# Patient Record
Sex: Male | Born: 1945
Health system: Southern US, Community
[De-identification: ages and names within clinical notes are randomized; demographics above are authoritative.]

## PROBLEM LIST (undated history)

## (undated) DIAGNOSIS — I639 Cerebral infarction, unspecified: Secondary | ICD-10-CM

## (undated) DIAGNOSIS — C61 Malignant neoplasm of prostate: Secondary | ICD-10-CM

## (undated) DIAGNOSIS — K5790 Diverticulosis of intestine, part unspecified, without perforation or abscess without bleeding: Secondary | ICD-10-CM

## (undated) DIAGNOSIS — I69351 Hemiplegia and hemiparesis following cerebral infarction affecting right dominant side: Secondary | ICD-10-CM

## (undated) DIAGNOSIS — I739 Peripheral vascular disease, unspecified: Secondary | ICD-10-CM

## (undated) DIAGNOSIS — Z720 Tobacco use: Secondary | ICD-10-CM

## (undated) DIAGNOSIS — J90 Pleural effusion, not elsewhere classified: Secondary | ICD-10-CM

## (undated) DIAGNOSIS — Z1612 Extended spectrum beta lactamase (ESBL) resistance: Secondary | ICD-10-CM

## (undated) DIAGNOSIS — I2510 Atherosclerotic heart disease of native coronary artery without angina pectoris: Secondary | ICD-10-CM

## (undated) DIAGNOSIS — K219 Gastro-esophageal reflux disease without esophagitis: Secondary | ICD-10-CM

## (undated) DIAGNOSIS — F411 Generalized anxiety disorder: Secondary | ICD-10-CM

## (undated) DIAGNOSIS — I610 Nontraumatic intracerebral hemorrhage in hemisphere, subcortical: Secondary | ICD-10-CM

## (undated) DIAGNOSIS — I4891 Unspecified atrial fibrillation: Secondary | ICD-10-CM

## (undated) DIAGNOSIS — I214 Non-ST elevation (NSTEMI) myocardial infarction: Secondary | ICD-10-CM

## (undated) DIAGNOSIS — K250 Acute gastric ulcer with hemorrhage: Secondary | ICD-10-CM

## (undated) DIAGNOSIS — E785 Hyperlipidemia, unspecified: Secondary | ICD-10-CM

## (undated) DIAGNOSIS — I38 Endocarditis, valve unspecified: Secondary | ICD-10-CM

## (undated) DIAGNOSIS — E43 Unspecified severe protein-calorie malnutrition: Secondary | ICD-10-CM

## (undated) DIAGNOSIS — K449 Diaphragmatic hernia without obstruction or gangrene: Secondary | ICD-10-CM

## (undated) DIAGNOSIS — F69 Unspecified disorder of adult personality and behavior: Secondary | ICD-10-CM

## (undated) DIAGNOSIS — I69391 Dysphagia following cerebral infarction: Secondary | ICD-10-CM

## (undated) DIAGNOSIS — J9601 Acute respiratory failure with hypoxia: Secondary | ICD-10-CM

## (undated) DIAGNOSIS — I10 Essential (primary) hypertension: Secondary | ICD-10-CM

## (undated) DIAGNOSIS — G9340 Encephalopathy, unspecified: Secondary | ICD-10-CM

## (undated) DIAGNOSIS — N400 Enlarged prostate without lower urinary tract symptoms: Secondary | ICD-10-CM

## (undated) DIAGNOSIS — I69320 Aphasia following cerebral infarction: Secondary | ICD-10-CM

## (undated) HISTORY — DX: Unspecified atrial fibrillation: I48.91

## (undated) HISTORY — PX: APPENDECTOMY: SHX54

## (undated) HISTORY — DX: Pleural effusion, not elsewhere classified: J90

## (undated) HISTORY — DX: Endocarditis, valve unspecified: I38

## (undated) HISTORY — DX: Benign prostatic hyperplasia without lower urinary tract symptoms: N40.0

## (undated) HISTORY — PX: BACK SURGERY: SHX140

---

## 2000-03-20 ENCOUNTER — Inpatient Hospital Stay (HOSPITAL_COMMUNITY): Admission: AD | Admit: 2000-03-20 | Discharge: 2000-03-23 | Payer: Self-pay | Admitting: Cardiology

## 2000-03-29 ENCOUNTER — Ambulatory Visit (HOSPITAL_COMMUNITY): Admission: RE | Admit: 2000-03-29 | Discharge: 2000-03-30 | Payer: Self-pay | Admitting: Cardiology

## 2000-11-11 ENCOUNTER — Inpatient Hospital Stay (HOSPITAL_COMMUNITY): Admission: RE | Admit: 2000-11-11 | Discharge: 2000-11-14 | Payer: Self-pay | Admitting: *Deleted

## 2007-02-01 ENCOUNTER — Ambulatory Visit: Admission: RE | Admit: 2007-02-01 | Discharge: 2007-05-02 | Payer: Self-pay | Admitting: Radiation Oncology

## 2012-08-13 ENCOUNTER — Inpatient Hospital Stay (HOSPITAL_COMMUNITY)
Admission: AD | Admit: 2012-08-13 | Discharge: 2012-08-26 | DRG: 234 | Disposition: A | Discharge status: Home Health | Payer: Medicare Other | Source: Other Acute Inpatient Hospital | Attending: Thoracic Surgery (Cardiothoracic Vascular Surgery) | Admitting: Thoracic Surgery (Cardiothoracic Vascular Surgery)

## 2012-08-13 ENCOUNTER — Encounter (HOSPITAL_COMMUNITY): Payer: Self-pay | Admitting: Physician Assistant

## 2012-08-13 DIAGNOSIS — D696 Thrombocytopenia, unspecified: Secondary | ICD-10-CM | POA: Diagnosis not present

## 2012-08-13 DIAGNOSIS — Z9861 Coronary angioplasty status: Secondary | ICD-10-CM

## 2012-08-13 DIAGNOSIS — I4891 Unspecified atrial fibrillation: Secondary | ICD-10-CM | POA: Diagnosis not present

## 2012-08-13 DIAGNOSIS — M810 Age-related osteoporosis without current pathological fracture: Secondary | ICD-10-CM | POA: Diagnosis present

## 2012-08-13 DIAGNOSIS — F17200 Nicotine dependence, unspecified, uncomplicated: Secondary | ICD-10-CM | POA: Diagnosis present

## 2012-08-13 DIAGNOSIS — K449 Diaphragmatic hernia without obstruction or gangrene: Secondary | ICD-10-CM | POA: Diagnosis present

## 2012-08-13 DIAGNOSIS — R404 Transient alteration of awareness: Secondary | ICD-10-CM | POA: Diagnosis not present

## 2012-08-13 DIAGNOSIS — E782 Mixed hyperlipidemia: Secondary | ICD-10-CM | POA: Diagnosis present

## 2012-08-13 DIAGNOSIS — I214 Non-ST elevation (NSTEMI) myocardial infarction: Principal | ICD-10-CM | POA: Diagnosis present

## 2012-08-13 DIAGNOSIS — I10 Essential (primary) hypertension: Secondary | ICD-10-CM | POA: Diagnosis present

## 2012-08-13 DIAGNOSIS — E785 Hyperlipidemia, unspecified: Secondary | ICD-10-CM | POA: Diagnosis present

## 2012-08-13 DIAGNOSIS — Z951 Presence of aortocoronary bypass graft: Secondary | ICD-10-CM

## 2012-08-13 DIAGNOSIS — R32 Unspecified urinary incontinence: Secondary | ICD-10-CM | POA: Diagnosis not present

## 2012-08-13 DIAGNOSIS — IMO0001 Reserved for inherently not codable concepts without codable children: Secondary | ICD-10-CM

## 2012-08-13 DIAGNOSIS — K219 Gastro-esophageal reflux disease without esophagitis: Secondary | ICD-10-CM | POA: Diagnosis present

## 2012-08-13 DIAGNOSIS — E119 Type 2 diabetes mellitus without complications: Secondary | ICD-10-CM | POA: Diagnosis present

## 2012-08-13 DIAGNOSIS — E876 Hypokalemia: Secondary | ICD-10-CM | POA: Diagnosis not present

## 2012-08-13 DIAGNOSIS — R079 Chest pain, unspecified: Secondary | ICD-10-CM

## 2012-08-13 DIAGNOSIS — K560 Paralytic ileus: Secondary | ICD-10-CM | POA: Diagnosis not present

## 2012-08-13 DIAGNOSIS — R5381 Other malaise: Secondary | ICD-10-CM | POA: Diagnosis not present

## 2012-08-13 DIAGNOSIS — I059 Rheumatic mitral valve disease, unspecified: Secondary | ICD-10-CM | POA: Diagnosis present

## 2012-08-13 DIAGNOSIS — J90 Pleural effusion, not elsewhere classified: Secondary | ICD-10-CM | POA: Diagnosis not present

## 2012-08-13 DIAGNOSIS — I2510 Atherosclerotic heart disease of native coronary artery without angina pectoris: Secondary | ICD-10-CM | POA: Diagnosis present

## 2012-08-13 HISTORY — DX: Gastro-esophageal reflux disease without esophagitis: K21.9

## 2012-08-13 HISTORY — DX: Essential (primary) hypertension: I10

## 2012-08-13 HISTORY — DX: Hyperlipidemia, unspecified: E78.5

## 2012-08-13 HISTORY — DX: Tobacco use: Z72.0

## 2012-08-13 HISTORY — DX: Peripheral vascular disease, unspecified: I73.9

## 2012-08-13 HISTORY — DX: Diaphragmatic hernia without obstruction or gangrene: K44.9

## 2012-08-13 HISTORY — DX: Malignant neoplasm of prostate: C61

## 2012-08-13 HISTORY — DX: Atherosclerotic heart disease of native coronary artery without angina pectoris: I25.10

## 2012-08-13 LAB — GLUCOSE, CAPILLARY
Glucose-Capillary: 99 mg/dL (ref 70–99)
Glucose-Capillary: 155 mg/dL — ABNORMAL HIGH (ref 70–99)

## 2012-08-13 LAB — MRSA PCR SCREENING: MRSA by PCR: NEGATIVE

## 2012-08-13 MED ORDER — ONDANSETRON HCL 4 MG/2ML IJ SOLN: 4.0000 mg | Freq: Four times a day (QID) | INTRAMUSCULAR | Status: DC | PRN

## 2012-08-13 MED ORDER — INSULIN ASPART 100 UNIT/ML ~~LOC~~ SOLN: 0.0000 [IU] | Freq: Every day | SUBCUTANEOUS | Status: DC

## 2012-08-13 MED ORDER — SODIUM CHLORIDE 0.9 % IV SOLN
INTRAVENOUS | Status: DC
  Administered 2012-08-14: 05:00:00 via INTRAVENOUS

## 2012-08-13 MED ORDER — SODIUM CHLORIDE 0.9 % IJ SOLN: 3.0000 mL | INTRAMUSCULAR | Status: DC | PRN

## 2012-08-13 MED ORDER — ASPIRIN 81 MG PO CHEW
324.0000 mg | CHEWABLE_TABLET | ORAL | Status: AC
  Administered 2012-08-14: 324 mg via ORAL
  Filled 2012-08-13: qty 4

## 2012-08-13 MED ORDER — ATORVASTATIN CALCIUM 80 MG PO TABS
80.0000 mg | ORAL_TABLET | Freq: Every day | ORAL | Status: DC
  Administered 2012-08-13 – 2012-08-25 (×9): 80 mg via ORAL
  Filled 2012-08-13 (×14): qty 1

## 2012-08-13 MED ORDER — METOPROLOL TARTRATE 25 MG PO TABS
25.0000 mg | ORAL_TABLET | Freq: Two times a day (BID) | ORAL | Status: DC
  Administered 2012-08-13: 25 mg via ORAL
  Filled 2012-08-13 (×3): qty 1

## 2012-08-13 MED ORDER — ACETAMINOPHEN 325 MG PO TABS
650.0000 mg | ORAL_TABLET | ORAL | Status: DC | PRN
  Administered 2012-08-14 – 2012-08-15 (×2): 650 mg via ORAL
  Filled 2012-08-13 (×2): qty 2

## 2012-08-13 MED ORDER — NITROGLYCERIN IN D5W 200-5 MCG/ML-% IV SOLN
3.0000 ug/min | INTRAVENOUS | Status: DC
  Administered 2012-08-13: 5 ug/min via INTRAVENOUS

## 2012-08-13 MED ORDER — INSULIN ASPART 100 UNIT/ML ~~LOC~~ SOLN
0.0000 [IU] | Freq: Three times a day (TID) | SUBCUTANEOUS | Status: DC
  Administered 2012-08-14 – 2012-08-15 (×2): 2 [IU] via SUBCUTANEOUS

## 2012-08-13 MED ORDER — NITROGLYCERIN 0.4 MG SL SUBL: 0.4000 mg | SUBLINGUAL_TABLET | SUBLINGUAL | Status: DC | PRN

## 2012-08-13 MED ORDER — LISINOPRIL 10 MG PO TABS
10.0000 mg | ORAL_TABLET | Freq: Every day | ORAL | Status: DC
  Filled 2012-08-13: qty 1

## 2012-08-13 MED ORDER — ASPIRIN EC 81 MG PO TBEC
81.0000 mg | DELAYED_RELEASE_TABLET | Freq: Every day | ORAL | Status: DC
  Administered 2012-08-15: 81 mg via ORAL
  Filled 2012-08-13 (×3): qty 1

## 2012-08-13 MED ORDER — INSULIN GLARGINE 100 UNIT/ML ~~LOC~~ SOLN
40.0000 [IU] | Freq: Every day | SUBCUTANEOUS | Status: DC
  Administered 2012-08-13: 40 [IU] via SUBCUTANEOUS
  Administered 2012-08-14: 21:00:00 via SUBCUTANEOUS
  Administered 2012-08-15: 40 [IU] via SUBCUTANEOUS

## 2012-08-13 MED ORDER — SODIUM CHLORIDE 0.9 % IJ SOLN
3.0000 mL | Freq: Two times a day (BID) | INTRAMUSCULAR | Status: DC
  Administered 2012-08-13: 3 mL via INTRAVENOUS

## 2012-08-13 MED ORDER — SODIUM CHLORIDE 0.9 % IV SOLN: 250.0000 mL | INTRAVENOUS | Status: DC | PRN

## 2012-08-13 MED ORDER — HEPARIN (PORCINE) IN NACL 100-0.45 UNIT/ML-% IJ SOLN
1200.0000 [IU]/h | INTRAMUSCULAR | Status: DC
  Administered 2012-08-14: 1200 [IU]/h via INTRAVENOUS
  Filled 2012-08-13: qty 250

## 2012-08-13 MED ORDER — HEPARIN BOLUS VIA INFUSION
1500.0000 [IU] | Freq: Once | INTRAVENOUS | Status: AC
  Administered 2012-08-13: 1500 [IU] via INTRAVENOUS
  Filled 2012-08-13: qty 1500

## 2012-08-13 MED ORDER — TAMSULOSIN HCL 0.4 MG PO CAPS
0.4000 mg | ORAL_CAPSULE | Freq: Every day | ORAL | Status: DC
  Administered 2012-08-14 – 2012-08-15 (×2): 0.4 mg via ORAL
  Filled 2012-08-13 (×3): qty 1

## 2012-08-13 MED ORDER — SODIUM CHLORIDE 0.9 % IV SOLN
INTRAVENOUS | Status: DC
  Administered 2012-08-13: 16:00:00 via INTRAVENOUS

## 2012-08-13 NOTE — Progress Notes (Addendum)
ANTICOAGULATION CONSULT NOTE - Initial Consult  Pharmacy Consult for heparin Indication: chest pain/ACS  No Known Allergies  Patient Measurements: Height: 5\' 9"  (175.3 cm) Weight: 204 lb 12.9 oz (92.9 kg) IBW/kg (Calculated) : 70.7  Heparin Dosing Weight: 90 kg  Vital Signs: Temp: 97.8 F (36.6 C) (09/02 1454) Temp src: Oral (09/02 1454) BP: 138/84 mmHg (09/02 1515) Pulse Rate: 85  (09/02 1515)  Labs: No results found for this basename: HGB:2,HCT:3,PLT:3,APTT:3,LABPROT:3,INR:3,HEPARINUNFRC:3,CREATININE:3,CKTOTAL:3,CKMB:3,TROPONINI:3 in the last 72 hours  CrCl is unknown because no creatinine reading has been taken.   Medical History: Past Medical History  Diagnosis Date  . CAD (coronary artery disease)   . HTN (hypertension)   . HLD (hyperlipidemia)   . GERD (gastroesophageal reflux disease)   . Hiatal hernia   . Prostate cancer     Status post radiation treatment for  . Tobacco abuse   . Myocardial infarction   . Diabetes mellitus   . Peripheral vascular disease   . Anginal pain     Medications:  Scheduled:    . aspirin EC  81 mg Oral Daily  . atorvastatin  80 mg Oral QHS  . insulin aspart  0-15 Units Subcutaneous TID WC  . insulin aspart  0-5 Units Subcutaneous QHS  . insulin glargine  40 Units Subcutaneous QHS  . lisinopril  10 mg Oral Daily  . metoprolol tartrate  25 mg Oral BID  . Tamsulosin HCl  0.4 mg Oral QPC supper    Assessment: 66 yo male transferred from [facility] with NSTEMI. Pharmacy consulted to manage heparin. Pt received heparin 5000 unit bolus at ~ 11:00 at [facility] and heparin has been infusing at 1000 units/hr. Baseline labs from [facility] include INR 1.1, Hgb 16.8, Plt 179.   Goal of Therapy:  Heparin level 0.3-0.7 units/ml Monitor platelets by anticoagulation protocol: Yes   Plan:  1. Continue IV heparin at 1000 units/hr.  2. Heparin level at 17:00.  3. Daily CBC, heparin level.  Emeline Gins 08/13/2012,4:21  PM   Addendum: Heparin level (0.23) is below-goal on 1000 units/hr. Heparin IV bolus of 1500 units x 1, then increase IV infusion to 1200 units/hr. Heparin level in 6 hours.   Lorre Munroe, PharmD 08/13/12, 05:45 PM.

## 2012-08-13 NOTE — H&P (Signed)
History and Physical  Patient ID: YAXIEL MINNIE MRN: [id number], SOB: 1946-08-20 66 y.o. Date of Encounter: 08/13/2012, 3:22 PM  Primary Physician: Selinda Flavin, MD   HPI: 66 year-old male, history of CAD, status post NSTEMI treated with PCI/stenting 100% distal RCA, April 2001, status post acute inferior MI/emergent thrombectomy/stenting 100% mid RCA, November 2001, with residual nonobstructive left sided disease, normal LVF, HTN, HLD, and IDDM. Patient has not had any regular cardiology followup, since being treated for his MI in 2001.  Patient presented to [facility] ED earlier this morning with complaint of recurrent angina pectoris over the past few days. His first episode occurred this past Wednesday, after he had changed a tire. The episode was associated with nausea and vomiting, and the anterior chest discomfort was reminiscent of his MI presentation, although not as severe and with no associated LUE radiation. This morning he had a similar episode of chest discomfort, although without the associated nausea/vomiting, but after having had a biscuit for breakfast earlier this morning. He did not have any NTG at home, but did take some TUMS, believing this might relieve his indigestion. His symptoms persisted, and he was taken to the ED by his wife. He was treated with 4 baby aspirin, 3 NTG tablets, nitro paste, and subsequently placed on both intravenous NTG and heparin. Initial cardiac markers were abnormal with a troponin I of 0.20. 12 lead EKG indicated NSR with evidence of old inferior MI, but no acute changes.  Patient is currently hemodynamically stable and pain-free.  Past Medical History  Diagnosis Date  . CAD (coronary artery disease)   . HTN (hypertension)   . HLD (hyperlipidemia)   . GERD (gastroesophageal reflux disease)   . Hiatal hernia   . Prostate cancer     Status post radiation treatment for  . Tobacco abuse      Past Surgical History  Procedure Date  .  Back surgery   . Appendectomy      Home Meds: Prior to Admission medications   Medication Sig Start Date End Date Taking? Authorizing Provider  atorvastatin (LIPITOR) 10 MG tablet Take 10 mg by mouth at bedtime.   Yes Historical Provider, MD  insulin glargine (LANTUS) 100 UNIT/ML injection Inject 40 Units into the skin at bedtime.   Yes Historical Provider, MD  lisinopril (PRINIVIL,ZESTRIL) 10 MG tablet Take 10 mg by mouth daily.   Yes Historical Provider, MD  metFORMIN (GLUCOPHAGE-XR) 500 MG 24 hr tablet Take 1,000 mg by mouth 2 (two) times daily.    Yes Historical Provider, MD  Tamsulosin HCl (FLOMAX) 0.4 MG CAPS Take 0.4 mg by mouth daily after supper.   Yes Historical Provider, MD    Allergies: No Known Allergies  History   Social History  . Marital Status: Married    Spouse Name: N/A    Number of Children: N/A  . Years of Education: N/A   Occupational History  . Not on file.   Social History Main Topics  . Smoking status: Not on file  . Smokeless tobacco: Not on file  . Alcohol Use: Not on file  . Drug Use: Not on file  . Sexually Active: Not on file   Other Topics Concern  . Not on file   Social History Narrative  . No narrative on file     FH: Noncontributory for premature CAD  Review of Systems: General: negative for chills, fever, night sweats or weight changes.  Cardiovascular: negative for chest pain, edema, orthopnea,  palpitations, paroxysmal nocturnal dyspnea, shortness of breath or dyspnea on exertion Dermatological: negative for rash Respiratory: negative for cough or wheezing Urologic: negative for hematuria Abdominal: negative for nausea, vomiting, diarrhea, bright red blood per rectum, melena, or hematemesis Neurologic: negative for visual changes, syncope, or dizziness All other systems reviewed and are otherwise negative except as noted above.  Labs:   No results found for this basename: WBC, HGB, HCT, MCV, PLT   No results found for this  basename: NA,K,CL,CO2,BUN,CREATININE,CALCIUM,LABALBU,PROT,BILITOT,ALKPHOS,ALT,AST,GLUCOSE in the last 168 hours No results found for this basename: CKTOTAL:4,CKMB:4,TROPONINI:4 in the last 72 hours No results found for this basename: CHOL, HDL, LDLCALC, TRIG   No results found for this basename: DDIMER    Radiology/Studies:  No results found.   EKG: NSR at 83 bpm; normal axis; old IMI; no acute changes  Physical Exam:  Blood pressure 138/84, pulse 85, temperature 97.8 F (36.6 C), temperature source Oral, resp. rate 22, height 5\' 9"  (1.753 m), weight 204 lb 12.9 oz (92.9 kg), SpO2 97.00%. General: Well developed, well nourished, in no acute distress. Head: Normocephalic, atraumatic, sclera non-icteric, no xanthomas, nares are without discharge.  Neck: Negative for carotid bruits. JVD not elevated. Lungs: Clear bilaterally to auscultation without wheezes, rales, or rhonchi. Breathing is unlabored. Heart: RRR with S1 S2. No murmurs, rubs, or gallops appreciated. Abdomen: Soft, non-tender, non-distended with normoactive bowel sounds. No hepatomegaly. No rebound/guarding. No obvious abdominal masses. Msk:  Strength and tone appear normal for age. Extremities: No clubbing or cyanosis. No edema.  Distal pedal pulses are 2+ and equal bilaterally. Neuro: Alert and oriented X 3. Moves all extremities spontaneously. Psych:  Responds to questions appropriately with a normal affect.     ASSESSMENT AND PLAN:  1 NSTEMI  2 CAD  - NSTEMI/stenting 100% distal RCA, 03/2000  - Acute inferior MI/emergent thrombectomy/stenting 100% mid RCA, 10/2000  - EF 51%  3 HTN  4 HLD  5 IDDM  PLAN: Recommendation is to proceed with diagnostic coronary angiography and probable PCI in the a.m. Patient presents with typical symptoms and abnormal cardiac markers, consistent with NSTEMI. He is currently hemodynamically stable and pain-free. Plan is to continue current medication regimen with IV NTG and heparin,  in addition to ASA, beta blocker, and statin. Patient will be covered with SSI, in addition to home dose Lantus. Metformin will be placed on hold. Risks/benefits of the procedure were reviewed, in conjunction with Dr. Valera Castle.   Signed, SERPE, EUGENE PA-C 08/13/2012, 3:22 PM   I have taken a history, reviewed medications, allergies, PMH, SH, FH, and reviewed ROS and examined the patient.  I agree with the assessment and plan. Discussed with patient and wife. Will not load with additional antiplatelet drug other than ASA.  Joliana Claflin C. Daleen Squibb, MD, [facility] Pager:  [phone]

## 2012-08-13 NOTE — Progress Notes (Signed)
CRITICAL VALUE ALERT  Critical value received:  Troponin 0.49 Date of notification:  [id number]  Time of notification:  1755  Critical value read back:yes  Nurse who received alert:  Deneise Lever, RN  MD notified (1st page):Dr. Valera Castle Time of first page:  1805  MD notified (2nd page):  Time of second page:  Responding MD: Dr. Valera Castle  Time MD responded:  [phone]

## 2012-08-14 ENCOUNTER — Encounter (HOSPITAL_COMMUNITY)
Admission: AD | Disposition: A | Discharge status: Home Health | Payer: Self-pay | Source: Other Acute Inpatient Hospital | Attending: Thoracic Surgery (Cardiothoracic Vascular Surgery)

## 2012-08-14 ENCOUNTER — Other Ambulatory Visit: Payer: Self-pay | Admitting: *Deleted

## 2012-08-14 DIAGNOSIS — I2510 Atherosclerotic heart disease of native coronary artery without angina pectoris: Secondary | ICD-10-CM

## 2012-08-14 HISTORY — PX: LEFT HEART CATHETERIZATION WITH CORONARY ANGIOGRAM: SHX5451

## 2012-08-14 LAB — CBC
Hemoglobin: 16.3 g/dL (ref 13.0–17.0)
RBC: 5.28 MIL/uL (ref 4.22–5.81)

## 2012-08-14 LAB — GLUCOSE, CAPILLARY

## 2012-08-14 LAB — HEPARIN LEVEL (UNFRACTIONATED)
Heparin Unfractionated: 0.43 IU/mL (ref 0.30–0.70)
Heparin Unfractionated: 0.32 IU/mL (ref 0.30–0.70)

## 2012-08-14 LAB — LIPID PANEL
Cholesterol: 141 mg/dL (ref 0–200)
HDL: 33 mg/dL — ABNORMAL LOW (ref >39)
Total CHOL/HDL Ratio: 4.3 RATIO

## 2012-08-14 SURGERY — LEFT HEART CATHETERIZATION WITH CORONARY ANGIOGRAM
Anesthesia: LOCAL

## 2012-08-14 MED ORDER — METOPROLOL TARTRATE 25 MG PO TABS
25.0000 mg | ORAL_TABLET | Freq: Two times a day (BID) | ORAL | Status: DC
  Administered 2012-08-14 – 2012-08-15 (×4): 25 mg via ORAL
  Filled 2012-08-14 (×4): qty 1

## 2012-08-14 MED ORDER — FENTANYL CITRATE 0.05 MG/ML IJ SOLN
INTRAMUSCULAR | Status: AC
  Filled 2012-08-14: qty 2

## 2012-08-14 MED ORDER — LISINOPRIL 10 MG PO TABS
10.0000 mg | ORAL_TABLET | Freq: Every day | ORAL | Status: DC
  Administered 2012-08-14 – 2012-08-15 (×2): 10 mg via ORAL
  Filled 2012-08-14 (×3): qty 1

## 2012-08-14 MED ORDER — MIDAZOLAM HCL 2 MG/2ML IJ SOLN
INTRAMUSCULAR | Status: AC
  Filled 2012-08-14: qty 2

## 2012-08-14 MED ORDER — HEPARIN (PORCINE) IN NACL 2-0.9 UNIT/ML-% IJ SOLN
INTRAMUSCULAR | Status: AC
  Filled 2012-08-14: qty 2000

## 2012-08-14 MED ORDER — NITROGLYCERIN 0.2 MG/ML ON CALL CATH LAB
INTRAVENOUS | Status: AC
  Filled 2012-08-14: qty 1

## 2012-08-14 MED ORDER — VERAPAMIL HCL 2.5 MG/ML IV SOLN
INTRAVENOUS | Status: AC
  Filled 2012-08-14: qty 2

## 2012-08-14 MED ORDER — SODIUM CHLORIDE 0.9 % IV SOLN: 1.0000 mL/kg/h | INTRAVENOUS | Status: AC

## 2012-08-14 MED ORDER — ALPRAZOLAM 0.5 MG PO TABS
0.5000 mg | ORAL_TABLET | Freq: Three times a day (TID) | ORAL | Status: DC | PRN
  Administered 2012-08-14: 0.5 mg via ORAL
  Filled 2012-08-14: qty 1

## 2012-08-14 MED ORDER — HEPARIN (PORCINE) IN NACL 100-0.45 UNIT/ML-% IJ SOLN
1400.0000 [IU]/h | INTRAMUSCULAR | Status: DC
  Filled 2012-08-14 (×2): qty 250

## 2012-08-14 MED ORDER — LIDOCAINE HCL (PF) 1 % IJ SOLN
INTRAMUSCULAR | Status: AC
  Filled 2012-08-14: qty 30

## 2012-08-14 MED ORDER — ALUM & MAG HYDROXIDE-SIMETH 200-200-20 MG/5ML PO SUSP
30.0000 mL | Freq: Four times a day (QID) | ORAL | Status: DC | PRN
  Administered 2012-08-14 (×2): 30 mL via ORAL
  Filled 2012-08-14 (×2): qty 30

## 2012-08-14 MED ORDER — HEPARIN SODIUM (PORCINE) 1000 UNIT/ML IJ SOLN
INTRAMUSCULAR | Status: AC
  Filled 2012-08-14: qty 1

## 2012-08-14 MED FILL — Heparin Sodium (Porcine) 100 Unt/ML in Sodium Chloride 0.45%: INTRAMUSCULAR | Qty: 250 | Status: AC

## 2012-08-14 MED FILL — Nitroglycerin IV Soln 200 MCG/ML in D5W: INTRAVENOUS | Qty: 250 | Status: AC

## 2012-08-14 NOTE — CV Procedure (Signed)
   Cardiac Catheterization Procedure Note  Name: [name] MRN: [id number] DOB: 1946/04/19  Procedure: Left Heart Cath, Selective Coronary Angiography, LV angiography  Indication: 66 year old white male with history of diabetes and hyperlipidemia presents with a non-ST elevation myocardial infarction. He had remote stenting of the mid and distal RCA in 2001.   Procedural Details: The right wrist was prepped, draped, and anesthetized with 1% lidocaine. Using the modified Seldinger technique, a 5 French sheath was introduced into the right radial artery. 3 mg of verapamil was administered through the sheath, weight-based unfractionated heparin was administered intravenously. Standard Judkins catheters were used for selective coronary angiography and left ventriculography. Catheter exchanges were performed over an exchange length guidewire. There were no immediate procedural complications. A TR band was used for radial hemostasis at the completion of the procedure.  The patient was transferred to the post catheterization recovery area for further monitoring.  Procedural Findings: Hemodynamics: AO 97/59 with a mean of 76 mmHg LV 97/21 mmHg  Coronary angiography: Coronary dominance: right  Left mainstem: The left main coronary is mildly calcified. There is diffuse irregularities up to 10-20%.  Left anterior descending (LAD): The LAD is a large vessel that extends to the apex. It is a focal 90-95% stenosis in the proximal vessel. The distal LAD is small in caliber and diffusely diseased up to 50-70%.  Left circumflex (LCx): The left circumflex coronary gives rise to a large first marginal branch and then continues in the AV groove giving off 2 smaller marginal branches. There is a 60-70% narrowing after the first marginal branch. There is then a 95% stenosis in the distal circumflex.  Right coronary artery (RCA): The right coronary is a large dominant vessel. There is a 90% stenosis proximally  followed by a long 95% stenosis proximal to the stent. The stent in the mid vessel is occluded. There is faint collateral flow distally. There is left to right collateral flow.  Left ventriculography: Left ventricular systolic function is abnormal, LVEF is estimated at 35-40 %, there is severe inferior wall hypokinesis with global hypokinesis there is mild to moderate mitral regurgitation   Final Conclusions:   1. Severe three-vessel obstructive coronary disease. 2. Moderate left ventricular dysfunction.  Recommendations: CT surgery consultation for CABG.  Theron Arista [facility] 08/14/2012, 10:23 AM

## 2012-08-14 NOTE — Progress Notes (Signed)
Spoke with pt/family in room. He is resting comfortably, no questions [id number]. Advised pt to call with questions/concerns and contact RN immediately if pain/problems.  Patient seen and examined and history reviewed. Agree with above findings and plan.  Theron Arista [facility] 08/14/2012 9:54 AM

## 2012-08-14 NOTE — Progress Notes (Signed)
ANTICOAGULATION CONSULT NOTE - Follow Up Consult  Pharmacy Consult for heparin Indication: chest pain/ACS  Labs:  Basename 08/13/12 2313 08/13/12 1653  HGB -- --  HCT -- --  PLT -- --  APTT -- --  LABPROT -- --  INR -- --  HEPARINUNFRC 0.43 0.23*  CREATININE -- --  CKTOTAL -- --  CKMB -- --  TROPONINI -- 0.49*    Assessment/Plan:  66yo male now therapeutic on heparin after rate increase.  Will continue gtt at current rate and confirm stable with am labs.  Colleen Can PharmD BCPS 08/14/2012,12:39 AM

## 2012-08-14 NOTE — Care Management Note (Unsigned)
    Page 1 of 2   08/24/2012     2:17:27 PM   CARE MANAGEMENT NOTE 08/24/2012  Patient:  [name], [name]   Account Number:  [id number]  Date Initiated:  08/14/2012  Documentation initiated by:  Junius Creamer  Subjective/Objective Assessment:   adm w mi     Action/Plan:   lives w wife   Anticipated DC Date:  08/21/2012   Anticipated DC Plan:  HOME W HOME HEALTH SERVICES      DC Planning Services  CM consult      PAC Choice  DURABLE MEDICAL EQUIPMENT  HOME HEALTH   Choice offered to / List presented to:  C-1 Patient   DME arranged  [name]      DME agency  [facility]     HH arranged  HH-1 RN  HH-2 PT      [facility] agency  [facility]   Status of service:  In process, will continue to follow Medicare Important Message given?   (If response is "NO", the following Medicare IM given date fields will be blank) Date Medicare IM given:   Date Additional Medicare IM given:    Discharge Disposition:    Per UR Regulation:  Reviewed for med. necessity/level of care/duration of stay  If discussed at Long Length of Stay Meetings, dates discussed:    Comments:  08/24/12  1414  Tanner Pepitone SIMMONS RN, [name] [phone] REFERRAL PLACED TO [name] WITH [facility] FO RW TO BE DELIVERED TO ROOM AS ORDERED;  REFERRAL PLACED TO [name] WITH [facility] FOR HHRN/PT PER CHOICE;  SOC DATE: WITHIN 24 - 48HRS POST D/C; PA-  PLEASE ORDER HHRN/ PT AR D/C.  08/24/12  1144  [name] SIMMONS RN, [name] [phone] MET WITH PT AND WIFE AT BEDSIDE TO DISCUSS D/C PLANNING; PT WOULD BENEFIT FROM HHRN/PT SERVICES POST D/C; PROVIDED HH SERVICE LIST FOR THEM TO REVIEW AND SELECT AGENCY; NCM WILL FOLLOW.   08/17/12 Tanner AMERSON,RN,[name] 1550 PT S/P CABG X 4 ON 08/16/12.  PTA, PT INDEPENDENT, LIVES WITH SPOUSE AND SON.  FAMILY TO PROVIDE 24HR CARE AT DC.  WILL FOLLOW FOR HOME NEEDS AS PT PROGRESSES.  9/3 14:40p Tanner dowell rn,bsn [phone]

## 2012-08-14 NOTE — Interval H&P Note (Signed)
History and Physical Interval Note:  08/14/2012 9:53 AM  [name]  has presented today for surgery, with the diagnosis of cp  The various methods of treatment have been discussed with the patient and family. After consideration of risks, benefits and other options for treatment, the patient has consented to  Procedure(s) (LRB): LEFT HEART CATHETERIZATION WITH CORONARY ANGIOGRAM (N/A) as a surgical intervention .  The patient's history has been reviewed, patient examined, no change in status, stable for surgery.  I have reviewed the patient's chart and labs.  Questions were answered to the patient's satisfaction.     Theron Arista [facility] 08/14/2012 9:53 AM

## 2012-08-14 NOTE — Progress Notes (Signed)
Pt restless again since he woke up after Xanax.  Still complaining of feeling irritable.  Trying to get up and walk around.  Is not steady on his feet at this point.  Also states he has a little bit of a headache and nausea/gas.  [place], [place] notified. Will continue to monitor.  Tylenol given for mild headache and milk of mag. Given for indigestion.

## 2012-08-14 NOTE — Progress Notes (Signed)
ANTICOAGULATION CONSULT NOTE - Follow Up Consult  Pharmacy Consult for heparin Indication: chest pain/ACS  No Known Allergies  Patient Measurements: Height: 5\' 9"  (175.3 cm) Weight: 204 lb 12.9 oz (92.9 kg) IBW/kg (Calculated) : 70.7    Vital Signs: Temp: 98.3 F (36.8 C) (09/03 1145) Temp src: Oral (09/03 1145) BP: 117/70 mmHg (09/03 1145) Pulse Rate: 70  (09/03 1145)  Labs:  Basename 08/14/12 0325 08/13/12 2313 08/13/12 1653  HGB 16.3 -- --  HCT 47.2 -- --  PLT 182 -- --  APTT -- -- --  LABPROT -- -- --  INR -- -- --  HEPARINUNFRC 0.32 0.43 0.23*  CREATININE -- -- --  CKTOTAL -- -- --  CKMB -- -- --  TROPONINI 0.52* 0.68* 0.49*    CrCl is unknown because no creatinine reading has been taken.   Medications:  Scheduled:    . aspirin  324 mg Oral Pre-Cath  . aspirin EC  81 mg Oral Daily  . atorvastatin  80 mg Oral QHS  . fentaNYL      . heparin      . heparin      . heparin  1,500 Units Intravenous Once  . insulin aspart  0-15 Units Subcutaneous TID WC  . insulin aspart  0-5 Units Subcutaneous QHS  . insulin glargine  40 Units Subcutaneous QHS  . lidocaine      . lisinopril  10 mg Oral Daily  . metoprolol tartrate  25 mg Oral BID  . midazolam      . nitroGLYCERIN      . Tamsulosin HCl  0.4 mg Oral QPC supper  . verapamil      . DISCONTD: lisinopril  10 mg Oral Daily  . DISCONTD: metoprolol tartrate  25 mg Oral BID  . DISCONTD: sodium chloride  3 mL Intravenous Q12H    Assessment: 66 yo male s/p cath with 3 vessel CAD for CABG to continue heparin 8 hrs post sheath removal (sheath removed at ~ 10:15 am). Last heparin rate was 1200 units/hr  Goal of Therapy:  Heparin level 0.3-0.7 units/ml Monitor platelets by anticoagulation protocol: Yes   Plan:  -Will restart heparin at 1200 units/hr  -Heparin level in 6hrs  Harland German, Pharm D 08/14/2012 12:12 PM

## 2012-08-14 NOTE — Progress Notes (Signed)
Pt restless; trying to climb out of bed; states he knows he is confused right now and that he feels irritable.  Flavia Shipper, NP paged. Orders received.  Will continue to monitor.

## 2012-08-15 ENCOUNTER — Inpatient Hospital Stay (HOSPITAL_COMMUNITY): Payer: Medicare Other

## 2012-08-15 DIAGNOSIS — Z01810 Encounter for preprocedural cardiovascular examination: Secondary | ICD-10-CM

## 2012-08-15 DIAGNOSIS — I10 Essential (primary) hypertension: Secondary | ICD-10-CM | POA: Diagnosis present

## 2012-08-15 DIAGNOSIS — I2510 Atherosclerotic heart disease of native coronary artery without angina pectoris: Secondary | ICD-10-CM

## 2012-08-15 DIAGNOSIS — IMO0001 Reserved for inherently not codable concepts without codable children: Secondary | ICD-10-CM | POA: Diagnosis present

## 2012-08-15 DIAGNOSIS — E785 Hyperlipidemia, unspecified: Secondary | ICD-10-CM | POA: Diagnosis present

## 2012-08-15 DIAGNOSIS — I214 Non-ST elevation (NSTEMI) myocardial infarction: Secondary | ICD-10-CM | POA: Diagnosis present

## 2012-08-15 DIAGNOSIS — E782 Mixed hyperlipidemia: Secondary | ICD-10-CM | POA: Diagnosis present

## 2012-08-15 DIAGNOSIS — I517 Cardiomegaly: Secondary | ICD-10-CM

## 2012-08-15 LAB — CBC
HCT: 47.1 % (ref 39.0–52.0)
Hemoglobin: 15.9 g/dL (ref 13.0–17.0)
MCH: 30.6 pg (ref 26.0–34.0)
MCHC: 33.8 g/dL (ref 30.0–36.0)
MCV: 90.8 fL (ref 78.0–100.0)
RBC: 5.19 MIL/uL (ref 4.22–5.81)

## 2012-08-15 LAB — URINALYSIS, ROUTINE W REFLEX MICROSCOPIC
Nitrite: NEGATIVE
Specific Gravity, Urine: 1.019 (ref 1.005–1.030)
Urobilinogen, UA: 1 mg/dL (ref 0.0–1.0)

## 2012-08-15 LAB — GLUCOSE, CAPILLARY
Glucose-Capillary: 75 mg/dL (ref 70–99)
Glucose-Capillary: 123 mg/dL — ABNORMAL HIGH (ref 70–99)

## 2012-08-15 LAB — COMPREHENSIVE METABOLIC PANEL
AST: 15 U/L (ref 0–37)
Albumin: 3.3 g/dL — ABNORMAL LOW (ref 3.5–5.2)
Calcium: 9.8 mg/dL (ref 8.4–10.5)
Chloride: 100 mEq/L (ref 96–112)
Creatinine, Ser: 0.99 mg/dL (ref 0.50–1.35)
Sodium: 133 mEq/L — ABNORMAL LOW (ref 135–145)
Total Bilirubin: 0.9 mg/dL (ref 0.3–1.2)

## 2012-08-15 LAB — HEPARIN LEVEL (UNFRACTIONATED): Heparin Unfractionated: 0.1 IU/mL — ABNORMAL LOW (ref 0.30–0.70)

## 2012-08-15 LAB — ABO/RH: ABO/RH(D): A POS

## 2012-08-15 LAB — APTT: aPTT: 56 seconds — ABNORMAL HIGH (ref 24–37)

## 2012-08-15 LAB — TYPE AND SCREEN

## 2012-08-15 LAB — PROTIME-INR: INR: 1.18 (ref 0.00–1.49)

## 2012-08-15 LAB — URINE MICROSCOPIC-ADD ON

## 2012-08-15 MED ORDER — DOPAMINE-DEXTROSE 3.2-5 MG/ML-% IV SOLN
2.0000 ug/kg/min | INTRAVENOUS | Status: DC
  Filled 2012-08-15: qty 250

## 2012-08-15 MED ORDER — BISACODYL 5 MG PO TBEC
5.0000 mg | DELAYED_RELEASE_TABLET | Freq: Once | ORAL | Status: AC
  Administered 2012-08-15: 5 mg via ORAL
  Filled 2012-08-15: qty 1

## 2012-08-15 MED ORDER — SODIUM BICARBONATE 8.4 % IV SOLN
INTRAVENOUS | Status: AC
  Administered 2012-08-16: 09:00:00
  Filled 2012-08-15: qty 2.5

## 2012-08-15 MED ORDER — TRANEXAMIC ACID (OHS) PUMP PRIME SOLUTION
2.0000 mg/kg | INTRAVENOUS | Status: DC
  Filled 2012-08-15: qty 1.86

## 2012-08-15 MED ORDER — METOPROLOL TARTRATE 12.5 MG HALF TABLET
12.5000 mg | ORAL_TABLET | Freq: Once | ORAL | Status: AC
  Administered 2012-08-16: 12.5 mg via ORAL
  Filled 2012-08-15: qty 1

## 2012-08-15 MED ORDER — ALBUTEROL SULFATE (5 MG/ML) 0.5% IN NEBU
2.5000 mg | INHALATION_SOLUTION | Freq: Once | RESPIRATORY_TRACT | Status: AC
  Administered 2012-08-15: 2.5 mg via RESPIRATORY_TRACT

## 2012-08-15 MED ORDER — MAGNESIUM SULFATE 50 % IJ SOLN
40.0000 meq | INTRAMUSCULAR | Status: DC
  Filled 2012-08-15: qty 10

## 2012-08-15 MED ORDER — TEMAZEPAM 15 MG PO CAPS: 15.0000 mg | ORAL_CAPSULE | Freq: Once | ORAL | Status: AC | PRN

## 2012-08-15 MED ORDER — NITROGLYCERIN IN D5W 200-5 MCG/ML-% IV SOLN
2.0000 ug/min | INTRAVENOUS | Status: DC
  Filled 2012-08-15: qty 250

## 2012-08-15 MED ORDER — HEPARIN (PORCINE) IN NACL 100-0.45 UNIT/ML-% IJ SOLN
1650.0000 [IU]/h | INTRAMUSCULAR | Status: DC
  Administered 2012-08-15: 1650 [IU]/h via INTRAVENOUS
  Filled 2012-08-15 (×4): qty 250

## 2012-08-15 MED ORDER — TRANEXAMIC ACID 100 MG/ML IV SOLN
1.5000 mg/kg/h | INTRAVENOUS | Status: AC
  Administered 2012-08-16: 1.5 mg/kg/h via INTRAVENOUS
  Filled 2012-08-15: qty 25

## 2012-08-15 MED ORDER — PHENYLEPHRINE HCL 10 MG/ML IJ SOLN
30.0000 ug/min | INTRAVENOUS | Status: DC
  Filled 2012-08-15: qty 2

## 2012-08-15 MED ORDER — DEXTROSE 5 % IV SOLN
1.5000 g | INTRAVENOUS | Status: AC
  Administered 2012-08-16: 1.5 g via INTRAVENOUS
  Administered 2012-08-16: 750 g via INTRAVENOUS
  Filled 2012-08-15: qty 1.5

## 2012-08-15 MED ORDER — DEXMEDETOMIDINE HCL IN NACL 400 MCG/100ML IV SOLN
0.1000 ug/kg/h | INTRAVENOUS | Status: AC
  Administered 2012-08-16: 0.4 ug/kg/h via INTRAVENOUS
  Filled 2012-08-15: qty 100

## 2012-08-15 MED ORDER — VANCOMYCIN HCL 1000 MG IV SOLR
1250.0000 mg | INTRAVENOUS | Status: AC
  Administered 2012-08-16: 1250 mg via INTRAVENOUS
  Filled 2012-08-15: qty 1250

## 2012-08-15 MED ORDER — ALPRAZOLAM 0.25 MG PO TABS
0.2500 mg | ORAL_TABLET | ORAL | Status: DC | PRN
Start: 2012-08-15 — End: 2012-08-16
  Administered 2012-08-15: 0.25 mg via ORAL
  Filled 2012-08-15: qty 1

## 2012-08-15 MED ORDER — EPINEPHRINE HCL 1 MG/ML IJ SOLN
0.5000 ug/min | INTRAVENOUS | Status: DC
  Filled 2012-08-15: qty 4

## 2012-08-15 MED ORDER — DEXTROSE 5 % IV SOLN
750.0000 mg | INTRAVENOUS | Status: DC
  Filled 2012-08-15 (×2): qty 750

## 2012-08-15 MED ORDER — CHLORHEXIDINE GLUCONATE 4 % EX LIQD
60.0000 mL | Freq: Once | CUTANEOUS | Status: AC
  Administered 2012-08-16: 4 via TOPICAL
  Filled 2012-08-15: qty 60

## 2012-08-15 MED ORDER — TRANEXAMIC ACID (OHS) BOLUS VIA INFUSION
15.0000 mg/kg | INTRAVENOUS | Status: AC
  Administered 2012-08-16: 1393.5 mg via INTRAVENOUS
  Filled 2012-08-15: qty 1394

## 2012-08-15 MED ORDER — POTASSIUM CHLORIDE 2 MEQ/ML IV SOLN
80.0000 meq | INTRAVENOUS | Status: DC
  Filled 2012-08-15: qty 40

## 2012-08-15 MED ORDER — HYDROCODONE-ACETAMINOPHEN 5-325 MG PO TABS: 1.0000 | ORAL_TABLET | Freq: Four times a day (QID) | ORAL | Status: DC

## 2012-08-15 MED ORDER — HYDROCODONE-ACETAMINOPHEN 5-325 MG PO TABS
1.0000 | ORAL_TABLET | Freq: Four times a day (QID) | ORAL | Status: DC
  Administered 2012-08-15: 1 via ORAL
  Filled 2012-08-15: qty 1

## 2012-08-15 MED ORDER — SODIUM CHLORIDE 0.9 % IV SOLN
INTRAVENOUS | Status: AC
  Administered 2012-08-16: .8 [IU]/h via INTRAVENOUS
  Filled 2012-08-15: qty 1

## 2012-08-15 NOTE — Progress Notes (Signed)
PFT completed. Unconfirmed report placed in Progress Notes of [facility] chart.

## 2012-08-15 NOTE — Progress Notes (Signed)
   TELEMETRY: Reviewed telemetry pt in NSR: Filed Vitals:   08/14/12 2356 08/15/12 0354 08/15/12 0403 08/15/12 0745  BP:  106/64  135/78  Pulse:    66  Temp: 97.7 F (36.5 C)  97.7 F (36.5 C) 98.7 F (37.1 C)  TempSrc: Oral  Oral Oral  Resp:    18  Height:      Weight:      SpO2: 91%  91% 93%    Intake/Output Summary (Last 24 hours) at 08/15/12 0806 Last data filed at 08/15/12 0800  Gross per 24 hour  Intake 1455.25 ml  Output    250 ml  Net 1205.25 ml    SUBJECTIVE No chest pain or SOB. Was confused from sedation yesterday. Now alert and fully oriented.  LABS: Basic Metabolic Panel: No results found for this basename: NA:2,K:2,CL:2,CO2:2,GLUCOSE:2,BUN:2,CREATININE:2,CALCIUM:2,MG:2,PHOS:2 in the last 72 hours Liver Function Tests: No results found for this basename: AST:2,ALT:2,ALKPHOS:2,BILITOT:2,PROT:2,ALBUMIN:2 in the last 72 hours No results found for this basename: LIPASE:2,AMYLASE:2 in the last 72 hours CBC:  Basename 08/15/12 0500 08/14/12 0325  WBC 9.6 10.8*  NEUTROABS -- --  HGB 15.9 16.3  HCT 47.1 47.2  MCV 90.8 89.4  PLT 166 182   Cardiac Enzymes:  Basename 08/14/12 0325 08/13/12 2313 08/13/12 1653  CKTOTAL -- -- --  CKMB -- -- --  CKMBINDEX -- -- --  TROPONINI 0.52* 0.68* 0.49*   Hemoglobin A1C:  Basename 08/13/12 1653  HGBA1C 7.6*   Fasting Lipid Panel:  Basename 08/14/12 0325  CHOL 141  HDL 33*  LDLCALC 78  TRIG 409*  CHOLHDL 4.3  LDLDIRECT --    Radiology/Studies:  No results found.  PHYSICAL EXAM General: Well developed, well nourished, in no acute distress. Head: Normocephalic, atraumatic, sclera non-icteric, no xanthomas, nares are without discharge. Neck: Negative for carotid bruits. JVD not elevated. Lungs: Clear bilaterally to auscultation without wheezes, rales, or rhonchi. Breathing is unlabored. Heart: RRR S1 S2 without murmurs, rubs, or gallops.  Abdomen: Soft, non-tender, non-distended with normoactive bowel  sounds. No hepatomegaly. No rebound/guarding. No obvious abdominal masses. Msk:  Strength and tone appears normal for age. Extremities: No clubbing, cyanosis or edema.  Distal pedal pulses are 2+ and equal bilaterally. No radial hematoma. Neuro: Alert and oriented X 3. Moves all extremities spontaneously. Psych:  Responds to questions appropriately with a normal affect.  ASSESSMENT AND PLAN: 1. NSTEMI. Severe 3 vessel CAD. On IV heparin and NTG. Awaiting CT surgery evaluation. Continue ASA, statin, ACEi, and metoprolol.  2.IDDM   3. HTN controlled.  4. Hyperlipidemia    Principal Problem:  *NSTEMI (non-ST elevated myocardial infarction) Active Problems:  IDDM (insulin dependent diabetes mellitus)  HTN (hypertension)    Signed, Peter [place] MD,FACC 08/15/2012 8:08 AM

## 2012-08-15 NOTE — Progress Notes (Signed)
  Echocardiogram 2D Echocardiogram has been performed.  [place] 08/15/2012, 11:33 AM

## 2012-08-15 NOTE — Progress Notes (Signed)
CARDIAC REHAB PHASE I   PRE:  Rate/Rhythm: 69 SR  BP:  Supine:   Sitting: 116/77  Standing:    SaO2: 93 RA  MODE:  Ambulation: 1050 ft   POST:  Rate/Rhythem: 74 SR  BP:  Supine:   Sitting: 122/68  Standing:    SaO2: 95 RA 1405-1445 Pt tolerated ambulation well without c/o of cp or SOB VS stable Pt back to recliner after walk with call light in reach. Pt given Cardiac Surgery booklet and encouraged pt and wife to watch Going for [facility] video. Pt and wife declines to watch video.  Beatrix Fetters

## 2012-08-15 NOTE — Progress Notes (Signed)
Pre-op Cardiac Surgery  Carotid Findings:  There is no obvious evidence of hemodynamically significant internal carotid artery stenosis bilaterally. Bilateral antegrade vertebral artery flow.   Upper Extremity Right Left  Brachial Pressures Unable to obtain pressure-restricted limb Biphasic 116-Dampened monophasic  Radial Waveforms Monophasic Monophasic  Ulnar Waveforms Triphasic Monophasic  Palmar Arch (Allen's Test) Within normal limits Within normal limits     Lower  Extremity Right Left  Dorsalis Pedis Noncompressible-Triphasic 150-Biphasic  Anterior Tibial    Posterior Tibial 101-Triphasic 120-Monophasic  Ankle/Brachial Indices 0.87 1.29     Findings:  The right ABI=0.87, which is suggestive of mild disease. The left ABI=1.29, which may be falsely elevated due to calcified vessels.  08/15/2012 7:06 PM Gertie Fey, RDMS, RDCS

## 2012-08-15 NOTE — Progress Notes (Signed)
ANTICOAGULATION CONSULT NOTE - Follow Up Consult  Pharmacy Consult for heparin Indication: chest pain/ACS  No Known Allergies  Patient Measurements: Height: 5\' 9"  (175.3 cm) Weight: 204 lb 12.9 oz (92.9 kg) IBW/kg (Calculated) : 70.7    Vital Signs: Temp: 98.4 F (36.9 C) (09/04 1938) Temp src: Oral (09/04 1938) BP: 119/65 mmHg (09/04 1938) Pulse Rate: 68  (09/04 1728)  Labs:  Basename 08/15/12 2100 08/15/12 1453 08/15/12 1308 08/15/12 0500 08/14/12 0325 08/13/12 2313 08/13/12 1653  HGB -- -- -- 15.9 16.3 -- --  HCT -- -- -- 47.1 47.2 -- --  PLT -- -- -- 166 182 -- --  APTT -- -- 56* -- -- -- --  LABPROT -- -- 15.3* -- -- -- --  INR -- -- 1.18 -- -- -- --  HEPARINUNFRC 0.31 -- 0.10* 0.19* -- -- --  CREATININE -- 0.99 -- -- -- -- --  CKTOTAL -- -- -- -- -- -- --  CKMB -- -- -- -- -- -- --  TROPONINI -- -- -- -- 0.52* 0.68* 0.49*    Estimated Creatinine Clearance: 80.4 ml/min (by C-G formula based on Cr of 0.99).   Assessment: 66 yo male s/p cath with 3 vessel CAD for CABG on heparin and level is 0.31, therapeutic, after rate increased to 1650 units/hr.  No bleeding reported.  Goal of Therapy:  Heparin level 0.3-0.7 units/ml Monitor platelets by anticoagulation protocol: Yes   Plan:  continue heparin to 1650 units/hr -daily HL and CBC For CABG in am  Herby Abraham, Pharm.D. [phone] 08/15/2012 10:09 PM

## 2012-08-15 NOTE — Progress Notes (Signed)
ANTICOAGULATION CONSULT NOTE - Follow Up Consult  Pharmacy Consult for heparin Indication: chest pain/ACS  Labs:  Basename 08/15/12 0500 08/14/12 0325 08/13/12 2313 08/13/12 1653  HGB 15.9 16.3 -- --  HCT 47.1 47.2 -- --  PLT 166 182 -- --  APTT -- -- -- --  LABPROT -- -- -- --  INR -- -- -- --  HEPARINUNFRC 0.19* 0.32 0.43 --  CREATININE -- -- -- --  CKTOTAL -- -- -- --  CKMB -- -- -- --  TROPONINI -- 0.52* 0.68* 0.49*    Assessment: 66yo male subtherapeutic on heparin after resumed post-cath.  Goal of Therapy:  Heparin level 0.3-0.7 units/ml   Plan:  Will increase heparin gtt by 2 units/kg/hr to 1400 units/hr and check level in 6hr.  Colleen Can PharmD BCPS 08/15/2012,6:52 AM

## 2012-08-15 NOTE — Consult Note (Addendum)
Reason for Consult: 3 vessel CAD with unstable angina Referring Physician: Dr. [place]  [name] is an 66 y.o. male.  HPI: 66 year-old male with a history of HTN, hyperlipidemia, diabetes and known CAD, who presents with a cc/o chest pain.  His history of CAD dates back to 2001 when he had a NSTEMI treated with PTCA and stenting of his RCA. He has not had any regular cardiology followup since being treated for his MI in 2001.   He presented to [facility] ED on the morning of 08/13/12 after experiencing persistent CP unrelieved with antacids. He had been having episodes dating back to 6 days PTA. His first episode occurred after he had changed a tire. It was associated with nausea and vomiting, and the anterior chest discomfort was reminiscent of his MI presentation, although not as severe and with no associated LUE radiation. The morning of admission he had a similar episode of chest discomfort and diaphoresis, although without the associated nausea/vomiting, after eating breakfast. He did not have any NTG at home, but did take some TUMS, believing his pain might be indigestion. His symptoms persisted, and he was taken to the ED by his wife. He was treated with 4 baby aspirin, 3 NTG tablets, nitro paste, and subsequently placed on both intravenous NTG and heparin. His pain did then resolve. Initial cardiac markers showed a troponin I of 0.20. EKG showed NSR with an old inferior MI, but no acute changes.  He was transferred to [facility]. Yesterday he underwent cardiac cath and was found to have severe 3 vessel CAD with an EF of 35-40%. He has been pain free since admission.  Past Medical History  Diagnosis Date  . CAD (coronary artery disease)   . HTN (hypertension)   . HLD (hyperlipidemia)   . GERD (gastroesophageal reflux disease)   . Hiatal hernia   . Prostate cancer     Status post radiation treatment for  . Tobacco abuse   . Myocardial infarction   . Diabetes mellitus   . Peripheral  vascular disease   . Anginal pain     Past Surgical History  Procedure Date  . Back surgery   . Appendectomy     History reviewed. No pertinent family history.  Social History:  reports that he quit smoking about 39 years ago. He does not have any smokeless tobacco history on file. He reports that he does not drink alcohol or use illicit drugs.  Allergies: No Known Allergies  Medications:  Prior to Admission:  Prescriptions prior to admission  Medication Sig Dispense Refill  . atorvastatin (LIPITOR) 10 MG tablet Take 10 mg by mouth at bedtime.      . insulin glargine (LANTUS) 100 UNIT/ML injection Inject 40 Units into the skin at bedtime.      [name] lisinopril (PRINIVIL,ZESTRIL) 10 MG tablet Take 10 mg by mouth daily.      . metFORMIN (GLUCOPHAGE-XR) 500 MG 24 hr tablet Take 1,000 mg by mouth 2 (two) times daily.       . Tamsulosin HCl (FLOMAX) 0.4 MG CAPS Take 0.4 mg by mouth daily after supper.        Results for orders placed during the hospital encounter of 08/13/12 (from the past 48 hour(s))  MRSA PCR SCREENING     Status: Normal   Collection Time   08/13/12  3:11 PM      Component Value Range Comment   MRSA by PCR NEGATIVE  NEGATIVE   GLUCOSE, CAPILLARY  Status: Normal   Collection Time   08/13/12  4:45 PM      Component Value Range Comment   Glucose-Capillary 99  70 - 99 mg/dL   HEMOGLOBIN F6E     Status: Abnormal   Collection Time   08/13/12  4:53 PM      Component Value Range Comment   Hemoglobin A1C 7.6 (*) <5.7 %    Mean Plasma Glucose 171 (*) <117 mg/dL   TROPONIN I     Status: Abnormal   Collection Time   08/13/12  4:53 PM      Component Value Range Comment   Troponin I 0.49 (*) <0.30 ng/mL   HEPARIN LEVEL (UNFRACTIONATED)     Status: Abnormal   Collection Time   08/13/12  4:53 PM      Component Value Range Comment   Heparin Unfractionated 0.23 (*) 0.30 - 0.70 IU/mL   GLUCOSE, CAPILLARY     Status: Abnormal   Collection Time   08/13/12  9:08 PM      Component  Value Range Comment   Glucose-Capillary 155 (*) 70 - 99 mg/dL    Comment 1 Notify RN     GLUCOSE, CAPILLARY     Status: Abnormal   Collection Time   08/13/12  9:51 PM      Component Value Range Comment   Glucose-Capillary 196 (*) 70 - 99 mg/dL   TROPONIN I     Status: Abnormal   Collection Time   08/13/12 11:13 PM      Component Value Range Comment   Troponin I 0.68 (*) <0.30 ng/mL   HEPARIN LEVEL (UNFRACTIONATED)     Status: Normal   Collection Time   08/13/12 11:13 PM      Component Value Range Comment   Heparin Unfractionated 0.43  0.30 - 0.70 IU/mL   TROPONIN I     Status: Abnormal   Collection Time   08/14/12  3:25 AM      Component Value Range Comment   Troponin I 0.52 (*) <0.30 ng/mL   HEPARIN LEVEL (UNFRACTIONATED)     Status: Normal   Collection Time   08/14/12  3:25 AM      Component Value Range Comment   Heparin Unfractionated 0.32  0.30 - 0.70 IU/mL   CBC     Status: Abnormal   Collection Time   08/14/12  3:25 AM      Component Value Range Comment   WBC 10.8 (*) 4.0 - 10.5 K/uL    RBC 5.28  4.22 - 5.81 MIL/uL    Hemoglobin 16.3  13.0 - 17.0 g/dL    HCT 33.2  95.1 - 88.4 %    MCV 89.4  78.0 - 100.0 fL    MCH 30.9  26.0 - 34.0 pg    MCHC 34.5  30.0 - 36.0 g/dL    RDW 16.6  06.3 - 01.6 %    Platelets 182  150 - 400 K/uL   LIPID PANEL     Status: Abnormal   Collection Time   08/14/12  3:25 AM      Component Value Range Comment   Cholesterol 141  0 - 200 mg/dL    Triglycerides 010 (*) <150 mg/dL    HDL 33 (*) >93 mg/dL    Total CHOL/HDL Ratio 4.3      VLDL 30  0 - 40 mg/dL    LDL Cholesterol 78  0 - 99 mg/dL   GLUCOSE, CAPILLARY  Status: Abnormal   Collection Time   08/14/12  8:50 AM      Component Value Range Comment   Glucose-Capillary 107 (*) 70 - 99 mg/dL   GLUCOSE, CAPILLARY     Status: Normal   Collection Time   08/14/12 10:35 AM      Component Value Range Comment   Glucose-Capillary 79  70 - 99 mg/dL   GLUCOSE, CAPILLARY     Status: Normal   Collection  Time   08/14/12 11:53 AM      Component Value Range Comment   Glucose-Capillary 95  70 - 99 mg/dL    Comment 1 Notify RN      Comment 2 Documented in Chart     GLUCOSE, CAPILLARY     Status: Abnormal   Collection Time   08/14/12  4:36 PM      Component Value Range Comment   Glucose-Capillary 123 (*) 70 - 99 mg/dL   GLUCOSE, CAPILLARY     Status: Abnormal   Collection Time   08/14/12  8:53 PM      Component Value Range Comment   Glucose-Capillary 117 (*) 70 - 99 mg/dL    Comment 1 Documented in Chart      Comment 2 Notify RN     CBC     Status: Normal   Collection Time   08/15/12  5:00 AM      Component Value Range Comment   WBC 9.6  4.0 - 10.5 K/uL    RBC 5.19  4.22 - 5.81 MIL/uL    Hemoglobin 15.9  13.0 - 17.0 g/dL    HCT 09.8  11.9 - 14.7 %    MCV 90.8  78.0 - 100.0 fL    MCH 30.6  26.0 - 34.0 pg    MCHC 33.8  30.0 - 36.0 g/dL    RDW 82.9  56.2 - 13.0 %    Platelets 166  150 - 400 K/uL   HEPARIN LEVEL (UNFRACTIONATED)     Status: Abnormal   Collection Time   08/15/12  5:00 AM      Component Value Range Comment   Heparin Unfractionated 0.19 (*) 0.30 - 0.70 IU/mL   GLUCOSE, CAPILLARY     Status: Normal   Collection Time   08/15/12  7:55 AM      Component Value Range Comment   Glucose-Capillary 75  70 - 99 mg/dL   GLUCOSE, CAPILLARY     Status: Abnormal   Collection Time   08/15/12 11:39 AM      Component Value Range Comment   Glucose-Capillary 123 (*) 70 - 99 mg/dL   HEPARIN LEVEL (UNFRACTIONATED)     Status: Abnormal   Collection Time   08/15/12  1:08 PM      Component Value Range Comment   Heparin Unfractionated 0.10 (*) 0.30 - 0.70 IU/mL     No results found.  Review of Systems  Constitutional: Positive for malaise/fatigue and diaphoresis. Negative for fever and chills.  Cardiovascular: Positive for chest pain. Negative for orthopnea, leg swelling and PND.  Gastrointestinal: Positive for heartburn, nausea and vomiting.  Genitourinary: Positive for frequency.  All other  systems reviewed and are negative.   Blood pressure 108/65, pulse 73, temperature 97.9 F (36.6 C), temperature source Oral, resp. rate 17, height 5\' 9"  (1.753 m), weight 204 lb 12.9 oz (92.9 kg), SpO2 95.00%. Physical Exam  Vitals reviewed. Constitutional: He is oriented to person, place, and time. He appears well-developed and well-nourished.  No distress.  HENT:  Head: Normocephalic and atraumatic.  Eyes: EOM are normal. Pupils are equal, round, and reactive to light.  Neck: Neck supple. No thyromegaly present.  Cardiovascular: Normal rate, regular rhythm and intact distal pulses.   Murmur (2/6 systolic) heard. Respiratory: Effort normal and breath sounds normal. He has no wheezes. He has no rales.  GI: Soft. There is no tenderness.  Musculoskeletal: He exhibits no edema.  Lymphadenopathy:    He has no cervical adenopathy.  Neurological: He is alert and oriented to person, place, and time. No cranial nerve deficit.  Skin: Skin is warm and dry.  Psychiatric: He has a normal mood and affect.    Assessment/Plan: 66 yo male with an unstable coronary syndrome who has been found to have severe 3 vessel CAD and impaired LV function. CABG is the best treatment option in terms of survival benefit and relief of symptoms. He is at risk for increased rate of graft failure due to diffusely diseased vessels.  I have discussed with the patient and his family the general nature of the procedure, need for general anesthesia, and incisions to be used. I discussed the expected hospital stay, overall recovery and short and long term outcomes. They understand the risks include but are not limited to death, stroke, MI, DVT/PE, bleeding, possible need for transfusion, infections, cardiac arrhythmias, and other organ system dysfunction including respiratory, renal, or GI complications. He accepts the risks and agrees to proceed.  Plan CABG in AM 9/5  [name] C 08/15/2012, 2:06 PM

## 2012-08-15 NOTE — Progress Notes (Addendum)
ANTICOAGULATION CONSULT NOTE - Follow Up Consult  Pharmacy Consult for heparin Indication: chest pain/ACS  No Known Allergies  Patient Measurements: Height: 5\' 9"  (175.3 cm) Weight: 204 lb 12.9 oz (92.9 kg) IBW/kg (Calculated) : 70.7    Vital Signs: Temp: 97.9 F (36.6 C) (09/04 1140) Temp src: Oral (09/04 1140) BP: 108/65 mmHg (09/04 1140) Pulse Rate: 73  (09/04 1140)  Labs:  Basename 08/15/12 1308 08/15/12 0500 08/14/12 0325 08/13/12 2313 08/13/12 1653  HGB -- 15.9 16.3 -- --  HCT -- 47.1 47.2 -- --  PLT -- 166 182 -- --  APTT -- -- -- -- --  LABPROT -- -- -- -- --  INR -- -- -- -- --  HEPARINUNFRC 0.10* 0.19* 0.32 -- --  CREATININE -- -- -- -- --  CKTOTAL -- -- -- -- --  CKMB -- -- -- -- --  TROPONINI -- -- 0.52* 0.68* 0.49*    CrCl is unknown because no creatinine reading has been taken.   Medications:  Scheduled:     . albuterol  2.5 mg Nebulization Once  . aspirin EC  81 mg Oral Daily  . atorvastatin  80 mg Oral QHS  . HYDROcodone-acetaminophen  1 tablet Oral Q6H  . insulin aspart  0-15 Units Subcutaneous TID WC  . insulin aspart  0-5 Units Subcutaneous QHS  . insulin glargine  40 Units Subcutaneous QHS  . lisinopril  10 mg Oral Daily  . metoprolol tartrate  25 mg Oral BID  . Tamsulosin HCl  0.4 mg Oral QPC supper  . DISCONTD: HYDROcodone-acetaminophen  1 tablet Oral Q6H    Assessment: 66 yo male s/p cath with 3 vessel CAD for CABG on heparin and level still below goal (HL=0.1) despite rate increase to 1400 units/hr.  Goal of Therapy:  Heparin level 0.3-0.7 units/ml Monitor platelets by anticoagulation protocol: Yes   Plan:  -Increase heparin to 1650 units/hr -Heparin level in 6 hrs  Harland German, Pharm D 08/15/2012 2:05 PM

## 2012-08-16 ENCOUNTER — Encounter (HOSPITAL_COMMUNITY): Payer: Self-pay | Admitting: Anesthesiology

## 2012-08-16 ENCOUNTER — Encounter (HOSPITAL_COMMUNITY)
Admission: AD | Disposition: A | Discharge status: Home Health | Payer: Self-pay | Source: Other Acute Inpatient Hospital | Attending: Thoracic Surgery (Cardiothoracic Vascular Surgery)

## 2012-08-16 ENCOUNTER — Inpatient Hospital Stay (HOSPITAL_COMMUNITY): Payer: Medicare Other

## 2012-08-16 ENCOUNTER — Inpatient Hospital Stay (HOSPITAL_COMMUNITY): Payer: Medicare Other | Admitting: Anesthesiology

## 2012-08-16 DIAGNOSIS — I2510 Atherosclerotic heart disease of native coronary artery without angina pectoris: Secondary | ICD-10-CM

## 2012-08-16 HISTORY — PX: CORONARY ARTERY BYPASS GRAFT: SHX141

## 2012-08-16 LAB — POCT I-STAT 3, ART BLOOD GAS (G3+)
Acid-Base Excess: 1 mmol/L (ref 0.0–2.0)
Acid-base deficit: 2 mmol/L (ref 0.0–2.0)
Acid-base deficit: 4 mmol/L — ABNORMAL HIGH (ref 0.0–2.0)
Bicarbonate: 22 mEq/L (ref 20.0–24.0)
O2 Saturation: 100 %
O2 Saturation: 100 %
O2 Saturation: 97 %
Patient temperature: 37.3
TCO2: 25 mmol/L (ref 0–100)
pCO2 arterial: 33.2 mmHg — ABNORMAL LOW (ref 35.0–45.0)
pH, Arterial: 7.401 (ref 7.350–7.450)
pH, Arterial: 7.462 — ABNORMAL HIGH (ref 7.350–7.450)
pO2, Arterial: 72 mmHg — ABNORMAL LOW (ref 80.0–100.0)
pO2, Arterial: 106 mmHg — ABNORMAL HIGH (ref 80.0–100.0)

## 2012-08-16 LAB — POCT I-STAT 4, (NA,K, GLUC, HGB,HCT)
Glucose, Bld: 86 mg/dL (ref 70–99)
Glucose, Bld: 84 mg/dL (ref 70–99)
Glucose, Bld: 111 mg/dL — ABNORMAL HIGH (ref 70–99)
Glucose, Bld: 96 mg/dL (ref 70–99)
HCT: 41 % (ref 39.0–52.0)
HCT: 30 % — ABNORMAL LOW (ref 39.0–52.0)
HCT: 32 % — ABNORMAL LOW (ref 39.0–52.0)
HCT: 36 % — ABNORMAL LOW (ref 39.0–52.0)
Hemoglobin: 12.9 g/dL — ABNORMAL LOW (ref 13.0–17.0)
Hemoglobin: 10.2 g/dL — ABNORMAL LOW (ref 13.0–17.0)
Hemoglobin: 10.9 g/dL — ABNORMAL LOW (ref 13.0–17.0)
Potassium: 3.9 mEq/L (ref 3.5–5.1)
Potassium: 6.7 mEq/L (ref 3.5–5.1)
Potassium: 6.1 mEq/L — ABNORMAL HIGH (ref 3.5–5.1)
Potassium: 4.7 mEq/L (ref 3.5–5.1)
Sodium: 139 mEq/L (ref 135–145)
Sodium: 135 mEq/L (ref 135–145)
Sodium: 138 mEq/L (ref 135–145)

## 2012-08-16 LAB — BASIC METABOLIC PANEL
BUN: 16 mg/dL (ref 6–23)
Calcium: 10.2 mg/dL (ref 8.4–10.5)
GFR calc non Af Amer: 70 mL/min — ABNORMAL LOW (ref >90)
Glucose, Bld: 61 mg/dL — ABNORMAL LOW (ref 70–99)

## 2012-08-16 LAB — MAGNESIUM: Magnesium: 2.8 mg/dL — ABNORMAL HIGH (ref 1.5–2.5)

## 2012-08-16 LAB — CBC
HCT: 46 % (ref 39.0–52.0)
HCT: 36.9 % — ABNORMAL LOW (ref 39.0–52.0)
Hemoglobin: 12.5 g/dL — ABNORMAL LOW (ref 13.0–17.0)
Hemoglobin: 12.3 g/dL — ABNORMAL LOW (ref 13.0–17.0)
MCH: 30.7 pg (ref 26.0–34.0)
MCHC: 33.9 g/dL (ref 30.0–36.0)
MCHC: 33.9 g/dL (ref 30.0–36.0)
MCHC: 34.3 g/dL (ref 30.0–36.0)
MCV: 90 fL (ref 78.0–100.0)
MCV: 89.3 fL (ref 78.0–100.0)
RDW: 13.1 % (ref 11.5–15.5)
RDW: 13.1 % (ref 11.5–15.5)
RDW: 13.2 % (ref 11.5–15.5)

## 2012-08-16 LAB — GLUCOSE, CAPILLARY
Glucose-Capillary: 64 mg/dL — ABNORMAL LOW (ref 70–99)
Glucose-Capillary: 72 mg/dL (ref 70–99)
Glucose-Capillary: 50 mg/dL — ABNORMAL LOW (ref 70–99)
Glucose-Capillary: 74 mg/dL (ref 70–99)
Glucose-Capillary: 80 mg/dL (ref 70–99)
Glucose-Capillary: 96 mg/dL (ref 70–99)

## 2012-08-16 LAB — POCT I-STAT, CHEM 8
BUN: 12 mg/dL (ref 6–23)
Creatinine, Ser: 1 mg/dL (ref 0.50–1.35)
Sodium: 143 mEq/L (ref 135–145)
TCO2: 20 mmol/L (ref 0–100)

## 2012-08-16 LAB — HEMOGLOBIN AND HEMATOCRIT, BLOOD: Hemoglobin: 11 g/dL — ABNORMAL LOW (ref 13.0–17.0)

## 2012-08-16 LAB — PLATELET COUNT: Platelets: 143 10*3/uL — ABNORMAL LOW (ref 150–400)

## 2012-08-16 LAB — PROTIME-INR: INR: 1.54 — ABNORMAL HIGH (ref 0.00–1.49)

## 2012-08-16 SURGERY — CORONARY ARTERY BYPASS GRAFTING (CABG)
Anesthesia: General | Site: Chest | Wound class: Clean

## 2012-08-16 MED ORDER — MORPHINE SULFATE 2 MG/ML IJ SOLN
2.0000 mg | INTRAMUSCULAR | Status: DC | PRN
  Administered 2012-08-17: 2 mg via INTRAVENOUS
  Administered 2012-08-17 (×3): 4 mg via INTRAVENOUS
  Administered 2012-08-17: 2 mg via INTRAVENOUS
  Administered 2012-08-17: 4 mg via INTRAVENOUS
  Administered 2012-08-17 – 2012-08-18 (×3): 2 mg via INTRAVENOUS
  Administered 2012-08-18 (×2): 4 mg via INTRAVENOUS
  Filled 2012-08-16 (×5): qty 2
  Filled 2012-08-16: qty 1
  Filled 2012-08-16 (×3): qty 2
  Filled 2012-08-16: qty 1
  Filled 2012-08-16: qty 2

## 2012-08-16 MED ORDER — DOPAMINE-DEXTROSE 3.2-5 MG/ML-% IV SOLN
INTRAVENOUS | Status: AC
  Administered 2012-08-16: 3 ug/kg/min via INTRAVENOUS
  Filled 2012-08-16: qty 250

## 2012-08-16 MED ORDER — ASPIRIN EC 325 MG PO TBEC
325.0000 mg | DELAYED_RELEASE_TABLET | Freq: Every day | ORAL | Status: DC
  Administered 2012-08-18 – 2012-08-26 (×9): 325 mg via ORAL
  Filled 2012-08-16 (×10): qty 1

## 2012-08-16 MED ORDER — ROCURONIUM BROMIDE 100 MG/10ML IV SOLN
INTRAVENOUS | Status: DC | PRN
  Administered 2012-08-16 (×2): 50 mg via INTRAVENOUS

## 2012-08-16 MED ORDER — ALBUMIN HUMAN 5 % IV SOLN
250.0000 mL | INTRAVENOUS | Status: AC | PRN
  Administered 2012-08-16 (×4): 250 mL via INTRAVENOUS
  Filled 2012-08-16: qty 500

## 2012-08-16 MED ORDER — ASPIRIN 81 MG PO CHEW: 324.0000 mg | CHEWABLE_TABLET | Freq: Every day | ORAL | Status: DC

## 2012-08-16 MED ORDER — PANTOPRAZOLE SODIUM 40 MG PO TBEC
40.0000 mg | DELAYED_RELEASE_TABLET | Freq: Every day | ORAL | Status: DC
  Administered 2012-08-19 – 2012-08-25 (×6): 40 mg via ORAL
  Filled 2012-08-16 (×6): qty 1

## 2012-08-16 MED ORDER — SODIUM CHLORIDE 0.9 % IV SOLN
250.0000 mL | INTRAVENOUS | Status: DC
  Administered 2012-08-17: 250 mL via INTRAVENOUS

## 2012-08-16 MED ORDER — HEPARIN SODIUM (PORCINE) 1000 UNIT/ML IJ SOLN
INTRAMUSCULAR | Status: AC
  Filled 2012-08-16: qty 1

## 2012-08-16 MED ORDER — SODIUM BICARBONATE 8.4 % IV SOLN
50.0000 meq | Freq: Once | INTRAVENOUS | Status: AC
  Administered 2012-08-16: 50 meq via INTRAVENOUS
  Filled 2012-08-16: qty 50

## 2012-08-16 MED ORDER — MORPHINE SULFATE 2 MG/ML IJ SOLN
1.0000 mg | INTRAMUSCULAR | Status: AC | PRN
  Administered 2012-08-16 (×2): 2 mg via INTRAVENOUS
  Filled 2012-08-16: qty 1

## 2012-08-16 MED ORDER — LACTATED RINGERS IV SOLN
INTRAVENOUS | Status: DC
  Administered 2012-08-16: 20 mL/h via INTRAVENOUS

## 2012-08-16 MED ORDER — ONDANSETRON HCL 4 MG/2ML IJ SOLN
4.0000 mg | Freq: Four times a day (QID) | INTRAMUSCULAR | Status: DC | PRN
  Administered 2012-08-16 – 2012-08-24 (×5): 4 mg via INTRAVENOUS
  Filled 2012-08-16 (×3): qty 2

## 2012-08-16 MED ORDER — HEPARIN SODIUM (PORCINE) 1000 UNIT/ML IJ SOLN
INTRAMUSCULAR | Status: DC | PRN
  Administered 2012-08-16: 2000 [IU] via INTRAVENOUS
  Administered 2012-08-16: 31000 [IU] via INTRAVENOUS

## 2012-08-16 MED ORDER — MIDAZOLAM HCL 5 MG/5ML IJ SOLN
INTRAMUSCULAR | Status: DC | PRN
  Administered 2012-08-16 (×2): 2 mg via INTRAVENOUS
  Administered 2012-08-16 (×2): 3 mg via INTRAVENOUS

## 2012-08-16 MED ORDER — VANCOMYCIN HCL IN DEXTROSE 1-5 GM/200ML-% IV SOLN
1000.0000 mg | Freq: Once | INTRAVENOUS | Status: AC
  Administered 2012-08-16: 1000 mg via INTRAVENOUS
  Filled 2012-08-16: qty 200

## 2012-08-16 MED ORDER — OXYCODONE HCL 5 MG PO TABS
5.0000 mg | ORAL_TABLET | ORAL | Status: DC | PRN
  Administered 2012-08-17 – 2012-08-19 (×3): 5 mg via ORAL
  Filled 2012-08-16 (×3): qty 1

## 2012-08-16 MED ORDER — ACETAMINOPHEN 500 MG PO TABS
1000.0000 mg | ORAL_TABLET | Freq: Four times a day (QID) | ORAL | Status: AC
  Administered 2012-08-17 – 2012-08-21 (×7): 1000 mg via ORAL
  Filled 2012-08-16 (×19): qty 2

## 2012-08-16 MED ORDER — SODIUM BICARBONATE 8.4 % IV SOLN
INTRAVENOUS | Status: AC
  Filled 2012-08-16: qty 50

## 2012-08-16 MED ORDER — PROPOFOL 10 MG/ML IV EMUL
INTRAVENOUS | Status: DC | PRN
  Administered 2012-08-16: 90 mg via INTRAVENOUS

## 2012-08-16 MED ORDER — INSULIN ASPART 100 UNIT/ML ~~LOC~~ SOLN: 0.0000 [IU] | SUBCUTANEOUS | Status: AC

## 2012-08-16 MED ORDER — LACTATED RINGERS IV SOLN
INTRAVENOUS | Status: DC | PRN
  Administered 2012-08-16 (×2): via INTRAVENOUS

## 2012-08-16 MED ORDER — ALBUMIN HUMAN 5 % IV SOLN
INTRAVENOUS | Status: DC | PRN
  Administered 2012-08-16 (×2): via INTRAVENOUS

## 2012-08-16 MED ORDER — VECURONIUM BROMIDE 10 MG IV SOLR
INTRAVENOUS | Status: DC | PRN
  Administered 2012-08-16: 5 mg via INTRAVENOUS
  Administered 2012-08-16: 10 mg via INTRAVENOUS
  Administered 2012-08-16: 5 mg via INTRAVENOUS

## 2012-08-16 MED ORDER — FENTANYL CITRATE 0.05 MG/ML IJ SOLN
INTRAMUSCULAR | Status: DC | PRN
  Administered 2012-08-16 (×2): 250 ug via INTRAVENOUS
  Administered 2012-08-16: 50 ug via INTRAVENOUS
  Administered 2012-08-16 (×3): 250 ug via INTRAVENOUS

## 2012-08-16 MED ORDER — METOPROLOL TARTRATE 1 MG/ML IV SOLN
2.5000 mg | INTRAVENOUS | Status: DC | PRN
  Administered 2012-08-17 – 2012-08-18 (×3): 5 mg via INTRAVENOUS
  Filled 2012-08-16 (×2): qty 5

## 2012-08-16 MED ORDER — METOPROLOL TARTRATE 12.5 MG HALF TABLET
12.5000 mg | ORAL_TABLET | Freq: Two times a day (BID) | ORAL | Status: DC
  Administered 2012-08-17 – 2012-08-19 (×4): 12.5 mg via ORAL
  Filled 2012-08-16 (×9): qty 1

## 2012-08-16 MED ORDER — SODIUM CHLORIDE 0.9 % IV SOLN
INTRAVENOUS | Status: DC
  Filled 2012-08-16: qty 1

## 2012-08-16 MED ORDER — DEXMEDETOMIDINE HCL IN NACL 200 MCG/50ML IV SOLN
0.1000 ug/kg/h | INTRAVENOUS | Status: DC
  Administered 2012-08-16: 0.7 ug/kg/h via INTRAVENOUS
  Filled 2012-08-16: qty 50

## 2012-08-16 MED ORDER — LACTATED RINGERS IV SOLN
INTRAVENOUS | Status: DC | PRN
  Administered 2012-08-16: 07:00:00 via INTRAVENOUS

## 2012-08-16 MED ORDER — MIDAZOLAM HCL 2 MG/2ML IJ SOLN: 2.0000 mg | INTRAMUSCULAR | Status: DC | PRN

## 2012-08-16 MED ORDER — SODIUM CHLORIDE 0.9 % IV SOLN
20.0000 mg | INTRAVENOUS | Status: DC | PRN
  Administered 2012-08-16: 100 ug/min via INTRAVENOUS

## 2012-08-16 MED ORDER — INSULIN REGULAR BOLUS VIA INFUSION
0.0000 [IU] | Freq: Three times a day (TID) | INTRAVENOUS | Status: DC
  Filled 2012-08-16: qty 10

## 2012-08-16 MED ORDER — DOCUSATE SODIUM 100 MG PO CAPS
200.0000 mg | ORAL_CAPSULE | Freq: Every day | ORAL | Status: DC
  Administered 2012-08-18 – 2012-08-20 (×3): 200 mg via ORAL
  Filled 2012-08-16 (×5): qty 2

## 2012-08-16 MED ORDER — METOPROLOL TARTRATE 25 MG/10 ML ORAL SUSPENSION
12.5000 mg | Freq: Two times a day (BID) | ORAL | Status: DC
  Filled 2012-08-16: qty 5

## 2012-08-16 MED ORDER — ACETAMINOPHEN 160 MG/5ML PO SOLN: 650.0000 mg | ORAL | Status: AC

## 2012-08-16 MED ORDER — INSULIN ASPART 100 UNIT/ML ~~LOC~~ SOLN: 0.0000 [IU] | SUBCUTANEOUS | Status: DC

## 2012-08-16 MED ORDER — ACETAMINOPHEN 650 MG RE SUPP
650.0000 mg | RECTAL | Status: AC
  Administered 2012-08-16: 650 mg via RECTAL

## 2012-08-16 MED ORDER — SODIUM CHLORIDE 0.9 % IJ SOLN
OROMUCOSAL | Status: DC | PRN
  Administered 2012-08-16 (×4): via TOPICAL

## 2012-08-16 MED ORDER — DEXTROSE 5 % IV SOLN
1.5000 g | Freq: Two times a day (BID) | INTRAVENOUS | Status: DC
  Administered 2012-08-16: 1.5 g via INTRAVENOUS
  Filled 2012-08-16 (×2): qty 1.5

## 2012-08-16 MED ORDER — DEXTROSE 50 % IV SOLN
25.0000 mL | Freq: Once | INTRAVENOUS | Status: DC | PRN
  Filled 2012-08-16: qty 50

## 2012-08-16 MED ORDER — LACTATED RINGERS IV SOLN
INTRAVENOUS | Status: DC | PRN
  Administered 2012-08-16 (×2): via INTRAVENOUS

## 2012-08-16 MED ORDER — SODIUM CHLORIDE 0.9 % IJ SOLN
3.0000 mL | Freq: Two times a day (BID) | INTRAMUSCULAR | Status: DC
  Administered 2012-08-17 – 2012-08-22 (×10): 3 mL via INTRAVENOUS

## 2012-08-16 MED ORDER — DEXTROSE 50 % IV SOLN
INTRAVENOUS | Status: AC
  Administered 2012-08-16: 25 mL
  Filled 2012-08-16: qty 50

## 2012-08-16 MED ORDER — FAMOTIDINE IN NACL 20-0.9 MG/50ML-% IV SOLN
20.0000 mg | Freq: Two times a day (BID) | INTRAVENOUS | Status: AC
  Administered 2012-08-16 – 2012-08-17 (×2): 20 mg via INTRAVENOUS
  Filled 2012-08-16: qty 50

## 2012-08-16 MED ORDER — LIDOCAINE HCL (CARDIAC) 20 MG/ML IV SOLN
INTRAVENOUS | Status: AC
  Filled 2012-08-16: qty 5

## 2012-08-16 MED ORDER — BISACODYL 5 MG PO TBEC
10.0000 mg | DELAYED_RELEASE_TABLET | Freq: Every day | ORAL | Status: DC
  Administered 2012-08-19 – 2012-08-20 (×2): 10 mg via ORAL
  Filled 2012-08-16 (×5): qty 2

## 2012-08-16 MED ORDER — BISACODYL 10 MG RE SUPP: 10.0000 mg | Freq: Every day | RECTAL | Status: DC

## 2012-08-16 MED ORDER — ALBUMIN HUMAN 5 % IV SOLN
INTRAVENOUS | Status: AC
  Filled 2012-08-16: qty 250

## 2012-08-16 MED ORDER — 0.9 % SODIUM CHLORIDE (POUR BTL) OPTIME
TOPICAL | Status: DC | PRN
  Administered 2012-08-16: 5000 mL

## 2012-08-16 MED ORDER — SODIUM CHLORIDE 0.45 % IV SOLN
INTRAVENOUS | Status: DC
  Administered 2012-08-16: 20 mL/h via INTRAVENOUS
  Administered 2012-08-18: 20:00:00 via INTRAVENOUS

## 2012-08-16 MED ORDER — CALCIUM CHLORIDE 10 % IV SOLN
1.0000 g | Freq: Once | INTRAVENOUS | Status: AC
  Administered 2012-08-16: 1 g via INTRAVENOUS

## 2012-08-16 MED ORDER — DEXTROSE 50 % IV SOLN
INTRAVENOUS | Status: AC
  Administered 2012-08-16: 20 mL
  Filled 2012-08-16: qty 50

## 2012-08-16 MED ORDER — DEXTROSE 50 % IV SOLN
25.0000 mL | Freq: Once | INTRAVENOUS | Status: AC | PRN
  Administered 2012-08-16: 25 mL via INTRAVENOUS

## 2012-08-16 MED ORDER — DEXTROSE 5 % IV SOLN
0.0000 ug/min | INTRAVENOUS | Status: DC
  Filled 2012-08-16 (×2): qty 2

## 2012-08-16 MED ORDER — POTASSIUM CHLORIDE 10 MEQ/50ML IV SOLN: 10.0000 meq | INTRAVENOUS | Status: AC

## 2012-08-16 MED ORDER — TAMSULOSIN HCL 0.4 MG PO CAPS
0.4000 mg | ORAL_CAPSULE | Freq: Every day | ORAL | Status: DC
  Filled 2012-08-16: qty 1

## 2012-08-16 MED ORDER — SODIUM CHLORIDE 0.9 % IV SOLN
INTRAVENOUS | Status: DC
  Administered 2012-08-16: 20 mL/h via INTRAVENOUS

## 2012-08-16 MED ORDER — NITROGLYCERIN IN D5W 200-5 MCG/ML-% IV SOLN
0.0000 ug/min | INTRAVENOUS | Status: DC
  Administered 2012-08-16: 35 ug/min via INTRAVENOUS

## 2012-08-16 MED ORDER — CEFUROXIME SODIUM 1.5 G IJ SOLR
1.5000 g | Freq: Two times a day (BID) | INTRAMUSCULAR | Status: AC
  Administered 2012-08-17 (×2): 1.5 g via INTRAVENOUS
  Filled 2012-08-16 (×3): qty 1.5

## 2012-08-16 MED ORDER — SODIUM CHLORIDE 0.9 % IJ SOLN: 3.0000 mL | INTRAMUSCULAR | Status: DC | PRN

## 2012-08-16 MED ORDER — DOPAMINE-DEXTROSE 3.2-5 MG/ML-% IV SOLN
3.0000 ug/kg/min | INTRAVENOUS | Status: AC
  Administered 2012-08-16 (×2): 3 ug/kg/min via INTRAVENOUS

## 2012-08-16 MED ORDER — DEXTROSE 5 % IV SOLN: 1.5000 g | Freq: Two times a day (BID) | INTRAVENOUS | Status: DC

## 2012-08-16 MED ORDER — MAGNESIUM SULFATE 40 MG/ML IJ SOLN
4.0000 g | Freq: Once | INTRAMUSCULAR | Status: AC
  Administered 2012-08-16: 4 g via INTRAVENOUS
  Filled 2012-08-16: qty 100

## 2012-08-16 MED ORDER — METOPROLOL TARTRATE 12.5 MG HALF TABLET
12.5000 mg | ORAL_TABLET | Freq: Two times a day (BID) | ORAL | Status: DC
  Filled 2012-08-16: qty 1

## 2012-08-16 MED ORDER — METOPROLOL TARTRATE 25 MG/10 ML ORAL SUSPENSION
12.5000 mg | Freq: Two times a day (BID) | ORAL | Status: DC
  Filled 2012-08-16 (×9): qty 5

## 2012-08-16 MED ORDER — PROTAMINE SULFATE 10 MG/ML IV SOLN
INTRAVENOUS | Status: DC | PRN
  Administered 2012-08-16 (×6): 50 mg via INTRAVENOUS

## 2012-08-16 MED ORDER — HEMOSTATIC AGENTS (NO CHARGE) OPTIME
TOPICAL | Status: DC | PRN
  Administered 2012-08-16: 1 via TOPICAL

## 2012-08-16 MED ORDER — LACTATED RINGERS IV SOLN
500.0000 mL | Freq: Once | INTRAVENOUS | Status: AC | PRN
  Administered 2012-08-16: 500 mL via INTRAVENOUS

## 2012-08-16 MED ORDER — ACETAMINOPHEN 160 MG/5ML PO SOLN
975.0000 mg | Freq: Four times a day (QID) | ORAL | Status: AC
  Administered 2012-08-16: 975 mg
  Filled 2012-08-16: qty 40.6

## 2012-08-16 SURGICAL SUPPLY — 96 items
ADH SKN CLS APL DERMABOND .7 (GAUZE/BANDAGES/DRESSINGS) ×1
ATTRACTOMAT 16X20 MAGNETIC DRP (DRAPES) ×2 IMPLANT
BAG DECANTER FOR FLEXI CONT (MISCELLANEOUS) ×2 IMPLANT
BANDAGE ELASTIC 4 VELCRO ST LF (GAUZE/BANDAGES/DRESSINGS) ×2 IMPLANT
BANDAGE ELASTIC 6 VELCRO ST LF (GAUZE/BANDAGES/DRESSINGS) ×2 IMPLANT
BANDAGE GAUZE ELAST BULKY 4 IN (GAUZE/BANDAGES/DRESSINGS) ×2 IMPLANT
BASKET HEART (ORDER IN 25'S) (MISCELLANEOUS) ×1
BASKET HEART (ORDER IN 25S) (MISCELLANEOUS) ×1 IMPLANT
BLADE STERNUM SYSTEM 6 (BLADE) ×2 IMPLANT
CANISTER SUCTION 2500CC (MISCELLANEOUS) ×2 IMPLANT
CANN PRFSN .5XCNCT 15X34-48 (MISCELLANEOUS)
CANNULA PRFSN .5XCNCT 15X34-48 (MISCELLANEOUS) IMPLANT
CANNULA VEN 2 STAGE (MISCELLANEOUS)
CATH CPB KIT HENDRICKSON (MISCELLANEOUS) ×2 IMPLANT
CATH ROBINSON RED A/P 18FR (CATHETERS) ×2 IMPLANT
CATH THORACIC 36FR (CATHETERS) ×2 IMPLANT
CATH THORACIC 36FR RT ANG (CATHETERS) ×2 IMPLANT
CLIP TI MEDIUM 24 (CLIP) IMPLANT
CLIP TI WIDE RED SMALL 24 (CLIP) ×3 IMPLANT
CLOTH BEACON ORANGE TIMEOUT ST (SAFETY) ×2 IMPLANT
COVER SURGICAL LIGHT HANDLE (MISCELLANEOUS) ×4 IMPLANT
CRADLE DONUT ADULT HEAD (MISCELLANEOUS) ×2 IMPLANT
DERMABOND ADVANCED (GAUZE/BANDAGES/DRESSINGS) ×1
DERMABOND ADVANCED .7 DNX12 (GAUZE/BANDAGES/DRESSINGS) IMPLANT
DRAPE CARDIOVASCULAR INCISE (DRAPES) ×2
DRAPE SLUSH MACHINE 52X66 (DRAPES) IMPLANT
DRAPE SLUSH/WARMER DISC (DRAPES) ×1 IMPLANT
DRAPE SRG 135X102X78XABS (DRAPES) ×1 IMPLANT
DRSG COVADERM 4X14 (GAUZE/BANDAGES/DRESSINGS) ×2 IMPLANT
ELECT REM PT RETURN 9FT ADLT (ELECTROSURGICAL) ×4
ELECTRODE REM PT RTRN 9FT ADLT (ELECTROSURGICAL) ×2 IMPLANT
GLOVE BIO SURGEON STRL SZ 6 (GLOVE) IMPLANT
GLOVE BIO SURGEON STRL SZ 6.5 (GLOVE) IMPLANT
GLOVE BIO SURGEON STRL SZ7 (GLOVE) IMPLANT
GLOVE BIO SURGEON STRL SZ7.5 (GLOVE) IMPLANT
GLOVE BIOGEL PI IND STRL 6 (GLOVE) IMPLANT
GLOVE BIOGEL PI IND STRL 6.5 (GLOVE) IMPLANT
GLOVE BIOGEL PI IND STRL 7.0 (GLOVE) IMPLANT
GLOVE BIOGEL PI IND STRL 7.5 (GLOVE) IMPLANT
GLOVE BIOGEL PI INDICATOR 6 (GLOVE) ×1
GLOVE BIOGEL PI INDICATOR 6.5 (GLOVE)
GLOVE BIOGEL PI INDICATOR 7.0 (GLOVE) ×4
GLOVE BIOGEL PI INDICATOR 7.5 (GLOVE)
GLOVE EUDERMIC 7 POWDERFREE (GLOVE) ×6 IMPLANT
GOWN PREVENTION PLUS XLARGE (GOWN DISPOSABLE) ×4 IMPLANT
GOWN STRL NON-REIN LRG LVL3 (GOWN DISPOSABLE) ×8 IMPLANT
HEMOSTAT POWDER SURGIFOAM 1G (HEMOSTASIS) ×6 IMPLANT
HEMOSTAT SURGICEL 2X14 (HEMOSTASIS) ×2 IMPLANT
INSERT FOGARTY XLG (MISCELLANEOUS) IMPLANT
KIT BASIN OR (CUSTOM PROCEDURE TRAY) ×2 IMPLANT
KIT ROOM TURNOVER OR (KITS) ×2 IMPLANT
KIT SUCTION CATH 14FR (SUCTIONS) ×4 IMPLANT
KIT VASOVIEW W/TROCAR VH 2000 (KITS) ×2 IMPLANT
MARKER GRAFT CORONARY BYPASS (MISCELLANEOUS) ×6 IMPLANT
NS IRRIG 1000ML POUR BTL (IV SOLUTION) ×11 IMPLANT
PACK OPEN HEART (CUSTOM PROCEDURE TRAY) ×2 IMPLANT
PAD ARMBOARD 7.5X6 YLW CONV (MISCELLANEOUS) ×4 IMPLANT
PENCIL BUTTON HOLSTER BLD 10FT (ELECTRODE) ×2 IMPLANT
PUNCH AORTIC ROTATE 4.0MM (MISCELLANEOUS) IMPLANT
PUNCH AORTIC ROTATE 4.5MM 8IN (MISCELLANEOUS) IMPLANT
PUNCH AORTIC ROTATE 5MM 8IN (MISCELLANEOUS) IMPLANT
SET CARDIOPLEGIA MPS 5001102 (MISCELLANEOUS) ×1 IMPLANT
SPONGE GAUZE 4X4 12PLY (GAUZE/BANDAGES/DRESSINGS) ×4 IMPLANT
SUT BONE WAX W31G (SUTURE) ×2 IMPLANT
SUT MNCRL AB 4-0 PS2 18 (SUTURE) ×2 IMPLANT
SUT PROLENE 3 0 SH DA (SUTURE) ×2 IMPLANT
SUT PROLENE 4 0 RB 1 (SUTURE)
SUT PROLENE 4 0 SH DA (SUTURE) IMPLANT
SUT PROLENE 4-0 RB1 .5 CRCL 36 (SUTURE) IMPLANT
SUT PROLENE 6 0 C 1 30 (SUTURE) ×5 IMPLANT
SUT PROLENE 7 0 BV1 MDA (SUTURE) ×3 IMPLANT
SUT PROLENE 8 0 BV175 6 (SUTURE) IMPLANT
SUT SILK  1 MH (SUTURE)
SUT SILK 1 MH (SUTURE) IMPLANT
SUT STEEL 6MS V (SUTURE) IMPLANT
SUT STEEL STERNAL CCS#1 18IN (SUTURE) IMPLANT
SUT STEEL SZ 6 DBL 3X14 BALL (SUTURE) ×3 IMPLANT
SUT VIC AB 1 CTX 36 (SUTURE) ×4
SUT VIC AB 1 CTX36XBRD ANBCTR (SUTURE) ×2 IMPLANT
SUT VIC AB 2-0 CT1 27 (SUTURE) ×4
SUT VIC AB 2-0 CT1 TAPERPNT 27 (SUTURE) IMPLANT
SUT VIC AB 2-0 CTX 27 (SUTURE) IMPLANT
SUT VIC AB 3-0 SH 27 (SUTURE)
SUT VIC AB 3-0 SH 27X BRD (SUTURE) IMPLANT
SUT VIC AB 3-0 X1 27 (SUTURE) IMPLANT
SUT VICRYL 4-0 PS2 18IN ABS (SUTURE) IMPLANT
SUTURE E-PAK OPEN HEART (SUTURE) ×2 IMPLANT
SYSTEM SAHARA CHEST DRAIN ATS (WOUND CARE) ×2 IMPLANT
TAPE CLOTH SURG 4X10 WHT LF (GAUZE/BANDAGES/DRESSINGS) ×2 IMPLANT
TOWEL OR 17X24 6PK STRL BLUE (TOWEL DISPOSABLE) ×4 IMPLANT
TOWEL OR 17X26 10 PK STRL BLUE (TOWEL DISPOSABLE) ×4 IMPLANT
TRAY FOLEY IC TEMP SENS 14FR (CATHETERS) ×2 IMPLANT
TUBE FEEDING 8FR 16IN STR KANG (MISCELLANEOUS) ×2 IMPLANT
TUBING INSUFFLATION 10FT LAP (TUBING) ×2 IMPLANT
UNDERPAD 30X30 INCONTINENT (UNDERPADS AND DIAPERS) ×2 IMPLANT
WATER STERILE IRR 1000ML POUR (IV SOLUTION) ×4 IMPLANT

## 2012-08-16 NOTE — Procedures (Signed)
Extubation Procedure Note  Patient Details:   Name: [name] DOB: [birth year] MRN: [id number]   Airway Documentation:     Evaluation  O2 sats: stable throughout Complications: No apparent complications Patient did tolerate procedure well. Bilateral Breath Sounds: Clear;Diminished Suctioning: Airway Yes  Pt. Extubated to 5L [place]. Pt. Performed 1.0L on the vital capacity and -38 on the NIF. Pt. Also performed 500x5 on the IS. Good gag reflex, positive leak test. No complications for now.  Adela Ports 08/16/2012, 10:31 PM

## 2012-08-16 NOTE — Progress Notes (Signed)
RT titrated FiO2 to 70% and increased RR to 14 per MD. RT will continue to monitor.

## 2012-08-16 NOTE — Progress Notes (Signed)
Hypoglycemic Event  CBG:74  Treatment: D50 IV 25 mL  Symptoms: None  Follow-up CBG: Time:1758 CBG Result116  Possible Reasons for Event: Medication regimen: glucostabilizer   Comments/MD notified:Dr hendrickson    [name]  Remember to initiate Hypoglycemia Order Set & complete

## 2012-08-16 NOTE — Progress Notes (Signed)
TCTS BRIEF SICU PROGRESS NOTE  Day of Surgery  S/P Procedure(s) (LRB): CORONARY ARTERY BYPASS GRAFTING (CABG) (N/A)   Sedated on vent Episode of hypoglycemia earlier, last CBG 116 AAI paced, BP stable Chest tube output low UOP adequate  Plan: Continue routine early postop  [name] H 08/16/2012 6:48 PM

## 2012-08-16 NOTE — Anesthesia Procedure Notes (Signed)
Procedure Name: Intubation Date/Time: 08/16/2012 8:09 AM Performed by: Carmela Rima Pre-anesthesia Checklist: Patient identified, Timeout performed, Emergency Drugs available, Patient being monitored and Suction available Patient Re-evaluated:Patient Re-evaluated prior to inductionOxygen Delivery Method: Circle system utilized Preoxygenation: Pre-oxygenation with 100% oxygen Intubation Type: IV induction Ventilation: Mask ventilation without difficulty Laryngoscope Size: Mac and 4 Grade View: Grade I Tube type: Oral Tube size: 8.0 mm Number of attempts: 1 Placement Confirmation: ETT inserted through vocal cords under direct vision,  breath sounds checked- equal and bilateral and positive ETCO2 Secured at: 23 cm Tube secured with: Tape Dental Injury: Teeth and Oropharynx as per pre-operative assessment

## 2012-08-16 NOTE — Progress Notes (Signed)
Hypoglycemic Event  CBG:64  Treatment: D50 IV 25 mL  Symptoms: Pale and Nervous/irritable  Follow-up CBG: Time:pt sent to OR; CRNA notified CBG Result: follow up   Possible Reasons for Event: Inadequate meal intake  Comments/MD notified:CRNA in OR    Rikki Spearing  Remember to initiate Hypoglycemia Order Set & complete

## 2012-08-16 NOTE — Anesthesia Preprocedure Evaluation (Addendum)
Anesthesia Evaluation  Patient identified by MRN, date of birth, ID band Patient awake    Reviewed: Allergy & Precautions, H&P , NPO status , Patient's Chart, lab work & pertinent test results  Airway Mallampati: II TM Distance: >3 FB Neck ROM: Full    Dental  (+) Teeth Intact and Dental Advisory Given   Pulmonary neg pulmonary ROS,    Pulmonary exam normal       Cardiovascular hypertension, Pt. on medications + angina with exertion + CAD and + Past MI     Neuro/Psych negative neurological ROS     GI/Hepatic Neg liver ROS, hiatal hernia, GERD-  Medicated and Controlled,  Endo/Other  diabetes, Insulin Dependent  Renal/GU negative Renal ROS     Musculoskeletal   Abdominal   Peds  Hematology   Anesthesia Other Findings   Reproductive/Obstetrics                           Anesthesia Physical Anesthesia Plan  ASA: IV  Anesthesia Plan: General   Post-op Pain Management:    Induction: Intravenous  Airway Management Planned: Oral ETT  Additional Equipment: Arterial line, PA Cath and Ultrasound Guidance Line Placement  Intra-op Plan:   Post-operative Plan: Post-operative intubation/ventilation  Informed Consent: I have reviewed the patients History and Physical, chart, labs and discussed the procedure including the risks, benefits and alternatives for the proposed anesthesia with the patient or authorized representative who has indicated his/her understanding and acceptance.   Dental advisory given  Plan Discussed with: CRNA, Anesthesiologist and Surgeon  Anesthesia Plan Comments:         Anesthesia Quick Evaluation

## 2012-08-16 NOTE — Progress Notes (Signed)
Wean initiated. 

## 2012-08-16 NOTE — Progress Notes (Signed)
Pt transferred to OR on monitor with RN at bedside; belongings sent with wife. Emotional support given to pt & to family

## 2012-08-16 NOTE — Progress Notes (Signed)
Dr. Dorris Fetch notified of second hypoglycemic, Hypotensive and desaturation event; ABG obtained and reviewed with MD; Orders received and implemented. See vital signs, CBG and ABG results.

## 2012-08-16 NOTE — Brief Op Note (Addendum)
08/13/2012 - 08/16/2012  11:46 AM  PATIENT:  [name]  66 y.o. male  PRE-OPERATIVE DIAGNOSIS:   History of CAD (s/p NSTEMI, s/p PTCA with stent to RCA)  POST-OPERATIVE DIAGNOSIS: History of CAD (s/p NSTEMI, s/p PTCA with stent to RCA)  PROCEDURE:  CORONARY ARTERY BYPASS GRAFTING (CABG) x 4 (LIMA to LAD, SVG to OM2, SVG sequentially to Acute Marginal and PDA) with EVH from the right thigh and partial calf.  SURGEON:  Surgeon(s) and Role:    * Loreli Slot, MD - Primary  PHYSICIAN ASSISTANT: Doree Fudge PA-C  ASSISTANTS: Sherrie George RNFA  ANESTHESIA:   general  EBL:  Total I/O In: 2900 [I.V.:2900] Out: 150 [Urine:150]   DRAINS:  Chest Tube(s) in the Mediastinal and Pleural spaces    COUNTS CORRECT:  YES  DICTATION: .Dragon Dictation  PLAN OF CARE: Admit to inpatient   PATIENT DISPOSITION:  ICU - intubated and hemodynamically stable.   Delay start of Pharmacological VTE agent (>24hrs) due to surgical blood loss or risk of bleeding: yes  PRE OP WEIGHT: 93 kg  XC= 78 min  CPB= 110 min, weaned DDD paced, no inotropes  TEE- mild MR pre and post bypass  LAD diffusely diseased distal to graft AM good quality, PDA poor, OM2 fair

## 2012-08-16 NOTE — H&P (Signed)
Reason for Consult: 3 vessel CAD with unstable angina Referring Physician: Dr. Jordan  [name] is an 66 y.o. male.  HPI: 66 year-old male with a history of HTN, hyperlipidemia, diabetes and known CAD, who presents with a cc/o chest pain.  His history of CAD dates back to 2001 when he had a NSTEMI treated with PTCA and stenting of his RCA. He has not had any regular cardiology followup since being treated for his MI in 2001.   He presented to [facility] ED on the morning of 08/13/12 after experiencing persistent CP unrelieved with antacids. He had been having episodes dating back to 6 days PTA. His first episode occurred after he had changed a tire. It was associated with nausea and vomiting, and the anterior chest discomfort was reminiscent of his MI presentation, although not as severe and with no associated LUE radiation. The morning of admission he had a similar episode of chest discomfort and diaphoresis, although without the associated nausea/vomiting, after eating breakfast. He did not have any NTG at home, but did take some TUMS, believing his pain might be indigestion. His symptoms persisted, and he was taken to the ED by his wife. He was treated with 4 baby aspirin, 3 NTG tablets, nitro paste, and subsequently placed on both intravenous NTG and heparin. His pain did then resolve. Initial cardiac markers showed a troponin I of 0.20. EKG showed NSR with an old inferior MI, but no acute changes.  He was transferred to [facility]. Yesterday he underwent cardiac cath and was found to have severe 3 vessel CAD with an EF of 35-40%. He has been pain free since admission.  Past Medical History  Diagnosis Date  . CAD (coronary artery disease)   . HTN (hypertension)   . HLD (hyperlipidemia)   . GERD (gastroesophageal reflux disease)   . Hiatal hernia   . Prostate cancer     Status post radiation treatment for  . Tobacco abuse   . Myocardial infarction   . Diabetes mellitus   . Peripheral  vascular disease   . Anginal pain     Past Surgical History  Procedure Date  . Back surgery   . Appendectomy     History reviewed. No pertinent family history.  Social History:  reports that he quit smoking about 39 years ago. He does not have any smokeless tobacco history on file. He reports that he does not drink alcohol or use illicit drugs.  Allergies: No Known Allergies  Medications:  Prior to Admission:  Prescriptions prior to admission  Medication Sig Dispense Refill  . atorvastatin (LIPITOR) 10 MG tablet Take 10 mg by mouth at bedtime.      . insulin glargine (LANTUS) 100 UNIT/ML injection Inject 40 Units into the skin at bedtime.      . lisinopril (PRINIVIL,ZESTRIL) 10 MG tablet Take 10 mg by mouth daily.      . metFORMIN (GLUCOPHAGE-XR) 500 MG 24 hr tablet Take 1,000 mg by mouth 2 (two) times daily.       . Tamsulosin HCl (FLOMAX) 0.4 MG CAPS Take 0.4 mg by mouth daily after supper.        Results for orders placed during the hospital encounter of 08/13/12 (from the past 48 hour(s))  MRSA PCR SCREENING     Status: Normal   Collection Time   08/13/12  3:11 PM      Component Value Range Comment   MRSA by PCR NEGATIVE  NEGATIVE   GLUCOSE, CAPILLARY       Status: Normal   Collection Time   08/13/12  4:45 PM      Component Value Range Comment   Glucose-Capillary 99  70 - 99 mg/dL   HEMOGLOBIN A1C     Status: Abnormal   Collection Time   08/13/12  4:53 PM      Component Value Range Comment   Hemoglobin A1C 7.6 (*) <5.7 %    Mean Plasma Glucose 171 (*) <117 mg/dL   TROPONIN I     Status: Abnormal   Collection Time   08/13/12  4:53 PM      Component Value Range Comment   Troponin I 0.49 (*) <0.30 ng/mL   HEPARIN LEVEL (UNFRACTIONATED)     Status: Abnormal   Collection Time   08/13/12  4:53 PM      Component Value Range Comment   Heparin Unfractionated 0.23 (*) 0.30 - 0.70 IU/mL   GLUCOSE, CAPILLARY     Status: Abnormal   Collection Time   08/13/12  9:08 PM      Component  Value Range Comment   Glucose-Capillary 155 (*) 70 - 99 mg/dL    Comment 1 Notify RN     GLUCOSE, CAPILLARY     Status: Abnormal   Collection Time   08/13/12  9:51 PM      Component Value Range Comment   Glucose-Capillary 196 (*) 70 - 99 mg/dL   TROPONIN I     Status: Abnormal   Collection Time   08/13/12 11:13 PM      Component Value Range Comment   Troponin I 0.68 (*) <0.30 ng/mL   HEPARIN LEVEL (UNFRACTIONATED)     Status: Normal   Collection Time   08/13/12 11:13 PM      Component Value Range Comment   Heparin Unfractionated 0.43  0.30 - 0.70 IU/mL   TROPONIN I     Status: Abnormal   Collection Time   08/14/12  3:25 AM      Component Value Range Comment   Troponin I 0.52 (*) <0.30 ng/mL   HEPARIN LEVEL (UNFRACTIONATED)     Status: Normal   Collection Time   08/14/12  3:25 AM      Component Value Range Comment   Heparin Unfractionated 0.32  0.30 - 0.70 IU/mL   CBC     Status: Abnormal   Collection Time   08/14/12  3:25 AM      Component Value Range Comment   WBC 10.8 (*) 4.0 - 10.5 K/uL    RBC 5.28  4.22 - 5.81 MIL/uL    Hemoglobin 16.3  13.0 - 17.0 g/dL    HCT 47.2  39.0 - 52.0 %    MCV 89.4  78.0 - 100.0 fL    MCH 30.9  26.0 - 34.0 pg    MCHC 34.5  30.0 - 36.0 g/dL    RDW 12.9  11.5 - 15.5 %    Platelets 182  150 - 400 K/uL   LIPID PANEL     Status: Abnormal   Collection Time   08/14/12  3:25 AM      Component Value Range Comment   Cholesterol 141  0 - 200 mg/dL    Triglycerides 151 (*) <150 mg/dL    HDL 33 (*) >39 mg/dL    Total CHOL/HDL Ratio 4.3      VLDL 30  0 - 40 mg/dL    LDL Cholesterol 78  0 - 99 mg/dL   GLUCOSE, CAPILLARY       Status: Abnormal   Collection Time   08/14/12  8:50 AM      Component Value Range Comment   Glucose-Capillary 107 (*) 70 - 99 mg/dL   GLUCOSE, CAPILLARY     Status: Normal   Collection Time   08/14/12 10:35 AM      Component Value Range Comment   Glucose-Capillary 79  70 - 99 mg/dL   GLUCOSE, CAPILLARY     Status: Normal   Collection  Time   08/14/12 11:53 AM      Component Value Range Comment   Glucose-Capillary 95  70 - 99 mg/dL    Comment 1 Notify RN      Comment 2 Documented in Chart     GLUCOSE, CAPILLARY     Status: Abnormal   Collection Time   08/14/12  4:36 PM      Component Value Range Comment   Glucose-Capillary 123 (*) 70 - 99 mg/dL   GLUCOSE, CAPILLARY     Status: Abnormal   Collection Time   08/14/12  8:53 PM      Component Value Range Comment   Glucose-Capillary 117 (*) 70 - 99 mg/dL    Comment 1 Documented in Chart      Comment 2 Notify RN     CBC     Status: Normal   Collection Time   08/15/12  5:00 AM      Component Value Range Comment   WBC 9.6  4.0 - 10.5 K/uL    RBC 5.19  4.22 - 5.81 MIL/uL    Hemoglobin 15.9  13.0 - 17.0 g/dL    HCT 47.1  39.0 - 52.0 %    MCV 90.8  78.0 - 100.0 fL    MCH 30.6  26.0 - 34.0 pg    MCHC 33.8  30.0 - 36.0 g/dL    RDW 13.2  11.5 - 15.5 %    Platelets 166  150 - 400 K/uL   HEPARIN LEVEL (UNFRACTIONATED)     Status: Abnormal   Collection Time   08/15/12  5:00 AM      Component Value Range Comment   Heparin Unfractionated 0.19 (*) 0.30 - 0.70 IU/mL   GLUCOSE, CAPILLARY     Status: Normal   Collection Time   08/15/12  7:55 AM      Component Value Range Comment   Glucose-Capillary 75  70 - 99 mg/dL   GLUCOSE, CAPILLARY     Status: Abnormal   Collection Time   08/15/12 11:39 AM      Component Value Range Comment   Glucose-Capillary 123 (*) 70 - 99 mg/dL   HEPARIN LEVEL (UNFRACTIONATED)     Status: Abnormal   Collection Time   08/15/12  1:08 PM      Component Value Range Comment   Heparin Unfractionated 0.10 (*) 0.30 - 0.70 IU/mL     No results found.  Review of Systems  Constitutional: Positive for malaise/fatigue and diaphoresis. Negative for fever and chills.  Cardiovascular: Positive for chest pain. Negative for orthopnea, leg swelling and PND.  Gastrointestinal: Positive for heartburn, nausea and vomiting.  Genitourinary: Positive for frequency.  All other  systems reviewed and are negative.   Blood pressure 108/65, pulse 73, temperature 97.9 F (36.6 C), temperature source Oral, resp. rate 17, height 5' 9" (1.753 m), weight 204 lb 12.9 oz (92.9 kg), SpO2 95.00%. Physical Exam  Vitals reviewed. Constitutional: He is oriented to person, place, and time. He appears well-developed and well-nourished.   No distress.  HENT:  Head: Normocephalic and atraumatic.  Eyes: EOM are normal. Pupils are equal, round, and reactive to light.  Neck: Neck supple. No thyromegaly present.  Cardiovascular: Normal rate, regular rhythm and intact distal pulses.   Murmur (2/6 systolic) heard. Respiratory: Effort normal and breath sounds normal. He has no wheezes. He has no rales.  GI: Soft. There is no tenderness.  Musculoskeletal: He exhibits no edema.  Lymphadenopathy:    He has no cervical adenopathy.  Neurological: He is alert and oriented to person, place, and time. No cranial nerve deficit.  Skin: Skin is warm and dry.  Psychiatric: He has a normal mood and affect.    Assessment/Plan: 66 yo male with an unstable coronary syndrome who has been found to have severe 3 vessel CAD and impaired LV function. CABG is the best treatment option in terms of survival benefit and relief of symptoms. He is at risk for increased rate of graft failure due to diffusely diseased vessels.  I have discussed with the patient and his family the general nature of the procedure, need for general anesthesia, and incisions to be used. I discussed the expected hospital stay, overall recovery and short and long term outcomes. They understand the risks include but are not limited to death, stroke, MI, DVT/PE, bleeding, possible need for transfusion, infections, cardiac arrhythmias, and other organ system dysfunction including respiratory, renal, or GI complications. He accepts the risks and agrees to proceed.  Plan CABG in AM 9/5  Kynadee Dam C 08/15/2012, 2:06 PM      

## 2012-08-16 NOTE — Interval H&P Note (Signed)
History and Physical Interval Note:  08/16/2012 7:59 AM  [name]  has presented today for surgery, with the diagnosis of cad  The various methods of treatment have been discussed with the patient and family. After consideration of risks, benefits and other options for treatment, the patient has consented to  Procedure(s) (LRB) with comments: CORONARY ARTERY BYPASS GRAFTING (CABG) (N/A) as a surgical intervention .  The patient's history has been reviewed, patient examined, no change in status, stable for surgery.  I have reviewed the patient's chart and labs.  Questions were answered to the patient's satisfaction.     [name]

## 2012-08-16 NOTE — Transfer of Care (Signed)
Immediate Anesthesia Transfer of Care Note  Patient: [name]  Procedure(s) Performed: Procedure(s) (LRB) with comments: CORONARY ARTERY BYPASS GRAFTING (CABG) (N/A)  Patient Location: SICU  Anesthesia Type: General  Level of Consciousness: Patient remains intubated per anesthesia plan  Airway & Oxygen Therapy: Patient remains intubated per anesthesia plan and Patient placed on Ventilator (see vital sign flow sheet for setting)  Post-op Assessment: Post -op Vital signs reviewed and stable  Post vital signs: Reviewed and stable  Complications: No apparent anesthesia complications

## 2012-08-16 NOTE — Progress Notes (Signed)
  Echocardiogram Echocardiogram Transesophageal has been performed.  Georgian Co 08/16/2012, 9:11 AM

## 2012-08-16 NOTE — Progress Notes (Signed)
Hypoglycemic Event  CBG: 50  Treatment: D50 20ml  Symptoms: None  Follow-up CBG: Time:1631 CBG Result:96  Possible Reasons for Event: Medication regimen: on glucostabilizer  Comments/MD notified:Dr hendrickson made aware    Dell Ponto  Remember to initiate Hypoglycemia Order Set & complete

## 2012-08-16 NOTE — Anesthesia Postprocedure Evaluation (Signed)
Anesthesia Post Note  Patient: [name]  Procedure(s) Performed: Procedure(s) (LRB): CORONARY ARTERY BYPASS GRAFTING (CABG) (N/A)  Anesthesia type: General  Patient location: ICU  Post pain: Pain level controlled  Post assessment: Post-op Vital signs reviewed  Last Vitals:  Filed Vitals:   08/16/12 1325  BP: 112/89  Pulse: 91  Temp:   Resp: 12    Post vital signs: stable  Level of consciousness: Patient remains intubated per anesthesia plan  Complications: No apparent anesthesia complications

## 2012-08-17 ENCOUNTER — Inpatient Hospital Stay (HOSPITAL_COMMUNITY): Payer: Medicare Other

## 2012-08-17 ENCOUNTER — Encounter (HOSPITAL_COMMUNITY): Payer: Self-pay | Admitting: Thoracic Surgery (Cardiothoracic Vascular Surgery)

## 2012-08-17 LAB — GLUCOSE, CAPILLARY
Glucose-Capillary: 120 mg/dL — ABNORMAL HIGH (ref 70–99)
Glucose-Capillary: 130 mg/dL — ABNORMAL HIGH (ref 70–99)

## 2012-08-17 LAB — POCT I-STAT, CHEM 8
BUN: 19 mg/dL (ref 6–23)
Calcium, Ion: 1.41 mmol/L — ABNORMAL HIGH (ref 1.13–1.30)
Creatinine, Ser: 1.1 mg/dL (ref 0.50–1.35)
TCO2: 25 mmol/L (ref 0–100)

## 2012-08-17 LAB — CREATININE, SERUM
GFR calc Af Amer: >90 mL/min (ref >90)
GFR calc non Af Amer: 83 mL/min — ABNORMAL LOW (ref >90)

## 2012-08-17 LAB — POCT I-STAT 3, ART BLOOD GAS (G3+)
pCO2 arterial: 45.2 mmHg — ABNORMAL HIGH (ref 35.0–45.0)
pCO2 arterial: 47.2 mmHg — ABNORMAL HIGH (ref 35.0–45.0)
pH, Arterial: 7.315 — ABNORMAL LOW (ref 7.350–7.450)
pH, Arterial: 7.328 — ABNORMAL LOW (ref 7.350–7.450)

## 2012-08-17 LAB — BASIC METABOLIC PANEL
Calcium: 9.4 mg/dL (ref 8.4–10.5)
GFR calc Af Amer: >90 mL/min (ref >90)
GFR calc non Af Amer: 84 mL/min — ABNORMAL LOW (ref >90)
Potassium: 4.5 mEq/L (ref 3.5–5.1)
Sodium: 140 mEq/L (ref 135–145)

## 2012-08-17 LAB — CBC
HCT: 38.7 % — ABNORMAL LOW (ref 39.0–52.0)
Hemoglobin: 13 g/dL (ref 13.0–17.0)
Hemoglobin: 12.8 g/dL — ABNORMAL LOW (ref 13.0–17.0)
MCH: 30.8 pg (ref 26.0–34.0)
MCHC: 33.9 g/dL (ref 30.0–36.0)
Platelets: 128 10*3/uL — ABNORMAL LOW (ref 150–400)
RBC: 4.22 MIL/uL (ref 4.22–5.81)
WBC: 13.4 10*3/uL — ABNORMAL HIGH (ref 4.0–10.5)

## 2012-08-17 LAB — MAGNESIUM
Magnesium: 2.4 mg/dL (ref 1.5–2.5)
Magnesium: 2.4 mg/dL (ref 1.5–2.5)

## 2012-08-17 MED ORDER — HALOPERIDOL LACTATE 5 MG/ML IJ SOLN
INTRAMUSCULAR | Status: AC
  Administered 2012-08-17: 4 mg via INTRAVENOUS
  Filled 2012-08-17: qty 1

## 2012-08-17 MED ORDER — METOCLOPRAMIDE HCL 5 MG/ML IJ SOLN
10.0000 mg | Freq: Four times a day (QID) | INTRAMUSCULAR | Status: AC
  Administered 2012-08-17 – 2012-08-18 (×4): 10 mg via INTRAVENOUS
  Filled 2012-08-17 (×4): qty 2

## 2012-08-17 MED ORDER — ENOXAPARIN SODIUM 40 MG/0.4ML ~~LOC~~ SOLN
40.0000 mg | SUBCUTANEOUS | Status: DC
  Administered 2012-08-17 – 2012-08-21 (×5): 40 mg via SUBCUTANEOUS
  Filled 2012-08-17 (×6): qty 0.4

## 2012-08-17 MED ORDER — HALOPERIDOL LACTATE 5 MG/ML IJ SOLN
4.0000 mg | INTRAMUSCULAR | Status: DC | PRN
  Administered 2012-08-17 – 2012-08-19 (×7): 4 mg via INTRAVENOUS
  Filled 2012-08-17 (×7): qty 1

## 2012-08-17 MED ORDER — INSULIN ASPART 100 UNIT/ML ~~LOC~~ SOLN
0.0000 [IU] | SUBCUTANEOUS | Status: DC
  Administered 2012-08-17 – 2012-08-19 (×4): 2 [IU] via SUBCUTANEOUS

## 2012-08-17 MED ORDER — TAMSULOSIN HCL 0.4 MG PO CAPS
0.4000 mg | ORAL_CAPSULE | Freq: Every day | ORAL | Status: DC
  Administered 2012-08-18 – 2012-08-25 (×8): 0.4 mg via ORAL
  Filled 2012-08-17 (×10): qty 1

## 2012-08-17 MED ORDER — FUROSEMIDE 10 MG/ML IJ SOLN
40.0000 mg | Freq: Once | INTRAMUSCULAR | Status: AC
  Administered 2012-08-17: 40 mg via INTRAVENOUS

## 2012-08-17 MED ORDER — INSULIN GLARGINE 100 UNIT/ML ~~LOC~~ SOLN
25.0000 [IU] | Freq: Every day | SUBCUTANEOUS | Status: DC
  Administered 2012-08-17 – 2012-08-20 (×4): 25 [IU] via SUBCUTANEOUS

## 2012-08-17 MED FILL — Potassium Chloride Inj 2 mEq/ML: INTRAVENOUS | Qty: 40 | Status: AC

## 2012-08-17 MED FILL — Heparin Sodium (Porcine) Inj 1000 Unit/ML: INTRAMUSCULAR | Qty: 10 | Status: AC

## 2012-08-17 MED FILL — Sodium Chloride IV Soln 0.9%: INTRAVENOUS | Qty: 1000 | Status: AC

## 2012-08-17 MED FILL — Mannitol IV Soln 20%: INTRAVENOUS | Qty: 500 | Status: AC

## 2012-08-17 MED FILL — Heparin Sodium (Porcine) Inj 1000 Unit/ML: INTRAMUSCULAR | Qty: 30 | Status: AC

## 2012-08-17 MED FILL — Electrolyte-R (PH 7.4) Solution: INTRAVENOUS | Qty: 3000 | Status: AC

## 2012-08-17 MED FILL — Magnesium Sulfate Inj 50%: INTRAMUSCULAR | Qty: 10 | Status: AC

## 2012-08-17 MED FILL — Sodium Chloride Irrigation Soln 0.9%: Qty: 3000 | Status: AC

## 2012-08-17 MED FILL — Lidocaine HCl IV Inj 20 MG/ML: INTRAVENOUS | Qty: 5 | Status: AC

## 2012-08-17 MED FILL — Sodium Bicarbonate IV Soln 8.4%: INTRAVENOUS | Qty: 50 | Status: AC

## 2012-08-17 NOTE — Progress Notes (Signed)
Pt attempting to get out of bed, pulling at lines and being argumentative.  Dr Donata Clay notified, received order for Haldol IV prn agitation. Sitter at bedside. Bed alarm on.

## 2012-08-17 NOTE — Progress Notes (Signed)
Patient ID: [name], male   DOB: October 16, 1946, 66 y.o.   MRN: [id number]    Subjective:  Post CABG pain   Objective:  Filed Vitals:   08/17/12 0545 08/17/12 0600 08/17/12 0657 08/17/12 0700  BP: 101/63 111/75  129/73  Pulse: 90 90 90 90  Temp: 99 F (37.2 C) 98.8 F (37.1 C) 98.8 F (37.1 C) 98.6 F (37 C)  TempSrc:      Resp: 13 9 12 16   Height:      Weight:      SpO2: 96% 95% 96% 96%    Intake/Output from previous day:  Intake/Output Summary (Last 24 hours) at 08/17/12 0756 Last data filed at 08/17/12 0700  Gross per 24 hour  Intake 7961.7 ml  Output   5215 ml  Net 2746.7 ml    Physical Exam: Affect appropriate Healthy:  appears stated age HEENT: normal Neck supple with no adenopathy JVP normal no bruits no thyromegaly Lungs clear with no wheezing and good diaphragmatic motion Heart:  S1/S2 no murmur, no rub, gallop or click PMI normal Abdomen: benighn, BS positve, no tenderness, no AAA no bruit.  No HSM or HJR Distal pulses intact with no bruits No edema Neuro non-focal Skin warm and dry No muscular weakness Chest tubes and Swan still in  Lab Results: Basic Metabolic Panel:  Basename 08/17/12 0439 08/16/12 1945 08/16/12 1944 08/16/12 0515  NA 140 -- 143 --  K 4.5 -- 4.3 --  CL 107 -- 107 --  CO2 25 -- -- 26  GLUCOSE 141* -- 104* --  BUN 14 -- 12 --  CREATININE 0.93 0.91 -- --  CALCIUM 9.4 -- -- 10.2  MG 2.4 2.8* -- --  PHOS -- -- -- --   Liver Function Tests:  Basename 08/15/12 1453  AST 15  ALT 13  ALKPHOS 63  BILITOT 0.9  PROT 6.2  ALBUMIN 3.3*   No results found for this basename: LIPASE:2,AMYLASE:2 in the last 72 hours CBC:  Basename 08/17/12 0439 08/16/12 1945  WBC 12.8* 10.6*  NEUTROABS -- --  HGB 13.0 12.3*  HCT 38.3* 35.9*  MCV 90.8 89.5  PLT 128* 110*    Imaging: Dg Chest 2 View  08/15/2012  *RADIOLOGY REPORT*  Clinical Data: Preop for CABG  CHEST - 2 VIEW  Comparison: Portable chest x-ray of 08/13/2012   Findings: There is cardiomegaly present with moderate pulmonary vascular congestion and small pleural effusions, consistent with mild congestive heart failure.  No acute bony abnormality is seen.  IMPRESSION: Mild CHF with cardiomegaly, pulmonary vascular congestion, and small pleural effusions.   Original Report Authenticated By: Juline Patch, M.D.    Dg Chest Portable 1 View  08/16/2012  *RADIOLOGY REPORT*  Clinical Data: Coronary bypass  PORTABLE CHEST - 1 VIEW  Comparison: 08/15/2012  Findings: Endotracheal tube 5.6 cm above the carina.  Swan-Ganz catheter in the central pulmonary outflow tract directed to the right.  Mediastinal drain, NG tube and left chest tube noted in good position.  Stable heart size with mild diffuse interstitial edema and basilar atelectasis.  Small effusions not excluded.  No pneumothorax.  IMPRESSION: Support apparatus in good position.  Mild interstitial edema pattern and basilar atelectasis worse on the left.   Original Report Authenticated By: Judie Petit. Ruel Favors, M.D.     Cardiac Studies:  ECG:    Telemetry: NSR no afib or heart block 08/17/2012   Echo:   Medications:     . acetaminophen (TYLENOL) oral  liquid 160 mg/5 mL  650 mg Per Tube NOW   Or  . acetaminophen  650 mg Rectal NOW  . acetaminophen  1,000 mg Oral Q6H   Or  . acetaminophen (TYLENOL) oral liquid 160 mg/5 mL  975 mg Per Tube Q6H  . aspirin EC  325 mg Oral Daily   Or  . aspirin  324 mg Per Tube Daily  . atorvastatin  80 mg Oral QHS  . bisacodyl  10 mg Oral Daily   Or  . bisacodyl  10 mg Rectal Daily  . calcium chloride  1 g Intravenous Once  . cefUROXime (ZINACEF)  IV  1.5 g Intravenous To OR  . cefUROXime (ZINACEF)  IV  1.5 g Intravenous Q12H  . dexmedetomidine  0.1-0.7 mcg/kg/hr Intravenous To OR  . dextrose      . docusate sodium  200 mg Oral Daily  . DOPamine  3 mcg/kg/min Intravenous To OR  . enoxaparin (LOVENOX) injection  40 mg Subcutaneous Q24H  . famotidine (PEPCID) IV  20 mg  Intravenous Q12H  . furosemide  40 mg Intravenous Once  . insulin aspart  0-24 Units Subcutaneous Q2H  . insulin aspart  0-24 Units Subcutaneous Q4H  . insulin glargine  25 Units Subcutaneous Daily  . insulin (NOVOLIN-R) infusion   Intravenous To OR  . magnesium sulfate  4 g Intravenous Once  . metoprolol tartrate  12.5 mg Oral BID   Or  . metoprolol tartrate  12.5 mg Per Tube BID  . nitroglycerin-nicardipine-HEPARIN-sodium bicarbonate irrigation for artery spasm   Irrigation To OR  . pantoprazole  40 mg Oral Q1200  . potassium chloride  10 mEq Intravenous Q1 Hr x 3  . sodium bicarbonate  50 mEq Intravenous Once  . sodium chloride  3 mL Intravenous Q12H  . Tamsulosin HCl  0.4 mg Oral QPC supper  . tranexamic acid  15 mg/kg Intravenous To OR  . tranexamic acid (CYKLOKAPRON) infusion ([facility])  1.5 mg/kg/hr Intravenous To OR  . vancomycin  1,000 mg Intravenous Once  . DISCONTD: aspirin EC  81 mg Oral Daily  . DISCONTD: cefUROXime (ZINACEF)  IV  1.5 g Intravenous Q12H  . DISCONTD: cefUROXime (ZINACEF)  IV  1.5 g Intravenous Q12H  . DISCONTD: cefUROXime (ZINACEF)  IV  750 mg Intravenous To OR  . DISCONTD: DOPamine  2-20 mcg/kg/min Intravenous To OR  . DISCONTD: epinephrine  0.5-20 mcg/min Intravenous To OR  . DISCONTD: HYDROcodone-acetaminophen  1 tablet Oral Q6H  . DISCONTD: insulin aspart  0-15 Units Subcutaneous TID WC  . DISCONTD: insulin aspart  0-24 Units Subcutaneous Q4H  . DISCONTD: insulin aspart  0-5 Units Subcutaneous QHS  . DISCONTD: insulin glargine  40 Units Subcutaneous QHS  . DISCONTD: insulin regular  0-10 Units Intravenous TID WC  . DISCONTD: lisinopril  10 mg Oral Daily  . DISCONTD: magnesium sulfate  40 mEq Other To OR  . DISCONTD: metoprolol tartrate  12.5 mg Per Tube BID  . DISCONTD: metoprolol tartrate  12.5 mg Oral BID  . DISCONTD: metoprolol tartrate  25 mg Oral BID  . DISCONTD: nitroGLYCERIN  2-200 mcg/min Intravenous To OR  . DISCONTD: phenylephrine  (NEO-SYNEPHRINE) Adult infusion  30-200 mcg/min Intravenous To OR  . DISCONTD: potassium chloride  80 mEq Other To OR  . DISCONTD: Tamsulosin HCl  0.4 mg Oral QPC supper  . DISCONTD: Tamsulosin HCl  0.4 mg Oral QPC supper  . DISCONTD: tranexamic acid  2 mg/kg Intracatheter To OR       .  sodium chloride 20 mL/hr (08/16/12 1444)  . sodium chloride 20 mL/hr (08/16/12 1443)  . sodium chloride    . dexmedetomidine Stopped (08/16/12 1533)  . lactated ringers 20 mL/hr (08/16/12 1533)  . nitroGLYCERIN Stopped (08/16/12 1533)  . phenylephrine (NEO-SYNEPHRINE) Adult infusion Stopped (08/17/12 0700)  . DISCONTD: sodium chloride 10 mL/hr at 08/15/12 1900  . DISCONTD: heparin 1,650 Units/hr (08/15/12 1900)  . DISCONTD: insulin (NOVOLIN-R) infusion Stopped (08/16/12 1445)  . DISCONTD: nitroGLYCERIN 5 mcg/min (08/16/12 0630)    Assessment/Plan:  CAD:  Post CABG.  Poor targets per Dr Dorris Fetch.  Stable  EF normal on preop echo and intraop TEE Lasix today and D/C lines  Stable   Charlton Haws 08/17/2012, 7:56 AM

## 2012-08-17 NOTE — Progress Notes (Signed)
1 Day Post-Op Procedure(s) (LRB): CORONARY ARTERY BYPASS GRAFTING (CABG) (N/A) Subjective: Feels sore and some nausea  Objective: Vital signs in last 24 hours: Temp:  [96.8 F (36 C)-100.4 F (38 C)] 98.6 F (37 C) (09/06 0700) Pulse Rate:  [54-93] 90  (09/06 0700) Cardiac Rhythm:  [-] Atrial paced (09/06 0700) Resp:  [7-22] 16  (09/06 0700) BP: (81-129)/(56-89) 129/73 mmHg (09/06 0700) SpO2:  [91 %-100 %] 96 % (09/06 0700) Arterial Line BP: (83-142)/(51-76) 116/65 mmHg (09/06 0700) FiO2 (%):  [39.8 %-70.2 %] 70.2 % (09/05 2215) Weight:  [215 lb 9.8 oz (97.8 kg)] 215 lb 9.8 oz (97.8 kg) (09/06 0340)  Hemodynamic parameters for last 24 hours: PAP: (20-49)/(14-36) 40/29 mmHg CO:  [3.7 L/min-6.4 L/min] 6.4 L/min CI:  [1.8 L/min/m2-3 L/min/m2] 3 L/min/m2  Intake/Output from previous day: 09/05 0701 - 09/06 0700 In: 7961.7 [I.V.:5783.7; Blood:594; NG/GT:30; IV Piggyback:1554] Out: 5215 [Urine:3075; Emesis/NG output:200; Blood:1100; Chest Tube:840] Intake/Output this shift:    General appearance: alert and mild distress Neurologic: intact Heart: regular rate and rhythm Lungs: diminished breath sounds bibasilar Abdomen: distended, tympanitic, nontender  Lab Results:  Basename 08/17/12 0439 08/16/12 1945  WBC 12.8* 10.6*  HGB 13.0 12.3*  HCT 38.3* 35.9*  PLT 128* 110*   BMET:  Basename 08/17/12 0439 08/16/12 1945 08/16/12 1944 08/16/12 0515  NA 140 -- 143 --  K 4.5 -- 4.3 --  CL 107 -- 107 --  CO2 25 -- -- 26  GLUCOSE 141* -- 104* --  BUN 14 -- 12 --  CREATININE 0.93 0.91 -- --  CALCIUM 9.4 -- -- 10.2    PT/INR:  Basename 08/16/12 1330  LABPROT 18.8*  INR 1.54*   ABG    Component Value Date/Time   PHART 7.315* 08/17/2012 0031   HCO3 24.0 08/17/2012 0031   TCO2 25 08/17/2012 0031   ACIDBASEDEF 2.0 08/17/2012 0031   O2SAT 93.0 08/17/2012 0031   CBG (last 3)   Basename 08/17/12 0356 08/17/12 0215 08/17/12  GLUCAP 129* 120* 113*    Assessment/Plan: S/P  Procedure(s) (LRB): CORONARY ARTERY BYPASS GRAFTING (CABG) (N/A) POD # 1 CABG x 4 CV- s/p CABG, diffusely diseased targets, good index- d/c swan  ASA, beta blocker, statin, restart ACE-I in a couple of days  RESP- bibasilar atelectasis, ? Effusions on CXR- pulmonary toilet  RENAL- creatinine and lytes OK, total body volume overloaded- diurese  Continue dopamine for now to assist with diuresis  ENDO- CBG well controlled presently after having hypoglycemia last night  Lantus, ssi, restart metformin in 24-48 hours  THROMBOCYTOPENIA- improving  DVT PROPHYLAXIS- PAS in place, will start lovenox today  D/C CT  OOB   LOS: 4 days    [name] C 08/17/2012

## 2012-08-17 NOTE — Progress Notes (Addendum)
Attempted NG tube on both nares and was unsuccessful.  Patient refuses anyone else to try at this time.  Will continue to monitor.

## 2012-08-17 NOTE — Progress Notes (Signed)
Patient found getting out of chair on his own.  In the process pt pulled out NGT.  Assisted pt to bed and educated on the need to wait for assistance, not to get up on his own.  Bed alarm turned on.  Dr Maren Beach notified. Wife notified.

## 2012-08-17 NOTE — Op Note (Signed)
[name], [name] NO.:  [id number]  MEDICAL RECORD NO.:  [id number]  LOCATION:  2310                         FACILITY:  [facility]  PHYSICIAN:  [name], M.D.DATE OF BIRTH:  1946/11/30  DATE OF PROCEDURE:  08/16/2012 DATE OF DISCHARGE:                              OPERATIVE REPORT   PREOPERATIVE DIAGNOSES: 1. Severe three-vessel coronary artery disease with unstable coronary     syndrome. 2. Mild-to-moderate mitral regurgitation.  POSTOPERATIVE DIAGNOSES: 1. Three-vessel coronary artery disease with unstable coronary     syndrome. 2. Mild mitral regurgitation.  PROCEDURES:  Median sternotomy, extracorporeal circulation; coronary artery bypass grafting x4 (left internal mammary artery to LAD, saphenous vein graft to obtuse marginal 2, sequential saphenous vein graft to acute marginal and posterior descending); endoscopic vein harvest, right leg.  SURGEON:  Tanner Decent. Dorris Fetch, MD  ASSISTANT:  Doree Fudge, PA  ANESTHESIA:  General.  FINDINGS:  Transesophageal echocardiography revealed mild mitral regurgitation pre and post-bypass.  LAD good quality at the site of the anastomosis but diffusely diseased distal to that.  Acute marginal large vessel traversed to the distal septum and was the dominant inferior branch and was the vessel seen filling via collaterals on catheterization. Posterior descending 1 mm poor quality target, OM2 of 1.5 mm fair quality target.  CLINICAL NOTE:  [name] is a 66 year old gentleman with known coronary artery disease, who presented with unstable anginal symptoms. A catheterization was found to have severe three-vessel coronary artery disease and impaired left ventricular function.  He was advised to have coronary artery bypass grafting for survival benefit as well as relief of symptoms.  The indications risks, benefits, and alternative treatments were discussed in detail with the patient, and he  accepted the risk and agreed to proceed.  OPERATIVE NOTE:  [name] was brought to the preoperative holding area on August 16, 2012.  There, the Anesthesia Service placed a Swan-Ganz catheter and arterial blood pressure monitoring line.  Intravenous antibiotics were administered.  He was taken to the operating room, anesthetized, and intubated.  The chest, abdomen, and legs were prepped and draped in the usual sterile fashion.  Transesophageal echocardiography was performed.  The preoperative study had shown mild- to-moderate mitral regurgitation, but I was unable to view the films prior to the procedure.  Intraoperative prebypass transesophageal echocardiography revealed moderately impaired left ventricular function with ejection fraction of approximately 40%.  There was mild mitral regurgitation  even with provocative measures of raising the blood pressure with peripheral vasoconstriction.  The median sternotomy was performed, and the left internal mammary artery was harvested using standard technique.  The patient did have a relatively osteoporotic sternum.  The mammary artery was a good quality vessel.  Simultaneously, an incision was made in the medial aspect of the right leg at the level of the knee.  The greater saphenous vein was harvested endoscopically from the right thigh.  Near the knee, the vein branched in multiple locations and an open incision was done at that area as well as bridged open incisions below the knee.  The vein was of fair quality.  2000 units of heparin was administered during the vessel harvest, and the  remainder of the full heparin dose was given prior to opening the pericardium.  The pericardium was opened.  The ascending aorta was inspected.  There was no palpable atherosclerotic disease.  The aorta was cannulated via concentric 2-0 Ethibond pledgeted pursestring suture.  A dual stage venous cannula was placed via pursestring suture in the right  atrial appendage.  Cardiopulmonary bypass was instituted, and the patient was cooled to 32 degrees Celsius.  The coronary arteries were inspected and anastomotic sites were chosen.  The conduits were inspected and cut to length.  A foam pad was placed in the pericardium to insulate the heart and protect the left phrenic nerve.  A temperature probe was placed in myocardial septum and a cardioplegia cannula was placed in the ascending aorta.  The aorta was crossclamped.  The left ventricle was emptied via the aortic root vent.  Cardiac arrest then was achieved with a combination of cold antegrade blood cardioplegia and topical iced saline. 1.5 L of cardioplegia was administered.  There was a rapid diastolic arrest and good cooling to 12 degrees Celsius.  The following distal anastomoses were performed.  First, a reversed saphenous vein graft was placed sequentially to the acute marginal and posterior descending branches of the right coronary. The right coronary was totally occluded.  The acute marginal was the larger dominant branch of the inferior wall vessels by far the largest and best quality.  It was a 1.5 mm good quality target.  At the site of the anastomosis, it did traverse the inferior wall of the right ventricle to the apex.  A side-to-side anastomosis was performed to this vessel with a running 7-0 Prolene suture.  All anastomoses were probed proximally and distally at their completion to ensure patency with the distal end of the graft occluded.  There was excellent flow through this anastomosis.  The distal end of the graft then was beveled.  The posterior descending was then grafted at its takeoff from the distal right coronary.  It was a 1 mm poor quality target vessel.  The vein was anastomosed end-to-side with a running 7 Prolene suture.  A probe did pass distally at the completion of the anastomosis.  With cardioplegia administration,  there was good flow through the  graft and good hemostasis at both anastomoses.  Next, a reversed saphenous vein graft was placed end-to-side to obtuse marginal 2.  There was a large, obtuse marginal 1, which did not have any hemodynamically significant stenoses.  OM-2 had a tight stenosis in the circumflex.  There was a subtotal occlusion of the circumflex, but there were no graftable posterolateral branches distal to that stenosis. OM-2 was a fair quality target.  It did barely accept a 1.5 mm probe. The vein was anastomosed end-to-side with a running 7 Prolene suture. At completion of the anastomosis, there was good flow and good hemostasis.  Additional cardioplegia was administered down the vein grafts and the aortic root.  The left internal mammary artery then was brought through a window in the pericardium.  The distal end was beveled.  It was then anastomosed end-to-side to the mid LAD.  The LAD was intramyocardial at the site of the anastomosis.  Distal to the anastomosis, there was significant atherosclerotic plaque.  A 1 mm probe did pass distally to the apex.  A 1.5 mm probe passed easily proximally from the site of the anastomosis and the vessel was a 2 mm good quality target.  At the site of the anastomosis, the mammary  was a 2.5 mm good quality conduit.  The end-to-side anastomosis was performed with a running 8-0 Prolene suture. At the completion of the anastomosis, the bulldog clamp was removed. There was rapid septal rewarming.  The bulldog clamps were placed.  The mammary pedicle was tacked to the epicardial surface of the heart with 6- 0 Prolene sutures.  Additional cardioplegia was administered.  The vein grafts were cut to length.  The cardioplegia cannula was removed from the ascending aorta. Proximal vein graft anastomoses then were performed through 4.5 mm punch aortotomies with running 6-0 Prolene sutures.  At the completion of the final proximal anastomosis, the patient was placed in  Trendelenburg position.  Lidocaine was administered.  The bulldog clamp was again removed from the left mammary artery.  The aortic root was de-aired and the aortic crossclamp was removed.  Total crossclamp time was 78 minutes.  The patient was initially in heart block and did not require defibrillation.  While rewarming was completed, all proximal and distal anastomoses were inspected for hemostasis.  Epicardial pacing wires were placed on the right ventricle and right atrium.  DDD pacing was initiated at 90 beats per minute.  When the patient had rewarmed to a core temperature of 37 degrees Celsius, he was weaned from cardiopulmonary bypass on the first attempt, paced with no inotropic support.  The total bypass time was 110 minutes.  The initial cardiac index was greater than 2 liters/minute/sq. m, and the patient remained hemodynamically stable throughout the post-bypass period.  Post-bypass transesophageal echocardiography revealed no change in the mild mitral regurgitation and no change in the left ventricular function.  A test dose of protamine was administered and was well tolerated.  The atrial and aortic cannulae were removed.  The remainder of the protamine was administered without incident.  The chest was irrigated with warm saline.  Hemostasis was achieved.  The pericardium was reapproximated over the ascending aorta and base of the heart with interrupted 3-0 silk sutures.  The left pleural and single mediastinal chest tubes were placed through separate subcostal incisions.  The sternum was closed with interrupted heavy gauge double stainless steel wires.  The pectoralis fascia, subcutaneous tissue, and skin were closed in a standard fashion.  All sponge, needle, and instrument counts were correct at the end of the procedure.  There were no intraoperative complications, and the patient was taken from the operating room to the surgical intensive care unit in good  condition.     Tanner Decent Dorris [name], M.D.     SCH/MEDQ  D:  08/16/2012  T:  08/17/2012  Job:  [id number]

## 2012-08-18 ENCOUNTER — Inpatient Hospital Stay (HOSPITAL_COMMUNITY): Payer: Medicare Other

## 2012-08-18 LAB — CBC
MCH: 30.5 pg (ref 26.0–34.0)
MCHC: 33.4 g/dL (ref 30.0–36.0)
Platelets: 146 10*3/uL — ABNORMAL LOW (ref 150–400)
RBC: 4.1 MIL/uL — ABNORMAL LOW (ref 4.22–5.81)
RDW: 13.5 % (ref 11.5–15.5)

## 2012-08-18 LAB — GLUCOSE, CAPILLARY
Glucose-Capillary: 130 mg/dL — ABNORMAL HIGH (ref 70–99)
Glucose-Capillary: 113 mg/dL — ABNORMAL HIGH (ref 70–99)

## 2012-08-18 LAB — BASIC METABOLIC PANEL
BUN: 22 mg/dL (ref 6–23)
Calcium: 10.1 mg/dL (ref 8.4–10.5)
GFR calc non Af Amer: 72 mL/min — ABNORMAL LOW (ref >90)
Glucose, Bld: 105 mg/dL — ABNORMAL HIGH (ref 70–99)
Sodium: 141 mEq/L (ref 135–145)

## 2012-08-18 MED ORDER — AMIODARONE HCL IN DEXTROSE 360-4.14 MG/200ML-% IV SOLN
60.0000 mg/h | INTRAVENOUS | Status: AC
  Administered 2012-08-18 (×2): 60 mg/h via INTRAVENOUS

## 2012-08-18 MED ORDER — AMIODARONE HCL IN DEXTROSE 360-4.14 MG/200ML-% IV SOLN
INTRAVENOUS | Status: AC
  Administered 2012-08-18: 200 mL
  Filled 2012-08-18: qty 200

## 2012-08-18 MED ORDER — TRAMADOL HCL 50 MG PO TABS
50.0000 mg | ORAL_TABLET | Freq: Four times a day (QID) | ORAL | Status: DC | PRN
  Administered 2012-08-18 – 2012-08-25 (×11): 50 mg via ORAL
  Filled 2012-08-18 (×11): qty 1

## 2012-08-18 MED ORDER — POTASSIUM CHLORIDE 10 MEQ/50ML IV SOLN
10.0000 meq | INTRAVENOUS | Status: AC
  Administered 2012-08-18 (×3): 10 meq via INTRAVENOUS

## 2012-08-18 MED ORDER — FUROSEMIDE 10 MG/ML IJ SOLN
40.0000 mg | Freq: Once | INTRAMUSCULAR | Status: AC
  Administered 2012-08-18: 40 mg via INTRAVENOUS
  Filled 2012-08-18: qty 4

## 2012-08-18 MED ORDER — AMIODARONE LOAD VIA INFUSION
150.0000 mg | Freq: Once | INTRAVENOUS | Status: AC
  Administered 2012-08-18: 150 mg via INTRAVENOUS
  Filled 2012-08-18: qty 83.34

## 2012-08-18 MED ORDER — AMIODARONE HCL IN DEXTROSE 360-4.14 MG/200ML-% IV SOLN
30.0000 mg/h | INTRAVENOUS | Status: AC
  Administered 2012-08-19 – 2012-08-21 (×4): 30 mg/h via INTRAVENOUS
  Filled 2012-08-18 (×14): qty 200

## 2012-08-18 MED ORDER — LORAZEPAM 2 MG/ML IJ SOLN
2.0000 mg | INTRAMUSCULAR | Status: DC | PRN
  Administered 2012-08-18 – 2012-08-19 (×7): 2 mg via INTRAVENOUS
  Filled 2012-08-18 (×6): qty 1

## 2012-08-18 MED ORDER — LORAZEPAM 2 MG/ML IJ SOLN
INTRAMUSCULAR | Status: AC
  Administered 2012-08-18: 2 mg via INTRAVENOUS
  Filled 2012-08-18: qty 1

## 2012-08-18 NOTE — Progress Notes (Signed)
SICU p.m. Rounds  Clear better controlled today, walked 1 laparotomy unit Urine output low by mouth intake low we'll supplement with IV fluids Will reduce IV sedation tomorrow as clear but appears to be slightly improved  Wt Readings from Last 3 Encounters:  08/18/12 218 lb 7.6 oz (99.1 kg)  08/18/12 218 lb 7.6 oz (99.1 kg)  08/18/12 218 lb 7.6 oz (99.1 kg)   Temp Readings from Last 3 Encounters:  08/18/12 97.9 F (36.6 C) Oral  08/18/12 97.9 F (36.6 C) Oral  08/18/12 97.9 F (36.6 C) Oral   BP Readings from Last 3 Encounters:  08/18/12 133/80  08/18/12 133/80  08/18/12 133/80   Pulse Readings from Last 3 Encounters:  08/18/12 75  08/16/12 56  08/18/12 75

## 2012-08-18 NOTE — Accreditation Note (Signed)
T. CTS progress note  Status post CABG for multivessel CAD Postoperative problems include atrial fibrillation on amiodarone and delirium requiring Haldol-Ativan.  Hemodynamically stable, chest x-ray clear, no focal neurologic deficits. Blood sugars under good control, oxygenation maintaining sats greater than 95% on nasal cannula  Wt Readings from Last 3 Encounters:  08/18/12 218 lb 7.6 oz (99.1 kg)  08/18/12 218 lb 7.6 oz (99.1 kg)  08/18/12 218 lb 7.6 oz (99.1 kg)   Temp Readings from Last 3 Encounters:  08/18/12 98.1 F (36.7 C) Oral  08/18/12 98.1 F (36.7 C) Oral  08/18/12 98.1 F (36.7 C) Oral   BP Readings from Last 3 Encounters:  08/18/12 107/63  08/18/12 107/63  08/18/12 107/63   Pulse Readings from Last 3 Encounters:  08/18/12 85  08/16/12 56  08/18/12 85   Lab Results  Component Value Date   WBC 14.1* 08/18/2012   HGB 12.5* 08/18/2012   HCT 37.4* 08/18/2012   MCV 91.2 08/18/2012   PLT 146* 08/18/2012    Lab Results  Component Value Date   CREATININE 1.04 08/18/2012   BUN 22 08/18/2012   NA 141 08/18/2012   K 3.7 08/18/2012   CL 104 08/18/2012   CO2 29 08/18/2012    Continue to manage delirium. Patient may need restraints. Patient tried to bite off hand mitten and then broke cap from tooth which was reported to the wife.

## 2012-08-19 ENCOUNTER — Inpatient Hospital Stay (HOSPITAL_COMMUNITY): Payer: Medicare Other

## 2012-08-19 LAB — CBC
HCT: 33.8 % — ABNORMAL LOW (ref 39.0–52.0)
Hemoglobin: 11.1 g/dL — ABNORMAL LOW (ref 13.0–17.0)
MCH: 30.2 pg (ref 26.0–34.0)
MCHC: 32.8 g/dL (ref 30.0–36.0)
MCV: 91.8 fL (ref 78.0–100.0)
Platelets: 136 10*3/uL — ABNORMAL LOW (ref 150–400)
RBC: 3.68 MIL/uL — ABNORMAL LOW (ref 4.22–5.81)
RDW: 13.6 % (ref 11.5–15.5)
WBC: 10.4 10*3/uL (ref 4.0–10.5)

## 2012-08-19 LAB — COMPREHENSIVE METABOLIC PANEL
ALT: 17 U/L (ref 0–53)
AST: 29 U/L (ref 0–37)
Albumin: 2.8 g/dL — ABNORMAL LOW (ref 3.5–5.2)
Alkaline Phosphatase: 55 U/L (ref 39–117)
BUN: 29 mg/dL — ABNORMAL HIGH (ref 6–23)
CO2: 29 mEq/L (ref 19–32)
Calcium: 9.5 mg/dL (ref 8.4–10.5)
Chloride: 104 mEq/L (ref 96–112)
Creatinine, Ser: 1.06 mg/dL (ref 0.50–1.35)
GFR calc Af Amer: 81 mL/min — ABNORMAL LOW (ref >90)
GFR calc non Af Amer: 70 mL/min — ABNORMAL LOW (ref >90)
Glucose, Bld: 111 mg/dL — ABNORMAL HIGH (ref 70–99)
Potassium: 3.9 mEq/L (ref 3.5–5.1)
Sodium: 139 mEq/L (ref 135–145)
Total Bilirubin: 1 mg/dL (ref 0.3–1.2)
Total Protein: 5.3 g/dL — ABNORMAL LOW (ref 6.0–8.3)

## 2012-08-19 LAB — GLUCOSE, CAPILLARY
Glucose-Capillary: 103 mg/dL — ABNORMAL HIGH (ref 70–99)
Glucose-Capillary: 104 mg/dL — ABNORMAL HIGH (ref 70–99)
Glucose-Capillary: 105 mg/dL — ABNORMAL HIGH (ref 70–99)
Glucose-Capillary: 120 mg/dL — ABNORMAL HIGH (ref 70–99)

## 2012-08-19 MED ORDER — FUROSEMIDE 10 MG/ML IJ SOLN
40.0000 mg | Freq: Once | INTRAMUSCULAR | Status: AC
  Administered 2012-08-19: 40 mg via INTRAVENOUS
  Filled 2012-08-19: qty 4

## 2012-08-19 MED ORDER — LORAZEPAM 2 MG/ML IJ SOLN
1.0000 mg | INTRAMUSCULAR | Status: DC | PRN
  Administered 2012-08-19 – 2012-08-21 (×5): 1 mg via INTRAVENOUS
  Filled 2012-08-19 (×5): qty 1

## 2012-08-19 MED ORDER — HALOPERIDOL LACTATE 5 MG/ML IJ SOLN
2.0000 mg | Freq: Four times a day (QID) | INTRAMUSCULAR | Status: DC | PRN
  Administered 2012-08-20 – 2012-08-21 (×4): 2 mg via INTRAVENOUS
  Filled 2012-08-19 (×2): qty 1
  Filled 2012-08-19: qty 0.4
  Filled 2012-08-19 (×2): qty 1

## 2012-08-19 NOTE — Progress Notes (Addendum)
[address]            [place] [address]          [phone]     3 Days Post-Op Procedure(s) (LRB): CORONARY ARTERY BYPASS GRAFTING (CABG) (N/A)  Subjective: Confused, agitated overnight, got OOB and tried to pull out central line. Has been receiving Haldol/Ativan.  Currently drowsy but arousable, oriented.   Objective: Vital signs in last 24 hours: Patient Vitals for the past 24 hrs:  BP Temp Temp src Pulse Resp SpO2 Weight  08/19/12 1157 - 98.5 F (36.9 C) Oral - - - -  08/19/12 0900 136/85 mmHg - - 90  17  96 % -  08/19/12 0812 - 98.3 F (36.8 C) Oral - - - -  08/19/12 0800 150/86 mmHg - - 89  18  96 % -  08/19/12 0700 132/82 mmHg - - 83  15  96 % -  08/19/12 0600 149/81 mmHg - - 97  17  95 % 218 lb 7.6 oz (99.1 kg)  08/19/12 0500 149/113 mmHg - - 104  25  93 % -  08/19/12 0400 147/92 mmHg 97.9 F (36.6 C) Oral 84  15  96 % -  08/19/12 0300 132/74 mmHg - - 85  15  93 % -  08/19/12 0200 124/79 mmHg - - 82  14  95 % -  08/19/12 0100 122/76 mmHg - - 81  15  95 % -  08/19/12 0000 117/66 mmHg 98.2 F (36.8 C) Oral 86  18  93 % -  08/18/12 2300 111/66 mmHg - - 74  14  92 % -  08/18/12 2200 118/70 mmHg - - 79  13  92 % -  08/18/12 2100 118/74 mmHg - - 80  12  94 % -  08/18/12 2000 109/69 mmHg - - 71  12  91 % -  08/18/12 1900 111/67 mmHg - - 69  12  93 % -  08/18/12 1800 133/80 mmHg - - 75  14  96 % -  08/18/12 1700 105/66 mmHg - - 68  13  94 % -  08/18/12 1600 92/54 mmHg - - 67  14  94 % -  08/18/12 1555 - 97.9 F (36.6 C) Oral - - - -  08/18/12 1500 113/67 mmHg - - 74  13  94 % -  08/18/12 1400 122/65 mmHg - - 91  19  95 % -  08/18/12 1300 102/51 mmHg - - 89  18  96 % -   Current Weight  08/19/12 218 lb 7.6 oz (99.1 kg)   PRE OP WEIGHT: 93 kg   Intake/Output from previous day: 09/07 0701 - 09/08 0700 In: 1722.2 [P.O.:240; I.V.:1482.2] Out: 1115 [Urine:1115]  CBGs 100-103-105-111-120-114   PHYSICAL EXAM:  Heart:  RRR Lungs: few exp wheezes bilaterally Wound: Clean and dry Extremities: Mild LE edema Neuro: moves all extremities, responds appropriately to questions, but is somewhat sedated after meds   Lab Results: CBC: Basename 08/19/12 0401 08/18/12 0414  WBC 10.4 14.1*  HGB 11.1* 12.5*  HCT 33.8* 37.4*  PLT 136* 146*   BMET:  Basename 08/19/12 0401 08/18/12 0414  NA 139 141  K 3.9 3.7  CL 104 104  CO2 29 29  GLUCOSE 111* 105*  BUN 29* 22  CREATININE 1.06 1.04  CALCIUM 9.5 10.1    PT/INR:  Basename 08/16/12 1330  LABPROT 18.8*  INR 1.54*    CXR: IMPRESSION:  1. Stable right IJ introducer sheath. No other support apparatus.  2. Moderate left pleural effusion. Small right pleural effusion.  Stable or decreased vascular congestion.    Assessment/Plan: S/P Procedure(s) (LRB): CORONARY ARTERY BYPASS GRAFTING (CABG) (N/A)  CV- AF, now maintaining SR.  Continue Amio, Lopressor.  Could probably change Amio to po soon.  Neuro- postop delirium, stable. Continue Haldol/Ativan. Use sitters as able.  Vol overload- UOP marginal at present.  Will give IV Lasix again today.  DM- sugars stable on Lantus.  Resume po meds when eating better.  Mobilize as tolerated.  Continue Foley for UOP measurement.    LOS: 6 days    COLLINS,GINA H 08/19/2012   patient examined and medical record reviewed,agree with above note. Delirium improved will back off on Ativan and Haldol Chest x-ray shows moderate left perfusion, continue daily IV Lasix, followup chest x-ray in a.m., encourage incentive spirometry Conversion to sinus rhythm, we'll continue IV amiodarone overnight Remove Foley catheter due to irritation 2 patient cause of agitation, trying to pull out VAN TRIGT III,Madalaine Portier 08/19/2012

## 2012-08-20 ENCOUNTER — Inpatient Hospital Stay (HOSPITAL_COMMUNITY): Payer: Medicare Other

## 2012-08-20 LAB — BASIC METABOLIC PANEL
BUN: 25 mg/dL — ABNORMAL HIGH (ref 6–23)
CO2: 32 mEq/L (ref 19–32)
Chloride: 100 mEq/L (ref 96–112)
GFR calc non Af Amer: 84 mL/min — ABNORMAL LOW (ref >90)
Glucose, Bld: 76 mg/dL (ref 70–99)
Potassium: 3.3 mEq/L — ABNORMAL LOW (ref 3.5–5.1)
Sodium: 137 mEq/L (ref 135–145)

## 2012-08-20 LAB — CBC
HCT: 32.9 % — ABNORMAL LOW (ref 39.0–52.0)
Hemoglobin: 10.9 g/dL — ABNORMAL LOW (ref 13.0–17.0)
MCH: 30 pg (ref 26.0–34.0)
MCHC: 33.1 g/dL (ref 30.0–36.0)
MCV: 90.6 fL (ref 78.0–100.0)
RBC: 3.63 MIL/uL — ABNORMAL LOW (ref 4.22–5.81)

## 2012-08-20 LAB — GLUCOSE, CAPILLARY
Glucose-Capillary: 68 mg/dL — ABNORMAL LOW (ref 70–99)
Glucose-Capillary: 72 mg/dL (ref 70–99)
Glucose-Capillary: 82 mg/dL (ref 70–99)

## 2012-08-20 MED ORDER — METOPROLOL TARTRATE 25 MG/10 ML ORAL SUSPENSION
25.0000 mg | Freq: Two times a day (BID) | ORAL | Status: DC
  Administered 2012-08-22: 25 mg
  Filled 2012-08-20 (×12): qty 10

## 2012-08-20 MED ORDER — POTASSIUM CHLORIDE 10 MEQ/50ML IV SOLN
10.0000 meq | INTRAVENOUS | Status: AC
Start: 2012-08-20 — End: 2012-08-20
  Administered 2012-08-20 (×3): 10 meq via INTRAVENOUS

## 2012-08-20 MED ORDER — POTASSIUM CHLORIDE 10 MEQ/50ML IV SOLN
10.0000 meq | INTRAVENOUS | Status: AC
  Administered 2012-08-20 (×2): 10 meq via INTRAVENOUS
  Filled 2012-08-20: qty 50

## 2012-08-20 MED ORDER — INSULIN ASPART 100 UNIT/ML ~~LOC~~ SOLN
0.0000 [IU] | Freq: Three times a day (TID) | SUBCUTANEOUS | Status: DC
  Administered 2012-08-20 – 2012-08-21 (×2): 3 [IU] via SUBCUTANEOUS
  Administered 2012-08-22 – 2012-08-23 (×2): 2 [IU] via SUBCUTANEOUS
  Administered 2012-08-23: 3 [IU] via SUBCUTANEOUS
  Administered 2012-08-24 – 2012-08-25 (×2): 2 [IU] via SUBCUTANEOUS

## 2012-08-20 MED ORDER — METOPROLOL TARTRATE 25 MG PO TABS
25.0000 mg | ORAL_TABLET | Freq: Two times a day (BID) | ORAL | Status: DC
  Administered 2012-08-20 – 2012-08-24 (×9): 25 mg via ORAL
  Filled 2012-08-20 (×12): qty 1

## 2012-08-20 MED ORDER — FUROSEMIDE 10 MG/ML IJ SOLN
40.0000 mg | Freq: Two times a day (BID) | INTRAMUSCULAR | Status: AC
  Administered 2012-08-20 (×2): 40 mg via INTRAVENOUS
  Filled 2012-08-20 (×2): qty 4

## 2012-08-20 NOTE — Progress Notes (Signed)
Patient ID: [name], male   DOB: [birth year], 66 y.o.   MRN: [id number]  Hemodynamically stable  Sinus rhythm on amio drip  Incontinent today but has condom cath on now.  Had BM

## 2012-08-20 NOTE — Progress Notes (Signed)
Pt agitated and trying to get OOB to use the bathroom.  Pt placed on bedpan with sitter in room.  Upon returning back to room pacing wires were found on the floor.  Pacing wire ends clean, exit site clean and dry.  Pt kept on bedrest and MD made aware.  No new orders received, will continue to monitor.

## 2012-08-20 NOTE — Progress Notes (Signed)
4 Days Post-Op Procedure(s) (LRB): CORONARY ARTERY BYPASS GRAFTING (CABG) (N/A) Subjective: Still confused Oriented to person, not place or time Denies pain and nausea  Objective: Vital signs in last 24 hours: Temp:  [97.9 F (36.6 C)-99.4 F (37.4 C)] 98 F (36.7 C) (09/09 0400) Pulse Rate:  [72-113] 90  (09/09 0700) Cardiac Rhythm:  [-] Normal sinus rhythm (09/09 0700) Resp:  [14-27] 25  (09/09 0700) BP: (119-171)/(68-116) 163/96 mmHg (09/09 0700) SpO2:  [92 %-100 %] 96 % (09/09 0700) Weight:  [220 lb 14.4 oz (100.2 kg)] 220 lb 14.4 oz (100.2 kg) (09/09 0600)  Hemodynamic parameters for last 24 hours:    Intake/Output from previous day: 09/08 0701 - 09/09 0700 In: 2599.1 [P.O.:960; I.V.:1539.1; IV Piggyback:100] Out: 846 [Urine:846] Intake/Output this shift:    General appearance: alert and distracted Neurologic: no focal deficits Heart: regular rate and rhythm Lungs: diminished breath sounds left > right Abdomen: mildly distended nontender, + BS Extremities: no edema Wound clean, dry and intact  Lab Results:  Basename 08/20/12 0350 08/19/12 0401  WBC 10.0 10.4  HGB 10.9* 11.1*  HCT 32.9* 33.8*  PLT 166 136*   BMET:  Basename 08/20/12 0350 08/19/12 0401  NA 137 139  K 3.3* 3.9  CL 100 104  CO2 32 29  GLUCOSE 76 111*  BUN 25* 29*  CREATININE 0.95 1.06  CALCIUM 9.5 9.5    PT/INR: No results found for this basename: LABPROT,INR in the last 72 hours ABG    Component Value Date/Time   PHART 7.315* 08/17/2012 0031   HCO3 24.0 08/17/2012 0031   TCO2 25 08/17/2012 1823   ACIDBASEDEF 2.0 08/17/2012 0031   O2SAT 93.0 08/17/2012 0031   CBG (last 3)   Basename 08/20/12 0402 08/20/12 0021 08/19/12 2001  GLUCAP 72 68* 144*    Assessment/Plan: S/P Procedure(s) (LRB): CORONARY ARTERY BYPASS GRAFTING (CABG) (N/A) POD # 4 CABG Complicated by gastric distention/ ileus, confusion and atrial fibrillation Suspicious for a SIRS type reaction  NEURO: delirium/  confusion- possibly in the setting of early dementia, still requiring restraints for personal safety, Haldol and ativan PRN  CV: A fib converted to SR with amiodarone, hypertension- will increase lopressor  RESP- left pleural effusion, sats OK and no respiratory distress, will see how it responds to diuresis  RENAL- volume overload- diurese, hypokalemia- supplement  ENDO- CBG well controlled, change to AC/HS  Thrombocytopenia- resolved  Lovenox and PAS for dvt prophylaxis   LOS: 7 days    HENDRICKSON,STEVEN C 08/20/2012

## 2012-08-21 LAB — GLUCOSE, CAPILLARY
Glucose-Capillary: 114 mg/dL — ABNORMAL HIGH (ref 70–99)
Glucose-Capillary: 176 mg/dL — ABNORMAL HIGH (ref 70–99)

## 2012-08-21 MED ORDER — INSULIN GLARGINE 100 UNIT/ML ~~LOC~~ SOLN
20.0000 [IU] | Freq: Every day | SUBCUTANEOUS | Status: DC
  Administered 2012-08-21 – 2012-08-24 (×4): 20 [IU] via SUBCUTANEOUS

## 2012-08-21 MED ORDER — FUROSEMIDE 40 MG PO TABS
40.0000 mg | ORAL_TABLET | Freq: Two times a day (BID) | ORAL | Status: DC
  Administered 2012-08-21 – 2012-08-23 (×5): 40 mg via ORAL
  Filled 2012-08-21 (×9): qty 1

## 2012-08-21 MED ORDER — GLUCERNA SHAKE PO LIQD
237.0000 mL | Freq: Three times a day (TID) | ORAL | Status: DC
  Administered 2012-08-21 – 2012-08-26 (×7): 237 mL via ORAL

## 2012-08-21 MED ORDER — POTASSIUM CHLORIDE CRYS ER 20 MEQ PO TBCR
EXTENDED_RELEASE_TABLET | ORAL | Status: AC
  Filled 2012-08-21: qty 1

## 2012-08-21 MED ORDER — LORAZEPAM 2 MG/ML IJ SOLN
0.5000 mg | INTRAMUSCULAR | Status: DC | PRN
  Administered 2012-08-22: 0.5 mg via INTRAVENOUS
  Filled 2012-08-21: qty 1

## 2012-08-21 MED ORDER — AMIODARONE HCL 200 MG PO TABS
400.0000 mg | ORAL_TABLET | Freq: Two times a day (BID) | ORAL | Status: DC
  Administered 2012-08-21 – 2012-08-22 (×4): 400 mg via ORAL
  Filled 2012-08-21 (×6): qty 2

## 2012-08-21 MED ORDER — INSULIN ASPART 100 UNIT/ML ~~LOC~~ SOLN
3.0000 [IU] | Freq: Three times a day (TID) | SUBCUTANEOUS | Status: DC
  Administered 2012-08-21 – 2012-08-24 (×6): 3 [IU] via SUBCUTANEOUS

## 2012-08-21 MED ORDER — POTASSIUM CHLORIDE CRYS ER 20 MEQ PO TBCR
20.0000 meq | EXTENDED_RELEASE_TABLET | Freq: Two times a day (BID) | ORAL | Status: DC
  Administered 2012-08-21 (×2): 20 meq via ORAL
  Filled 2012-08-21 (×3): qty 1

## 2012-08-21 NOTE — Progress Notes (Signed)
5 Days Post-Op Procedure(s) (LRB): CORONARY ARTERY BYPASS GRAFTING (CABG) (N/A) Subjective: Wants to go home More alert, appropriate responses to questions but still pulls at lines, tries to get up etc.   Objective: Vital signs in last 24 hours: Temp:  [97.5 F (36.4 C)-98.2 F (36.8 C)] 97.5 F (36.4 C) (09/10 0744) Pulse Rate:  [52-108] 76  (09/10 0800) Cardiac Rhythm:  [-] Normal sinus rhythm (09/10 0800) Resp:  [14-26] 19  (09/10 0800) BP: (111-167)/(63-100) 130/70 mmHg (09/10 0800) SpO2:  [88 %-100 %] 99 % (09/10 0800) Weight:  [222 lb 3.6 oz (100.8 kg)] 222 lb 3.6 oz (100.8 kg) (09/10 0500)  Hemodynamic parameters for last 24 hours:    Intake/Output from previous day: 09/09 0701 - 09/10 0700 In: 2298.8 [P.O.:780; I.V.:1360.8; IV Piggyback:158] Out: 450 [Urine:450] Intake/Output this shift: Total I/O In: 56.7 [I.V.:56.7] Out: -   General appearance: alert and no distress Neurologic: no focal deficits, oriented, but easily distracted Heart: regular rate and rhythm Lungs: diminished breath sounds bibasilar Wound: clean and dry  Lab Results:  Basename 08/20/12 0350 08/19/12 0401  WBC 10.0 10.4  HGB 10.9* 11.1*  HCT 32.9* 33.8*  PLT 166 136*   BMET:  Basename 08/20/12 0350 08/19/12 0401  NA 137 139  K 3.3* 3.9  CL 100 104  CO2 32 29  GLUCOSE 76 111*  BUN 25* 29*  CREATININE 0.95 1.06  CALCIUM 9.5 9.5    PT/INR: No results found for this basename: LABPROT,INR in the last 72 hours ABG    Component Value Date/Time   PHART 7.315* 08/17/2012 0031   HCO3 24.0 08/17/2012 0031   TCO2 25 08/17/2012 1823   ACIDBASEDEF 2.0 08/17/2012 0031   O2SAT 93.0 08/17/2012 0031   CBG (last 3)   Basename 08/21/12 0741 08/20/12 1759 08/20/12 1736  GLUCAP 114* 79 68*    Assessment/Plan: S/P Procedure(s) (LRB): CORONARY ARTERY BYPASS GRAFTING (CABG) (N/A) POD # 5 CABG CV- stable, maintaining SR, change amio to PO  NEURO- delirium, improving. Overall much better this AM,  but still concerned about safety as he will pull at IVs, etc.  DECONDITIONING- PT consult  RESP- pulmonary toilet  RENAL- still volume overloaded, continue diuresis, stop IVF  ENDO- CBG still labile, adjust insulin  Lovenox for DVT prophylaxis    LOS: 8 days    Kayleanna Lorman C 08/21/2012

## 2012-08-21 NOTE — Progress Notes (Signed)
INITIAL ADULT NUTRITION ASSESSMENT Date: 08/21/2012   Time: 10:34 AM  INTERVENTION:  Recommend Carbohydrate Modified High Calorie diet  Glucerna Shake 3 times daily between meals (220 kcals, 9.9 gm protein per 8 fl oz can) RD to follow for nutrition care plan  Reason for Assessment: Malnutrition Screening Tool Report  ASSESSMENT: Male 66 y.o.  Dx: NSTEMI (non-ST elevated myocardial infarction)  Hx:  Past Medical History  Diagnosis Date  . CAD (coronary artery disease)   . HTN (hypertension)   . HLD (hyperlipidemia)   . GERD (gastroesophageal reflux disease)   . Hiatal hernia   . Prostate cancer     Status post radiation treatment for  . Tobacco abuse   . Myocardial infarction   . Diabetes mellitus   . Peripheral vascular disease   . Anginal pain     Related Meds:     . acetaminophen  1,000 mg Oral Q6H   Or  . acetaminophen (TYLENOL) oral liquid 160 mg/5 mL  975 mg Per Tube Q6H  . amiodarone  400 mg Oral BID  . aspirin EC  325 mg Oral Daily   Or  . aspirin  324 mg Per Tube Daily  . atorvastatin  80 mg Oral QHS  . bisacodyl  10 mg Oral Daily   Or  . bisacodyl  10 mg Rectal Daily  . docusate sodium  200 mg Oral Daily  . enoxaparin (LOVENOX) injection  40 mg Subcutaneous Q24H  . furosemide  40 mg Intravenous BID  . furosemide  40 mg Oral BID  . insulin aspart  0-15 Units Subcutaneous TID WC  . insulin aspart  3 Units Subcutaneous TID WC  . insulin glargine  20 Units Subcutaneous Daily  . metoprolol tartrate  25 mg Oral BID   Or  . metoprolol tartrate  25 mg Per Tube BID  . pantoprazole  40 mg Oral Q1200  . potassium chloride  10 mEq Intravenous Q1 Hr x 2  . potassium chloride SA      . potassium chloride  20 mEq Oral BID  . sodium chloride  3 mL Intravenous Q12H  . Tamsulosin HCl  0.4 mg Oral QPC supper  . DISCONTD: insulin glargine  25 Units Subcutaneous Daily    Ht: 5\' 9"  (175.3 cm)  Wt: 222 lb 3.6 oz (100.8 kg)  Ideal Wt: 72.7 kg % Ideal Wt:  137%  Usual Wt: --- % Usual Wt: ---  Body mass index is 32.82 kg/(m^2).  Food/Nutrition Related Hx: recent weight lost without trying per admission nutrition screen  Labs:  CMP     Component Value Date/Time   NA 137 08/20/2012 0350   K 3.3* 08/20/2012 0350   CL 100 08/20/2012 0350   CO2 32 08/20/2012 0350   GLUCOSE 76 08/20/2012 0350   BUN 25* 08/20/2012 0350   CREATININE 0.95 08/20/2012 0350   CALCIUM 9.5 08/20/2012 0350   PROT 5.3* 08/19/2012 0401   ALBUMIN 2.8* 08/19/2012 0401   AST 29 08/19/2012 0401   ALT 17 08/19/2012 0401   ALKPHOS 55 08/19/2012 0401   BILITOT 1.0 08/19/2012 0401   GFRNONAA 84* 08/20/2012 0350   GFRAA >90 08/20/2012 0350     Intake/Output Summary (Last 24 hours) at 08/21/12 1036 Last data filed at 08/21/12 0900  Gross per 24 hour  Intake 1838.1 ml  Output    400 ml  Net 1438.1 ml    CBG (last 3)   Basename 08/21/12 0741 08/20/12 1759 08/20/12 1736  GLUCAP 114* 79 68*    Lab Results  Component Value Date   HGBA1C 7.6* 08/13/2012    Diet Order: Carb Control  Supplements/Tube Feeding: N/A  IVF:    sodium chloride Last Rate: 20 mL/hr (08/16/12 1443)  sodium chloride Last Rate: 250 mL (08/17/12 0600)  amiodarone (NEXTERONE PREMIX) 360 mg/200 mL dextrose Last Rate: 30 mg/hr (08/21/12 0520)  DISCONTD: sodium chloride Last Rate: 40 mL/hr (08/19/12 1729)    Estimated Nutritional Needs:   Kcal: 2100-2300 Protein: 110-120 gm Fluid: 2.2-2.4 L  Patient transferred from [facility] after complaint of recurrent angina pectoris over past few days; s/p CABG 9/6; spoke with patient and patient's wife; he reports he lost weight years ago, however, has been gaining the weight back; was eating well piror to hospitalization; at this time reports a decreased appetite; per Nurse Tech consumed ~ 25% of breakfast this AM; he is amenable to Glucerna Shake supplements -- RD to order.  NUTRITION DIAGNOSIS: -Inadequate oral intake (NI-2.1).  Status: Ongoing  RELATED TO:  decreased appetite  AS EVIDENCE BY: PO intake 25-50%  MONITORING/EVALUATION(Goals): Goal: Oral intake with meals & supplements to meet >/= 90% of estimated nutrition needs Monitor: PO & supplemental intake, weight, labs, I/O's  EDUCATION NEEDS: -No education needs identified at this time  Dietitian #: [phone]  DOCUMENTATION CODES Per approved criteria  -Obesity Unspecified    Alger Memos 08/21/2012, 10:34 AM

## 2012-08-21 NOTE — Clinical Documentation Improvement (Signed)
GENERIC DOCUMENTATION CLARIFICATION QUERY  THIS DOCUMENT IS NOT A PERMANENT PART OF THE MEDICAL RECORD  TO RESPOND TO THE THIS QUERY, FOLLOW THE INSTRUCTIONS BELOW:  1. If needed, update documentation for the patient's encounter via the notes activity.  2. Access this query again and click edit on the In [employer].  3. After updating, or not, click F2 to complete all highlighted (required) fields concerning your review. Select "additional documentation in the medical record" OR "no additional documentation provided".  4. Click Sign note button.  5. The deficiency will fall out of your In Basket *Please let us know if you are not able to complete this workflow by phone or e-mail (listed below).  Please update your documentation within the medical record to reflect your response to this query.                                                                                        08/21/12   Dear [facility],  In a better effort to capture your patient's severity of illness, reflect appropriate length of stay and utilization of resources, a review of the patient medical record has revealed the following indicators. Based on your clinical judgment, please clarify and document in a progress note and/or discharge summary the clinical condition associated with the following supporting information: In responding to this query please exercise your independent judgment.  The fact that a query is asked, does not imply that any particular answer is desired or expected.  Possible Clinical Conditions?  Acute Pulmonary Edema  Chronic Pulmonary Edema   Other Condition  Cannot Clinically Determine   Supporting Information:  Risk Factors: CAD, s/p CABG  Signs & Symptoms:Central mild vascular congestion and mild interstitial prominence bilaterally probable minimal interstitial edema. Persistent moderate sized left pleural effusion with left basilar atelectasis or infiltrate. Probable small  right pleural effusion with right basilar atelectasis  Treatment: furosemide (LASIX) injection 40 mg  40 mg, Intravenous, 2 times daily, First dose on Mon 08/20/12 at 0800, For 2 doses  furosemide (LASIX) tablet 40 mg  40 mg, Oral, 2 times daily, First dose on Tue 08/21/12 at 1000  You may use possible, probable, or suspect with inpatient documentation. possible, probable, suspected diagnoses MUST be documented at the time of discharge  Reviewed:  no additional documentation provided  Thank You,  Amada Kingfisher RN, [name], CCM   Clinical Documentation Specialist: [phone] [email or web address]   Health Information Management [place]

## 2012-08-21 NOTE — Progress Notes (Signed)
Patient ID: [name], male   DOB: February 13, 1946, 66 y.o.   MRN: [id number]                   [address]            [place] [address]          [phone]     5 Days Post-Op Procedure(s) (LRB): CORONARY ARTERY BYPASS GRAFTING (CABG) (N/A)  Total Length of Stay:  LOS: 8 days  BP 146/78  Pulse 77  Temp 97.7 F (36.5 C) (Oral)  Resp 20  Ht 5\' 9"  (1.753 m)  Wt 222 lb 3.6 oz (100.8 kg)  BMI 32.82 kg/m2  SpO2 96%     . sodium chloride 20 mL/hr (08/16/12 1443)  . sodium chloride 250 mL (08/17/12 0600)  . amiodarone (NEXTERONE PREMIX) 360 mg/200 mL dextrose 30 mg/hr (08/21/12 0520)  . DISCONTD: sodium chloride 40 mL/hr (08/19/12 1729)     Lab Results  Component Value Date   WBC 10.0 08/20/2012   HGB 10.9* 08/20/2012   HCT 32.9* 08/20/2012   PLT 166 08/20/2012   GLUCOSE 76 08/20/2012   CHOL 141 08/14/2012   TRIG 151* 08/14/2012   HDL 33* 08/14/2012   LDLCALC 78 08/14/2012   ALT 17 08/19/2012   AST 29 08/19/2012   NA 137 08/20/2012   K 3.3* 08/20/2012   CL 100 08/20/2012   CREATININE 0.95 08/20/2012   BUN 25* 08/20/2012   CO2 32 08/20/2012   INR 1.54* 08/16/2012   HGBA1C 7.6* 08/13/2012   Less confused, knows he is at [facility] MD  Beeper [phone] Office [phone] 08/21/2012 5:35 PM

## 2012-08-22 ENCOUNTER — Inpatient Hospital Stay (HOSPITAL_COMMUNITY): Payer: Medicare Other

## 2012-08-22 LAB — BASIC METABOLIC PANEL
BUN: 21 mg/dL (ref 6–23)
CO2: 33 mEq/L — ABNORMAL HIGH (ref 19–32)
Chloride: 98 mEq/L (ref 96–112)
GFR calc Af Amer: 76 mL/min — ABNORMAL LOW (ref >90)
Potassium: 3.5 mEq/L (ref 3.5–5.1)

## 2012-08-22 LAB — CBC
HCT: 38.1 % — ABNORMAL LOW (ref 39.0–52.0)
Hemoglobin: 13.1 g/dL (ref 13.0–17.0)
MCHC: 34.4 g/dL (ref 30.0–36.0)
MCV: 90.5 fL (ref 78.0–100.0)

## 2012-08-22 LAB — GLUCOSE, CAPILLARY: Glucose-Capillary: 138 mg/dL — ABNORMAL HIGH (ref 70–99)

## 2012-08-22 MED ORDER — SODIUM CHLORIDE 0.9 % IJ SOLN: 3.0000 mL | INTRAMUSCULAR | Status: DC | PRN

## 2012-08-22 MED ORDER — GUAIFENESIN-DM 100-10 MG/5ML PO SYRP: 15.0000 mL | ORAL_SOLUTION | ORAL | Status: DC | PRN

## 2012-08-22 MED ORDER — ZOLPIDEM TARTRATE 5 MG PO TABS
5.0000 mg | ORAL_TABLET | Freq: Every evening | ORAL | Status: DC | PRN
  Administered 2012-08-23: 5 mg via ORAL
  Filled 2012-08-22: qty 1

## 2012-08-22 MED ORDER — SODIUM CHLORIDE 0.9 % IV SOLN: 250.0000 mL | INTRAVENOUS | Status: DC | PRN

## 2012-08-22 MED ORDER — ALUM & MAG HYDROXIDE-SIMETH 200-200-20 MG/5ML PO SUSP: 15.0000 mL | ORAL | Status: DC | PRN

## 2012-08-22 MED ORDER — MOVING RIGHT ALONG BOOK
Freq: Once | Status: AC
  Administered 2012-08-23: 17:00:00
  Filled 2012-08-22: qty 1

## 2012-08-22 MED ORDER — METFORMIN HCL 500 MG PO TABS
1000.0000 mg | ORAL_TABLET | Freq: Two times a day (BID) | ORAL | Status: DC
  Administered 2012-08-22 – 2012-08-26 (×7): 1000 mg via ORAL
  Filled 2012-08-22 (×11): qty 2

## 2012-08-22 MED ORDER — POTASSIUM CHLORIDE CRYS ER 20 MEQ PO TBCR
40.0000 meq | EXTENDED_RELEASE_TABLET | Freq: Two times a day (BID) | ORAL | Status: DC
  Administered 2012-08-22 – 2012-08-23 (×3): 40 meq via ORAL
  Filled 2012-08-22 (×4): qty 2

## 2012-08-22 MED ORDER — SODIUM CHLORIDE 0.9 % IJ SOLN
3.0000 mL | Freq: Two times a day (BID) | INTRAMUSCULAR | Status: DC
  Administered 2012-08-22 – 2012-08-25 (×7): 3 mL via INTRAVENOUS

## 2012-08-22 MED ORDER — POTASSIUM CHLORIDE CRYS ER 20 MEQ PO TBCR
20.0000 meq | EXTENDED_RELEASE_TABLET | ORAL | Status: DC | PRN
  Administered 2012-08-22 (×2): 20 meq via ORAL
  Filled 2012-08-22: qty 1

## 2012-08-22 MED ORDER — MAGNESIUM HYDROXIDE 400 MG/5ML PO SUSP
30.0000 mL | Freq: Every day | ORAL | Status: DC | PRN
  Administered 2012-08-23: 30 mL via ORAL
  Filled 2012-08-22: qty 30

## 2012-08-22 NOTE — Procedures (Signed)
Successful US guided left thoracentesis. Yielded of bloody pleural fluid. Pt tolerated procedure well. No immediate complications.  Specimen was not sent for labs. CXR ordered.  Brayton El PA-C 08/22/2012 11:33 AM

## 2012-08-22 NOTE — Progress Notes (Signed)
Patient on and off bedpan and bedside commode multiple times an hour since start of shift due to "urge to stool." Only passing gas. Incontinent of urine in between use of bedpan. Offering urinal frequently; still incontinent. He becomes upset and agitated and he repeatedly asks to be placed on the pan, when he has just finished trying; with no results. Have explained repeatedly that sitting on hard plastic is not good for his skin, and he becomes further agitated. Attempts at times to get OOB and uses arms to attempt to pull himself up. Have educated repeatedly about not using arms to pull himself, to no avail. Patient answers all orientation question appropriately and does not appear confused. Will continue to provide for safety and comfort. Thresa Ross RN

## 2012-08-22 NOTE — Progress Notes (Signed)
6 Days Post-Op Procedure(s) (LRB): CORONARY ARTERY BYPASS GRAFTING (CABG) (N/A) Subjective: Mental status much improved but still some issues with impulse control Expresses understanding of safety precautions, but will still try to get OOB on his own  Objective: Vital signs in last 24 hours: Temp:  [97.7 F (36.5 C)-98.6 F (37 C)] 98 F (36.7 C) (09/11 0734) Pulse Rate:  [64-90] 81  (09/11 0700) Cardiac Rhythm:  [-] Normal sinus rhythm (09/11 0700) Resp:  [15-28] 23  (09/11 0700) BP: (108-161)/(56-95) 149/81 mmHg (09/11 0600) SpO2:  [91 %-100 %] 92 % (09/11 0700) Weight:  [224 lb (101.606 kg)] 224 lb (101.606 kg) (09/11 0500)  Hemodynamic parameters for last 24 hours:    Intake/Output from previous day: 09/10 0701 - 09/11 0700 In: 736.4 [P.O.:620; I.V.:116.4] Out: 212 [Urine:210; Stool:2] Intake/Output this shift:    General appearance: alert and no distress Neurologic: intact Heart: regular rate and rhythm Lungs: diminished breath sounds base - left Abdomen: mildly distended, active BS Wound: clean and dry  Lab Results:  Basename 08/22/12 0420 08/20/12 0350  WBC 11.9* 10.0  HGB 13.1 10.9*  HCT 38.1* 32.9*  PLT 293 166   BMET:  Basename 08/22/12 0420 08/20/12 0350  NA 136 137  K 3.5 3.3*  CL 98 100  CO2 33* 32  GLUCOSE 147* 76  BUN 21 25*  CREATININE 1.12 0.95  CALCIUM 9.9 9.5    PT/INR: No results found for this basename: LABPROT,INR in the last 72 hours ABG    Component Value Date/Time   PHART 7.315* 08/17/2012 0031   HCO3 24.0 08/17/2012 0031   TCO2 25 08/17/2012 1823   ACIDBASEDEF 2.0 08/17/2012 0031   O2SAT 93.0 08/17/2012 0031   CBG (last 3)   Basename 08/22/12 0732 08/21/12 2357 08/21/12 1542  GLUCAP 138* 167* 117*    Assessment/Plan: S/P Procedure(s) (LRB): CORONARY ARTERY BYPASS GRAFTING (CABG) (N/A) Plan for transfer to step-down: see transfer orders Will transfer to PTCU CV- maintaining SR RESP- pulmonary toilet  Left pleural effusion,  will try to US guided thoracentesis, hold lovenox RENAL- creatinine up slightly, but still WNL, supplement K CBG- slightly elevated will increase meal coverage    LOS: 9 days    HENDRICKSON,STEVEN C 08/22/2012

## 2012-08-23 LAB — BASIC METABOLIC PANEL
BUN: 23 mg/dL (ref 6–23)
CO2: 31 mEq/L (ref 19–32)
Calcium: 9.8 mg/dL (ref 8.4–10.5)
Chloride: 99 mEq/L (ref 96–112)
Creatinine, Ser: 1.25 mg/dL (ref 0.50–1.35)
GFR calc Af Amer: 67 mL/min — ABNORMAL LOW (ref >90)

## 2012-08-23 LAB — URINALYSIS, ROUTINE W REFLEX MICROSCOPIC
Bilirubin Urine: NEGATIVE
Glucose, UA: NEGATIVE mg/dL
Hgb urine dipstick: NEGATIVE
Ketones, ur: NEGATIVE mg/dL
Leukocytes, UA: NEGATIVE
Nitrite: NEGATIVE
Protein, ur: 30 mg/dL — AB
Specific Gravity, Urine: 1.013 (ref 1.005–1.030)
Urobilinogen, UA: 1 mg/dL (ref 0.0–1.0)
pH: 5.5 (ref 5.0–8.0)

## 2012-08-23 LAB — CBC
HCT: 38.1 % — ABNORMAL LOW (ref 39.0–52.0)
MCH: 29.5 pg (ref 26.0–34.0)
MCV: 90.7 fL (ref 78.0–100.0)
RDW: 13.4 % (ref 11.5–15.5)
WBC: 11.3 10*3/uL — ABNORMAL HIGH (ref 4.0–10.5)

## 2012-08-23 LAB — GLUCOSE, CAPILLARY
Glucose-Capillary: 167 mg/dL — ABNORMAL HIGH (ref 70–99)
Glucose-Capillary: 100 mg/dL — ABNORMAL HIGH (ref 70–99)

## 2012-08-23 LAB — URINE MICROSCOPIC-ADD ON

## 2012-08-23 MED ORDER — AMIODARONE HCL 200 MG PO TABS
200.0000 mg | ORAL_TABLET | Freq: Two times a day (BID) | ORAL | Status: DC
  Administered 2012-08-23 – 2012-08-26 (×7): 200 mg via ORAL
  Filled 2012-08-23 (×8): qty 1

## 2012-08-23 NOTE — Evaluation (Signed)
Occupational Therapy Evaluation Patient Details Name: [name] MRN: [id number] DOB: 1946/08/20 Today's Date: 08/23/2012 Time: 4098-1191 OT Time Calculation (min): 18 min  OT Assessment / Plan / Recommendation Clinical Impression  Pt is recovering from CABG x 4.  Will need acute OT to address the below deficit areas to decrease burden on wife at home.  Expect pt will be able to d/c home with 24 hour supervision.      OT Assessment  Patient needs continued OT Services    Follow Up Recommendations  Supervision/Assistance - 24 hour    Barriers to Discharge      Equipment Recommendations  Other (comment) (possible RW but wife doesn't want to decide yet.)    Recommendations for Other Services    Frequency  Min 2X/week    Precautions / Restrictions Precautions Precautions: Sternal;Fall   Pertinent Vitals/Pain     ADL  Eating/Feeding: Simulated;Independent Where Assessed - Eating/Feeding: Chair Grooming: Performed;Wash/dry hands;Min guard Where Assessed - Grooming: Unsupported standing Upper Body Bathing: Simulated;Minimal assistance Where Assessed - Upper Body Bathing: Unsupported sitting Lower Body Bathing: Minimal assistance;Performed Where Assessed - Lower Body Bathing: Unsupported standing Upper Body Dressing: Simulated;Supervision/safety Where Assessed - Upper Body Dressing: Unsupported sitting Lower Body Dressing: Performed;Minimal assistance Where Assessed - Lower Body Dressing: Unsupported sitting Toilet Transfer: Performed;Min guard Toilet Transfer Method: Sit to Barista: Regular height toilet Toileting - Clothing Manipulation and Hygiene: Performed;Minimal assistance Where Assessed - Engineer, mining and Hygiene: Sit to stand from 3-in-1 or toilet Transfers/Ambulation Related to ADLs: Min guard assist in room. ADL Comments: Pt instructed in sternal precautions.  Pt distractible and somewhat impulsive. Wife is in good  health and able to assist pt at home.    OT Diagnosis: Generalized weakness;Cognitive deficits  OT Problem List: Decreased activity tolerance;Impaired balance (sitting and/or standing);Decreased cognition;Decreased safety awareness;Decreased knowledge of precautions OT Treatment Interventions: Self-care/ADL training;Cognitive remediation/compensation;Patient/family education;DME and/or AE instruction   OT Goals Acute Rehab OT Goals OT Goal Formulation: With patient Time For Goal Achievement: 09/06/12 Potential to Achieve Goals: Good ADL Goals Pt Will Perform Grooming: with supervision;Standing at sink ADL Goal: Grooming - Progress: Goal set today Pt Will Perform Upper Body Bathing: with supervision;Sitting, edge of bed ADL Goal: Upper Body Bathing - Progress: Goal set today Pt Will Perform Lower Body Bathing: with supervision;Sit to stand from bed ADL Goal: Lower Body Bathing - Progress: Goal set today Pt Will Perform Upper Body Dressing: with supervision;Sitting, bed ADL Goal: Upper Body Dressing - Progress: Goal set today Pt Will Perform Lower Body Dressing: Sit to stand from bed;with supervision ADL Goal: Lower Body Dressing - Progress: Goal set today Pt Will Transfer to Toilet: with supervision;Ambulation;Regular height toilet ADL Goal: Toilet Transfer - Progress: Goal set today Pt Will Perform Toileting - Clothing Manipulation: with supervision;Sitting on 3-in-1 or toilet ADL Goal: Toileting - Clothing Manipulation - Progress: Goal set today Pt Will Perform Toileting - Hygiene: with supervision;Sit to stand from 3-in-1/toilet ADL Goal: Toileting - Hygiene - Progress: Goal set today Pt Will Perform Tub/Shower Transfer: Shower transfer;Ambulation;with supervision ADL Goal: Web designer - Progress: Goal set today Miscellaneous OT Goals Miscellaneous OT Goal #1: Pt and wife will generalize sternal precautions in ADL and mobility. OT Goal: Miscellaneous Goal #1 - Progress: Goal  set today  Visit Information  Last OT Received On: 08/23/12 Assistance Needed: +1    Subjective Data  Subjective: "Do you have anything I could throw up in?" Patient Stated Goal: Home with wife.  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 Dominant Hand: Right      Cognition  Overall Cognitive Status: Impaired Area of Impairment: Memory;Safety/judgement;Awareness of deficits Arousal/Alertness: Awake/alert Behavior During Session: [facility] for tasks performed Memory: Decreased recall of precautions Safety/Judgement: Decreased awareness of safety precautions;Decreased safety judgement for tasks assessed;Decreased awareness of need for assistance;Impulsive    Extremity/Trunk Assessment Right Upper Extremity Assessment RUE ROM/Strength/Tone: [facility] for tasks assessed;Unable to fully assess;Due to precautions Left Upper Extremity Assessment LUE ROM/Strength/Tone: [facility] for tasks assessed;Unable to fully assess;Due to precautions   Mobility  Shoulder Instructions  Bed Mobility Bed Mobility: Not assessed Transfers Transfers: Sit to Stand;Stand to Sit Sit to Stand: 4: Min guard;With upper extremity assist;With armrests;From chair/3-in-1;From toilet Stand to Sit: 4: Min guard;With upper extremity assist;With armrests;To chair/3-in-1;To toilet Details for Transfer Assistance: verbal cues to use arms for balance only       Exercise     Balance Balance Balance Assessed: Yes Static Sitting  Balance Static Sitting - Balance Support: Feet supported Static Sitting - Level of Assistance: 5: Stand by assistance Static Standing Balance Static Standing - Balance Support: No upper extremity supported;During functional activity Static Standing - Level of Assistance: 5: Stand by assistance   End of Session OT - End of Session Activity Tolerance: Patient tolerated treatment well Patient left: in chair;with call bell/phone within reach;with family/visitor present Nurse Communication: Other (comment) (pt with nausea)  GO     Evern Bio 08/23/2012, 12:23 PM [phone]

## 2012-08-23 NOTE — Progress Notes (Addendum)
                    [address]            [place] [address]          [phone]     7 Days Post-Op Procedure(s) (LRB): CORONARY ARTERY BYPASS GRAFTING (CABG) (N/A)  Subjective: Eating breakfast.  Mental status improved.  Refused am Lasix.   Objective: Vital signs in last 24 hours: Patient Vitals for the past 24 hrs:  BP Temp Temp src Pulse Resp SpO2  08/23/12 0636 118/74 mmHg 98.4 F (36.9 C) Oral 66  18  94 %  08/22/12 2124 138/80 mmHg 98.5 F (36.9 C) Oral 76  20  90 %  08/22/12 1204 110/71 mmHg - - 63  20  96 %  08/22/12 1128 92/58 mmHg - - - - -  08/22/12 1100 146/82 mmHg - - - - -  08/22/12 1000 145/94 mmHg - - 78  23  95 %  08/22/12 0900 - - - 74  23  94 %  08/22/12 0800 99/77 mmHg - - 78  25  93 %   Today's weight: 95 kg PRE OP WEIGHT: 93 kg    Intake/Output from previous day: 09/11 0701 - 09/12 0700 In: 320 [P.O.:320] Out: 50 [Urine:50] CBGs 112-119-127-125   PHYSICAL EXAM:  Heart: RRR Lungs: decreased BS in left base Wound: Clean and dry Extremities: trace LE edema    Lab Results: CBC: Basename 08/23/12 0420 08/22/12 0420  WBC 11.3* 11.9*  HGB 12.4* 13.1  HCT 38.1* 38.1*  PLT 305 293   BMET:  Basename 08/23/12 0420 08/22/12 0420  NA 138 136  K 3.8 3.5  CL 99 98  CO2 31 33*  GLUCOSE 127* 147*  BUN 23 21  CREATININE 1.25 1.12  CALCIUM 9.8 9.9    PT/INR: No results found for this basename: LABPROT,INR in the last 72 hours    Assessment/Plan: S/P Procedure(s) (LRB): CORONARY ARTERY BYPASS GRAFTING (CABG) (N/A)  CV- AF, maintaining SR. Continue Amio, Lopressor.  Neuro- postop delirium, improving. Continue to monitor. Vol overload- diurese.  DM- sugars stable on home meds. Pulm- breathing stable after U/S guided thoracentesis.. No CXR done today. Cr up slightly- watch. CRPI/PT.     LOS: 10 days    COLLINS,GINA H 08/23/2012  Patient seen and examined. Agree with above C/o multiple loose stools  overnight- will hold stool softeners/ laxatives No confusion or agitation issues overnight

## 2012-08-23 NOTE — Progress Notes (Signed)
CARDIAC REHAB PHASE I   PRE:  Rate/Rhythm: 58 SB  BP:  Supine:   Sitting: 96/60  Standing:    SaO2: 92 RA  MODE:  Ambulation: 550 ft   POST:  Rate/Rhythem: 68   BP:  Supine:   Sitting: 94/62  Standing:    SaO2: 94 RA 1439-1505 Assisted X 1 and used walker to ambulate. Gait steady with walker. Pt walked 550 feet, tired by end of walk. Pt back to chair after walk with chair alarm on and wife present.  [name]

## 2012-08-23 NOTE — Progress Notes (Signed)
Foley emptied- 1000 cc returned

## 2012-08-23 NOTE — Evaluation (Signed)
Physical Therapy Evaluation Patient Details Name: [name] MRN: [id number] DOB: 1946/05/30 Today's Date: 08/23/2012 Time: 4098-1191 PT Time Calculation (min): 21 min  PT Assessment / Plan / Recommendation Clinical Impression  Pt s/p CABG.  Pt with some confusion and will require 24 supervision.    PT Assessment  Patient needs continued PT services    Follow Up Recommendations  Home health PT    Barriers to Discharge        Equipment Recommendations  Other (comment) (possible RW but wife doesn't want to decide yet.)    Recommendations for Other Services     Frequency Min 3X/week    Precautions / Restrictions Precautions Precautions: Sternal;Fall   Pertinent Vitals/Pain VSS      Mobility  Transfers Transfers: Sit to Stand;Stand to Sit Sit to Stand: 4: Min guard;With upper extremity assist;With armrests;From chair/3-in-1;From toilet Stand to Sit: 4: Min guard;With upper extremity assist;With armrests;To chair/3-in-1;To toilet Details for Transfer Assistance: verbal cues to use arms for balance only Ambulation/Gait Ambulation/Gait Assistance: 4: Min assist Ambulation Distance (Feet): 350 Feet Assistive device: Rolling walker;None (pushing w/c) Ambulation/Gait Assistance Details: verbal cues to stand more erect. Used RW in hall not in room. Gait Pattern: Narrow base of support;Step-through pattern;Decreased stride length General Gait Details: Slightly unsteady which is improved with the walker.    Exercises     PT Diagnosis: Difficulty walking;Generalized weakness  PT Problem List: Decreased strength;Decreased activity tolerance;Decreased balance;Decreased mobility;Decreased safety awareness;Decreased knowledge of use of DME;Decreased cognition PT Treatment Interventions: DME instruction;Gait training;Stair training;Functional mobility training;Therapeutic activities;Therapeutic exercise;Balance training;Patient/family education   PT Goals Acute Rehab PT Goals PT  Goal Formulation: With patient Time For Goal Achievement: 08/30/12 Potential to Achieve Goals: Good Pt will go Supine/Side to Sit: with modified independence PT Goal: Supine/Side to Sit - Progress: Goal set today Pt will go Sit to Supine/Side: with modified independence PT Goal: Sit to Supine/Side - Progress: Goal set today Pt will go Sit to Stand: with supervision PT Goal: Sit to Stand - Progress: Goal set today Pt will go Stand to Sit: with supervision PT Goal: Stand to Sit - Progress: Goal set today Pt will Ambulate: >150 feet;with supervision;with least restrictive assistive device PT Goal: Ambulate - Progress: Goal set today Pt will Go Up / Down Stairs: 1-2 stairs;with min assist PT Goal: Up/Down Stairs - Progress: Goal set today  Visit Information  Last PT Received On: 08/23/12 Assistance Needed: +1    Subjective Data  Subjective: Pt stated he has been having diarrhea. Patient Stated Goal: To take a nap.   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    Cognition  Overall Cognitive Status: Impaired Area of Impairment: Memory;Safety/judgement;Awareness of deficits Arousal/Alertness: Awake/alert Behavior During Session: [facility] for tasks performed Memory: Decreased recall of precautions Safety/Judgement: Decreased awareness of safety precautions;Decreased safety judgement for tasks assessed;Decreased awareness of need for assistance;Impulsive    Extremity/Trunk Assessment Right Lower  Extremity Assessment RLE ROM/Strength/Tone: Deficits RLE ROM/Strength/Tone Deficits: grossly 4/5 Left Lower Extremity Assessment LLE ROM/Strength/Tone: Deficits LLE ROM/Strength/Tone Deficits: grossly 4/5   Balance Static Standing Balance Static  Standing - Balance Support: No upper extremity supported;During functional activity Static Standing - Level of Assistance: 5: Stand by assistance  End of Session PT - End of Session Equipment Utilized During Treatment: Gait belt Activity Tolerance: Patient tolerated treatment well Patient left: with nursing in room;with family/visitor present (left on toilet with sitter and wife present)  GP     [facility] 08/23/2012, 11:23 AM  Skip Mayer PT [phone]

## 2012-08-23 NOTE — Progress Notes (Signed)
#   14 french foley inserted by [facility] NT, without difficulty, pt reports relief, yellow urine returning.

## 2012-08-23 NOTE — Progress Notes (Signed)
Pt complaint of not urinating much today, states urinating "some when he has bowel movements" no measured output so far today. Performed bladder scan  Greater than 999 confirmed with 2nd RN.Dr Maren Beach made aware orders recieved

## 2012-08-24 ENCOUNTER — Telehealth (HOSPITAL_COMMUNITY): Payer: Self-pay | Admitting: *Deleted

## 2012-08-24 LAB — GLUCOSE, CAPILLARY
Glucose-Capillary: 54 mg/dL — ABNORMAL LOW (ref 70–99)
Glucose-Capillary: 59 mg/dL — ABNORMAL LOW (ref 70–99)
Glucose-Capillary: 96 mg/dL (ref 70–99)
Glucose-Capillary: 155 mg/dL — ABNORMAL HIGH (ref 70–99)

## 2012-08-24 MED ORDER — FUROSEMIDE 40 MG PO TABS
40.0000 mg | ORAL_TABLET | Freq: Every day | ORAL | Status: DC
  Administered 2012-08-25: 40 mg via ORAL
  Filled 2012-08-24: qty 1

## 2012-08-24 MED ORDER — WITCH HAZEL-GLYCERIN EX PADS
MEDICATED_PAD | CUTANEOUS | Status: DC | PRN
  Filled 2012-08-24: qty 100

## 2012-08-24 MED ORDER — POTASSIUM CHLORIDE CRYS ER 20 MEQ PO TBCR
40.0000 meq | EXTENDED_RELEASE_TABLET | Freq: Every day | ORAL | Status: DC
  Administered 2012-08-24 – 2012-08-25 (×2): 40 meq via ORAL
  Filled 2012-08-24: qty 2

## 2012-08-24 MED ORDER — HYDROCORTISONE 2.5 % RE CREA
TOPICAL_CREAM | Freq: Three times a day (TID) | RECTAL | Status: DC | PRN
  Filled 2012-08-24: qty 28.35

## 2012-08-24 NOTE — Progress Notes (Signed)
[id number] Cardiac Rehab On arrival pt in chair with emesis basin in front of him c/o of nausea. Told RN of pt's c/o. Pt belching. Completed discharge education  with pt and wife. Pt agrees to Outpt. CRP in [place], will send referral. Pt to bed and getting something for nausea. Encouraged him to walk later when he feels better.

## 2012-08-24 NOTE — Progress Notes (Addendum)
                    [address]            [place] [address]          [phone]     8 Days Post-Op Procedure(s) (LRB): CORONARY ARTERY BYPASS GRAFTING (CABG) (N/A)  Subjective: Feels well this am. Rested better.  No more loose stools.    Objective: Vital signs in last 24 hours: Patient Vitals for the past 24 hrs:  BP Temp Temp src Pulse Resp SpO2 Weight  08/24/12 0558 123/70 mmHg 97.4 F (36.3 C) Oral 63  18  94 % 206 lb (93.441 kg)  08/23/12 2119 107/67 mmHg 98 F (36.7 C) Oral 69  18  92 % -  08/23/12 2025 113/66 mmHg 97.5 F (36.4 C) Oral 65  16  90 % -  08/23/12 1425 96/60 mmHg 98.7 F (37.1 C) Oral 58  18  94 % -   Current Weight  08/24/12 206 lb (93.441 kg)  PRE OP WEIGHT: 93 kg    Intake/Output from previous day: 09/12 0701 - 09/13 0700 In: 720 [P.O.:720] Out: 1600 [Urine:1600]  CBGs 100-95-87    PHYSICAL EXAM:  Heart: RRR Lungs: clear Wound: clean and dry Extremities: mild LE edema    Lab Results: CBC: Basename 08/23/12 0420 08/22/12 0420  WBC 11.3* 11.9*  HGB 12.4* 13.1  HCT 38.1* 38.1*  PLT 305 293   BMET:  Basename 08/23/12 0420 08/22/12 0420  NA 138 136  K 3.8 3.5  CL 99 98  CO2 31 33*  GLUCOSE 127* 147*  BUN 23 21  CREATININE 1.25 1.12  CALCIUM 9.8 9.9    PT/INR: No results found for this basename: LABPROT,INR in the last 72 hours    Assessment/Plan: S/P Procedure(s) (LRB): CORONARY ARTERY BYPASS GRAFTING (CABG) (N/A) CV- AF, now maintaining SR. Continue Amio, Lopressor.  Neuro- postop delirium, improving. Continue to monitor.  Vol overload- diurese.  DM- sugars stable,continue home meds.  Pulm- off O2, breathing stable. Cr had bumped a bit yesterday.  Will recheck in am. Decrease :asix to daily and monitor. Continue PT/OT. Possibly ready for discharge over the weekend if remains stable.  Will d/c EPWs and CT sutures.  LOS: 11 days    COLLINS,GINA H 08/24/2012  Patient seen and examined. Agree  with above. He had foley replaced for urinary retention. Will leave in today and do voiding trial in AM

## 2012-08-24 NOTE — Progress Notes (Signed)
Only remaining Atrial pacing  Wires removed per protocol without difficulty, pt tolerated well, instructed bedrest for 1 hour, vital signs per protocol began. Egbert Garibaldi A

## 2012-08-24 NOTE — Progress Notes (Signed)
Pt up to bathroom with assist had BM, and bright red blood noted in commode, assisted back to bed, noted rectal area redden. Aloe  Skin protectant cream applied, will follow close and notify MD

## 2012-08-24 NOTE — Progress Notes (Signed)
Physical Therapy Treatment Patient Details Name: [name] MRN: [id number] DOB: 1946/01/14 Today's Date: 08/24/2012 Time: 9528-4132 PT Time Calculation (min): 23 min  PT Assessment / Plan / Recommendation Comments on Treatment Session  pt presents with Nstemi and CABG.  pt needs cueing for sternal precautions and discussed use of RW for increased safety with gait at home until HHPT can continue working on gait without an AD.      Follow Up Recommendations  Home health PT    Barriers to Discharge        Equipment Recommendations  Rolling walker with 5" wheels    Recommendations for Other Services    Frequency Min 3X/week   Plan Discharge plan remains appropriate;Frequency remains appropriate    Precautions / Restrictions Precautions Precautions: Sternal;Fall Restrictions Weight Bearing Restrictions: No   Pertinent Vitals/Pain Notes incision feels tight.      Mobility  Bed Mobility Bed Mobility: Rolling Left;Left Sidelying to Sit;Sitting - Scoot to Edge of Bed Rolling Left: 5: Supervision Left Sidelying to Sit: 5: Supervision;With rails Sitting - Scoot to Edge of Bed: 5: Supervision Details for Bed Mobility Assistance: cue to minimize use of UEs.  demos good log roll Transfers Transfers: Sit to Stand;Stand to Sit Sit to Stand: 5: Supervision;From bed Stand to Sit: 5: Supervision;To chair/3-in-1 Details for Transfer Assistance: cues to minimize use of UEs for sternal precautions.   Ambulation/Gait Ambulation/Gait Assistance: 4: Min guard Ambulation Distance (Feet): 300 Feet Assistive device: None Ambulation/Gait Assistance Details: pt mildly unsteady without AD.  Discussed use of RW for home safety.   Gait Pattern: Narrow base of support;Step-through pattern;Decreased stride length Stairs: Yes Stairs Assistance: 4: Min guard Stair Management Technique: One rail Right;Forwards Number of Stairs: 11  Wheelchair Mobility Wheelchair Mobility: No    Exercises     PT  Diagnosis:    PT Problem List:   PT Treatment Interventions:     PT Goals Acute Rehab PT Goals Time For Goal Achievement: 08/30/12 PT Goal: Supine/Side to Sit - Progress: Progressing toward goal PT Goal: Sit to Stand - Progress: Met PT Goal: Stand to Sit - Progress: Met Pt will Ambulate: >150 feet;with supervision;with least restrictive assistive device PT Goal: Ambulate - Progress: Progressing toward goal PT Goal: Up/Down Stairs - Progress: Met  Visit Information  Last PT Received On: 08/24/12 Assistance Needed: +1    Subjective Data  Subjective: I hope they let me go home today.     Cognition  Overall Cognitive Status: Impaired Area of Impairment: Memory;Safety/judgement;Awareness of deficits Arousal/Alertness: Awake/alert Behavior During Session: [facility] for tasks performed Memory: Decreased recall of precautions Safety/Judgement: Decreased awareness of safety precautions;Decreased safety judgement for tasks assessed;Decreased awareness of need for assistance;Impulsive    Balance  Balance Balance Assessed: No  End of Session PT - End of Session Equipment Utilized During Treatment: Gait belt Activity Tolerance: Patient tolerated treatment well Patient left: in chair;with call bell/phone within reach;with chair alarm set Nurse Communication: Mobility status   GP     Sunny Schlein, [place] [phone] 08/24/2012, 9:30 AM

## 2012-08-24 NOTE — Progress Notes (Signed)
CBG up to 96, pt oob in chair, finishing his dinner, will continue to follow.

## 2012-08-24 NOTE — Telephone Encounter (Signed)
Attempted post procedure follow up call with no answer.  VM left for pt to return call with any questions or concerns

## 2012-08-24 NOTE — Progress Notes (Signed)
Hypoglycemic Event  CBG: 54  Treatment: 15 GM carbohydrate snack  Symptoms: Sweaty  Follow-up CBG: Time:1657 CBG Result:59   Possible Reasons for Event: inadequate food intake    Comments/MD notified:Dr. Dorris Fetch notified orders received     [name] A  Remember to initiate Hypoglycemia Order Set & complete

## 2012-08-25 LAB — BASIC METABOLIC PANEL
CO2: 31 mEq/L (ref 19–32)
Calcium: 9.8 mg/dL (ref 8.4–10.5)
Chloride: 96 mEq/L (ref 96–112)
Glucose, Bld: 138 mg/dL — ABNORMAL HIGH (ref 70–99)
Potassium: 4.3 mEq/L (ref 3.5–5.1)
Sodium: 134 mEq/L — ABNORMAL LOW (ref 135–145)

## 2012-08-25 LAB — URINE CULTURE
Colony Count: NO GROWTH
Culture: NO GROWTH

## 2012-08-25 LAB — CLOSTRIDIUM DIFFICILE BY PCR: Toxigenic C. Difficile by PCR: NEGATIVE

## 2012-08-25 LAB — GLUCOSE, CAPILLARY
Glucose-Capillary: 136 mg/dL — ABNORMAL HIGH (ref 70–99)
Glucose-Capillary: 120 mg/dL — ABNORMAL HIGH (ref 70–99)
Glucose-Capillary: 76 mg/dL (ref 70–99)

## 2012-08-25 MED ORDER — FUROSEMIDE 40 MG PO TABS: 40.0000 mg | ORAL_TABLET | Freq: Every day | ORAL | Status: DC

## 2012-08-25 MED ORDER — TRAMADOL HCL 50 MG PO TABS: 50.0000 mg | ORAL_TABLET | Freq: Four times a day (QID) | ORAL | Status: AC | PRN

## 2012-08-25 MED ORDER — HYDROCORTISONE 2.5 % RE CREA: TOPICAL_CREAM | Freq: Three times a day (TID) | RECTAL | Status: AC | PRN

## 2012-08-25 MED ORDER — AMIODARONE HCL 200 MG PO TABS: 200.0000 mg | ORAL_TABLET | Freq: Two times a day (BID) | ORAL | Status: DC

## 2012-08-25 MED ORDER — INSULIN GLARGINE 100 UNIT/ML ~~LOC~~ SOLN
30.0000 [IU] | Freq: Every day | SUBCUTANEOUS | Status: DC
  Administered 2012-08-25 – 2012-08-26 (×2): 30 [IU] via SUBCUTANEOUS

## 2012-08-25 MED ORDER — POTASSIUM CHLORIDE CRYS ER 20 MEQ PO TBCR: 20.0000 meq | EXTENDED_RELEASE_TABLET | Freq: Every day | ORAL | Status: DC

## 2012-08-25 MED ORDER — ATORVASTATIN CALCIUM 10 MG PO TABS: 80.0000 mg | ORAL_TABLET | Freq: Every day | ORAL | Status: DC

## 2012-08-25 MED ORDER — ASPIRIN 325 MG PO TBEC: 325.0000 mg | DELAYED_RELEASE_TABLET | Freq: Every day | ORAL | Status: DC

## 2012-08-25 MED ORDER — WITCH HAZEL-GLYCERIN EX PADS: MEDICATED_PAD | CUTANEOUS | Status: DC | PRN

## 2012-08-25 MED ORDER — METOPROLOL TARTRATE 12.5 MG HALF TABLET: 12.5000 mg | ORAL_TABLET | Freq: Two times a day (BID) | ORAL | Status: DC

## 2012-08-25 MED ORDER — INSULIN GLARGINE 100 UNIT/ML ~~LOC~~ SOLN: 40.0000 [IU] | Freq: Every day | SUBCUTANEOUS | Status: DC

## 2012-08-25 MED ORDER — METOPROLOL TARTRATE 12.5 MG HALF TABLET
12.5000 mg | ORAL_TABLET | Freq: Two times a day (BID) | ORAL | Status: DC
  Administered 2012-08-25 – 2012-08-26 (×3): 12.5 mg via ORAL
  Filled 2012-08-25 (×4): qty 1

## 2012-08-25 NOTE — Progress Notes (Addendum)
[address]            [place] [address]          [phone]     9 Days Post-Op  Procedure(s) (LRB): CORONARY ARTERY BYPASS GRAFTING (CABG) (N/A) Subjective: Says he voided a small amount after catheter removed but not measured or recorded  Objective  Telemetry sinus brady/SR  Temp:  [97.5 F (36.4 C)-98.4 F (36.9 C)] 97.6 F (36.4 C) (09/14 0446) Pulse Rate:  [62-71] 62  (09/14 0446) Resp:  [18] 18  (09/14 0446) BP: (109-122)/(58-74) 122/74 mmHg (09/14 0446) SpO2:  [91 %] 91 % (09/14 0446) Weight:  [206 lb 5.6 oz (93.6 kg)] 206 lb 5.6 oz (93.6 kg) (09/14 0445)   Intake/Output Summary (Last 24 hours) at 08/25/12 0928 Last data filed at 08/25/12 0545  Gross per 24 hour  Intake    240 ml  Output    551 ml  Net   -311 ml       General appearance: alert, cooperative and no distress Heart: regular rate and rhythm and S1, S2 normal Lungs: mildly dim in bases Abdomen: soft/non-tender, min distension Extremities: + BLE edema Wound: incisions healing well  Lab Results:  Basename 08/25/12 0610 08/23/12 0420  NA 134* 138  K 4.3 3.8  CL 96 99  CO2 31 31  GLUCOSE 138* 127*  BUN 25* 23  CREATININE 1.39* 1.25  CALCIUM 9.8 9.8  MG -- --  PHOS -- --   No results found for this basename: AST:2,ALT:2,ALKPHOS:2,BILITOT:2,PROT:2,ALBUMIN:2 in the last 72 hours No results found for this basename: LIPASE:2,AMYLASE:2 in the last 72 hours  Basename 08/23/12 0420  WBC 11.3*  NEUTROABS --  HGB 12.4*  HCT 38.1*  MCV 90.7  PLT 305   No results found for this basename: CKTOTAL:4,CKMB:4,TROPONINI:4 in the last 72 hours No components found with this basename: POCBNP:3 No results found for this basename: DDIMER in the last 72 hours No results found for this basename: HGBA1C in the last 72 hours No results found for this basename: CHOL,HDL,LDLCALC,TRIG,CHOLHDL in the last 72 hours No results found for this basename:  TSH,T4TOTAL,FREET3,T3FREE,THYROIDAB in the last 72 hours No results found for this basename: VITAMINB12,FOLATE,FERRITIN,TIBC,IRON,RETICCTPCT in the last 72 hours  Medications: Scheduled    . amiodarone  200 mg Oral BID  . aspirin EC  325 mg Oral Daily  . atorvastatin  80 mg Oral QHS  . feeding supplement  237 mL Oral TID BM  . furosemide  40 mg Oral Daily  . insulin aspart  0-15 Units Subcutaneous TID WC  . insulin glargine  20 Units Subcutaneous Daily  . metFORMIN  1,000 mg Oral BID WC  . metoprolol tartrate  25 mg Oral BID   Or  . metoprolol tartrate  25 mg Per Tube BID  . pantoprazole  40 mg Oral Q1200  . potassium chloride  40 mEq Oral Daily  . sodium chloride  3 mL Intravenous Q12H  . Tamsulosin HCl  0.4 mg Oral QPC supper  . DISCONTD: insulin aspart  3 Units Subcutaneous TID WC     Radiology/Studies:  No results found.  INR: Will add last result for INR, ABG once components are confirmed Will add last 4 CBG results once components are confirmed  Assessment/Plan: S/P Procedure(s) (LRB): CORONARY ARTERY BYPASS GRAFTING (CABG) (N/A)  1. Cont voiding trial . Told him to void  into container so we can measure, cont Tamsulosin 2 neuro status conts to improve 3 CBG's 96-259 range, increase lantus some 4 creat slowly rising, cont diuresis 5 decrease metoprolol, may have to decrease amio    LOS: 12 days    GOLD,WAYNE E 9/14/20139:28 AM  Having difficulty voiding. Will do bladder scan, and place foley if needed If catheter needs to be replaced will plan to d/c home with catheter tomorrow(if creatinine OK) and have him follow up with his urologist in [place]

## 2012-08-25 NOTE — Progress Notes (Signed)
CARDIAC REHAB PHASE I   PRE:  Rate/Rhythm: 62 SR  BP:  Supine:   Sitting: 114/56  Standing:    SaO2: 92 RA  MODE:  Ambulation: 890 ft   POST:  Rate/Rhythem: 75  BP:  Supine:   Sitting: 118/60  Standing:    SaO2: 92 RA 0915-1010 Assisted X 1 and used walker to ambulate. Gait steady with walker VS stable. Pt states that he feels much better today. Pt to recliner after walk with call light in reach.  Beatrix Fetters

## 2012-08-25 NOTE — Progress Notes (Signed)
Bladder scan revealed 1000 ml in bladder. Foley catheter placed per MD order.

## 2012-08-26 LAB — BASIC METABOLIC PANEL
BUN: 19 mg/dL (ref 6–23)
Calcium: 10.1 mg/dL (ref 8.4–10.5)
GFR calc Af Amer: 62 mL/min — ABNORMAL LOW (ref >90)
GFR calc non Af Amer: 54 mL/min — ABNORMAL LOW (ref >90)
Potassium: 4.6 mEq/L (ref 3.5–5.1)

## 2012-08-26 NOTE — Plan of Care (Signed)
Problem: Phase III Progression Outcomes Goal: No angina with increased activity Outcome: Completed/Met Date Met:  08/26/12 Denies chest pain. Ambulates well within room. Denies sob with exertion. Goal: Tolerating diet Outcome: Completed/Met Date Met:  08/26/12 States appetite is very good. Pt on carb modified diet. Denies nausea or vomiting. Goal: Discharge plan remains appropriate-arrangements made Outcome: Progressing Pt will be discharged home with family. Goal: Vital signs stable Outcome: Completed/Met Date Met:  08/26/12 Vital signs stable. Denies pain.

## 2012-08-26 NOTE — Progress Notes (Signed)
Pt discharged per Md order and protocol. All discharge instructions reviewed with patient and wife. All rx'es given and all questions answered. Foley bag switched over to leg bag for transportation. Pt and wife instructed on foley care and switching over to leg bag. Pt and wife able to verbalize care and able to demonstrate. Pt aware of all follow up appointments.

## 2012-08-26 NOTE — Progress Notes (Addendum)
Pt is forgetful at times. Pt can be non compliant with cares. Foley in place due to pt's inability to void. Will consider DC in the am. Will leave MD a note. Coke and orange juice given x 2 during shift for a borderline low CBG. Will continue to monitor.

## 2012-08-26 NOTE — Discharge Planning (Signed)
[address]            [place] [address]          [phone]      [name] 01-15-1946 66 y.o. [id number]  08/13/2012   Loreli Slot, MD  chest pain cp cad  Reason for Consult: 3 vessel CAD with unstable angina  Referring Physician: Dr. [place]  [name] is an 66 y.o. male.  HPI: 66 year-old male with a history of HTN, hyperlipidemia, diabetes and known CAD, who presents with a cc/o chest pain.  His history of CAD dates back to 2001 when he had a NSTEMI treated with PTCA and stenting of his RCA. He has not had any regular cardiology followup since being treated for his MI in 2001.  He presented to [facility] ED on the morning of 08/13/12 after experiencing persistent CP unrelieved with antacids. He had been having episodes dating back to 6 days PTA. His first episode occurred after he had changed a tire. It was associated with nausea and vomiting, and the anterior chest discomfort was reminiscent of his MI presentation, although not as severe and with no associated LUE radiation. The morning of admission he had a similar episode of chest discomfort and diaphoresis, although without the associated nausea/vomiting, after eating breakfast. He did not have any NTG at home, but did take some TUMS, believing his pain might be indigestion. His symptoms persisted, and he was taken to the ED by his wife. He was treated with 4 baby aspirin, 3 NTG tablets, nitro paste, and subsequently placed on both intravenous NTG and heparin. His pain did then resolve. Initial cardiac markers showed a troponin I of 0.20. EKG showed NSR with an old inferior MI, but no acute changes.  He was transferred to [facility]. Yesterday he underwent cardiac cath and was found to have severe 3 vessel CAD with an EF of 35-40%. He has been pain free since admission. After evaluation by Dr. Dorris Fetch and coronary artery bypass grafting surgery was scheduled and he was admitted  this hospitalization for the procedure.  Past Medical History   Diagnosis  Date   .  CAD (coronary artery disease)    .  HTN (hypertension)    .  HLD (hyperlipidemia)    .  GERD (gastroesophageal reflux disease)    .  Hiatal hernia    .  Prostate cancer      Status post radiation treatment for   .  Tobacco abuse    .  Myocardial infarction    .  Diabetes mellitus    .  Peripheral vascular disease    .  Anginal pain     Past Surgical History   Procedure  Date   .  Back surgery    .  Appendectomy    History reviewed. No pertinent family history.  Social History: reports that he quit smoking about 39 years ago. He does not have any smokeless tobacco history on file. He reports that he does not drink alcohol or use illicit drugs.  Allergies: No Known Allergies  Medications:  Prior to Admission:  Prescriptions prior to admission   Medication  Sig  Dispense  Refill   .  atorvastatin (LIPITOR) 10 MG tablet  Take 10 mg by mouth at bedtime.     .  insulin glargine (LANTUS) 100 UNIT/ML injection  Inject  40 Units into the skin at bedtime.     [name]  lisinopril (PRINIVIL,ZESTRIL) 10 MG tablet  Take 10 mg by mouth daily.     .  metFORMIN (GLUCOPHAGE-XR) 500 MG 24 hr tablet  Take 1,000 mg by mouth 2 (two) times daily.     .  Tamsulosin HCl (FLOMAX) 0.4 MG CAPS  Take 0.4 mg by mouth daily after supper.        Hospital Course:  The patient was admitted to the hospital and taken to the operating room on 08/13/2012 - 08/16/2012 and underwent Procedure(s):  OPERATIVE REPORT  PREOPERATIVE DIAGNOSES:  1. Severe three-vessel coronary artery disease with unstable coronary  syndrome.  2. Mild-to-moderate mitral regurgitation.  POSTOPERATIVE DIAGNOSES:  1. Three-vessel coronary artery disease with unstable coronary  syndrome.  2. Mild mitral regurgitation.  PROCEDURES: Median sternotomy, extracorporeal circulation; coronary  artery bypass grafting x4 (left internal mammary artery to LAD,  saphenous  vein graft to obtuse marginal 2, sequential saphenous vein  graft to acute marginal and posterior descending); endoscopic vein  harvest, right leg.  SURGEON: Salvatore Decent. Dorris Fetch, MD  ASSISTANT: Doree Fudge, PA  ANESTHESIA: General.  FINDINGS: Transesophageal echocardiography revealed mild mitral  regurgitation pre and post-bypass. LAD good quality at the site of the  anastomosis but diffusely diseased distal to that. Acute marginal large  vessel traversed to the distal septum and was the dominant inferior  branch and was the vessel seen filling via collaterals on catheterization.  Posterior descending 1 mm poor quality target, OM2 of 1.5 mm fair  quality target.  There were no intraoperative complications and the patient was taken from the operating room to the surgical intensive care unit in good condition.    Post operative hospital course:  Overall the patient has progressed nicely. He has maintained stable hemodynamics but has had postoperative atrial fibrillation. He has subsequently been chemically cardioverted to normal sinus rhythm which is maintained for several days. He was weaned from the ventilator without difficulty. All routine lines, monitors and drainage devices have been discontinued with the exception of the Foley catheter. He has had postoperative urinary retention. He has been restarted on his oral Flomax. He has failed 2 voiding trials and therefore will be discharged with Foley catheter and plans for urology followup as an outpatient. He has a known history of prostate cancer. He did have some postoperative confusion which is resolved and he currently is back to his neurological baseline. He's had a moderate postoperative volume overload but has responded well to diuretics. He did have a acute increase in his creatinine but has stabilized over time to the normal range. He is responding well to rehabilitation modalities with cardiac rehabilitation department as well as  physical and occupational therapy. His diabetes is under adequate control using standard glue commander protocols and transition to his medication regimen including metformin and in addition he continues Lantus insulin. Incisions are healing well without evidence of infection. He is tolerating diet. Oxygen has been weaned and he maintains adequate saturations on room air. Currently his status is felt to be stable for discharge on today's date.                        .   No results found for this basename: WBC:2,HGB:2,HCT:2,PLT:2 in the last 72 hours No results found for this basename: INR:2 in the last 72 hours   Discharge Instructions:  The patient is discharged to home with extensive instructions on  wound care and progressive ambulation.  They are instructed not to drive or perform any heavy lifting until returning to see the physician in his office.  Discharge Diagnosis:  chest pain cp cad  postoperative atrial fibrillation Postoperative urinary retention  Secondary Diagnosis: Patient Active Problem List  Diagnosis  . NSTEMI (non-ST elevated myocardial infarction)  . IDDM (insulin dependent diabetes mellitus)  . HTN (hypertension)  . Hyperlipidemia   Past Medical History  Diagnosis Date  . CAD (coronary artery disease)   . HTN (hypertension)   . HLD (hyperlipidemia)   . GERD (gastroesophageal reflux disease)   . Hiatal hernia   . Prostate cancer     Status post radiation treatment for  . Tobacco abuse   . Myocardial infarction   . Diabetes mellitus   . Peripheral vascular disease   . Anginal pain        [name], [name]  Home Medication Instructions [id number]   Printed on:08/26/12 1111  Medication Information                    metFORMIN (GLUCOPHAGE-XR) 500 MG 24 hr tablet Take 1,000 mg by mouth 2 (two) times daily.            Tamsulosin HCl (FLOMAX) 0.4 MG CAPS Take 0.4 mg by mouth daily after supper.           amiodarone (PACERONE) 200 MG tablet Take 1  tablet (200 mg total) by mouth 2 (two) times daily.           aspirin EC 325 MG EC tablet Take 1 tablet (325 mg total) by mouth daily.           atorvastatin (LIPITOR) 10 MG tablet Take 8 tablets (80 mg total) by mouth at bedtime.           furosemide (LASIX) 40 MG tablet Take 1 tablet (40 mg total) by mouth daily.           hydrocortisone (ANUSOL-HC) 2.5 % rectal cream Place rectally 3 (three) times daily as needed.           insulin glargine (LANTUS) 100 UNIT/ML injection Inject 40 Units into the skin at bedtime.           metoprolol tartrate (LOPRESSOR) 12.5 mg TABS Take 0.5 tablets (12.5 mg total) by mouth 2 (two) times daily.           potassium chloride SA (K-DUR,KLOR-CON) 20 MEQ tablet Take 1 tablet (20 mEq total) by mouth daily.           traMADol (ULTRAM) 50 MG tablet Take 1 tablet (50 mg total) by mouth every 6 (six) hours as needed.           witch hazel-glycerin (TUCKS) pad Apply topically as needed.             Disposition:  for discharge home with Foley catheter  Patient's condition is Good  Gershon Crane, PA-C 08/26/2012  11:11 AM

## 2012-08-26 NOTE — Progress Notes (Addendum)
[address]            [place] [address]          [phone]     10 Days Post-Op  Procedure(s) (LRB): CORONARY ARTERY BYPASS GRAFTING (CABG) (N/A) Subjective: Feeling well, has not had blood draw yet  Objective  Telemetry sinus with rare pvc's  Temp:  [98.2 F (36.8 C)-98.5 F (36.9 C)] 98.5 F (36.9 C) (09/15 0619) Pulse Rate:  [61-65] 61  (09/15 0619) Resp:  [16-18] 18  (09/15 0619) BP: (116-137)/(70-80) 137/80 mmHg (09/15 0619) SpO2:  [90 %-93 %] 93 % (09/15 0619) Weight:  [203 lb 12.8 oz (92.443 kg)] 203 lb 12.8 oz (92.443 kg) (09/15 1324)   Intake/Output Summary (Last 24 hours) at 08/26/12 0727 Last data filed at 08/26/12 0500  Gross per 24 hour  Intake    240 ml  Output   3050 ml  Net  -2810 ml       General appearance: alert, cooperative and no distress Heart: regular rate and rhythm and S1, S2 normal Lungs: clear to auscultation bilaterally Abdomen: soft, nontender Extremities: BLE edema. improved Wound: incisions healing well  Lab Results:  Basename 08/25/12 0610  NA 134*  K 4.3  CL 96  CO2 31  GLUCOSE 138*  BUN 25*  CREATININE 1.39*  CALCIUM 9.8  MG --  PHOS --   No results found for this basename: AST:2,ALT:2,ALKPHOS:2,BILITOT:2,PROT:2,ALBUMIN:2 in the last 72 hours No results found for this basename: LIPASE:2,AMYLASE:2 in the last 72 hours No results found for this basename: WBC:2,NEUTROABS:2,HGB:2,HCT:2,MCV:2,PLT:2 in the last 72 hours No results found for this basename: CKTOTAL:4,CKMB:4,TROPONINI:4 in the last 72 hours No components found with this basename: POCBNP:3 No results found for this basename: DDIMER in the last 72 hours No results found for this basename: HGBA1C in the last 72 hours No results found for this basename: CHOL,HDL,LDLCALC,TRIG,CHOLHDL in the last 72 hours No results found for this basename: TSH,T4TOTAL,FREET3,T3FREE,THYROIDAB in the last 72 hours No results found for this  basename: VITAMINB12,FOLATE,FERRITIN,TIBC,IRON,RETICCTPCT in the last 72 hours  Medications: Scheduled    . amiodarone  200 mg Oral BID  . aspirin EC  325 mg Oral Daily  . atorvastatin  80 mg Oral QHS  . feeding supplement  237 mL Oral TID BM  . insulin aspart  0-15 Units Subcutaneous TID WC  . insulin glargine  30 Units Subcutaneous Daily  . metFORMIN  1,000 mg Oral BID WC  . metoprolol tartrate  12.5 mg Oral BID  . pantoprazole  40 mg Oral Q1200  . sodium chloride  3 mL Intravenous Q12H  . Tamsulosin HCl  0.4 mg Oral QPC supper  . DISCONTD: furosemide  40 mg Oral Daily  . DISCONTD: insulin glargine  20 Units Subcutaneous Daily  . DISCONTD: metoprolol tartrate  25 mg Per Tube BID  . DISCONTD: metoprolol tartrate  25 mg Oral BID  . DISCONTD: potassium chloride  40 mEq Oral Daily     Radiology/Studies:  No results found.  INR: Will add last result for INR, ABG once components are confirmed Will add last 4 CBG results once components are confirmed  Assessment/Plan: S/P Procedure(s) (LRB): CORONARY ARTERY BYPASS GRAFTING (CABG) (N/A)  1 await results of creat this am 2 foley in place, will need arrangements with Dr Linna Darner -urology 3 cont diuresis 4 sugars well controlled 5 cdiff -negative  LOS:  13 days    GOLD,WAYNE E 9/15/20137:27 AM    Creatinine down to 1.32 Will d/c home today Needs to follow up with Dr. Jerre Simon in [place] re voiding issues

## 2012-08-30 ENCOUNTER — Telehealth: Payer: Self-pay | Admitting: *Deleted

## 2012-08-30 NOTE — Telephone Encounter (Signed)
HH reporting low BP today - 90/58.  Will be seeing [place] for eph on 9/26.

## 2012-08-30 NOTE — Telephone Encounter (Signed)
Thanks for the FYI

## 2012-08-30 NOTE — Telephone Encounter (Signed)
Left message to return call 

## 2012-08-31 NOTE — Telephone Encounter (Signed)
Discussed with patient.  States he is feeling fine & was out walking yesterday.  No complaints of dizziness.  Stated he felt fine the day [facility] nurse was out to see him.  Has eph scheduled for 9/26.

## 2012-09-03 ENCOUNTER — Encounter: Payer: Self-pay | Admitting: Cardiology

## 2012-09-03 ENCOUNTER — Ambulatory Visit (INDEPENDENT_AMBULATORY_CARE_PROVIDER_SITE_OTHER): Payer: Medicare Other | Admitting: Cardiology

## 2012-09-03 VITALS — BP 100/60 | HR 66 | Ht 69.0 in | Wt 186.8 lb

## 2012-09-03 DIAGNOSIS — I214 Non-ST elevation (NSTEMI) myocardial infarction: Secondary | ICD-10-CM

## 2012-09-03 DIAGNOSIS — I2510 Atherosclerotic heart disease of native coronary artery without angina pectoris: Secondary | ICD-10-CM

## 2012-09-03 MED ORDER — AMIODARONE HCL 200 MG PO TABS: 200.0000 mg | ORAL_TABLET | Freq: Every day | ORAL | Status: DC

## 2012-09-03 MED ORDER — ZOLPIDEM TARTRATE 10 MG PO TABS: 10.0000 mg | ORAL_TABLET | Freq: Every evening | ORAL | Status: DC | PRN

## 2012-09-03 NOTE — Patient Instructions (Addendum)
Your physician recommends that you schedule a follow-up appointment in: 1 month. Your physician has recommended you make the following change in your medication: Decreased amiodarone 200 mg to daily. Start ambien 10 mg at bedtime. All other medications will remain the same. You have been given a printed prescription for ambien and your pharmacy has been notified about the decrease with your amiodarone.

## 2012-09-03 NOTE — Progress Notes (Signed)
HPI The patient presents for followup after CABG. Since going home from the hospital he's had a difficult time. He had diarrhea that is slowly resolving. He reports he was negative for C. difficile. He's had continued insomnia. He has had only mild substernal discomfort. He's had no substernal chest pressure, neck or arm discomfort. He's had no shortness of breath, PND or orthopnea. He's had no fevers cough or chills. Unfortunately he has been unable to urinate and has a Foley catheter indwelling. I did speak with his urologist who has requested that he come off of his aspirin for possible TURP in the next week or so.  No Known Allergies  Current Outpatient Prescriptions  Medication Sig Dispense Refill  . amiodarone (PACERONE) 200 MG tablet Take 1 tablet (200 mg total) by mouth 2 (two) times daily.  60 tablet  1  . aspirin EC 325 MG EC tablet Take 1 tablet (325 mg total) by mouth daily.      [name] atorvastatin (LIPITOR) 10 MG tablet Take 8 tablets (80 mg total) by mouth at bedtime.  30 tablet  1  . hydrocortisone (ANUSOL-HC) 2.5 % rectal cream Place rectally 3 (three) times daily as needed.  30 g  0  . insulin glargine (LANTUS) 100 UNIT/ML injection Inject 40 Units into the skin at bedtime.  10 mL  0  . metFORMIN (GLUCOPHAGE-XR) 500 MG 24 hr tablet Take 1,000 mg by mouth 2 (two) times daily.       . metoprolol tartrate (LOPRESSOR) 12.5 mg TABS Take 0.5 tablets (12.5 mg total) by mouth 2 (two) times daily.  30 each  1  . RELION INSULIN SYRINGE 1ML/31G 31G X 5/16" 1 ML MISC 1 each by Other route as needed.       . Tamsulosin HCl (FLOMAX) 0.4 MG CAPS Take 0.4 mg by mouth daily after supper.      . traMADol (ULTRAM) 50 MG tablet Take 1 tablet (50 mg total) by mouth every 6 (six) hours as needed.  50 tablet  0  . witch hazel-glycerin (TUCKS) pad Apply topically as needed.  40 each      Past Medical History  Diagnosis Date  . CAD (coronary artery disease)   . HTN (hypertension)   . HLD  (hyperlipidemia)   . GERD (gastroesophageal reflux disease)   . Hiatal hernia   . Prostate cancer     Status post radiation treatment for  . Tobacco abuse   . Myocardial infarction   . Diabetes mellitus   . Peripheral vascular disease   . Anginal pain     Past Surgical History  Procedure Date  . Back surgery   . Appendectomy   . Coronary artery bypass graft 08/16/2012    Procedure: CORONARY ARTERY BYPASS GRAFTING (CABG);  Surgeon: Loreli Slot, MD;  Location: [facility] OR;  Service: Open Heart Surgery;  Laterality: N/A;    ROS:  As stated in the HPI and negative for all other systems.  PHYSICAL EXAM BP 100/60  Pulse 66  Ht 5\' 9"  (1.753 m)  Wt 186 lb 12.8 oz (84.732 kg)  BMI 27.59 kg/m2  SpO2 91% GENERAL:  Well appearing HEENT:  Pupils equal round and reactive, fundi not visualized, oral mucosa unremarkable NECK:  No jugular venous distention, waveform within normal limits, carotid upstroke brisk and symmetric, no bruits, no thyromegaly LYMPHATICS:  No cervical, inguinal adenopathy LUNGS:  Clear to auscultation bilaterally BACK:  No CVA tenderness CHEST:  Well healed sternotomy scar. HEART:  PMI not displaced or sustained,S1 and S2 within normal limits, no S3, no S4, no clicks, no rubs, no murmurs ABD:  Flat, positive bowel sounds normal in frequency in pitch, no bruits, no rebound, no guarding, no midline pulsatile mass, no hepatomegaly, no splenomegaly EXT:  2 plus pulses throughout, no edema, no cyanosis no clubbing SKIN:  No rashes no nodules NEURO:  Cranial nerves II through XII grossly intact, motor grossly intact throughout PSYCH:  Cognitively intact, oriented to person place and time   ASSESSMENT AND PLAN  CAD/CABG - At this point I'll make no change his meds as set as listed below. He has followup scheduled with Dr. Dorris Fetch.  Atrial fibrillation - Reduce his amiodarone to 200 mg daily. Most likely get rid of this when I see him back in a few  weeks.  Prostatic hypertrophy - I spoke with his urologist. They would be difficult to have another operative procedure disclosed to his bypass. I would not want him stopping his aspirin this soon. Therefore, he will likely hold all and continue with the indwelling Foley catheter until approximately 6 weeks after surgery unless there were further problems.  I will discuss this further with Dr. Dorris Fetch.  Insomnia - I will take the liberty of giving him a short-term prescription for Ambien.  Hyperlipidemia - He will continue the meds as listed. We will follow this in the future with a goal LDL less than 70.

## 2012-09-06 ENCOUNTER — Encounter: Payer: Medicare Other | Admitting: Physician Assistant

## 2012-09-13 NOTE — Discharge Summary (Signed)
Patient Information       Patient Name Sex DOB SSN    [name], [name] Male August 31, 1946 [id number]       Discharge Planning signed by Rowe Clack, PA at 08/26/12 1122     Author: Rowe Clack, PA Service: Cardiothoracic Surgery Author Type: Physician Assistant    Filed: 08/26/12 1122 Note Time: 08/26/12 1111                             [address]                       [place] [id number]                                [phone]                                   [name] 10-14-1946 67 y.o. [id number]   08/13/2012     Tanner Slot, MD   chest pain cp cad   Reason for Consult: 3 vessel CAD with unstable angina   Referring Physician: Dr. [place]   [name] is an 66 y.o. male.   HPI: 66 year-old male with a history of HTN, hyperlipidemia, diabetes and known CAD, who presents with a cc/o chest pain.   His history of CAD dates back to 2001 when he had a NSTEMI treated with PTCA and stenting of his RCA. He has not had any regular cardiology followup since being treated for his MI in 2001.   He presented to [facility] ED on the morning of 08/13/12 after experiencing persistent CP unrelieved with antacids. He had been having episodes dating back to 6 days PTA. His first episode occurred after he had changed a tire. It was associated with nausea and vomiting, and the anterior chest discomfort was reminiscent of his MI presentation, although not as severe and with no associated LUE radiation. The morning of admission he had a similar episode of chest discomfort and diaphoresis, although without the associated nausea/vomiting, after eating breakfast. He did not have any NTG at home, but did take some TUMS, believing his pain might be indigestion. His symptoms persisted, and he was taken to the ED by his wife. He was treated with 4 baby aspirin, 3 NTG tablets, nitro paste, and subsequently placed on both intravenous NTG and heparin. His pain did then  resolve. Initial cardiac markers showed a troponin I of 0.20. EKG showed NSR with an old inferior MI, but no acute changes.   He was transferred to [facility]. Yesterday he underwent cardiac cath and was found to have severe 3 vessel CAD with an EF of 35-40%. He has been pain free since admission. After evaluation by Dr. Dorris [name] and coronary artery bypass grafting surgery was scheduled and he was admitted this hospitalization for the procedure.    Past Medical History    Diagnosis   Date    .   CAD (coronary artery disease)      .   HTN (hypertension)      .   HLD (hyperlipidemia)      .   GERD (gastroesophageal reflux disease)      .   Hiatal hernia      .  Prostate cancer          Status post radiation treatment for    .   Tobacco abuse      .   Myocardial infarction      .   Diabetes mellitus      .   Peripheral vascular disease      .   Anginal pain          Past Surgical History    Procedure   Date    .   Back surgery      .   Appendectomy       History reviewed. No pertinent family history.   Social History: reports that he quit smoking about 39 years ago. He does not have any smokeless tobacco history on file. He reports that he does not drink alcohol or use illicit drugs.   Allergies: No Known Allergies   Medications:   Prior to Admission:   Prescriptions prior to admission    Medication   Sig   Dispense   Refill    .   atorvastatin (LIPITOR) 10 MG tablet   Take 10 mg by mouth at bedtime.        .   insulin glargine (LANTUS) 100 UNIT/ML injection   Inject 40 Units into the skin at bedtime.        [name]   lisinopril (PRINIVIL,ZESTRIL) 10 MG tablet   Take 10 mg by mouth daily.        .   metFORMIN (GLUCOPHAGE-XR) 500 MG 24 hr tablet   Take 1,000 mg by mouth 2 (two) times daily.        .   Tamsulosin HCl (FLOMAX) 0.4 MG CAPS   Take 0.4 mg by mouth daily after supper.               Hospital Course:  The patient was admitted to the hospital and taken to the operating room on  08/13/2012 - 08/16/2012 and underwent Procedure(s):   OPERATIVE REPORT   PREOPERATIVE DIAGNOSES:   1. Severe three-vessel coronary artery disease with unstable coronary   syndrome.   2. Mild-to-moderate mitral regurgitation.   POSTOPERATIVE DIAGNOSES:   1. Three-vessel coronary artery disease with unstable coronary   syndrome.   2. Mild mitral regurgitation.   PROCEDURES: Median sternotomy, extracorporeal circulation; coronary   artery bypass grafting x4 (left internal mammary artery to LAD,   saphenous vein graft to obtuse marginal 2, sequential saphenous vein   graft to acute marginal and posterior descending); endoscopic vein   harvest, right leg.   SURGEON: [name]. Tanner Fetch, MD   ASSISTANT: Tanner Fudge, PA   ANESTHESIA: General.   FINDINGS: Transesophageal echocardiography revealed mild mitral   regurgitation pre and post-bypass. LAD good quality at the site of the   anastomosis but diffusely diseased distal to that. Acute marginal large   vessel traversed to the distal septum and was the dominant inferior   branch and was the vessel seen filling via collaterals on catheterization.   Posterior descending 1 mm poor quality target, OM2 of 1.5 mm fair   quality target.   There were no intraoperative complications and the patient was taken from the operating room to the surgical intensive care unit in good condition.      Post operative hospital course:   Overall the patient has progressed nicely. He has maintained stable hemodynamics but has had postoperative atrial fibrillation. He has subsequently been chemically cardioverted to normal sinus rhythm which  is maintained for several days. He was weaned from the ventilator without difficulty. All routine lines, monitors and drainage devices have been discontinued with the exception of the Foley catheter. He has had postoperative urinary retention. He has been restarted on his oral Flomax. He has failed 2 voiding trials and  therefore will be discharged with Foley catheter and plans for urology followup as an outpatient. He has a known history of prostate cancer. He did have some postoperative confusion which is resolved and he currently is back to his neurological baseline. He's had a moderate postoperative volume overload but has responded well to diuretics. He did have a acute increase in his creatinine but has stabilized over time to the normal range. He is responding well to rehabilitation modalities with cardiac rehabilitation department as well as physical and occupational therapy. His diabetes is under adequate control using standard glue commander protocols and transition to his medication regimen including metformin and in addition he continues Lantus insulin. Incisions are healing well without evidence of infection. He is tolerating diet. Oxygen has been weaned and he maintains adequate saturations on room air. Currently his status is felt to be stable for discharge on today's date.                        .     No results found for this basename: WBC:2,HGB:2,HCT:2,PLT:2 in the last 72 hours No results found for this basename: INR:2 in the last 72 hours     Discharge Instructions:  The patient is discharged to home with extensive instructions on wound care and progressive ambulation.  They are instructed not to drive or perform any heavy lifting until returning to see the physician in his office.   Discharge Diagnosis:   chest pain cp cad  postoperative atrial fibrillation Postoperative urinary retention   Secondary Diagnosis: Patient Active Problem List   Diagnosis   .  NSTEMI (non-ST elevated myocardial infarction)   .  IDDM (insulin dependent diabetes mellitus)   .  HTN (hypertension)   .  Hyperlipidemia       Past Medical History   Diagnosis  Date   .  CAD (coronary artery disease)     .  HTN (hypertension)     .  HLD (hyperlipidemia)     .  GERD (gastroesophageal reflux disease)     .   Hiatal hernia     .  Prostate cancer         Status post radiation treatment for   .  Tobacco abuse     .  Myocardial infarction     .  Diabetes mellitus     .  Peripheral vascular disease     .  Anginal pain                 Marcelles, Clinard   Home Medication Instructions  ZOX:[id number]     Printed on:08/26/12 1111   Medication Information                                      metFORMIN (GLUCOPHAGE-XR) 500 MG 24 hr tablet  Take 1,000 mg by mouth 2 (two) times daily.                     Tamsulosin HCl (FLOMAX) 0.4 MG CAPS  Take 0.4 mg by mouth daily after supper.  amiodarone (PACERONE) 200 MG tablet  Take 1 tablet (200 mg total) by mouth 2 (two) times daily.                    aspirin EC 325 MG EC tablet  Take 1 tablet (325 mg total) by mouth daily.                    atorvastatin (LIPITOR) 10 MG tablet  Take 8 tablets (80 mg total) by mouth at bedtime.                    furosemide (LASIX) 40 MG tablet  Take 1 tablet (40 mg total) by mouth daily.                    hydrocortisone (ANUSOL-HC) 2.5 % rectal cream  Place rectally 3 (three) times daily as needed.                    insulin glargine (LANTUS) 100 UNIT/ML injection  Inject 40 Units into the skin at bedtime.                    metoprolol tartrate (LOPRESSOR) 12.5 mg TABS  Take 0.5 tablets (12.5 mg total) by mouth 2 (two) times daily.                    potassium chloride SA (K-DUR,KLOR-CON) 20 MEQ tablet  Take 1 tablet (20 mEq total) by mouth daily.                    traMADol (ULTRAM) 50 MG tablet  Take 1 tablet (50 mg total) by mouth every 6 (six) hours as needed.                    witch hazel-glycerin (TUCKS) pad  Apply topically as needed.                         Disposition:  for discharge home with Foley catheter   Patient's condition is Good   Gershon Crane, PA-C 08/26/2012   11:11 AM

## 2012-09-17 ENCOUNTER — Other Ambulatory Visit: Payer: Self-pay | Admitting: Thoracic Surgery (Cardiothoracic Vascular Surgery)

## 2012-09-17 DIAGNOSIS — Z951 Presence of aortocoronary bypass graft: Secondary | ICD-10-CM

## 2012-09-18 ENCOUNTER — Ambulatory Visit (INDEPENDENT_AMBULATORY_CARE_PROVIDER_SITE_OTHER): Payer: Self-pay | Admitting: Thoracic Surgery (Cardiothoracic Vascular Surgery)

## 2012-09-18 ENCOUNTER — Encounter: Payer: Self-pay | Admitting: Thoracic Surgery (Cardiothoracic Vascular Surgery)

## 2012-09-18 ENCOUNTER — Ambulatory Visit
Admission: RE | Admit: 2012-09-18 | Discharge: 2012-09-18 | Disposition: A | Discharge status: Home / Self Care | Payer: Medicare Other | Source: Ambulatory Visit | Attending: Thoracic Surgery (Cardiothoracic Vascular Surgery) | Admitting: Thoracic Surgery (Cardiothoracic Vascular Surgery)

## 2012-09-18 VITALS — BP 104/60 | HR 58 | Resp 16 | Ht 69.0 in | Wt 178.0 lb

## 2012-09-18 DIAGNOSIS — I2510 Atherosclerotic heart disease of native coronary artery without angina pectoris: Secondary | ICD-10-CM

## 2012-09-18 DIAGNOSIS — Z951 Presence of aortocoronary bypass graft: Secondary | ICD-10-CM

## 2012-09-18 NOTE — Progress Notes (Signed)
HPI:  [name] returns today for a scheduled postoperative followup visit. He presented with unstable angina and had 3 vessel coronary disease a catheterization. He underwent coronary bypass grafting x4 on 08/16/2012. He did have diffusely diseased vessels. Postoperatively he had atrial fibrillation which was treated with amiodarone. He also had severe urinary retention and had to be discharged home with a Foley catheter in place. He has seen Dr. Renda Rolls his urologist who has recommended a TURP. He will need to be off of aspirin prior to the TURP.  [name] had difficulty with his appetite, diarrhea, and sleep disturbance. All of those have been getting better over the past couple of weeks. Dr. Antoine Poche gave him a prescription for Ambien which helped with his sleep, although he says he's been able to sleep last few nights without it. His diarrhea is resolved. He says that food still has a different taste, but he has found a few things that he likes.  Past Medical History  Diagnosis Date  . CAD (coronary artery disease)   . HTN (hypertension)   . HLD (hyperlipidemia)   . GERD (gastroesophageal reflux disease)   . Hiatal hernia   . Prostate cancer     Status post radiation treatment for  . Tobacco abuse   . Myocardial infarction   . Diabetes mellitus   . Peripheral vascular disease   . Anginal pain       Current Outpatient Prescriptions  Medication Sig Dispense Refill  . amiodarone (PACERONE) 200 MG tablet Take 1 tablet (200 mg total) by mouth daily.  30 tablet  2  . aspirin EC 325 MG EC tablet Take 1 tablet (325 mg total) by mouth daily.      [name] atorvastatin (LIPITOR) 10 MG tablet Take 8 tablets (80 mg total) by mouth at bedtime.  30 tablet  1  . insulin glargine (LANTUS) 100 UNIT/ML injection Inject 40 Units into the skin at bedtime.  10 mL  0  . metFORMIN (GLUCOPHAGE-XR) 500 MG 24 hr tablet Take 1,000 mg by mouth 2 (two) times daily.       . metoprolol tartrate (LOPRESSOR) 12.5 mg  TABS Take 0.5 tablets (12.5 mg total) by mouth 2 (two) times daily.  30 each  1  . RELION INSULIN SYRINGE 1ML/31G 31G X 5/16" 1 ML MISC 1 each by Other route as needed.       . Tamsulosin HCl (FLOMAX) 0.4 MG CAPS Take 0.4 mg by mouth daily after supper.      . traMADol (ULTRAM) 50 MG tablet       . witch hazel-glycerin (TUCKS) pad Apply topically as needed.  40 each    . zolpidem (AMBIEN) 10 MG tablet Take 1 tablet (10 mg total) by mouth at bedtime as needed for sleep.  30 tablet  3    Physical Exam BP 104/60  Pulse 58  Resp 16  Ht 5\' 9"  (1.753 m)  Wt 178 lb (80.74 kg)  BMI 26.29 kg/m2  SpO2 98% Gen. 66 year old male in no acute distress, he has lost weight since surgery Lungs clear with equal breath sounds bilaterally Cardiac regular rate and rhythm normal S1 and S2 Sternum stable, incision clean dry and intact Leg incisions with mild erythema consistent with local irritation from sutures No peripheral edema  Diagnostic Tests: Chest x-ray 09/18/2012 CHEST - 2 VIEW  Comparison: 08/22/2012  Findings: Elevation of the left hemidiaphragm with left base  atelectasis or scarring, stable. Prior CABG. No confluent opacity  on the right. No pleural effusions or acute bony abnormality.  IMPRESSION:  Stable left base atelectasis or scarring with elevation of the left  hemidiaphragm. No visible effusions.   Impression: 66 year old gentleman now about 5 weeks out from coronary bypass grafting. For cardiac standpoint he's doing well at this point in time. He did have postoperative atrial fibrillation was discharged on amiodarone. The dosage of amiodarone was decreased to 200 mg daily by Dr. Antoine Poche. He is maintaining sinus rhythm at that dose.  I did emphasize to him the importance of control of his blood sugars and lipids in regards to long-term outcomes.  I advised him not to lift anything over 10 pounds for another 10 days. After that he can begin gradually increasing his activities. I  advised him not to try to drive a car until after he has recovered from his urologic procedure.  I agree with Dr. Antoine Poche that he should wait 6 weeks prior to stopping his aspirin for the TURP. He will be 6 weeks from surgery next Thursday. He could potentially stop his aspirin in time after that to have the TURP done. He does need to resume aspirin as soon as possible afterwards.  Plan: He'll continue to be followed by Dr. Antoine Poche in [place] for his cardiology needs.  I will be happy to see him back any time if I can be of any assistance with his care.

## 2012-09-18 NOTE — Patient Instructions (Signed)
Do not lift over 10 pounds for another 10 days  Do not drive until after your urologic surgery   May stop aspirin for procedure after next Thursday. You should restart aspirin as soon as Nurse, learning disability says it is safe

## 2012-09-20 ENCOUNTER — Telehealth: Payer: Self-pay | Admitting: Cardiology

## 2012-09-20 NOTE — Telephone Encounter (Signed)
This is a Scientist, research (physical sciences) pt. Please advise

## 2012-09-20 NOTE — Telephone Encounter (Signed)
Pt needs to stop asa and plavix 5-7 days prior and they need to know when to stop and when to start back

## 2012-09-21 ENCOUNTER — Telehealth: Payer: Self-pay | Admitting: *Deleted

## 2012-09-21 NOTE — Telephone Encounter (Signed)
He can stop his Plavix 5 days before the procedure.  If possible I would like for him to continue ASA.

## 2012-09-21 NOTE — Telephone Encounter (Signed)
Patient started vomiting on Thursday x's 1 and been vomiting today x's 3 and feels weak. Patient denies chest pain, dizziness, or shortness of breath. Nurse advised patient that he did need evaluation at ED for vomiting. Patient verbalized understanding of plan.

## 2012-09-25 NOTE — Telephone Encounter (Signed)
Response faxed to Dr. Rudean Haskell office.

## 2012-09-28 ENCOUNTER — Encounter: Payer: Self-pay | Admitting: Physician Assistant

## 2012-09-28 ENCOUNTER — Telehealth: Payer: Self-pay | Admitting: Cardiology

## 2012-09-28 ENCOUNTER — Ambulatory Visit (INDEPENDENT_AMBULATORY_CARE_PROVIDER_SITE_OTHER): Payer: Medicare Other | Admitting: Physician Assistant

## 2012-09-28 VITALS — BP 114/75 | HR 57 | Ht 69.0 in | Wt 179.0 lb

## 2012-09-28 DIAGNOSIS — I2510 Atherosclerotic heart disease of native coronary artery without angina pectoris: Secondary | ICD-10-CM | POA: Insufficient documentation

## 2012-09-28 DIAGNOSIS — I959 Hypotension, unspecified: Secondary | ICD-10-CM

## 2012-09-28 DIAGNOSIS — R110 Nausea: Secondary | ICD-10-CM

## 2012-09-28 DIAGNOSIS — I739 Peripheral vascular disease, unspecified: Secondary | ICD-10-CM | POA: Insufficient documentation

## 2012-09-28 DIAGNOSIS — N400 Enlarged prostate without lower urinary tract symptoms: Secondary | ICD-10-CM | POA: Insufficient documentation

## 2012-09-28 DIAGNOSIS — Z79899 Other long term (current) drug therapy: Secondary | ICD-10-CM

## 2012-09-28 DIAGNOSIS — I4891 Unspecified atrial fibrillation: Secondary | ICD-10-CM | POA: Insufficient documentation

## 2012-09-28 NOTE — Telephone Encounter (Signed)
[name] called the office stating that he saw Dr. Jerre Simon on 09-27-2012 and his BP was 70/50.  Stated that Dr. Jerre Simon suggested that he see [facility] as soon as he can get in and made need A medication adjustment.

## 2012-09-28 NOTE — Patient Instructions (Addendum)
   Follow up with [facility] as planned. Your physician has recommended you make the following change in your medication: Stop amiodarone. All other medications will remain the same. Please plan to stop your aspirin 5 days prior to surgery. Please check with your surgeon for this date. Your physician recommends that you return for lab work today at [facility]. Please monitor your blood pressure at least 3 times per week and let us know the results.

## 2012-09-28 NOTE — Progress Notes (Signed)
Cardiology Clinic Note  Name:  [name]   DOB:  Jun 28, 1946   MRN:  [id number] Date of Encounter: 09/28/2012, 1:09 PM Primary Care Provider:  Selinda Flavin, MD Primary Cardiologist:  [name]  HPI: [name] is a 66 y/o M with CAD with prior stenting in 2001 then NSTEMI 08/2012 and ultimately had CABG on 08/16/2012. His post-op course was complicated by some confusion, urinary retention, afib started on amiodarone, and pleural effusion requiring thoracentesis. He was recently seen by urology for prostatic hypertrophy and felt to benefit from TURP. [name] & [name] recommended waiting 6 weeks post-op to stop aspirin for his TURP -- he is 6 weeks out as of yesterday. The recommendation was also to resume ASA as soon as possible after surgery. Per [name] note 09/03/12, he planned to likely d/c amiodarone at the patient's next follow-up appointment.  [name] presented to the office today as an add-on at the advice of his urologist who told him his BP was low yesterday at 70/50. This was checked once. The patient reports at home his blood pressure typically runs 100-120/70. He has not had any lightheadness, CP, SOB or syncope. 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 His diarrhea has resolved. He has rare episodes of dizziness less than 1x/month lasting minutes not associated with any palpitations or presyncope. He feels an increased awareness of his HR when lying down in a quiet room, but denies increased or irregular rates. He feels well today. BP is 114/75. EKG shows sinus bradycardia 57bpm without acute changes. His date for TURP has not been officially set yet but he is eager to proceed.  Problem List Past Medical History  Diagnosis Date  . CAD (coronary artery disease)     a. NSTEMI s/p stent to distal RCA 03/2000. b. Inf MI s/p emergent thrombectomy/stenting mid RCA 10/2000. c. NSTEMI s/p CABGx4 (LIMA-LAD,  SVG-OM2, seq SVG-acute marginal and PD) 08/16/12 - course complicated by confusion, post-op AF, L pleural effusion with thoracentesis. d. Normal LV function by echo 08/2012.  [name] HTN (hypertension)   . HLD (hyperlipidemia)   . GERD (gastroesophageal reflux disease)   . Hiatal hernia   . Prostate cancer     Status post radiation treatment.  . Tobacco abuse   . Diabetes mellitus   . Peripheral vascular disease     a. Carotid dopplers neg 08/13/12. b. Pre-cabg ABIs - R=0.87 suggesting mild dz, L=1.29 possibly falsely elevated due to calcified vessels.  . Pleural effusion     a. L pleural eff after CABG s/p thoracentesis 08/22/12.  [name] A-fib     a. Post-op afib after CABG 08/2012.  . Valvular heart disease     a. Mild  MR by TEE 08/2012.  [name] Prostatic hypertrophy     a. Hx of urinary retention, awaiting TURP.   Past Surgical History  Procedure Date  . Back surgery   . Appendectomy   . Coronary artery bypass graft 08/16/2012    Procedure: CORONARY ARTERY BYPASS GRAFTING (CABG);  Surgeon: Tanner Slot, MD;  Location: [facility] OR;  Service: Open Heart Surgery;  Laterality: N/A;    Most Recent Cardiac Studies: 2D Echo 08/15/12 Study Conclusions - Left ventricle: Wall thickness was increased in a pattern   of mild LVH. Systolic function was normal. The estimated   ejection fraction was in the range of 60% to 65%. - Mitral valve: Mild to moderate regurgitation. -  Left atrium: The atrium was moderately dilated.  - Cath prior to CABG   Allergies No Known Allergies  Home Medications Current Outpatient Prescriptions  Medication Sig Dispense Refill  . amiodarone (PACERONE) 200 MG tablet Take 1 tablet (200 mg total) by mouth daily.  30 tablet  2  . aspirin EC 325 MG EC tablet Take 1 tablet (325 mg total) by mouth daily.      [name] atorvastatin (LIPITOR) 10 MG tablet Take 8 tablets (80 mg total) by mouth at bedtime.  30 tablet  1  . insulin glargine (LANTUS) 100 UNIT/ML injection Inject 40 Units into the  skin at bedtime.  10 mL  0  . levofloxacin (LEVAQUIN) 500 MG tablet Take 500 mg by mouth daily.      . metFORMIN (GLUCOPHAGE-XR) 500 MG 24 hr tablet Take 1,000 mg by mouth 2 (two) times daily.       . metoprolol tartrate (LOPRESSOR) 12.5 mg TABS Take 0.5 tablets (12.5 mg total) by mouth 2 (two) times daily.  30 each  1  . oxyCODONE-acetaminophen (PERCOCET) 7.5-325 MG per tablet Take 1 tablet by mouth every 6 (six) hours as needed.      [name] RELION INSULIN SYRINGE 1ML/31G 31G X 5/16" 1 ML MISC 1 each by Other route as needed.       . Tamsulosin HCl (FLOMAX) 0.4 MG CAPS Take 0.4 mg by mouth daily after supper.      . traMADol (ULTRAM) 50 MG tablet Take 50 mg by mouth every 6 (six) hours as needed.       [name] witch hazel-glycerin (TUCKS) pad Apply topically as needed.  40 each    . zolpidem (AMBIEN) 10 MG tablet Take 1 tablet (10 mg total) by mouth at bedtime as needed for sleep.  30 tablet  3    History   Social History  . Marital Status: Married    Spouse Name: N/A    Number of Children: N/A  . Years of Education: N/A   Occupational History  . Not on file.   Social History Main Topics  . Smoking status: Former Smoker    Quit date: 11/12/1972  . Smokeless tobacco: Never Used  . Alcohol Use: No  . Drug Use: No  . Sexually Active: No   Other Topics Concern  . Not on file   Social History Narrative  . No narrative on file    Family Hx: noncontributory for premature CAD  Review of Systems:  General: negative for chills, fever, night sweats Cardiovascular: see above Dermatological: negative for new rash Respiratory: negative for cough or wheezing Urologic: negative for hematuria Abdominal: he has occasional vomiting but no nausea. No diarrhea, bright red blood per rectum, melena, or hematemesis. Appetite hasn't been so great since cabg Neurologic: negative for visual changes, syncope All other systems reviewed and are otherwise negative except as noted above.  Physical  Exam: Blood pressure 114/75, pulse 57, height 5\' 9"  (1.753 m), weight 179 lb (81.194 kg). Wt Readings from Last 3 Encounters:  09/28/12 179 lb (81.194 kg)  09/18/12 178 lb (80.74 kg)  09/03/12 186 lb 12.8 oz (84.732 kg)    General: Well developed, well nourished WM in no acute distress. Head: Normocephalic, atraumatic, sclera non-icteric, no xanthomas, nares are without discharge.  Neck: Negative for carotid bruits. JVD not elevated. Lungs: Clear bilaterally to auscultation without wheezes, rales, or rhonchi. Breathing is unlabored. Heart: RRR with S1 S2. No murmurs, rubs, or gallops appreciated. Abdomen: Soft, non-tender,  non-distended with normoactive bowel sounds. No hepatomegaly. No rebound/guarding. No obvious abdominal masses. Msk:  Strength and tone appear normal for age. Extremities: No clubbing or cyanosis. No edema.  Distal pedal pulses are 2+ and equal bilaterally. Neuro: Alert and oriented X 3. No facial asymmetry. No focal deficit. Moves all extremities spontaneously. Psych:  Responds to questions appropriately with a normal affect.   Accessory Clinical Findings:  Last labs while in the hospital:   Ref. Range 08/26/2012 07:36  Sodium Latest Range: 135-145 mEq/L 135  Potassium Latest Range: 3.5-5.1 mEq/L 4.6  Chloride Latest Range: 96-112 mEq/L 97  CO2 Latest Range: 19-32 mEq/L 32  BUN Latest Range: 6-23 mg/dL 19  Creatinine Latest Range: 0.50-1.35 mg/dL 0.98  Calcium Latest Range: 8.4-10.5 mg/dL 11.9  GFR calc non Af Amer Latest Range: >90 mL/min 54 (L)  GFR calc Af Amer Latest Range: >90 mL/min 62 (L)  Glucose Latest Range: 70-99 mg/dL 95   Results for JAMERE, STIDHAM (MRN [id number]) as of 09/28/2012 13:11  Ref. Range 08/23/2012 04:20  WBC Latest Range: 4.0-10.5 K/uL 11.3 (H)  RBC Latest Range: 4.22-5.81 MIL/uL 4.20 (L)  Hemoglobin Latest Range: 13.0-17.0 g/dL 13.0 (L)  HCT Latest Range: 39.0-52.0 % 38.1 (L)  MCV Latest Range: 78.0-100.0 fL 90.7  MCH Latest Range:  26.0-34.0 pg 29.5  MCHC Latest Range: 30.0-36.0 g/dL 86.5  RDW Latest Range: 11.5-15.5 % 13.4  Platelets Latest Range: 150-400 K/uL 305   EKG - sinus bradycardia 56bpm prior inf/ant infarct with nonspecific ST-T changes, no acute changes from prior tracing  Assessment & Plan: Patient was seen and examined with myself and Dr. Myrtis Ser. We extensively reviewed prior records including OV notes and discharge summary.  1. Hypotension - I suspect this was an inaccurate BP reading. Pt denies any recent low readings at home and is normotensive today. Continue low-dose metoprolol. Instructed him to check his BP regularly, i.e. 3x/week to make sure his values stay normal. Since he hasn't had recent labwork, will go ahead and check CBC and CMET. 2. Post-op atrial fibrillation - maintaining NSR. No evidence to suggest he is having this outside the hospital. Per discussion with Dr. Myrtis Ser, we will stop amiodarone since this was planned in upcoming weeks anyway. Given episode of vomiting (unclear etiology), will add on CMET for today. Continue BB. 3. CAD s/p CABG - stable. 4. Prostatic hypertrophy - notes reviewed from prior OV with [name] and [name]. Agree with ability to stop ASA 5 days prior to prostate surgery now that he is 6 weeks out from CABG. The patient was instructed that we will send a copy of this note to his urologist and he should wait to hear from his urologist about when he should exactly stop taking this once the date is planned. His aspirin should be resumed as soon as possible post-operatively.  Will keep f/u appointment with [name] in November as previously scheduled.  Ronie Spies, PA-C 09/28/2012, 1:09 PM

## 2012-10-08 ENCOUNTER — Telehealth: Payer: Self-pay | Admitting: *Deleted

## 2012-10-08 NOTE — Telephone Encounter (Signed)
Message copied by Eustace Moore on Mon Oct 08, 2012  1:33 PM ------      Message from: Laurann Montana      Created: Wed Oct 03, 2012  2:38 PM       Please call patient and inform him: Renal function is slightly bumped and with history of diabetes and slight protein in urine by last lab check, he needs to f/u PCP for this. Albumin is also low and should be followed by primary care especially if he has had poor appetite and occasional vomiting because it can be a marker of his nutritional status. Otherwise labs stable. F/u with Dr. Antoine Poche as directed.

## 2012-10-08 NOTE — Telephone Encounter (Signed)
Patient informed and copy sent to PCP. 

## 2012-10-22 ENCOUNTER — Ambulatory Visit (INDEPENDENT_AMBULATORY_CARE_PROVIDER_SITE_OTHER): Payer: Medicare Other | Admitting: Cardiology

## 2012-10-22 ENCOUNTER — Encounter: Payer: Self-pay | Admitting: Cardiology

## 2012-10-22 VITALS — BP 130/85 | HR 78 | Ht 69.0 in | Wt 176.1 lb

## 2012-10-22 DIAGNOSIS — I2510 Atherosclerotic heart disease of native coronary artery without angina pectoris: Secondary | ICD-10-CM

## 2012-10-22 DIAGNOSIS — N400 Enlarged prostate without lower urinary tract symptoms: Secondary | ICD-10-CM

## 2012-10-22 DIAGNOSIS — I214 Non-ST elevation (NSTEMI) myocardial infarction: Secondary | ICD-10-CM

## 2012-10-22 DIAGNOSIS — I4891 Unspecified atrial fibrillation: Secondary | ICD-10-CM

## 2012-10-22 DIAGNOSIS — I10 Essential (primary) hypertension: Secondary | ICD-10-CM

## 2012-10-22 DIAGNOSIS — I739 Peripheral vascular disease, unspecified: Secondary | ICD-10-CM

## 2012-10-22 NOTE — Patient Instructions (Addendum)

## 2012-10-22 NOTE — Progress Notes (Signed)
HPI The patient presents for followup after CABG. he seems to finally be on them and after having problems with prostatic hypertrophy and requiring an indwelling Foley catheter. He had infections related to this surgery had been delayed until he recovered from bypass. He's had his TURP in his catheter is not.  The patient denies any new symptoms such as chest discomfort, neck or arm discomfort. There has been no new shortness of breath, PND or orthopnea. There have been no reported palpitations, presyncope or syncope.  No Known Allergies  Current Outpatient Prescriptions  Medication Sig Dispense Refill  . aspirin EC 325 MG EC tablet Take 1 tablet (325 mg total) by mouth daily.      [name] atorvastatin (LIPITOR) 10 MG tablet Take 8 tablets (80 mg total) by mouth at bedtime.  30 tablet  1  . insulin glargine (LANTUS) 100 UNIT/ML injection Inject 40 Units into the skin at bedtime.  10 mL  0  . metFORMIN (GLUCOPHAGE-XR) 500 MG 24 hr tablet Take 1,000 mg by mouth 2 (two) times daily.       [name] oxyCODONE-acetaminophen (PERCOCET) 7.5-325 MG per tablet Take 1 tablet by mouth every 6 (six) hours as needed.      [name] RELION INSULIN SYRINGE 1ML/31G 31G X 5/16" 1 ML MISC 1 each by Other route as needed.       . zolpidem (AMBIEN) 10 MG tablet Take 5 mg by mouth at bedtime as needed.      . [DISCONTINUED] zolpidem (AMBIEN) 10 MG tablet Take 1 tablet (10 mg total) by mouth at bedtime as needed for sleep.  30 tablet  3    Past Medical History  Diagnosis Date  . CAD (coronary artery disease)     a. NSTEMI s/p stent to distal RCA 03/2000. b. Inf MI s/p emergent thrombectomy/stenting mid RCA 10/2000. c. NSTEMI s/p CABGx4 (LIMA-LAD, SVG-OM2, seq SVG-acute marginal and PD) 08/16/12 - course complicated by confusion, post-op AF, L pleural effusion with thoracentesis. d. Normal LV function by echo 08/2012.  [name] HTN (hypertension)   . HLD (hyperlipidemia)   . GERD (gastroesophageal reflux disease)   . Hiatal hernia   .  Prostate cancer     Status post radiation treatment.  . Tobacco abuse   . Diabetes mellitus   . Peripheral vascular disease     a. Carotid dopplers neg 08/13/12. b. Pre-cabg ABIs - R=0.87 suggesting mild dz, L=1.29 possibly falsely elevated due to calcified vessels.  . Pleural effusion     a. L pleural eff after CABG s/p thoracentesis 08/22/12.  [name] A-fib     a. Post-op afib after CABG 08/2012.  . Valvular heart disease     a. Mild  MR by TEE 08/2012.  [name] Prostatic hypertrophy     a. Hx of urinary retention, awaiting TURP.    Past Surgical History  Procedure Date  . Back surgery   . Appendectomy   . Coronary artery bypass graft 08/16/2012    Procedure: CORONARY ARTERY BYPASS GRAFTING (CABG);  Surgeon: Loreli Slot, MD;  Location: [facility] OR;  Service: Open Heart Surgery;  Laterality: N/A;    ROS:  As stated in the HPI and negative for all other systems.  PHYSICAL EXAM BP 130/85  Pulse 78  Ht 5\' 9"  (1.753 m)  Wt 176 lb 1.9 oz (79.888 kg)  BMI 26.01 kg/m2  SpO2 98% GENERAL:  Well appearing NECK:  No jugular venous distention, waveform within normal limits, carotid upstroke brisk and  symmetric, no bruits, no thyromegaly LYMPHATICS:  No cervical, inguinal adenopathy LUNGS:  Clear to auscultation bilaterally BACK:  No CVA tenderness CHEST:  Well healed sternotomy scar. HEART:  PMI not displaced or sustained,S1 and S2 within normal limits, no S3, no S4, no clicks, no rubs, no murmurs ABD:  Flat, positive bowel sounds normal in frequency in pitch, no bruits, no rebound, no guarding, no midline pulsatile mass, no hepatomegaly, no splenomegaly EXT:  2 plus pulses throughout, no edema, no cyanosis no clubbing  ASSESSMENT AND PLAN  CAD/CABG - He has had no new symptoms and he will continue with secondary risk reduction.  He is going to participate in cardiac rehab.  Atrial fibrillation - He is off his amiodarone and has had no recurrence of this.  Prostatic hypertrophy - He is s/p  TURP.    Insomnia - I will takes Ambien infrequently.  Hyperlipidemia - I will defer to Selinda Flavin, MD with a goal LDL less than 70 and HDL greater than 40.

## 2012-10-24 ENCOUNTER — Other Ambulatory Visit: Payer: Self-pay | Admitting: Surgical

## 2012-10-26 ENCOUNTER — Other Ambulatory Visit: Payer: Self-pay | Admitting: *Deleted

## 2012-10-26 MED ORDER — ATORVASTATIN CALCIUM 80 MG PO TABS: 80.0000 mg | ORAL_TABLET | Freq: Every day | ORAL | Status: DC

## 2012-12-20 ENCOUNTER — Telehealth: Payer: Self-pay | Admitting: Cardiology

## 2012-12-20 NOTE — Telephone Encounter (Signed)
Patient informed. 

## 2012-12-20 NOTE — Telephone Encounter (Signed)
Spoke with patient and he is requesting medication for his heart burns. Patient denies having chest pain, dizziness or shortness of breath. Patient said its not constant indigestion and he has taken medication in the past which included OTC types.

## 2012-12-20 NOTE — Telephone Encounter (Signed)
WOULD LIKE RX FOR HEARTBURN  [facility] IN [place]

## 2012-12-20 NOTE — Telephone Encounter (Signed)
Recommend pt f/u with his primary MD for Rx of GERD

## 2013-01-03 ENCOUNTER — Encounter: Payer: Self-pay | Admitting: Physician Assistant

## 2013-01-03 ENCOUNTER — Ambulatory Visit (INDEPENDENT_AMBULATORY_CARE_PROVIDER_SITE_OTHER): Payer: 59 | Admitting: Physician Assistant

## 2013-01-03 VITALS — BP 168/101 | HR 71 | Ht 69.0 in | Wt 192.4 lb

## 2013-01-03 DIAGNOSIS — I2510 Atherosclerotic heart disease of native coronary artery without angina pectoris: Secondary | ICD-10-CM

## 2013-01-03 DIAGNOSIS — I10 Essential (primary) hypertension: Secondary | ICD-10-CM

## 2013-01-03 MED ORDER — LISINOPRIL 10 MG PO TABS: 10.0000 mg | ORAL_TABLET | Freq: Every day | ORAL | Status: DC

## 2013-01-03 NOTE — Assessment & Plan Note (Addendum)
Given his history of DM, I have elected to start him on an ACE inhibitor with lisinopril 10 mg daily. I recommended that he followup with his primary M.D. in 2 or 3 weeks, for continued monitoring and adjustment of his medication dose, if needed. I will check a followup BMET in one week, for close monitoring of potassium and renal function. He may resume cardiac rehabilitation, once his BP has improved. In light of most recent guidelines, recommendation would be to try to maintain a BP of less than 150/90, given his age.

## 2013-01-03 NOTE — Patient Instructions (Signed)
   Begin Lisinopril 10mg  daily Continue all other current medications. Follow up with primary MD in 2-3 weeks Follow up with Dr. Antoine Poche as previously discussed at last office visit

## 2013-01-03 NOTE — Assessment & Plan Note (Signed)
Follow with Dr. Antoine Poche, as previously scheduled.

## 2013-01-03 NOTE — Progress Notes (Signed)
Primary Cardiologist: Rollene Rotunda, MD   HPI: Patient presents as add-on from cardiac rehabilitation, for evaluation of elevated BP. He had an initial BP of 160/102, with a repeat diastolic of 118.  Patient denies any known history of HTN, and is currently not on any antihypertensive medication. He denies any recent development of headache or visual changes. He reports no CP, dyspnea, or tachycardia palpitations.  No Known Allergies  Current Outpatient Prescriptions  Medication Sig Dispense Refill  . aspirin EC 325 MG EC tablet Take 1 tablet (325 mg total) by mouth daily.      [name] atorvastatin (LIPITOR) 80 MG tablet Take 1 tablet (80 mg total) by mouth daily.  90 tablet  3  . insulin glargine (LANTUS) 100 UNIT/ML injection Inject 40 Units into the skin at bedtime.  10 mL  0  . metFORMIN (GLUCOPHAGE-XR) 500 MG 24 hr tablet Take 1,000 mg by mouth 2 (two) times daily.       . ranitidine (ZANTAC) 150 MG tablet Take 150 mg by mouth daily.      . solifenacin (VESICARE) 10 MG tablet Take 10 mg by mouth daily.      [name] lisinopril (PRINIVIL,ZESTRIL) 10 MG tablet Take 1 tablet (10 mg total) by mouth daily.  30 tablet  6    Past Medical History  Diagnosis Date  . CAD (coronary artery disease)     a. NSTEMI s/p stent to distal RCA 03/2000. b. Inf MI s/p emergent thrombectomy/stenting mid RCA 10/2000. c. NSTEMI s/p CABGx4 (LIMA-LAD, SVG-OM2, seq SVG-acute marginal and PD) 08/16/12 - course complicated by confusion, post-op AF, L pleural effusion with thoracentesis. d. Normal LV function by echo 08/2012.  [name] HTN (hypertension)   . HLD (hyperlipidemia)   . GERD (gastroesophageal reflux disease)   . Hiatal hernia   . Prostate cancer     Status post radiation treatment.  . Tobacco abuse   . Diabetes mellitus   . Peripheral vascular disease     a. Carotid dopplers neg 08/13/12. b. Pre-cabg ABIs - R=0.87 suggesting mild dz, L=1.29 possibly falsely elevated due to calcified vessels.  . Pleural effusion     a.  L pleural eff after CABG s/p thoracentesis 08/22/12.  [name] A-fib     a. Post-op afib after CABG 08/2012.  . Valvular heart disease     a. Mild  MR by TEE 08/2012.  [name] Prostatic hypertrophy     a. Hx of urinary retention, awaiting TURP.    Past Surgical History  Procedure Date  . Back surgery   . Appendectomy   . Coronary artery bypass graft 08/16/2012    Procedure: CORONARY ARTERY BYPASS GRAFTING (CABG);  Surgeon: Loreli Slot, MD;  Location: [facility] OR;  Service: Open Heart Surgery;  Laterality: N/A;    History   Social History  . Marital Status: Married    Spouse Name: N/A    Number of Children: N/A  . Years of Education: N/A   Occupational History  . Not on file.   Social History Main Topics  . Smoking status: Former Smoker    Quit date: 11/12/1972  . Smokeless tobacco: Never Used  . Alcohol Use: No  . Drug Use: No  . Sexually Active: No   Other Topics Concern  . Not on file   Social History Narrative  . No narrative on file    No family history on file.  ROS: no nausea, vomiting; no fever, chills; no melena, hematochezia; no claudication  PHYSICAL  EXAM: BP 168/101  Pulse 71  Ht 5\' 9"  (1.753 m)  Wt 192 lb 6.4 oz (87.272 kg)  BMI 28.41 kg/m2  SpO2 98% GENERAL: 67 year-old male; NAD HEENT: NCAT, PERRLA, EOMI; sclera clear; no xanthelasma NECK: palpable bilateral carotid pulses, no bruits; no JVD; no TM LUNGS: CTA bilaterally CARDIAC: RRR (S1, S2); no significant murmurs; no rubs or gallops ABDOMEN: soft, non-tender; intact BS EXTREMETIES: no significant peripheral edema SKIN: warm/dry; no obvious rash/lesions MUSCULOSKELETAL: no joint deformity NEURO: no focal deficit; NL affect   EKG:    ASSESSMENT & PLAN:  HTN (hypertension) Given his history of DM, I have elected to start him on an ACE inhibitor with lisinopril 10 mg daily. I recommended that he followup with his primary M.D. in 2 or 3 weeks, for continued monitoring and adjustment of his  medication dose, if needed. I will check a followup BMET in one week, for close monitoring of potassium and renal function. He may resume cardiac rehabilitation, once his BP has improved. In light of most recent guidelines, recommendation would be to try to maintain a BP of less than 150/90, given his age.  CAD (coronary artery disease) Follow with Dr. Antoine Poche, as previously scheduled.    Gene Silvestre Mines, PAC

## 2013-02-22 ENCOUNTER — Encounter: Payer: Self-pay | Admitting: Cardiology

## 2013-04-26 ENCOUNTER — Ambulatory Visit: Payer: 59 | Admitting: Cardiology

## 2013-05-01 ENCOUNTER — Encounter: Payer: Self-pay | Admitting: Cardiology

## 2013-05-01 ENCOUNTER — Ambulatory Visit (INDEPENDENT_AMBULATORY_CARE_PROVIDER_SITE_OTHER): Payer: 59 | Admitting: Cardiology

## 2013-05-01 VITALS — BP 152/89 | HR 78 | Ht 69.0 in | Wt 197.1 lb

## 2013-05-01 DIAGNOSIS — I2510 Atherosclerotic heart disease of native coronary artery without angina pectoris: Secondary | ICD-10-CM

## 2013-05-01 DIAGNOSIS — I10 Essential (primary) hypertension: Secondary | ICD-10-CM

## 2013-05-01 DIAGNOSIS — I4891 Unspecified atrial fibrillation: Secondary | ICD-10-CM

## 2013-05-01 DIAGNOSIS — Z79899 Other long term (current) drug therapy: Secondary | ICD-10-CM

## 2013-05-01 MED ORDER — LISINOPRIL 10 MG PO TABS: 10.0000 mg | ORAL_TABLET | Freq: Two times a day (BID) | ORAL | Status: DC

## 2013-05-01 MED ORDER — ASPIRIN EC 81 MG PO TBEC: 81.0000 mg | DELAYED_RELEASE_TABLET | Freq: Every day | ORAL | Status: DC

## 2013-05-01 NOTE — Patient Instructions (Addendum)
   Increase Lisinopril to 10mg  twice a day    Decrease Aspirin to 81mg  daily Continue all other current medications. Lab for BMET in 2 weeks (around June 6) Office will contact with results Your physician wants you to follow up in: 6 months.  You will receive a reminder letter in the mail one-two months in advance.  If you don't receive a letter, please call our office to schedule the follow up appointment

## 2013-05-01 NOTE — Progress Notes (Signed)
HPI The patient presents for followup after CABG.  The patient denies any new symptoms such as chest discomfort, neck or arm discomfort. There has been no new shortness of breath, PND or orthopnea. There have been no reported palpitations, presyncope or syncope.  He completed cardiac rehab.  He is now active on the farm. He has had a little discomfort in his right leg where he had vein harvesting. He still having a little trouble with urinary incontinence following his TURP.  No Known Allergies  Current Outpatient Prescriptions  Medication Sig Dispense Refill  . aspirin EC 325 MG EC tablet Take 1 tablet (325 mg total) by mouth daily.      [name] atorvastatin (LIPITOR) 80 MG tablet Take 1 tablet (80 mg total) by mouth daily.  90 tablet  3  . insulin glargine (LANTUS) 100 UNIT/ML injection Inject 40 Units into the skin at bedtime.  10 mL  0  . lisinopril (PRINIVIL,ZESTRIL) 10 MG tablet Take 1 tablet (10 mg total) by mouth daily.  30 tablet  6  . metFORMIN (GLUCOPHAGE-XR) 500 MG 24 hr tablet Take 1,000 mg by mouth 2 (two) times daily.       . ranitidine (ZANTAC) 150 MG tablet Take 150 mg by mouth daily.       No current facility-administered medications for this visit.    Past Medical History  Diagnosis Date  . CAD (coronary artery disease)     a. NSTEMI s/p stent to distal RCA 03/2000. b. Inf MI s/p emergent thrombectomy/stenting mid RCA 10/2000. c. NSTEMI s/p CABGx4 (LIMA-LAD, SVG-OM2, seq SVG-acute marginal and PD) 08/16/12 - course complicated by confusion, post-op AF, L pleural effusion with thoracentesis. d. Normal LV function by echo 08/2012.  [name] HTN (hypertension)   . HLD (hyperlipidemia)   . GERD (gastroesophageal reflux disease)   . Hiatal hernia   . Prostate cancer     Status post radiation treatment.  . Tobacco abuse   . Diabetes mellitus   . Peripheral vascular disease     a. Carotid dopplers neg 08/13/12. b. Pre-cabg ABIs - R=0.87 suggesting mild dz, L=1.29 possibly falsely elevated  due to calcified vessels.  . Pleural effusion     a. L pleural eff after CABG s/p thoracentesis 08/22/12.  [name] A-fib     a. Post-op afib after CABG 08/2012.  . Valvular heart disease     a. Mild  MR by TEE 08/2012.  [name] Prostatic hypertrophy     a. Hx of urinary retention, awaiting TURP.    Past Surgical History  Procedure Laterality Date  . Back surgery    . Appendectomy    . Coronary artery bypass graft  08/16/2012    Procedure: CORONARY ARTERY BYPASS GRAFTING (CABG);  Surgeon: Loreli Slot, MD;  Location: [facility] OR;  Service: Open Heart Surgery;  Laterality: N/A;    ROS:  As stated in the HPI and negative for all other systems.  PHYSICAL EXAM BP 152/89  Pulse 78  Ht 5\' 9"  (1.753 m)  Wt 197 lb 1.9 oz (89.413 kg)  BMI 29.1 kg/m2 GENERAL:  Well appearing NECK:  No jugular venous distention, waveform within normal limits, carotid upstroke brisk and symmetric, no bruits, no thyromegaly LYMPHATICS:  No cervical, inguinal adenopathy LUNGS:  Clear to auscultation bilaterally BACK:  No CVA tenderness CHEST:  Well healed sternotomy scar. HEART:  PMI not displaced or sustained,S1 and S2 within normal limits, no S3, no S4, no clicks, no rubs, no murmurs ABD:  Flat, positive bowel sounds normal in frequency in pitch, no bruits, no rebound, no guarding, no midline pulsatile mass, no hepatomegaly, no splenomegaly EXT:  2 plus pulses throughout, mild right ankle edema, no cyanosis no clubbing  ASSESSMENT AND PLAN  CAD/CABG - He has had no new symptoms and he will continue with secondary risk reduction.   Now that he is not doing cardiac rehabilitation we did discuss the need for continued exercise.  Atrial fibrillation - This was postop. No further therapy is indicated.  Hypertension - His BP has been elevated at the last couple of appointments.  I'm going to increase his lisinopril to 10 mg twice daily. Because he is potassium was upper limits of normal and creatinine mildly elevated I  will make sure to check a basic metabolic profile in 10 days.  Hyperlipidemia - I will defer to Selinda Flavin, MD

## 2013-05-07 NOTE — Progress Notes (Signed)
Due June 4!

## 2013-05-21 ENCOUNTER — Telehealth: Payer: Self-pay | Admitting: *Deleted

## 2013-05-21 NOTE — Telephone Encounter (Signed)
Patient informed. 

## 2013-05-21 NOTE — Telephone Encounter (Signed)
Message copied by Eustace Moore on Tue May 21, 2013  9:50 AM ------      Message from: Rollene Rotunda      Created: Thu May 16, 2013  5:16 PM       Labs OK.  Call Mr. Lenzo with the results and send results to Selinda Flavin, MD       ------

## 2013-10-28 ENCOUNTER — Ambulatory Visit (INDEPENDENT_AMBULATORY_CARE_PROVIDER_SITE_OTHER): Payer: 59 | Admitting: Cardiology

## 2013-10-28 ENCOUNTER — Encounter: Payer: Self-pay | Admitting: Cardiology

## 2013-10-28 VITALS — BP 158/97 | HR 71 | Ht 69.0 in | Wt 205.0 lb

## 2013-10-28 DIAGNOSIS — I10 Essential (primary) hypertension: Secondary | ICD-10-CM

## 2013-10-28 DIAGNOSIS — I4891 Unspecified atrial fibrillation: Secondary | ICD-10-CM

## 2013-10-28 DIAGNOSIS — I2510 Atherosclerotic heart disease of native coronary artery without angina pectoris: Secondary | ICD-10-CM

## 2013-10-28 DIAGNOSIS — E785 Hyperlipidemia, unspecified: Secondary | ICD-10-CM

## 2013-10-28 MED ORDER — LISINOPRIL 40 MG PO TABS: 40.0000 mg | ORAL_TABLET | Freq: Every day | ORAL | Status: DC

## 2013-10-28 NOTE — Patient Instructions (Signed)
Your physician recommends that you schedule a follow-up appointment in: 4 months with Dr. Wyline Mood. This appointment will be scheduled today before you leave.  Your physician has recommended you make the following change in your medication:  Increase Lisinopril 40 MG once daily.  Continue all other medications the same.   Your physician recommends that you return for lab work in: 1 Month (Around 11-27-2013) for BMET  You can go to the following locations to get lab work done: [facility] [address] Dr. Dimas Aguas office Dr. Lysbeth Galas office in [place] Or [facility].

## 2013-10-28 NOTE — Progress Notes (Addendum)
Clinical Summary [name] is a 67 y.o.male seen today for follow up, he was last seen by Dr Antoine Poche. This is his first appointment with me.   1. CAD - prior CABG 08/2012, normal LV function 60-65%.  - no chest pain, no SOB, or DOE. 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 - compliant with medications: ASA, lipitor, lisinopril  2. HTN - doesn't check regularly - compliant with meds  3. Hyperlipidemia - compliant with statin, typically checked regularly by hi PCP Dr Dimas Aguas.  - 08/2012 TC 141 TG 151 HDL 33 LDL 78    Past Medical History  Diagnosis Date  . CAD (coronary artery disease)     a. NSTEMI s/p stent to distal RCA 03/2000. b. Inf MI s/p emergent thrombectomy/stenting mid RCA 10/2000. c. NSTEMI s/p CABGx4 (LIMA-LAD, SVG-OM2, seq SVG-acute marginal and PD) 08/16/12 - course complicated by confusion, post-op AF, L pleural effusion with thoracentesis. d. Normal LV function by echo 08/2012.  [name] HTN (hypertension)   . HLD (hyperlipidemia)   . GERD (gastroesophageal reflux disease)   . Hiatal hernia   . Prostate cancer     Status post radiation treatment.  . Tobacco abuse   . Diabetes mellitus   . Peripheral vascular disease     a. Carotid dopplers neg 08/13/12. b. Pre-cabg ABIs - R=0.87 suggesting mild dz, L=1.29 possibly falsely elevated due to calcified vessels.  . Pleural effusion     a. L pleural eff after CABG s/p thoracentesis 08/22/12.  [name] A-fib     a. Post-op afib after CABG 08/2012.  . Valvular heart disease     a. Mild  MR by TEE 08/2012.  [name] Prostatic hypertrophy     a. Hx of urinary retention, awaiting TURP.     No Known Allergies   Current Outpatient Prescriptions  Medication Sig Dispense Refill  . aspirin EC 81 MG tablet Take 1 tablet (81 mg total) by mouth daily.      [name] atorvastatin (LIPITOR) 80 MG tablet Take 1 tablet (80 mg total) by mouth daily.  90 tablet  3  . insulin glargine (LANTUS) 100 UNIT/ML injection Inject 40 Units into  the skin at bedtime.  10 mL  0  . lisinopril (PRINIVIL,ZESTRIL) 10 MG tablet Take 1 tablet (10 mg total) by mouth 2 (two) times daily.  60 tablet  6  . metFORMIN (GLUCOPHAGE-XR) 500 MG 24 hr tablet Take 1,000 mg by mouth 2 (two) times daily.       . ranitidine (ZANTAC) 150 MG tablet Take 150 mg by mouth daily.       No current facility-administered medications for this visit.     Past Surgical History  Procedure Laterality Date  . Back surgery    . Appendectomy    . Coronary artery bypass graft  08/16/2012    Procedure: CORONARY ARTERY BYPASS GRAFTING (CABG);  Surgeon: Loreli Slot, MD;  Location: [facility] OR;  Service: Open Heart Surgery;  Laterality: N/A;     No Known Allergies    No family history on file.   Social History Mr. Vangilder reports that he quit smoking about 40 years ago. He has never used smokeless tobacco. Mr. Dom reports that he does not drink alcohol.   Review of Systems CONSTITUTIONAL: No weight loss, fever, chills, weakness or fatigue.  HEENT: Eyes: No visual loss, blurred vision, double vision or yellow sclerae.No hearing loss, sneezing, congestion, runny nose or sore throat.  SKIN: No rash or itching.  CARDIOVASCULAR: per HPI RESPIRATORY: per HPI  GASTROINTESTINAL: No anorexia, nausea, vomiting or diarrhea. No abdominal pain or blood.  GENITOURINARY: No burning on urination, no polyuria NEUROLOGICAL: No headache, dizziness, syncope, paralysis, ataxia, numbness or tingling in the extremities. No change in bowel or bladder control.  MUSCULOSKELETAL: No muscle, back pain, joint pain or stiffness.  LYMPHATICS: No enlarged nodes. No history of splenectomy.  PSYCHIATRIC: No history of depression or anxiety.  ENDOCRINOLOGIC: No reports of sweating, cold or heat intolerance. No polyuria or polydipsia.  [name]   Physical Examination There were no vitals filed for this visit. Filed Weights   10/28/13 0805  Weight: 205 lb (92.987 kg)   BP 150/90 p 71 Wt 205  lbs BMI 30 Gen: resting comfortably, no acute distress HEENT: no scleral icterus, pupils equal round and reactive, no palptable cervical adenopathy,  CV: RRR, no m/r/g, no JVD, no carotid bruits Resp: Clear to auscultation bilaterally GI: abdomen is soft, non-tender, non-distended, normal bowel sounds, no hepatosplenomegaly MSK: extremities are warm, no edema.  Skin: warm, no rash Neuro:  no focal deficits Psych: appropriate affect   Diagnostic Studies 08/2012 Echo: LVEF 55-60%, mild MR.  10/28/13 Clinic EKG: sinus rhythm, LAD, Q waves inferior elads   Assessment and Plan  1. CAD - no current symptoms - continue current medications and risk factor modifications.  2. HTN - not at goal - will increase lisinopril to 40mg  daily, check BMET in 4 weeks. Goal bp given history of DM is <130/80  3. Hyperlipidemia - will request if recent panel from PCP, he was slightly above goal from labs last year.   Follow up 4 months   Antoine Poche, M.D., F.A.C.C.

## 2013-10-31 ENCOUNTER — Telehealth: Payer: Self-pay | Admitting: Cardiology

## 2013-10-31 NOTE — Telephone Encounter (Signed)
Did they check his blood sugar? Ask them to check his blood pressure 2-3 times a day after resting for 5 minutes and report back to [place] Monday.    Dina Rich

## 2013-10-31 NOTE — Telephone Encounter (Signed)
Wife informed and states she will check his BS and BP as suggested, and call us back with results on Monday.

## 2013-10-31 NOTE — Telephone Encounter (Signed)
Spoke with wife and she is concerned that the recent medications change, (increase of lisinopril to 40 mg) may be contributing to patient feeling very tired, lightheaded and a little confusion.  Wife said that while patient was on his way to [facility] this morning, he came back home due to symptoms described. Wife also informed nurse that this happened before the med change also. Nurse informed wife that without BP readings, we would not be able to determine if symptoms would be coming from recent med changes. Nurse informed wife to have patient check BP and call office back with readings for further instructions. Patient denies chest pain or sob.

## 2013-10-31 NOTE — Telephone Encounter (Signed)
Concerned about her husband and would like to know what she needs to instruct her husband to do   Husband tried getting up this morning to go to appointment and could not shake being groggy.  Wife is concerned because he had a medication change Monday with his visit

## 2013-11-26 ENCOUNTER — Other Ambulatory Visit: Payer: Self-pay | Admitting: Cardiology

## 2013-11-26 MED ORDER — ATORVASTATIN CALCIUM 80 MG PO TABS: 80.0000 mg | ORAL_TABLET | Freq: Every day | ORAL | Status: DC

## 2013-12-03 ENCOUNTER — Encounter: Payer: Self-pay | Admitting: Cardiology

## 2013-12-13 ENCOUNTER — Telehealth: Payer: Self-pay | Admitting: Cardiology

## 2013-12-13 NOTE — Telephone Encounter (Signed)
Pt informed me that he will be going one day next week to have lab work done.

## 2013-12-29 IMAGING — CR DG CHEST 2V
2 series · 2 of 2 positions shown · non-contrast
Comparison: Portable chest x-ray of 08/13/2012

CLINICAL DATA: Preop for CABG

CHEST - 2 VIEW

[w chest pa]
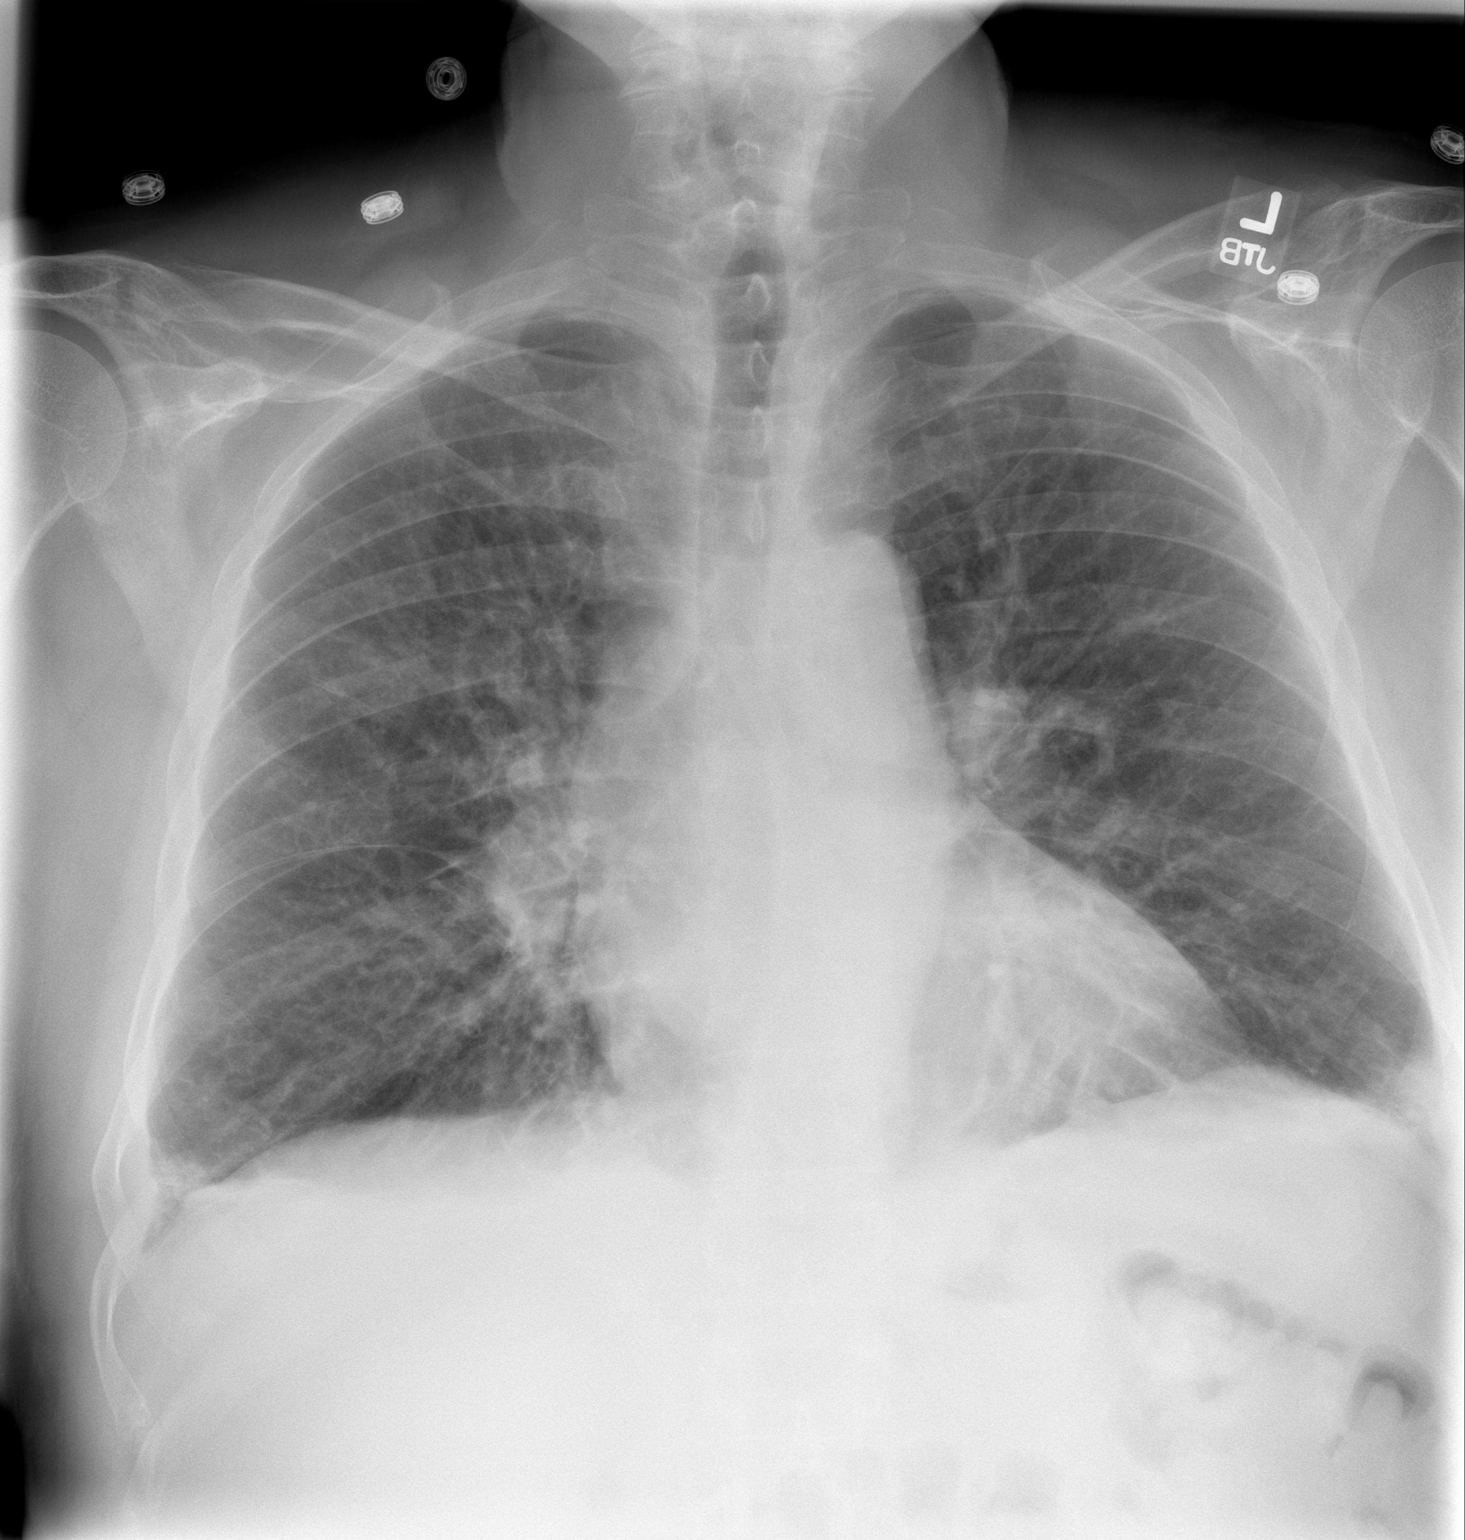

[w chest lat]
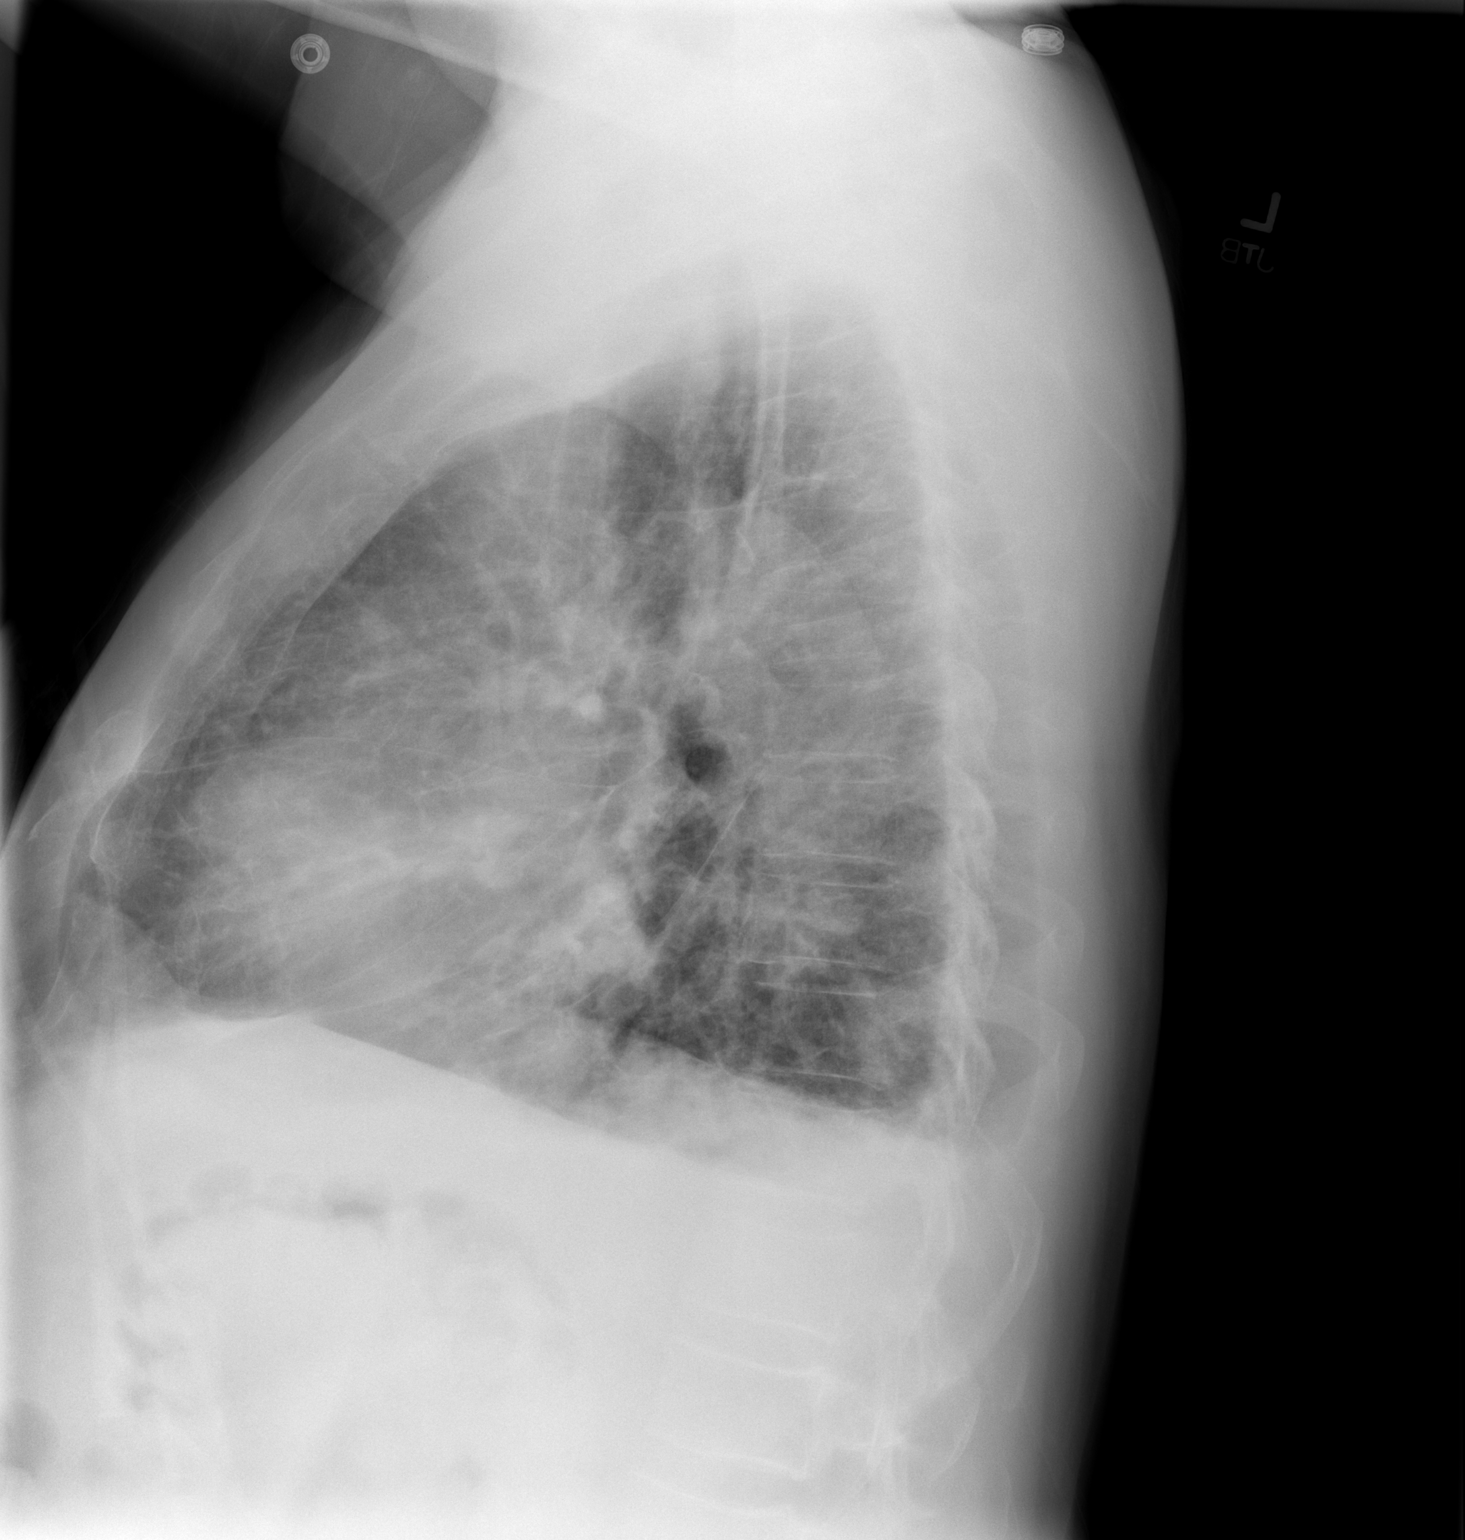

[2 of 2 positions shown; findings below may reference images not displayed]

FINDINGS: There is cardiomegaly present with moderate pulmonary
vascular congestion and small pleural effusions, consistent with
mild congestive heart failure.  No acute bony abnormality is seen.
IMPRESSION: Mild CHF with cardiomegaly, pulmonary vascular congestion, and
small pleural effusions.

## 2014-02-28 ENCOUNTER — Telehealth: Payer: Self-pay | Admitting: Cardiology

## 2014-02-28 NOTE — Telephone Encounter (Signed)
Message copied by Lewayne Bunting on Fri Feb 28, 2014 11:57 AM ------      Message from: [place] F      Created: Thu Feb 27, 2014 11:42 AM      Regarding: RE: lab - results       Please let pt know that labs look good            Carlyle Dolly MD      ----- Message -----         From: Lewayne Bunting, CMA         Sent: 02/26/2014   2:21 PM           To: Arnoldo Lenis, MD      Subject: lab - results                                            Pt lab results are in epic.            Thanks      Pamala Hurry        ------

## 2014-02-28 NOTE — Telephone Encounter (Signed)
Informed pt of results. Pt verbalized understanding. 

## 2014-03-03 ENCOUNTER — Ambulatory Visit: Payer: 59 | Admitting: Cardiology

## 2014-03-06 ENCOUNTER — Encounter: Payer: Self-pay | Admitting: Cardiology

## 2014-03-06 ENCOUNTER — Ambulatory Visit (INDEPENDENT_AMBULATORY_CARE_PROVIDER_SITE_OTHER): Payer: 59 | Admitting: Cardiology

## 2014-03-06 VITALS — BP 156/90 | HR 73 | Ht 69.0 in | Wt 200.8 lb

## 2014-03-06 DIAGNOSIS — I2510 Atherosclerotic heart disease of native coronary artery without angina pectoris: Secondary | ICD-10-CM

## 2014-03-06 DIAGNOSIS — E785 Hyperlipidemia, unspecified: Secondary | ICD-10-CM

## 2014-03-06 DIAGNOSIS — I10 Essential (primary) hypertension: Secondary | ICD-10-CM

## 2014-03-06 MED ORDER — LISINOPRIL 40 MG PO TABS: 40.0000 mg | ORAL_TABLET | Freq: Every day | ORAL | Status: DC

## 2014-03-06 MED ORDER — HYDROCHLOROTHIAZIDE 25 MG PO TABS: 25.0000 mg | ORAL_TABLET | Freq: Every day | ORAL | Status: DC

## 2014-03-06 NOTE — Patient Instructions (Addendum)
Your physician recommends that you schedule a follow-up appointment in: 6 months. You will receive a reminder letter in the mail in about 4 months reminding you to call and schedule your appointment. If you don't receive this letter, please contact our office. Your physician has recommended you make the following change in your medication:  Start hydrochlorothiazide 25 mg daily. Your new prescription has been sent to your pharmacy. All other medications will remain the same. Your physician recommends that you have lab work in 3 weeks around March 27, 2014 to check your BMET. Your provider would like for you to use the information given to you today to follow a DASH DIET.

## 2014-03-06 NOTE — Progress Notes (Signed)
Clinical Summary [name] is a 68 y.o.male seen today for follow up of the following medical problems.   1. CAD  - prior CABG 08/2012, normal LV function 60-65%.  - no chest pain, no SOB, or DOE. 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  - compliant with medications  2. HTN  - doesn't check regularly  - compliant with meds  - after recent increase in lisinopril patient noted some increased fatigue and lightheadedness that has now resolved  3. Hyperlipidemia  - compliant with statin, lipids typically checked regularly by his PCP Dr Nadara Mustard.  - 08/2012 lipid panel TC 141 TG 151 HDL 33 LDL 78 - reports more recent panel 1 month ago   Past Medical History  Diagnosis Date  . CAD (coronary artery disease)     a. NSTEMI s/p stent to distal RCA 03/2000. b. Inf MI s/p emergent thrombectomy/stenting mid RCA 10/2000. c. NSTEMI s/p CABGx4 (LIMA-LAD, SVG-OM2, seq SVG-acute marginal and PD) 01/15/57 - course complicated by confusion, post-op AF, L pleural effusion with thoracentesis. d. Normal LV function by echo 08/2012.  [name] HTN (hypertension)   . HLD (hyperlipidemia)   . GERD (gastroesophageal reflux disease)   . Hiatal hernia   . Prostate cancer     Status post radiation treatment.  . Tobacco abuse   . Diabetes mellitus   . Peripheral vascular disease     a. Carotid dopplers neg 08/13/12. b. Pre-cabg ABIs - R=0.87 suggesting mild dz, L=1.29 possibly falsely elevated due to calcified vessels.  . Pleural effusion     a. L pleural eff after CABG s/p thoracentesis 08/22/12.  [name] A-fib     a. Post-op afib after CABG 08/2012.  . Valvular heart disease     a. Mild  MR by TEE 08/2012.  [name] Prostatic hypertrophy     a. Hx of urinary retention, awaiting TURP.     No Known Allergies   Current Outpatient Prescriptions  Medication Sig Dispense Refill  . aspirin EC 81 MG tablet Take 1 tablet (81 mg total) by mouth daily.      [name] atorvastatin (LIPITOR) 80 MG tablet Take 1  tablet (80 mg total) by mouth daily.  90 tablet  3  . insulin glargine (LANTUS) 100 UNIT/ML injection Inject 40 Units into the skin at bedtime.  10 mL  0  . lisinopril (PRINIVIL,ZESTRIL) 40 MG tablet Take 1 tablet (40 mg total) by mouth daily.  30 tablet  6  . metFORMIN (GLUCOPHAGE-XR) 500 MG 24 hr tablet Take 1,000 mg by mouth 2 (two) times daily.       . ranitidine (ZANTAC) 150 MG tablet Take 150 mg by mouth daily.       No current facility-administered medications for this visit.     Past Surgical History  Procedure Laterality Date  . Back surgery    . Appendectomy    . Coronary artery bypass graft  08/16/2012    Procedure: CORONARY ARTERY BYPASS GRAFTING (CABG);  Surgeon: Melrose Nakayama, MD;  Location: [place];  Service: Open Heart Surgery;  Laterality: N/A;     No Known Allergies    No family history on file.   Social History [name] reports that he quit smoking about 41 years ago. He has never used smokeless tobacco. [name] reports that he does not drink alcohol.   Review of Systems CONSTITUTIONAL: No weight loss, fever, chills, weakness or fatigue.  HEENT: Eyes: No visual  loss, blurred vision, double vision or yellow sclerae.No hearing loss, sneezing, congestion, runny nose or sore throat.  SKIN: No rash or itching.  CARDIOVASCULAR: per HPI RESPIRATORY: No shortness of breath, cough or sputum.  GASTROINTESTINAL: No anorexia, nausea, vomiting or diarrhea. No abdominal pain or blood.  GENITOURINARY: No burning on urination, no polyuria NEUROLOGICAL: No headache, dizziness, syncope, paralysis, ataxia, numbness or tingling in the extremities. No change in bowel or bladder control.  MUSCULOSKELETAL: No muscle, back pain, joint pain or stiffness.  LYMPHATICS: No enlarged nodes. No history of splenectomy.  PSYCHIATRIC: No history of depression or anxiety.  ENDOCRINOLOGIC: No reports of sweating, cold or heat intolerance. No polyuria or polydipsia.  [name]   Physical  Examination p 73 bp 150/70 Wt 200 lbs BMI 30 Gen: resting comfortably, no acute distress HEENT: no scleral icterus, pupils equal round and reactive, no palptable cervical adenopathy,  CV: RRR, no m/r/g, no JVD, no carotid bruits Resp: Clear to auscultation bilaterally GI: abdomen is soft, non-tender, non-distended, normal bowel sounds, no hepatosplenomegaly MSK: extremities are warm, no edema.  Skin: warm, no rash Neuro:  no focal deficits Psych: appropriate affect   Diagnostic Studies 08/2012 Echo: LVEF 55-60%, mild MR.   10/28/13 Clinic EKG: sinus rhythm, LAD, Q waves inferior leads    Assessment and Plan   1. CAD  - no current symptoms  - continue current medications and risk factor modifications.   2. HTN  - not at goal  - start HCTZ 25mg  daily, check BMET in 3 weeks. Educated on [place].  - Goal bp given history of DM is <130/80   3. Hyperlipidemia  - will request recent panel from PCP, will request most recent lipids.    F/u 6 months    Arnoldo Lenis, M.D., F.A.C.C.

## 2014-06-05 ENCOUNTER — Encounter: Payer: Self-pay | Admitting: Cardiology

## 2014-06-05 ENCOUNTER — Ambulatory Visit (INDEPENDENT_AMBULATORY_CARE_PROVIDER_SITE_OTHER): Payer: 59 | Admitting: Cardiology

## 2014-06-05 VITALS — BP 111/73 | HR 74 | Ht 69.0 in | Wt 198.0 lb

## 2014-06-05 DIAGNOSIS — I25118 Atherosclerotic heart disease of native coronary artery with other forms of angina pectoris: Secondary | ICD-10-CM

## 2014-06-05 DIAGNOSIS — R5381 Other malaise: Secondary | ICD-10-CM

## 2014-06-05 DIAGNOSIS — R5383 Other fatigue: Secondary | ICD-10-CM

## 2014-06-05 DIAGNOSIS — I2510 Atherosclerotic heart disease of native coronary artery without angina pectoris: Secondary | ICD-10-CM

## 2014-06-05 DIAGNOSIS — I209 Angina pectoris, unspecified: Secondary | ICD-10-CM

## 2014-06-05 NOTE — Patient Instructions (Signed)
   Your physician has requested that you have an echocardiogram. Echocardiography is a painless test that uses sound waves to create images of your heart. It provides your doctor with information about the size and shape of your heart and how well your heart's chambers and valves are working. This procedure takes approximately one hour. There are no restrictions for this procedure. Office will contact with results via phone or letter.   Continue all current medications. Your physician wants you to follow up in: 6 months.  You will receive a reminder letter in the mail one-two months in advance.  If you don't receive a letter, please call our office to schedule the follow up appointment     

## 2014-06-05 NOTE — Progress Notes (Signed)
Clinical Summary [name] is a 68 y.o.male seen today for follow up of the following medical problems. He is seen today as a focused visit for new symptoms of fatigue.  1. Fatigue - started approx 2 weeks ago. Onset of cough and generalized fatigue. - denies any SOB or DOE, just overall low energy. No chest pain, no palpitations. No LE edema, no orthopnea, no PND. - symptoms somewhat correlate to recent tick bite, he is on emperic doxy.  - CXR showed left lower lobe infiltrate per pcp notes,  ordering a CT chest to further evaluate for possible effusion    Past Medical History  Diagnosis Date  . CAD (coronary artery disease)     a. NSTEMI s/p stent to distal RCA 03/2000. b. Inf MI s/p emergent thrombectomy/stenting mid RCA 10/2000. c. NSTEMI s/p CABGx4 (LIMA-LAD, SVG-OM2, seq SVG-acute marginal and PD) 08/14/43 - course complicated by confusion, post-op AF, L pleural effusion with thoracentesis. d. Normal LV function by echo 08/2012.  [name] HTN (hypertension)   . HLD (hyperlipidemia)   . GERD (gastroesophageal reflux disease)   . Hiatal hernia   . Prostate cancer     Status post radiation treatment.  . Tobacco abuse   . Diabetes mellitus   . Peripheral vascular disease     a. Carotid dopplers neg 08/13/12. b. Pre-cabg ABIs - R=0.87 suggesting mild dz, L=1.29 possibly falsely elevated due to calcified vessels.  . Pleural effusion     a. L pleural eff after CABG s/p thoracentesis 08/22/12.  [name] A-fib     a. Post-op afib after CABG 08/2012.  . Valvular heart disease     a. Mild  MR by TEE 08/2012.  [name] Prostatic hypertrophy     a. Hx of urinary retention, awaiting TURP.     No Known Allergies   Current Outpatient Prescriptions  Medication Sig Dispense Refill  . aspirin EC 81 MG tablet Take 1 tablet (81 mg total) by mouth daily.      [name] atorvastatin (LIPITOR) 80 MG tablet Take 1 tablet (80 mg total) by mouth daily.  90 tablet  3  . hydrochlorothiazide (HYDRODIURIL) 25 MG tablet Take 1  tablet (25 mg total) by mouth daily.  90 tablet  3  . insulin glargine (LANTUS) 100 UNIT/ML injection Inject 40 Units into the skin at bedtime.  10 mL  0  . lisinopril (PRINIVIL,ZESTRIL) 40 MG tablet Take 1 tablet (40 mg total) by mouth daily.  90 tablet  3  . metFORMIN (GLUCOPHAGE-XR) 500 MG 24 hr tablet Take 1,000 mg by mouth 2 (two) times daily.       . ranitidine (ZANTAC) 150 MG tablet Take 150 mg by mouth daily.       No current facility-administered medications for this visit.     Past Surgical History  Procedure Laterality Date  . Back surgery    . Appendectomy    . Coronary artery bypass graft  08/16/2012    Procedure: CORONARY ARTERY BYPASS GRAFTING (CABG);  Surgeon: Melrose Nakayama, MD;  Location: [facility];  Service: Open Heart Surgery;  Laterality: N/A;     No Known Allergies    No family history on file.   Social History [name] reports that he quit smoking about 41 years ago. He has never used smokeless tobacco. [name] reports that he does not drink alcohol.   Review of Systems CONSTITUTIONAL: No weight loss, fever, chills, weakness or fatigue.  HEENT: Eyes: No visual  loss, blurred vision, double vision or yellow sclerae.No hearing loss, sneezing, congestion, runny nose or sore throat.  SKIN: No rash or itching.  CARDIOVASCULAR: per HPI RESPIRATORY: No shortness of breath, cough or sputum.  GASTROINTESTINAL: No anorexia, nausea, vomiting or diarrhea. No abdominal pain or blood.  GENITOURINARY: No burning on urination, no polyuria NEUROLOGICAL: No headache, dizziness, syncope, paralysis, ataxia, numbness or tingling in the extremities. No change in bowel or bladder control.  MUSCULOSKELETAL: No muscle, back pain, joint pain or stiffness.  LYMPHATICS: No enlarged nodes. No history of splenectomy.  PSYCHIATRIC: No history of depression or anxiety.  ENDOCRINOLOGIC: No reports of sweating, cold or heat intolerance. No polyuria or polydipsia.  [name]   Physical  Examination p 74 bp 111/73 Wt 198 lbs BMI 29 Gen: resting comfortably, no acute distress HEENT: no scleral icterus, pupils equal round and reactive, no palptable cervical adenopathy,  CV: RRR, no m/r/g, no JVD Resp: Clear to auscultation bilaterally GI: abdomen is soft, non-tender, non-distended, normal bowel sounds, no hepatosplenomegaly MSK: extremities are warm, no edema.  Skin: warm, no rash Neuro:  no focal deficits Psych: appropriate affect   Diagnostic Studies 08/2012 Echo: LVEF 55-60%, mild MR.   10/28/13 Clinic EKG: sinus rhythm, LAD, Q waves inferior leads     Assessment and Plan  1. Fatigue - fairly generalized symptoms, not specifically cardiac by description. He does have strong history of CAD with last echo 2 years ago. CXR with possible effusion, unilateral would be less likely to be cardiac - will obtain echo to evaluate for possible cardiac etiology of new findings and symptoms.      F/u 6 months  [name], M.D., F.A.C.C.

## 2014-06-25 ENCOUNTER — Other Ambulatory Visit: Payer: Self-pay

## 2014-06-25 ENCOUNTER — Other Ambulatory Visit (INDEPENDENT_AMBULATORY_CARE_PROVIDER_SITE_OTHER): Payer: 59

## 2014-06-25 DIAGNOSIS — I25118 Atherosclerotic heart disease of native coronary artery with other forms of angina pectoris: Secondary | ICD-10-CM

## 2014-06-25 DIAGNOSIS — I4891 Unspecified atrial fibrillation: Secondary | ICD-10-CM

## 2014-06-25 DIAGNOSIS — I359 Nonrheumatic aortic valve disorder, unspecified: Secondary | ICD-10-CM

## 2014-06-25 DIAGNOSIS — I2510 Atherosclerotic heart disease of native coronary artery without angina pectoris: Secondary | ICD-10-CM

## 2014-07-03 ENCOUNTER — Telehealth: Payer: Self-pay | Admitting: *Deleted

## 2014-07-03 NOTE — Telephone Encounter (Signed)
Patient returned call

## 2014-07-03 NOTE — Telephone Encounter (Signed)
Returned your call again.

## 2014-07-03 NOTE — Telephone Encounter (Signed)
Notes Recorded by Laurine Blazer, LPN on 8/67/6720 at 9:47 AM Left message to return call.

## 2014-07-03 NOTE — Telephone Encounter (Signed)
Message copied by Laurine Blazer on Thu Jul 03, 2014  9:58 AM ------      Message from: [place] F      Created: Tue Jul 01, 2014 12:59 PM       Echo looks good. Heart does not appear to be cause of symptoms ------

## 2014-07-07 NOTE — Telephone Encounter (Signed)
Notes Recorded by Laurine Blazer, LPN on 02/22/9701 at 6:37 PM Patient notified. Will forward results to PMD Nadara Mustard).

## 2014-11-20 ENCOUNTER — Encounter (HOSPITAL_COMMUNITY): Payer: Self-pay | Admitting: Cardiology

## 2014-12-01 ENCOUNTER — Other Ambulatory Visit: Payer: Self-pay | Admitting: *Deleted

## 2014-12-01 MED ORDER — LISINOPRIL 40 MG PO TABS: 40.0000 mg | ORAL_TABLET | Freq: Every day | ORAL | Status: DC

## 2015-01-27 ENCOUNTER — Other Ambulatory Visit: Payer: Self-pay | Admitting: *Deleted

## 2015-01-27 MED ORDER — ATORVASTATIN CALCIUM 80 MG PO TABS: 80.0000 mg | ORAL_TABLET | Freq: Every day | ORAL | Status: DC

## 2015-02-23 ENCOUNTER — Other Ambulatory Visit: Payer: Self-pay | Admitting: *Deleted

## 2015-02-23 MED ORDER — HYDROCHLOROTHIAZIDE 25 MG PO TABS: 25.0000 mg | ORAL_TABLET | Freq: Every day | ORAL | Status: DC

## 2015-05-27 ENCOUNTER — Other Ambulatory Visit: Payer: Self-pay | Admitting: *Deleted

## 2015-05-27 MED ORDER — HYDROCHLOROTHIAZIDE 25 MG PO TABS: 25.0000 mg | ORAL_TABLET | Freq: Every day | ORAL | Status: DC

## 2018-11-18 DIAGNOSIS — I610 Nontraumatic intracerebral hemorrhage in hemisphere, subcortical: Secondary | ICD-10-CM | POA: Insufficient documentation

## 2018-11-18 DIAGNOSIS — I615 Nontraumatic intracerebral hemorrhage, intraventricular: Secondary | ICD-10-CM | POA: Insufficient documentation

## 2018-12-16 DIAGNOSIS — S31809A Unspecified open wound of unspecified buttock, initial encounter: Secondary | ICD-10-CM | POA: Insufficient documentation

## 2018-12-21 ENCOUNTER — Inpatient Hospital Stay
Admission: RE | Admit: 2018-12-21 | Discharge: 2019-01-04 | Disposition: A | Discharge status: Nursing Home (Includes ICF) | Payer: Medicare Other | Source: Ambulatory Visit | Attending: Internal Medicine | Admitting: Internal Medicine

## 2018-12-24 ENCOUNTER — Other Ambulatory Visit (HOSPITAL_COMMUNITY)
Admission: RE | Admit: 2018-12-24 | Discharge: 2018-12-24 | Disposition: A | Discharge status: Home / Self Care | Payer: Medicare Other | Source: Skilled Nursing Facility | Attending: Internal Medicine | Admitting: Internal Medicine

## 2018-12-24 ENCOUNTER — Encounter: Payer: Self-pay | Admitting: Internal Medicine

## 2018-12-24 ENCOUNTER — Non-Acute Institutional Stay (SKILLED_NURSING_FACILITY): Payer: Medicare Other | Admitting: Internal Medicine

## 2018-12-24 DIAGNOSIS — IMO0001 Reserved for inherently not codable concepts without codable children: Secondary | ICD-10-CM

## 2018-12-24 DIAGNOSIS — I10 Essential (primary) hypertension: Secondary | ICD-10-CM

## 2018-12-24 DIAGNOSIS — S31809S Unspecified open wound of unspecified buttock, sequela: Secondary | ICD-10-CM

## 2018-12-24 DIAGNOSIS — E119 Type 2 diabetes mellitus without complications: Secondary | ICD-10-CM

## 2018-12-24 DIAGNOSIS — Z8673 Personal history of transient ischemic attack (TIA), and cerebral infarction without residual deficits: Secondary | ICD-10-CM | POA: Insufficient documentation

## 2018-12-24 DIAGNOSIS — I610 Nontraumatic intracerebral hemorrhage in hemisphere, subcortical: Secondary | ICD-10-CM

## 2018-12-24 DIAGNOSIS — R6889 Other general symptoms and signs: Secondary | ICD-10-CM | POA: Insufficient documentation

## 2018-12-24 DIAGNOSIS — R339 Retention of urine, unspecified: Secondary | ICD-10-CM | POA: Insufficient documentation

## 2018-12-24 DIAGNOSIS — Z794 Long term (current) use of insulin: Secondary | ICD-10-CM

## 2018-12-24 LAB — BASIC METABOLIC PANEL
Anion gap: 8 (ref 5–15)
BUN: 37 mg/dL — AB (ref 8–23)
CALCIUM: 8.8 mg/dL — AB (ref 8.9–10.3)
CHLORIDE: 99 mmol/L (ref 98–111)
CO2: 30 mmol/L (ref 22–32)
CREATININE: 1.1 mg/dL (ref 0.61–1.24)
GFR calc non Af Amer: >60 mL/min (ref >60)
Glucose, Bld: 298 mg/dL — ABNORMAL HIGH (ref 70–99)
Potassium: 4.1 mmol/L (ref 3.5–5.1)
Sodium: 137 mmol/L (ref 135–145)

## 2018-12-24 LAB — CBC WITH DIFFERENTIAL/PLATELET
Abs Immature Granulocytes: 0.03 10*3/uL (ref 0.00–0.07)
BASOS ABS: 0 10*3/uL (ref 0.0–0.1)
Basophils Relative: 1 %
EOS ABS: 0.2 10*3/uL (ref 0.0–0.5)
EOS PCT: 3 %
HCT: 43.7 % (ref 39.0–52.0)
HEMOGLOBIN: 13.7 g/dL (ref 13.0–17.0)
Immature Granulocytes: 0 %
LYMPHS ABS: 1.4 10*3/uL (ref 0.7–4.0)
LYMPHS PCT: 19 %
MCH: 29.7 pg (ref 26.0–34.0)
MCHC: 31.4 g/dL (ref 30.0–36.0)
MCV: 94.6 fL (ref 80.0–100.0)
Monocytes Absolute: 0.7 10*3/uL (ref 0.1–1.0)
Monocytes Relative: 9 %
NRBC: 0 % (ref 0.0–0.2)
Neutro Abs: 5 10*3/uL (ref 1.7–7.7)
Neutrophils Relative %: 68 %
Platelets: 271 10*3/uL (ref 150–400)
RBC: 4.62 MIL/uL (ref 4.22–5.81)
RDW: 13.1 % (ref 11.5–15.5)
WBC: 7.4 10*3/uL (ref 4.0–10.5)

## 2018-12-24 MED ORDER — ONDANSETRON HCL 4 MG/2ML IJ SOLN
4.00 | INTRAMUSCULAR | Status: DC
Start: ? — End: 2018-12-24

## 2018-12-24 MED ORDER — INSULIN GLARGINE 100 UNIT/ML ~~LOC~~ SOLN
1.00 | SUBCUTANEOUS | Status: DC
Start: 2018-12-21 — End: 2018-12-24

## 2018-12-24 MED ORDER — FIRST-LANSOPRAZOLE 3 MG/ML PO SUSP
30.00 | ORAL | Status: DC
Start: 2018-12-21 — End: 2018-12-24

## 2018-12-24 MED ORDER — METOPROLOL TARTRATE 50 MG PO TABS
50.00 | ORAL_TABLET | ORAL | Status: DC
Start: 2018-12-21 — End: 2018-12-24

## 2018-12-24 MED ORDER — INSULIN GLARGINE 100 UNIT/ML ~~LOC~~ SOLN
1.00 | SUBCUTANEOUS | Status: DC
Start: ? — End: 2018-12-24

## 2018-12-24 MED ORDER — FLUOXETINE HCL 20 MG/5ML PO SOLN
30.00 | ORAL | Status: DC
Start: 2018-12-22 — End: 2018-12-24

## 2018-12-24 MED ORDER — INSULIN LISPRO 100 UNIT/ML ~~LOC~~ SOLN
1.00 | SUBCUTANEOUS | Status: DC
Start: 2018-12-21 — End: 2018-12-24

## 2018-12-24 MED ORDER — ATORVASTATIN CALCIUM 80 MG PO TABS
80.00 | ORAL_TABLET | ORAL | Status: DC
Start: 2018-12-21 — End: 2018-12-24

## 2018-12-24 MED ORDER — IOHEXOL 240 MG/ML SOLN
50.00 | INTRAMUSCULAR | Status: DC
Start: ? — End: 2018-12-24

## 2018-12-24 MED ORDER — ACETAMINOPHEN 325 MG PO TABS
650.00 | ORAL_TABLET | ORAL | Status: DC
Start: ? — End: 2018-12-24

## 2018-12-24 MED ORDER — INSULIN LISPRO 100 UNIT/ML ~~LOC~~ SOLN
1.00 | SUBCUTANEOUS | Status: DC
Start: ? — End: 2018-12-24

## 2018-12-24 MED ORDER — MELATONIN 1 MG PO TABS
2.00 | ORAL_TABLET | ORAL | Status: DC
Start: 2018-12-21 — End: 2018-12-24

## 2018-12-24 MED ORDER — HYDRALAZINE HCL 20 MG/ML IJ SOLN
10.00 | INTRAMUSCULAR | Status: DC
Start: ? — End: 2018-12-24

## 2018-12-24 MED ORDER — HYDROCORTISONE 2.5 % RE CREA
TOPICAL_CREAM | RECTAL | Status: DC
Start: 2018-12-21 — End: 2018-12-24

## 2018-12-24 MED ORDER — GENERIC EXTERNAL MEDICATION
1.00 | Status: DC
Start: ? — End: 2018-12-24

## 2018-12-24 MED ORDER — MODAFINIL 100 MG PO TABS
100.00 | ORAL_TABLET | ORAL | Status: DC
Start: 2018-12-21 — End: 2018-12-24

## 2018-12-24 MED ORDER — AMLODIPINE BESYLATE 10 MG PO TABS
10.00 | ORAL_TABLET | ORAL | Status: DC
Start: 2018-12-22 — End: 2018-12-24

## 2018-12-24 MED ORDER — LABETALOL HCL 5 MG/ML IV SOLN
10.00 | INTRAVENOUS | Status: DC
Start: ? — End: 2018-12-24

## 2018-12-24 NOTE — Progress Notes (Signed)
Provider: Veleta Miners MD  Location:    [facility] Room Number: 130/P Place of Service:  SNF (31)  PCP: [name], MD Patient Care Team: [name], MD as PCP - General (Family Medicine) [name], MD as Consulting Physician (Urology)  Extended Emergency Contact Information Primary Emergency Contact: Knoebel,[name] Address: [address]          [place] [address] [name] of [place] Phone: [phone] Mobile Phone: [phone] Relation: Spouse Secondary Emergency Contact: [name] of [place] Phone: [phone] Relation: Son  Code Status: Full Code Goals of Care: Advanced Directive information Advanced Directives 12/24/2018  Does Patient Have a Medical Advance Directive? Yes  Type of Advance Directive (No Data)  Does patient want to make changes to medical advance directive? No - Patient declined  Pre-existing out of facility DNR order (yellow form or pink MOST form) -      Chief Complaint  Patient presents with  . New Admit To SNF    New Admission Visit    HPI: Patient is a 73 y.o. male seen today for admission to SNF for therapy. Patient was admitted in [facility] from 12/08-01/10 after sustaining Acute nontraumatic subcortical hemorrhage of left hemisphere  Patient had past medical history of hypertension, diabetes mellitus, hyperlipidemia and history of prostate hypertrophy, CAD Most of the history was obtained by his wife and medical records. Patient unable to give me any history.  Per his wife patient had sudden onset of right sided weakness and slurred speech.  He had a head CT done in [place] which showed acute left thalamic intraparenchymal hemorrhage. He was transferred to [facility].  His repeat CT showed large intraparenchymal hemorrhage with edema and midline shift.  He was managed medically and conservatively.  Including treatment with mannitol and hypertonic saline. Treated for UTI and  aspiration pneumonia.  Due to recurrent fevers and aspiration he had PEG tube placement.  Also was found to have gastric ulcer which was injected.   Patient also was very lethargic and was started on Provigil.  Prozac was also added for neuro activation. Patient also had urinary retention.  He failed number of voiding trials.  Was evaluated by urology which recommended to continue Foley catheter for now.  Being in the facility patient is very restless.  He keeps trying to pull his PEG tube out.  Tries to get out of the bed.  Per nurses and his wife is not able to rest and sleep Patient does not want and is has aphasia. Before the stroke patient was very active driving lives with his wife  Past Medical History:  Diagnosis Date  . A-fib ([place])    a. Post-op afib after CABG 08/2012.  [name] CAD (coronary artery disease)    a. NSTEMI s/p stent to distal RCA 03/2000. b. Inf MI s/p emergent thrombectomy/stenting mid RCA 10/2000. c. NSTEMI s/p CABGx4 (LIMA-LAD, SVG-OM2, seq SVG-acute marginal and PD) 06/15/11 - course complicated by confusion, post-op AF, L pleural effusion with thoracentesis. d. Normal LV function by echo 08/2012.  . Diabetes mellitus   . GERD (gastroesophageal reflux disease)   . Hiatal hernia   . HLD (hyperlipidemia)   . HTN (hypertension)   . Peripheral vascular disease ([place])    a. Carotid dopplers neg 08/13/12. b. Pre-cabg ABIs - R=0.87 suggesting mild dz, L=1.29 possibly falsely elevated due to calcified vessels.  . Pleural effusion    a. L pleural eff after CABG s/p thoracentesis 08/22/12.  [name]  Prostate cancer [facility])    Status post radiation treatment.  . Prostatic hypertrophy    a. Hx of urinary retention, awaiting TURP.  . Tobacco abuse   . Valvular heart disease    a. Mild  MR by TEE 08/2012.   Past Surgical History:  Procedure Laterality Date  . APPENDECTOMY    . BACK SURGERY    . CORONARY ARTERY BYPASS GRAFT  08/16/2012   Procedure: CORONARY ARTERY BYPASS GRAFTING (CABG);   Surgeon: Melrose Nakayama, MD;  Location: [facility];  Service: Open Heart Surgery;  Laterality: N/A;  . LEFT HEART CATHETERIZATION WITH CORONARY ANGIOGRAM N/A 08/14/2012   Procedure: LEFT HEART CATHETERIZATION WITH CORONARY ANGIOGRAM;  Surgeon: Peter M Martinique, MD;  Location: [facility] CATH LAB;  Service: Cardiovascular;  Laterality: N/A;    reports that he quit smoking about 46 years ago. He has never used smokeless tobacco. He reports that he does not drink alcohol or use drugs. Social History   Socioeconomic History  . Marital status: Married    Spouse name: Not on file  . Number of children: Not on file  . Years of education: Not on file  . Highest education level: Not on file  Occupational History  . Not on file  Social Needs  . Financial resource strain: Not on file  . Food insecurity:    Worry: Not on file    Inability: Not on file  . Transportation needs:    Medical: Not on file    Non-medical: Not on file  Tobacco Use  . Smoking status: Former Smoker    Last attempt to quit: 11/12/1972    Years since quitting: 46.1  . Smokeless tobacco: Never Used  Substance and Sexual Activity  . Alcohol use: No  . Drug use: No  . Sexual activity: Never  Lifestyle  . Physical activity:    Days per week: Not on file    Minutes per session: Not on file  . Stress: Not on file  Relationships  . Social connections:    Talks on phone: Not on file    Gets together: Not on file    Attends religious service: Not on file    Active member of club or organization: Not on file    Attends meetings of clubs or organizations: Not on file    Relationship status: Not on file  . Intimate partner violence:    Fear of current or ex partner: Not on file    Emotionally abused: Not on file    Physically abused: Not on file    Forced sexual activity: Not on file  Other Topics Concern  . Not on file  Social History Narrative  . Not on file    Functional Status Survey:    History reviewed. No pertinent  family history.  Health Maintenance  Topic Date Due  . INFLUENZA VACCINE  01/24/2019 (Originally 07/12/2018)  . FOOT EXAM  01/24/2019 (Originally 06/14/1956)  . HEMOGLOBIN A1C  01/24/2019 (Originally 02/10/2013)  . OPHTHALMOLOGY EXAM  01/24/2019 (Originally 06/14/1956)  . URINE MICROALBUMIN  01/24/2019 (Originally 06/14/1956)  . COLONOSCOPY  01/24/2019 (Originally 06/14/1994)  . TETANUS/TDAP  01/24/2019 (Originally 06/15/1963)  . Hepatitis C Screening  01/24/2019 (Originally 06-25-1946)  . PNA vac Low Risk Adult (1 of 2 - PCV13) 01/24/2019 (Originally 06/14/2009)    No Known Allergies  Outpatient Encounter Medications as of 12/24/2018  Medication Sig  . acetaminophen (TYLENOL) 325 MG tablet Take 650 mg by mouth every 6 (six) hours as  needed.  [name] amLODipine (NORVASC) 10 MG tablet Give once a day per gastric tube  . atorvastatin (LIPITOR) 80 MG tablet Take 1 tablet (80 mg total) by mouth daily.  [name] FLUoxetine (PROZAC) 20 MG/5ML solution Place 30 mg into feeding tube daily.  . hydrocortisone (ANUSOL-HC) 25 MG suppository Place 25 mg rectally 2 (two) times daily.  . lansoprazole (PREVACID) 30 MG capsule Take 30 mg by mouth 2 (two) times daily.  [name] LORazepam (ATIVAN) 0.5 MG tablet Take 0.5 mg by mouth every 6 (six) hours as needed for anxiety.  . Melatonin 1 MG TABS 2 mg by Feeding Tube route at bedtime.  . metFORMIN (GLUCOPHAGE-XR) 500 MG 24 hr tablet 1,000 mg 2 (two) times daily. Give by gastric tube  . metoprolol tartrate (LOPRESSOR) 50 MG tablet Place 50 mg into feeding tube 2 (two) times daily.  . modafinil (PROVIGIL) 100 MG tablet Place 100 mg into feeding tube 2 (two) times daily.  . [DISCONTINUED] aspirin EC 81 MG tablet Take 1 tablet (81 mg total) by mouth daily.  . [DISCONTINUED] hydrochlorothiazide (HYDRODIURIL) 25 MG tablet Take 1 tablet (25 mg total) by mouth daily.  . [DISCONTINUED] insulin glargine (LANTUS) 100 UNIT/ML injection Inject 40 Units into the skin at bedtime.  . [DISCONTINUED]  lisinopril (PRINIVIL,ZESTRIL) 40 MG tablet Take 1 tablet (40 mg total) by mouth daily.  . [DISCONTINUED] ranitidine (ZANTAC) 150 MG tablet Take 150 mg by mouth daily.   No facility-administered encounter medications on file as of 12/24/2018.      Review of Systems  Unable to perform ROS: Patient nonverbal    Vitals:   12/24/18 1133  BP: (!) 164/83  Pulse: 76  Resp: 20  Temp: 97.6 F (36.4 C)  TempSrc: Oral  SpO2: 97%   There is no height or weight on file to calculate BMI. Physical Exam Vitals signs reviewed.  Constitutional:      Appearance: Normal appearance.  HENT:     Head: Normocephalic.     Nose: Nose normal.     Mouth/Throat:     Comments: Severe Thrush Eyes:     Pupils: Pupils are equal, round, and reactive to light.  Neck:     Musculoskeletal: Neck supple.  Cardiovascular:     Rate and Rhythm: Normal rate and regular rhythm.     Pulses: Normal pulses.     Heart sounds: Normal heart sounds.  Pulmonary:     Effort: Pulmonary effort is normal. No respiratory distress.     Breath sounds: Normal breath sounds. No wheezing or rales.  Abdominal:     General: Abdomen is flat. Bowel sounds are normal. There is no distension.     Palpations: Abdomen is soft.     Tenderness: There is no abdominal tenderness. There is no guarding.  Musculoskeletal:        General: No swelling.  Skin:    General: Skin is warm.     Comments: Has Stage 3 Sacral pressure Ulcer with necrosis  Neurological:     Mental Status: He is alert.     Comments: Patient has Right UE hemiparesis. Does move his LE involuntarily. Good Strength in Left Upper and Lower extremities. Aphasic. Does not follow commands. No sensible words.  Psychiatric:     Comments: Cannot assess     Labs reviewed: Basic Metabolic Panel: Recent Labs    12/24/18 0850  NA 137  K 4.1  CL 99  CO2 30  GLUCOSE 298*  BUN 37*  CREATININE 1.10  CALCIUM 8.8*   Liver Function Tests: No results for input(s): AST,  ALT, ALKPHOS, BILITOT, PROT, ALBUMIN in the last 8760 hours. No results for input(s): LIPASE, AMYLASE in the last 8760 hours. No results for input(s): AMMONIA in the last 8760 hours. CBC: Recent Labs    12/24/18 0850  WBC 7.4  NEUTROABS 5.0  HGB 13.7  HCT 43.7  MCV 94.6  PLT 271   Cardiac Enzymes: No results for input(s): CKTOTAL, CKMB, CKMBINDEX, TROPONINI in the last 8760 hours. BNP: Invalid input(s): POCBNP Lab Results  Component Value Date   HGBA1C 7.6 (H) 08/13/2012   No results found for: TSH No results found for: VITAMINB12 No results found for: FOLATE No results found for: IRON, TIBC, FERRITIN  Imaging and Procedures obtained prior to SNF admission: No results found.  Assessment/Plan Nontraumatic subcortical hemorrhage of left cerebral hemisphere  Severe Neuro Deficits Start therapy. Discontinue Provigil as he is very anxious and restless Continue Ativan as needed for agitation Continue on Prozac  IDDM (insulin dependent diabetes mellitus)  BS mostly running more then 250 A1C was more then 9 in hospital Will start on Lantus 10 units Continue Metformin Sliding scale for now  Essential hypertension BP mildly high but patient very Anxious Continue to monitor On Norvasc and metroprolol  Wound of buttock, D/w wound care Santyl and Allevyn  Urinary retention Continue Foley for now. Will help his wound also Urology follow up PEG Tube Feeding NPO for  Now Speech to follow  Gastric Ulcer On Protonix Follow with Gastro Hyperlipidemia On Atrovastatin   Family/ staff Communication:   Labs/tests ordered:  Total time spent in this patient care encounter was _45 minutes; greater than 50% of the visit spent counseling patient, reviewing records , Labs and coordinating care for problems addressed at this encounter.

## 2018-12-26 ENCOUNTER — Other Ambulatory Visit: Payer: Self-pay

## 2018-12-26 MED ORDER — LORAZEPAM 0.5 MG PO TABS: 0.5000 mg | ORAL_TABLET | Freq: Four times a day (QID) | ORAL | 0 refills | Status: DC | PRN

## 2018-12-26 NOTE — Telephone Encounter (Signed)
RX Fax for [facility]@ [phone]  

## 2018-12-27 ENCOUNTER — Non-Acute Institutional Stay (SKILLED_NURSING_FACILITY): Payer: Medicare Other | Admitting: Internal Medicine

## 2018-12-27 ENCOUNTER — Encounter: Payer: Self-pay | Admitting: Internal Medicine

## 2018-12-27 ENCOUNTER — Other Ambulatory Visit (HOSPITAL_COMMUNITY)
Admission: RE | Admit: 2018-12-27 | Discharge: 2018-12-27 | Disposition: A | Discharge status: Home / Self Care | Payer: Medicare Other | Source: Skilled Nursing Facility | Attending: Internal Medicine | Admitting: Internal Medicine

## 2018-12-27 DIAGNOSIS — E119 Type 2 diabetes mellitus without complications: Secondary | ICD-10-CM | POA: Diagnosis not present

## 2018-12-27 DIAGNOSIS — S31809S Unspecified open wound of unspecified buttock, sequela: Secondary | ICD-10-CM | POA: Diagnosis not present

## 2018-12-27 DIAGNOSIS — R339 Retention of urine, unspecified: Secondary | ICD-10-CM

## 2018-12-27 DIAGNOSIS — R197 Diarrhea, unspecified: Secondary | ICD-10-CM | POA: Insufficient documentation

## 2018-12-27 DIAGNOSIS — IMO0001 Reserved for inherently not codable concepts without codable children: Secondary | ICD-10-CM

## 2018-12-27 DIAGNOSIS — Z794 Long term (current) use of insulin: Secondary | ICD-10-CM

## 2018-12-27 DIAGNOSIS — I610 Nontraumatic intracerebral hemorrhage in hemisphere, subcortical: Secondary | ICD-10-CM

## 2018-12-27 LAB — C DIFFICILE QUICK SCREEN W PCR REFLEX
C DIFFICILE (CDIFF) INTERP: NOT DETECTED
C Diff antigen: NEGATIVE
C Diff toxin: NEGATIVE

## 2018-12-27 NOTE — Progress Notes (Signed)
Location:    [place] Room Number: 130/P Place of Service:  SNF [phone]) Provider:  Veleta Miners MD  [name], MD  Patient Care Team: [name], MD as PCP - General (Family Medicine) [name], MD as Consulting Physician (Urology)  Extended Emergency Contact Information Primary Emergency Contact: Vigil,[name] Address: [address]          [place] [address] [name] of [place] Phone: [phone] Mobile Phone: [phone] Relation: Spouse Secondary Emergency Contact: [name] [place] of [place] Phone: [phone] Relation: Son  Code Status:  Full Code Goals of care: Advanced Directive information Advanced Directives 12/27/2018  Does Patient Have a Medical Advance Directive? Yes  Type of Advance Directive (No Data)  Does patient want to make changes to medical advance directive? No - Patient declined  Pre-existing out of facility DNR order (yellow form or pink MOST form) -     Chief Complaint  Patient presents with  . Acute Visit    Diarrhea and Behavior Issues    HPI:  Pt is a 73 y.o. male seen today for an acute visit for Diarrhea, Fall this Morning and follow up on his BS. Patient was admitted in [place] from 12/08-01/10 after sustaining Acute nontraumatic subcortical hemorrhage of left hemisphere  Patient had past medical history of hypertension, diabetes mellitus, hyperlipidemia and history of prostate hypertrophy, CAD  Most of the history was obtained by his wife and medical records. Patient unable to give me any history.  Per his wife patient had sudden onset of right sided weakness and slurred speech.  He had a head CT done in [place] which showed acute left thalamic intraparenchymal hemorrhage. He was transferred to [facility].  His repeat CT showed large intraparenchymal hemorrhage with edema and midline shift.  He was managed medically and conservatively.  Including treatment with mannitol and  hypertonic saline. Treated for UTI and aspiration pneumonia.  Due to recurrent fevers and aspiration he had PEG tube placement.  Also was found to have gastric ulcer which was injected.   Patient also was very lethargic and was started on Provigil.  Prozac was also added for neuro activation. Patient also had urinary retention.  He failed number of voiding trials.  Was evaluated by urology which recommended to continue Foley catheter for now. Since been in the facility patient has been very restless. He was taken off Provigil And nurses have been using Ativan PRN. He takes his dressing off. Removes his Diaper and Clothes. He fell this morning though did not sustain any injuries. He is talking little more but still mumbles and does not make any sense He also has had diarrhea for past few days.No Fever or Abdominal Pain noticed  Past Medical History:  Diagnosis Date  . A-fib ([place])    a. Post-op afib after CABG 08/2012.  [name] CAD (coronary artery disease)    a. NSTEMI s/p stent to distal RCA 03/2000. b. Inf MI s/p emergent thrombectomy/stenting mid RCA 10/2000. c. NSTEMI s/p CABGx4 (LIMA-LAD, SVG-OM2, seq SVG-acute marginal and PD) 08/20/91 - course complicated by confusion, post-op AF, L pleural effusion with thoracentesis. d. Normal LV function by echo 08/2012.  . Diabetes mellitus   . GERD (gastroesophageal reflux disease)   . Hiatal hernia   . HLD (hyperlipidemia)   . HTN (hypertension)   . Peripheral vascular disease ([place])    a. Carotid dopplers neg 08/13/12. b. Pre-cabg ABIs - R=0.87 suggesting mild dz, L=1.29 possibly falsely elevated due  to calcified vessels.  . Pleural effusion    a. L pleural eff after CABG s/p thoracentesis 08/22/12.  . Prostate cancer [facility])    Status post radiation treatment.  . Prostatic hypertrophy    a. Hx of urinary retention, awaiting TURP.  . Tobacco abuse   . Valvular heart disease    a. Mild  MR by TEE 08/2012.   Past Surgical History:  Procedure Laterality  Date  . APPENDECTOMY    . BACK SURGERY    . CORONARY ARTERY BYPASS GRAFT  08/16/2012   Procedure: CORONARY ARTERY BYPASS GRAFTING (CABG);  Surgeon: Melrose Nakayama, MD;  Location: [place];  Service: Open Heart Surgery;  Laterality: N/A;  . LEFT HEART CATHETERIZATION WITH CORONARY ANGIOGRAM N/A 08/14/2012   Procedure: LEFT HEART CATHETERIZATION WITH CORONARY ANGIOGRAM;  Surgeon: Peter M Martinique, MD;  Location: [facility] CATH LAB;  Service: Cardiovascular;  Laterality: N/A;    No Known Allergies  Outpatient Encounter Medications as of 12/27/2018  Medication Sig  . acetaminophen (TYLENOL) 325 MG tablet Take 650 mg by mouth every 6 (six) hours as needed.  [name] amLODipine (NORVASC) 10 MG tablet Give once a day per gastric tube  . atorvastatin (LIPITOR) 80 MG tablet Take 1 tablet (80 mg total) by mouth daily.  . collagenase (SANTYL) ointment Apply 1 application topically daily. Apply to sacrum wound per tx order.  . Dimethicone 1.3 % LOTN Apply to the extremities and back daily and prn for itching /dry skin do not apply to buttocks wound.  [name] FLUoxetine (PROZAC) 20 MG/5ML solution Place 30 mg into feeding tube daily.  . hydrocortisone (ANUSOL-HC) 25 MG suppository Place 25 mg rectally 2 (two) times daily.  . insulin aspart (NOVOLOG FLEXPEN) 100 UNIT/ML FlexPen Give per sliding scale  . insulin glargine (LANTUS) 100 UNIT/ML injection Inject 50 Units into the skin daily.  . lansoprazole (PREVACID) 30 MG capsule Take 30 mg by mouth 2 (two) times daily.  [name] LORazepam (ATIVAN) 0.5 MG tablet Take 1 tablet (0.5 mg total) by mouth every 6 (six) hours as needed for anxiety.  . Melatonin 1 MG TABS 2 mg by Feeding Tube route at bedtime.  . metFORMIN (GLUCOPHAGE-XR) 500 MG 24 hr tablet 1,000 mg 2 (two) times daily. Give by gastric tube  . metoprolol tartrate (LOPRESSOR) 50 MG tablet Place 50 mg into feeding tube 2 (two) times daily.  . [DISCONTINUED] modafinil (PROVIGIL) 100 MG tablet Place 100 mg into feeding tube 2  (two) times daily.   No facility-administered encounter medications on file as of 12/27/2018.      Review of Systems  Unable to perform ROS: Dementia     There is no immunization history on file for this patient. Pertinent  Health Maintenance Due  Topic Date Due  . INFLUENZA VACCINE  01/24/2019 (Originally 07/12/2018)  . FOOT EXAM  01/24/2019 (Originally 06/14/1956)  . HEMOGLOBIN A1C  01/24/2019 (Originally 02/10/2013)  . OPHTHALMOLOGY EXAM  01/24/2019 (Originally 06/14/1956)  . URINE MICROALBUMIN  01/24/2019 (Originally 06/14/1956)  . COLONOSCOPY  01/24/2019 (Originally 06/14/1994)  . PNA vac Low Risk Adult (1 of 2 - PCV13) 01/24/2019 (Originally 06/14/2009)   No flowsheet data found. Functional Status Survey:    Vitals:   12/27/18 1058  BP: (!) 142/82  Pulse: 70  Resp: 20  Temp: (!) 97.1 F (36.2 C)  TempSrc: Oral  SpO2: 96%   There is no height or weight on file to calculate BMI. Physical Exam Vitals signs reviewed.  Constitutional:  Appearance: Normal appearance.  HENT:     Head: Normocephalic.     Nose: Nose normal.     Mouth/Throat:     Comments: Mild Thrush Eyes:     Pupils: Pupils are equal, round, and reactive to light.  Neck:     Musculoskeletal: Neck supple.  Cardiovascular:     Rate and Rhythm: Normal rate and regular rhythm.     Pulses: Normal pulses.     Heart sounds: Normal heart sounds.  Pulmonary:     Effort: Pulmonary effort is normal. No respiratory distress.     Breath sounds: Normal breath sounds. No wheezing or rales.  Abdominal:     General: Abdomen is flat. Bowel sounds are normal. There is no distension.     Palpations: Abdomen is soft.     Tenderness: There is no abdominal tenderness. There is no guarding.  Musculoskeletal:        General: No swelling.  Skin:    General: Skin is warm.     Comments: Has Stage 3 Sacral pressure Ulcer with necrosis  Neurological:     Mental Status: He is alert.     Comments: Patient has Right UE  hemiparesis. Does move his LE involuntarily. Good Strength in Left Upper and Lower extremities. Aphasic. Does not follow commands. No sensible words.  Psychiatric:     Comments: Cannot assess     Labs reviewed: Recent Labs    12/24/18 0850  NA 137  K 4.1  CL 99  CO2 30  GLUCOSE 298*  BUN 37*  CREATININE 1.10  CALCIUM 8.8*   No results for input(s): AST, ALT, ALKPHOS, BILITOT, PROT, ALBUMIN in the last 8760 hours. Recent Labs    12/24/18 0850  WBC 7.4  NEUTROABS 5.0  HGB 13.7  HCT 43.7  MCV 94.6  PLT 271   No results found for: TSH Lab Results  Component Value Date   HGBA1C 7.6 (H) 08/13/2012   Lab Results  Component Value Date   CHOL 141 08/14/2012   HDL 33 (L) 08/14/2012   LDLCALC 78 08/14/2012   TRIG 151 (H) 08/14/2012   CHOLHDL 4.3 08/14/2012    Significant Diagnostic Results in last 30 days:  No results found.  Assessment/Plan Nontraumatic subcortical hemorrhage of left cerebral hemisphere  Severe Neuro Deficits Discontinued Provigil as he was very anxious and restless Continue Ativan as needed for agitation Continue on Prozac Start on Seroquel 12.5 mg BID. Increase in few weeks D/W therapy Patient not making much progress. Continues to be dependent and does nto follow Commands Prognosis Poor Diarrhea Will discontinue Metformin Send Stool for C Diff D/W the Dietician . Will continue Glycerna for PEG feeding for now  IDDM (insulin dependent diabetes mellitus)  A1C was more then 9 in hospital But since being on Lantus BS are running Less then 200 right now  Essential hypertension BP mildly high but patient very Anxious Continue to monitor On Norvasc and metroprolol  Wound of buttock, D/w wound care Santyl and Covaderm It is little better. Still has [facility] Urinary retention Continue Foley for now. Will help his wound also Urology follow up pending PEG Tube Feeding NPO for  Now Speech to follow  On Glycerna Gastric Ulcer On  Protonix Follow with Gastro Hyperlipidemia On Atrovastatin     Family/ staff Communication:   Labs/tests ordered:   Total time spent in this patient care encounter was 45_ minutes; greater than 50% of the visit spent counseling wife, reviewing records , Labs  and coordinating care for problems addressed at this encounter.

## 2018-12-31 ENCOUNTER — Encounter (HOSPITAL_COMMUNITY)
Admission: RE | Admit: 2018-12-31 | Discharge: 2018-12-31 | Disposition: A | Discharge status: Home / Self Care | Payer: Medicare Other | Source: Skilled Nursing Facility | Attending: Internal Medicine | Admitting: Internal Medicine

## 2018-12-31 DIAGNOSIS — I10 Essential (primary) hypertension: Secondary | ICD-10-CM | POA: Insufficient documentation

## 2018-12-31 LAB — CBC
HCT: 42.3 % (ref 39.0–52.0)
Hemoglobin: 13.5 g/dL (ref 13.0–17.0)
MCH: 30.6 pg (ref 26.0–34.0)
MCHC: 31.9 g/dL (ref 30.0–36.0)
MCV: 95.9 fL (ref 80.0–100.0)
Platelets: 288 10*3/uL (ref 150–400)
RBC: 4.41 MIL/uL (ref 4.22–5.81)
RDW: 12.9 % (ref 11.5–15.5)
WBC: 7.4 10*3/uL (ref 4.0–10.5)
nRBC: 0 % (ref 0.0–0.2)

## 2018-12-31 LAB — BASIC METABOLIC PANEL
Anion gap: 8 (ref 5–15)
BUN: 34 mg/dL — AB (ref 8–23)
CO2: 29 mmol/L (ref 22–32)
Calcium: 8.5 mg/dL — ABNORMAL LOW (ref 8.9–10.3)
Chloride: 97 mmol/L — ABNORMAL LOW (ref 98–111)
Creatinine, Ser: 1.09 mg/dL (ref 0.61–1.24)
GFR calc Af Amer: >60 mL/min (ref >60)
GFR calc non Af Amer: >60 mL/min (ref >60)
Glucose, Bld: 239 mg/dL — ABNORMAL HIGH (ref 70–99)
POTASSIUM: 4 mmol/L (ref 3.5–5.1)
SODIUM: 134 mmol/L — AB (ref 135–145)

## 2019-01-02 ENCOUNTER — Other Ambulatory Visit (HOSPITAL_COMMUNITY)
Admission: RE | Admit: 2019-01-02 | Discharge: 2019-01-02 | Disposition: A | Discharge status: Home / Self Care | Payer: Medicare Other | Source: Skilled Nursing Facility | Attending: *Deleted | Admitting: *Deleted

## 2019-01-03 ENCOUNTER — Encounter: Payer: Self-pay | Admitting: Internal Medicine

## 2019-01-03 ENCOUNTER — Non-Acute Institutional Stay (SKILLED_NURSING_FACILITY): Payer: Medicare Other | Admitting: Internal Medicine

## 2019-01-03 DIAGNOSIS — I610 Nontraumatic intracerebral hemorrhage in hemisphere, subcortical: Secondary | ICD-10-CM | POA: Diagnosis not present

## 2019-01-03 DIAGNOSIS — E119 Type 2 diabetes mellitus without complications: Secondary | ICD-10-CM | POA: Diagnosis not present

## 2019-01-03 DIAGNOSIS — R197 Diarrhea, unspecified: Secondary | ICD-10-CM

## 2019-01-03 DIAGNOSIS — E785 Hyperlipidemia, unspecified: Secondary | ICD-10-CM | POA: Diagnosis not present

## 2019-01-03 DIAGNOSIS — IMO0001 Reserved for inherently not codable concepts without codable children: Secondary | ICD-10-CM

## 2019-01-03 DIAGNOSIS — Z794 Long term (current) use of insulin: Secondary | ICD-10-CM

## 2019-01-03 NOTE — Progress Notes (Signed)
Location:    [place] Room Number: 130/P Place of Service:  SNF [phone]) Provider:  Veleta Miners MD  [name], MD  Patient Care Team: [name], MD as PCP - General (Family Medicine) [name], MD as Consulting Physician (Urology)  Extended Emergency Contact Information Primary Emergency Contact: Marcin,[name] Address: [address]          [place] [address] [name] of [place] Phone: [phone] Mobile Phone: [phone] Relation: Spouse Secondary Emergency Contact: [name] of [place] Phone: [phone] Relation: Son  Code Status:  Full Code Goals of care: Advanced Directive information Advanced Directives 01/03/2019  Does Patient Have a Medical Advance Directive? Yes  Type of Advance Directive (No Data)  Does patient want to make changes to medical advance directive? No - Patient declined  Pre-existing out of facility DNR order (yellow form or pink MOST form) -     Chief Complaint  Patient presents with  . Acute Visit    Acute Diarrhea x 2 days    HPI:  Pt is a 73 y.o. male seen today for an acute visit for Diarrhea for past few days  Patient Has history of  admition in [place] from 12/08-01/10 after sustaining Acutenontraumatic subcortical hemorrhage of left hemisphere  Patient had past medical history of hypertension, diabetes mellitus, hyperlipidemia and history of prostatehypertrophy, CAD  Most of the Past history was obtained by his wife and medical records. Patient unable to give  any history. Per his wife patient had sudden onset of right sided weakness and slurred speech. He had a head CT done in [place] which showed acute left thalamic intraparenchymal hemorrhage. He was transferred to [facility]. His repeat CT showed large intraparenchymal hemorrhage with edema and midline shift. He was managed medically and conservatively. Includingtreatment with mannitol and hypertonic  saline. Treated for UTI and aspiration pneumonia. Due to recurrent fevers and aspiration he had PEG tube placement. Also was found to have gastric ulcer which was injected.  Patient alsowasvery lethargic and was started on Provigil. Prozac was also added for neuro activation. Patient also had urinary retention. He failed number of voiding trials. Was evaluated by urology which recommended to continue Foley catheter for now. Since been in the facility patient has been very restless. He was taken off Provigil And nurses have been using Ativan PRN. He takes his dressing off. Removes his Diaper and Clothes. He stays incoherent.  He is on Tube feed and has been having diarrhea for past few days. His C Diff has been Negative Does not seem like he is having Abdominal Pain  Past Medical History:  Diagnosis Date  . A-fib ([place])    a. Post-op afib after CABG 08/2012.  [name] CAD (coronary artery disease)    a. NSTEMI s/p stent to distal RCA 03/2000. b. Inf MI s/p emergent thrombectomy/stenting mid RCA 10/2000. c. NSTEMI s/p CABGx4 (LIMA-LAD, SVG-OM2, seq SVG-acute marginal and PD) 04/13/96 - course complicated by confusion, post-op AF, L pleural effusion with thoracentesis. d. Normal LV function by echo 08/2012.  . Diabetes mellitus   . GERD (gastroesophageal reflux disease)   . Hiatal hernia   . HLD (hyperlipidemia)   . HTN (hypertension)   . Peripheral vascular disease ([place])    a. Carotid dopplers neg 08/13/12. b. Pre-cabg ABIs - R=0.87 suggesting mild dz, L=1.29 possibly falsely elevated due to calcified vessels.  . Pleural effusion    a. L pleural eff after CABG s/p thoracentesis 08/22/12.  [name]  Prostate cancer [facility])    Status post radiation treatment.  . Prostatic hypertrophy    a. Hx of urinary retention, awaiting TURP.  . Tobacco abuse   . Valvular heart disease    a. Mild  MR by TEE 08/2012.   Past Surgical History:  Procedure Laterality Date  . APPENDECTOMY    . BACK SURGERY    . CORONARY  ARTERY BYPASS GRAFT  08/16/2012   Procedure: CORONARY ARTERY BYPASS GRAFTING (CABG);  Surgeon: Melrose Nakayama, MD;  Location: [place];  Service: Open Heart Surgery;  Laterality: N/A;  . LEFT HEART CATHETERIZATION WITH CORONARY ANGIOGRAM N/A 08/14/2012   Procedure: LEFT HEART CATHETERIZATION WITH CORONARY ANGIOGRAM;  Surgeon: Peter M Martinique, MD;  Location: [facility] CATH LAB;  Service: Cardiovascular;  Laterality: N/A;    No Known Allergies  Outpatient Encounter Medications as of 01/03/2019  Medication Sig  . Amino Acids-Protein Hydrolys (FEEDING SUPPLEMENT, PRO-STAT SUGAR FREE 64,) LIQD Place 30 mLs into feeding tube daily.  [name] amLODipine (NORVASC) 10 MG tablet Give once a day per gastric tube  . atorvastatin (LIPITOR) 80 MG tablet Take 1 tablet (80 mg total) by mouth daily.  . collagenase (SANTYL) ointment Apply 1 application topically daily. Apply to sacrum wound per tx order.  . Dimethicone 1.3 % LOTN Apply to the extremities and back daily and prn for itching /dry skin do not apply to buttocks wound.  [name] FLUoxetine (PROZAC) 20 MG/5ML solution Place 30 mg into feeding tube daily.  . insulin aspart (NOVOLOG FLEXPEN) 100 UNIT/ML FlexPen Give per sliding scale every 6 hours  . insulin glargine (LANTUS) 100 UNIT/ML injection Inject 50 Units into the skin daily.  . lansoprazole (PREVACID) 30 MG capsule Take 30 mg by mouth 2 (two) times daily.  [name] LORazepam (ATIVAN) 0.5 MG tablet Take 1 tablet (0.5 mg total) by mouth every 6 (six) hours as needed for anxiety.  . Melatonin 1 MG TABS 2 mg by Feeding Tube route at bedtime.  . metoprolol tartrate (LOPRESSOR) 50 MG tablet Place 50 mg into feeding tube 2 (two) times daily.  . QUEtiapine (SEROQUEL) 25 MG tablet Take 12.5 mg by mouth 2 (two) times daily.  . [DISCONTINUED] acetaminophen (TYLENOL) 325 MG tablet Take 650 mg by mouth every 6 (six) hours as needed.  . [DISCONTINUED] hydrocortisone (ANUSOL-HC) 25 MG suppository Place 25 mg rectally 2 (two) times daily.    . [DISCONTINUED] metFORMIN (GLUCOPHAGE-XR) 500 MG 24 hr tablet 1,000 mg 2 (two) times daily. Give by gastric tube   No facility-administered encounter medications on file as of 01/03/2019.      Review of Systems  Unable to perform ROS: Other     There is no immunization history on file for this patient. Pertinent  Health Maintenance Due  Topic Date Due  . INFLUENZA VACCINE  01/24/2019 (Originally 07/12/2018)  . FOOT EXAM  01/24/2019 (Originally 06/14/1956)  . HEMOGLOBIN A1C  01/24/2019 (Originally 02/10/2013)  . OPHTHALMOLOGY EXAM  01/24/2019 (Originally 06/14/1956)  . URINE MICROALBUMIN  01/24/2019 (Originally 06/14/1956)  . COLONOSCOPY  01/24/2019 (Originally 06/14/1994)  . PNA vac Low Risk Adult (1 of 2 - PCV13) 01/24/2019 (Originally 06/14/2009)   No flowsheet data found. Functional Status Survey:    Vitals:   01/03/19 1105  BP: (!) 135/94  Pulse: 75  Resp: 20  Temp: 97.6 F (36.4 C)  TempSrc: Oral   There is no height or weight on file to calculate BMI. Physical Exam Vitals signs reviewed.  Constitutional:  Appearance: Normal appearance.  HENT:     Head: Normocephalic.     Nose: Nose normal.     Mouth/Throat:     Comments: Mild Thrush Eyes:     Pupils: Pupils are equal, round, and reactive to light.  Neck:     Musculoskeletal: Neck supple.  Cardiovascular:     Rate and Rhythm: Normal rate and regular rhythm.     Pulses: Normal pulses.     Heart sounds: Normal heart sounds.  Pulmonary:     Effort: Pulmonary effort is normal. No respiratory distress.     Breath sounds: Normal breath sounds. No wheezing or rales.  Abdominal:     General: Abdomen is flat. Bowel sounds are normal. There is no distension.     Palpations: Abdomen is soft.     Tenderness: There is no abdominal tenderness. There is no guarding.  Musculoskeletal:        General: No swelling.  Skin:    General: Skin is warm.     Comments: Has Stage 3 Sacral pressure Ulcer with necrosis  Neurological:      Mental Status: He is alert.     Comments: Patient has Right UE hemiparesis. Does move his LE involuntarily. Good Strength in Left Upper and Lower extremities. Aphasic. Does not follow commands. No sensible words.  Psychiatric:     Comments: Cannot assess     Labs reviewed: Recent Labs    12/24/18 0850 12/31/18 0640  NA 137 134*  K 4.1 4.0  CL 99 97*  CO2 30 29  GLUCOSE 298* 239*  BUN 37* 34*  CREATININE 1.10 1.09  CALCIUM 8.8* 8.5*   No results for input(s): AST, ALT, ALKPHOS, BILITOT, PROT, ALBUMIN in the last 8760 hours. Recent Labs    12/24/18 0850 12/31/18 0640  WBC 7.4 7.4  NEUTROABS 5.0  --   HGB 13.7 13.5  HCT 43.7 42.3  MCV 94.6 95.9  PLT 271 288   No results found for: TSH Lab Results  Component Value Date   HGBA1C 7.6 (H) 08/13/2012   Lab Results  Component Value Date   CHOL 141 08/14/2012   HDL 33 (L) 08/14/2012   LDLCALC 78 08/14/2012   TRIG 151 (H) 08/14/2012   CHOLHDL 4.3 08/14/2012    Significant Diagnostic Results in last 30 days:  No results found.  Assessment/Plan  Diarrhea Metformin was discontinued. C Diff Negative Will send stool for GI pathogen D/w the Dietician and will change his Tube feed to Jevity. Lomotil PRN. I will also decrease the Prozac to 20 mg Nontraumatic subcortical hemorrhage of left cerebral hemisphere Severe Neuro Deficits Discontinued Provigil as he was very anxious and restless Continue Ativan as needed for agitation  on Seroquel 12.5 mg BID. Increase in few weeks D/W therapy Patient not making much progress. Continues to be dependent and does not follow Commands Prognosis Poor IDDM (insulin dependent diabetes mellitus) A1C was more then 9 in hospital But since being on Lantus BS are running Less then 200 right now  Essential hypertension BP Stable Continue to monitor On Norvasc and metroprolol  Wound of buttock, D/w wound care Santyl and Covaderm It is little better. Still has  [facility] Urinary retention Continue Foley for now. Will help his wound also Urology follow up pending PEG Tube Feeding NPO for Now Speech to follow  Glycerna changed to Jevity Gastric Ulcer On Protonix Follow with Gastro Hyperlipidemia On Atrovastatin   Family/ staff Communication:   Labs/tests ordered:

## 2019-01-04 ENCOUNTER — Encounter: Payer: Self-pay | Admitting: Internal Medicine

## 2019-01-04 ENCOUNTER — Encounter (HOSPITAL_COMMUNITY): Payer: Self-pay

## 2019-01-04 ENCOUNTER — Non-Acute Institutional Stay (SKILLED_NURSING_FACILITY): Payer: Medicare Other | Admitting: Internal Medicine

## 2019-01-04 ENCOUNTER — Encounter (HOSPITAL_COMMUNITY): Payer: Self-pay | Admitting: Family Medicine

## 2019-01-04 ENCOUNTER — Other Ambulatory Visit: Payer: Self-pay

## 2019-01-04 ENCOUNTER — Inpatient Hospital Stay (HOSPITAL_COMMUNITY)
Admission: EM | Admit: 2019-01-04 | Discharge: 2019-01-09 | DRG: 698 | Disposition: A | Discharge status: Skilled Nursing Facility | Payer: Medicare Other | Attending: Internal Medicine | Admitting: Internal Medicine

## 2019-01-04 ENCOUNTER — Emergency Department (HOSPITAL_COMMUNITY): Payer: Medicare Other

## 2019-01-04 DIAGNOSIS — N390 Urinary tract infection, site not specified: Secondary | ICD-10-CM | POA: Diagnosis not present

## 2019-01-04 DIAGNOSIS — Z6821 Body mass index (BMI) 21.0-21.9, adult: Secondary | ICD-10-CM

## 2019-01-04 DIAGNOSIS — N183 Chronic kidney disease, stage 3 (moderate): Secondary | ICD-10-CM | POA: Diagnosis present

## 2019-01-04 DIAGNOSIS — Z431 Encounter for attention to gastrostomy: Secondary | ICD-10-CM | POA: Diagnosis not present

## 2019-01-04 DIAGNOSIS — E1165 Type 2 diabetes mellitus with hyperglycemia: Secondary | ICD-10-CM | POA: Diagnosis present

## 2019-01-04 DIAGNOSIS — E870 Hyperosmolality and hypernatremia: Secondary | ICD-10-CM | POA: Diagnosis present

## 2019-01-04 DIAGNOSIS — I6930 Unspecified sequelae of cerebral infarction: Secondary | ICD-10-CM

## 2019-01-04 DIAGNOSIS — I10 Essential (primary) hypertension: Secondary | ICD-10-CM | POA: Diagnosis not present

## 2019-01-04 DIAGNOSIS — I610 Nontraumatic intracerebral hemorrhage in hemisphere, subcortical: Secondary | ICD-10-CM | POA: Diagnosis not present

## 2019-01-04 DIAGNOSIS — E119 Type 2 diabetes mellitus without complications: Secondary | ICD-10-CM

## 2019-01-04 DIAGNOSIS — Z951 Presence of aortocoronary bypass graft: Secondary | ICD-10-CM

## 2019-01-04 DIAGNOSIS — Z87891 Personal history of nicotine dependence: Secondary | ICD-10-CM

## 2019-01-04 DIAGNOSIS — K6289 Other specified diseases of anus and rectum: Secondary | ICD-10-CM | POA: Diagnosis present

## 2019-01-04 DIAGNOSIS — T83511A Infection and inflammatory reaction due to indwelling urethral catheter, initial encounter: Secondary | ICD-10-CM

## 2019-01-04 DIAGNOSIS — R1110 Vomiting, unspecified: Secondary | ICD-10-CM | POA: Diagnosis not present

## 2019-01-04 DIAGNOSIS — R471 Dysarthria and anarthria: Secondary | ICD-10-CM | POA: Diagnosis present

## 2019-01-04 DIAGNOSIS — I129 Hypertensive chronic kidney disease with stage 1 through stage 4 chronic kidney disease, or unspecified chronic kidney disease: Secondary | ICD-10-CM | POA: Diagnosis present

## 2019-01-04 DIAGNOSIS — Z8673 Personal history of transient ischemic attack (TIA), and cerebral infarction without residual deficits: Secondary | ICD-10-CM

## 2019-01-04 DIAGNOSIS — I493 Ventricular premature depolarization: Secondary | ICD-10-CM | POA: Diagnosis present

## 2019-01-04 DIAGNOSIS — E785 Hyperlipidemia, unspecified: Secondary | ICD-10-CM | POA: Diagnosis present

## 2019-01-04 DIAGNOSIS — Y846 Urinary catheterization as the cause of abnormal reaction of the patient, or of later complication, without mention of misadventure at the time of the procedure: Secondary | ICD-10-CM | POA: Diagnosis present

## 2019-01-04 DIAGNOSIS — IMO0001 Reserved for inherently not codable concepts without codable children: Secondary | ICD-10-CM

## 2019-01-04 DIAGNOSIS — Z79899 Other long term (current) drug therapy: Secondary | ICD-10-CM

## 2019-01-04 DIAGNOSIS — T83518A Infection and inflammatory reaction due to other urinary catheter, initial encounter: Principal | ICD-10-CM | POA: Diagnosis present

## 2019-01-04 DIAGNOSIS — R4701 Aphasia: Secondary | ICD-10-CM | POA: Diagnosis present

## 2019-01-04 DIAGNOSIS — E1122 Type 2 diabetes mellitus with diabetic chronic kidney disease: Secondary | ICD-10-CM | POA: Diagnosis present

## 2019-01-04 DIAGNOSIS — R4702 Dysphasia: Secondary | ICD-10-CM | POA: Diagnosis present

## 2019-01-04 DIAGNOSIS — Z923 Personal history of irradiation: Secondary | ICD-10-CM

## 2019-01-04 DIAGNOSIS — Z931 Gastrostomy status: Secondary | ICD-10-CM

## 2019-01-04 DIAGNOSIS — Z794 Long term (current) use of insulin: Secondary | ICD-10-CM

## 2019-01-04 DIAGNOSIS — N400 Enlarged prostate without lower urinary tract symptoms: Secondary | ICD-10-CM | POA: Diagnosis present

## 2019-01-04 DIAGNOSIS — I252 Old myocardial infarction: Secondary | ICD-10-CM

## 2019-01-04 DIAGNOSIS — R197 Diarrhea, unspecified: Secondary | ICD-10-CM | POA: Diagnosis not present

## 2019-01-04 DIAGNOSIS — Z8546 Personal history of malignant neoplasm of prostate: Secondary | ICD-10-CM

## 2019-01-04 DIAGNOSIS — L8990 Pressure ulcer of unspecified site, unspecified stage: Secondary | ICD-10-CM

## 2019-01-04 DIAGNOSIS — R410 Disorientation, unspecified: Secondary | ICD-10-CM

## 2019-01-04 DIAGNOSIS — I2510 Atherosclerotic heart disease of native coronary artery without angina pectoris: Secondary | ICD-10-CM | POA: Diagnosis present

## 2019-01-04 DIAGNOSIS — E860 Dehydration: Secondary | ICD-10-CM | POA: Diagnosis present

## 2019-01-04 DIAGNOSIS — Z9049 Acquired absence of other specified parts of digestive tract: Secondary | ICD-10-CM

## 2019-01-04 DIAGNOSIS — E43 Unspecified severe protein-calorie malnutrition: Secondary | ICD-10-CM

## 2019-01-04 DIAGNOSIS — K219 Gastro-esophageal reflux disease without esophagitis: Secondary | ICD-10-CM | POA: Diagnosis present

## 2019-01-04 DIAGNOSIS — B961 Klebsiella pneumoniae [K. pneumoniae] as the cause of diseases classified elsewhere: Secondary | ICD-10-CM | POA: Diagnosis present

## 2019-01-04 DIAGNOSIS — L89153 Pressure ulcer of sacral region, stage 3: Secondary | ICD-10-CM | POA: Diagnosis present

## 2019-01-04 DIAGNOSIS — Z1612 Extended spectrum beta lactamase (ESBL) resistance: Secondary | ICD-10-CM | POA: Diagnosis present

## 2019-01-04 DIAGNOSIS — N179 Acute kidney failure, unspecified: Secondary | ICD-10-CM | POA: Diagnosis present

## 2019-01-04 DIAGNOSIS — I4891 Unspecified atrial fibrillation: Secondary | ICD-10-CM | POA: Diagnosis present

## 2019-01-04 LAB — CBC
HCT: 45.5 % (ref 39.0–52.0)
Hemoglobin: 14.4 g/dL (ref 13.0–17.0)
MCH: 29.8 pg (ref 26.0–34.0)
MCHC: 31.6 g/dL (ref 30.0–36.0)
MCV: 94 fL (ref 80.0–100.0)
Platelets: 334 10*3/uL (ref 150–400)
RBC: 4.84 MIL/uL (ref 4.22–5.81)
RDW: 12.9 % (ref 11.5–15.5)
WBC: 12.6 10*3/uL — ABNORMAL HIGH (ref 4.0–10.5)
nRBC: 0 % (ref 0.0–0.2)

## 2019-01-04 LAB — CBG MONITORING, ED
Glucose-Capillary: 84 mg/dL (ref 70–99)
Glucose-Capillary: 81 mg/dL (ref 70–99)

## 2019-01-04 LAB — COMPREHENSIVE METABOLIC PANEL
ALT: 47 U/L — ABNORMAL HIGH (ref 0–44)
AST: 33 U/L (ref 15–41)
Albumin: 2.7 g/dL — ABNORMAL LOW (ref 3.5–5.0)
Alkaline Phosphatase: 132 U/L — ABNORMAL HIGH (ref 38–126)
Anion gap: 7 (ref 5–15)
BUN: 42 mg/dL — ABNORMAL HIGH (ref 8–23)
CO2: 28 mmol/L (ref 22–32)
Calcium: 8.6 mg/dL — ABNORMAL LOW (ref 8.9–10.3)
Chloride: 104 mmol/L (ref 98–111)
Creatinine, Ser: 1.15 mg/dL (ref 0.61–1.24)
GFR calc Af Amer: >60 mL/min (ref >60)
GFR calc non Af Amer: >60 mL/min (ref >60)
Glucose, Bld: 207 mg/dL — ABNORMAL HIGH (ref 70–99)
POTASSIUM: 4.1 mmol/L (ref 3.5–5.1)
Sodium: 139 mmol/L (ref 135–145)
Total Bilirubin: 0.8 mg/dL (ref 0.3–1.2)
Total Protein: 6.6 g/dL (ref 6.5–8.1)

## 2019-01-04 LAB — LACTIC ACID, PLASMA
Lactic Acid, Venous: 1.2 mmol/L (ref 0.5–1.9)
Lactic Acid, Venous: 1.4 mmol/L (ref 0.5–1.9)

## 2019-01-04 LAB — LIPASE, BLOOD: Lipase: 43 U/L (ref 11–51)

## 2019-01-04 LAB — URINALYSIS, ROUTINE W REFLEX MICROSCOPIC
Bilirubin Urine: NEGATIVE
Glucose, UA: 150 mg/dL — AB
KETONES UR: NEGATIVE mg/dL
Nitrite: POSITIVE — AB
Protein, ur: >=300 mg/dL — AB
Specific Gravity, Urine: 1.019 (ref 1.005–1.030)
pH: 5 (ref 5.0–8.0)

## 2019-01-04 MED ORDER — QUETIAPINE FUMARATE 25 MG PO TABS: 12.5000 mg | ORAL_TABLET | Freq: Two times a day (BID) | ORAL | Status: DC

## 2019-01-04 MED ORDER — INSULIN GLARGINE 100 UNIT/ML ~~LOC~~ SOLN
20.0000 [IU] | Freq: Every day | SUBCUTANEOUS | Status: DC
  Administered 2019-01-05 – 2019-01-09 (×5): 20 [IU] via SUBCUTANEOUS
  Filled 2019-01-04 (×6): qty 0.2

## 2019-01-04 MED ORDER — INSULIN ASPART 100 UNIT/ML ~~LOC~~ SOLN
0.0000 [IU] | Freq: Three times a day (TID) | SUBCUTANEOUS | Status: DC
  Administered 2019-01-08: 3 [IU] via SUBCUTANEOUS
  Administered 2019-01-08: 1 [IU] via SUBCUTANEOUS
  Administered 2019-01-08 – 2019-01-09 (×3): 2 [IU] via SUBCUTANEOUS

## 2019-01-04 MED ORDER — IOPAMIDOL (ISOVUE-300) INJECTION 61%
100.0000 mL | Freq: Once | INTRAVENOUS | Status: AC | PRN
  Administered 2019-01-04: 100 mL via INTRAVENOUS

## 2019-01-04 MED ORDER — INSULIN ASPART 100 UNIT/ML ~~LOC~~ SOLN: 0.0000 [IU] | Freq: Every day | SUBCUTANEOUS | Status: DC

## 2019-01-04 MED ORDER — ACETAMINOPHEN 325 MG PO TABS
650.0000 mg | ORAL_TABLET | Freq: Four times a day (QID) | ORAL | Status: DC | PRN
  Administered 2019-01-05 – 2019-01-06 (×3): 650 mg via ORAL
  Filled 2019-01-04 (×3): qty 2

## 2019-01-04 MED ORDER — ACETAMINOPHEN 650 MG RE SUPP: 650.0000 mg | Freq: Four times a day (QID) | RECTAL | Status: DC | PRN

## 2019-01-04 MED ORDER — SODIUM CHLORIDE 0.9 % IV BOLUS
500.0000 mL | Freq: Once | INTRAVENOUS | Status: AC
  Administered 2019-01-04: 500 mL via INTRAVENOUS

## 2019-01-04 MED ORDER — LORAZEPAM 2 MG/ML IJ SOLN
1.0000 mg | Freq: Once | INTRAMUSCULAR | Status: AC
  Administered 2019-01-04: 1 mg via INTRAVENOUS
  Filled 2019-01-04: qty 1

## 2019-01-04 MED ORDER — METRONIDAZOLE 500 MG PO TABS
500.0000 mg | ORAL_TABLET | Freq: Once | ORAL | Status: AC
  Administered 2019-01-04: 500 mg via ORAL
  Filled 2019-01-04: qty 1

## 2019-01-04 MED ORDER — FLUOXETINE HCL 20 MG/5ML PO SOLN
20.0000 mg | Freq: Every day | ORAL | Status: DC
  Filled 2019-01-04 (×3): qty 5

## 2019-01-04 MED ORDER — ONDANSETRON HCL 4 MG/2ML IJ SOLN
4.0000 mg | Freq: Four times a day (QID) | INTRAMUSCULAR | Status: DC | PRN
  Administered 2019-01-05 – 2019-01-06 (×3): 4 mg via INTRAVENOUS
  Filled 2019-01-04 (×3): qty 2

## 2019-01-04 MED ORDER — SODIUM CHLORIDE 0.9 % IV SOLN
1.0000 g | INTRAVENOUS | Status: DC
  Administered 2019-01-05 – 2019-01-06 (×2): 1 g via INTRAVENOUS
  Filled 2019-01-04 (×2): qty 1
  Filled 2019-01-04: qty 10

## 2019-01-04 MED ORDER — SODIUM CHLORIDE 0.9 % IV SOLN
INTRAVENOUS | Status: AC
  Administered 2019-01-04: 23:00:00 via INTRAVENOUS

## 2019-01-04 MED ORDER — INSULIN GLARGINE 100 UNIT/ML ~~LOC~~ SOLN
30.0000 [IU] | Freq: Every day | SUBCUTANEOUS | Status: DC
  Filled 2019-01-04 (×3): qty 0.3

## 2019-01-04 MED ORDER — METRONIDAZOLE IN NACL 5-0.79 MG/ML-% IV SOLN
500.0000 mg | Freq: Three times a day (TID) | INTRAVENOUS | Status: DC
  Administered 2019-01-05 – 2019-01-07 (×7): 500 mg via INTRAVENOUS
  Filled 2019-01-04 (×8): qty 100

## 2019-01-04 MED ORDER — ONDANSETRON HCL 4 MG PO TABS: 4.0000 mg | ORAL_TABLET | Freq: Four times a day (QID) | ORAL | Status: DC | PRN

## 2019-01-04 MED ORDER — CIPROFLOXACIN IN D5W 400 MG/200ML IV SOLN: 400.0000 mg | Freq: Once | INTRAVENOUS | Status: DC

## 2019-01-04 MED ORDER — QUETIAPINE FUMARATE 25 MG PO TABS
12.5000 mg | ORAL_TABLET | Freq: Two times a day (BID) | ORAL | Status: DC
  Administered 2019-01-04 – 2019-01-09 (×10): 12.5 mg
  Filled 2019-01-04 (×10): qty 1

## 2019-01-04 MED ORDER — SODIUM CHLORIDE 0.9 % IV SOLN
1.0000 g | Freq: Once | INTRAVENOUS | Status: AC
  Administered 2019-01-04: 1 g via INTRAVENOUS
  Filled 2019-01-04: qty 10

## 2019-01-04 MED ORDER — PRO-STAT SUGAR FREE PO LIQD
30.0000 mL | Freq: Every day | ORAL | Status: DC
  Filled 2019-01-04 (×3): qty 30

## 2019-01-04 MED ORDER — ATORVASTATIN CALCIUM 40 MG PO TABS
80.0000 mg | ORAL_TABLET | Freq: Every day | ORAL | Status: DC
  Administered 2019-01-04 – 2019-01-08 (×5): 80 mg
  Filled 2019-01-04 (×5): qty 2

## 2019-01-04 MED ORDER — SODIUM CHLORIDE 0.9% FLUSH: 3.0000 mL | Freq: Once | INTRAVENOUS | Status: DC

## 2019-01-04 MED ORDER — PANTOPRAZOLE SODIUM 40 MG PO PACK
40.0000 mg | PACK | Freq: Two times a day (BID) | ORAL | Status: DC
  Administered 2019-01-04 – 2019-01-07 (×6): 40 mg
  Filled 2019-01-04 (×11): qty 20

## 2019-01-04 MED ORDER — ENOXAPARIN SODIUM 40 MG/0.4ML ~~LOC~~ SOLN
40.0000 mg | SUBCUTANEOUS | Status: DC
  Administered 2019-01-04 – 2019-01-08 (×5): 40 mg via SUBCUTANEOUS
  Filled 2019-01-04 (×5): qty 0.4

## 2019-01-04 MED ORDER — LORAZEPAM 0.5 MG PO TABS
0.5000 mg | ORAL_TABLET | Freq: Four times a day (QID) | ORAL | Status: DC | PRN
  Administered 2019-01-04 – 2019-01-08 (×4): 0.5 mg
  Filled 2019-01-04 (×6): qty 1

## 2019-01-04 MED ORDER — MELATONIN 3 MG PO TABS
3.0000 mg | ORAL_TABLET | Freq: Every day | ORAL | Status: DC
  Administered 2019-01-04 – 2019-01-08 (×5): 3 mg via GASTROSTOMY
  Filled 2019-01-04 (×7): qty 1

## 2019-01-04 NOTE — ED Triage Notes (Signed)
Pt c/o of diarrhea and vomiting, took imodium yesterday with no relief.  Unable to communicate due to previous stroke. CBG 208

## 2019-01-04 NOTE — ED Provider Notes (Signed)
[facility] EMERGENCY DEPARTMENT Provider Note   CSN: [id number] Arrival date & time: 01/04/19  1427     History   Chief Complaint Chief Complaint  Patient presents with  . Emesis    HPI [name] is a 73 y.o. male.  HPI Patient presents from his nursing facility with staff concerns of diarrhea, possibly emesis as well. The patient has a history of stroke, he is largely noncommunicative, level 5 caveat.  Per report the patient has been having increasingly frequent, voluminous diarrhea over the past few days, and seemingly had an episode of vomiting as well. Patient's history is substantial, most notable for intracranial hemorrhage end of last year, with prolonged hospitalization, placement of feeding tube and Foley catheter. Patient also known to have stage III decubitus ulcer No reported new medications at the nursing facility, though there was possibly a change in the supplemental feeding substance.  Chart review performed after the initial evaluation notable for summary of his recent medical course by his primary care team as below:  "Patient was admitted at [facility] for approximately a month and December through early January after sustaining an acute nontraumatic subcortical hemorrhage of the left hemisphere.  He previous had a history of hypertension diabetes hyperlipidemia history of prostate hypertrophy as well as coronary artery disease.  Apparently had a sudden onset of right-sided weakness and slurred speech CT of the head done at [facility] in [place] showed the acute left thalamic intraparenchymal hemorrhage.   He was transferred to [facility] where a repeat CT showed the large hemorrhage with edema and midline shift he was managed medically and conservatively including treated with mannitol and hypertonic solution"  Past Medical History:  Diagnosis Date  . A-fib ([place])    a. Post-op afib after CABG 08/2012.  [name] CAD (coronary artery  disease)    a. NSTEMI s/p stent to distal RCA 03/2000. b. Inf MI s/p emergent thrombectomy/stenting mid RCA 10/2000. c. NSTEMI s/p CABGx4 (LIMA-LAD, SVG-OM2, seq SVG-acute marginal and PD) 05/13/82 - course complicated by confusion, post-op AF, L pleural effusion with thoracentesis. d. Normal LV function by echo 08/2012.  . Diabetes mellitus   . GERD (gastroesophageal reflux disease)   . Hiatal hernia   . HLD (hyperlipidemia)   . HTN (hypertension)   . Peripheral vascular disease ([place])    a. Carotid dopplers neg 08/13/12. b. Pre-cabg ABIs - R=0.87 suggesting mild dz, L=1.29 possibly falsely elevated due to calcified vessels.  . Pleural effusion    a. L pleural eff after CABG s/p thoracentesis 08/22/12.  . Prostate cancer [facility])    Status post radiation treatment.  . Prostatic hypertrophy    a. Hx of urinary retention, awaiting TURP.  . Tobacco abuse   . Valvular heart disease    a. Mild  MR by TEE 08/2012.    Patient Active Problem List   Diagnosis Date Noted  . Acute UTI 01/04/2019  . Proctitis 01/04/2019  . History of hemorrhagic cerebrovascular accident (CVA) with residual deficit 01/04/2019  . Diarrhea 01/03/2019  . Urinary retention 12/24/2018  . Buttock wound 12/16/2018  . IVH (intraventricular hemorrhage) ([place]) 11/18/2018  . Nontraumatic subcortical hemorrhage of left cerebral hemisphere ([place]) 11/18/2018  . CAD (coronary artery disease)   . Peripheral vascular disease ([place])   . A-fib ([place])   . Prostatic hypertrophy   . NSTEMI (non-ST elevated myocardial infarction) ([place]) 08/15/2012  . IDDM (insulin dependent diabetes mellitus) ([place]) 08/15/2012  . HTN (hypertension) 08/15/2012  . Hyperlipidemia  08/15/2012    Past Surgical History:  Procedure Laterality Date  . APPENDECTOMY    . BACK SURGERY    . CORONARY ARTERY BYPASS GRAFT  08/16/2012   Procedure: CORONARY ARTERY BYPASS GRAFTING (CABG);  Surgeon: Melrose Nakayama, MD;  Location: [place];  Service: Open Heart Surgery;   Laterality: N/A;  . LEFT HEART CATHETERIZATION WITH CORONARY ANGIOGRAM N/A 08/14/2012   Procedure: LEFT HEART CATHETERIZATION WITH CORONARY ANGIOGRAM;  Surgeon: Peter M Martinique, MD;  Location: [facility] CATH LAB;  Service: Cardiovascular;  Laterality: N/A;        Home Medications    Prior to Admission medications   Medication Sig Start Date End Date Taking? Authorizing Provider  Amino Acids-Protein Hydrolys (FEEDING SUPPLEMENT, PRO-STAT SUGAR FREE 64,) LIQD Place 30 mLs into feeding tube daily.   Yes [provider]  amLODipine (NORVASC) 10 MG tablet Place 10 mg into feeding tube daily. Give once a day per gastric tube    Yes [provider]  atorvastatin (LIPITOR) 80 MG tablet Place 80 mg into feeding tube at bedtime.   Yes [provider]  collagenase (SANTYL) ointment Apply 1 application topically See admin instructions. Apply to sacrum wound per tx order.    Yes [provider]  Dimethicone 1.3 % LOTN Apply 1 application topically See admin instructions. Apply to the extremities and back daily and as needed for itching /dry skin. Do not apply to buttocks wound.   Yes [provider]  diphenoxylate-atropine (LOMOTIL) 2.5-0.025 MG tablet Place 2 tablets into feeding tube every 4 (four) hours as needed for diarrhea or loose stools.   Yes [provider]  FLUoxetine (PROZAC) 20 MG/5ML solution Place 20 mg into feeding tube daily.    Yes [provider]  insulin aspart (NOVOLOG FLEXPEN) 100 UNIT/ML FlexPen Inject 2-6 Units into the skin See admin instructions. Give per sliding scale every 6 hours; If blood sugars are less than 60, CALL MD 200-250= 2 units 251-300= 4 units 301-351= 6 units If blood sugars are greater than 351, Give 0 units, CALL MD   Yes [provider]  insulin glargine (LANTUS) 100 UNIT/ML injection Inject 50 Units into the skin daily.   Yes [provider]  lansoprazole (PREVACID SOLUTAB) 30 MG  disintegrating tablet Take 30 mg by mouth 2 (two) times daily. *Administered via gastric tube   Yes [provider]  Lidocaine-Glycerin (PREPARATION H) 5-14.4 % CREA Place 1 application rectally 2 (two) times daily. Preparation H: May also be applied as needed   Yes [provider]  LORazepam (ATIVAN) 0.5 MG tablet Take 1 tablet (0.5 mg total) by mouth every 6 (six) hours as needed for anxiety. Patient taking differently: Place 0.5 mg into feeding tube every 6 (six) hours as needed for anxiety.  12/26/18  Yes Lassen, Arlo C, PA-C  Melatonin 1 MG TABS 2 mg by Feeding Tube route at bedtime.   Yes [provider]  metoprolol tartrate (LOPRESSOR) 50 MG tablet Place 50 mg into feeding tube 2 (two) times daily.   Yes [provider]  QUEtiapine (SEROQUEL) 25 MG tablet Take 12.5 mg by mouth 2 (two) times daily.   Yes [provider]    Family History History reviewed. No pertinent family history.  Social History Social History   Tobacco Use  . Smoking status: Former Smoker    Last attempt to quit: 11/12/1972    Years since quitting: 46.1  . Smokeless tobacco: Never Used  Substance Use  Topics  . Alcohol use: No  . Drug use: No     Allergies   Patient has no known allergies.   Review of Systems Review of Systems  Unable to perform ROS: Mental status change     Physical Exam Updated Vital Signs BP 121/82   Pulse 81   Resp 20   Ht 6' (1.829 m)   Wt 76.4 kg   SpO2 95%   BMI 22.84 kg/m   Physical Exam Vitals signs and nursing note reviewed.  Constitutional:      Appearance: He is well-developed.     Comments: Unwell appearing elderly male nonverbal, not following of commands.  HENT:     Head: Normocephalic and atraumatic.  Eyes:     Conjunctiva/sclera: Conjunctivae normal.  Cardiovascular:     Rate and Rhythm: Normal rate and regular rhythm.  Pulmonary:     Effort: Pulmonary effort is normal. No respiratory distress.     Breath  sounds: No stridor.  Abdominal:     General: There is no distension.    Skin:    General: Skin is warm and dry.     Comments: III decub ulcer w surrounding erythema, no purulence  Neurological:     Comments: Patient does not follow commands, does not speak spontaneously, has apparent flaccidity of the right side  Psychiatric:        Cognition and Memory: Cognition is impaired.      ED Treatments / Results  Labs (all labs ordered are listed, but only abnormal results are displayed) Labs Reviewed  COMPREHENSIVE METABOLIC PANEL - Abnormal; Notable for the following components:      Result Value   Glucose, Bld 207 (*)    BUN 42 (*)    Calcium 8.6 (*)    Albumin 2.7 (*)    ALT 47 (*)    Alkaline Phosphatase 132 (*)    All other components within normal limits  CBC - Abnormal; Notable for the following components:   WBC 12.6 (*)    All other components within normal limits  URINALYSIS, ROUTINE W REFLEX MICROSCOPIC - Abnormal; Notable for the following components:   Color, Urine AMBER (*)    APPearance CLOUDY (*)    Glucose, UA 150 (*)    Hgb urine dipstick SMALL (*)    Protein, ur >=300 (*)    Nitrite POSITIVE (*)    Leukocytes, UA MODERATE (*)    Bacteria, UA MANY (*)    All other components within normal limits  URINE CULTURE  LIPASE, BLOOD  LACTIC ACID, PLASMA  LACTIC ACID, PLASMA  BASIC METABOLIC PANEL  CBC WITH DIFFERENTIAL/PLATELET    EKG EKG Interpretation  Date/Time:  Friday January 04 2019 14:47:39 EST Ventricular Rate:  74 PR Interval:    QRS Duration: 118 QT Interval:  382 QTC Calculation: 424 R Axis:   74 Text Interpretation:  Sinus tachycardia Paired ventricular premature complexes Nonspecific intraventricular conduction delay T wave abnormality Artifact Abnormal ekg Confirmed by Carmin Muskrat [phone]) on 01/04/2019 2:56:48 PM   Radiology Ct Head Wo Contrast  Result Date: 01/04/2019 CLINICAL DATA:  Initial evaluation for acute altered mental  status. EXAM: CT HEAD WITHOUT CONTRAST TECHNIQUE: Contiguous axial images were obtained from the base of the skull through the vertex without intravenous contrast. COMPARISON:  Prior CT from 11/17/2018. FINDINGS: Brain: Age-related cerebral atrophy with mild chronic small vessel ischemic disease, stable. There has been interval evolution of previously identified left thalamic hemorrhage, now late subacute in appearance  with decreased size and attenuation as from previous. Mild residual subacute blood products measure approximately 2.0 x 1.6 cm. Residual surrounding vasogenic edema within this region with mild mass effect on the adjacent left lateral ventricle. Trace residual left-to-right midline shift. Previously seen intraventricular hemorrhage has largely resolved. Overall ventricular size mildly increased without frank hydrocephalus. Basilar cisterns remain patent. No other acute intracranial hemorrhage. No acute large vessel territory infarct. No other mass lesion. No extra-axial fluid collection. Vascular: No hyperdense vessel. Scattered vascular calcifications noted within the carotid siphons. Skull: Scalp soft tissues and calvarium within normal limits. Sinuses/Orbits: Globes orbital soft tissues demonstrate no acute finding. Mild scattered mucosal thickening within the ethmoidal air cells. Visualized paranasal sinuses are otherwise clear. No mastoid effusion. Other: None. IMPRESSION: 1. Normal expected interval evolution of late subacute left thalamic hemorrhage. Associated intraventricular hemorrhage has resolved. Overall ventricular size mildly increased from previous without frank hydrocephalus. 2. No other acute intracranial abnormality. 3. Underlying age-related cerebral atrophy with chronic small vessel ischemic disease. Electronically Signed   By: Jeannine Boga M.D.   On: 01/04/2019 18:16   Ct Abdomen Pelvis W Contrast  Result Date: 01/04/2019 CLINICAL DATA:  Initial evaluation for acute  nausea, vomiting, diarrhea. EXAM: CT ABDOMEN AND PELVIS WITH CONTRAST TECHNIQUE: Multidetector CT imaging of the abdomen and pelvis was performed using the standard protocol following bolus administration of intravenous contrast. CONTRAST:  188mL ISOVUE-300 IOPAMIDOL (ISOVUE-300) INJECTION 61% COMPARISON:  Prior CT from 11/14/2014. FINDINGS: Lower chest: Scattered subsegmental atelectatic changes present within the visualized lung bases. Trace left pleural effusion. Visualized lungs are otherwise grossly clear. Hepatobiliary: Irregular somewhat linear hypodensity within the central aspect of the right hepatic lobe noted, indeterminate, but stable from previous, and of doubtful significance given stability. Liver otherwise within normal limits. Gallbladder surgically absent. No biliary dilatation. Pancreas: Pancreas grossly within normal limits. Spleen: Unremarkable. Adrenals/Urinary Tract: Adrenal glands are normal. Kidneys equal in size with symmetric enhancement. No nephrolithiasis, hydronephrosis, or focal enhancing renal mass. No appreciable hydroureter. Bladder decompressed with a Foley catheter in place. Gas lucency within the bladder lumen compatible with catheterization. Stomach/Bowel: Percutaneous gastrostomy tube in place within the stomach. No adverse features. Focal diverticulum arising from the second portion of the duodenum, grossly similar to previous. No obvious associated inflammation on this motion degraded exam. No evidence for bowel obstruction. Colonic diverticulosis without evidence for acute diverticulitis. Circumferential wall thickening with edema about the rectosigmoid colon, suggesting acute proctitis. Hazy inflammatory stranding within the adjacent perirectal fat. No evidence for perforation or other complication. Vascular/Lymphatic: Normal intravascular enhancement seen throughout the intra-abdominal aorta. Moderate atherosclerotic change. No aneurysm. Mesenteric vessels patent  proximally. No adenopathy. Reproductive: Prostate within normal limits. Other: Small fat containing left inguinal hernia noted without associated inflammation. No free air or fluid. Musculoskeletal: No acute osseous abnormality. No discrete lytic or blastic osseous lesions. IMPRESSION: 1. Circumferential wall thickening and edema about the rectosigmoid colon, suggesting acute proctitis. No complication. 2. Colonic diverticulosis without evidence for acute diverticulitis. 3. Percutaneous gastrostomy tube in place without adverse features. 4. Trace left pleural effusion. Aortic Atherosclerosis (ICD10-I70.0). Electronically Signed   By: Jeannine Boga M.D.   On: 01/04/2019 18:27    Procedures Procedures (including critical care time)  Medications Ordered in ED Medications  sodium chloride flush (NS) 0.9 % injection 3 mL (3 mLs Intravenous Not Given 01/04/19 1502)  atorvastatin (LIPITOR) tablet 80 mg (has no administration in time range)  FLUoxetine (PROZAC) 20 MG/5ML solution 20 mg (has no administration in time  range)  LORazepam (ATIVAN) tablet 0.5 mg (has no administration in time range)  pantoprazole sodium (PROTONIX) 40 mg/20 mL oral suspension 40 mg (has no administration in time range)  Melatonin TABS 3 mg (has no administration in time range)  feeding supplement (PRO-STAT SUGAR FREE 64) liquid 30 mL (has no administration in time range)  enoxaparin (LOVENOX) injection 40 mg (has no administration in time range)  0.9 %  sodium chloride infusion (has no administration in time range)  acetaminophen (TYLENOL) tablet 650 mg (has no administration in time range)    Or  acetaminophen (TYLENOL) suppository 650 mg (has no administration in time range)  ondansetron (ZOFRAN) tablet 4 mg (has no administration in time range)    Or  ondansetron (ZOFRAN) injection 4 mg (has no administration in time range)  insulin glargine (LANTUS) injection 30 Units (has no administration in time range)  insulin  aspart (novoLOG) injection 0-9 Units (has no administration in time range)  insulin aspart (novoLOG) injection 0-5 Units (has no administration in time range)  cefTRIAXone (ROCEPHIN) 1 g in sodium chloride 0.9 % 100 mL IVPB (has no administration in time range)  metroNIDAZOLE (FLAGYL) IVPB 500 mg (has no administration in time range)  QUEtiapine (SEROQUEL) tablet 12.5 mg (has no administration in time range)  sodium chloride 0.9 % bolus 500 mL (0 mLs Intravenous Stopped 01/04/19 1654)  cefTRIAXone (ROCEPHIN) 1 g in sodium chloride 0.9 % 100 mL IVPB (0 g Intravenous Stopped 01/04/19 1725)  LORazepam (ATIVAN) injection 1 mg (1 mg Intravenous Given 01/04/19 1629)  iopamidol (ISOVUE-300) 61 % injection 100 mL (100 mLs Intravenous Contrast Given 01/04/19 1753)  metroNIDAZOLE (FLAGYL) tablet 500 mg (500 mg Oral Given 01/04/19 2028)     Initial Impression / Assessment and Plan / ED Course  I have reviewed the triage vital signs and the nursing notes.  Pertinent labs & imaging results that were available during my care of the patient were reviewed by me and considered in my medical decision making (see chart for details).    Update:, Patient is agitated, consistent with nursing home notes, requiring Ativan. Patient will be provided this medication.  Update: Initial findings discussed the patient's wife who has arrived. With concern for urinary tract infection, he has received ceftriaxone, other initial labs slightly reassuring. Patient is awaiting CT scan.  Update: CT scan of the brain unremarkable, consistent with recovery from recent hemorrhagic stroke. CT of the abdomen and pelvis notable for findings consistent with acute proctitis, which is likely contributing to the patient's bowel movements. Patient has received ceftriaxone, and with concern for enteric infection, patient will receive ciprofloxacin, Flagyl as well. Patient has remained calm since his dosing of Ativan. Given findings  concerning for UTI, proctitis, altered mental status, patient was admitted for further evaluation and management.   Final Clinical Impressions(s) / ED Diagnoses   Final diagnoses:  Urinary tract infection associated with indwelling urethral catheter, initial encounter ([place])  Proctitis  Delirium     Carmin Muskrat, MD 01/04/19 2116

## 2019-01-04 NOTE — ED Notes (Signed)
Pt resident of [facility].  REports history of stroke and has feeding tube.  Pt had diarrhea yesterday, they gave immodium and changed his tube feeding.  Reports started having diarrhea again today, restless and scratching sacral wound.  Reports pt can't communicate due to old stroke.

## 2019-01-04 NOTE — Progress Notes (Signed)
Location:    [place] Room Number: 130/P Place of Service:  SNF [phone]) Provider:  Janeal Holmes, MD  Patient Care Team: [name], MD as PCP - General (Family Medicine) [name], MD as Consulting Physician (Urology)  Extended Emergency Contact Information Primary Emergency Contact: Deloney,[name] Address: [address]          [place] [address] [name] of [place] Phone: [phone] Mobile Phone: [phone] Relation: Spouse Secondary Emergency Contact: [name] of [place] Phone: [phone] Relation: Son  Code Status:  Full Code Goals of care: Advanced Directive information Advanced Directives 01/04/2019  Does Patient Have a Medical Advance Directive? Yes  Type of Advance Directive (No Data)  Does patient want to make changes to medical advance directive? No - Patient declined  Pre-existing out of facility DNR order (yellow form or pink MOST form) -     Chief Complaint  Patient presents with  . Acute Visit    Chronic Diarrhea  And now vomiting of tube feedings  HPI:  Pt is a 73 y.o. male seen today for an acute visit for follow-up of GI issues with vomiting of his tube feedings as well as unremitting diarrhea.  Patient was admitted at [facility] for approximately a month and December through early January after sustaining an acute nontraumatic subcortical hemorrhage of the left hemisphere.  He previous had a history of hypertension diabetes hyperlipidemia history of prostate hypertrophy as well as coronary artery disease.  Apparently had a sudden onset of right-sided weakness and slurred speech CT of the head done at [facility] in [place] showed the acute left thalamic intraparenchymal hemorrhage.   He was transferred to [facility] where a repeat CT showed the large hemorrhage with edema and midline shift he was managed medically and conservatively including treated with  mannitol and hypertonic solution Was also lethargic and started on Provigil Prozac also was added.  He also had urinary retention failed voiding trials has a Foley catheter.  Patient had persistent restless since he has been here in skilled nursing and was taken off the Provigil.  Apparently he takes his dressing off and removes his diaper and closed.  He has been incoherent.  Apparently he has had some diarrhea for the last several days but according to nursing this is increasing and now is pretty much constant today.  He did have a C. difficile culture in the past which was negative.  Stool cultures have been obtained but those lab results are not available yet.  T  Vital signs appear to be relatively stable but there are concerns that he has developed a progressing GI issue with the persistent diarrhea now and now vomiting of his tube feedings He has questionable abdominal pain at times when I press his abdomen he does have significant grimacing but this is not persistent on repeat exam.  He has not been coughing and does not show any signs of respiratory compromise-    Past Medical History:  Diagnosis Date  . A-fib ([place])    a. Post-op afib after CABG 08/2012.  [name] CAD (coronary artery disease)    a. NSTEMI s/p stent to distal RCA 03/2000. b. Inf MI s/p emergent thrombectomy/stenting mid RCA 10/2000. c. NSTEMI s/p CABGx4 (LIMA-LAD, SVG-OM2, seq SVG-acute marginal and PD) 08/16/17 - course complicated by confusion, post-op AF, L pleural effusion with thoracentesis. d. Normal LV function by echo 08/2012.  . Diabetes mellitus   .  GERD (gastroesophageal reflux disease)   . Hiatal hernia   . HLD (hyperlipidemia)   . HTN (hypertension)   . Peripheral vascular disease ([place])    a. Carotid dopplers neg 08/13/12. b. Pre-cabg ABIs - R=0.87 suggesting mild dz, L=1.29 possibly falsely elevated due to calcified vessels.  . Pleural effusion    a. L pleural eff after CABG s/p thoracentesis 08/22/12.   . Prostate cancer [facility])    Status post radiation treatment.  . Prostatic hypertrophy    a. Hx of urinary retention, awaiting TURP.  . Tobacco abuse   . Valvular heart disease    a. Mild  MR by TEE 08/2012.   Past Surgical History:  Procedure Laterality Date  . APPENDECTOMY    . BACK SURGERY    . CORONARY ARTERY BYPASS GRAFT  08/16/2012   Procedure: CORONARY ARTERY BYPASS GRAFTING (CABG);  Surgeon: Melrose Nakayama, MD;  Location: [place];  Service: Open Heart Surgery;  Laterality: N/A;  . LEFT HEART CATHETERIZATION WITH CORONARY ANGIOGRAM N/A 08/14/2012   Procedure: LEFT HEART CATHETERIZATION WITH CORONARY ANGIOGRAM;  Surgeon: Peter M Martinique, MD;  Location: [facility] CATH LAB;  Service: Cardiovascular;  Laterality: N/A;    No Known Allergies  Outpatient Encounter Medications as of 01/04/2019  Medication Sig  . Amino Acids-Protein Hydrolys (FEEDING SUPPLEMENT, PRO-STAT SUGAR FREE 64,) LIQD Place 30 mLs into feeding tube daily.  [name] amLODipine (NORVASC) 10 MG tablet Give once a day per gastric tube  . atorvastatin (LIPITOR) 80 MG tablet Place 80 mg into feeding tube at bedtime.  . collagenase (SANTYL) ointment Apply 1 application topically daily. Apply to sacrum wound per tx order.  . Dimethicone 1.3 % LOTN Apply to the extremities and back daily and prn for itching /dry skin do not apply to buttocks wound.  . diphenoxylate-atropine (LOMOTIL) 2.5-0.025 MG tablet Place 2 tablets into feeding tube every 4 (four) hours as needed for diarrhea or loose stools.  [name] FLUoxetine (PROZAC) 20 MG/5ML solution Place 20 mg into feeding tube daily.   . hydrocortisone cream 1 % Apply 1 application topically 2 (two) times daily.  . insulin aspart (NOVOLOG FLEXPEN) 100 UNIT/ML FlexPen Give per sliding scale every 6 hours  . insulin glargine (LANTUS) 100 UNIT/ML injection Inject 50 Units into the skin daily.  . lansoprazole (PREVACID) 30 MG capsule Take 30 mg by mouth 2 (two) times daily.  [name] LORazepam (ATIVAN) 0.5  MG tablet Take 1 tablet (0.5 mg total) by mouth every 6 (six) hours as needed for anxiety.  . Melatonin 1 MG TABS 2 mg by Feeding Tube route at bedtime.  . metoprolol tartrate (LOPRESSOR) 50 MG tablet Place 50 mg into feeding tube 2 (two) times daily.  . QUEtiapine (SEROQUEL) 25 MG tablet Take 12.5 mg by mouth 2 (two) times daily.  . [DISCONTINUED] atorvastatin (LIPITOR) 80 MG tablet Take 1 tablet (80 mg total) by mouth daily. (Patient taking differently: Place 80 mg into feeding tube daily. )   No facility-administered encounter medications on file as of 01/04/2019.     Review of Systems  Is unobtainable secondary to patient's mental status--please see HPI  There is no immunization history on file for this patient. Pertinent  Health Maintenance Due  Topic Date Due  . INFLUENZA VACCINE  01/24/2019 (Originally 07/12/2018)  . FOOT EXAM  01/24/2019 (Originally 06/14/1956)  . HEMOGLOBIN A1C  01/24/2019 (Originally 02/10/2013)  . OPHTHALMOLOGY EXAM  01/24/2019 (Originally 06/14/1956)  . URINE MICROALBUMIN  01/24/2019 (Originally 06/14/1956)  . COLONOSCOPY  01/24/2019 (Originally 06/14/1994)  . PNA vac Low Risk Adult (1 of 2 - PCV13) 01/24/2019 (Originally 06/14/2009)   No flowsheet data found. Functional Status Survey:    Vitals:   01/04/19 1408  BP: (!) 142/83  Pulse: 78  Resp: 20  Temp: 97.8 F (36.6 C)  TempSrc: Oral  SpO2: 96%    Physical Exam In general this is a fairly well-nourished elderly male he does not appear to be in any distress but he is very fidgety restless apparently this is his baseline.  His skin is warm and dry I do not note any diaphoresis or flushing.  Eyes sclera and conjunctive are clear he appears to make eye contact.  Oropharynx he does have some thrush apparent mucous membranes appear fairly moist.  Chest poor respiratory effort since he does not really follow verbal commands but I could not really appreciate any labored breathing any sign of distress or  congestion although effort was quite poor.  Heart was regular rate and rhythm without murmur gallop or rub he does not appear to have significant lower extremity edema.  Abdomen PEG tube is in place appears fairly unremarkable his abdomen is not distended it is soft and has active bowel sounds.  At times when I press it he does have significant grimacing but this is not persistent grimace.  GU does have an indwelling Foley catheter draining amber-colored urine.  Rectal he continues to have watery brown-colored diarrhea malodorous does appear once he gets cleaned up he has another bout  Musculoskeletal does have upper extremity right-sided hemiparesis continues with the involuntary movements of his lower extremity on the right- appears able to move his left upper and lower extremities at relative baseline.   Neurologic he is aphasic is not really following commands and continues to be restless... Continues with baseline right-sided deficits.  Psych again he is significantly restless  Neurologic Labs reviewed: Recent Labs    12/24/18 0850 12/31/18 0640  NA 137 134*  K 4.1 4.0  CL 99 97*  CO2 30 29  GLUCOSE 298* 239*  BUN 37* 34*  CREATININE 1.10 1.09  CALCIUM 8.8* 8.5*   No results for input(s): AST, ALT, ALKPHOS, BILITOT, PROT, ALBUMIN in the last 8760 hours. Recent Labs    12/24/18 0850 12/31/18 0640  WBC 7.4 7.4  NEUTROABS 5.0  --   HGB 13.7 13.5  HCT 43.7 42.3  MCV 94.6 95.9  PLT 271 288   No results found for: TSH Lab Results  Component Value Date   HGBA1C 7.6 (H) 08/13/2012   Lab Results  Component Value Date   CHOL 141 08/14/2012   HDL 33 (L) 08/14/2012   LDLCALC 78 08/14/2012   TRIG 151 (H) 08/14/2012   CHOLHDL 4.3 08/14/2012    Significant Diagnostic Results in last 30 days:  No results found.  Assessment/Plan  #1 recurrent diarrhea now with vomiting of PEG feedings- 1 would be concerned about a GI process- challenging situation-he does not  appear to be in any acute distress but this has been gradually progressing- he did get an order for Lomotil l but not sure how often he has been receiving this.  His Glucophage has been discontinued previously and a GI pathogen stool culture is pending.  He is on Prevacid for a history of a gastric ulcer Tube feeding actually was recently changed to Jevity as well but the diarrhea it appears to be persisting.  Will send him to the ER for evaluation  2.  History of nontraumatic subcortical hemorrhage of the left cerebral hemisphere continues with severe neurologic deficits Provigil was discontinued because he was anxious and restless-he does have Ativan as needed for agitation also low-dose Seroquel.  Apparently any progress with therapy has been very challenging.  3.  Insulin-dependent diabetes hemoglobin A1c was greater than 9 in the hospital since he is on Lantus blood sugars are running better it was in the 200s this noon.  4.  Hypertension is been stable he is on Norvasc and metoprolol.  5.  History of wound of buttocks that is followed by wound care with Santyl and Covaderm apparently has improved some.  6.  Urinary retention continues with the Foley catheter he is failed numerous voiding trials  [id number]-of note greater than 35 minutes spent assessing patient-reassessing patient discussing his status with nursing staff reviewing his chart and labs and coordinating a plan of care

## 2019-01-04 NOTE — H&P (Signed)
History and Physical    [name]:[id number] DOB: 1945/12/31 DOA: 01/04/2019  PCP: [name], MD   Patient coming from: SNF   Chief Complaint: Diarrhea  HPI: [name] is a 73 y.o. male with medical history significant for hypertension, insulin-dependent diabetes mellitus, coronary artery disease, and hemorrhagic CVA in December 2019, now presenting to emergency department for evaluation of diarrhea.  Patient is aphasic since his stroke and is unable to contribute to the history.  He is reportedly been experiencing diarrhea for several days at the SNF with C. difficile negative.  He did not appear to be in any pain.  He has been little bit more restless and agitated than usual. He receives tube feeds, has chronic Foley, and has pressure wounds prior to arrival.   ED Course: Upon arrival to the ED, patient is found to be afebrile, saturating well on room air, blood pressure 90/57, and vitals otherwise normal.  EKG features sinus rhythm with PVCs and nonspecific IVCD.  Noncontrast head CT reveals interval evolution of the late subacute left thalamic hemorrhage as expected.  CT abdomen pelvis reveals wall thickening and edema about the rectosigmoid colon concerning for proctitis.  Chemistry panel is notable for a BUN of 42 and glucose of 207.  CBC features a leukocytosis to 12,600.  Urinalysis is suggestive of infection.  Lactic acid is normal.  Patient was given 500 cc normal saline, Rocephin, ciprofloxacin, and Flagyl in the ED.  Blood pressure remained stable with MAP 70 and patient will be observed for further evaluation and management.  Review of Systems:  Unable to complete ROS secondary to the patient's clinical condition.   Past Medical History:  Diagnosis Date  . A-fib ([place])    a. Post-op afib after CABG 08/2012.  [name] CAD (coronary artery disease)    a. NSTEMI s/p stent to distal RCA 03/2000. b. Inf MI s/p emergent thrombectomy/stenting mid RCA 10/2000. c. NSTEMI s/p CABGx4  (LIMA-LAD, SVG-OM2, seq SVG-acute marginal and PD) 07/12/18 - course complicated by confusion, post-op AF, L pleural effusion with thoracentesis. d. Normal LV function by echo 08/2012.  . Diabetes mellitus   . GERD (gastroesophageal reflux disease)   . Hiatal hernia   . HLD (hyperlipidemia)   . HTN (hypertension)   . Peripheral vascular disease ([place])    a. Carotid dopplers neg 08/13/12. b. Pre-cabg ABIs - R=0.87 suggesting mild dz, L=1.29 possibly falsely elevated due to calcified vessels.  . Pleural effusion    a. L pleural eff after CABG s/p thoracentesis 08/22/12.  . Prostate cancer [facility])    Status post radiation treatment.  . Prostatic hypertrophy    a. Hx of urinary retention, awaiting TURP.  . Tobacco abuse   . Valvular heart disease    a. Mild  MR by TEE 08/2012.    Past Surgical History:  Procedure Laterality Date  . APPENDECTOMY    . BACK SURGERY    . CORONARY ARTERY BYPASS GRAFT  08/16/2012   Procedure: CORONARY ARTERY BYPASS GRAFTING (CABG);  Surgeon: Melrose Nakayama, MD;  Location: [facility];  Service: Open Heart Surgery;  Laterality: N/A;  . LEFT HEART CATHETERIZATION WITH CORONARY ANGIOGRAM N/A 08/14/2012   Procedure: LEFT HEART CATHETERIZATION WITH CORONARY ANGIOGRAM;  Surgeon: Peter M Martinique, MD;  Location: [facility] CATH LAB;  Service: Cardiovascular;  Laterality: N/A;     reports that he quit smoking about 46 years ago. He has never used smokeless tobacco. He reports that he does not drink alcohol  or use drugs.  No Known Allergies  Unable to obtain family history d/t patient's clinical condition.    Prior to Admission medications   Medication Sig Start Date End Date Taking? Authorizing Provider  Amino Acids-Protein Hydrolys (FEEDING SUPPLEMENT, PRO-STAT SUGAR FREE 64,) LIQD Place 30 mLs into feeding tube daily.   Yes [provider]  amLODipine (NORVASC) 10 MG tablet Place 10 mg into feeding tube daily. Give once a day per gastric tube    Yes [provider]    atorvastatin (LIPITOR) 80 MG tablet Place 80 mg into feeding tube at bedtime.   Yes [provider]  collagenase (SANTYL) ointment Apply 1 application topically See admin instructions. Apply to sacrum wound per tx order.    Yes [provider]  Dimethicone 1.3 % LOTN Apply 1 application topically See admin instructions. Apply to the extremities and back daily and as needed for itching /dry skin. Do not apply to buttocks wound.   Yes [provider]  diphenoxylate-atropine (LOMOTIL) 2.5-0.025 MG tablet Place 2 tablets into feeding tube every 4 (four) hours as needed for diarrhea or loose stools.   Yes [provider]  FLUoxetine (PROZAC) 20 MG/5ML solution Place 20 mg into feeding tube daily.    Yes [provider]  insulin aspart (NOVOLOG FLEXPEN) 100 UNIT/ML FlexPen Inject 2-6 Units into the skin See admin instructions. Give per sliding scale every 6 hours; If blood sugars are less than 60, CALL MD 200-250= 2 units 251-300= 4 units 301-351= 6 units If blood sugars are greater than 351, Give 0 units, CALL MD   Yes [provider]  insulin glargine (LANTUS) 100 UNIT/ML injection Inject 50 Units into the skin daily.   Yes [provider]  lansoprazole (PREVACID SOLUTAB) 30 MG disintegrating tablet Take 30 mg by mouth 2 (two) times daily. *Administered via gastric tube   Yes [provider]  Lidocaine-Glycerin (PREPARATION H) 5-14.4 % CREA Place 1 application rectally 2 (two) times daily. Preparation H: May also be applied as needed   Yes [provider]  LORazepam (ATIVAN) 0.5 MG tablet Take 1 tablet (0.5 mg total) by mouth every 6 (six) hours as needed for anxiety. Patient taking differently: Place 0.5 mg into feeding tube every 6 (six) hours as needed for anxiety.  12/26/18  Yes Lassen, Arlo C, PA-C  Melatonin 1 MG TABS 2 mg by Feeding Tube route at bedtime.   Yes [provider]  metoprolol tartrate  (LOPRESSOR) 50 MG tablet Place 50 mg into feeding tube 2 (two) times daily.   Yes [provider]  QUEtiapine (SEROQUEL) 25 MG tablet Take 12.5 mg by mouth 2 (two) times daily.   Yes [provider]    Physical Exam: Vitals:   01/04/19 1600 01/04/19 1615 01/04/19 1630 01/04/19 1645  BP:   (!) 90/57 (!) 95/57  Pulse:    77  Resp: (!) 21 13 20  (!) 24  SpO2:    97%  Weight:      Height:        Constitutional: NAD, calm  Eyes: PERTLA, lids and conjunctivae normal ENMT: Mucous membranes are moist. Posterior pharynx clear of any exudate or lesions.   Neck: normal, supple, no masses, no thyromegaly Respiratory: clear to auscultation bilaterally, no wheezing, no crackles. Normal respiratory effort.   Cardiovascular: S1 & S2 heard, regular rate and rhythm. No extremity edema.   Abdomen: No distension, soft. Bowel sounds active.  Musculoskeletal: no clubbing / cyanosis. No  joint deformity upper and lower extremities.    Skin: no significant rashes, lesions, ulcers. Warm, dry, well-perfused. Neurologic: No gross facial asymmetry. Not speaking. Patellar DTRs intact. Moving all extremities, Rt > Lt.     Labs on Admission: I have personally reviewed following labs and imaging studies  CBC: Recent Labs  Lab 12/31/18 0640 01/04/19 1445  WBC 7.4 12.6*  HGB 13.5 14.4  HCT 42.3 45.5  MCV 95.9 94.0  PLT 288 449   Basic Metabolic Panel: Recent Labs  Lab 12/31/18 0640 01/04/19 1445  NA 134* 139  K 4.0 4.1  CL 97* 104  CO2 29 28  GLUCOSE 239* 207*  BUN 34* 42*  CREATININE 1.09 1.15  CALCIUM 8.5* 8.6*   GFR: Estimated Creatinine Clearance: 60.9 mL/min (by C-G formula based on SCr of 1.15 mg/dL). Liver Function Tests: Recent Labs  Lab 01/04/19 1445  AST 33  ALT 47*  ALKPHOS 132*  BILITOT 0.8  PROT 6.6  ALBUMIN 2.7*   Recent Labs  Lab 01/04/19 1445  LIPASE 43   No results for input(s): AMMONIA in the last 168 hours. Coagulation Profile: No results  for input(s): INR, PROTIME in the last 168 hours. Cardiac Enzymes: No results for input(s): CKTOTAL, CKMB, CKMBINDEX, TROPONINI in the last 168 hours. BNP (last 3 results) No results for input(s): PROBNP in the last 8760 hours. HbA1C: No results for input(s): HGBA1C in the last 72 hours. CBG: No results for input(s): GLUCAP in the last 168 hours. Lipid Profile: No results for input(s): CHOL, HDL, LDLCALC, TRIG, CHOLHDL, LDLDIRECT in the last 72 hours. Thyroid Function Tests: No results for input(s): TSH, T4TOTAL, FREET4, T3FREE, THYROIDAB in the last 72 hours. Anemia Panel: No results for input(s): VITAMINB12, FOLATE, FERRITIN, TIBC, IRON, RETICCTPCT in the last 72 hours. Urine analysis:    Component Value Date/Time   COLORURINE AMBER (A) 01/04/2019 1450   APPEARANCEUR CLOUDY (A) 01/04/2019 1450   LABSPEC 1.019 01/04/2019 1450   PHURINE 5.0 01/04/2019 1450   GLUCOSEU 150 (A) 01/04/2019 1450   HGBUR SMALL (A) 01/04/2019 1450   BILIRUBINUR NEGATIVE 01/04/2019 1450   KETONESUR NEGATIVE 01/04/2019 1450   PROTEINUR >=300 (A) 01/04/2019 1450   UROBILINOGEN 1.0 08/23/2012 1732   NITRITE POSITIVE (A) 01/04/2019 1450   LEUKOCYTESUR MODERATE (A) 01/04/2019 1450   Sepsis Labs: @LABRCNTIP (procalcitonin:4,lacticidven:4) ) Recent Results (from the past 240 hour(s))  C difficile quick scan w PCR reflex     Status: None   Collection Time: 12/27/18  7:25 PM  Result Value Ref Range Status   C Diff antigen NEGATIVE NEGATIVE Final   C Diff toxin NEGATIVE NEGATIVE Final   C Diff interpretation No C. difficile detected.  Final    Comment: Performed at [facility], [address], [place], [place] [address]     Radiological Exams on Admission: Ct Head Wo Contrast  Result Date: 01/04/2019 CLINICAL DATA:  Initial evaluation for acute altered mental status. EXAM: CT HEAD WITHOUT CONTRAST TECHNIQUE: Contiguous axial images were obtained from the base of the skull through the vertex without  intravenous contrast. COMPARISON:  Prior CT from 11/17/2018. FINDINGS: Brain: Age-related cerebral atrophy with mild chronic small vessel ischemic disease, stable. There has been interval evolution of previously identified left thalamic hemorrhage, now late subacute in appearance with decreased size and attenuation as from previous. Mild residual subacute blood products measure approximately 2.0 x 1.6 cm. Residual surrounding vasogenic edema within this region with mild mass effect on the adjacent left lateral ventricle. Trace  residual left-to-right midline shift. Previously seen intraventricular hemorrhage has largely resolved. Overall ventricular size mildly increased without frank hydrocephalus. Basilar cisterns remain patent. No other acute intracranial hemorrhage. No acute large vessel territory infarct. No other mass lesion. No extra-axial fluid collection. Vascular: No hyperdense vessel. Scattered vascular calcifications noted within the carotid siphons. Skull: Scalp soft tissues and calvarium within normal limits. Sinuses/Orbits: Globes orbital soft tissues demonstrate no acute finding. Mild scattered mucosal thickening within the ethmoidal air cells. Visualized paranasal sinuses are otherwise clear. No mastoid effusion. Other: None. IMPRESSION: 1. Normal expected interval evolution of late subacute left thalamic hemorrhage. Associated intraventricular hemorrhage has resolved. Overall ventricular size mildly increased from previous without frank hydrocephalus. 2. No other acute intracranial abnormality. 3. Underlying age-related cerebral atrophy with chronic small vessel ischemic disease. Electronically Signed   By: Jeannine Boga M.D.   On: 01/04/2019 18:16   Ct Abdomen Pelvis W Contrast  Result Date: 01/04/2019 CLINICAL DATA:  Initial evaluation for acute nausea, vomiting, diarrhea. EXAM: CT ABDOMEN AND PELVIS WITH CONTRAST TECHNIQUE: Multidetector CT imaging of the abdomen and pelvis was  performed using the standard protocol following bolus administration of intravenous contrast. CONTRAST:  164mL ISOVUE-300 IOPAMIDOL (ISOVUE-300) INJECTION 61% COMPARISON:  Prior CT from 11/14/2014. FINDINGS: Lower chest: Scattered subsegmental atelectatic changes present within the visualized lung bases. Trace left pleural effusion. Visualized lungs are otherwise grossly clear. Hepatobiliary: Irregular somewhat linear hypodensity within the central aspect of the right hepatic lobe noted, indeterminate, but stable from previous, and of doubtful significance given stability. Liver otherwise within normal limits. Gallbladder surgically absent. No biliary dilatation. Pancreas: Pancreas grossly within normal limits. Spleen: Unremarkable. Adrenals/Urinary Tract: Adrenal glands are normal. Kidneys equal in size with symmetric enhancement. No nephrolithiasis, hydronephrosis, or focal enhancing renal mass. No appreciable hydroureter. Bladder decompressed with a Foley catheter in place. Gas lucency within the bladder lumen compatible with catheterization. Stomach/Bowel: Percutaneous gastrostomy tube in place within the stomach. No adverse features. Focal diverticulum arising from the second portion of the duodenum, grossly similar to previous. No obvious associated inflammation on this motion degraded exam. No evidence for bowel obstruction. Colonic diverticulosis without evidence for acute diverticulitis. Circumferential wall thickening with edema about the rectosigmoid colon, suggesting acute proctitis. Hazy inflammatory stranding within the adjacent perirectal fat. No evidence for perforation or other complication. Vascular/Lymphatic: Normal intravascular enhancement seen throughout the intra-abdominal aorta. Moderate atherosclerotic change. No aneurysm. Mesenteric vessels patent proximally. No adenopathy. Reproductive: Prostate within normal limits. Other: Small fat containing left inguinal hernia noted without associated  inflammation. No free air or fluid. Musculoskeletal: No acute osseous abnormality. No discrete lytic or blastic osseous lesions. IMPRESSION: 1. Circumferential wall thickening and edema about the rectosigmoid colon, suggesting acute proctitis. No complication. 2. Colonic diverticulosis without evidence for acute diverticulitis. 3. Percutaneous gastrostomy tube in place without adverse features. 4. Trace left pleural effusion. Aortic Atherosclerosis (ICD10-I70.0). Electronically Signed   By: Jeannine Boga M.D.   On: 01/04/2019 18:27    EKG: Independently reviewed. Sinus rhythm, non-specific IVCD.   Assessment/Plan   1. UTI; acute proctitis  - Presents with several days of diarrhea with C diff negative, found to have leukocytosis with normal lactate, UA compatible with infection, and CT abd/pelvis findings concerning for proctitis without abscess or perforation  - He was given IVF and empiric antibiotics in ED  - Continue Rocephin and Flagyl, send urine for culture, continue supportive care    2. History of hemorrhagic CVA  - Evolving as expected on CT with no  acute findings  - Continue tube-feeds, statin    3. Insulin-dependent DM  - A1c was 9.5% in December  - Managed at the SNF with Lantus 50 units qD and Novolog 0-6 units TID  - Check CBG's, continue Lantus and Novolog   4. Hypertension  - Norvasc and Lopressor held on admission d/t some low BP readings in ED   5. CAD  - Continue statin, resume beta-blocker as BP improves    DVT prophylaxis: Lovenox  Code Status: Full  Family Communication: Discussed with patient  Consults called: None Admission status: Observation     Vianne Bulls, MD [facility] Pager [phone]  If 7PM-7AM, please contact night-coverage [email or web address] Password [facility]  01/04/2019, 7:31 PM

## 2019-01-05 ENCOUNTER — Encounter (HOSPITAL_COMMUNITY): Payer: Self-pay

## 2019-01-05 DIAGNOSIS — N390 Urinary tract infection, site not specified: Secondary | ICD-10-CM | POA: Diagnosis not present

## 2019-01-05 DIAGNOSIS — L8990 Pressure ulcer of unspecified site, unspecified stage: Secondary | ICD-10-CM

## 2019-01-05 LAB — BASIC METABOLIC PANEL
Anion gap: 7 (ref 5–15)
BUN: 44 mg/dL — ABNORMAL HIGH (ref 8–23)
CO2: 26 mmol/L (ref 22–32)
Calcium: 8.1 mg/dL — ABNORMAL LOW (ref 8.9–10.3)
Chloride: 108 mmol/L (ref 98–111)
Creatinine, Ser: 1.22 mg/dL (ref 0.61–1.24)
GFR, EST NON AFRICAN AMERICAN: 58 mL/min — AB (ref >60)
Glucose, Bld: 116 mg/dL — ABNORMAL HIGH (ref 70–99)
Potassium: 3.5 mmol/L (ref 3.5–5.1)
Sodium: 141 mmol/L (ref 135–145)

## 2019-01-05 LAB — CBC WITH DIFFERENTIAL/PLATELET
Abs Immature Granulocytes: 0.02 10*3/uL (ref 0.00–0.07)
BASOS PCT: 0 %
Basophils Absolute: 0 10*3/uL (ref 0.0–0.1)
Eosinophils Absolute: 0.1 10*3/uL (ref 0.0–0.5)
Eosinophils Relative: 2 %
HCT: 44.3 % (ref 39.0–52.0)
Hemoglobin: 13.8 g/dL (ref 13.0–17.0)
Immature Granulocytes: 0 %
Lymphocytes Relative: 9 %
Lymphs Abs: 0.6 10*3/uL — ABNORMAL LOW (ref 0.7–4.0)
MCH: 29.8 pg (ref 26.0–34.0)
MCHC: 31.2 g/dL (ref 30.0–36.0)
MCV: 95.7 fL (ref 80.0–100.0)
Monocytes Absolute: 0.8 10*3/uL (ref 0.1–1.0)
Monocytes Relative: 12 %
Neutro Abs: 5.1 10*3/uL (ref 1.7–7.7)
Neutrophils Relative %: 77 %
PLATELETS: 291 10*3/uL (ref 150–400)
RBC: 4.63 MIL/uL (ref 4.22–5.81)
RDW: 13.2 % (ref 11.5–15.5)
WBC: 6.6 10*3/uL (ref 4.0–10.5)
nRBC: 0 % (ref 0.0–0.2)

## 2019-01-05 LAB — C DIFFICILE QUICK SCREEN W PCR REFLEX
C Diff antigen: NEGATIVE
C Diff interpretation: NOT DETECTED
C Diff toxin: NEGATIVE

## 2019-01-05 LAB — GLUCOSE, CAPILLARY
Glucose-Capillary: 111 mg/dL — ABNORMAL HIGH (ref 70–99)
Glucose-Capillary: 113 mg/dL — ABNORMAL HIGH (ref 70–99)
Glucose-Capillary: 103 mg/dL — ABNORMAL HIGH (ref 70–99)

## 2019-01-05 LAB — CBG MONITORING, ED
Glucose-Capillary: 80 mg/dL (ref 70–99)
Glucose-Capillary: 115 mg/dL — ABNORMAL HIGH (ref 70–99)

## 2019-01-05 LAB — MRSA PCR SCREENING: MRSA by PCR: POSITIVE — AB

## 2019-01-05 MED ORDER — FLUOXETINE HCL 20 MG PO CAPS
20.0000 mg | ORAL_CAPSULE | Freq: Every day | ORAL | Status: DC
  Administered 2019-01-05 – 2019-01-09 (×5): 20 mg
  Filled 2019-01-05 (×5): qty 1

## 2019-01-05 MED ORDER — FREE WATER
200.0000 mL | Freq: Four times a day (QID) | Status: DC
  Administered 2019-01-05 – 2019-01-09 (×17): 200 mL

## 2019-01-05 MED ORDER — SODIUM CHLORIDE 0.9 % IV SOLN
INTRAVENOUS | Status: DC | PRN
  Administered 2019-01-05: 500 mL via INTRAVENOUS

## 2019-01-05 MED ORDER — NYSTATIN 100000 UNIT/GM EX POWD
Freq: Two times a day (BID) | CUTANEOUS | Status: DC
  Administered 2019-01-05 (×2): via TOPICAL
  Administered 2019-01-06: 1 g via TOPICAL
  Administered 2019-01-06 – 2019-01-09 (×6): via TOPICAL
  Filled 2019-01-05 (×2): qty 15

## 2019-01-05 MED ORDER — SODIUM CHLORIDE 0.9 % IV SOLN
INTRAVENOUS | Status: DC
  Administered 2019-01-05 – 2019-01-07 (×3): via INTRAVENOUS

## 2019-01-05 MED ORDER — GLUCERNA 1.2 CAL PO LIQD
1000.0000 mL | ORAL | Status: DC
  Filled 2019-01-05 (×2): qty 1000

## 2019-01-05 MED ORDER — GLUCERNA 1.2 CAL PO LIQD
1000.0000 mL | ORAL | Status: DC
  Administered 2019-01-05: 1000 mL
  Filled 2019-01-05 (×4): qty 1000

## 2019-01-05 MED ORDER — MUPIROCIN 2 % EX OINT
1.0000 "application " | TOPICAL_OINTMENT | Freq: Two times a day (BID) | CUTANEOUS | Status: DC
  Administered 2019-01-05 – 2019-01-09 (×8): 1 via NASAL
  Filled 2019-01-05: qty 22

## 2019-01-05 MED ORDER — METOPROLOL TARTRATE 25 MG PO TABS
12.5000 mg | ORAL_TABLET | Freq: Two times a day (BID) | ORAL | Status: DC
  Administered 2019-01-05 – 2019-01-09 (×8): 12.5 mg via ORAL
  Filled 2019-01-05 (×8): qty 1

## 2019-01-05 MED ORDER — LOPERAMIDE HCL 1 MG/7.5ML PO SUSP: 2.0000 mg | ORAL | Status: DC | PRN

## 2019-01-05 MED ORDER — CHLORHEXIDINE GLUCONATE CLOTH 2 % EX PADS
6.0000 | MEDICATED_PAD | Freq: Every day | CUTANEOUS | Status: DC
  Administered 2019-01-06 – 2019-01-09 (×4): 6 via TOPICAL

## 2019-01-05 MED ORDER — LOPERAMIDE HCL 1 MG/7.5ML PO SUSP
2.0000 mg | Freq: Four times a day (QID) | ORAL | Status: DC | PRN
  Administered 2019-01-05 – 2019-01-09 (×7): 2 mg
  Filled 2019-01-05 (×8): qty 15

## 2019-01-05 NOTE — Progress Notes (Signed)
Admitted this morning from [facility]. Alert but unable to talk due to CVA in December.  Has had multiple watery stools today but neg for cdiff.  Started on Glucerna by peg tube and received immodium later and then  vomited soon after. Received zofran.  Mitts placed on hands due to pulling on peg etc.  Dr.  Denton Brick aware of vomiting and diarrhea and reduced tube feeding from 70 to 35 mls.  HOB greater than 35.

## 2019-01-05 NOTE — Progress Notes (Signed)
Patient Demographics:    [name], is a 73 y.o. male, DOB - 1946/09/19, EYC:[id number]  Admit date - 01/04/2019   Admitting Physician Vianne Bulls, MD  Outpatient Primary MD for the patient is [name], MD  LOS - 0   Chief Complaint  Patient presents with  . Emesis        Subjective:    [name] today has no fevers, no emesis,  No chest pain, patient is nonverbal, had episodes of emesis and diarrhea  Assessment  & Plan :    Principal Problem:   Acute UTI Active Problems:   IDDM (insulin dependent diabetes mellitus) ([place])   HTN (hypertension)   Proctitis   History of hemorrhagic cerebrovascular accident (CVA) with residual deficit   Pressure injury of skin  Brief summary 73 y.o. male with medical history significant for hypertension, insulin-dependent diabetes mellitus, coronary artery disease, and hemorrhagic CVA in December 2019 admitted from SNF on 01/04/2019 with diarrhea and emesis, C. difficile negative  Plan:- 1) vomiting or diarrhea--C. difficile negative, CT abdomen and pelvis suggest proctitis, treat empirically with Flagyl and Rocephin, antiemetics and PRN Imodium as ordered  2)FEN--decrease tube feeding to 35 mL an hour from 70 mL an hour due to concerns about vomiting, and water flushes  3) possible UTI--- leukocytosis, lactic acid was normal, continue IV Rocephin pending urine culture  4) history of prior hemorrhagic CVA--in December 2019, patient is aphasic, continue PEG tube feedings as above #2, continue statins  5)DM2-poor control, A1c 9.5 in 11/30/2018,--- continue insulin regimen  6)HTN--- blood pressure has been soft, continue to hold Norvasc, okay to use low-dose metoprolol avoid abrupt discontinuation of beta-blocker  7)h/o CAD--- metoprolol as above #6, continue statin as above #4,  Disposition/Need for in-Hospital Stay- patient unable to be discharged  at this time due to UTI as well as, proctitis with emesis and diarrhea with soft blood pressures requiring IV hydration, antiemetics and antidiarrheal agents pending cultures*  Code Status : Full  Family Communication:   na   Disposition Plan  : SNF when acute issues resolve  Consults  :  sw  DVT Prophylaxis  :  Lovenox -\  Lab Results  Component Value Date   PLT 291 01/05/2019    Inpatient Medications  Scheduled Meds: . atorvastatin  80 mg Per Tube QHS  . [START ON 01/06/2019] Chlorhexidine Gluconate Cloth  6 each Topical Q0600  . enoxaparin (LOVENOX) injection  40 mg Subcutaneous Q24H  . FLUoxetine  20 mg Per Tube Daily  . free water  200 mL Per Tube Q6H  . insulin aspart  0-5 Units Subcutaneous QHS  . insulin aspart  0-9 Units Subcutaneous TID WC  . insulin glargine  20 Units Subcutaneous Daily  . Melatonin  3 mg Feeding Tube QHS  . mupirocin ointment  1 application Nasal BID  . nystatin   Topical BID  . pantoprazole sodium  40 mg Per Tube BID  . QUEtiapine  12.5 mg Per Tube BID  . sodium chloride flush  3 mL Intravenous Once   Continuous Infusions: . sodium chloride 500 mL (01/05/19 1305)  . sodium chloride 125 mL/hr at 01/05/19 1600  . cefTRIAXone (ROCEPHIN)  IV 1 g (01/05/19 1603)  .  feeding supplement (GLUCERNA 1.2 CAL) 35 mL/hr at 01/05/19 1705  . metronidazole 500 mg (01/05/19 1754)   PRN Meds:.sodium chloride, acetaminophen **OR** acetaminophen, loperamide HCl, LORazepam, ondansetron **OR** ondansetron (ZOFRAN) IV    Anti-infectives (From admission, onward)   Start     Dose/Rate Route Frequency Ordered Stop   01/05/19 1600  cefTRIAXone (ROCEPHIN) 1 g in sodium chloride 0.9 % 100 mL IVPB     1 g 200 mL/hr over 30 Minutes Intravenous Every 24 hours 01/04/19 1931     01/05/19 0300  metroNIDAZOLE (FLAGYL) IVPB 500 mg     500 mg 100 mL/hr over 60 Minutes Intravenous Every 8 hours 01/04/19 1931     01/04/19 1900  ciprofloxacin (CIPRO) IVPB 400 mg  Status:   Discontinued     400 mg 200 mL/hr over 60 Minutes Intravenous  Once 01/04/19 1853 01/04/19 1931   01/04/19 1900  metroNIDAZOLE (FLAGYL) tablet 500 mg     500 mg Oral  Once 01/04/19 1853 01/04/19 2028   01/04/19 1530  cefTRIAXone (ROCEPHIN) 1 g in sodium chloride 0.9 % 100 mL IVPB     1 g 200 mL/hr over 30 Minutes Intravenous  Once 01/04/19 1529 01/04/19 1725        Objective:   Vitals:   01/05/19 1013 01/05/19 1121 01/05/19 1327 01/05/19 1607  BP: 122/71  (!) 87/66 135/76  Pulse: (!) 114 (!) 114 (!) 108 (!) 58  Resp: 18  20   Temp:  97.8 F (36.6 C)    TempSrc:  Oral    SpO2: 97%  91%   Weight: 72.7 kg     Height:        Wt Readings from Last 3 Encounters:  01/05/19 72.7 kg  06/05/14 89.8 kg  03/06/14 91.1 kg     Intake/Output Summary (Last 24 hours) at 01/05/2019 1818 Last data filed at 01/05/2019 1810 Gross per 24 hour  Intake 1242.56 ml  Output 650 ml  Net 592.56 ml     Physical Exam Patient is examined daily including today on 01/05/19 , exams remain the same as of yesterday except that has changed   Gen:- Awake Alert, nonverbal , aphasic due to recent stroke HEENT:- [place].AT, No sclera icterus Neck-Supple Neck,No JVD,.  Lungs-  CTAB , fair symmetrical air movement CV- S1, S2 normal, regular  Abd-  +ve B.Sounds, Abd Soft, No tenderness, PEG tube site is clean dry and intact Extremity/Skin:- No  edema, pedal pulses present  Psych-affect is appropriate, patient is aphasic Neuro-neuromuscular deficits consistent with recent CVA,   Data Review:   Micro Results Recent Results (from the past 240 hour(s))  C difficile quick scan w PCR reflex     Status: None   Collection Time: 12/27/18  7:25 PM  Result Value Ref Range Status   C Diff antigen NEGATIVE NEGATIVE Final   C Diff toxin NEGATIVE NEGATIVE Final   C Diff interpretation No C. difficile detected.  Final    Comment: Performed at [facility], [address] [place], [place] [address]  MRSA PCR Screening      Status: Abnormal   Collection Time: 01/05/19 12:30 PM  Result Value Ref Range Status   MRSA by PCR POSITIVE (A) NEGATIVE Final    Comment:        The GeneXpert MRSA Assay (FDA approved for NASAL specimens only), is one component of a comprehensive MRSA colonization surveillance program. It is not intended to diagnose MRSA infection nor to guide or monitor  treatment for MRSA infections. RESULT CALLED TO, READ BACK BY AND VERIFIED WITH: JACKSON,N@[email or web address]  BY MATTHEWS,B 1.25.2020 Performed at [facility], [address], [place], [place] [address]   C difficile quick scan w PCR reflex     Status: None   Collection Time: 01/05/19 12:50 PM  Result Value Ref Range Status   C Diff antigen NEGATIVE NEGATIVE Final   C Diff toxin NEGATIVE NEGATIVE Final   C Diff interpretation No C. difficile detected.  Final    Comment: Performed at [facility], [address], [place], [place] [address]    Radiology Reports Ct Head Wo Contrast  Result Date: 01/04/2019 CLINICAL DATA:  Initial evaluation for acute altered mental status. EXAM: CT HEAD WITHOUT CONTRAST TECHNIQUE: Contiguous axial images were obtained from the base of the skull through the vertex without intravenous contrast. COMPARISON:  Prior CT from 11/17/2018. FINDINGS: Brain: Age-related cerebral atrophy with mild chronic small vessel ischemic disease, stable. There has been interval evolution of previously identified left thalamic hemorrhage, now late subacute in appearance with decreased size and attenuation as from previous. Mild residual subacute blood products measure approximately 2.0 x 1.6 cm. Residual surrounding vasogenic edema within this region with mild mass effect on the adjacent left lateral ventricle. Trace residual left-to-right midline shift. Previously seen intraventricular hemorrhage has largely resolved. Overall ventricular size mildly increased without frank hydrocephalus. Basilar cisterns remain patent. No other acute  intracranial hemorrhage. No acute large vessel territory infarct. No other mass lesion. No extra-axial fluid collection. Vascular: No hyperdense vessel. Scattered vascular calcifications noted within the carotid siphons. Skull: Scalp soft tissues and calvarium within normal limits. Sinuses/Orbits: Globes orbital soft tissues demonstrate no acute finding. Mild scattered mucosal thickening within the ethmoidal air cells. Visualized paranasal sinuses are otherwise clear. No mastoid effusion. Other: None. IMPRESSION: 1. Normal expected interval evolution of late subacute left thalamic hemorrhage. Associated intraventricular hemorrhage has resolved. Overall ventricular size mildly increased from previous without frank hydrocephalus. 2. No other acute intracranial abnormality. 3. Underlying age-related cerebral atrophy with chronic small vessel ischemic disease. Electronically Signed   By: Jeannine Boga M.D.   On: 01/04/2019 18:16   Ct Abdomen Pelvis W Contrast  Result Date: 01/04/2019 CLINICAL DATA:  Initial evaluation for acute nausea, vomiting, diarrhea. EXAM: CT ABDOMEN AND PELVIS WITH CONTRAST TECHNIQUE: Multidetector CT imaging of the abdomen and pelvis was performed using the standard protocol following bolus administration of intravenous contrast. CONTRAST:  130mL ISOVUE-300 IOPAMIDOL (ISOVUE-300) INJECTION 61% COMPARISON:  Prior CT from 11/14/2014. FINDINGS: Lower chest: Scattered subsegmental atelectatic changes present within the visualized lung bases. Trace left pleural effusion. Visualized lungs are otherwise grossly clear. Hepatobiliary: Irregular somewhat linear hypodensity within the central aspect of the right hepatic lobe noted, indeterminate, but stable from previous, and of doubtful significance given stability. Liver otherwise within normal limits. Gallbladder surgically absent. No biliary dilatation. Pancreas: Pancreas grossly within normal limits. Spleen: Unremarkable. Adrenals/Urinary  Tract: Adrenal glands are normal. Kidneys equal in size with symmetric enhancement. No nephrolithiasis, hydronephrosis, or focal enhancing renal mass. No appreciable hydroureter. Bladder decompressed with a Foley catheter in place. Gas lucency within the bladder lumen compatible with catheterization. Stomach/Bowel: Percutaneous gastrostomy tube in place within the stomach. No adverse features. Focal diverticulum arising from the second portion of the duodenum, grossly similar to previous. No obvious associated inflammation on this motion degraded exam. No evidence for bowel obstruction. Colonic diverticulosis without evidence for acute diverticulitis. Circumferential wall thickening with edema about the rectosigmoid colon, suggesting acute proctitis. Hazy  inflammatory stranding within the adjacent perirectal fat. No evidence for perforation or other complication. Vascular/Lymphatic: Normal intravascular enhancement seen throughout the intra-abdominal aorta. Moderate atherosclerotic change. No aneurysm. Mesenteric vessels patent proximally. No adenopathy. Reproductive: Prostate within normal limits. Other: Small fat containing left inguinal hernia noted without associated inflammation. No free air or fluid. Musculoskeletal: No acute osseous abnormality. No discrete lytic or blastic osseous lesions. IMPRESSION: 1. Circumferential wall thickening and edema about the rectosigmoid colon, suggesting acute proctitis. No complication. 2. Colonic diverticulosis without evidence for acute diverticulitis. 3. Percutaneous gastrostomy tube in place without adverse features. 4. Trace left pleural effusion. Aortic Atherosclerosis (ICD10-I70.0). Electronically Signed   By: Jeannine Boga M.D.   On: 01/04/2019 18:27     CBC Recent Labs  Lab 12/31/18 0640 01/04/19 1445 01/05/19 0715  WBC 7.4 12.6* 6.6  HGB 13.5 14.4 13.8  HCT 42.3 45.5 44.3  PLT 288 334 291  MCV 95.9 94.0 95.7  MCH 30.6 29.8 29.8  MCHC 31.9 31.6  31.2  RDW 12.9 12.9 13.2  LYMPHSABS  --   --  0.6*  MONOABS  --   --  0.8  EOSABS  --   --  0.1  BASOSABS  --   --  0.0    Chemistries  Recent Labs  Lab 12/31/18 0640 01/04/19 1445 01/05/19 0715  NA 134* 139 141  K 4.0 4.1 3.5  CL 97* 104 108  CO2 29 28 26   GLUCOSE 239* 207* 116*  BUN 34* 42* 44*  CREATININE 1.09 1.15 1.22  CALCIUM 8.5* 8.6* 8.1*  AST  --  33  --   ALT  --  47*  --   ALKPHOS  --  132*  --   BILITOT  --  0.8  --    ------------------------------------------------------------------------------------------------------------------ No results for input(s): CHOL, HDL, LDLCALC, TRIG, CHOLHDL, LDLDIRECT in the last 72 hours.  Lab Results  Component Value Date   HGBA1C 7.6 (H) 08/13/2012   ------------------------------------------------------------------------------------------------------------------ No results for input(s): TSH, T4TOTAL, T3FREE, THYROIDAB in the last 72 hours.  Invalid input(s): FREET3 ------------------------------------------------------------------------------------------------------------------ No results for input(s): VITAMINB12, FOLATE, FERRITIN, TIBC, IRON, RETICCTPCT in the last 72 hours.  Coagulation profile No results for input(s): INR, PROTIME in the last 168 hours.  No results for input(s): DDIMER in the last 72 hours.  Cardiac Enzymes No results for input(s): CKMB, TROPONINI, MYOGLOBIN in the last 168 hours.  Invalid input(s): CK ------------------------------------------------------------------------------------------------------------------ No results found for: BNP   Roxan Hockey M.D on 01/05/2019 at 6:18 PM  Go to [email or web address] - for contact info  [facility] - Office  [phone]

## 2019-01-05 NOTE — Progress Notes (Signed)
Initial Nutrition Assessment  DOCUMENTATION CODES:  Not applicable  INTERVENTION:  Initiate Glucerna 1.2 @ 70 ml/hr via PEG w/ 200 cc free water flush q 6 hrs  Tube feeding regimen provides 2016 kcals, 101grams of protein, and 1352 ml of H2O (+800 cc from flushes)  NUTRITION DIAGNOSIS:  Inadequate oral intake related to inability to eat as evidenced by NPO status.  GOAL:  Patient will meet greater than or equal to 90% of their needs  MONITOR:  PO intake, Labs, Skin, TF tolerance, I & O's  REASON FOR ASSESSMENT:  Consult Enteral/tube feeding initiation and management  ASSESSMENT:  73 y/o male w/ PMHx DM2, HTN/HLD, Prostate Cancer. Hospitalized 12/8-1/10 for ICH/IVH w/ residual dysphagia, R hemiplegia and global encephalopathy. NPO on TF s/p peg. Hospital course c/b PNA/UTI. Was discharged to SNF for ongoing rehab. Now presents to ED with progressive worsening of loose stools over past several days and acute emesis. Work up revealed acute proctitis and UTI. Admitted for management.   RD operating remotely on weekends. However, pt is extremely familiar to this RD as he has managed pt since his arrival to [facility] center) several weeks ago.   Pt originally came from [place] on a TF regimen of: Diabetisource 1.2 @ 70cc/hr x 24 hrs. Given that Diabetisource is not on formulary, he was changed to Glucerna 1.2 on arrival to SNF 1/10. Shortly after arrival, pt developed significant diarrhea. Being as there had been no mention of pt having loose stools in his notes from [facility], RD recommended stool be checked for Cdiff; this came back negative. Being the TF change still felt to be unlikely cause of diarrhea, medications were reviewed and it was found the pts home metformin dose (500 mg BID) had been doubled on discharge from [place] (1000 mg BID). MD discontinued metformin 1/16 and stools quickly improved.   However, this past week, pt again started to have loose  stools. This turned into profuse diarrhea as week went on. Though RD strongly felt TF not the cause, decided to change formula to Jev 1.2 1/23 to rule this out. Yesterday, ~24 hrs after TF change, pt again had diarrhea, this time with concomitant tube-feed colored emesis. Upon RD notification by nurse, RD recommended consulting PA in facility, as RD felt it this was much more consistent with an acute process, than TF intolerance.   Given, pt's diarrhea now found to be attributable to acute illness, will revert change of TF back to Glucerna 1.2 @ 75 cc/hr as he has had persistent hyperglycemia.  Pt has had significant wt loss since presentation to [facility]. Admitted there @ 182.5 lbs (12/9). Left at 176.6 (1/9). Now 168.4 = loss of 14.1 lbs (7.7% x < months)   Potentially also of note, SNF pharmD discovered just yesterday that  fluoextine was also a newly prescribed medication on discharge that causes diarrhea in ~20% pts.   Labs: BUN/Creat: 44/1.22, Albumin: 2.7, WBC:12.6, BGs 80-120 Meds: Insulin, Prostat, PPI, Seroquel, IV abx  Recent Labs  Lab 12/31/18 0640 01/04/19 1445 01/05/19 0715  NA 134* 139 141  K 4.0 4.1 3.5  CL 97* 104 108  CO2 29 28 26   BUN 34* 42* 44*  CREATININE 1.09 1.15 1.22  CALCIUM 8.5* 8.6* 8.1*  GLUCOSE 239* 207* 116*    NUTRITION - FOCUSED PHYSICAL EXAM: Unable to conduct  Diet Order:   Diet Order    None     EDUCATION NEEDS:  No education needs have been  identified at this time  Skin:  Pressure Ulcer stage III to sacrum   Last BM: Yesterday   Height:  Ht Readings from Last 1 Encounters:  01/04/19 6' (1.829 m)   Weight:  Wt Readings from Last 1 Encounters:  01/04/19 76.4 kg   Wt Readings from Last 10 Encounters:  01/04/19 76.4 kg  06/05/14 89.8 kg  03/06/14 91.1 kg  10/28/13 93 kg  05/01/13 89.4 kg  01/03/13 87.3 kg  10/22/12 79.9 kg  09/28/12 81.2 kg  09/18/12 80.7 kg  09/03/12 84.7 kg   Ideal Body Weight:  80.91 kg  BMI:  Body  mass index is 22.84 kg/m.  Estimated Nutritional Needs:  Kcal:  2000-2200 kcals (26-29 kcal/kg bw) Protein:  100-115g Pro (1.3-1.5g/kg bw) Fluid:  2-2.2 L (1 ml/kcal) + enough to replace stool losses  Burtis Junes RD, LDN, CNSC Clinical Nutrition Available Tues-Sat via Pager: [phone] 01/05/2019 9:16 AM

## 2019-01-06 DIAGNOSIS — R471 Dysarthria and anarthria: Secondary | ICD-10-CM | POA: Diagnosis present

## 2019-01-06 DIAGNOSIS — E785 Hyperlipidemia, unspecified: Secondary | ICD-10-CM | POA: Diagnosis present

## 2019-01-06 DIAGNOSIS — N183 Chronic kidney disease, stage 3 (moderate): Secondary | ICD-10-CM | POA: Diagnosis present

## 2019-01-06 DIAGNOSIS — K219 Gastro-esophageal reflux disease without esophagitis: Secondary | ICD-10-CM | POA: Diagnosis present

## 2019-01-06 DIAGNOSIS — R197 Diarrhea, unspecified: Secondary | ICD-10-CM | POA: Diagnosis present

## 2019-01-06 DIAGNOSIS — N400 Enlarged prostate without lower urinary tract symptoms: Secondary | ICD-10-CM | POA: Diagnosis present

## 2019-01-06 DIAGNOSIS — N390 Urinary tract infection, site not specified: Secondary | ICD-10-CM | POA: Diagnosis present

## 2019-01-06 DIAGNOSIS — I493 Ventricular premature depolarization: Secondary | ICD-10-CM | POA: Diagnosis present

## 2019-01-06 DIAGNOSIS — K6289 Other specified diseases of anus and rectum: Secondary | ICD-10-CM | POA: Diagnosis present

## 2019-01-06 DIAGNOSIS — Z1612 Extended spectrum beta lactamase (ESBL) resistance: Secondary | ICD-10-CM | POA: Diagnosis present

## 2019-01-06 DIAGNOSIS — E1122 Type 2 diabetes mellitus with diabetic chronic kidney disease: Secondary | ICD-10-CM | POA: Diagnosis present

## 2019-01-06 DIAGNOSIS — B961 Klebsiella pneumoniae [K. pneumoniae] as the cause of diseases classified elsewhere: Secondary | ICD-10-CM | POA: Diagnosis present

## 2019-01-06 DIAGNOSIS — E860 Dehydration: Secondary | ICD-10-CM | POA: Diagnosis present

## 2019-01-06 DIAGNOSIS — R4701 Aphasia: Secondary | ICD-10-CM | POA: Diagnosis present

## 2019-01-06 DIAGNOSIS — T83518A Infection and inflammatory reaction due to other urinary catheter, initial encounter: Secondary | ICD-10-CM | POA: Diagnosis present

## 2019-01-06 DIAGNOSIS — T83511A Infection and inflammatory reaction due to indwelling urethral catheter, initial encounter: Secondary | ICD-10-CM | POA: Diagnosis not present

## 2019-01-06 DIAGNOSIS — Y846 Urinary catheterization as the cause of abnormal reaction of the patient, or of later complication, without mention of misadventure at the time of the procedure: Secondary | ICD-10-CM | POA: Diagnosis present

## 2019-01-06 DIAGNOSIS — I2510 Atherosclerotic heart disease of native coronary artery without angina pectoris: Secondary | ICD-10-CM | POA: Diagnosis present

## 2019-01-06 DIAGNOSIS — I129 Hypertensive chronic kidney disease with stage 1 through stage 4 chronic kidney disease, or unspecified chronic kidney disease: Secondary | ICD-10-CM | POA: Diagnosis present

## 2019-01-06 DIAGNOSIS — E43 Unspecified severe protein-calorie malnutrition: Secondary | ICD-10-CM | POA: Diagnosis present

## 2019-01-06 DIAGNOSIS — R4702 Dysphasia: Secondary | ICD-10-CM | POA: Diagnosis present

## 2019-01-06 DIAGNOSIS — N179 Acute kidney failure, unspecified: Secondary | ICD-10-CM | POA: Diagnosis present

## 2019-01-06 DIAGNOSIS — E1165 Type 2 diabetes mellitus with hyperglycemia: Secondary | ICD-10-CM | POA: Diagnosis present

## 2019-01-06 DIAGNOSIS — Z6821 Body mass index (BMI) 21.0-21.9, adult: Secondary | ICD-10-CM | POA: Diagnosis not present

## 2019-01-06 DIAGNOSIS — L89153 Pressure ulcer of sacral region, stage 3: Secondary | ICD-10-CM | POA: Diagnosis present

## 2019-01-06 DIAGNOSIS — I4891 Unspecified atrial fibrillation: Secondary | ICD-10-CM | POA: Diagnosis present

## 2019-01-06 DIAGNOSIS — E870 Hyperosmolality and hypernatremia: Secondary | ICD-10-CM | POA: Diagnosis present

## 2019-01-06 DIAGNOSIS — I10 Essential (primary) hypertension: Secondary | ICD-10-CM | POA: Diagnosis not present

## 2019-01-06 LAB — GASTROINTESTINAL PANEL BY PCR, STOOL (REPLACES STOOL CULTURE)

## 2019-01-06 LAB — CBC
HCT: 43.8 % (ref 39.0–52.0)
Hemoglobin: 13.6 g/dL (ref 13.0–17.0)
MCH: 30.6 pg (ref 26.0–34.0)
MCHC: 31.1 g/dL (ref 30.0–36.0)
MCV: 98.4 fL (ref 80.0–100.0)
Platelets: 309 10*3/uL (ref 150–400)
RBC: 4.45 MIL/uL (ref 4.22–5.81)
RDW: 13.2 % (ref 11.5–15.5)
WBC: 6.8 10*3/uL (ref 4.0–10.5)
nRBC: 0 % (ref 0.0–0.2)

## 2019-01-06 LAB — BASIC METABOLIC PANEL
Anion gap: 7 (ref 5–15)
BUN: 40 mg/dL — ABNORMAL HIGH (ref 8–23)
CHLORIDE: 112 mmol/L — AB (ref 98–111)
CO2: 28 mmol/L (ref 22–32)
Calcium: 8.1 mg/dL — ABNORMAL LOW (ref 8.9–10.3)
Creatinine, Ser: 1.39 mg/dL — ABNORMAL HIGH (ref 0.61–1.24)
GFR calc Af Amer: 57 mL/min — ABNORMAL LOW (ref >60)
GFR calc non Af Amer: 50 mL/min — ABNORMAL LOW (ref >60)
Glucose, Bld: 99 mg/dL (ref 70–99)
Potassium: 3.5 mmol/L (ref 3.5–5.1)
Sodium: 147 mmol/L — ABNORMAL HIGH (ref 135–145)

## 2019-01-06 LAB — GLUCOSE, CAPILLARY
Glucose-Capillary: 58 mg/dL — ABNORMAL LOW (ref 70–99)
Glucose-Capillary: 60 mg/dL — ABNORMAL LOW (ref 70–99)
Glucose-Capillary: 83 mg/dL (ref 70–99)
Glucose-Capillary: 89 mg/dL (ref 70–99)

## 2019-01-06 MED ORDER — DIPHENOXYLATE-ATROPINE 2.5-0.025 MG/5ML PO LIQD: 10.0000 mL | Freq: Once | ORAL | Status: DC

## 2019-01-06 MED ORDER — GLUCOSE 40 % PO GEL
ORAL | Status: AC
  Administered 2019-01-06: 16:00:00
  Filled 2019-01-06: qty 1

## 2019-01-06 MED ORDER — DIPHENOXYLATE-ATROPINE 2.5-0.025 MG PO TABS
2.0000 | ORAL_TABLET | Freq: Once | ORAL | Status: AC
  Administered 2019-01-06: 2 via ORAL
  Filled 2019-01-06: qty 2

## 2019-01-06 MED ORDER — POTASSIUM CHLORIDE 20 MEQ PO PACK
40.0000 meq | PACK | Freq: Once | ORAL | Status: AC
  Administered 2019-01-06: 40 meq via ORAL
  Filled 2019-01-06: qty 2

## 2019-01-06 MED ORDER — GLUCERNA 1.2 CAL PO LIQD
1000.0000 mL | ORAL | Status: DC
  Administered 2019-01-06 – 2019-01-07 (×2): 1000 mL
  Filled 2019-01-06 (×3): qty 1000

## 2019-01-06 NOTE — Progress Notes (Signed)
Patient Demographics:    [name], is a 73 y.o. male, DOB - 03/26/1946, MEQ:[id number]  Admit date - 01/04/2019   Admitting Physician Tanner Bulls, MD  Outpatient Primary MD for the patient is Tanner Percy, MD  LOS - 0   Chief Complaint  Patient presents with  . Emesis        Subjective:    [name] today has no fevers, no emesis,  No chest pain, patient is nonverbal, had episodes of emesis and diarrhea  Assessment  & Plan :    Principal Problem:   Acute UTI Active Problems:   IDDM (insulin dependent diabetes mellitus) ([place])   HTN (hypertension)   Proctitis   History of hemorrhagic cerebrovascular accident (CVA) with residual deficit   Pressure injury of skin  Brief summary 73 y.o. male with medical history significant for hypertension, insulin-dependent diabetes mellitus, coronary artery disease, and hemorrhagic CVA in December 2019 admitted from SNF on 01/04/2019 with diarrhea and emesis, C. difficile and GI pathogen are negative, urine culture with Klebsiella  Plan:- 1) vomiting or diarrhea--C. difficile negative, GI panel negative, CT abdomen and pelvis suggest proctitis, continue IV Flagyl and Rocephin, antiemetics and PRN Imodium as ordered  2)FEN--- hypernatremia with sodium up to 147 and AKI with creatinine close to 1.4 noted, most likely due to GI losses as a result of vomiting and diarrhea compounded by decreased tube feed due to vomiting, no further emesis so okay to increase Glucerna to feed back to 50 mL an hour , goal is  70 mL an hour , consider increasing water flushes if hypernatremia persists  3) Klebsiella UTI--- leukocytosis, lactic acid was normal, continue IV Rocephin pending urine culture sensitivity  4) history of prior hemorrhagic CVA--in December 2019, patient is aphasic, continue PEG tube feedings as above #2, continue statins  5)DM2-poor control, A1c 9.5 in  11/30/2018,--- continue insulin regimen including Lantus insulin 20 units  6)HTN---  stable at this time continue to hold Norvasc, okay to use low-dose metoprolol avoid abrupt discontinuation of beta-blocker  7)h/o CAD--- metoprolol as above #6, continue statin as above #4,  8)Sacral Decubitus--stage III sacral wound measuring 5 x 4.5 CM , it is about  0.5 CM deep, please see photo in EMR, wound care consult requested  Disposition/Need for in-Hospital Stay- patient unable to be discharged at this time due to Klebsiella UTI requiring IV antibiotic pending culture sensitivity report, as well as Proctitis with emesis and diarrhea with AKI and hypernatremia due to free water deficit and dehydrationi requiring IV hydration, antiemetics and antidiarrheal agents pending cultures*  Code Status : Full  Family Communication:   na   Disposition Plan  : SNF when acute issues resolve  Consults  :  sw  DVT Prophylaxis  :  Lovenox -\  Lab Results  Component Value Date   PLT 309 01/06/2019    Inpatient Medications  Scheduled Meds: . atorvastatin  80 mg Per Tube QHS  . Chlorhexidine Gluconate Cloth  6 each Topical Q0600  . diphenoxylate-atropine  10 mL Oral Once  . enoxaparin (LOVENOX) injection  40 mg Subcutaneous Q24H  . FLUoxetine  20 mg Per Tube Daily  . free water  200 mL Per Tube Q6H  . insulin  aspart  0-5 Units Subcutaneous QHS  . insulin aspart  0-9 Units Subcutaneous TID WC  . insulin glargine  20 Units Subcutaneous Daily  . Melatonin  3 mg Feeding Tube QHS  . metoprolol tartrate  12.5 mg Oral BID  . mupirocin ointment  1 application Nasal BID  . nystatin   Topical BID  . pantoprazole sodium  40 mg Per Tube BID  . potassium chloride  40 mEq Oral Once  . QUEtiapine  12.5 mg Per Tube BID  . sodium chloride flush  3 mL Intravenous Once   Continuous Infusions: . sodium chloride 500 mL (01/05/19 1305)  . sodium chloride 50 mL/hr at 01/06/19 1448  . cefTRIAXone (ROCEPHIN)  IV 1 g  (01/06/19 1450)  . feeding supplement (GLUCERNA 1.2 CAL) 1,000 mL (01/06/19 1505)  . metronidazole 500 mg (01/06/19 0954)   PRN Meds:.sodium chloride, acetaminophen **OR** acetaminophen, loperamide HCl, LORazepam, ondansetron **OR** ondansetron (ZOFRAN) IV    Anti-infectives (From admission, onward)   Start     Dose/Rate Route Frequency Ordered Stop   01/05/19 1600  cefTRIAXone (ROCEPHIN) 1 g in sodium chloride 0.9 % 100 mL IVPB     1 g 200 mL/hr over 30 Minutes Intravenous Every 24 hours 01/04/19 1931     01/05/19 0300  metroNIDAZOLE (FLAGYL) IVPB 500 mg     500 mg 100 mL/hr over 60 Minutes Intravenous Every 8 hours 01/04/19 1931     01/04/19 1900  ciprofloxacin (CIPRO) IVPB 400 mg  Status:  Discontinued     400 mg 200 mL/hr over 60 Minutes Intravenous  Once 01/04/19 1853 01/04/19 1931   01/04/19 1900  metroNIDAZOLE (FLAGYL) tablet 500 mg     500 mg Oral  Once 01/04/19 1853 01/04/19 2028   01/04/19 1530  cefTRIAXone (ROCEPHIN) 1 g in sodium chloride 0.9 % 100 mL IVPB     1 g 200 mL/hr over 30 Minutes Intravenous  Once 01/04/19 1529 01/04/19 1725        Objective:   Vitals:   01/05/19 1607 01/05/19 2101 01/05/19 2204 01/06/19 1425  BP: 135/76  (!) 155/67 116/69  Pulse: (!) 58  (!) 104 81  Resp:   19 16  Temp:   98.3 F (36.8 C) 98.6 F (37 C)  TempSrc:   Oral Oral  SpO2:  93% 96% 96%  Weight:      Height:        Wt Readings from Last 3 Encounters:  01/05/19 72.7 kg  06/05/14 89.8 kg  03/06/14 91.1 kg     Intake/Output Summary (Last 24 hours) at 01/06/2019 1528 Last data filed at 01/06/2019 1522 Gross per 24 hour  Intake 2683.25 ml  Output 200 ml  Net 2483.25 ml     Physical Exam Patient is examined daily including today on 01/06/19 , exams remain the same as of yesterday except that has changed   Gen:- Awake Alert, nonverbal , aphasic due to recent stroke HEENT:- [place].AT, No sclera icterus Neck-Supple Neck,No JVD,.  Lungs-  CTAB , fair symmetrical air  movement CV- S1, S2 normal, regular  Abd-  +ve B.Sounds, Abd Soft, No tenderness, PEG tube site is clean dry and intact Extremity:- No  edema, pedal pulses present  Psych-affect is appropriate, patient is aphasic Neuro-neuromuscular deficits consistent with recent CVA, Skin- stage III sacral wound measuring 5 x 4.5 CM , it is about  0.5 CM deep,   Data Review:   Micro Results Recent Results (from the past 240 hour(s))  C difficile quick scan w PCR reflex     Status: None   Collection Time: 12/27/18  7:25 PM  Result Value Ref Range Status   C Diff antigen NEGATIVE NEGATIVE Final   C Diff toxin NEGATIVE NEGATIVE Final   C Diff interpretation No C. difficile detected.  Final    Comment: Performed at [facility], [address], [place], [place] [address]  Urine culture     Status: Abnormal (Preliminary result)   Collection Time: 01/04/19  6:53 PM  Result Value Ref Range Status   Specimen Description URINE, CLEAN CATCH  Final   Special Requests   Final    NONE Performed at [facility], [address] [place], [place] [address]    Culture >=100,000 COLONIES/mL KLEBSIELLA OXYTOCA (A)  Final   Report Status PENDING  Incomplete  MRSA PCR Screening     Status: Abnormal   Collection Time: 01/05/19 12:30 PM  Result Value Ref Range Status   MRSA by PCR POSITIVE (A) NEGATIVE Final    Comment:        The GeneXpert MRSA Assay (FDA approved for NASAL specimens only), is one component of a comprehensive MRSA colonization surveillance program. It is not intended to diagnose MRSA infection nor to guide or monitor treatment for MRSA infections. RESULT CALLED TO, READ BACK BY AND VERIFIED WITH: JACKSON,[email or web address]  BY MATTHEWS,B 1.25.2020 Performed at [facility], [address], [place], [place] [address]   C difficile quick scan w PCR reflex     Status: None   Collection Time: 01/05/19 12:50 PM  Result Value Ref Range Status   C Diff antigen NEGATIVE NEGATIVE Final   C Diff toxin NEGATIVE  NEGATIVE Final   C Diff interpretation No C. difficile detected.  Final    Comment: Performed at [facility], [address] [place], [place] [address]  Gastrointestinal Panel by PCR , Stool     Status: None   Collection Time: 01/05/19 12:50 PM  Result Value Ref Range Status   Campylobacter species NOT DETECTED NOT DETECTED Final   Plesimonas shigelloides NOT DETECTED NOT DETECTED Final   Salmonella species NOT DETECTED NOT DETECTED Final   Yersinia enterocolitica NOT DETECTED NOT DETECTED Final   Vibrio species NOT DETECTED NOT DETECTED Final   Vibrio cholerae NOT DETECTED NOT DETECTED Final   Enteroaggregative E coli (EAEC) NOT DETECTED NOT DETECTED Final   Enteropathogenic E coli (EPEC) NOT DETECTED NOT DETECTED Final   Enterotoxigenic E coli (ETEC) NOT DETECTED NOT DETECTED Final   Shiga like toxin producing E coli (STEC) NOT DETECTED NOT DETECTED Final   Shigella/Enteroinvasive E coli (EIEC) NOT DETECTED NOT DETECTED Final   Cryptosporidium NOT DETECTED NOT DETECTED Final   Cyclospora cayetanensis NOT DETECTED NOT DETECTED Final   Entamoeba histolytica NOT DETECTED NOT DETECTED Final   Giardia lamblia NOT DETECTED NOT DETECTED Final   Adenovirus F40/41 NOT DETECTED NOT DETECTED Final   Astrovirus NOT DETECTED NOT DETECTED Final   Norovirus GI/GII NOT DETECTED NOT DETECTED Final   Rotavirus A NOT DETECTED NOT DETECTED Final   Sapovirus (I, II, IV, and V) NOT DETECTED NOT DETECTED Final    Comment: Performed at [facility], [address], [place],  [address]    Radiology Reports Ct Head Wo Contrast  Result Date: 01/04/2019 CLINICAL DATA:  Initial evaluation for acute altered mental status. EXAM: CT HEAD WITHOUT CONTRAST TECHNIQUE: Contiguous axial images were obtained from the base of the skull through the vertex without intravenous  contrast. COMPARISON:  Prior CT from 11/17/2018. FINDINGS: Brain: Age-related cerebral atrophy with mild chronic small vessel  ischemic disease, stable. There has been interval evolution of previously identified left thalamic hemorrhage, now late subacute in appearance with decreased size and attenuation as from previous. Mild residual subacute blood products measure approximately 2.0 x 1.6 cm. Residual surrounding vasogenic edema within this region with mild mass effect on the adjacent left lateral ventricle. Trace residual left-to-right midline shift. Previously seen intraventricular hemorrhage has largely resolved. Overall ventricular size mildly increased without frank hydrocephalus. Basilar cisterns remain patent. No other acute intracranial hemorrhage. No acute large vessel territory infarct. No other mass lesion. No extra-axial fluid collection. Vascular: No hyperdense vessel. Scattered vascular calcifications noted within the carotid siphons. Skull: Scalp soft tissues and calvarium within normal limits. Sinuses/Orbits: Globes orbital soft tissues demonstrate no acute finding. Mild scattered mucosal thickening within the ethmoidal air cells. Visualized paranasal sinuses are otherwise clear. No mastoid effusion. Other: None. IMPRESSION: 1. Normal expected interval evolution of late subacute left thalamic hemorrhage. Associated intraventricular hemorrhage has resolved. Overall ventricular size mildly increased from previous without frank hydrocephalus. 2. No other acute intracranial abnormality. 3. Underlying age-related cerebral atrophy with chronic small vessel ischemic disease. Electronically Signed   By: Jeannine Boga M.D.   On: 01/04/2019 18:16   Ct Abdomen Pelvis W Contrast  Result Date: 01/04/2019 CLINICAL DATA:  Initial evaluation for acute nausea, vomiting, diarrhea. EXAM: CT ABDOMEN AND PELVIS WITH CONTRAST TECHNIQUE: Multidetector CT imaging of the abdomen and pelvis was performed using the standard protocol following bolus administration of intravenous contrast. CONTRAST:  164mL ISOVUE-300 IOPAMIDOL (ISOVUE-300)  INJECTION 61% COMPARISON:  Prior CT from 11/14/2014. FINDINGS: Lower chest: Scattered subsegmental atelectatic changes present within the visualized lung bases. Trace left pleural effusion. Visualized lungs are otherwise grossly clear. Hepatobiliary: Irregular somewhat linear hypodensity within the central aspect of the right hepatic lobe noted, indeterminate, but stable from previous, and of doubtful significance given stability. Liver otherwise within normal limits. Gallbladder surgically absent. No biliary dilatation. Pancreas: Pancreas grossly within normal limits. Spleen: Unremarkable. Adrenals/Urinary Tract: Adrenal glands are normal. Kidneys equal in size with symmetric enhancement. No nephrolithiasis, hydronephrosis, or focal enhancing renal mass. No appreciable hydroureter. Bladder decompressed with a Foley catheter in place. Gas lucency within the bladder lumen compatible with catheterization. Stomach/Bowel: Percutaneous gastrostomy tube in place within the stomach. No adverse features. Focal diverticulum arising from the second portion of the duodenum, grossly similar to previous. No obvious associated inflammation on this motion degraded exam. No evidence for bowel obstruction. Colonic diverticulosis without evidence for acute diverticulitis. Circumferential wall thickening with edema about the rectosigmoid colon, suggesting acute proctitis. Hazy inflammatory stranding within the adjacent perirectal fat. No evidence for perforation or other complication. Vascular/Lymphatic: Normal intravascular enhancement seen throughout the intra-abdominal aorta. Moderate atherosclerotic change. No aneurysm. Mesenteric vessels patent proximally. No adenopathy. Reproductive: Prostate within normal limits. Other: Small fat containing left inguinal hernia noted without associated inflammation. No free air or fluid. Musculoskeletal: No acute osseous abnormality. No discrete lytic or blastic osseous lesions. IMPRESSION: 1.  Circumferential wall thickening and edema about the rectosigmoid colon, suggesting acute proctitis. No complication. 2. Colonic diverticulosis without evidence for acute diverticulitis. 3. Percutaneous gastrostomy tube in place without adverse features. 4. Trace left pleural effusion. Aortic Atherosclerosis (ICD10-I70.0). Electronically Signed   By: Jeannine Boga M.D.   On: 01/04/2019 18:27     CBC Recent Labs  Lab 12/31/18 0640 01/04/19 1445 01/05/19 0715 01/06/19 0620  WBC 7.4  12.6* 6.6 6.8  HGB 13.5 14.4 13.8 13.6  HCT 42.3 45.5 44.3 43.8  PLT 288 334 291 309  MCV 95.9 94.0 95.7 98.4  MCH 30.6 29.8 29.8 30.6  MCHC 31.9 31.6 31.2 31.1  RDW 12.9 12.9 13.2 13.2  LYMPHSABS  --   --  0.6*  --   MONOABS  --   --  0.8  --   EOSABS  --   --  0.1  --   BASOSABS  --   --  0.0  --     Chemistries  Recent Labs  Lab 12/31/18 0640 01/04/19 1445 01/05/19 0715 01/06/19 0620  NA 134* 139 141 147*  K 4.0 4.1 3.5 3.5  CL 97* 104 108 112*  CO2 29 28 26 28   GLUCOSE 239* 207* 116* 99  BUN 34* 42* 44* 40*  CREATININE 1.09 1.15 1.22 1.39*  CALCIUM 8.5* 8.6* 8.1* 8.1*  AST  --  33  --   --   ALT  --  47*  --   --   ALKPHOS  --  132*  --   --   BILITOT  --  0.8  --   --    ------------------------------------------------------------------------------------------------------------------ No results for input(s): CHOL, HDL, LDLCALC, TRIG, CHOLHDL, LDLDIRECT in the last 72 hours.  Lab Results  Component Value Date   HGBA1C 7.6 (H) 08/13/2012   ------------------------------------------------------------------------------------------------------------------ No results for input(s): TSH, T4TOTAL, T3FREE, THYROIDAB in the last 72 hours.  Invalid input(s): FREET3 ------------------------------------------------------------------------------------------------------------------ No results for input(s): VITAMINB12, FOLATE, FERRITIN, TIBC, IRON, RETICCTPCT in the last 72  hours.  Coagulation profile No results for input(s): INR, PROTIME in the last 168 hours.  No results for input(s): DDIMER in the last 72 hours.  Cardiac Enzymes No results for input(s): CKMB, TROPONINI, MYOGLOBIN in the last 168 hours.  Invalid input(s): CK ------------------------------------------------------------------------------------------------------------------ No results found for: BNP   Roxan Hockey M.D on 01/06/2019 at 3:28 PM  Go to [email or web address] - for contact info  [facility] - Office  [phone]

## 2019-01-07 LAB — GLUCOSE, CAPILLARY
GLUCOSE-CAPILLARY: 93 mg/dL (ref 70–99)
GLUCOSE-CAPILLARY: 151 mg/dL — AB (ref 70–99)
Glucose-Capillary: 108 mg/dL — ABNORMAL HIGH (ref 70–99)
Glucose-Capillary: 64 mg/dL — ABNORMAL LOW (ref 70–99)
Glucose-Capillary: 78 mg/dL (ref 70–99)
Glucose-Capillary: 93 mg/dL (ref 70–99)
Glucose-Capillary: 95 mg/dL (ref 70–99)

## 2019-01-07 LAB — BASIC METABOLIC PANEL
Anion gap: 5 (ref 5–15)
BUN: 29 mg/dL — ABNORMAL HIGH (ref 8–23)
CO2: 27 mmol/L (ref 22–32)
Calcium: 7.8 mg/dL — ABNORMAL LOW (ref 8.9–10.3)
Chloride: 113 mmol/L — ABNORMAL HIGH (ref 98–111)
Creatinine, Ser: 1.2 mg/dL (ref 0.61–1.24)
GFR calc Af Amer: >60 mL/min (ref >60)
GFR calc non Af Amer: 59 mL/min — ABNORMAL LOW (ref >60)
Glucose, Bld: 95 mg/dL (ref 70–99)
Potassium: 3.6 mmol/L (ref 3.5–5.1)
SODIUM: 145 mmol/L (ref 135–145)

## 2019-01-07 LAB — URINE CULTURE: Culture: >=100000 — AB

## 2019-01-07 MED ORDER — ORAL CARE MOUTH RINSE
15.0000 mL | Freq: Two times a day (BID) | OROMUCOSAL | Status: DC
  Administered 2019-01-08 – 2019-01-09 (×3): 15 mL via OROMUCOSAL

## 2019-01-07 MED ORDER — PANTOPRAZOLE SODIUM 40 MG PO PACK
40.0000 mg | PACK | Freq: Every day | ORAL | Status: DC
  Administered 2019-01-08 – 2019-01-09 (×2): 40 mg
  Filled 2019-01-07 (×2): qty 20

## 2019-01-07 MED ORDER — SODIUM CHLORIDE 0.45 % IV SOLN
INTRAVENOUS | Status: DC
  Administered 2019-01-07 – 2019-01-08 (×2): via INTRAVENOUS

## 2019-01-07 MED ORDER — SODIUM CHLORIDE 0.9 % IV SOLN
1.0000 g | Freq: Three times a day (TID) | INTRAVENOUS | Status: DC
  Administered 2019-01-07 – 2019-01-09 (×7): 1 g via INTRAVENOUS
  Filled 2019-01-07 (×10): qty 1

## 2019-01-07 MED ORDER — GLUCERNA 1.2 CAL PO LIQD
1000.0000 mL | ORAL | Status: DC
  Administered 2019-01-07 – 2019-01-09 (×2): 1000 mL
  Filled 2019-01-07 (×5): qty 1000

## 2019-01-07 NOTE — Clinical Social Work Note (Signed)
Clinical Social Work Assessment  Patient Details  Name: [name] MRN: [id number] Date of Birth: [birth year]  Date of referral:  01/07/19               Reason for consult:  Other (Comment Required)(From [facility] )                Permission sought to share information with:    Permission granted to share information::     Name::        Agency::  [facility] at [facility]  Relationship::     Contact Information:  Wife at bedside  Housing/Transportation Living arrangements for the past 2 months:  [place] of Information:  Facility, Spouse Patient Interpreter Needed:  None Criminal Activity/Legal Involvement Pertinent to Current Situation/Hospitalization:  No - Comment as needed Significant Relationships:  Adult Children Lives with:  Facility Resident Do you feel safe going back to the place where you live?  Yes Need for family participation in patient care:  Yes (Comment)  Care giving concerns:  None identified. Facility resident.    Social Worker assessment / plan:  Patient is a total care resident. He was admitted to the facility from [facility] after having a stroke.  Patient has a supportive wife.  Patient came to the facility for short term rehab but he is struggling in his recovery.  Patient can return to the facility at discharge.  Patient's wife confirms details and indicates that she wants patient to be discharged back to [facility].  No FL2 is needed for patient's return.  Kerri advised that patient will need IV Invanz at discharge.  LCSW will follow through discharge.   Employment status:  Retired Nurse, adult PT Recommendations:  Not assessed at this time Information / Referral to community resources:     Patient/Family's Response to care:  [name] reports that patient will discharge back to [facility] to complete short term rehab.   Patient/Family's Understanding of and Emotional Response to Diagnosis, Current Treatment, and Prognosis:  Spouse  understands his diagnosis, treatment and prognosis and feels patient needs to return to [facility] at discharge.  Emotional Assessment Appearance:  Appears stated age Attitude/Demeanor/Rapport:    Affect (typically observed):  Unable to Assess Orientation:  Oriented to Self Alcohol / Substance use:  Not Applicable Psych involvement (Current and /or in the community):  No (Comment)  Discharge Needs  Concerns to be addressed:  Other (Comment Required(return to [facility] ) Readmission within the last 30 days:  No Current discharge risk:  None Barriers to Discharge:  No Barriers Identified   Ihor Gully, LCSW 01/07/2019, 3:23 PM

## 2019-01-07 NOTE — Progress Notes (Signed)
Patient Demographics:    [name], is a 73 y.o. male, DOB - [birth year], FGH:[id number]  Admit date - 01/04/2019   Admitting Physician Vianne Bulls, MD  Outpatient Primary MD for the patient is [name], MD  LOS - 1   Chief Complaint  Patient presents with  . Emesis        Subjective:    [name] today has no fevers, no emesis,  No chest pain, patient is nonverbal,  no further vomiting or diarrhea  Assessment  & Plan :    Principal Problem:   Acute UTI Active Problems:   IDDM (insulin dependent diabetes mellitus) ([place])   HTN (hypertension)   Proctitis   History of hemorrhagic cerebrovascular accident (CVA) with residual deficit   Pressure injury of skin   UTI (urinary tract infection)  Brief summary 73 y.o. male with medical history significant for hypertension, insulin-dependent diabetes mellitus, coronary artery disease, and hemorrhagic CVA in December 2019 admitted from SNF on 01/04/2019 with diarrhea and emesis, C. difficile and GI pathogen are negative, urine culture with ESBL Klebsiella  Plan:- 1)Proctitis with Vomiting and Diarrhea--C. difficile negative, GI panel negative, CT abdomen and pelvis suggest proctitis, treat empirically Carbapenem antibiotic due to ESBL Klebsiella UTI, okay to stop IV Flagyl and Rocephin, continue antiemetics and PRN Imodium as ordered  2)FEN--- acute kidney injury and hypernatremia improving, sodium is down to 145, creatinine is down to 1.2, AKI was most likely due to GI losses as a result of vomiting and diarrhea compounded by decreased tube feed due to vomiting, no further emesis so okay to increase Glucerna to feed back to 50 mL an hour , goal is  70 mL an hour , consider increasing water flushes if hypernatremia persists  3)ESBL Klebsiella UTI--- WBC is down to 14.4,  lactic acid was normal, stop iV Rocephin , treat IV Carbapenem  antibiotic  4) history of prior hemorrhagic CVA--in December 2019, patient is aphasic, continue PEG tube feedings as above #2, continue statins  5)DM2-poor control, A1c 9.5 in 11/30/2018,--- continue insulin regimen including Lantus insulin 20 units  6)HTN---  stable at this time continue to hold Norvasc, okay to use low-dose metoprolol avoid abrupt discontinuation of beta-blocker  7)h/o CAD--- metoprolol as above #6, continue statin as above #4,  8)Sacral Decubitus--stage III sacral wound measuring 5 x 4.5 CM , it is about  0.5 CM deep, please see photo in EMR, wound care consult requested    Disposition/Need for in-Hospital Stay- patient unable to be discharged at this time due to Klebsiella UTI requiring IV antibiotic pending culture sensitivity report, as well as Proctitis with emesis and diarrhea with AKI and hypernatremia due to free water deficit and dehydrationi requiring IV hydration, antiemetics and antidiarrheal agents pending cultures*  Code Status : Full  Family Communication:   na   Disposition Plan  : SNF when acute issues resolve  Consults  :  sw  DVT Prophylaxis  :  Lovenox -\  Lab Results  Component Value Date   PLT 309 01/06/2019    Inpatient Medications  Scheduled Meds: . atorvastatin  80 mg Per Tube QHS  . Chlorhexidine Gluconate Cloth  6 each Topical Q0600  . enoxaparin (LOVENOX) injection  40 mg  Subcutaneous Q24H  . FLUoxetine  20 mg Per Tube Daily  . free water  200 mL Per Tube Q6H  . insulin aspart  0-5 Units Subcutaneous QHS  . insulin aspart  0-9 Units Subcutaneous TID WC  . insulin glargine  20 Units Subcutaneous Daily  . mouth rinse  15 mL Mouth Rinse BID  . Melatonin  3 mg Feeding Tube QHS  . metoprolol tartrate  12.5 mg Oral BID  . mupirocin ointment  1 application Nasal BID  . nystatin   Topical BID  . [START ON 01/08/2019] pantoprazole sodium  40 mg Per Tube Daily  . QUEtiapine  12.5 mg Per Tube BID  . sodium chloride flush  3 mL  Intravenous Once   Continuous Infusions: . sodium chloride    . sodium chloride 500 mL (01/05/19 1305)  . feeding supplement (GLUCERNA 1.2 CAL)    . meropenem (MERREM) IV 1 g (01/07/19 1053)   PRN Meds:.sodium chloride, acetaminophen **OR** acetaminophen, loperamide HCl, LORazepam, ondansetron **OR** ondansetron (ZOFRAN) IV    Anti-infectives (From admission, onward)   Start     Dose/Rate Route Frequency Ordered Stop   01/07/19 1100  meropenem (MERREM) 1 g in sodium chloride 0.9 % 100 mL IVPB     1 g 200 mL/hr over 30 Minutes Intravenous Every 8 hours 01/07/19 0941     01/05/19 1600  cefTRIAXone (ROCEPHIN) 1 g in sodium chloride 0.9 % 100 mL IVPB  Status:  Discontinued     1 g 200 mL/hr over 30 Minutes Intravenous Every 24 hours 01/04/19 1931 01/07/19 0941   01/05/19 0300  metroNIDAZOLE (FLAGYL) IVPB 500 mg  Status:  Discontinued     500 mg 100 mL/hr over 60 Minutes Intravenous Every 8 hours 01/04/19 1931 01/07/19 0941   01/04/19 1900  ciprofloxacin (CIPRO) IVPB 400 mg  Status:  Discontinued     400 mg 200 mL/hr over 60 Minutes Intravenous  Once 01/04/19 1853 01/04/19 1931   01/04/19 1900  metroNIDAZOLE (FLAGYL) tablet 500 mg     500 mg Oral  Once 01/04/19 1853 01/04/19 2028   01/04/19 1530  cefTRIAXone (ROCEPHIN) 1 g in sodium chloride 0.9 % 100 mL IVPB     1 g 200 mL/hr over 30 Minutes Intravenous  Once 01/04/19 1529 01/04/19 1725        Objective:   Vitals:   01/06/19 1425 01/06/19 2108 01/07/19 0932 01/07/19 1419  BP: 116/69 135/83  124/68  Pulse: 81 92  76  Resp: 16   16  Temp: 98.6 F (37 C)   (!) 97.3 F (36.3 C)  TempSrc: Oral   Oral  SpO2: 96%  96%   Weight:      Height:        Wt Readings from Last 3 Encounters:  01/05/19 72.7 kg  06/05/14 89.8 kg  03/06/14 91.1 kg     Intake/Output Summary (Last 24 hours) at 01/07/2019 1841 Last data filed at 01/07/2019 1700 Gross per 24 hour  Intake 2704.3 ml  Output 1400 ml  Net 1304.3 ml     Physical  Exam Patient is examined daily including today on 01/06/19 , exams remain the same as of yesterday except that has changed   Gen:- Awake Alert, nonverbal , aphasic due to recent stroke HEENT:- [place].AT, No sclera icterus Neck-Supple Neck,No JVD,.  Lungs-  CTAB , fair symmetrical air movement CV- S1, S2 normal, regular  Abd-  +ve B.Sounds, Abd Soft, No tenderness, PEG tube site is  clean dry and intact Extremity:- No  edema, pedal pulses present  Psych-affect is appropriate, patient is aphasic Neuro-neuromuscular deficits consistent with recent CVA, Skin- stage III sacral wound measuring 5 x 4.5 CM , it is about  0.5 CM deep, Media Information   Document Information   Photos    01/06/2019 10:24  Attached To:  Hospital Encounter on 01/04/19  Source Information   Roxan Hockey, MD  Ap-Dept 300     Data Review:   Micro Results Recent Results (from the past 240 hour(s))  Urine culture     Status: Abnormal   Collection Time: 01/04/19  6:53 PM  Result Value Ref Range Status   Specimen Description   Final    URINE, CLEAN CATCH Performed at [facility], [address] [place], [place] [address]    Special Requests   Final    NONE Performed at [facility], [address] [place], [place] [address]    Culture (A)  Final    >=100,000 COLONIES/mL KLEBSIELLA OXYTOCA Confirmed Extended Spectrum Beta-Lactamase Producer (ESBL).  In bloodstream infections from ESBL organisms, carbapenems are preferred over piperacillin/tazobactam. They are shown to have a lower risk of mortality.    Report Status 01/07/2019 FINAL  Final   Organism ID, Bacteria KLEBSIELLA OXYTOCA (A)  Final      Susceptibility   Klebsiella oxytoca - MIC*    AMPICILLIN >=32 RESISTANT Resistant     CEFAZOLIN >=64 RESISTANT Resistant     CEFTRIAXONE >=64 RESISTANT Resistant     CIPROFLOXACIN 2 INTERMEDIATE Intermediate     GENTAMICIN >=16 RESISTANT Resistant     IMIPENEM <=0.25 SENSITIVE Sensitive      NITROFURANTOIN 128 RESISTANT Resistant     TRIMETH/SULFA >=320 RESISTANT Resistant     AMPICILLIN/SULBACTAM >=32 RESISTANT Resistant     PIP/TAZO >=128 RESISTANT Resistant     Extended ESBL POSITIVE Resistant     * >=100,000 COLONIES/mL KLEBSIELLA OXYTOCA  MRSA PCR Screening     Status: Abnormal   Collection Time: 01/05/19 12:30 PM  Result Value Ref Range Status   MRSA by PCR POSITIVE (A) NEGATIVE Final    Comment:        The GeneXpert MRSA Assay (FDA approved for NASAL specimens only), is one component of a comprehensive MRSA colonization surveillance program. It is not intended to diagnose MRSA infection nor to guide or monitor treatment for MRSA infections. RESULT CALLED TO, READ BACK BY AND VERIFIED WITH: JACKSON,N@[email or web address]  BY MATTHEWS,B 1.25.2020 Performed at [facility], [address], [place], [place] [address]   C difficile quick scan w PCR reflex     Status: None   Collection Time: 01/05/19 12:50 PM  Result Value Ref Range Status   C Diff antigen NEGATIVE NEGATIVE Final   C Diff toxin NEGATIVE NEGATIVE Final   C Diff interpretation No C. difficile detected.  Final    Comment: Performed at [facility], [address], [place],  [address]  Gastrointestinal Panel by PCR , Stool     Status: None   Collection Time: 01/05/19 12:50 PM  Result Value Ref Range Status   Campylobacter species NOT DETECTED NOT DETECTED Final   Plesimonas shigelloides NOT DETECTED NOT DETECTED Final   Salmonella species NOT DETECTED NOT DETECTED Final   Yersinia enterocolitica NOT DETECTED NOT DETECTED Final   Vibrio species NOT DETECTED NOT DETECTED Final   Vibrio cholerae NOT DETECTED NOT DETECTED Final   Enteroaggregative E coli (EAEC) NOT DETECTED NOT DETECTED Final   Enteropathogenic  E coli (EPEC) NOT DETECTED NOT DETECTED Final   Enterotoxigenic E coli (ETEC) NOT DETECTED NOT DETECTED Final   Shiga like toxin producing E coli (STEC) NOT DETECTED NOT DETECTED Final    Shigella/Enteroinvasive E coli (EIEC) NOT DETECTED NOT DETECTED Final   Cryptosporidium NOT DETECTED NOT DETECTED Final   Cyclospora cayetanensis NOT DETECTED NOT DETECTED Final   Entamoeba histolytica NOT DETECTED NOT DETECTED Final   Giardia lamblia NOT DETECTED NOT DETECTED Final   Adenovirus F40/41 NOT DETECTED NOT DETECTED Final   Astrovirus NOT DETECTED NOT DETECTED Final   Norovirus GI/GII NOT DETECTED NOT DETECTED Final   Rotavirus A NOT DETECTED NOT DETECTED Final   Sapovirus (I, II, IV, and V) NOT DETECTED NOT DETECTED Final    Comment: Performed at [facility], [address] [place], [place] [address]    Radiology Reports Ct Head Wo Contrast  Result Date: 01/04/2019 CLINICAL DATA:  Initial evaluation for acute altered mental status. EXAM: CT HEAD WITHOUT CONTRAST TECHNIQUE: Contiguous axial images were obtained from the base of the skull through the vertex without intravenous contrast. COMPARISON:  Prior CT from 11/17/2018. FINDINGS: Brain: Age-related cerebral atrophy with mild chronic small vessel ischemic disease, stable. There has been interval evolution of previously identified left thalamic hemorrhage, now late subacute in appearance with decreased size and attenuation as from previous. Mild residual subacute blood products measure approximately 2.0 x 1.6 cm. Residual surrounding vasogenic edema within this region with mild mass effect on the adjacent left lateral ventricle. Trace residual left-to-right midline shift. Previously seen intraventricular hemorrhage has largely resolved. Overall ventricular size mildly increased without frank hydrocephalus. Basilar cisterns remain patent. No other acute intracranial hemorrhage. No acute large vessel territory infarct. No other mass lesion. No extra-axial fluid collection. Vascular: No hyperdense vessel. Scattered vascular calcifications noted within the carotid siphons. Skull: Scalp soft tissues and calvarium within normal  limits. Sinuses/Orbits: Globes orbital soft tissues demonstrate no acute finding. Mild scattered mucosal thickening within the ethmoidal air cells. Visualized paranasal sinuses are otherwise clear. No mastoid effusion. Other: None. IMPRESSION: 1. Normal expected interval evolution of late subacute left thalamic hemorrhage. Associated intraventricular hemorrhage has resolved. Overall ventricular size mildly increased from previous without frank hydrocephalus. 2. No other acute intracranial abnormality. 3. Underlying age-related cerebral atrophy with chronic small vessel ischemic disease. Electronically Signed   By: Jeannine Boga M.D.   On: 01/04/2019 18:16   Ct Abdomen Pelvis W Contrast  Result Date: 01/04/2019 CLINICAL DATA:  Initial evaluation for acute nausea, vomiting, diarrhea. EXAM: CT ABDOMEN AND PELVIS WITH CONTRAST TECHNIQUE: Multidetector CT imaging of the abdomen and pelvis was performed using the standard protocol following bolus administration of intravenous contrast. CONTRAST:  157mL ISOVUE-300 IOPAMIDOL (ISOVUE-300) INJECTION 61% COMPARISON:  Prior CT from 11/14/2014. FINDINGS: Lower chest: Scattered subsegmental atelectatic changes present within the visualized lung bases. Trace left pleural effusion. Visualized lungs are otherwise grossly clear. Hepatobiliary: Irregular somewhat linear hypodensity within the central aspect of the right hepatic lobe noted, indeterminate, but stable from previous, and of doubtful significance given stability. Liver otherwise within normal limits. Gallbladder surgically absent. No biliary dilatation. Pancreas: Pancreas grossly within normal limits. Spleen: Unremarkable. Adrenals/Urinary Tract: Adrenal glands are normal. Kidneys equal in size with symmetric enhancement. No nephrolithiasis, hydronephrosis, or focal enhancing renal mass. No appreciable hydroureter. Bladder decompressed with a Foley catheter in place. Gas lucency within the bladder lumen  compatible with catheterization. Stomach/Bowel: Percutaneous gastrostomy tube in place within the stomach. No adverse features. Focal diverticulum  arising from the second portion of the duodenum, grossly similar to previous. No obvious associated inflammation on this motion degraded exam. No evidence for bowel obstruction. Colonic diverticulosis without evidence for acute diverticulitis. Circumferential wall thickening with edema about the rectosigmoid colon, suggesting acute proctitis. Hazy inflammatory stranding within the adjacent perirectal fat. No evidence for perforation or other complication. Vascular/Lymphatic: Normal intravascular enhancement seen throughout the intra-abdominal aorta. Moderate atherosclerotic change. No aneurysm. Mesenteric vessels patent proximally. No adenopathy. Reproductive: Prostate within normal limits. Other: Small fat containing left inguinal hernia noted without associated inflammation. No free air or fluid. Musculoskeletal: No acute osseous abnormality. No discrete lytic or blastic osseous lesions. IMPRESSION: 1. Circumferential wall thickening and edema about the rectosigmoid colon, suggesting acute proctitis. No complication. 2. Colonic diverticulosis without evidence for acute diverticulitis. 3. Percutaneous gastrostomy tube in place without adverse features. 4. Trace left pleural effusion. Aortic Atherosclerosis (ICD10-I70.0). Electronically Signed   By: Jeannine Boga M.D.   On: 01/04/2019 18:27     CBC Recent Labs  Lab 01/04/19 1445 01/05/19 0715 01/06/19 0620  WBC 12.6* 6.6 6.8  HGB 14.4 13.8 13.6  HCT 45.5 44.3 43.8  PLT 334 291 309  MCV 94.0 95.7 98.4  MCH 29.8 29.8 30.6  MCHC 31.6 31.2 31.1  RDW 12.9 13.2 13.2  LYMPHSABS  --  0.6*  --   MONOABS  --  0.8  --   EOSABS  --  0.1  --   BASOSABS  --  0.0  --     Chemistries  Recent Labs  Lab 01/04/19 1445 01/05/19 0715 01/06/19 0620 01/07/19 0420  NA 139 141 147* 145  K 4.1 3.5 3.5 3.6  CL  104 108 112* 113*  CO2 28 26 28 27   GLUCOSE 207* 116* 99 95  BUN 42* 44* 40* 29*  CREATININE 1.15 1.22 1.39* 1.20  CALCIUM 8.6* 8.1* 8.1* 7.8*  AST 33  --   --   --   ALT 47*  --   --   --   ALKPHOS 132*  --   --   --   BILITOT 0.8  --   --   --    ------------------------------------------------------------------------------------------------------------------ No results for input(s): CHOL, HDL, LDLCALC, TRIG, CHOLHDL, LDLDIRECT in the last 72 hours.  Lab Results  Component Value Date   HGBA1C 7.6 (H) 08/13/2012   ------------------------------------------------------------------------------------------------------------------ No results for input(s): TSH, T4TOTAL, T3FREE, THYROIDAB in the last 72 hours.  Invalid input(s): FREET3 ------------------------------------------------------------------------------------------------------------------ No results for input(s): VITAMINB12, FOLATE, FERRITIN, TIBC, IRON, RETICCTPCT in the last 72 hours.  Coagulation profile No results for input(s): INR, PROTIME in the last 168 hours.  No results for input(s): DDIMER in the last 72 hours.  Cardiac Enzymes No results for input(s): CKMB, TROPONINI, MYOGLOBIN in the last 168 hours.  Invalid input(s): CK ------------------------------------------------------------------------------------------------------------------ No results found for: BNP   Roxan Hockey M.D on 01/07/2019 at 6:41 PM  Go to [email or web address] - for contact info  [facility] - Office  [phone]

## 2019-01-07 NOTE — Consult Note (Signed)
[place] Nurse wound consult note Reason for Consult: pressure injury, patient from [facility]. History of pressure injury Wound type: Stage 3 pressure injury: sacrum Pressure Injury POA: Yes Measurement: 5cm x 4.5cm x 0.5cm (per admission assessment and reviewed by [facility] nurse) Wound bed: clean, non granular, small amount of slough >25% Drainage (amount, consistency, odor) documented as none, scant, no odor Periwound: intact, skin otherwise unremarkable. Severe epibole of the wound edges particularly from 9-3 o'clock  Dressing procedure/placement/frequency: Add air mattress for pressure redistribution and moisture management Maximize nutrition for wound healing Hydrogel (amorphous saline gel) to wound base for moist wound healing. Top with dry dressing. Change daily.   Discussed POC with bedside nurse.  Discussed POC with patient and bedside nurse.  Re consult if needed, will not follow at this time. Thanks  Jazara Swiney [employer], RN,CWOCN, CNS, [place] [phone])

## 2019-01-08 DIAGNOSIS — E43 Unspecified severe protein-calorie malnutrition: Secondary | ICD-10-CM

## 2019-01-08 LAB — BASIC METABOLIC PANEL
Anion gap: 5 (ref 5–15)
BUN: 18 mg/dL (ref 8–23)
CHLORIDE: 104 mmol/L (ref 98–111)
CO2: 29 mmol/L (ref 22–32)
Calcium: 7.9 mg/dL — ABNORMAL LOW (ref 8.9–10.3)
Creatinine, Ser: 0.9 mg/dL (ref 0.61–1.24)
GFR calc Af Amer: >60 mL/min (ref >60)
GFR calc non Af Amer: >60 mL/min (ref >60)
Glucose, Bld: 211 mg/dL — ABNORMAL HIGH (ref 70–99)
Potassium: 3.7 mmol/L (ref 3.5–5.1)
Sodium: 138 mmol/L (ref 135–145)

## 2019-01-08 LAB — GLUCOSE, CAPILLARY
Glucose-Capillary: 188 mg/dL — ABNORMAL HIGH (ref 70–99)
Glucose-Capillary: 226 mg/dL — ABNORMAL HIGH (ref 70–99)
Glucose-Capillary: 126 mg/dL — ABNORMAL HIGH (ref 70–99)
Glucose-Capillary: 71 mg/dL (ref 70–99)

## 2019-01-08 MED ORDER — PRO-STAT SUGAR FREE PO LIQD
30.0000 mL | Freq: Every day | ORAL | Status: DC
  Administered 2019-01-09: 30 mL
  Filled 2019-01-08: qty 30

## 2019-01-08 MED ORDER — AMLODIPINE BESYLATE 5 MG PO TABS
5.0000 mg | ORAL_TABLET | Freq: Every day | ORAL | Status: DC
  Administered 2019-01-08 – 2019-01-09 (×2): 5 mg via ORAL
  Filled 2019-01-08 (×2): qty 1

## 2019-01-08 NOTE — Progress Notes (Signed)
Nutrition Follow-up  DOCUMENTATION CODES:  Severe malnutrition in context of acute illness/injury  INTERVENTION:  Advancing TF per MD discretion.   Goal Rate Remains: Glucerna 1.2 @ 70 ml/hr via PEG w/ 200 cc free water flush q 6 hrs  +Prostat Q 24 hrs   Tube feeding regimen provides 2116 kcals, 116grams of protein, and 1352 ml of H2O (+800 cc from flushes)  NUTRITION DIAGNOSIS:  Severe Malnutrition(Acute context) related to CVA and subsequent acute illnesses as evidenced by moderate muscle loss and loss of >10% bw in <2 months  GOAL:  Patient will meet greater than or equal to 90% of their needs  MONITOR:  PO intake, Diet advancement, Skin, TF tolerance, Weight trends, I & O's  ASSESSMENT:  73 y/o male w/ PMHx DM2, HTN/HLD, Prostate Cancer. Hospitalized 12/8-1/10 for ICH/IVH w/ residual dysphagia, R hemiplegia and global encephalopathy. NPO on TF s/p peg. Hospital course c/b PNA/UTI. Was discharged to SNF for ongoing rehab. Now presents to ED with progressive worsening of loose stools over past several days and acute emesis. Work up revealed acute proctitis and UTI. Admitted for management.   Interval History: 1/25: TF started in morning: Glucerna 1.2 @ 70 - cut back to 35cc by MD later in evening d/t diarrhea. 1/26: TF advanced back to 50 1/27: TF held at 50 cc  RD following up regarding TF. While pt much more has has considerable improvement in his ability to speak since RD last seen, he still is unable to offer any information. His abdomen was soft, nondistended. Repeated physical exam. Spoke w/ RN. She reports that stools have much improved, but is still slightly more loose than what would be expected on TF.  Pt had presented to [facility] at weight of 182.5 lbs (12/9). Left at 176.6 (1/9). When left SNF 1/20: was 168.4 lbs. Today he is 160 lbs.  Has lost ~22.5 lbs (12% bw) in <2 months  Renal labs improving. BGs have increased over past 24 hrs-> likely d/t TF advancement.  Hyponatremic Today-> unclear in what setting (diarhea?) - per md management.    MD has been titrating TF that past few days. Will defer TF advancement to MD   Labs: BUN/Creat: 44/1.22-> BUN/Creat:18/.9, Albumin: 2.7, WBC:12.6, BGs  More recently: 150-230, Sodium 145-> 138 Meds: Insulin, Prostat, PPI, Seroquel, IV abx  Recent Labs  Lab 01/06/19 0620 01/07/19 0420 01/08/19 0859  NA 147* 145 138  K 3.5 3.6 3.7  CL 112* 113* 104  CO2 28 27 29   BUN 40* 29* 18  CREATININE 1.39* 1.20 0.90  CALCIUM 8.1* 7.8* 7.9*  GLUCOSE 99 95 211*   NUTRITION - FOCUSED PHYSICAL EXAM:   Most Recent Value  Orbital Region  Mild depletion  Upper Arm Region  Mild depletion  Thoracic and Lumbar Region  No depletion  Buccal Region  No depletion  Temple Region  Mild depletion  Clavicle Bone Region  Mild depletion  Clavicle and Acromion Bone Region  No depletion  Scapular Bone Region  Unable to assess  Dorsal Hand  No depletion  Patellar Region  No depletion  Anterior Thigh Region  Moderate depletion  Posterior Calf Region  Moderate depletion     Diet Order:   Diet Order    None     EDUCATION NEEDS:  No education needs have been identified at this time  Skin:  Pressure Ulcer stage III to sacrum   Last BM: 1/27  Height:  Ht Readings from Last 1 Encounters:  01/04/19 6' (  1.829 m)   Weight:  Wt Readings from Last 1 Encounters:  01/05/19 72.7 kg   Wt Readings from Last 10 Encounters:  01/05/19 72.7 kg  06/05/14 89.8 kg  03/06/14 91.1 kg  10/28/13 93 kg  05/01/13 89.4 kg  01/03/13 87.3 kg  10/22/12 79.9 kg  09/28/12 81.2 kg  09/18/12 80.7 kg  09/03/12 84.7 kg   Ideal Body Weight:  80.91 kg  BMI:  Body mass index is 21.74 kg/m.  Estimated Nutritional Needs:  Kcal:  2050-2250 kcals (28-31 kcal/kg bw) Protein:  100-115g Pro (1.4-1.6g/kg bw) Fluid:  2-2.2 L (1 ml/kcal) + enough to replace stool losses  Burtis Junes RD, LDN, CNSC Clinical Nutrition Available Tues-Sat via  Pager: [phone] 01/08/2019 2:59 PM

## 2019-01-08 NOTE — Progress Notes (Signed)
Noted PICC order.  Plan to place tomorrow.

## 2019-01-08 NOTE — Progress Notes (Addendum)
Patient Demographics:    [name], is a 73 y.o. male, DOB - 1946/10/02, XYI:[id number]  Admit date - 01/04/2019   Admitting Physician Tanner Bulls, MD  Outpatient Primary MD for the patient is Tanner Percy, MD  LOS - 2   Chief Complaint  Patient presents with  . Emesis        Subjective:    [name] today has no fevers, no emesis,  No chest pain, patient is nonverbal,  no further vomiting , wife at bedside, questions answered  Assessment  & Plan :    Principal Problem:   Acute UTI Active Problems:   IDDM (insulin dependent diabetes mellitus) ([place])   HTN (hypertension)   Proctitis   History of hemorrhagic cerebrovascular accident (CVA) with residual deficit   Pressure injury of skin   UTI (urinary tract infection)   Protein-calorie malnutrition, severe  Brief summary 73 y.o. male with medical history significant for hypertension, insulin-dependent diabetes mellitus, coronary artery disease, and hemorrhagic CVA in December 2019 admitted from SNF on 01/04/2019 with diarrhea and emesis, C. difficile and GI pathogen are negative, urine culture with ESBL Klebsiella   Plan:- 1)Proctitis with Vomiting and Diarrhea--C. difficile negative, GI panel negative, CT abdomen and pelvis suggest proctitis, continue with IV meropenem, upon discharge may be switched to IV a dependent for ease of administration at facility , Carbapenem antibiotic was chosen due to ESBL Klebsiella UTI, okay to stop IV Flagyl and Rocephin, continue antiemetics and PRN Imodium as ordered  2)FEN--- acute kidney injury and hypernatremia improving, sodium is down to 138, creatinine is down to 0.9  From 1.39, AKI was most likely due to GI losses as a result of vomiting and diarrhea compounded by decreased tube feed due to vomiting, no further emesis c/n Glucerna to feed back at 50 mL an hour , goal is  70 mL an hour , continue water  flushes of 200 mL every 6 hours  3) Foley catheter associated ESBL Klebsiella UTI--- patient with chronic indwelling Foley catheter, WBC is down to 6.8 from 12.6,  lactic acid was normal, stop iV Rocephin , treat IV Carbapenem antibiotic as above #1, patient may return to SNF with PICC line to complete IV ertapenem (okay to treat with IV meropenem while in the hospital)  4) history of prior hemorrhagic CVA--in December 2019, patient is aphasic, continue PEG tube feedings as above #2, continue statins  5)DM2-poor control, A1c 9.5 in 11/30/2018,--- continue insulin regimen including Lantus insulin 20 units  6)HTN---  Stable, resume amlodipine at 5 mg daily, continue metoprolol   7)h/o CAD--- metoprolol as above #6, continue statin as above #4,  8)Sacral Decubitus--stage III sacral wound measuring 5 x 4.5 CM , it is about  0.5 CM deep, please see photo in EMR, wound care consult requested   Disposition/Need for in-Hospital Stay- patient unable to be discharged at this time due to Klebsiella UTI requiring IV antibiotic -awaiting insurance approval for return to SNF rehab  patient may return to SNF with PICC line to complete IV ertapenem (okay to treat with IV meropenem while in the hospital)  Code Status : Full  Family Communication: Wife at bedside   Disposition Plan  : Pending insurance approval for return to  SNF rehab Consults  :  sw  DVT Prophylaxis  :  Lovenox   Lab Results  Component Value Date   PLT 309 01/06/2019    Inpatient Medications  Scheduled Meds: . atorvastatin  80 mg Per Tube QHS  . Chlorhexidine Gluconate Cloth  6 each Topical Q0600  . enoxaparin (LOVENOX) injection  40 mg Subcutaneous Q24H  . feeding supplement (PRO-STAT SUGAR FREE 64)  30 mL Per Tube Daily  . FLUoxetine  20 mg Per Tube Daily  . free water  200 mL Per Tube Q6H  . insulin aspart  0-5 Units Subcutaneous QHS  . insulin aspart  0-9 Units Subcutaneous TID WC  . insulin glargine  20 Units  Subcutaneous Daily  . mouth rinse  15 mL Mouth Rinse BID  . Melatonin  3 mg Feeding Tube QHS  . metoprolol tartrate  12.5 mg Oral BID  . mupirocin ointment  1 application Nasal BID  . nystatin   Topical BID  . pantoprazole sodium  40 mg Per Tube Daily  . QUEtiapine  12.5 mg Per Tube BID  . sodium chloride flush  3 mL Intravenous Once   Continuous Infusions: . sodium chloride 50 mL/hr at 01/08/19 0623  . sodium chloride 500 mL (01/05/19 1305)  . feeding supplement (GLUCERNA 1.2 CAL)    . meropenem (MERREM) IV 1 g (01/08/19 0957)   PRN Meds:.sodium chloride, acetaminophen **OR** acetaminophen, loperamide HCl, LORazepam, ondansetron **OR** ondansetron (ZOFRAN) IV    Anti-infectives (From admission, onward)   Start     Dose/Rate Route Frequency Ordered Stop   01/07/19 1100  meropenem (MERREM) 1 g in sodium chloride 0.9 % 100 mL IVPB     1 g 200 mL/hr over 30 Minutes Intravenous Every 8 hours 01/07/19 0941     01/05/19 1600  cefTRIAXone (ROCEPHIN) 1 g in sodium chloride 0.9 % 100 mL IVPB  Status:  Discontinued     1 g 200 mL/hr over 30 Minutes Intravenous Every 24 hours 01/04/19 1931 01/07/19 0941   01/05/19 0300  metroNIDAZOLE (FLAGYL) IVPB 500 mg  Status:  Discontinued     500 mg 100 mL/hr over 60 Minutes Intravenous Every 8 hours 01/04/19 1931 01/07/19 0941   01/04/19 1900  ciprofloxacin (CIPRO) IVPB 400 mg  Status:  Discontinued     400 mg 200 mL/hr over 60 Minutes Intravenous  Once 01/04/19 1853 01/04/19 1931   01/04/19 1900  metroNIDAZOLE (FLAGYL) tablet 500 mg     500 mg Oral  Once 01/04/19 1853 01/04/19 2028   01/04/19 1530  cefTRIAXone (ROCEPHIN) 1 g in sodium chloride 0.9 % 100 mL IVPB     1 g 200 mL/hr over 30 Minutes Intravenous  Once 01/04/19 1529 01/04/19 1725        Objective:   Vitals:   01/07/19 2140 01/08/19 0642 01/08/19 0642 01/08/19 1434  BP: (!) 158/79 (!) 149/79 (!) 149/79 (!) 150/77  Pulse: 82 79 79 69  Resp:    18  Temp: 97.9 F (36.6 C) 97.6 F  (36.4 C) 97.6 F (36.4 C) (!) 97.5 F (36.4 C)  TempSrc: Oral Oral Oral Oral  SpO2: 98% 99% 99% 98%  Weight:      Height:        Wt Readings from Last 3 Encounters:  01/05/19 72.7 kg  06/05/14 89.8 kg  03/06/14 91.1 kg     Intake/Output Summary (Last 24 hours) at 01/08/2019 1859 Last data filed at 01/08/2019 1600 Gross per 24 hour  Intake 1453.33 ml  Output 5000 ml  Net -3546.67 ml     Physical Exam Patient is examined daily including today on 01/08/19 , exams remain the same as of yesterday except that has changed   Gen:- Awake Alert, nonverbal , aphasic due to recent stroke HEENT:- [place].AT, No sclera icterus Neck-Supple Neck,No JVD,.  Lungs-  CTAB , fair symmetrical air movement CV- S1, S2 normal, regular  Abd-  +ve B.Sounds, Abd Soft, No tenderness, PEG tube site is clean dry and intact Extremity:- No  edema, pedal pulses present  Psych-affect is appropriate, patient is aphasic Neuro-neuromuscular deficits consistent with recent CVA, GU----chronic indwelling Foley Skin- stage III sacral wound measuring 5 x 4.5 CM , it is about  0.5 CM deep, Media Information   Document Information   Photos    01/06/2019 10:24  Attached To:  Hospital Encounter on 01/04/19  Source Information   Roxan Hockey, MD  Ap-Dept 300     Data Review:   Micro Results Recent Results (from the past 240 hour(s))  Urine culture     Status: Abnormal   Collection Time: 01/04/19  6:53 PM  Result Value Ref Range Status   Specimen Description   Final    URINE, CLEAN CATCH Performed at [facility], [address], [place], [place] [address]    Special Requests   Final    NONE Performed at [facility], [address], [place], [place] [address]    Culture (A)  Final    >=100,000 COLONIES/mL KLEBSIELLA OXYTOCA Confirmed Extended Spectrum Beta-Lactamase Producer (ESBL).  In bloodstream infections from ESBL organisms, carbapenems are preferred over piperacillin/tazobactam. They are  shown to have a lower risk of mortality.    Report Status 01/07/2019 FINAL  Final   Organism ID, Bacteria KLEBSIELLA OXYTOCA (A)  Final      Susceptibility   Klebsiella oxytoca - MIC*    AMPICILLIN >=32 RESISTANT Resistant     CEFAZOLIN >=64 RESISTANT Resistant     CEFTRIAXONE >=64 RESISTANT Resistant     CIPROFLOXACIN 2 INTERMEDIATE Intermediate     GENTAMICIN >=16 RESISTANT Resistant     IMIPENEM <=0.25 SENSITIVE Sensitive     NITROFURANTOIN 128 RESISTANT Resistant     TRIMETH/SULFA >=320 RESISTANT Resistant     AMPICILLIN/SULBACTAM >=32 RESISTANT Resistant     PIP/TAZO >=128 RESISTANT Resistant     Extended ESBL POSITIVE Resistant     * >=100,000 COLONIES/mL KLEBSIELLA OXYTOCA  MRSA PCR Screening     Status: Abnormal   Collection Time: 01/05/19 12:30 PM  Result Value Ref Range Status   MRSA by PCR POSITIVE (A) NEGATIVE Final    Comment:        The GeneXpert MRSA Assay (FDA approved for NASAL specimens only), is one component of a comprehensive MRSA colonization surveillance program. It is not intended to diagnose MRSA infection nor to guide or monitor treatment for MRSA infections. RESULT CALLED TO, READ BACK BY AND VERIFIED WITH: JACKSON,[email or web address]  BY MATTHEWS,B 1.25.2020 Performed at [facility], [address], [place], [place] [address]   C difficile quick scan w PCR reflex     Status: None   Collection Time: 01/05/19 12:50 PM  Result Value Ref Range Status   C Diff antigen NEGATIVE NEGATIVE Final   C Diff toxin NEGATIVE NEGATIVE Final   C Diff interpretation No C. difficile detected.  Final    Comment: Performed at [facility], [address], [place], [place] [address]  Gastrointestinal Panel by PCR , Stool  Status: None   Collection Time: 01/05/19 12:50 PM  Result Value Ref Range Status   Campylobacter species NOT DETECTED NOT DETECTED Final   Plesimonas shigelloides NOT DETECTED NOT DETECTED Final   Salmonella species NOT DETECTED NOT DETECTED Final    Yersinia enterocolitica NOT DETECTED NOT DETECTED Final   Vibrio species NOT DETECTED NOT DETECTED Final   Vibrio cholerae NOT DETECTED NOT DETECTED Final   Enteroaggregative E coli (EAEC) NOT DETECTED NOT DETECTED Final   Enteropathogenic E coli (EPEC) NOT DETECTED NOT DETECTED Final   Enterotoxigenic E coli (ETEC) NOT DETECTED NOT DETECTED Final   Shiga like toxin producing E coli (STEC) NOT DETECTED NOT DETECTED Final   Shigella/Enteroinvasive E coli (EIEC) NOT DETECTED NOT DETECTED Final   Cryptosporidium NOT DETECTED NOT DETECTED Final   Cyclospora cayetanensis NOT DETECTED NOT DETECTED Final   Entamoeba histolytica NOT DETECTED NOT DETECTED Final   Giardia lamblia NOT DETECTED NOT DETECTED Final   Adenovirus F40/41 NOT DETECTED NOT DETECTED Final   Astrovirus NOT DETECTED NOT DETECTED Final   Norovirus GI/GII NOT DETECTED NOT DETECTED Final   Rotavirus A NOT DETECTED NOT DETECTED Final   Sapovirus (I, II, IV, and V) NOT DETECTED NOT DETECTED Final    Comment: Performed at [facility], [address], [place], [place] [address]    Radiology Reports Ct Head Wo Contrast  Result Date: 01/04/2019 CLINICAL DATA:  Initial evaluation for acute altered mental status. EXAM: CT HEAD WITHOUT CONTRAST TECHNIQUE: Contiguous axial images were obtained from the base of the skull through the vertex without intravenous contrast. COMPARISON:  Prior CT from 11/17/2018. FINDINGS: Brain: Age-related cerebral atrophy with mild chronic small vessel ischemic disease, stable. There has been interval evolution of previously identified left thalamic hemorrhage, now late subacute in appearance with decreased size and attenuation as from previous. Mild residual subacute blood products measure approximately 2.0 x 1.6 cm. Residual surrounding vasogenic edema within this region with mild mass effect on the adjacent left lateral ventricle. Trace residual left-to-right midline shift. Previously seen  intraventricular hemorrhage has largely resolved. Overall ventricular size mildly increased without frank hydrocephalus. Basilar cisterns remain patent. No other acute intracranial hemorrhage. No acute large vessel territory infarct. No other mass lesion. No extra-axial fluid collection. Vascular: No hyperdense vessel. Scattered vascular calcifications noted within the carotid siphons. Skull: Scalp soft tissues and calvarium within normal limits. Sinuses/Orbits: Globes orbital soft tissues demonstrate no acute finding. Mild scattered mucosal thickening within the ethmoidal air cells. Visualized paranasal sinuses are otherwise clear. No mastoid effusion. Other: None. IMPRESSION: 1. Normal expected interval evolution of late subacute left thalamic hemorrhage. Associated intraventricular hemorrhage has resolved. Overall ventricular size mildly increased from previous without frank hydrocephalus. 2. No other acute intracranial abnormality. 3. Underlying age-related cerebral atrophy with chronic small vessel ischemic disease. Electronically Signed   By: Jeannine Boga M.D.   On: 01/04/2019 18:16   Ct Abdomen Pelvis W Contrast  Result Date: 01/04/2019 CLINICAL DATA:  Initial evaluation for acute nausea, vomiting, diarrhea. EXAM: CT ABDOMEN AND PELVIS WITH CONTRAST TECHNIQUE: Multidetector CT imaging of the abdomen and pelvis was performed using the standard protocol following bolus administration of intravenous contrast. CONTRAST:  144mL ISOVUE-300 IOPAMIDOL (ISOVUE-300) INJECTION 61% COMPARISON:  Prior CT from 11/14/2014. FINDINGS: Lower chest: Scattered subsegmental atelectatic changes present within the visualized lung bases. Trace left pleural effusion. Visualized lungs are otherwise grossly clear. Hepatobiliary: Irregular somewhat linear hypodensity within the central aspect of the right hepatic lobe noted, indeterminate, but stable  from previous, and of doubtful significance given stability. Liver  otherwise within normal limits. Gallbladder surgically absent. No biliary dilatation. Pancreas: Pancreas grossly within normal limits. Spleen: Unremarkable. Adrenals/Urinary Tract: Adrenal glands are normal. Kidneys equal in size with symmetric enhancement. No nephrolithiasis, hydronephrosis, or focal enhancing renal mass. No appreciable hydroureter. Bladder decompressed with a Foley catheter in place. Gas lucency within the bladder lumen compatible with catheterization. Stomach/Bowel: Percutaneous gastrostomy tube in place within the stomach. No adverse features. Focal diverticulum arising from the second portion of the duodenum, grossly similar to previous. No obvious associated inflammation on this motion degraded exam. No evidence for bowel obstruction. Colonic diverticulosis without evidence for acute diverticulitis. Circumferential wall thickening with edema about the rectosigmoid colon, suggesting acute proctitis. Hazy inflammatory stranding within the adjacent perirectal fat. No evidence for perforation or other complication. Vascular/Lymphatic: Normal intravascular enhancement seen throughout the intra-abdominal aorta. Moderate atherosclerotic change. No aneurysm. Mesenteric vessels patent proximally. No adenopathy. Reproductive: Prostate within normal limits. Other: Small fat containing left inguinal hernia noted without associated inflammation. No free air or fluid. Musculoskeletal: No acute osseous abnormality. No discrete lytic or blastic osseous lesions. IMPRESSION: 1. Circumferential wall thickening and edema about the rectosigmoid colon, suggesting acute proctitis. No complication. 2. Colonic diverticulosis without evidence for acute diverticulitis. 3. Percutaneous gastrostomy tube in place without adverse features. 4. Trace left pleural effusion. Aortic Atherosclerosis (ICD10-I70.0). Electronically Signed   By: Jeannine Boga M.D.   On: 01/04/2019 18:27     CBC Recent Labs  Lab  01/04/19 1445 01/05/19 0715 01/06/19 0620  WBC 12.6* 6.6 6.8  HGB 14.4 13.8 13.6  HCT 45.5 44.3 43.8  PLT 334 291 309  MCV 94.0 95.7 98.4  MCH 29.8 29.8 30.6  MCHC 31.6 31.2 31.1  RDW 12.9 13.2 13.2  LYMPHSABS  --  0.6*  --   MONOABS  --  0.8  --   EOSABS  --  0.1  --   BASOSABS  --  0.0  --     Chemistries  Recent Labs  Lab 01/04/19 1445 01/05/19 0715 01/06/19 0620 01/07/19 0420 01/08/19 0859  NA 139 141 147* 145 138  K 4.1 3.5 3.5 3.6 3.7  CL 104 108 112* 113* 104  CO2 28 26 28 27 29   GLUCOSE 207* 116* 99 95 211*  BUN 42* 44* 40* 29* 18  CREATININE 1.15 1.22 1.39* 1.20 0.90  CALCIUM 8.6* 8.1* 8.1* 7.8* 7.9*  AST 33  --   --   --   --   ALT 47*  --   --   --   --   ALKPHOS 132*  --   --   --   --   BILITOT 0.8  --   --   --   --    ------------------------------------------------------------------------------------------------------------------ No results for input(s): CHOL, HDL, LDLCALC, TRIG, CHOLHDL, LDLDIRECT in the last 72 hours.  Lab Results  Component Value Date   HGBA1C 7.6 (H) 08/13/2012   ------------------------------------------------------------------------------------------------------------------ No results for input(s): TSH, T4TOTAL, T3FREE, THYROIDAB in the last 72 hours.  Invalid input(s): FREET3 ------------------------------------------------------------------------------------------------------------------ No results for input(s): VITAMINB12, FOLATE, FERRITIN, TIBC, IRON, RETICCTPCT in the last 72 hours.  Coagulation profile No results for input(s): INR, PROTIME in the last 168 hours.  No results for input(s): DDIMER in the last 72 hours.  Cardiac Enzymes No results for input(s): CKMB, TROPONINI, MYOGLOBIN in the last 168 hours.  Invalid input(s): CK ------------------------------------------------------------------------------------------------------------------ No results found for: BNP   Eldredge Veldhuizen M.D  on 01/08/2019 at  6:59 PM  Go to [email or web address] - for contact info  [facility] - Office  [phone]

## 2019-01-09 ENCOUNTER — Inpatient Hospital Stay
Admission: RE | Admit: 2019-01-09 | Discharge: 2019-02-11 | Disposition: A | Discharge status: Other Acute Inpatient Hospital | Payer: Medicare Other | Source: Ambulatory Visit | Attending: Internal Medicine | Admitting: Internal Medicine

## 2019-01-09 DIAGNOSIS — T83511A Infection and inflammatory reaction due to indwelling urethral catheter, initial encounter: Secondary | ICD-10-CM

## 2019-01-09 DIAGNOSIS — E43 Unspecified severe protein-calorie malnutrition: Secondary | ICD-10-CM

## 2019-01-09 LAB — GLUCOSE, CAPILLARY
Glucose-Capillary: 178 mg/dL — ABNORMAL HIGH (ref 70–99)
Glucose-Capillary: 183 mg/dL — ABNORMAL HIGH (ref 70–99)

## 2019-01-09 MED ORDER — INSULIN GLARGINE 100 UNIT/ML ~~LOC~~ SOLN: 20.0000 [IU] | Freq: Every day | SUBCUTANEOUS | 0 refills | Status: DC

## 2019-01-09 MED ORDER — METOPROLOL TARTRATE 25 MG PO TABS: 12.5000 mg | ORAL_TABLET | Freq: Two times a day (BID) | ORAL | 0 refills | Status: DC

## 2019-01-09 MED ORDER — FREE WATER: 200.0000 mL | Freq: Four times a day (QID) | Status: DC

## 2019-01-09 MED ORDER — LORAZEPAM 0.5 MG PO TABS: 0.5000 mg | ORAL_TABLET | Freq: Four times a day (QID) | ORAL | 0 refills | Status: DC | PRN

## 2019-01-09 MED ORDER — SODIUM CHLORIDE 0.9 % IV SOLN: 1.0000 g | INTRAVENOUS | 0 refills | Status: DC

## 2019-01-09 MED ORDER — AMLODIPINE BESYLATE 5 MG PO TABS: 5.0000 mg | ORAL_TABLET | Freq: Every day | ORAL | 0 refills | Status: DC

## 2019-01-09 MED ORDER — GLUCERNA 1.2 CAL PO LIQD
1000.0000 mL | ORAL | Status: DC
  Filled 2019-01-09 (×2): qty 1000

## 2019-01-09 MED ORDER — SODIUM CHLORIDE 0.9% FLUSH: 10.0000 mL | INTRAVENOUS | Status: DC | PRN

## 2019-01-09 MED ORDER — SODIUM CHLORIDE 0.9% FLUSH: 10.0000 mL | Freq: Two times a day (BID) | INTRAVENOUS | Status: DC

## 2019-01-09 NOTE — Discharge Summary (Addendum)
Physician Discharge Summary  [name] XTG:[id number] DOB: 1946-04-07 DOA: 01/04/2019  PCP: [name], MD  Admit date: 01/04/2019 Discharge date: 01/09/2019  Admitted From: SNF Disposition:  SNF  Recommendations for Outpatient Follow-up:  1. Follow up with PCP in 1-2 weeks 2. Please obtain BMP/CBC in one week 3. Start ertapenem 1 g daily--last day 01/16/2019 4. Discontinue PICC line after last dose of ertapenem on 01/16/2019    Discharge Condition: Stable CODE STATUS: FULL Diet recommendation: Glucerna 1.2 at 65 cc/h   Brief/Interim Summary: 73 y.o. male with medical history significant for hypertension, insulin-dependent diabetes mellitus, coronary artery disease, and hemorrhagic CVA in December 2019 presented to the ED on 01/04/2019 with vomiting and diarrhea.  His C. difficile was negative.  The patient was noted to be more restless and agitated than usual.  He has a chronic indwelling Foley catheter.   Upon arrival to the ED, patient is found to be afebrile, saturating well on room air, blood pressure 90/57, and vitals otherwise normal.  EKG features sinus rhythm with PVCs and nonspecific IVCD.  Noncontrast head CT reveals interval evolution of the late subacute left thalamic hemorrhage as expected.  CT abdomen pelvis reveals wall thickening and edema about the rectosigmoid colon concerning for proctitis.  Chemistry panel is notable for a BUN of 42 and glucose of 207.  CBC features a leukocytosis to 12,600.  Urinalysis is suggestive of infection.  Lactic acid is normal.  Patient was given 500 cc normal saline, Rocephin, ciprofloxacin, and Flagyl in the ED.   during the hospitalization, the patient was treated with ceftriaxone and Metronidazole. When his urine culture returned and showed ESBL Klebsiella, his antibiotics were switched to meropenem.  The patient improved clinically with improvement of his vomiting and diarrhea.  The patient remained afebrile hemodynamically stable.  He  tolerated his enteral feedings.  PICC line was placed on 01/09/2019.  He will be discharged back to the skilled nursing facility to finish 7 additional days of ertapenem which would complete 10 days of therapy.  Discharge Diagnoses:  Acute proctitis -C. difficile and GI pathogen panel negative -CT abdomen and pelvis suggested proctitis as discussed above -The patient was switched to meropenem due to the presence of ESBL Klebsiella UTI -He will be switched to IV ertapenem at the time of discharge to finish 7 additional days of antibiotics which will complete a 10-day course--last day of abx on 01/16/2019  CKD stage III -Baseline creatinine 0.9-1.2 -Serum creatinine 0.90 at the time of discharge  Dehydration -Improved with IV fluids -Presented with serum creatinine 1.39  Catheter associated ESBL UTI -Urine culture showed ESBL Klebsiella oxytoca -Patient was started on meropenem -He will be discharged with ertapenem for ease of administration -WBC improved to 6.8 from 12.6 -The patient presented with an indwelling Foley catheter  History of hemorrhagic CVA/ICH -The patient was hospitalized for over 1 month at [facility] -At baseline, the patient is dysarthric and dysphasic -continue Glucerna 1.2 @ 60 cc/hr  Diabetes mellitus type 2, uncontrolled with hyperglycemia -11/19/2018 hemoglobin A1c 9.6 -Continue Lantus and NovoLog sliding scale  Sacral Decubitus--stage III sacral wound measuring 5 x 4.5 CM , it is about  0.5 CM deep, please see photo in EMR, wound care consult requested  Essential hypertension -Continue amlodipine  Coronary artery disease -No chest pain presently -Continue amlodipine and statin   Discharge Instructions   Allergies as of 01/09/2019   No Known Allergies     Medication List    TAKE  these medications   amLODipine 5 MG tablet Commonly known as:  NORVASC Take 1 tablet (5 mg total) by mouth daily. Start taking on:  January 10, 2019 What  changed:    medication strength  how much to take  how to take this  additional instructions   atorvastatin 80 MG tablet Commonly known as:  LIPITOR Place 80 mg into feeding tube at bedtime.   Dimethicone 1.3 % Lotn Apply 1 application topically See admin instructions. Apply to the extremities and back daily and as needed for itching /dry skin. Do not apply to buttocks wound.   diphenoxylate-atropine 2.5-0.025 MG tablet Commonly known as:  LOMOTIL Place 2 tablets into feeding tube every 4 (four) hours as needed for diarrhea or loose stools.   ertapenem 1,000 mg in sodium chloride 0.9 % 100 mL Inject 1,000 mg into the vein daily. Last day on 01/16/2019   feeding supplement (PRO-STAT SUGAR FREE 64) Liqd Place 30 mLs into feeding tube daily.   FLUoxetine 20 MG/5ML solution Commonly known as:  PROZAC Place 20 mg into feeding tube daily.   free water Soln Place 200 mLs into feeding tube every 6 (six) hours.   insulin glargine 100 UNIT/ML injection Commonly known as:  LANTUS Inject 0.2 mLs (20 Units total) into the skin daily. Start taking on:  January 10, 2019 What changed:  how much to take   lansoprazole 30 MG disintegrating tablet Commonly known as:  PREVACID SOLUTAB Take 30 mg by mouth 2 (two) times daily. *Administered via gastric tube   LORazepam 0.5 MG tablet Commonly known as:  ATIVAN Take 1 tablet (0.5 mg total) by mouth every 6 (six) hours as needed for anxiety. What changed:  how to take this   Melatonin 1 MG Tabs 2 mg by Feeding Tube route at bedtime.   metoprolol tartrate 25 MG tablet Commonly known as:  LOPRESSOR Take 0.5 tablets (12.5 mg total) by mouth 2 (two) times daily. What changed:    medication strength  how much to take  how to take this   NOVOLOG FLEXPEN 100 UNIT/ML FlexPen Generic drug:  insulin aspart Inject 2-6 Units into the skin See admin instructions. Give per sliding scale every 6 hours; If blood sugars are less than 60, CALL  MD 200-250= 2 units 251-300= 4 units 301-351= 6 units If blood sugars are greater than 351, Give 0 units, CALL MD   PREPARATION H 5-14.4 % Crea Generic drug:  Lidocaine-Glycerin Place 1 application rectally 2 (two) times daily. Preparation H: May also be applied as needed   QUEtiapine 25 MG tablet Commonly known as:  SEROQUEL Take 12.5 mg by mouth 2 (two) times daily.   SANTYL ointment Generic drug:  collagenase Apply 1 application topically See admin instructions. Apply to sacrum wound per tx order.       No Known Allergies  Consultations:  none   Procedures/Studies: Ct Head Wo Contrast  Result Date: 01/04/2019 CLINICAL DATA:  Initial evaluation for acute altered mental status. EXAM: CT HEAD WITHOUT CONTRAST TECHNIQUE: Contiguous axial images were obtained from the base of the skull through the vertex without intravenous contrast. COMPARISON:  Prior CT from 11/17/2018. FINDINGS: Brain: Age-related cerebral atrophy with mild chronic small vessel ischemic disease, stable. There has been interval evolution of previously identified left thalamic hemorrhage, now late subacute in appearance with decreased size and attenuation as from previous. Mild residual subacute blood products measure approximately 2.0 x 1.6 cm. Residual surrounding vasogenic edema within this region with  mild mass effect on the adjacent left lateral ventricle. Trace residual left-to-right midline shift. Previously seen intraventricular hemorrhage has largely resolved. Overall ventricular size mildly increased without frank hydrocephalus. Basilar cisterns remain patent. No other acute intracranial hemorrhage. No acute large vessel territory infarct. No other mass lesion. No extra-axial fluid collection. Vascular: No hyperdense vessel. Scattered vascular calcifications noted within the carotid siphons. Skull: Scalp soft tissues and calvarium within normal limits. Sinuses/Orbits: Globes orbital soft tissues demonstrate no  acute finding. Mild scattered mucosal thickening within the ethmoidal air cells. Visualized paranasal sinuses are otherwise clear. No mastoid effusion. Other: None. IMPRESSION: 1. Normal expected interval evolution of late subacute left thalamic hemorrhage. Associated intraventricular hemorrhage has resolved. Overall ventricular size mildly increased from previous without frank hydrocephalus. 2. No other acute intracranial abnormality. 3. Underlying age-related cerebral atrophy with chronic small vessel ischemic disease. Electronically Signed   By: Jeannine Boga M.D.   On: 01/04/2019 18:16   Ct Abdomen Pelvis W Contrast  Result Date: 01/04/2019 CLINICAL DATA:  Initial evaluation for acute nausea, vomiting, diarrhea. EXAM: CT ABDOMEN AND PELVIS WITH CONTRAST TECHNIQUE: Multidetector CT imaging of the abdomen and pelvis was performed using the standard protocol following bolus administration of intravenous contrast. CONTRAST:  131mL ISOVUE-300 IOPAMIDOL (ISOVUE-300) INJECTION 61% COMPARISON:  Prior CT from 11/14/2014. FINDINGS: Lower chest: Scattered subsegmental atelectatic changes present within the visualized lung bases. Trace left pleural effusion. Visualized lungs are otherwise grossly clear. Hepatobiliary: Irregular somewhat linear hypodensity within the central aspect of the right hepatic lobe noted, indeterminate, but stable from previous, and of doubtful significance given stability. Liver otherwise within normal limits. Gallbladder surgically absent. No biliary dilatation. Pancreas: Pancreas grossly within normal limits. Spleen: Unremarkable. Adrenals/Urinary Tract: Adrenal glands are normal. Kidneys equal in size with symmetric enhancement. No nephrolithiasis, hydronephrosis, or focal enhancing renal mass. No appreciable hydroureter. Bladder decompressed with a Foley catheter in place. Gas lucency within the bladder lumen compatible with catheterization. Stomach/Bowel: Percutaneous gastrostomy  tube in place within the stomach. No adverse features. Focal diverticulum arising from the second portion of the duodenum, grossly similar to previous. No obvious associated inflammation on this motion degraded exam. No evidence for bowel obstruction. Colonic diverticulosis without evidence for acute diverticulitis. Circumferential wall thickening with edema about the rectosigmoid colon, suggesting acute proctitis. Hazy inflammatory stranding within the adjacent perirectal fat. No evidence for perforation or other complication. Vascular/Lymphatic: Normal intravascular enhancement seen throughout the intra-abdominal aorta. Moderate atherosclerotic change. No aneurysm. Mesenteric vessels patent proximally. No adenopathy. Reproductive: Prostate within normal limits. Other: Small fat containing left inguinal hernia noted without associated inflammation. No free air or fluid. Musculoskeletal: No acute osseous abnormality. No discrete lytic or blastic osseous lesions. IMPRESSION: 1. Circumferential wall thickening and edema about the rectosigmoid colon, suggesting acute proctitis. No complication. 2. Colonic diverticulosis without evidence for acute diverticulitis. 3. Percutaneous gastrostomy tube in place without adverse features. 4. Trace left pleural effusion. Aortic Atherosclerosis (ICD10-I70.0). Electronically Signed   By: Jeannine Boga M.D.   On: 01/04/2019 18:27        Discharge Exam: Vitals:   01/08/19 2146 01/09/19 0611  BP: 122/71 130/82  Pulse: 73 84  Resp:    Temp: 98.1 F (36.7 C) 98 F (36.7 C)  SpO2: 92% 96%   Vitals:   01/08/19 1434 01/08/19 2029 01/08/19 2146 01/09/19 0611  BP: (!) 150/77  122/71 130/82  Pulse: 69  73 84  Resp: 18     Temp: (!) 97.5 F (36.4 C)  98.1 F (  36.7 C) 98 F (36.7 C)  TempSrc: Oral  Oral Oral  SpO2: 98% 98% 92% 96%  Weight:      Height:        General: Pt is alert, awake, not in acute distress Cardiovascular: RRR, S1/S2 +, no rubs, no  gallops Respiratory: CTA bilaterally, no wheezing, no rhonchi Abdominal: Soft, NT, ND, bowel sounds + Extremities: no edema, no cyanosis   The results of significant diagnostics from this hospitalization (including imaging, microbiology, ancillary and laboratory) are listed below for reference.    Significant Diagnostic Studies: Ct Head Wo Contrast  Result Date: 01/04/2019 CLINICAL DATA:  Initial evaluation for acute altered mental status. EXAM: CT HEAD WITHOUT CONTRAST TECHNIQUE: Contiguous axial images were obtained from the base of the skull through the vertex without intravenous contrast. COMPARISON:  Prior CT from 11/17/2018. FINDINGS: Brain: Age-related cerebral atrophy with mild chronic small vessel ischemic disease, stable. There has been interval evolution of previously identified left thalamic hemorrhage, now late subacute in appearance with decreased size and attenuation as from previous. Mild residual subacute blood products measure approximately 2.0 x 1.6 cm. Residual surrounding vasogenic edema within this region with mild mass effect on the adjacent left lateral ventricle. Trace residual left-to-right midline shift. Previously seen intraventricular hemorrhage has largely resolved. Overall ventricular size mildly increased without frank hydrocephalus. Basilar cisterns remain patent. No other acute intracranial hemorrhage. No acute large vessel territory infarct. No other mass lesion. No extra-axial fluid collection. Vascular: No hyperdense vessel. Scattered vascular calcifications noted within the carotid siphons. Skull: Scalp soft tissues and calvarium within normal limits. Sinuses/Orbits: Globes orbital soft tissues demonstrate no acute finding. Mild scattered mucosal thickening within the ethmoidal air cells. Visualized paranasal sinuses are otherwise clear. No mastoid effusion. Other: None. IMPRESSION: 1. Normal expected interval evolution of late subacute left thalamic hemorrhage.  Associated intraventricular hemorrhage has resolved. Overall ventricular size mildly increased from previous without frank hydrocephalus. 2. No other acute intracranial abnormality. 3. Underlying age-related cerebral atrophy with chronic small vessel ischemic disease. Electronically Signed   By: Jeannine Boga M.D.   On: 01/04/2019 18:16   Ct Abdomen Pelvis W Contrast  Result Date: 01/04/2019 CLINICAL DATA:  Initial evaluation for acute nausea, vomiting, diarrhea. EXAM: CT ABDOMEN AND PELVIS WITH CONTRAST TECHNIQUE: Multidetector CT imaging of the abdomen and pelvis was performed using the standard protocol following bolus administration of intravenous contrast. CONTRAST:  195mL ISOVUE-300 IOPAMIDOL (ISOVUE-300) INJECTION 61% COMPARISON:  Prior CT from 11/14/2014. FINDINGS: Lower chest: Scattered subsegmental atelectatic changes present within the visualized lung bases. Trace left pleural effusion. Visualized lungs are otherwise grossly clear. Hepatobiliary: Irregular somewhat linear hypodensity within the central aspect of the right hepatic lobe noted, indeterminate, but stable from previous, and of doubtful significance given stability. Liver otherwise within normal limits. Gallbladder surgically absent. No biliary dilatation. Pancreas: Pancreas grossly within normal limits. Spleen: Unremarkable. Adrenals/Urinary Tract: Adrenal glands are normal. Kidneys equal in size with symmetric enhancement. No nephrolithiasis, hydronephrosis, or focal enhancing renal mass. No appreciable hydroureter. Bladder decompressed with a Foley catheter in place. Gas lucency within the bladder lumen compatible with catheterization. Stomach/Bowel: Percutaneous gastrostomy tube in place within the stomach. No adverse features. Focal diverticulum arising from the second portion of the duodenum, grossly similar to previous. No obvious associated inflammation on this motion degraded exam. No evidence for bowel obstruction. Colonic  diverticulosis without evidence for acute diverticulitis. Circumferential wall thickening with edema about the rectosigmoid colon, suggesting acute proctitis. Hazy inflammatory stranding within the adjacent perirectal  fat. No evidence for perforation or other complication. Vascular/Lymphatic: Normal intravascular enhancement seen throughout the intra-abdominal aorta. Moderate atherosclerotic change. No aneurysm. Mesenteric vessels patent proximally. No adenopathy. Reproductive: Prostate within normal limits. Other: Small fat containing left inguinal hernia noted without associated inflammation. No free air or fluid. Musculoskeletal: No acute osseous abnormality. No discrete lytic or blastic osseous lesions. IMPRESSION: 1. Circumferential wall thickening and edema about the rectosigmoid colon, suggesting acute proctitis. No complication. 2. Colonic diverticulosis without evidence for acute diverticulitis. 3. Percutaneous gastrostomy tube in place without adverse features. 4. Trace left pleural effusion. Aortic Atherosclerosis (ICD10-I70.0). Electronically Signed   By: Jeannine Boga M.D.   On: 01/04/2019 18:27     Microbiology: Recent Results (from the past 240 hour(s))  Urine culture     Status: Abnormal   Collection Time: 01/04/19  6:53 PM  Result Value Ref Range Status   Specimen Description   Final    URINE, CLEAN CATCH Performed at [facility], [address] [place], [place] [address]    Special Requests   Final    NONE Performed at [facility], [address] [place], [place] [address]    Culture (A)  Final    >=100,000 COLONIES/mL KLEBSIELLA OXYTOCA Confirmed Extended Spectrum Beta-Lactamase Producer (ESBL).  In bloodstream infections from ESBL organisms, carbapenems are preferred over piperacillin/tazobactam. They are shown to have a lower risk of mortality.    Report Status 01/07/2019 FINAL  Final   Organism ID, Bacteria KLEBSIELLA OXYTOCA (A)  Final      Susceptibility    Klebsiella oxytoca - MIC*    AMPICILLIN >=32 RESISTANT Resistant     CEFAZOLIN >=64 RESISTANT Resistant     CEFTRIAXONE >=64 RESISTANT Resistant     CIPROFLOXACIN 2 INTERMEDIATE Intermediate     GENTAMICIN >=16 RESISTANT Resistant     IMIPENEM <=0.25 SENSITIVE Sensitive     NITROFURANTOIN 128 RESISTANT Resistant     TRIMETH/SULFA >=320 RESISTANT Resistant     AMPICILLIN/SULBACTAM >=32 RESISTANT Resistant     PIP/TAZO >=128 RESISTANT Resistant     Extended ESBL POSITIVE Resistant     * >=100,000 COLONIES/mL KLEBSIELLA OXYTOCA  MRSA PCR Screening     Status: Abnormal   Collection Time: 01/05/19 12:30 PM  Result Value Ref Range Status   MRSA by PCR POSITIVE (A) NEGATIVE Final    Comment:        The GeneXpert MRSA Assay (FDA approved for NASAL specimens only), is one component of a comprehensive MRSA colonization surveillance program. It is not intended to diagnose MRSA infection nor to guide or monitor treatment for MRSA infections. RESULT CALLED TO, READ BACK BY AND VERIFIED WITH: JACKSON,[email or web address]  BY MATTHEWS,B 1.25.2020 Performed at [facility], [address], [place], [place] [address]   C difficile quick scan w PCR reflex     Status: None   Collection Time: 01/05/19 12:50 PM  Result Value Ref Range Status   C Diff antigen NEGATIVE NEGATIVE Final   C Diff toxin NEGATIVE NEGATIVE Final   C Diff interpretation No C. difficile detected.  Final    Comment: Performed at [facility], [address], [place], [place] [address]  Gastrointestinal Panel by PCR , Stool     Status: None   Collection Time: 01/05/19 12:50 PM  Result Value Ref Range Status   Campylobacter species NOT DETECTED NOT DETECTED Final   Plesimonas shigelloides NOT DETECTED NOT DETECTED Final   Salmonella species NOT DETECTED NOT DETECTED Final   Yersinia enterocolitica NOT  DETECTED NOT DETECTED Final   Vibrio species NOT DETECTED NOT DETECTED Final   Vibrio cholerae NOT DETECTED NOT DETECTED Final    Enteroaggregative E coli (EAEC) NOT DETECTED NOT DETECTED Final   Enteropathogenic E coli (EPEC) NOT DETECTED NOT DETECTED Final   Enterotoxigenic E coli (ETEC) NOT DETECTED NOT DETECTED Final   Shiga like toxin producing E coli (STEC) NOT DETECTED NOT DETECTED Final   Shigella/Enteroinvasive E coli (EIEC) NOT DETECTED NOT DETECTED Final   Cryptosporidium NOT DETECTED NOT DETECTED Final   Cyclospora cayetanensis NOT DETECTED NOT DETECTED Final   Entamoeba histolytica NOT DETECTED NOT DETECTED Final   Giardia lamblia NOT DETECTED NOT DETECTED Final   Adenovirus F40/41 NOT DETECTED NOT DETECTED Final   Astrovirus NOT DETECTED NOT DETECTED Final   Norovirus GI/GII NOT DETECTED NOT DETECTED Final   Rotavirus A NOT DETECTED NOT DETECTED Final   Sapovirus (I, II, IV, and V) NOT DETECTED NOT DETECTED Final    Comment: Performed at [facility], [address] [place], [place] [address]     Labs: Basic Metabolic Panel: Recent Labs  Lab 01/04/19 1445 01/05/19 0715 01/06/19 0620 01/07/19 0420 01/08/19 0859  NA 139 141 147* 145 138  K 4.1 3.5 3.5 3.6 3.7  CL 104 108 112* 113* 104  CO2 28 26 28 27 29   GLUCOSE 207* 116* 99 95 211*  BUN 42* 44* 40* 29* 18  CREATININE 1.15 1.22 1.39* 1.20 0.90  CALCIUM 8.6* 8.1* 8.1* 7.8* 7.9*   Liver Function Tests: Recent Labs  Lab 01/04/19 1445  AST 33  ALT 47*  ALKPHOS 132*  BILITOT 0.8  PROT 6.6  ALBUMIN 2.7*   Recent Labs  Lab 01/04/19 1445  LIPASE 43   No results for input(s): AMMONIA in the last 168 hours. CBC: Recent Labs  Lab 01/04/19 1445 01/05/19 0715 01/06/19 0620  WBC 12.6* 6.6 6.8  NEUTROABS  --  5.1  --   HGB 14.4 13.8 13.6  HCT 45.5 44.3 43.8  MCV 94.0 95.7 98.4  PLT 334 291 309   Cardiac Enzymes: No results for input(s): CKTOTAL, CKMB, CKMBINDEX, TROPONINI in the last 168 hours. BNP: Invalid input(s): POCBNP CBG: Recent Labs  Lab 01/08/19 0719 01/08/19 1121 01/08/19 1617 01/08/19 2147  01/09/19 0732  GLUCAP 188* 226* 126* 71 178*    Time coordinating discharge:  36 minutes  Signed:  Orson Eva, DO [facility] Pager: [phone] 01/09/2019, 11:35 AM

## 2019-01-09 NOTE — Clinical Social Work Note (Signed)
Discharge clinicals sent to facility.  Mrs. Drumgoole notified of discharge.   LCSW signing off.     Laporshia Hogen, Clydene Pugh, LCSW

## 2019-01-09 NOTE — Progress Notes (Signed)
Pt to discharge to [facility].  Report called to [facility] at [phone].  [id number]

## 2019-01-09 NOTE — Progress Notes (Signed)
Peripherally Inserted Central Catheter/Midline Placement  The IV Nurse has discussed with the patient and/or persons authorized to consent for the patient, the purpose of this procedure and the potential benefits and risks involved with this procedure.  The benefits include less needle sticks, lab draws from the catheter, and the patient may be discharged home with the catheter. Risks include, but not limited to, infection, bleeding, blood clot (thrombus formation), and puncture of an artery; nerve damage and irregular heartbeat and possibility to perform a PICC exchange if needed/ordered by physician.  Alternatives to this procedure were also discussed.  Bard Power PICC patient education guide, fact sheet on infection prevention and patient information card has been provided to patient /or left at bedside.  Consent obtained from wife due to altered mental status.  PICC/Midline Placement Documentation  PICC Single Lumen 01/09/19 PICC Left Brachial 44 cm 0 cm (Active)  Indication for Insertion or Continuance of Line Home intravenous therapies (PICC only) 01/09/2019  1:54 PM  Exposed Catheter (cm) 0 cm 01/09/2019  1:54 PM  Site Assessment Clean;Dry;Intact 01/09/2019  1:54 PM  Line Status Flushed;Saline locked;Blood return noted 01/09/2019  1:54 PM  Dressing Type Transparent 01/09/2019  1:54 PM  Dressing Status Clean;Dry;Intact 01/09/2019  1:54 PM  Dressing Intervention New dressing 01/09/2019  1:54 PM  Dressing Change Due 01/16/19 01/09/2019  1:54 PM       Deanie Jupiter, Nicolette Bang 01/09/2019, 1:55 PM

## 2019-01-10 ENCOUNTER — Other Ambulatory Visit: Payer: Self-pay

## 2019-01-10 MED ORDER — LORAZEPAM 0.5 MG PO TABS: ORAL_TABLET | ORAL | 0 refills | Status: DC

## 2019-01-11 ENCOUNTER — Encounter: Payer: Self-pay | Admitting: Internal Medicine

## 2019-01-11 ENCOUNTER — Non-Acute Institutional Stay (SKILLED_NURSING_FACILITY): Payer: Medicare Other | Admitting: Internal Medicine

## 2019-01-11 DIAGNOSIS — I10 Essential (primary) hypertension: Secondary | ICD-10-CM | POA: Diagnosis not present

## 2019-01-11 DIAGNOSIS — K6289 Other specified diseases of anus and rectum: Secondary | ICD-10-CM

## 2019-01-11 DIAGNOSIS — N390 Urinary tract infection, site not specified: Secondary | ICD-10-CM | POA: Diagnosis not present

## 2019-01-11 DIAGNOSIS — I4891 Unspecified atrial fibrillation: Secondary | ICD-10-CM

## 2019-01-11 DIAGNOSIS — I6930 Unspecified sequelae of cerebral infarction: Secondary | ICD-10-CM

## 2019-01-11 NOTE — Progress Notes (Signed)
Location:    [place] Room Number: 130/P Place of Service:  SNF (31) Provider:  [name], MD  Patient Care Team: [name], MD as PCP - General (Family Medicine) [name], MD as Consulting Physician (Urology)  Extended Emergency Contact Information Primary Emergency Contact: [name],[name] Address: [address]          [place] [address] [name] of [place] Phone: [phone] Mobile Phone: [phone] Relation: Spouse Secondary Emergency Contact: [name] States of [place] Phone: [phone] Relation: Son  Code Status:  Full Code Goals of care: Advanced Directive information Advanced Directives 01/11/2019  Does Patient Have a Medical Advance Directive? Yes  Type of Advance Directive (No Data)  Does patient want to make changes to medical advance directive? No - Patient declined  Pre-existing out of facility DNR order (yellow form or pink MOST form) -     Chief Complaint  Patient presents with  . Hospitalization Follow-up    Hospitalization F/U Visit  Status post admission for nausea vomiting with UTI complicated with recent CVA  HPI:  Pt is a 73 y.o. male seen today for a hospital follow-up for nausea vomiting change in status with diagnosis of UTI.  Patient has a history of hypertension insulin-dependent diabetes coronary artery disease and hemorrhagic CVA in December 2019.  On most recent admission he presented with nausea and vomiting change in status with more restlessness and agitation.  Neuro somewhat hypotensive at 90/57- CT of the head showed evolution of a late subacute left thalamic hemorrhage as expected.  CT of the abdomen and pelvis did show findings concerning for proctitis.  He also had mild leukocytosis at 12,600.  He was given IV fluids Rocephin and Cipro and Flagyl.  He was treated with Rocephin and Flagyl once he was admitted to the hospital.  Urine  culture showed ESBL Klebsiella and antibiotics were switched to meropenem--- Clinically he improved with no more vomiting and improvement in his diarrhea- he tolerated his tube feedings- he will be finishing 7 additional days of ertapenem to complete 10 days.     Past Medical History:  Diagnosis Date  . A-fib ([place])    a. Post-op afib after CABG 08/2012.  [name] CAD (coronary artery disease)    a. NSTEMI s/p stent to distal RCA 03/2000. b. Inf MI s/p emergent thrombectomy/stenting mid RCA 10/2000. c. NSTEMI s/p CABGx4 (LIMA-LAD, SVG-OM2, seq SVG-acute marginal and PD) 05/13/59 - course complicated by confusion, post-op AF, L pleural effusion with thoracentesis. d. Normal LV function by echo 08/2012.  . Diabetes mellitus   . GERD (gastroesophageal reflux disease)   . Hiatal hernia   . HLD (hyperlipidemia)   . HTN (hypertension)   . Peripheral vascular disease ([place])    a. Carotid dopplers neg 08/13/12. b. Pre-cabg ABIs - R=0.87 suggesting mild dz, L=1.29 possibly falsely elevated due to calcified vessels.  . Pleural effusion    a. L pleural eff after CABG s/p thoracentesis 08/22/12.  . Prostate cancer [name])    Status post radiation treatment.  . Prostatic hypertrophy    a. Hx of urinary retention, awaiting TURP.  . Tobacco abuse   . Valvular heart disease    a. Mild  MR by TEE 08/2012.   Past Surgical History:  Procedure Laterality Date  . APPENDECTOMY    . BACK SURGERY    . CORONARY ARTERY BYPASS GRAFT  08/16/2012   Procedure: CORONARY ARTERY BYPASS GRAFTING (CABG);  Surgeon: Melrose Nakayama, MD;  Location: [place];  Service: Open Heart Surgery;  Laterality: N/A;  . LEFT HEART CATHETERIZATION WITH CORONARY ANGIOGRAM N/A 08/14/2012   Procedure: LEFT HEART CATHETERIZATION WITH CORONARY ANGIOGRAM;  Surgeon: Peter M Martinique, MD;  Location: [facility] CATH LAB;  Service: Cardiovascular;  Laterality: N/A;    No Known Allergies  Outpatient Encounter Medications as of 01/11/2019  Medication Sig  . amLODipine  (NORVASC) 5 MG tablet Take 1 tablet (5 mg total) by mouth daily.  [name] atorvastatin (LIPITOR) 80 MG tablet Place 80 mg into feeding tube at bedtime.  . collagenase (SANTYL) ointment Apply 1 application topically See admin instructions. Apply to sacrum wound per tx order.   . Dimethicone 1.3 % LOTN Apply 1 application topically See admin instructions. Apply to the extremities and back daily and as needed for itching /dry skin. Do not apply to buttocks wound.  . diphenoxylate-atropine (LOMOTIL) 2.5-0.025 MG tablet Place 2 tablets into feeding tube every 4 (four) hours as needed for diarrhea or loose stools.  . ertapenem 1,000 mg in sodium chloride 0.9 % 100 mL Inject 1,000 mg into the vein daily. Last day on 01/16/2019  . FLUoxetine (PROZAC) 20 MG/5ML solution Place 20 mg into feeding tube daily.   . insulin aspart (NOVOLOG FLEXPEN) 100 UNIT/ML FlexPen Inject 2-6 Units into the skin See admin instructions. Give per sliding scale every 6 hours; If blood sugars are less than 60, CALL MD 200-250= 2 units 251-300= 4 units 301-351= 6 units If blood sugars are greater than 351, Give 0 units, CALL MD  . insulin glargine (LANTUS) 100 UNIT/ML injection Inject 0.2 mLs (20 Units total) into the skin daily.  . lansoprazole (PREVACID SOLUTAB) 30 MG disintegrating tablet Take 30 mg by mouth 2 (two) times daily. *Administered via gastric tube  . Lidocaine-Glycerin (PREPARATION H) 5-14.4 % CREA Place 1 application rectally 2 (two) times daily. Preparation H: May also be applied as needed  . LORazepam (ATIVAN) 0.5 MG tablet Take one tablet po Q six hours prn anxiety x 14 days  . Melatonin 1 MG TABS 2 mg by Feeding Tube route at bedtime.  . metoprolol tartrate (LOPRESSOR) 25 MG tablet Take 0.5 tablets (12.5 mg total) by mouth 2 (two) times daily.  . QUEtiapine (SEROQUEL) 25 MG tablet Take 12.5 mg by mouth 2 (two) times daily.  . Water For Irrigation, Sterile (FREE WATER) SOLN Place 200 mLs into feeding tube every 6  (six) hours.  . [DISCONTINUED] Amino Acids-Protein Hydrolys (FEEDING SUPPLEMENT, PRO-STAT SUGAR FREE 64,) LIQD Place 30 mLs into feeding tube daily.   No facility-administered encounter medications on file as of 01/11/2019.      Review of Systems   Unobtainable secondary patient's a phasic status status post CVA   There is no immunization history on file for this patient. Pertinent  Health Maintenance Due  Topic Date Due  . INFLUENZA VACCINE  01/24/2019 (Originally 07/12/2018)  . FOOT EXAM  01/24/2019 (Originally 06/14/1956)  . HEMOGLOBIN A1C  01/24/2019 (Originally 02/10/2013)  . OPHTHALMOLOGY EXAM  01/24/2019 (Originally 06/14/1956)  . URINE MICROALBUMIN  01/24/2019 (Originally 06/14/1956)  . COLONOSCOPY  01/24/2019 (Originally 06/14/1994)  . PNA vac Low Risk Adult (1 of 2 - PCV13) 01/24/2019 (Originally 06/14/2009)   No flowsheet data found. Functional Status Survey:    Vitals:   01/11/19 1613  BP: (!) 147/83  Pulse: 84  Resp: 20  Temp: (!) 97.2 F (36.2 C)  TempSrc: Oral  SpO2: 95%  Physical Exam   In general this is a well-nourished elderly male he appears somewhat restless but less so than what I have seen previously.  His skin is warm and dry.--He does have a stage III sacral pressure ulcer followed by wound care currently covered  Eyes sclera and conjunctive are clear pupils appear to be reactive to light.  Oropharynx is clear mucous membranes moist.  Chest is clear to auscultation with poor respiratory effort he does not follow verbal commands.  Heart is regular rate and rhythm without murmur gallop or rub he does not have significant lower extremity edema.  Abdomen soft nontender obese with positive bowel sounds he does have a PEG tube in place which appears unremarkable.   GU has an indwelling Foley catheter and appears bag has just been recently emptied   Musculoskeletal continues with right-sided hemiparesis does have some movement of his lower extremity but  this appears to be involuntary strength appears relatively preserved left upper and lower extremities he is aphasic not following any commands or making sensible words  Neurologic as noted above.  Psych cannot assess secondary to his mental status  Labs reviewed: Recent Labs    01/06/19 0620 01/07/19 0420 01/08/19 0859  NA 147* 145 138  K 3.5 3.6 3.7  CL 112* 113* 104  CO2 28 27 29   GLUCOSE 99 95 211*  BUN 40* 29* 18  CREATININE 1.39* 1.20 0.90  CALCIUM 8.1* 7.8* 7.9*   Recent Labs    01/04/19 1445  AST 33  ALT 47*  ALKPHOS 132*  BILITOT 0.8  PROT 6.6  ALBUMIN 2.7*   Recent Labs    12/24/18 0850  01/04/19 1445 01/05/19 0715 01/06/19 0620  WBC 7.4   < > 12.6* 6.6 6.8  NEUTROABS 5.0  --   --  5.1  --   HGB 13.7   < > 14.4 13.8 13.6  HCT 43.7   < > 45.5 44.3 43.8  MCV 94.6   < > 94.0 95.7 98.4  PLT 271   < > 334 291 309   < > = values in this interval not displayed.   No results found for: TSH Lab Results  Component Value Date   HGBA1C 7.6 (H) 08/13/2012   Lab Results  Component Value Date   CHOL 141 08/14/2012   HDL 33 (L) 08/14/2012   LDLCALC 78 08/14/2012   TRIG 151 (H) 08/14/2012   CHOLHDL 4.3 08/14/2012    Significant Diagnostic Results in last 30 days:  No results found.  Assessment/Plan  #1 history of nausea and vomiting with UTI- positive for Klebsiella he is finishing a course of Ertapenum--status apparently has improved he does not appear to be in any distress and is afebrile white count did normalize.  2.  History of hemorrhagic CVA December 2019 he is status post PEG tube- at this point continue supportive care.  3.  History of coronary artery disease he is not on aspirin with history of hemorrhagic CVA he is on a statin.  4.  History of type 2 diabetes he is on Lantus and NovoLog- at this point will monitor CBGs and appears recent blood sugars have been mainly in the 200s but this will have to be watched continue to be elevated consider  increasing Lantus-again would like to get more readings.  5.  History of hypertension he continues on Norvasc as well as low-dose Lopressor- systolic today is in the 409W again would like to get more readings before making  medication changes  #6 chronic kidney disease this appears relatively baseline with a creatinine of 0.9 on discharge- update labs are pending for next week.  7.  History of agitation with anxiety he continues on PRN Ativan as well as Seroquel.  #8 sacral ulcer this will be followed by wound care  At this point continue to monitor he appears to be at baseline diarrhea apparently has improved but this will have to be watched he is finishing course of antibiotic for UTI-at this point continue to monitor blood sugars and blood pressure would like to get more readings before making changes  [id number]-of note greater than 35 minutes spent assessing patient reviewing his chart and labs and coordinating and formulating a plan of care for numerous diagnoses- of note greater than 50% of time spent coordinating a plan of care with input as noted above

## 2019-01-12 ENCOUNTER — Encounter: Payer: Self-pay | Admitting: Internal Medicine

## 2019-01-14 ENCOUNTER — Non-Acute Institutional Stay (SKILLED_NURSING_FACILITY): Payer: Medicare Other | Admitting: Internal Medicine

## 2019-01-14 ENCOUNTER — Encounter: Payer: Self-pay | Admitting: Internal Medicine

## 2019-01-14 DIAGNOSIS — E119 Type 2 diabetes mellitus without complications: Secondary | ICD-10-CM

## 2019-01-14 DIAGNOSIS — I615 Nontraumatic intracerebral hemorrhage, intraventricular: Secondary | ICD-10-CM

## 2019-01-14 DIAGNOSIS — R197 Diarrhea, unspecified: Secondary | ICD-10-CM

## 2019-01-14 DIAGNOSIS — I610 Nontraumatic intracerebral hemorrhage in hemisphere, subcortical: Secondary | ICD-10-CM

## 2019-01-14 DIAGNOSIS — K6289 Other specified diseases of anus and rectum: Secondary | ICD-10-CM | POA: Diagnosis not present

## 2019-01-14 DIAGNOSIS — L89153 Pressure ulcer of sacral region, stage 3: Secondary | ICD-10-CM

## 2019-01-14 DIAGNOSIS — IMO0001 Reserved for inherently not codable concepts without codable children: Secondary | ICD-10-CM

## 2019-01-14 DIAGNOSIS — Z794 Long term (current) use of insulin: Secondary | ICD-10-CM

## 2019-01-14 NOTE — Progress Notes (Signed)
Provider:  Veleta Miners MD Location:    [place] Room Number: 130/P Place of Service:  SNF (31)  PCP: [name], MD Patient Care Team: [name], MD as PCP - General (Family Medicine) [name], MD as Consulting Physician (Urology)  Extended Emergency Contact Information Primary Emergency Contact: Yazzie,[name] Address: [address]          [place] [address] [name] of [place] Phone: [phone] Mobile Phone: [phone] Relation: Spouse Secondary Emergency Contact: [name] [place] of [place] Phone: [phone] Relation: Son  Code Status: Full Code Goals of Care: Advanced Directive information Advanced Directives 01/14/2019  Does Patient Have a Medical Advance Directive? Yes  Type of Advance Directive (No Data)  Does patient want to make changes to medical advance directive? No - Patient declined  Pre-existing out of facility DNR order (yellow form or pink MOST form) -      Chief Complaint  Patient presents with  . Readmit To SNF    Readmission Visit    HPI: Patient is a 73 y.o. male seen today for Readmission to SNF for Therapy and Long term Care Patient Was admitted to the Hospital due to UTI and Diarrhea with Proctitis.  Patient has been in SNF for therapy after [facility] in [facility] from 12/08-01/10 with Acute nontraumatic subcortical hemorrhage of left hemisphere. His stay was complicated by Aspiration Pneumonia requiring PEG tube placement, Urinary Retention with Foley catheter, Sacral Pressure wound Gastric ulcer and  Restlessness. Patient also has h/o Diabetes Mellitus, Hypertension and hyperlipidemia In the facility patient developed Diarrhea. His Metformin was stopped and Stool was negative for any pathogen or C Diff. But then he also developed Vomiting. He was send to the ED. His CT Abdomen showed Proctitis.And his urine for Positive for ESBL positive for Klebsiella UTI. He was  treated with IV Ertapenem.and is discharged on it  He is back in the facility. D/W the Nurses and His wife . He  Is making slow progress with Therapy.  Following Some simple  Commands. He continues to be mostly aphasic.  Continues to be completely dependent for his ADLs.    Past Medical History:  Diagnosis Date  . A-fib ([place])    a. Post-op afib after CABG 08/2012.  [name] CAD (coronary artery disease)    a. NSTEMI s/p stent to distal RCA 03/2000. b. Inf MI s/p emergent thrombectomy/stenting mid RCA 10/2000. c. NSTEMI s/p CABGx4 (LIMA-LAD, SVG-OM2, seq SVG-acute marginal and PD) 0/3/54 - course complicated by confusion, post-op AF, L pleural effusion with thoracentesis. d. Normal LV function by echo 08/2012.  . Diabetes mellitus   . GERD (gastroesophageal reflux disease)   . Hiatal hernia   . HLD (hyperlipidemia)   . HTN (hypertension)   . Peripheral vascular disease ([place])    a. Carotid dopplers neg 08/13/12. b. Pre-cabg ABIs - R=0.87 suggesting mild dz, L=1.29 possibly falsely elevated due to calcified vessels.  . Pleural effusion    a. L pleural eff after CABG s/p thoracentesis 08/22/12.  . Prostate cancer [facility])    Status post radiation treatment.  . Prostatic hypertrophy    a. Hx of urinary retention, awaiting TURP.  . Tobacco abuse   . Valvular heart disease    a. Mild  MR by TEE 08/2012.   Past Surgical History:  Procedure Laterality Date  . APPENDECTOMY    . BACK SURGERY    . CORONARY ARTERY BYPASS GRAFT  08/16/2012   Procedure:  CORONARY ARTERY BYPASS GRAFTING (CABG);  Surgeon: Melrose Nakayama, MD;  Location: [place];  Service: Open Heart Surgery;  Laterality: N/A;  . LEFT HEART CATHETERIZATION WITH CORONARY ANGIOGRAM N/A 08/14/2012   Procedure: LEFT HEART CATHETERIZATION WITH CORONARY ANGIOGRAM;  Surgeon: Peter M Martinique, MD;  Location: [facility] CATH LAB;  Service: Cardiovascular;  Laterality: N/A;    reports that he quit smoking about 46 years ago. He has never used smokeless tobacco. He  reports that he does not drink alcohol or use drugs. Social History   Socioeconomic History  . Marital status: Married    Spouse name: Not on file  . Number of children: Not on file  . Years of education: Not on file  . Highest education level: Not on file  Occupational History  . Not on file  Social Needs  . Financial resource strain: Not on file  . Food insecurity:    Worry: Not on file    Inability: Not on file  . Transportation needs:    Medical: Not on file    Non-medical: Not on file  Tobacco Use  . Smoking status: Former Smoker    Last attempt to quit: 11/12/1972    Years since quitting: 46.2  . Smokeless tobacco: Never Used  Substance and Sexual Activity  . Alcohol use: No  . Drug use: No  . Sexual activity: Never  Lifestyle  . Physical activity:    Days per week: Not on file    Minutes per session: Not on file  . Stress: Not on file  Relationships  . Social connections:    Talks on phone: Not on file    Gets together: Not on file    Attends religious service: Not on file    Active member of club or organization: Not on file    Attends meetings of clubs or organizations: Not on file    Relationship status: Not on file  . Intimate partner violence:    Fear of current or ex partner: Not on file    Emotionally abused: Not on file    Physically abused: Not on file    Forced sexual activity: Not on file  Other Topics Concern  . Not on file  Social History Narrative  . Not on file    Functional Status Survey:    History reviewed. No pertinent family history.  Health Maintenance  Topic Date Due  . INFLUENZA VACCINE  01/24/2019 (Originally 07/12/2018)  . FOOT EXAM  01/24/2019 (Originally 06/14/1956)  . HEMOGLOBIN A1C  01/24/2019 (Originally 02/10/2013)  . OPHTHALMOLOGY EXAM  01/24/2019 (Originally 06/14/1956)  . URINE MICROALBUMIN  01/24/2019 (Originally 06/14/1956)  . COLONOSCOPY  01/24/2019 (Originally 06/14/1994)  . TETANUS/TDAP  01/24/2019 (Originally 06[birth year]15/1963)   . Hepatitis C Screening  01/24/2019 (Originally 04-Aug-1946)  . PNA vac Low Risk Adult (1 of 2 - PCV13) 01/24/2019 (Originally 06/14/2009)    No Known Allergies  Outpatient Encounter Medications as of 01/14/2019  Medication Sig  . amLODipine (NORVASC) 5 MG tablet Place 5 mg into feeding tube daily.  [name] atorvastatin (LIPITOR) 80 MG tablet Place 80 mg into feeding tube at bedtime.  . collagenase (SANTYL) ointment Apply 1 application topically See admin instructions. Apply to sacrum wound per tx order.   . Dimethicone 1.3 % LOTN Apply 1 application topically See admin instructions. Apply to the extremities and back daily and as needed for itching /dry skin. Do not apply to buttocks wound.  . diphenoxylate-atropine (LOMOTIL) 2.5-0.025 MG tablet Place 2 tablets  into feeding tube every 4 (four) hours as needed for diarrhea or loose stools.  . ertapenem 1,000 mg in sodium chloride 0.9 % 100 mL Inject 1,000 mg into the vein daily. Last day on 01/16/2019  . FLUoxetine (PROZAC) 20 MG/5ML solution Place 20 mg into feeding tube daily.   . insulin aspart (NOVOLOG FLEXPEN) 100 UNIT/ML FlexPen Inject 2-6 Units into the skin See admin instructions. Give per sliding scale every 6 hours; If blood sugars are less than 60, CALL MD 200-250= 2 units 251-300= 4 units 301-351= 6 units If blood sugars are greater than 351, Give 0 units, CALL MD  . Insulin Glargine (LANTUS SOLOSTAR) 100 UNIT/ML Solostar Pen Inject 25 Units into the skin daily.  . lansoprazole (PREVACID SOLUTAB) 30 MG disintegrating tablet Take 30 mg by mouth 2 (two) times daily. *Administered via gastric tube  . Lidocaine-Glycerin (PREPARATION H) 5-14.4 % CREA Place 1 application rectally 2 (two) times daily. Preparation H: May also be applied as needed  . LORazepam (ATIVAN) 0.5 MG tablet Take one tablet po Q six hours prn anxiety x 14 days  . Melatonin 1 MG TABS 2 mg by Feeding Tube route at bedtime.  . metoprolol tartrate (LOPRESSOR) 25 MG tablet Take  0.5 tablets (12.5 mg total) by mouth 2 (two) times daily.  . QUEtiapine (SEROQUEL) 25 MG tablet Take 12.5 mg by mouth 2 (two) times daily.  . Water For Irrigation, Sterile (FREE WATER) SOLN Place 200 mLs into feeding tube every 6 (six) hours.  . [DISCONTINUED] amLODipine (NORVASC) 5 MG tablet Take 1 tablet (5 mg total) by mouth daily. (Patient taking differently: Place 5 mg into feeding tube daily. )  . [DISCONTINUED] insulin glargine (LANTUS) 100 UNIT/ML injection Inject 0.2 mLs (20 Units total) into the skin daily. (Patient taking differently: Inject 25 Units into the skin daily. )   No facility-administered encounter medications on file as of 01/14/2019.      Review of Systems  Unable to perform ROS: Other    Vitals:   01/14/19 1058  BP: (!) 148/84  Pulse: 79  Resp: 20  Temp: 98 F (36.7 C)  TempSrc: Oral   There is no height or weight on file to calculate BMI. Physical Exam Vitals signs reviewed.  Constitutional:      Appearance: Normal appearance.  HENT:     Head: Normocephalic.     Nose: Nose normal.     Mouth/Throat:     Comments: Mild Thrush Eyes:     Pupils: Pupils are equal, round, and reactive to light.  Neck:     Musculoskeletal: Neck supple.  Cardiovascular:     Rate and Rhythm: Normal rate and regular rhythm.     Pulses: Normal pulses.     Heart sounds: Normal heart sounds.  Pulmonary:     Effort: Pulmonary effort is normal. No respiratory distress.     Breath sounds: Normal breath sounds. No wheezing or rales.  Abdominal:     General: Abdomen is flat. Bowel sounds are normal. There is no distension.     Palpations: Abdomen is soft.     Tenderness: There is no abdominal tenderness. There is no guarding.  Musculoskeletal:        General: No swelling.  Skin:    General: Skin is warm.     Comments: Has Stage 3 Sacral pressure Ulcer but much Clearer [place] is not there anymore.  Neurological:     Mental Status: He is alert.     Comments:  Patient has  Right UE hemiparesis. Does move his LE involuntarily. Good Strength in Left Upper and Lower extremities. Aphasic. Does follow some simple commands. No sensible words.  Psychiatric:     Comments: Cannot assess     Labs reviewed: Basic Metabolic Panel: Recent Labs    01/06/19 0620 01/07/19 0420 01/08/19 0859  NA 147* 145 138  K 3.5 3.6 3.7  CL 112* 113* 104  CO2 28 27 29   GLUCOSE 99 95 211*  BUN 40* 29* 18  CREATININE 1.39* 1.20 0.90  CALCIUM 8.1* 7.8* 7.9*   Liver Function Tests: Recent Labs    01/04/19 1445  AST 33  ALT 47*  ALKPHOS 132*  BILITOT 0.8  PROT 6.6  ALBUMIN 2.7*   Recent Labs    01/04/19 1445  LIPASE 43   No results for input(s): AMMONIA in the last 8760 hours. CBC: Recent Labs    12/24/18 0850  01/04/19 1445 01/05/19 0715 01/06/19 0620  WBC 7.4   < > 12.6* 6.6 6.8  NEUTROABS 5.0  --   --  5.1  --   HGB 13.7   < > 14.4 13.8 13.6  HCT 43.7   < > 45.5 44.3 43.8  MCV 94.6   < > 94.0 95.7 98.4  PLT 271   < > 334 291 309   < > = values in this interval not displayed.   Cardiac Enzymes: No results for input(s): CKTOTAL, CKMB, CKMBINDEX, TROPONINI in the last 8760 hours. BNP: Invalid input(s): POCBNP Lab Results  Component Value Date   HGBA1C 7.6 (H) 08/13/2012   No results found for: TSH No results found for: VITAMINB12 No results found for: FOLATE No results found for: IRON, TIBC, FERRITIN  Imaging and Procedures obtained prior to SNF admission: No results found.  Assessment/Plan ESBL Positive UTI In Contact Isolation On Ertapenem Has Chronic foley Catheter. Last Day of Antibiotics is 02/05 WBC back to Normal Vomiting and Diarrhea GI pathogens were negative Vomiting Resolved Few episodes of Loose stool but no Diarrhea recently Nontraumatic subcortical hemorrhage of left cerebral hemisphere Severe Neuro Deficits Continue Ativan as needed for agitation  Will discontinue Seroquel as it is not helping D/W therapy Will start him  on Baclofen 5 mg BID for increased tone in UE Patient  making some progress. Continues to be dependent .Prognosis Poor IDDM (insulin dependent diabetes mellitus) BS Running more then 250 through ou tthe day Will increase his Lantus to 30 Units Also on Novolog sliding Scale Essential hypertension BP Stable Continue to monitor On Norvasc and metroprolol Wound of buttock, D/w wound care Santyl andCovaderm It is little better. Will stop Santyl and possible just do Covaderm Urinary retention Continue Foley for now. Urology follow uppending PEG Tube Feeding NPO for Now Speech to follow. More aggressive Oral Care On Glycerna again Gastric Ulcer On Protonix Follow with Gastro Hyperlipidemia On Atrovastatin   Family/ staff Communication:   Labs/tests ordered:  Total time spent in this patient care encounter was 45_ minutes; greater than 50% of the visit spent counseling patient, reviewing records , Labs and coordinating care for problems addressed at this encounter.

## 2019-01-15 ENCOUNTER — Ambulatory Visit (INDEPENDENT_AMBULATORY_CARE_PROVIDER_SITE_OTHER): Payer: Medicare Other | Admitting: Urology

## 2019-01-15 DIAGNOSIS — C61 Malignant neoplasm of prostate: Secondary | ICD-10-CM | POA: Diagnosis not present

## 2019-01-15 DIAGNOSIS — R3914 Feeling of incomplete bladder emptying: Secondary | ICD-10-CM

## 2019-01-15 DIAGNOSIS — N401 Enlarged prostate with lower urinary tract symptoms: Secondary | ICD-10-CM

## 2019-01-16 ENCOUNTER — Encounter (HOSPITAL_COMMUNITY)
Admission: RE | Admit: 2019-01-16 | Discharge: 2019-01-16 | Disposition: A | Discharge status: Home / Self Care | Payer: Medicare Other | Source: Skilled Nursing Facility | Attending: Internal Medicine | Admitting: Internal Medicine

## 2019-01-16 DIAGNOSIS — I69351 Hemiplegia and hemiparesis following cerebral infarction affecting right dominant side: Secondary | ICD-10-CM | POA: Insufficient documentation

## 2019-01-16 DIAGNOSIS — I69391 Dysphagia following cerebral infarction: Secondary | ICD-10-CM | POA: Insufficient documentation

## 2019-01-16 DIAGNOSIS — I610 Nontraumatic intracerebral hemorrhage in hemisphere, subcortical: Secondary | ICD-10-CM | POA: Insufficient documentation

## 2019-01-16 LAB — CBC WITH DIFFERENTIAL/PLATELET
Abs Immature Granulocytes: 0.08 10*3/uL — ABNORMAL HIGH (ref 0.00–0.07)
Basophils Absolute: 0.1 10*3/uL (ref 0.0–0.1)
Basophils Relative: 1 %
EOS PCT: 5 %
Eosinophils Absolute: 0.5 10*3/uL (ref 0.0–0.5)
HCT: 45.2 % (ref 39.0–52.0)
Hemoglobin: 13.9 g/dL (ref 13.0–17.0)
Immature Granulocytes: 1 %
Lymphocytes Relative: 15 %
Lymphs Abs: 1.5 10*3/uL (ref 0.7–4.0)
MCH: 29.6 pg (ref 26.0–34.0)
MCHC: 30.8 g/dL (ref 30.0–36.0)
MCV: 96.2 fL (ref 80.0–100.0)
Monocytes Absolute: 0.6 10*3/uL (ref 0.1–1.0)
Monocytes Relative: 6 %
Neutro Abs: 7.4 10*3/uL (ref 1.7–7.7)
Neutrophils Relative %: 72 %
Platelets: 287 10*3/uL (ref 150–400)
RBC: 4.7 MIL/uL (ref 4.22–5.81)
RDW: 13.3 % (ref 11.5–15.5)
WBC: 10.1 10*3/uL (ref 4.0–10.5)
nRBC: 0 % (ref 0.0–0.2)

## 2019-01-16 LAB — BASIC METABOLIC PANEL
Anion gap: 7 (ref 5–15)
BUN: 44 mg/dL — ABNORMAL HIGH (ref 8–23)
CO2: 31 mmol/L (ref 22–32)
Calcium: 8.7 mg/dL — ABNORMAL LOW (ref 8.9–10.3)
Chloride: 99 mmol/L (ref 98–111)
Creatinine, Ser: 1.1 mg/dL (ref 0.61–1.24)
GFR calc Af Amer: >60 mL/min (ref >60)
GFR calc non Af Amer: >60 mL/min (ref >60)
Glucose, Bld: 371 mg/dL — ABNORMAL HIGH (ref 70–99)
Potassium: 4.7 mmol/L (ref 3.5–5.1)
Sodium: 137 mmol/L (ref 135–145)

## 2019-01-21 ENCOUNTER — Encounter (HOSPITAL_COMMUNITY)
Admission: RE | Admit: 2019-01-21 | Discharge: 2019-01-21 | Disposition: A | Discharge status: Home / Self Care | Payer: Medicare Other | Source: Skilled Nursing Facility | Attending: *Deleted | Admitting: *Deleted

## 2019-01-23 ENCOUNTER — Other Ambulatory Visit (HOSPITAL_COMMUNITY): Payer: Self-pay | Admitting: Specialist

## 2019-01-23 DIAGNOSIS — R1319 Other dysphagia: Secondary | ICD-10-CM

## 2019-01-25 ENCOUNTER — Inpatient Hospital Stay (HOSPITAL_COMMUNITY)
Admit: 2019-01-25 | Discharge: 2019-01-25 | Disposition: A | Discharge status: Home / Self Care | Payer: Medicare Other | Attending: Internal Medicine | Admitting: Internal Medicine

## 2019-01-25 ENCOUNTER — Ambulatory Visit (HOSPITAL_COMMUNITY): Payer: Medicare Other | Attending: Internal Medicine | Admitting: Speech Pathology

## 2019-01-25 DIAGNOSIS — R1319 Other dysphagia: Secondary | ICD-10-CM

## 2019-01-25 NOTE — Therapy (Signed)
[place], [place], [address] Phone: [phone]   Fax:  [phone]  Modified Barium Swallow  Patient Details  Objective Swallowing Evaluation: Type of Study: MBS-Modified Barium Swallow Study   Patient Details  Name: [name] MRN: [id number] Date of Birth: March 16, 1946  Today's Date: 01/25/2019 Time: No data recorded-No data recorded No data recorded  Past Medical History:  Past Medical History:  Diagnosis Date  . A-fib ([place])    a. Post-op afib after CABG 08/2012.  [name] CAD (coronary artery disease)    a. NSTEMI s/p stent to distal RCA 03/2000. b. Inf MI s/p emergent thrombectomy/stenting mid RCA 10/2000. c. NSTEMI s/p CABGx4 (LIMA-LAD, SVG-OM2, seq SVG-acute marginal and PD) 12/17/05 - course complicated by confusion, post-op AF, L pleural effusion with thoracentesis. d. Normal LV function by echo 08/2012.  . Diabetes mellitus   . GERD (gastroesophageal reflux disease)   . Hiatal hernia   . HLD (hyperlipidemia)   . HTN (hypertension)   . Peripheral vascular disease ([place])    a. Carotid dopplers neg 08/13/12. b. Pre-cabg ABIs - R=0.87 suggesting mild dz, L=1.29 possibly falsely elevated due to calcified vessels.  . Pleural effusion    a. L pleural eff after CABG s/p thoracentesis 08/22/12.  . Prostate cancer [facility])    Status post radiation treatment.  . Prostatic hypertrophy    a. Hx of urinary retention, awaiting TURP.  . Tobacco abuse   . Valvular heart disease    a. Mild  MR by TEE 08/2012.   Past Surgical History:  Past Surgical History:  Procedure Laterality Date  . APPENDECTOMY    . BACK SURGERY    . CORONARY ARTERY BYPASS GRAFT  08/16/2012   Procedure: CORONARY ARTERY BYPASS GRAFTING (CABG);  Surgeon: Melrose Nakayama, MD;  Location: [place];  Service: Open Heart Surgery;  Laterality: N/A;  . LEFT HEART CATHETERIZATION WITH CORONARY ANGIOGRAM N/A 08/14/2012   Procedure: LEFT HEART CATHETERIZATION WITH  CORONARY ANGIOGRAM;  Surgeon: Peter M Martinique, MD;  Location: [facility] CATH LAB;  Service: Cardiovascular;  Laterality: N/A;   HPI: LAMARIO MANI is a 73 y/o male with PMH significant for hypertension, insulin-dependent diabetes mellitus, coronary artery disease and hemorrhagic CVA in December 2019 who has been at [facility], OT and PT since that time. He has had a PEG tube and has recieved minimal PO trials since his CVA secondary to mentation and inability to participate/follow commands. MBS was previously attempted during acute hospitalization after CVA and Pt was unable to participate causing study to be aborted.  He has tolerated some trials of ice chips and puree with treating SLP in SNF setting. MBS ordered this date to determine appropriate recommendations, strategies and/or initiate least restrictive PO diet.   No data recorded   Assessment / Plan / Recommendation  CHL IP CLINICAL IMPRESSIONS 01/25/2019  Clinical Impression Pt presents with moderately severe sensorimotor oropharyngeal dysphagia characterized by decreased oral coordination/awareness, delayed swallowing trigger, decreased laryngeal vestibule closure (LVC), penetration during the swallow (NTL & thin liquids) followed by SILENT aspiration after the swallow. Pt was able to inconsistenty follow commands to produce a hard cough and repeat dry swallows and efforts were effective in decreasing but not in clearing penetrates or visualized aspirates from laryngeal vestibule or from below the cords. With NTL note penetration and suspect eventual aspiration after penetrates mixed with thin residue and secretions. Note volume dependent improvement in epiglotic deflection, duration  and strength of pharyngeal squeeze and improved LVC as noted with heavy textures: HTL and puree textures resulting in no penetration or aspiration visualized with these consistencies. As trials progressed Pt with decreased ability to follow  commands and increased lethargy. Based on the above presentation today Recommend consider continue NPO status with initiation of trials of HTL and Puree with SLP only, increasing length and volume of trials over time; since Pt is cognitively unable to participate in laryngeal strengthening exercises, clinically consider dysphagia therapy to include frequent swallows both with consistencies and dry swallows. Recommend repeat MBS when it is clinically indicated as recommended by primary treating SLP.   SLP Visit Diagnosis Dysphagia, oropharyngeal phase (R13.12)  Attention and concentration deficit following --  Frontal lobe and executive function deficit following --  Impact on safety and function Moderate aspiration risk;Severe aspiration risk      CHL IP TREATMENT RECOMMENDATION 01/25/2019  Treatment Recommendations F/U MBS in --- days (Comment)     No flowsheet data found.  CHL IP DIET RECOMMENDATION 01/25/2019  SLP Diet Recommendations NPO  Liquid Administration via --  Medication Administration Via alternative means  Compensations Minimize environmental distractions;Slow rate;Small sips/bites;Follow solids with liquid;Multiple dry swallows after each bite/sip  Postural Changes --      CHL IP OTHER RECOMMENDATIONS 01/25/2019  Recommended Consults --  Oral Care Recommendations Oral care QID;Oral care before and after PO  Other Recommendations Order thickener from pharmacy      CHL IP FOLLOW UP RECOMMENDATIONS 01/25/2019  Follow up Recommendations Skilled Nursing facility      No flowsheet data found.         CHL IP ORAL PHASE 01/25/2019  Oral Phase Impaired  Oral - Pudding Teaspoon --  Oral - Pudding Cup --  Oral - Honey Teaspoon Piecemeal swallowing;Premature spillage;Decreased bolus cohesion  Oral - Honey Cup --  Oral - Nectar Teaspoon Decreased bolus cohesion;Premature spillage;Piecemeal swallowing  Oral - Nectar Cup Premature spillage;Decreased bolus cohesion;Piecemeal  swallowing  Oral - Nectar Straw --  Oral - Thin Teaspoon Premature spillage;Decreased bolus cohesion;Delayed oral transit;Piecemeal swallowing;Lingual/palatal residue  Oral - Thin Cup Lingual/palatal residue;Piecemeal swallowing;Delayed oral transit;Decreased bolus cohesion;Premature spillage  Oral - Thin Straw --  Oral - Puree Decreased bolus cohesion  Oral - Mech Soft --  Oral - Regular --  Oral - Multi-Consistency --  Oral - Pill --  Oral Phase - Comment --    CHL IP PHARYNGEAL PHASE 01/25/2019  Pharyngeal Phase --  Pharyngeal- Pudding Teaspoon --  Pharyngeal --  Pharyngeal- Pudding Cup --  Pharyngeal --  Pharyngeal- Honey Teaspoon --  Pharyngeal --  Pharyngeal- Honey Cup --  Pharyngeal --  Pharyngeal- Nectar Teaspoon --  Pharyngeal Material enters airway, passes BELOW cords without attempt by patient to eject out (silent aspiration)  Pharyngeal- Nectar Cup --  Pharyngeal Material enters airway, passes BELOW cords without attempt by patient to eject out (silent aspiration)  Pharyngeal- Nectar Straw --  Pharyngeal --  Pharyngeal- Thin Teaspoon --  Pharyngeal Material enters airway, passes BELOW cords without attempt by patient to eject out (silent aspiration)  Pharyngeal- Thin Cup --  Pharyngeal Material enters airway, passes BELOW cords without attempt by patient to eject out (silent aspiration)  Pharyngeal- Thin Straw --  Pharyngeal --  Pharyngeal- Puree --  Pharyngeal --  Pharyngeal- Mechanical Soft --  Pharyngeal --  Pharyngeal- Regular --  Pharyngeal --  Pharyngeal- Multi-consistency --  Pharyngeal --  Pharyngeal- Pill --  Pharyngeal --  Pharyngeal Comment --  CHL IP CERVICAL ESOPHAGEAL PHASE 01/25/2019  Cervical Esophageal Phase WFL  Pudding Teaspoon --  Pudding Cup --  Honey Teaspoon --  Honey Cup --  Nectar Teaspoon --  Nectar Cup --  Nectar Straw --  Thin Teaspoon --  Thin Cup --  Thin Straw --  Puree --  Mechanical Soft --  Regular --   Multi-consistency --  Pill --  Cervical Esophageal Comment --   Kynslie Ringle H. Roddie Mc, CCC-SLP Speech Language Pathologist   Wende Bushy 01/25/2019, 2:46 PM

## 2019-01-25 NOTE — Therapy (Deleted)
[place], [place], [address] Phone: [phone]   Fax:  [phone]  Modified Barium Swallow  Patient Details  Name: [name] MRN: [id number] Date of Birth: 1946-07-31 No data recorded  Encounter Date: 01/25/2019    Past Medical History:  Diagnosis Date  . A-fib ([place])    a. Post-op afib after CABG 08/2012.  [name] CAD (coronary artery disease)    a. NSTEMI s/p stent to distal RCA 03/2000. b. Inf MI s/p emergent thrombectomy/stenting mid RCA 10/2000. c. NSTEMI s/p CABGx4 (LIMA-LAD, SVG-OM2, seq SVG-acute marginal and PD) 04/20/15 - course complicated by confusion, post-op AF, L pleural effusion with thoracentesis. d. Normal LV function by echo 08/2012.  . Diabetes mellitus   . GERD (gastroesophageal reflux disease)   . Hiatal hernia   . HLD (hyperlipidemia)   . HTN (hypertension)   . Peripheral vascular disease ([place])    a. Carotid dopplers neg 08/13/12. b. Pre-cabg ABIs - R=0.87 suggesting mild dz, L=1.29 possibly falsely elevated due to calcified vessels.  . Pleural effusion    a. L pleural eff after CABG s/p thoracentesis 08/22/12.  . Prostate cancer [facility])    Status post radiation treatment.  . Prostatic hypertrophy    a. Hx of urinary retention, awaiting TURP.  . Tobacco abuse   . Valvular heart disease    a. Mild  MR by TEE 08/2012.    Past Surgical History:  Procedure Laterality Date  . APPENDECTOMY    . BACK SURGERY    . CORONARY ARTERY BYPASS GRAFT  08/16/2012   Procedure: CORONARY ARTERY BYPASS GRAFTING (CABG);  Surgeon: Melrose Nakayama, MD;  Location: [place];  Service: Open Heart Surgery;  Laterality: N/A;  . LEFT HEART CATHETERIZATION WITH CORONARY ANGIOGRAM N/A 08/14/2012   Procedure: LEFT HEART CATHETERIZATION WITH CORONARY ANGIOGRAM;  Surgeon: Peter M Martinique, MD;  Location: [facility] CATH LAB;  Service: Cardiovascular;  Laterality: N/A;    There were no vitals filed for this  visit.                           Patient will benefit from skilled therapeutic intervention in order to improve the following deficits and impairments:   No diagnosis found.        Problem List Patient Active Problem List   Diagnosis Date Noted  . Protein-calorie malnutrition, severe 01/08/2019  . UTI (urinary tract infection) 01/06/2019  . Pressure injury of skin 01/05/2019  . Acute UTI 01/04/2019  . Proctitis 01/04/2019  . History of hemorrhagic cerebrovascular accident (CVA) with residual deficit 01/04/2019  . Diarrhea 01/03/2019  . Urinary retention 12/24/2018  . Buttock wound 12/16/2018  . IVH (intraventricular hemorrhage) ([place]) 11/18/2018  . Nontraumatic subcortical hemorrhage of left cerebral hemisphere ([place]) 11/18/2018  . CAD (coronary artery disease)   . Peripheral vascular disease ([place])   . A-fib ([place])   . Prostatic hypertrophy   . NSTEMI (non-ST elevated myocardial infarction) ([place]) 08/15/2012  . IDDM (insulin dependent diabetes mellitus) ([place]) 08/15/2012  . HTN (hypertension) 08/15/2012  . Hyperlipidemia 08/15/2012    Wende Bushy 01/25/2019, 2:44 PM  [place] [address] [place], [place], [address] Phone: [phone]   Fax:  [phone]  Name: [name] MRN: [id number] Date of Birth: November 18, 1946

## 2019-01-28 ENCOUNTER — Other Ambulatory Visit: Payer: Self-pay | Admitting: Adult Health

## 2019-01-28 ENCOUNTER — Non-Acute Institutional Stay (SKILLED_NURSING_FACILITY): Payer: Medicare Other | Admitting: Adult Health

## 2019-01-28 ENCOUNTER — Encounter: Payer: Self-pay | Admitting: Adult Health

## 2019-01-28 ENCOUNTER — Encounter (HOSPITAL_COMMUNITY)
Admission: RE | Admit: 2019-01-28 | Discharge: 2019-01-28 | Disposition: A | Discharge status: Home / Self Care | Payer: Medicare Other | Source: Skilled Nursing Facility | Attending: Internal Medicine | Admitting: Internal Medicine

## 2019-01-28 DIAGNOSIS — E1169 Type 2 diabetes mellitus with other specified complication: Secondary | ICD-10-CM | POA: Insufficient documentation

## 2019-01-28 DIAGNOSIS — R1312 Dysphagia, oropharyngeal phase: Secondary | ICD-10-CM | POA: Diagnosis not present

## 2019-01-28 DIAGNOSIS — E1151 Type 2 diabetes mellitus with diabetic peripheral angiopathy without gangrene: Secondary | ICD-10-CM | POA: Insufficient documentation

## 2019-01-28 DIAGNOSIS — I152 Hypertension secondary to endocrine disorders: Secondary | ICD-10-CM

## 2019-01-28 DIAGNOSIS — F323 Major depressive disorder, single episode, severe with psychotic features: Secondary | ICD-10-CM

## 2019-01-28 DIAGNOSIS — I2510 Atherosclerotic heart disease of native coronary artery without angina pectoris: Secondary | ICD-10-CM

## 2019-01-28 DIAGNOSIS — E1159 Type 2 diabetes mellitus with other circulatory complications: Secondary | ICD-10-CM

## 2019-01-28 DIAGNOSIS — I4811 Longstanding persistent atrial fibrillation: Secondary | ICD-10-CM

## 2019-01-28 DIAGNOSIS — I610 Nontraumatic intracerebral hemorrhage in hemisphere, subcortical: Secondary | ICD-10-CM

## 2019-01-28 DIAGNOSIS — E43 Unspecified severe protein-calorie malnutrition: Secondary | ICD-10-CM

## 2019-01-28 DIAGNOSIS — I6930 Unspecified sequelae of cerebral infarction: Secondary | ICD-10-CM

## 2019-01-28 DIAGNOSIS — E785 Hyperlipidemia, unspecified: Secondary | ICD-10-CM

## 2019-01-28 DIAGNOSIS — I10 Essential (primary) hypertension: Secondary | ICD-10-CM

## 2019-01-28 DIAGNOSIS — F418 Other specified anxiety disorders: Secondary | ICD-10-CM | POA: Insufficient documentation

## 2019-01-28 LAB — CBC
HCT: 48.4 % (ref 39.0–52.0)
Hemoglobin: 14.9 g/dL (ref 13.0–17.0)
MCH: 30.3 pg (ref 26.0–34.0)
MCHC: 30.8 g/dL (ref 30.0–36.0)
MCV: 98.4 fL (ref 80.0–100.0)
Platelets: 282 10*3/uL (ref 150–400)
RBC: 4.92 MIL/uL (ref 4.22–5.81)
RDW: 13.5 % (ref 11.5–15.5)
WBC: 8.1 10*3/uL (ref 4.0–10.5)
nRBC: 0 % (ref 0.0–0.2)

## 2019-01-28 LAB — BASIC METABOLIC PANEL
Anion gap: 7 (ref 5–15)
BUN: 38 mg/dL — ABNORMAL HIGH (ref 8–23)
CO2: 33 mmol/L — ABNORMAL HIGH (ref 22–32)
Calcium: 9.2 mg/dL (ref 8.9–10.3)
Chloride: 103 mmol/L (ref 98–111)
Creatinine, Ser: 1.17 mg/dL (ref 0.61–1.24)
GFR calc Af Amer: >60 mL/min (ref >60)
GFR calc non Af Amer: >60 mL/min (ref >60)
Glucose, Bld: 285 mg/dL — ABNORMAL HIGH (ref 70–99)
Potassium: 4.3 mmol/L (ref 3.5–5.1)
Sodium: 143 mmol/L (ref 135–145)

## 2019-01-28 MED ORDER — LORAZEPAM 0.5 MG PO TABS: ORAL_TABLET | ORAL | 0 refills | Status: DC

## 2019-01-28 NOTE — Progress Notes (Addendum)
Location:   [place] Room Number: 130 P Place of Service:  SNF (31)   CODE STATUS: Full Code  No Known Allergies  Chief Complaint  Patient presents with  . Medical Management of Chronic Issues    One episode of  atrial fibrillation; hypertension associated with diabetes; dyslipidemia associated with type 2 diabetes mellitus. Weekly follow up for the first 30 days post hospitalization    HPI:  He is a 73 year [name] short term rehab patient being seen for the management of his chronic illnesses: afib; hypertension; dyslipidemia. There are no reports of uncontrolled pain; is tolerating tube feeding; no signs of aspiration; is restless at times.    Past Medical History:  Diagnosis Date  . A-fib ([place])    a. Post-op afib after CABG 08/2012.  [name] CAD (coronary artery disease)    a. NSTEMI s/p stent to distal RCA 03/2000. b. Inf MI s/p emergent thrombectomy/stenting mid RCA 10/2000. c. NSTEMI s/p CABGx4 (LIMA-LAD, SVG-OM2, seq SVG-acute marginal and PD) 1/0/27 - course complicated by confusion, post-op AF, L pleural effusion with thoracentesis. d. Normal LV function by echo 08/2012.  . Diabetes mellitus   . GERD (gastroesophageal reflux disease)   . Hiatal hernia   . HLD (hyperlipidemia)   . HTN (hypertension)   . Peripheral vascular disease ([place])    a. Carotid dopplers neg 9/2/Tanner. b. Pre-cabg ABIs - R=0.87 suggesting mild dz, L=1.29 possibly falsely elevated due to calcified vessels.  . Pleural effusion    a. L pleural eff after CABG s/p thoracentesis 9/11/Tanner.  . Prostate cancer [facility])    Status post radiation treatment.  . Prostatic hypertrophy    a. Hx of urinary retention, awaiting TURP.  . Tobacco abuse   . Valvular heart disease    a. Mild  MR by TEE 08/2012.    Past Surgical History:  Procedure Laterality Date  . APPENDECTOMY    . BACK SURGERY    . CORONARY ARTERY BYPASS GRAFT  08/16/2012   Procedure: CORONARY ARTERY BYPASS GRAFTING (CABG);   Surgeon: Melrose Nakayama, MD;  Location: [place];  Service: Open Heart Surgery;  Laterality: N/A;  . LEFT HEART CATHETERIZATION WITH CORONARY ANGIOGRAM N/A 08/14/2012   Procedure: LEFT HEART CATHETERIZATION WITH CORONARY ANGIOGRAM;  Surgeon: Peter M Martinique, MD;  Location: [facility] CATH LAB;  Service: Cardiovascular;  Laterality: N/A;    Social History   Socioeconomic History  . Marital status: Married    Spouse name: Not on file  . Number of children: Not on file  . Years of education: Not on file  . Highest education level: Not on file  Occupational History  . Not on file  Social Needs  . Financial resource strain: Not on file  . Food insecurity:    Worry: Not on file    Inability: Not on file  . Transportation needs:    Medical: Not on file    Non-medical: Not on file  Tobacco Use  . Smoking status: Former Smoker    Last attempt to quit: 11/12/1972    Years since quitting: 46.2  . Smokeless tobacco: Never Used  Substance and Sexual Activity  . Alcohol use: No  . Drug use: No  . Sexual activity: Never  Lifestyle  . Physical activity:    Days per week: Not on file    Minutes per session: Not on file  . Stress: Not on file  Relationships  . Social connections:    Talks  on phone: Not on file    Gets together: Not on file    Attends religious service: Not on file    Active member of club or organization: Not on file    Attends meetings of clubs or organizations: Not on file    Relationship status: Not on file  . Intimate partner violence:    Fear of current or ex partner: Not on file    Emotionally abused: Not on file    Physically abused: Not on file    Forced sexual activity: Not on file  Other Topics Concern  . Not on file  Social History Narrative  . Not on file   History reviewed. No pertinent family history.    VITAL SIGNS BP 134/70   Pulse 68   Temp 98.6 F (37 C)   Resp 17   Ht 5\' 11"  (1.803 m)   Wt 162 lb 6.4 oz (73.7 kg)   BMI 22.65 kg/m    Outpatient Encounter Medications as of 01/28/2019  Medication Sig  . amLODipine (NORVASC) 5 MG tablet Place 5 mg into feeding tube daily.  [name] atorvastatin (LIPITOR) 80 MG tablet Place 80 mg into feeding tube at bedtime.  . Baclofen 5 MG TABS 1 tablet by Gastric Tube route 2 (two) times daily.  . Dimethicone 1.3 % LOTN Apply 1 application topically See admin instructions. Apply to the extremities and back daily and as needed for itching /dry skin. Do not apply to buttocks wound.  . diphenoxylate-atropine (LOMOTIL) 2.5-0.025 MG tablet Place 2 tablets into feeding tube every 4 (four) hours as needed for diarrhea or loose stools.  [name] FLUoxetine (PROZAC) 20 MG/5ML solution Place 20 mg into feeding tube daily.   [name] GLUCERNA ([place]) LIQD Give 1.5  @@ 70cc per hour continuously via gastric tube.  Run for 20 hours each day.  Hold TF 8 am - 12 pm  . hydrocortisone cream (PREPARATION H) 1 % Apply rectally twice daily and as needed for hemorrhoids  . insulin aspart (NOVOLOG FLEXPEN) 100 UNIT/ML FlexPen Give subcutaneous per sliding scale every 6 hours; If blood sugars are less than 60, CALL MD 200-250= 4 units 251-300= 6 units 301-351= 8 units 351-400= 10 units If Blood sugar is greater than 400, call MD If blood sugars are greater than 351, Give 0 units, CALL MD  . Insulin Glargine (LANTUS SOLOSTAR) 100 UNIT/ML Solostar Pen Inject 30 Units into the skin daily.   [name] LORazepam (ATIVAN) 0.5 MG tablet Take one tablet po Q six hours prn anxiety x 14 days  . Melatonin-Pyridoxine 1-10 MG TABS Take 2 tablets by mouth at bedtime.  . metoprolol tartrate (LOPRESSOR) 25 MG tablet Take 0.5 tablets (12.5 mg total) by mouth 2 (two) times daily.  . QUEtiapine (SEROQUEL) 25 MG tablet Take 12.5 mg by mouth at bedtime.   . Water For Irrigation, Sterile (STERILE WATER FOR IRRIGATION) Flush with 250 cc every 6 hours   No facility-administered encounter medications on file as of 01/28/2019.      SIGNIFICANT DIAGNOSTIC  EXAMS  LABS REVIEWED TODAY:   01-04-19: urine culture: klebsiella oxytoca  01-16-19: wbc 10.1; hgb Tanner.9; hct 45.2; mcv 96.2; plt 287; glucose 371; bun 44; creat 1.10; k+ 4.7; na++137; ca 8.7 01-28-19: wbc 8.1; hgb 14.9; hct 48.4; mcv 98.;4 plt 282; glucose 285; bun 38; creat 1.17; k+ 4.3 na++ 143 ca 9.3    Review of Systems  Reason unable to perform ROS: nonverbal    Physical Exam Constitutional:  General: He is not in acute distress.    Appearance: He is well-developed. He is not diaphoretic.  Neck:     Musculoskeletal: Neck supple.     Thyroid: No thyromegaly.  Cardiovascular:     Rate and Rhythm: Normal rate and regular rhythm.     Heart sounds: Normal heart sounds.     Comments: Pedal pulses faint  Pulmonary:     Effort: Pulmonary effort is normal. No respiratory distress.     Breath sounds: Normal breath sounds.  Abdominal:     General: Bowel sounds are normal. There is no distension.     Palpations: Abdomen is soft.     Tenderness: There is no abdominal tenderness.     Comments: Peg tube present   Genitourinary:    Comments: Foley present  Musculoskeletal:     Right lower leg: No edema.     Left lower leg: No edema.     Comments: Right side hemiparesis Able to move left extremities   Lymphadenopathy:     Cervical: No cervical adenopathy.  Skin:    General: Skin is warm and dry.     Comments: Has resolving stage 3 sacral wound   Neurological:     Mental Status: He is alert. Mental status is at baseline.     Comments: Is aware   Psychiatric:        Mood and Affect: Mood normal.      ASSESSMENT/ PLAN:  TODAY:   1. Hypertension associated with diabetes: is stable b/p 134/70; will continue norvasc 5 mg daily and lopressor 12.5 mg twice daily   2. One episode of  a-fib: heart rate (post surgery)  is stable: will continue lopressor 12.5 mg twice daily   3. Dyslipidemia associated with type 2 diabetes mellitus: is stable will continue lipitor 80 mg daily    4. Dysphagia oropharyngeal phase: no signs of aspiration: is peg tube dependent is on glucerna 70 cc per hour for 20 hour daily   5. Nontraumatic subcortical hemorrhage of left cerebral hemisphere/history of hemorrhagic cerebrovascular accident with residual deficit: is without change in status; will continu baclofen 5 mg twice daily for spasticity will monitor   6. Protein calorie malnutrition; severe: is without change weight is stable 162 pounds   7. CAD: native artery; native heart without angina: is status post CABG 2013: stable will monitor   8.  Major depression with psychotic features : is stable will continue prozac 20 mg daily seroquel 12.5 mg nightly has ativan 0.5 mg every 6 hours as needed for 14 days.   9. Type 2 diabetes mellitus with peripheral vascular disease: is stable will continue lantus 30 units nightly and will continue novolog SSI: 200-250: 4 units; 251-300: 6 units; 301-350: 8 units; 351-400: 10 units   Will get cmp; hgb a1c lipids       MD is aware of resident's narcotic use and is in agreement with current plan of care. We will attempt to wean resident as apropriate   Ok Edwards NP [facility]  Contact [phone] Monday through Friday 8am- 5pm  After hours call [phone]

## 2019-01-29 ENCOUNTER — Encounter: Payer: Self-pay | Admitting: Adult Health

## 2019-01-29 ENCOUNTER — Encounter (HOSPITAL_COMMUNITY)
Admission: RE | Admit: 2019-01-29 | Discharge: 2019-01-29 | Disposition: A | Discharge status: Home / Self Care | Payer: Medicare Other | Source: Skilled Nursing Facility | Attending: Internal Medicine | Admitting: Internal Medicine

## 2019-01-29 ENCOUNTER — Non-Acute Institutional Stay (SKILLED_NURSING_FACILITY): Payer: Medicare Other | Admitting: Adult Health

## 2019-01-29 DIAGNOSIS — I610 Nontraumatic intracerebral hemorrhage in hemisphere, subcortical: Secondary | ICD-10-CM | POA: Insufficient documentation

## 2019-01-29 DIAGNOSIS — E1169 Type 2 diabetes mellitus with other specified complication: Secondary | ICD-10-CM | POA: Diagnosis not present

## 2019-01-29 DIAGNOSIS — E785 Hyperlipidemia, unspecified: Secondary | ICD-10-CM

## 2019-01-29 DIAGNOSIS — E43 Unspecified severe protein-calorie malnutrition: Secondary | ICD-10-CM | POA: Diagnosis not present

## 2019-01-29 DIAGNOSIS — F69 Unspecified disorder of adult personality and behavior: Secondary | ICD-10-CM | POA: Insufficient documentation

## 2019-01-29 DIAGNOSIS — K629 Disease of anus and rectum, unspecified: Secondary | ICD-10-CM | POA: Insufficient documentation

## 2019-01-29 DIAGNOSIS — E1151 Type 2 diabetes mellitus with diabetic peripheral angiopathy without gangrene: Secondary | ICD-10-CM | POA: Diagnosis not present

## 2019-01-29 LAB — LIPID PANEL
CHOLESTEROL: 116 mg/dL (ref 0–200)
HDL: 19 mg/dL — ABNORMAL LOW (ref >40)
LDL Cholesterol: 50 mg/dL (ref 0–99)
Total CHOL/HDL Ratio: 6.1 RATIO
Triglycerides: 236 mg/dL — ABNORMAL HIGH (ref <150)
VLDL: 47 mg/dL — ABNORMAL HIGH (ref 0–40)

## 2019-01-29 LAB — COMPREHENSIVE METABOLIC PANEL
ALBUMIN: 2.7 g/dL — AB (ref 3.5–5.0)
ALT: 59 U/L — ABNORMAL HIGH (ref 0–44)
AST: 52 U/L — ABNORMAL HIGH (ref 15–41)
Alkaline Phosphatase: 121 U/L (ref 38–126)
Anion gap: 8 (ref 5–15)
BUN: 39 mg/dL — AB (ref 8–23)
CO2: 30 mmol/L (ref 22–32)
Calcium: 9 mg/dL (ref 8.9–10.3)
Chloride: 101 mmol/L (ref 98–111)
Creatinine, Ser: 1.17 mg/dL (ref 0.61–1.24)
GFR calc Af Amer: >60 mL/min (ref >60)
GFR calc non Af Amer: >60 mL/min (ref >60)
GLUCOSE: 263 mg/dL — AB (ref 70–99)
Potassium: 4 mmol/L (ref 3.5–5.1)
Sodium: 139 mmol/L (ref 135–145)
Total Bilirubin: 0.6 mg/dL (ref 0.3–1.2)
Total Protein: 6 g/dL — ABNORMAL LOW (ref 6.5–8.1)

## 2019-01-29 NOTE — Progress Notes (Signed)
Location:   [place] Room Number: 130 P Place of Service:  SNF (31)   CODE STATUS: Full Code  No Known Allergies  Chief Complaint  Patient presents with  . Acute Visit    Lab follow up    HPI:  His labs have returned: ldl is 50; albumin 2.7; hgb a1c 7.6. he is dependent upon tube feeding for his nutritional support. He will need further protein; his a1c is stable. There are no reports of aspiration; no uncontrolled pain. He is restless at times.    Past Medical History:  Diagnosis Date  . A-fib ([place])    a. Post-op afib after CABG 08/2012.  [name] CAD (coronary artery disease)    a. NSTEMI s/p stent to distal RCA 03/2000. b. Inf MI s/p emergent thrombectomy/stenting mid RCA 10/2000. c. NSTEMI s/p CABGx4 (LIMA-LAD, SVG-OM2, seq SVG-acute marginal and PD) 05/12/94 - course complicated by confusion, post-op AF, L pleural effusion with thoracentesis. d. Normal LV function by echo 08/2012.  . Diabetes mellitus   . GERD (gastroesophageal reflux disease)   . Hiatal hernia   . HLD (hyperlipidemia)   . HTN (hypertension)   . Peripheral vascular disease ([place])    a. Carotid dopplers neg 08/13/12. b. Pre-cabg ABIs - R=0.87 suggesting mild dz, L=1.29 possibly falsely elevated due to calcified vessels.  . Pleural effusion    a. L pleural eff after CABG s/p thoracentesis 08/22/12.  . Prostate cancer [facility])    Status post radiation treatment.  . Prostatic hypertrophy    a. Hx of urinary retention, awaiting TURP.  . Tobacco abuse   . Valvular heart disease    a. Mild  MR by TEE 08/2012.    Past Surgical History:  Procedure Laterality Date  . APPENDECTOMY    . BACK SURGERY    . CORONARY ARTERY BYPASS GRAFT  08/16/2012   Procedure: CORONARY ARTERY BYPASS GRAFTING (CABG);  Surgeon: Melrose Nakayama, MD;  Location: [place];  Service: Open Heart Surgery;  Laterality: N/A;  . LEFT HEART CATHETERIZATION WITH CORONARY ANGIOGRAM N/A 08/14/2012   Procedure: LEFT HEART  CATHETERIZATION WITH CORONARY ANGIOGRAM;  Surgeon: Peter M Martinique, MD;  Location: [facility] CATH LAB;  Service: Cardiovascular;  Laterality: N/A;    Social History   Socioeconomic History  . Marital status: Married    Spouse name: Not on file  . Number of children: Not on file  . Years of education: Not on file  . Highest education level: Not on file  Occupational History  . Not on file  Social Needs  . Financial resource strain: Not on file  . Food insecurity:    Worry: Not on file    Inability: Not on file  . Transportation needs:    Medical: Not on file    Non-medical: Not on file  Tobacco Use  . Smoking status: Former Smoker    Last attempt to quit: 11/12/1972    Years since quitting: 46.2  . Smokeless tobacco: Never Used  Substance and Sexual Activity  . Alcohol use: No  . Drug use: No  . Sexual activity: Never  Lifestyle  . Physical activity:    Days per week: Not on file    Minutes per session: Not on file  . Stress: Not on file  Relationships  . Social connections:    Talks on phone: Not on file    Gets together: Not on file    Attends religious service: Not on file  Active member of club or organization: Not on file    Attends meetings of clubs or organizations: Not on file    Relationship status: Not on file  . Intimate partner violence:    Fear of current or ex partner: Not on file    Emotionally abused: Not on file    Physically abused: Not on file    Forced sexual activity: Not on file  Other Topics Concern  . Not on file  Social History Narrative  . Not on file   History reviewed. No pertinent family history.    VITAL SIGNS BP 133/69   Pulse 77   Temp 97.6 F (36.4 C)   Resp 18   Ht 5\' 11"  (1.803 m)   Wt 162 lb 6.4 oz (73.7 kg)   BMI 22.65 kg/m   Outpatient Encounter Medications as of 01/29/2019  Medication Sig  . amLODipine (NORVASC) 5 MG tablet Place 5 mg into feeding tube daily.  [name] atorvastatin (LIPITOR) 80 MG tablet Place 80 mg into  feeding tube at bedtime.  . Baclofen 5 MG TABS 1 tablet by Gastric Tube route 2 (two) times daily.  . Dimethicone 1.3 % LOTN Apply 1 application topically See admin instructions. Apply to the extremities and back daily and as needed for itching /dry skin. Do not apply to buttocks wound.  . diphenoxylate-atropine (LOMOTIL) 2.5-0.025 MG tablet Place 2 tablets into feeding tube every 4 (four) hours as needed for diarrhea or loose stools.  [name] FLUoxetine (PROZAC) 20 MG/5ML solution Place 20 mg into feeding tube daily.   [name] GLUCERNA (Pamplico) LIQD Give 1.5  @@ 70cc per hour continuously via gastric tube.  Run for 20 hours each day.  Hold TF 8 am - 12 pm  . hydrocortisone cream (PREPARATION H) 1 % Apply rectally twice daily and as needed for hemorrhoids  . insulin aspart (NOVOLOG FLEXPEN) 100 UNIT/ML FlexPen Give subcutaneous per sliding scale every 6 hours; If blood sugars are less than 60, CALL MD 200-250= 4 units 251-300= 6 units 301-351= 8 units 351-400= 10 units If Blood sugar is greater than 400, call MD If blood sugars are greater than 351, Give 0 units, CALL MD  . Insulin Glargine (LANTUS SOLOSTAR) 100 UNIT/ML Solostar Pen Inject 30 Units into the skin daily.   [name] LORazepam (ATIVAN) 0.5 MG tablet Take one tablet po Q six hours prn anxiety x 14 days  . Melatonin-Pyridoxine 1-10 MG TABS Take 2 tablets by mouth at bedtime.  . metoprolol tartrate (LOPRESSOR) 25 MG tablet Take 0.5 tablets (12.5 mg total) by mouth 2 (two) times daily.  . NON FORMULARY Diet Type:   NPO  . QUEtiapine (SEROQUEL) 25 MG tablet Take 12.5 mg by mouth at bedtime.   . Water For Irrigation, Sterile (STERILE WATER FOR IRRIGATION) Flush with 250 cc every 6 hours   No facility-administered encounter medications on file as of 01/29/2019.      SIGNIFICANT DIAGNOSTIC EXAMS  LABS REVIEWED PREVIOUS:   01-04-19: urine culture: klebsiella oxytoca  01-16-19: wbc 10.1; hgb 13.9; hct 45.2; mcv 96.2; plt 287; glucose 371; bun 44; creat  1.10; k+ 4.7; na++137; ca 8.7 01-28-19: wbc 8.1; hgb 14.9; hct 48.4; mcv 98.;4 plt 282; glucose 285; bun 38; creat 1.17; k+ 4.3 na++ 143 ca 9.3   TODAY;   01-29-19: glucose 263; bun 39; creat 1.17;  k+ 4.0; na++ 139; ca 9.0; ast 52; alt 59  albumin 2.7 hgb a1c 7.6; chol 116; ldl 50; trig 236; hdl  19    Review of Systems  Reason unable to perform ROS: nonverbal     Physical Exam Constitutional:      General: He is not in acute distress.    Appearance: He is well-developed. He is not diaphoretic.  Neck:     Thyroid: No thyromegaly.  Cardiovascular:     Rate and Rhythm: Normal rate and regular rhythm.     Heart sounds: Normal heart sounds.     Comments: Pedal pulses faint Pulmonary:     Effort: Pulmonary effort is normal. No respiratory distress.     Breath sounds: Normal breath sounds.  Abdominal:     General: Bowel sounds are normal. There is no distension.     Palpations: Abdomen is soft.     Tenderness: There is no abdominal tenderness.     Comments: Peg tube   Genitourinary:    Comments: Foley present  Musculoskeletal:     Right lower leg: No edema.     Left lower leg: No edema.     Comments: Right side hemiparesis Able to move left extremities    Lymphadenopathy:     Cervical: No cervical adenopathy.  Skin:    General: Skin is warm and dry.     Comments:  Has resolving stage 3 sacral wound    Neurological:     Mental Status: He is alert. Mental status is at baseline.     Comments: Is he aware   Psychiatric:        Mood and Affect: Mood normal.      ASSESSMENT/ PLAN:  TODAY:   1. Dyslipidemia associated with type 2 diabetes mellitus: is stable ldl 50  will lower lipitor to 40 mg daily  2.  Protein calorie malnutrition; severe: is without change weight is stable 162 pounds albumin is 2.7; will begin prostat three times daily   3. Type 2 diabetes mellitus with peripheral vascular disease: is stable hgb a1c 7.6 will continue lantus 30 units nightly and will  continue novolog SSI: 200-250: 4 units; 251-300: 6 units; 301-350: 8 units; 351-400: 10 units     MD is aware of resident's narcotic use and is in agreement with current plan of care. We will attempt to wean resident as apropriate   Ok Edwards NP [facility]  Contact [phone] Monday through Friday 8am- 5pm  After hours call [phone]

## 2019-01-30 LAB — HEMOGLOBIN A1C
Hgb A1c MFr Bld: 7.6 % — ABNORMAL HIGH (ref 4.8–5.6)
Mean Plasma Glucose: 171 mg/dL

## 2019-01-31 ENCOUNTER — Non-Acute Institutional Stay (SKILLED_NURSING_FACILITY): Payer: Medicare Other | Admitting: Internal Medicine

## 2019-01-31 ENCOUNTER — Encounter: Payer: Self-pay | Admitting: Internal Medicine

## 2019-01-31 DIAGNOSIS — E785 Hyperlipidemia, unspecified: Secondary | ICD-10-CM

## 2019-01-31 DIAGNOSIS — I610 Nontraumatic intracerebral hemorrhage in hemisphere, subcortical: Secondary | ICD-10-CM | POA: Diagnosis not present

## 2019-01-31 DIAGNOSIS — E119 Type 2 diabetes mellitus without complications: Secondary | ICD-10-CM | POA: Diagnosis not present

## 2019-01-31 DIAGNOSIS — R339 Retention of urine, unspecified: Secondary | ICD-10-CM | POA: Diagnosis not present

## 2019-01-31 DIAGNOSIS — R1312 Dysphagia, oropharyngeal phase: Secondary | ICD-10-CM | POA: Diagnosis not present

## 2019-01-31 DIAGNOSIS — IMO0001 Reserved for inherently not codable concepts without codable children: Secondary | ICD-10-CM

## 2019-01-31 DIAGNOSIS — Z794 Long term (current) use of insulin: Secondary | ICD-10-CM

## 2019-01-31 NOTE — Progress Notes (Addendum)
Location:  [place] Room Number: [place] of Service:  SNF [phone]) Provider:  Veleta Miners, MD  [name], MD  Patient Care Team: [name], MD as PCP - General (Family Medicine) [name], MD as Consulting Physician (Urology)  Extended Emergency Contact Information Primary Emergency Contact: [name] Address: [address]          [place] [address] [name] of [place] Phone: [phone] Mobile Phone: [phone] Relation: Spouse Secondary Emergency Contact: [name] States of [place] Phone: [phone] Relation: Son  Code Status:  Full Code Goals of care: Advanced Directive information Advanced Directives 01/31/2019  Does Patient Have a Medical Advance Directive? No  Type of Advance Directive -  Does patient want to make changes to medical advance directive? No - Patient declined  Would patient like information on creating a medical advance directive? No - Patient declined  Pre-existing out of facility DNR order (yellow form or pink MOST form) -     Chief Complaint  Patient presents with  . Acute Visit    Difficulty Swallowing    HPI:  Pt is a 73 y.o. male seen today for an acute visit to discuss with Speech after patietn failed his Modified barium swallow. Patient has been in SNF for therapy after staying in the [facility] hospital from 12/08-01/10 with Acute nontraumatic subcortical hemorrhage of left hemisphere. His stay was complicated by Aspiration Pneumonia requiring PEG tube placement, Urinary Retention with Foley catheter, Sacral Pressure wound Gastric ulcer and  Restlessness. And He was recently also admitted to the hospital for Proctitis and ESBL Bactremia  Patient is here for Therapy. He continues to be on Feeding tube as he failed his Modified Barium Swallow. Insurance has also cut off his therapy as he has peaked and not making much Progress. He continues to be completely Dependent for his  ADLS. He is responding more but still stays aphasic .  Past Medical History:  Diagnosis Date  . A-fib ([place])    a. Post-op afib after CABG 08/2012.  [name] CAD (coronary artery disease)    a. NSTEMI s/p stent to distal RCA 03/2000. [name]. Inf MI s/p emergent thrombectomy/stenting mid RCA 10/2000. c. NSTEMI s/p CABGx4 (LIMA-LAD, SVG-OM2, seq SVG-acute marginal and PD) 03/17/79 - course complicated by confusion, post-op AF, L pleural effusion with thoracentesis. d. Normal LV function by echo 08/2012.  . Diabetes mellitus   . GERD (gastroesophageal reflux disease)   . Hiatal hernia   . HLD (hyperlipidemia)   . HTN (hypertension)   . Peripheral vascular disease ([place])    a. Carotid dopplers neg 08/13/12. [name]. Pre-cabg ABIs - R=0.87 suggesting mild dz, L=1.29 possibly falsely elevated due to calcified vessels.  . Pleural effusion    a. L pleural eff after CABG s/p thoracentesis 08/22/12.  . Prostate cancer [facility])    Status post radiation treatment.  . Prostatic hypertrophy    a. Hx of urinary retention, awaiting TURP.  . Tobacco abuse   . Valvular heart disease    a. Mild  MR by TEE 08/2012.   Past Surgical History:  Procedure Laterality Date  . APPENDECTOMY    . BACK SURGERY    . CORONARY ARTERY BYPASS GRAFT  08/16/2012   Procedure: CORONARY ARTERY BYPASS GRAFTING (CABG);  Surgeon: Melrose Nakayama, MD;  Location: [place];  Service: Open Heart Surgery;  Laterality: N/A;  . LEFT HEART CATHETERIZATION WITH CORONARY ANGIOGRAM N/A 08/14/2012   Procedure: LEFT HEART CATHETERIZATION  WITH CORONARY ANGIOGRAM;  Surgeon: Peter M Martinique, MD;  Location: [facility] CATH LAB;  Service: Cardiovascular;  Laterality: N/A;    No Known Allergies  Outpatient Encounter Medications as of 01/31/2019  Medication Sig  . Amino Acids-Protein Hydrolys (FEEDING SUPPLEMENT, PRO-STAT SUGAR FREE 64,) LIQD Take 30 mLs by mouth 3 (three) times daily with meals.  [name] amLODipine (NORVASC) 5 MG tablet Place 5 mg into feeding tube daily.  [name]  atorvastatin (LIPITOR) 40 MG tablet Place 40 mg into feeding tube at bedtime.   . Baclofen 5 MG TABS 1 tablet by Gastric Tube route 2 (two) times daily.  . Dimethicone 1.3 % LOTN Apply 1 application topically See admin instructions. Apply to the extremities and back daily and as needed for itching /dry skin. Do not apply to buttocks wound.  . diphenoxylate-atropine (LOMOTIL) 2.5-0.025 MG tablet Place 2 tablets into feeding tube every 4 (four) hours as needed for diarrhea or loose stools.  [name] FLUoxetine (PROZAC) 20 MG/5ML solution Place 20 mg into feeding tube daily.   [name] GLUCERNA ([place]) LIQD Give 1.5  @@ 70cc per hour continuously via gastric tube.  Run for 20 hours each day.  Hold TF 8 am - 12 pm  . hydrocortisone cream (PREPARATION H) 1 % Apply rectally twice daily and as needed for hemorrhoids  . insulin aspart (NOVOLOG FLEXPEN) 100 UNIT/ML FlexPen Give subcutaneous per sliding scale every 6 hours; If blood sugars are less than 60, CALL MD 200-250= 4 units 251-300= 6 units 301-351= 8 units 351-400= 10 units If Blood sugar is greater than 400, call MD If blood sugars are greater than 351, Give 0 units, CALL MD  . Insulin Glargine (LANTUS SOLOSTAR) 100 UNIT/ML Solostar Pen Inject 30 Units into the skin daily.   . lansoprazole (PREVACID SOLUTAB) 30 MG disintegrating tablet Place 30 mg into feeding tube 2 (two) times daily.  [name] LORazepam (ATIVAN) 0.5 MG tablet Take one tablet po Q six hours prn anxiety x 14 days  . Melatonin 1 MG TABS Take 2 tablets by mouth at bedtime.  . metoprolol tartrate (LOPRESSOR) 25 MG tablet Take 0.5 tablets (12.5 mg total) by mouth 2 (two) times daily.  . NON FORMULARY Diet Type:   NPO  . QUEtiapine (SEROQUEL) 25 MG tablet Take 12.5 mg by mouth at bedtime.   . Water For Irrigation, Sterile (STERILE WATER FOR IRRIGATION) Flush with 250 cc every 6 hours  . Wound Dressings (HYDROGEL EX) Apply hydrogel moistened gauze and cover with coverderm dressing.  Change daily and  as needed  . [DISCONTINUED] Melatonin-Pyridoxine 1-10 MG TABS Take 2 tablets by mouth at bedtime.   No facility-administered encounter medications on file as of 01/31/2019.     Review of Systems  Unable to perform ROS: Dementia     There is no immunization history on file for this patient. Pertinent  Health Maintenance Due  Topic Date Due  . FOOT EXAM  02/26/2019 (Originally 06/14/1956)  . OPHTHALMOLOGY EXAM  02/26/2019 (Originally 06/14/1956)  . URINE MICROALBUMIN  02/26/2019 (Originally 06/14/1956)  . COLONOSCOPY  02/26/2019 (Originally 06/14/1994)  . PNA vac Low Risk Adult (1 of 2 - PCV13) 02/26/2019 (Originally 06/14/2009)  . INFLUENZA VACCINE  03/12/2019 (Originally 07/12/2018)  . HEMOGLOBIN A1C  07/30/2019   No flowsheet data found. Functional Status Survey:    Vitals:   01/31/19 1144  BP: 131/84  Pulse: 88  Resp: 20  Temp: 97.7 F (36.5 C)  Weight: 164 lb 3.2 oz (74.5 kg)  Height:  5\' 11"  (1.803 m)   Body mass index is 22.9 kg/m. Physical Exam Vitals signs reviewed.  Constitutional:      Appearance: Normal appearance.  HENT:     Head: Normocephalic.     Nose: Nose normal.  Eyes:     Pupils: Pupils are equal, round, and reactive to light.  Neck:     Musculoskeletal: Neck supple.  Cardiovascular:     Rate and Rhythm: Normal rate and regular rhythm.     Pulses: Normal pulses.     Heart sounds: Normal heart sounds.  Pulmonary:     Effort: Pulmonary effort is normal. No respiratory distress.     Breath sounds: Normal breath sounds. No wheezing or rales.  Abdominal:     General: Abdomen is flat. Bowel sounds are normal. There is no distension.     Palpations: Abdomen is soft.     Tenderness: There is no abdominal tenderness. There is no guarding.  Musculoskeletal:        General: No swelling.  Skin:    General: Skin is warm.     Comments: Sacral Pressure ulcer is almost Healed  Neurological:     Mental Status: He is alert.     Comments: Patient has Right UE  hemiparesis. Does move his LE involuntarily. Good Strength in Left Upper and Lower extremities. Aphasic. Does follow some simple commands. No sensible words.  Psychiatric:     Comments: Cannot assess     Labs reviewed: Recent Labs    01/16/19 0700 01/28/19 0730 01/29/19 0700  NA 137 143 139  K 4.7 4.3 4.0  CL 99 103 101  CO2 31 33* 30  GLUCOSE 371* 285* 263*  BUN 44* 38* 39*  CREATININE 1.10 1.17 1.17  CALCIUM 8.7* 9.2 9.0   Recent Labs    01/04/19 1445 01/29/19 0700  AST 33 52*  ALT 47* 59*  ALKPHOS 132* 121  BILITOT 0.8 0.6  PROT 6.6 6.0*  ALBUMIN 2.7* 2.7*   Recent Labs    12/24/18 0850  01/05/19 0715 01/06/19 0620 01/16/19 0700 01/28/19 0730  WBC 7.4   < > 6.6 6.8 10.1 8.1  NEUTROABS 5.0  --  5.1  --  7.4  --   HGB 13.7   < > 13.8 13.6 13.9 14.9  HCT 43.7   < > 44.3 43.8 45.2 48.4  MCV 94.6   < > 95.7 98.4 96.2 98.4  PLT 271   < > 291 309 287 282   < > = values in this interval not displayed.   No results found for: TSH Lab Results  Component Value Date   HGBA1C 7.6 (H) 01/29/2019   Lab Results  Component Value Date   CHOL 116 01/29/2019   HDL 19 (L) 01/29/2019   LDLCALC 50 01/29/2019   TRIG 236 (H) 01/29/2019   CHOLHDL 6.1 01/29/2019    Significant Diagnostic Results in last 30 days:  Ct Head Wo Contrast  Result Date: 01/04/2019 CLINICAL DATA:  Initial evaluation for acute altered mental status. EXAM: CT HEAD WITHOUT CONTRAST TECHNIQUE: Contiguous axial images were obtained from the base of the skull through the vertex without intravenous contrast. COMPARISON:  Prior CT from 11/17/2018. FINDINGS: Brain: Age-related cerebral atrophy with mild chronic small vessel ischemic disease, stable. There has been interval evolution of previously identified left thalamic hemorrhage, now late subacute in appearance with decreased size and attenuation as from previous. Mild residual subacute blood products measure approximately 2.0 x 1.6 cm. Residual surrounding  vasogenic edema within this region with mild mass effect on the adjacent left lateral ventricle. Trace residual left-to-right midline shift. Previously seen intraventricular hemorrhage has largely resolved. Overall ventricular size mildly increased without frank hydrocephalus. Basilar cisterns remain patent. No other acute intracranial hemorrhage. No acute large vessel territory infarct. No other mass lesion. No extra-axial fluid collection. Vascular: No hyperdense vessel. Scattered vascular calcifications noted within the carotid siphons. Skull: Scalp soft tissues and calvarium within normal limits. Sinuses/Orbits: Globes orbital soft tissues demonstrate no acute finding. Mild scattered mucosal thickening within the ethmoidal air cells. Visualized paranasal sinuses are otherwise clear. No mastoid effusion. Other: None. IMPRESSION: 1. Normal expected interval evolution of late subacute left thalamic hemorrhage. Associated intraventricular hemorrhage has resolved. Overall ventricular size mildly increased from previous without frank hydrocephalus. 2. No other acute intracranial abnormality. 3. Underlying age-related cerebral atrophy with chronic small vessel ischemic disease. Electronically Signed   By: Jeannine Boga M.D.   On: 01/04/2019 18:16   Ct Abdomen Pelvis W Contrast  Result Date: 01/04/2019 CLINICAL DATA:  Initial evaluation for acute nausea, vomiting, diarrhea. EXAM: CT ABDOMEN AND PELVIS WITH CONTRAST TECHNIQUE: Multidetector CT imaging of the abdomen and pelvis was performed using the standard protocol following bolus administration of intravenous contrast. CONTRAST:  16mL ISOVUE-300 IOPAMIDOL (ISOVUE-300) INJECTION 61% COMPARISON:  Prior CT from 11/14/2014. FINDINGS: Lower chest: Scattered subsegmental atelectatic changes present within the visualized lung bases. Trace left pleural effusion. Visualized lungs are otherwise grossly clear. Hepatobiliary: Irregular somewhat linear hypodensity  within the central aspect of the right hepatic lobe noted, indeterminate, but stable from previous, and of doubtful significance given stability. Liver otherwise within normal limits. Gallbladder surgically absent. No biliary dilatation. Pancreas: Pancreas grossly within normal limits. Spleen: Unremarkable. Adrenals/Urinary Tract: Adrenal glands are normal. Kidneys equal in size with symmetric enhancement. No nephrolithiasis, hydronephrosis, or focal enhancing renal mass. No appreciable hydroureter. Bladder decompressed with a Foley catheter in place. Gas lucency within the bladder lumen compatible with catheterization. Stomach/Bowel: Percutaneous gastrostomy tube in place within the stomach. No adverse features. Focal diverticulum arising from the second portion of the duodenum, grossly similar to previous. No obvious associated inflammation on this motion degraded exam. No evidence for bowel obstruction. Colonic diverticulosis without evidence for acute diverticulitis. Circumferential wall thickening with edema about the rectosigmoid colon, suggesting acute proctitis. Hazy inflammatory stranding within the adjacent perirectal fat. No evidence for perforation or other complication. Vascular/Lymphatic: Normal intravascular enhancement seen throughout the intra-abdominal aorta. Moderate atherosclerotic change. No aneurysm. Mesenteric vessels patent proximally. No adenopathy. Reproductive: Prostate within normal limits. Other: Small fat containing left inguinal hernia noted without associated inflammation. No free air or fluid. Musculoskeletal: No acute osseous abnormality. No discrete lytic or blastic osseous lesions. IMPRESSION: 1. Circumferential wall thickening and edema about the rectosigmoid colon, suggesting acute proctitis. No complication. 2. Colonic diverticulosis without evidence for acute diverticulitis. 3. Percutaneous gastrostomy tube in place without adverse features. 4. Trace left pleural effusion.  Aortic Atherosclerosis (ICD10-I70.0). Electronically Signed   By: Jeannine Boga M.D.   On: 01/04/2019 18:27   Dg Op Swallowing Func-medicare/speech Path  Result Date: 01/25/2019 Objective Swallowing Evaluation: Type of Study: MBS-Modified Barium Swallow Study  Patient Details Name: [name] MRN: [id number] Date of Birth: 1946-09-19 Today's Date: 01/25/2019 Time: No data recorded-No data recorded No data recorded Past Medical History: Past Medical History: Diagnosis Date . A-fib ([place])   a. Post-op afib after CABG 08/2012. [name] CAD (coronary artery disease)   a. NSTEMI s/p stent to distal  RCA 03/2000. [name]. Inf MI s/p emergent thrombectomy/stenting mid RCA 10/2000. c. NSTEMI s/p CABGx4 (LIMA-LAD, SVG-OM2, seq SVG-acute marginal and PD) 08/16/08 - course complicated by confusion, post-op AF, L pleural effusion with thoracentesis. d. Normal LV function by echo 08/2012. . Diabetes mellitus  . GERD (gastroesophageal reflux disease)  . Hiatal hernia  . HLD (hyperlipidemia)  . HTN (hypertension)  . Peripheral vascular disease ([place])   a. Carotid dopplers neg 08/13/12. [name]. Pre-cabg ABIs - R=0.87 suggesting mild dz, L=1.29 possibly falsely elevated due to calcified vessels. . Pleural effusion   a. L pleural eff after CABG s/p thoracentesis 08/22/12. . Prostate cancer [facility])   Status post radiation treatment. . Prostatic hypertrophy   a. Hx of urinary retention, awaiting TURP. . Tobacco abuse  . Valvular heart disease   a. Mild  MR by TEE 08/2012. Past Surgical History: Past Surgical History: Procedure Laterality Date . APPENDECTOMY   . BACK SURGERY   . CORONARY ARTERY BYPASS GRAFT  08/16/2012  Procedure: CORONARY ARTERY BYPASS GRAFTING (CABG);  Surgeon: Melrose Nakayama, MD;  Location: [facility];  Service: Open Heart Surgery;  Laterality: N/A; . 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 He has had a PEG tube and has recieved minimal PO trials since his CVA secondary to mentation and inability to participate/follow commands. MBS was previously attempted during acute hospitalization after CVA and Pt was unable to participate causing study to be aborted.  He has tolerated some trials of ice chips and puree with treating SLP in SNF setting. MBS ordered this date to determine appropriate recommendations, strategies and/or initiate least restrictive PO diet.  No data recorded Assessment / Plan / Recommendation CHL IP CLINICAL IMPRESSIONS 01/25/2019 Clinical Impression Pt presents with moderately severe sensorimotor oropharyngeal dysphagia characterized by decreased oral coordination/awareness, delayed swallowing trigger, decreased laryngeal vestibule closure (LVC), penetration during the swallow (NTL & thin liquids) followed by SILENT aspiration after the swallow. Pt was able to inconsistenty follow commands to produce a hard cough and repeat dry swallows and efforts were effective in decreasing but not in clearing penetrates or visualized aspirates from laryngeal vestibule or from below the cords. With NTL note penetration and suspect eventual aspiration after penetrates mixed with thin residue and secretions. Note volume dependent improvement in epiglotic deflection, duration and strength of pharyngeal squeeze and improved LVC as noted with heavy textures: HTL and puree textures resulting in no penetration or aspiration visualized with these consistencies. As trials progressed Pt with decreased ability to follow commands and increased lethargy. Based on the above presentation today Recommend consider  continue NPO status with initiation of trials of HTL and Puree with SLP only, increasing length and volume of trials over time; since Pt is cognitively unable to participate in laryngeal strengthening exercises, clinically consider dysphagia therapy to include frequent swallows both with consistencies and dry swallows. Recommend repeat MBS when it is clinically indicated as recommended by primary treating SLP.  SLP Visit Diagnosis Dysphagia, oropharyngeal phase (R13.12) Attention and concentration deficit following -- Frontal lobe and executive function deficit following -- Impact on safety and function Moderate aspiration  risk;Severe aspiration risk   CHL IP TREATMENT RECOMMENDATION 01/25/2019 Treatment Recommendations F/U MBS in --- days (Comment)   No flowsheet data found. CHL IP DIET RECOMMENDATION 01/25/2019 SLP Diet Recommendations NPO Liquid Administration via -- Medication Administration Via alternative means Compensations Minimize environmental distractions;Slow rate;Small sips/bites;Follow solids with liquid;Multiple dry swallows after each bite/sip Postural Changes --   CHL IP OTHER RECOMMENDATIONS 01/25/2019 Recommended Consults -- Oral Care Recommendations Oral care QID;Oral care before and after PO Other Recommendations Order thickener from pharmacy   CHL IP FOLLOW UP RECOMMENDATIONS 01/25/2019 Follow up Recommendations Skilled Nursing facility   No flowsheet data found.     CHL IP ORAL PHASE 01/25/2019 Oral Phase Impaired Oral - Pudding Teaspoon -- Oral - Pudding Cup -- Oral - Honey Teaspoon Piecemeal swallowing;Premature spillage;Decreased bolus cohesion Oral - Honey Cup -- Oral - Nectar Teaspoon Decreased bolus cohesion;Premature spillage;Piecemeal swallowing Oral - Nectar Cup Premature spillage;Decreased bolus cohesion;Piecemeal swallowing Oral - Nectar Straw -- Oral - Thin Teaspoon Premature spillage;Decreased bolus cohesion;Delayed oral transit;Piecemeal swallowing;Lingual/palatal residue Oral - Thin  Cup Lingual/palatal residue;Piecemeal swallowing;Delayed oral transit;Decreased bolus cohesion;Premature spillage Oral - Thin Straw -- Oral - Puree Decreased bolus cohesion Oral - Mech Soft -- Oral - Regular -- Oral - Multi-Consistency -- Oral - Pill -- Oral Phase - Comment --  CHL IP PHARYNGEAL PHASE 01/25/2019 Pharyngeal Phase -- Pharyngeal- Pudding Teaspoon -- Pharyngeal -- Pharyngeal- Pudding Cup -- Pharyngeal -- Pharyngeal- Honey Teaspoon -- Pharyngeal -- Pharyngeal- Honey Cup -- Pharyngeal -- Pharyngeal- Nectar Teaspoon -- Pharyngeal Material enters airway, passes BELOW cords without attempt by patient to eject out (silent aspiration) Pharyngeal- Nectar Cup -- Pharyngeal Material enters airway, passes BELOW cords without attempt by patient to eject out (silent aspiration) Pharyngeal- Nectar Straw -- Pharyngeal -- Pharyngeal- Thin Teaspoon -- Pharyngeal Material enters airway, passes BELOW cords without attempt by patient to eject out (silent aspiration) Pharyngeal- Thin Cup -- Pharyngeal Material enters airway, passes BELOW cords without attempt by patient to eject out (silent aspiration) Pharyngeal- Thin Straw -- Pharyngeal -- Pharyngeal- Puree -- Pharyngeal -- Pharyngeal- Mechanical Soft -- Pharyngeal -- Pharyngeal- Regular -- Pharyngeal -- Pharyngeal- Multi-consistency -- Pharyngeal -- Pharyngeal- Pill -- Pharyngeal -- Pharyngeal Comment --  CHL IP CERVICAL ESOPHAGEAL PHASE 01/25/2019 Cervical Esophageal Phase WFL Pudding Teaspoon -- Pudding Cup -- Honey Teaspoon -- Honey Cup -- Nectar Teaspoon -- Nectar Cup -- Nectar Straw -- Thin Teaspoon -- Thin Cup -- Thin Straw -- Puree -- Mechanical Soft -- Regular -- Multi-consistency -- Pill -- Cervical Esophageal Comment -- Amelia H. Roddie Mc, CCC-SLP Speech Language Pathologist Wende Bushy 01/25/2019, 2:48 PM            CLINICAL DATA:  Dysphagia EXAM: MODIFIED BARIUM SWALLOW TECHNIQUE: Different consistencies of barium were administered orally to the  patient by the Speech Pathologist. Imaging of the pharynx was performed in the lateral projection. FLUOROSCOPY TIME:  Fluoroscopy Time:  2 minutes 48 seconds Radiation Exposure Index (if provided by the fluoroscopic device): 8.5 mGy Number of Acquired Spot Images: multiple fluoroscopic screen captures COMPARISON:  None FINDINGS: Thin liquid-spillover of contrast to vallecular and piriform sinuses. Laryngeal penetration without aspiration. Contrast extended onto the vocal cords. Nectar thick liquid-with sips of nectar consistency, laryngeal penetration was identified associated with vallecular residuals. Aspiration was seen on several swallows, which appear to be from residuals from previous swallows rather than from current swallow. Contrast extended into the upper trachea along the anterior wall without spontaneous cough. Honey-no laryngeal penetration or aspiration.  Vallecular  residuals. Pure-vallecular and minimal piriform sinus residuals. No laryngeal penetration or aspiration Cracker-not evaluated Pure with cracker-not evaluated Barium tablet-not evaluated IMPRESSION: Scattered laryngeal penetration of thin and nectar consistency. Aspiration of contrast into the proximal trachea, predominantly from residuals of both thin liquid and nectar consistency from preceding swallows. No spontaneous cough reflux identified. Significant vallecular residuals especially with applesauce and to lesser degree honey and nectar consistencies. Electronically Signed   By: Lavonia Dana M.D.   On: 01/25/2019 12:31    Assessment/Plan Dysphagia D/W the therapy and the wife in the room Patient did better in his Modified swallowing then before. But since he has not ate for this long he has forgotten what to do with the food. He did well with therapy in the room and with the small amount of food. We will continue to give him small amount of Apple Sauce under supervision of his wife. I told his wife not to overfeed him as he is  getting most of his calories from Tube feeding. And he does stay aspiration risk with overfeeding Nontraumatic subcortical hemorrhage of left cerebral hemisphere Severe Neuro Deficits Continue Ativan as needed for agitation Will discontinue Seroquel  D/W therapy. on Baclofen 5 mg BID for increased tone in UE Will increase the dose slowly Patient  making some progress. Continues to be dependent .Prognosis Poor  Neurology Follow up pending IDDM (insulin dependent diabetes mellitus) BS Running more then 250 through ou tthe day But then there are some sugars of 80 in afternoon. His A1C was 7.6 I will not increase his Lantus right now. Will continue with sliding scale  Essential hypertension BPStable Continue to monitor On Norvasc and metroprolol Wound of buttock, Resolved Urinary retention Continue Foley for now. Urology follow up in 3 months Right now they want Catheter continued ESBL Positive UTI Finished the treatment IVomiting and Diarrhea resolved PEG Tube Feeding Continue for now Gastric Ulcer On Protonix Follow with Gastro Hyperlipidemia On Atorvastatin LDL 50       Family/ staff Communication: D/W the Speech therapy, Wound care nurse and wife  Labs/tests ordered:  Total time spent in this patient care encounter was 45_ minutes; greater than 50% of the visit spent counseling patient, reviewing records , Labs and coordinating care for problems addressed at this encounter.

## 2019-02-04 ENCOUNTER — Non-Acute Institutional Stay (SKILLED_NURSING_FACILITY): Payer: Medicare Other | Admitting: Adult Health

## 2019-02-04 ENCOUNTER — Encounter: Payer: Self-pay | Admitting: Adult Health

## 2019-02-04 DIAGNOSIS — I610 Nontraumatic intracerebral hemorrhage in hemisphere, subcortical: Secondary | ICD-10-CM | POA: Diagnosis not present

## 2019-02-04 DIAGNOSIS — F323 Major depressive disorder, single episode, severe with psychotic features: Secondary | ICD-10-CM | POA: Diagnosis not present

## 2019-02-04 DIAGNOSIS — I619 Nontraumatic intracerebral hemorrhage, unspecified: Secondary | ICD-10-CM

## 2019-02-04 DIAGNOSIS — I2510 Atherosclerotic heart disease of native coronary artery without angina pectoris: Secondary | ICD-10-CM | POA: Diagnosis not present

## 2019-02-04 NOTE — Progress Notes (Signed)
Location:   [place] Room Number: 130 P Place of Service:  SNF (31)   CODE STATUS: Full Code  No Known Allergies  Chief Complaint  Patient presents with  . 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 Weekly follow up for the first 30 days post post hospitalization     HPI:  He is a 73 year old short term resident of this facility being seen for the management of his chronic illnesses: subcortical hemorrhage; hemorrhagic stroke; cad; major depression. There are no reports of aspiration; no uncontrolled pain; no indications of agitation or anxiety present. There are no reports of fevers present.   Past Medical History:  Diagnosis Date  . A-fib ([place])    a. Post-op afib after CABG 08/2012.  [name] CAD (coronary artery disease)    a. NSTEMI s/p stent to distal RCA 03/2000. b. Inf MI s/p emergent thrombectomy/stenting mid RCA 10/2000. c. NSTEMI s/p CABGx4 (LIMA-LAD, SVG-OM2, seq SVG-acute marginal and PD) 08/14/25 - course complicated by confusion, post-op AF, L pleural effusion with thoracentesis. d. Normal LV function by echo 08/2012.  . Diabetes mellitus   . GERD (gastroesophageal reflux disease)   . Hiatal hernia   . HLD (hyperlipidemia)   . HTN (hypertension)   . Peripheral vascular disease ([place])    a. Carotid dopplers neg 08/13/12. b. Pre-cabg ABIs - R=0.87 suggesting mild dz, L=1.29 possibly falsely elevated due to calcified vessels.  . Pleural effusion    a. L pleural eff after CABG s/p thoracentesis 08/22/12.  . Prostate cancer [facility])    Status post radiation treatment.  . Prostatic hypertrophy    a. Hx of urinary retention, awaiting TURP.  . Tobacco abuse   . Valvular heart disease    a. Mild  MR by TEE 08/2012.     Past Surgical History:  Procedure Laterality Date  . APPENDECTOMY    . BACK SURGERY    . CORONARY ARTERY BYPASS GRAFT  08/16/2012   Procedure: CORONARY ARTERY BYPASS GRAFTING (CABG);  Surgeon: Melrose Nakayama, MD;  Location: [place];  Service: Open Heart Surgery;  Laterality: N/A;  . LEFT HEART CATHETERIZATION WITH CORONARY ANGIOGRAM N/A 08/14/2012   Procedure: LEFT HEART CATHETERIZATION WITH CORONARY ANGIOGRAM;  Surgeon: Peter M Martinique, MD;  Location: [facility] CATH LAB;  Service: Cardiovascular;  Laterality: N/A;    Social History   Socioeconomic History  . Marital status: Married    Spouse name: Not on file  . Number of children: Not on file  . Years of education: Not on file  . Highest education level: Not on file  Occupational History  . Not on file  Social Needs  . Financial resource strain: Not on file  . Food insecurity:    Worry: Not on file    Inability: Not on file  . Transportation needs:    Medical: Not on file    Non-medical: Not on file  Tobacco Use  . Smoking status: Former Smoker    Last attempt to quit: 11/12/1972    Years since quitting: 46.2  . Smokeless tobacco: Never Used  Substance and Sexual Activity  . Alcohol use: No  . Drug use: No  . Sexual activity: Never  Lifestyle  . Physical activity:    Days per week: Not on file  Minutes per session: Not on file  . Stress: Not on file  Relationships  . Social connections:    Talks on phone: Not on file    Gets together: Not on file    Attends religious service: Not on file    Active member of club or organization: Not on file    Attends meetings of clubs or organizations: Not on file    Relationship status: Not on file  . Intimate partner violence:    Fear of current or ex partner: Not on file    Emotionally abused: Not on file    Physically abused: Not on file    Forced sexual activity: Not on file  Other Topics Concern  . Not on file  Social History Narrative  . Not on file   History  reviewed. No pertinent family history.    VITAL SIGNS BP (!) 152/88   Pulse 64   Temp (!) 97.5 F (36.4 C)   Resp (!) 24   Ht 5\' 11"  (1.803 m)   Wt 164 lb 3.2 oz (74.5 kg)   BMI 22.90 kg/m   Outpatient Encounter Medications as of 02/04/2019  Medication Sig  . Amino Acids-Protein Hydrolys (FEEDING SUPPLEMENT, PRO-STAT SUGAR FREE 64,) LIQD Place 30 mLs into feeding tube 3 (three) times daily.   [name] amLODipine (NORVASC) 5 MG tablet Place 5 mg into feeding tube daily.  [name] atorvastatin (LIPITOR) 40 MG tablet Place 40 mg into feeding tube at bedtime.   . Baclofen 5 MG TABS 1 tablet by Gastric Tube route 2 (two) times daily.  . Dimethicone 1.3 % LOTN Apply 1 application topically See admin instructions. Apply to the extremities and back daily and as needed for itching /dry skin. Do not apply to buttocks wound.  . diphenoxylate-atropine (LOMOTIL) 2.5-0.025 MG tablet Place 2 tablets into feeding tube every 4 (four) hours as needed for diarrhea or loose stools.  [name] FLUoxetine (PROZAC) 20 MG/5ML solution Place 20 mg into feeding tube daily.   [name] GLUCERNA ([place]) LIQD Give 1.5  @@ 70cc per hour continuously via gastric tube.  Run for 20 hours each day.  Hold TF 8 am - 12 pm  . hydrocortisone cream (PREPARATION H) 1 % Apply rectally twice daily and as needed for hemorrhoids  . insulin aspart (NOVOLOG FLEXPEN) 100 UNIT/ML FlexPen Give subcutaneous per sliding scale every 6 hours; If blood sugars are less than 60, CALL MD 200-250= 4 units 251-300= 6 units 301-351= 8 units 351-400= 10 units If Blood sugar is greater than 400, call MD If blood sugars are greater than 351, Give 0 units, CALL MD  . Insulin Glargine (LANTUS SOLOSTAR) 100 UNIT/ML Solostar Pen Inject 30 Units into the skin daily.   . lansoprazole (PREVACID SOLUTAB) 30 MG disintegrating tablet Place 30 mg into feeding tube 2 (two) times daily.  [name] LORazepam (ATIVAN) 0.5 MG tablet Place 0.5 mg into feeding tube every 6 (six) hours as needed  for anxiety.  . Melatonin 1 MG TABS 2 tablets by Gastric Tube route at bedtime.   . metoprolol tartrate (LOPRESSOR) 25 MG tablet Place 12.5 mg into feeding tube 2 (two) times daily.  . NON FORMULARY Diet Type:   NPO  . oseltamivir (TAMIFLU) 75 MG capsule Take 75 mg by mouth at bedtime.  . Water For Irrigation, Sterile (STERILE WATER FOR IRRIGATION) Flush with 250 cc every 6 hours  . Wound Dressings (HYDROGEL EX) Apply hydrogel moistened gauze and cover with coverderm dressing.  Change daily  and as needed  . [DISCONTINUED] LORazepam (ATIVAN) 0.5 MG tablet Take one tablet po Q six hours prn anxiety x 14 days (Patient not taking: Reported on 02/04/2019)  . [DISCONTINUED] metoprolol tartrate (LOPRESSOR) 25 MG tablet Take 0.5 tablets (12.5 mg total) by mouth 2 (two) times daily. (Patient not taking: Reported on 02/04/2019)  . [DISCONTINUED] QUEtiapine (SEROQUEL) 25 MG tablet Take 12.5 mg by mouth at bedtime.    No facility-administered encounter medications on file as of 02/04/2019.      SIGNIFICANT DIAGNOSTIC EXAMS  LABS REVIEWED PREVIOUS:   01-04-19: urine culture: klebsiella oxytoca  01-16-19: wbc 10.1; hgb 13.9; hct 45.2; mcv 96.2; plt 287; glucose 371; bun 44; creat 1.10; k+ 4.7; na++137; ca 8.7 01-28-19: wbc 8.1; hgb 14.9; hct 48.4; mcv 98.;4 plt 282; glucose 285; bun 38; creat 1.17; k+ 4.3 na++ 143 ca 9.3  01-29-19: glucose 263; bun 39; creat 1.17;  k+ 4.0; na++ 139; ca 9.0; ast 52; alt 59  albumin 2.7 hgb a1c 7.6; chol 116; ldl 50; trig 236; hdl 19   NO NEW LABS.    Review of Systems  Reason unable to perform ROS: nonverbal     Physical Exam Constitutional:      General: He is not in acute distress.    Appearance: He is well-developed. He is not diaphoretic.  Neck:     Musculoskeletal: Neck supple.     Thyroid: No thyromegaly.  Cardiovascular:     Rate and Rhythm: Normal rate and regular rhythm.     Heart sounds: Normal heart sounds.     Comments: Pedal pulses faint  Pulmonary:      Effort: Pulmonary effort is normal. No respiratory distress.     Breath sounds: Normal breath sounds.  Abdominal:     General: Bowel sounds are normal. There is no distension.     Palpations: Abdomen is soft.     Tenderness: There is no abdominal tenderness.     Comments: Peg tube present   Genitourinary:    Comments: Foley present  Musculoskeletal:     Right lower leg: No edema.     Left lower leg: No edema.     Comments: Right side hemiparesis Able to move left extremities     Lymphadenopathy:     Cervical: No cervical adenopathy.  Skin:    General: Skin is warm and dry.  Neurological:     Mental Status: He is alert.     Comments: Is aware   Psychiatric:        Mood and Affect: Mood normal.      ASSESSMENT/ PLAN:  TODAY:   1. Nontraumatic subcortical hemorrhage of left cerebral hemisphere/ hemorrhagic cerebrovascular accident  is without change: will continue baclofen 5 mg twice daily for spasticity   2. Major depression with psychotic features: is stable will continue prozac 20 mg daily; seroquel 12.5 mg nightly has ativan 0.5 mg every 6 hours as needed for total 14 days.   3. CAD: native artery; native heart without angina: is status post CABG: 2013: is stable will monitor   PREVIOUS:   4. Hypertension associated with diabetes: is stable b/p 152/88; will continue norvasc 5 mg daily and lopressor 12.5 mg twice daily   5. One episode of  a-fib: heart rate (post surgery)  is stable: will continue lopressor 12.5 mg twice daily   6. Dyslipidemia associated with type 2 diabetes mellitus: is stable will continue lipitor 80 mg daily   7. Dysphagia oropharyngeal phase: no  signs of aspiration: is peg tube dependent is on glucerna 70 cc per hour for 20 hour daily   8. Protein calorie malnutrition; severe: is without change weight is stable 162 pounds albumin 2.7 will continue prostat 30 cc three times daily   9. Dyslipidemia associated with type 2 diabetes mellitus: is  stable ldl 50  will continue lipitor  40 mg daily  10. Type 2 diabetes mellitus with peripheral vascular disease: is stable hgb a1c 7.6 will continue lantus 30 units nightly and will continue novolog SSI: 200-250: 4 units; 251-300: 6 units; 301-350: 8 units; 351-400: 10 units     MD is aware of resident's narcotic use and is in agreement with current plan of care. We will attempt to wean resident as apropriate   Ok Edwards NP [facility]  Contact [phone] Monday through Friday 8am- 5pm  After hours call [phone]

## 2019-02-09 DIAGNOSIS — I619 Nontraumatic intracerebral hemorrhage, unspecified: Secondary | ICD-10-CM | POA: Insufficient documentation

## 2019-02-11 ENCOUNTER — Other Ambulatory Visit: Payer: Self-pay

## 2019-02-11 ENCOUNTER — Emergency Department (HOSPITAL_COMMUNITY): Payer: Medicare Other

## 2019-02-11 ENCOUNTER — Non-Acute Institutional Stay (SKILLED_NURSING_FACILITY): Payer: Medicare Other | Admitting: Adult Health

## 2019-02-11 ENCOUNTER — Encounter: Payer: Self-pay | Admitting: Adult Health

## 2019-02-11 ENCOUNTER — Emergency Department (HOSPITAL_COMMUNITY)
Admission: EM | Admit: 2019-02-11 | Discharge: 2019-02-11 | Disposition: A | Discharge status: Skilled Nursing Facility | Payer: Medicare Other | Attending: Emergency Medicine | Admitting: Emergency Medicine

## 2019-02-11 DIAGNOSIS — Z79899 Other long term (current) drug therapy: Secondary | ICD-10-CM | POA: Insufficient documentation

## 2019-02-11 DIAGNOSIS — F323 Major depressive disorder, single episode, severe with psychotic features: Secondary | ICD-10-CM

## 2019-02-11 DIAGNOSIS — I10 Essential (primary) hypertension: Secondary | ICD-10-CM | POA: Diagnosis not present

## 2019-02-11 DIAGNOSIS — E1151 Type 2 diabetes mellitus with diabetic peripheral angiopathy without gangrene: Secondary | ICD-10-CM | POA: Diagnosis not present

## 2019-02-11 DIAGNOSIS — Z431 Encounter for attention to gastrostomy: Secondary | ICD-10-CM | POA: Insufficient documentation

## 2019-02-11 DIAGNOSIS — Z794 Long term (current) use of insulin: Secondary | ICD-10-CM | POA: Insufficient documentation

## 2019-02-11 DIAGNOSIS — K9423 Gastrostomy malfunction: Secondary | ICD-10-CM

## 2019-02-11 DIAGNOSIS — I610 Nontraumatic intracerebral hemorrhage in hemisphere, subcortical: Secondary | ICD-10-CM | POA: Diagnosis not present

## 2019-02-11 DIAGNOSIS — Z931 Gastrostomy status: Secondary | ICD-10-CM

## 2019-02-11 MED ORDER — IOPAMIDOL (ISOVUE-300) INJECTION 61%
INTRAVENOUS | Status: AC
  Filled 2019-02-11: qty 50

## 2019-02-11 MED ORDER — IOHEXOL 300 MG/ML  SOLN
30.0000 mL | Freq: Once | INTRAMUSCULAR | Status: AC | PRN
  Administered 2019-02-11: 30 mL

## 2019-02-11 NOTE — ED Notes (Signed)
Abd binder applied

## 2019-02-11 NOTE — Progress Notes (Signed)
Location:   [place] Room Number: 130 P Place of Service:  SNF (31)   CODE STATUS: Full Code  No Known Allergies  Chief Complaint  Patient presents with  . Acute Visit    Change in Status    HPI:  His wife is concerned that he has had a decline in his status. He is more restless since coming off his seroquel. He has not used his prn ativan. He has been verbal intermittently. We have spoken with his wife regarding his status and the expectations of him to have good and bad periods of time. She did verbalize understanding. There are no reports of uncontrolled pain; no reports of fevers present.    Past Medical History:  Diagnosis Date  . A-fib ([place])    a. Post-op afib after CABG 08/2012.  [name] CAD (coronary artery disease)    a. NSTEMI s/p stent to distal RCA 03/2000. b. Inf MI s/p emergent thrombectomy/stenting mid RCA 10/2000. c. NSTEMI s/p CABGx4 (LIMA-LAD, SVG-OM2, seq SVG-acute marginal and PD) 01/16/35 - course complicated by confusion, post-op AF, L pleural effusion with thoracentesis. d. Normal LV function by echo 08/2012.  . Diabetes mellitus   . GERD (gastroesophageal reflux disease)   . Hiatal hernia   . HLD (hyperlipidemia)   . HTN (hypertension)   . Peripheral vascular disease ([place])    a. Carotid dopplers neg 08/13/12. b. Pre-cabg ABIs - R=0.87 suggesting mild dz, L=1.29 possibly falsely elevated due to calcified vessels.  . Pleural effusion    a. L pleural eff after CABG s/p thoracentesis 08/22/12.  . Prostate cancer [facility])    Status post radiation treatment.  . Prostatic hypertrophy    a. Hx of urinary retention, awaiting TURP.  . Tobacco abuse   . Valvular heart disease    a. Mild  MR by TEE 08/2012.    Past Surgical History:  Procedure Laterality Date  . APPENDECTOMY    . BACK SURGERY    . CORONARY ARTERY BYPASS GRAFT  08/16/2012   Procedure: CORONARY ARTERY BYPASS GRAFTING (CABG);  Surgeon: Melrose Nakayama, MD;  Location: [place];   Service: Open Heart Surgery;  Laterality: N/A;  . LEFT HEART CATHETERIZATION WITH CORONARY ANGIOGRAM N/A 08/14/2012   Procedure: LEFT HEART CATHETERIZATION WITH CORONARY ANGIOGRAM;  Surgeon: Peter M Martinique, MD;  Location: [facility] CATH LAB;  Service: Cardiovascular;  Laterality: N/A;    Social History   Socioeconomic History  . Marital status: Married    Spouse name: Not on file  . Number of children: Not on file  . Years of education: Not on file  . Highest education level: Not on file  Occupational History  . Not on file  Social Needs  . Financial resource strain: Not on file  . Food insecurity:    Worry: Not on file    Inability: Not on file  . Transportation needs:    Medical: Not on file    Non-medical: Not on file  Tobacco Use  . Smoking status: Former Smoker    Last attempt to quit: 11/12/1972    Years since quitting: 46.2  . Smokeless tobacco: Never Used  Substance and Sexual Activity  . Alcohol use: No  . Drug use: No  . Sexual activity: Never  Lifestyle  . Physical activity:    Days per week: Not on file    Minutes per session: Not on file  . Stress: Not on file  Relationships  . Social connections:  Talks on phone: Not on file    Gets together: Not on file    Attends religious service: Not on file    Active member of club or organization: Not on file    Attends meetings of clubs or organizations: Not on file    Relationship status: Not on file  . Intimate partner violence:    Fear of current or ex partner: Not on file    Emotionally abused: Not on file    Physically abused: Not on file    Forced sexual activity: Not on file  Other Topics Concern  . Not on file  Social History Narrative  . Not on file   History reviewed. No pertinent family history.    VITAL SIGNS BP 131/80   Pulse 71   Temp (!) 97.3 F (36.3 C)   Resp (!) 22   Ht 5\' 11"  (1.803 m)   Wt 163 lb 9.6 oz (74.2 kg)   BMI 22.82 kg/m   Outpatient Encounter Medications as of 02/11/2019    Medication Sig  . Amino Acids-Protein Hydrolys (FEEDING SUPPLEMENT, PRO-STAT SUGAR FREE 64,) LIQD Place 30 mLs into feeding tube 3 (three) times daily.   [name] amLODipine (NORVASC) 5 MG tablet Place 5 mg into feeding tube daily.  [name] atorvastatin (LIPITOR) 40 MG tablet Place 40 mg into feeding tube at bedtime.   . baclofen (LIORESAL) 10 MG tablet 1 tablet by Gastric Tube route 2 (two) times daily.   . Dimethicone 1.3 % LOTN Apply 1 application topically See admin instructions. Apply to the extremities and back daily and as needed for itching /dry skin. Do not apply to buttocks wound.  . diphenoxylate-atropine (LOMOTIL) 2.5-0.025 MG tablet Place 2 tablets into feeding tube every 4 (four) hours as needed for diarrhea or loose stools.  [name] FLUoxetine (PROZAC) 20 MG/5ML solution Place 20 mg into feeding tube daily.   [name] GLUCERNA ([place]) LIQD Give 1.5  @@ 70cc per hour continuously via gastric tube.  Run for 20 hours each day.  Hold TF 8 am - 12 pm   Turn Tube feeding off every day at 0800 per dietician orders  . hydrocortisone cream (PREPARATION H) 1 % Apply rectally twice daily and as needed for hemorrhoids  . insulin aspart (NOVOLOG FLEXPEN) 100 UNIT/ML FlexPen Give subcutaneous per sliding scale every 6 hours; If blood sugars are less than 60, CALL MD 200-250= 4 units 251-300= 6 units 301-351= 8 units 351-400= 10 units If Blood sugar is greater than 400, call MD  . Insulin Glargine (LANTUS SOLOSTAR) 100 UNIT/ML Solostar Pen Inject 30 Units into the skin daily.   . lansoprazole (PREVACID SOLUTAB) 30 MG disintegrating tablet Place 30 mg into feeding tube 2 (two) times daily.  [name] LORazepam (ATIVAN) 0.5 MG tablet Place 0.5 mg into feeding tube every 6 (six) hours as needed for anxiety.  . Melatonin 1 MG TABS 2 tablets by Gastric Tube route at bedtime.   . metoprolol tartrate (LOPRESSOR) 25 MG tablet Place 12.5 mg into feeding tube 2 (two) times daily.  . NON FORMULARY Diet Type:   NPO  . oseltamivir  (TAMIFLU) 75 MG capsule Place 75 mg into feeding tube at bedtime.   . Water For Irrigation, Sterile (STERILE WATER FOR IRRIGATION) Flush with 250 cc every 6 hours  . Wound Dressings (HYDROGEL EX) Apply hydrogel moistened gauze and cover with coverderm dressing.  Change daily and as needed   No facility-administered encounter medications on file as of 02/11/2019.  SIGNIFICANT DIAGNOSTIC EXAMS  LABS REVIEWED PREVIOUS:   01-04-19: urine culture: klebsiella oxytoca  01-16-19: wbc 10.1; hgb 13.9; hct 45.2; mcv 96.2; plt 287; glucose 371; bun 44; creat 1.10; k+ 4.7; na++137; ca 8.7 01-28-19: wbc 8.1; hgb 14.9; hct 48.4; mcv 98.;4 plt 282; glucose 285; bun 38; creat 1.17; k+ 4.3 na++ 143 ca 9.3  01-29-19: glucose 263; bun 39; creat 1.17;  k+ 4.0; na++ 139; ca 9.0; ast 52; alt 59  albumin 2.7 hgb a1c 7.6; chol 116; ldl 50; trig 236; hdl 19   NO NEW LABS.   Review of Systems  Reason unable to perform ROS: nonverbal      Physical Exam Constitutional:      General: He is not in acute distress.    Appearance: He is well-developed. He is not diaphoretic.  Neck:     Musculoskeletal: Neck supple.     Thyroid: No thyromegaly.  Cardiovascular:     Rate and Rhythm: Normal rate and regular rhythm.     Heart sounds: Normal heart sounds.     Comments: Pedal pulses faint  Pulmonary:     Effort: Pulmonary effort is normal. No respiratory distress.     Breath sounds: Normal breath sounds.  Abdominal:     General: Bowel sounds are normal. There is no distension.     Palpations: Abdomen is soft.     Tenderness: There is no abdominal tenderness.     Comments: Peg tube present   Genitourinary:    Comments: Foley  Musculoskeletal:     Right lower leg: No edema.     Left lower leg: No edema.     Comments:  Right side hemiparesis Able to move left extremities      Lymphadenopathy:     Cervical: No cervical adenopathy.  Skin:    General: Skin is warm and dry.  Neurological:     Mental Status: He  is alert. Mental status is at baseline.      ASSESSMENT/ PLAN:  TODAY:   1. Nontraumatic subcortical hemorrhage of left cerebral hemisphere/ hemorrhagic cerebrovascular accident  is without change: will continue baclofen 5 mg twice daily for spasticity   2. Major depression with psychotic features: is worse: will continue prozac 20 mg daily will restart seroquel 12.5 mg daily and will stop ativan at this time due to nonuse.   MD is aware of resident's narcotic use and is in agreement with current plan of care. We will attempt to wean resident as apropriate   Ok Edwards NP [facility]  Contact [phone] Monday through Friday 8am- 5pm  After hours call [phone]

## 2019-02-11 NOTE — ED Notes (Addendum)
Have paged [facility] to try to find an 60F and 73F Peg tube.

## 2019-02-11 NOTE — ED Triage Notes (Signed)
Pt sent from [facility] for pulling out g tube

## 2019-02-11 NOTE — Discharge Instructions (Signed)
We have replaced Mr. Eula Flax months PEG tube. Unfortunately we were unable to get a 20 [place] PEG tube like he had.  He at this point has a 16 [place] PEG tube and the placement has been confirmed for use.  If you are having difficulty with the feeds and need a 20 [place] feeding tube then you will need the provider qt the facility to contact Surgical Service or Interventional Radiology service at [place] or [facility] to facilitate.

## 2019-02-11 NOTE — ED Provider Notes (Signed)
[facility] EMERGENCY DEPARTMENT Provider Note   CSN: [id number] Arrival date & time: 02/11/19  1654    History   Chief Complaint Chief Complaint  Patient presents with  . g tube pulled out    HPI [name] is a 73 y.o. male.     HPI Level 5 caveat for altered mental status and difficulty to communicate post [place] stroke.  73 year old male with history of diabetes, GERD, hypertension who recently suffered a hemorrhagic stroke and has a PEG tube in place comes in with chief complaint of PEG tube dislodgment.  According to patient's wife, patient has been unhappy with the PEG tube ever since it was placed and he removed it earlier today.  Patient is not able to communicate properly after his hemorrhagic stroke.  Past Medical History:  Diagnosis Date  . A-fib ([place])    a. Post-op afib after CABG 08/2012.  [name] CAD (coronary artery disease)    a. NSTEMI s/p stent to distal RCA 03/2000. b. Inf MI s/p emergent thrombectomy/stenting mid RCA 10/2000. c. NSTEMI s/p CABGx4 (LIMA-LAD, SVG-OM2, seq SVG-acute marginal and PD) 05/13/12 - course complicated by confusion, post-op AF, L pleural effusion with thoracentesis. d. Normal LV function by echo 08/2012.  . Diabetes mellitus   . GERD (gastroesophageal reflux disease)   . Hiatal hernia   . HLD (hyperlipidemia)   . HTN (hypertension)   . Peripheral vascular disease ([place])    a. Carotid dopplers neg 08/13/12. b. Pre-cabg ABIs - R=0.87 suggesting mild dz, L=1.29 possibly falsely elevated due to calcified vessels.  . Pleural effusion    a. L pleural eff after CABG s/p thoracentesis 08/22/12.  . Prostate cancer [facility])    Status post radiation treatment.  . Prostatic hypertrophy    a. Hx of urinary retention, awaiting TURP.  . Tobacco abuse   . Valvular heart disease    a. Mild  MR by TEE 08/2012.    Patient Active Problem List   Diagnosis Date Noted  . Hemorrhagic cerebrovascular accident (CVA) ([place]) 02/09/2019  . Hypertension associated  with diabetes ([place]) 01/28/2019  . Dyslipidemia associated with type 2 diabetes mellitus ([place]) 01/28/2019  . Dysphagia, oropharyngeal phase 01/28/2019  . Type 2 diabetes mellitus with peripheral vascular disease ([place]) 01/28/2019  . Major depression with psychotic features ([place]) 01/28/2019  . Protein-calorie malnutrition, severe 01/08/2019  . UTI (urinary tract infection) 01/06/2019  . Pressure injury of skin 01/05/2019  . Acute UTI 01/04/2019  . Proctitis 01/04/2019  . History of hemorrhagic cerebrovascular accident (CVA) with residual deficit 01/04/2019  . Diarrhea 01/03/2019  . Urinary retention 12/24/2018  . IVH (intraventricular hemorrhage) ([place]) 11/18/2018  . Nontraumatic subcortical hemorrhage of left cerebral hemisphere ([place]) 11/18/2018  . CAD (coronary artery disease)   . Peripheral vascular disease ([place])   . A-fib One Episode after CABG in 2013   . Prostatic hypertrophy   . NSTEMI (non-ST elevated myocardial infarction) ([place]) 08/15/2012  . IDDM (insulin dependent diabetes mellitus) ([place]) 08/15/2012  . HTN (hypertension) 08/15/2012  . Hyperlipidemia 08/15/2012    Past Surgical History:  Procedure Laterality Date  . APPENDECTOMY    . BACK SURGERY    . CORONARY ARTERY BYPASS GRAFT  08/16/2012   Procedure: CORONARY ARTERY BYPASS GRAFTING (CABG);  Surgeon: Melrose Nakayama, MD;  Location: [place];  Service: Open Heart Surgery;  Laterality: N/A;  . LEFT HEART CATHETERIZATION WITH CORONARY ANGIOGRAM N/A 08/14/2012   Procedure: LEFT HEART CATHETERIZATION WITH CORONARY ANGIOGRAM;  Surgeon: Ander Slade  Martinique, MD;  Location: [facility] CATH LAB;  Service: Cardiovascular;  Laterality: N/A;        Home Medications    Prior to Admission medications   Medication Sig Start Date End Date Taking? Authorizing Provider  Amino Acids-Protein Hydrolys (FEEDING SUPPLEMENT, PRO-STAT SUGAR FREE 64,) LIQD Place 30 mLs into feeding tube 3 (three) times daily.  01/29/19   [provider]  amLODipine  (NORVASC) 5 MG tablet Place 5 mg into feeding tube daily.    [provider]  atorvastatin (LIPITOR) 40 MG tablet Place 40 mg into feeding tube at bedtime.  01/29/19   [provider]  baclofen (LIORESAL) 10 MG tablet 1 tablet by Gastric Tube route 2 (two) times daily.  02/04/19   [provider]  Dimethicone 1.3 % LOTN Apply 1 application topically See admin instructions. Apply to the extremities and back daily and as needed for itching /dry skin. Do not apply to buttocks wound.    [provider]  diphenoxylate-atropine (LOMOTIL) 2.5-0.025 MG tablet Place 2 tablets into feeding tube every 4 (four) hours as needed for diarrhea or loose stools.    [provider]  FLUoxetine (PROZAC) 20 MG/5ML solution Place 20 mg into feeding tube daily.     [provider]  GLUCERNA ([place]) LIQD Give 1.5  @@ 70cc per hour continuously via gastric tube.  Run for 20 hours each day.  Hold TF 8 am - 12 pm   Turn Tube feeding off every day at 0800 per dietician orders 01/22/19   [provider]  hydrocortisone cream (PREPARATION H) 1 % Apply rectally twice daily and as needed for hemorrhoids 01/12/19   [provider]  insulin aspart (NOVOLOG FLEXPEN) 100 UNIT/ML FlexPen Give subcutaneous per sliding scale every 6 hours; If blood sugars are less than 60, CALL MD 200-250= 4 units 251-300= 6 units 301-351= 8 units 351-400= 10 units If Blood sugar is greater than 400, call MD 01/13/19   [provider]  Insulin Glargine (LANTUS SOLOSTAR) 100 UNIT/ML Solostar Pen Inject 30 Units into the skin daily.  01/14/19   [provider]  lansoprazole (PREVACID SOLUTAB) 30 MG disintegrating tablet Place 30 mg into feeding tube 2 (two) times daily. 01/30/19   [provider]  LORazepam (ATIVAN) 0.5 MG tablet Place 0.5 mg into feeding tube every 6 (six) hours as needed for anxiety. 01/29/19 02/11/19  [provider]  Melatonin 1 MG TABS  2 tablets by Gastric Tube route at bedtime.  01/30/19   [provider]  metoprolol tartrate (LOPRESSOR) 25 MG tablet Place 12.5 mg into feeding tube 2 (two) times daily. 01/09/19   [provider]  NON FORMULARY Diet Type:   NPO    [provider]  oseltamivir (TAMIFLU) 75 MG capsule Place 75 mg into feeding tube at bedtime.  02/04/19 02/18/19  [provider]  Water For Irrigation, Sterile (STERILE WATER FOR IRRIGATION) Flush with 250 cc every 6 hours 01/21/19   [provider]  Wound Dressings (HYDROGEL EX) Apply hydrogel moistened gauze and cover with coverderm dressing.  Change daily and as needed 01/18/19   [provider]    Family History No family history on file.  Social History Social History   Tobacco Use  . Smoking status: Former Smoker    Last attempt to quit: 11/12/1972    Years since quitting: 46.2  . Smokeless tobacco: Never Used  Substance Use Topics  . Alcohol use: No  . Drug  use: No     Allergies   Patient has no known allergies.   Review of Systems Review of Systems  Unable to perform ROS: Mental status change     Physical Exam Updated Vital Signs There were no vitals taken for this visit.  Physical Exam Vitals signs and nursing note reviewed.  Constitutional:      Appearance: He is well-developed.  HENT:     Head: Atraumatic.  Neck:     Musculoskeletal: Neck supple.  Cardiovascular:     Rate and Rhythm: Normal rate.  Pulmonary:     Effort: Pulmonary effort is normal.  Abdominal:     Tenderness: There is no abdominal tenderness.  Skin:    General: Skin is warm.  Neurological:     Mental Status: He is alert and oriented to person, place, and time.      ED Treatments / Results  Labs (all labs ordered are listed, but only abnormal results are displayed) Labs Reviewed - No data to display  EKG None  Radiology Dg Abdomen Peg Tube Location  Result Date: 02/11/2019 CLINICAL DATA:  Peg  tube placement. EXAM: ABDOMEN - 1 VIEW COMPARISON:  CT abdomen and pelvis 01/04/2019 FINDINGS: 15 mL Omnipaque 300 and 15 mL of water was administered via the existing PEG tube. Contrast is seen within the stomach and proximal duodenum. There is no leak. IMPRESSION: Peg tube placement confirmed within the stomach. Electronically Signed   By: San Morelle M.D.   On: 02/11/2019 19:39    Procedures Gastrostomy tube replacement Date/Time: 02/11/2019 8:20 PM Performed by: Varney Biles, MD Authorized by: Varney Biles, MD  Consent: Verbal consent obtained. Risks and benefits: risks, benefits and alternatives were discussed Consent given by: spouse Patient understanding: patient states understanding of the procedure being performed Imaging studies: imaging studies available Patient identity confirmed: arm band Time out: Immediately prior to procedure a "time out" was called to verify the correct patient, procedure, equipment, support staff and site/side marked as required. Preparation: Patient was prepped and draped in the usual sterile fashion. Local anesthesia used: no  Anesthesia: Local anesthesia used: no  Sedation: Patient sedated: no  Patient tolerance: Patient tolerated the procedure well with no immediate complications Comments: We were unsuccessful trying to place 62 [place] and 20 [place] PEG tube.  We were able to successfully place a 16 French PEG tube.  No bleeding.    (including critical care time)  Medications Ordered in ED Medications  iohexol (OMNIPAQUE) 300 MG/ML solution 30 mL (30 mLs Other Contrast Given 02/11/19 1930)     Initial Impression / Assessment and Plan / ED Course  I have reviewed the triage vital signs and the nursing notes.  Pertinent labs & imaging results that were available during my care of the patient were reviewed by me and considered in my medical decision making (see chart for details).        73 year old comes in a chief complaint  of PEG tube dislodgment.  PEG tube placed after he had a hemorrhagic stroke. Originally he had a 20 [place] PEG tube.  We were unable to pass a 20 French PEG at bedside.  Eventually a 61 French PEG tube did pass.  Patient tolerated the procedure well.  X-ray confirms the placement.  Patient will be discharged.  Final Clinical Impressions(s) / ED Diagnoses   Final diagnoses:  PEG tube malfunction ([place])  Status post insertion of percutaneous endoscopic gastrostomy (PEG) tube [facility])    ED Discharge Orders  None       Varney Biles, MD 02/11/19 2021

## 2019-02-11 NOTE — ED Notes (Signed)
Report given to [facility], [facility].

## 2019-02-11 NOTE — ED Notes (Signed)
Have put Peg tubes at bedside

## 2019-02-12 ENCOUNTER — Other Ambulatory Visit: Payer: Self-pay | Admitting: Adult Health

## 2019-02-12 ENCOUNTER — Other Ambulatory Visit: Payer: Self-pay

## 2019-02-12 ENCOUNTER — Emergency Department (HOSPITAL_COMMUNITY): Payer: Medicare Other

## 2019-02-12 ENCOUNTER — Inpatient Hospital Stay
Admission: RE | Admit: 2019-02-12 | Discharge: 2021-05-12 | Disposition: A | Discharge status: Other Acute Inpatient Hospital | Payer: Medicare Other | Source: Ambulatory Visit | Attending: Internal Medicine | Admitting: Internal Medicine

## 2019-02-12 ENCOUNTER — Non-Acute Institutional Stay (SKILLED_NURSING_FACILITY): Payer: Medicare Other | Admitting: Adult Health

## 2019-02-12 ENCOUNTER — Emergency Department (HOSPITAL_COMMUNITY)
Admission: EM | Admit: 2019-02-12 | Discharge: 2019-02-12 | Disposition: A | Discharge status: Skilled Nursing Facility | Payer: Medicare Other | Source: Home / Self Care | Attending: Emergency Medicine | Admitting: Emergency Medicine

## 2019-02-12 ENCOUNTER — Encounter: Payer: Self-pay | Admitting: Adult Health

## 2019-02-12 ENCOUNTER — Encounter (HOSPITAL_COMMUNITY): Payer: Self-pay

## 2019-02-12 ENCOUNTER — Emergency Department (HOSPITAL_COMMUNITY)
Admission: EM | Admit: 2019-02-12 | Discharge: 2019-02-12 | Disposition: A | Discharge status: Home / Self Care | Payer: Medicare Other | Attending: Emergency Medicine | Admitting: Emergency Medicine

## 2019-02-12 ENCOUNTER — Encounter (HOSPITAL_COMMUNITY): Payer: Self-pay | Admitting: *Deleted

## 2019-02-12 DIAGNOSIS — E119 Type 2 diabetes mellitus without complications: Secondary | ICD-10-CM | POA: Insufficient documentation

## 2019-02-12 DIAGNOSIS — Z87891 Personal history of nicotine dependence: Secondary | ICD-10-CM | POA: Insufficient documentation

## 2019-02-12 DIAGNOSIS — F411 Generalized anxiety disorder: Secondary | ICD-10-CM | POA: Diagnosis not present

## 2019-02-12 DIAGNOSIS — I10 Essential (primary) hypertension: Secondary | ICD-10-CM

## 2019-02-12 DIAGNOSIS — Z8546 Personal history of malignant neoplasm of prostate: Secondary | ICD-10-CM | POA: Insufficient documentation

## 2019-02-12 DIAGNOSIS — Z794 Long term (current) use of insulin: Secondary | ICD-10-CM | POA: Insufficient documentation

## 2019-02-12 DIAGNOSIS — M7989 Other specified soft tissue disorders: Secondary | ICD-10-CM

## 2019-02-12 DIAGNOSIS — Z431 Encounter for attention to gastrostomy: Secondary | ICD-10-CM

## 2019-02-12 DIAGNOSIS — Z79899 Other long term (current) drug therapy: Secondary | ICD-10-CM | POA: Insufficient documentation

## 2019-02-12 DIAGNOSIS — R1312 Dysphagia, oropharyngeal phase: Secondary | ICD-10-CM | POA: Diagnosis not present

## 2019-02-12 DIAGNOSIS — R609 Edema, unspecified: Principal | ICD-10-CM

## 2019-02-12 DIAGNOSIS — I2510 Atherosclerotic heart disease of native coronary artery without angina pectoris: Secondary | ICD-10-CM

## 2019-02-12 DIAGNOSIS — Z951 Presence of aortocoronary bypass graft: Secondary | ICD-10-CM | POA: Insufficient documentation

## 2019-02-12 DIAGNOSIS — I252 Old myocardial infarction: Secondary | ICD-10-CM

## 2019-02-12 DIAGNOSIS — K9423 Gastrostomy malfunction: Secondary | ICD-10-CM | POA: Insufficient documentation

## 2019-02-12 DIAGNOSIS — F329 Major depressive disorder, single episode, unspecified: Secondary | ICD-10-CM | POA: Insufficient documentation

## 2019-02-12 DIAGNOSIS — M79601 Pain in right arm: Secondary | ICD-10-CM

## 2019-02-12 HISTORY — DX: Generalized anxiety disorder: F41.1

## 2019-02-12 HISTORY — DX: Extended spectrum beta lactamase (ESBL) resistance: Z16.12

## 2019-02-12 HISTORY — DX: Acute respiratory failure with hypoxia: J96.01

## 2019-02-12 HISTORY — DX: Hemiplegia and hemiparesis following cerebral infarction affecting right dominant side: I69.351

## 2019-02-12 HISTORY — DX: Unspecified severe protein-calorie malnutrition: E43

## 2019-02-12 HISTORY — DX: Unspecified atrial fibrillation: I48.91

## 2019-02-12 HISTORY — DX: Unspecified disorder of adult personality and behavior: F69

## 2019-02-12 HISTORY — DX: Dysphagia following cerebral infarction: I69.391

## 2019-02-12 HISTORY — DX: Encephalopathy, unspecified: G93.40

## 2019-02-12 HISTORY — DX: Nontraumatic intracerebral hemorrhage in hemisphere, subcortical: I61.0

## 2019-02-12 HISTORY — DX: Aphasia following cerebral infarction: I69.320

## 2019-02-12 HISTORY — DX: Acute gastric ulcer with hemorrhage: K25.0

## 2019-02-12 HISTORY — DX: Non-ST elevation (NSTEMI) myocardial infarction: I21.4

## 2019-02-12 HISTORY — DX: Diverticulosis of intestine, part unspecified, without perforation or abscess without bleeding: K57.90

## 2019-02-12 MED ORDER — IOPAMIDOL (ISOVUE-300) INJECTION 61%
INTRAVENOUS | Status: AC
  Administered 2019-02-12: 50 mL via GASTROSTOMY
  Filled 2019-02-12: qty 50

## 2019-02-12 MED ORDER — LORAZEPAM 0.5 MG PO TABS: 0.5000 mg | ORAL_TABLET | Freq: Four times a day (QID) | ORAL | 0 refills | Status: DC | PRN

## 2019-02-12 MED ORDER — SODIUM CHLORIDE 0.9% FLUSH
INTRAVENOUS | Status: AC
  Filled 2019-02-12: qty 10

## 2019-02-12 NOTE — ED Notes (Addendum)
Pt pulled out Peg Tube to abdomen. No redness or induration to area.

## 2019-02-12 NOTE — ED Provider Notes (Signed)
[facility] EMERGENCY DEPARTMENT Provider Note   CSN: [id number] Arrival date & time: 02/12/19  4034    History   Chief Complaint Chief Complaint  Patient presents with  . Peg Tube Pulled Out    HPI MACALLAN ORD is a 73 y.o. male.     The history is provided by the nursing home. The history is limited by the condition of the patient (Hx aphasia).    Pt was seen at 0750. Per NH report: Pt pulled out his PEG tube between 5-6:30am today. Pt was just d/c from the ED several hours ago for similar complaint.   Past Medical History:  Diagnosis Date  . A-fib ([place])    a. Post-op afib after CABG 08/2012.  [name] Acute gastric ulcer with hemorrhage   . Acute respiratory failure with hypoxia ([place])   . Aphasia following cerebral infarction   . Atrial fibrillation ([place])   . CAD (coronary artery disease)    a. NSTEMI s/p stent to distal RCA 03/2000. b. Inf MI s/p emergent thrombectomy/stenting mid RCA 10/2000. c. NSTEMI s/p CABGx4 (LIMA-LAD, SVG-OM2, seq SVG-acute marginal and PD) 06/14/24 - course complicated by confusion, post-op AF, L pleural effusion with thoracentesis. d. Normal LV function by echo 08/2012.  . Diabetes mellitus   . Diverticulosis   . Dysphagia following cerebral infarction   . Encephalopathy   . Extended spectrum beta lactamase (ESBL) resistance   . Generalized anxiety disorder   . GERD (gastroesophageal reflux disease)   . Hemiplegia and hemiparesis following cerebral infarction affecting right dominant side ([place])   . Hiatal hernia   . HLD (hyperlipidemia)   . HTN (hypertension)   . Non-ST elevation (NSTEMI) myocardial infarction ([place])   . Nontraumatic intracerebral hemorrhage in hemisphere, subcortical ([place])   . Peripheral vascular disease ([place])    a. Carotid dopplers neg 08/13/12. b. Pre-cabg ABIs - R=0.87 suggesting mild dz, L=1.29 possibly falsely elevated due to calcified vessels.  . Pleural effusion    a. L pleural eff after CABG s/p thoracentesis 08/22/12.  .  Prostate cancer [facility])    Status post radiation treatment.  . Prostatic hypertrophy    a. Hx of urinary retention, awaiting TURP.  [name] Severe protein-calorie malnutrition ([place])   . Tobacco abuse   . 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 Valvular heart disease    a. Mild  MR by TEE 08/2012.    Patient Active Problem List   Diagnosis Date Noted  . Hemorrhagic cerebrovascular accident (CVA) ([place]) 02/09/2019  . Hypertension associated with diabetes ([place]) 01/28/2019  . Dyslipidemia associated with type 2 diabetes mellitus ([place]) 01/28/2019  . Dysphagia, oropharyngeal phase 01/28/2019  . Type 2 diabetes mellitus with peripheral vascular disease ([place]) 01/28/2019  . Major depression with psychotic features ([place]) 01/28/2019  . Protein-calorie malnutrition, severe 01/08/2019  . UTI (urinary tract infection) 01/06/2019  . Pressure injury of skin 01/05/2019  . Acute UTI 01/04/2019  . Proctitis 01/04/2019  . History of hemorrhagic cerebrovascular accident (CVA) with residual deficit 01/04/2019  . Diarrhea 01/03/2019  . Urinary retention 12/24/2018  . IVH (intraventricular hemorrhage) ([place]) 11/18/2018  . Nontraumatic subcortical hemorrhage of left cerebral hemisphere ([place]) 11/18/2018  . CAD (coronary artery disease)   . Peripheral vascular disease ([place])   . A-fib One Episode after CABG in 2013   . Prostatic hypertrophy   . NSTEMI (non-ST elevated myocardial infarction) ([place]) 08/15/2012  . IDDM (insulin dependent diabetes mellitus) ([place]) 08/15/2012  . HTN (hypertension) 08/15/2012  .  Hyperlipidemia 08/15/2012    Past Surgical History:  Procedure Laterality Date  . APPENDECTOMY    . BACK SURGERY    . CORONARY ARTERY BYPASS GRAFT  08/16/2012   Procedure: CORONARY ARTERY BYPASS GRAFTING (CABG);  Surgeon: Melrose Nakayama, MD;  Location: [place];  Service: Open Heart Surgery;  Laterality: N/A;  . LEFT HEART CATHETERIZATION WITH CORONARY ANGIOGRAM N/A 08/14/2012   Procedure:  LEFT HEART CATHETERIZATION WITH CORONARY ANGIOGRAM;  Surgeon: Peter M Martinique, MD;  Location: [facility] CATH LAB;  Service: Cardiovascular;  Laterality: N/A;        Home Medications    Prior to Admission medications   Medication Sig Start Date End Date Taking? Authorizing Provider  Amino Acids-Protein Hydrolys (FEEDING SUPPLEMENT, PRO-STAT SUGAR FREE 64,) LIQD Place 30 mLs into feeding tube 3 (three) times daily.  01/29/19   [provider]  amLODipine (NORVASC) 5 MG tablet Place 5 mg into feeding tube daily.    [provider]  atorvastatin (LIPITOR) 40 MG tablet Place 40 mg into feeding tube at bedtime.  01/29/19   [provider]  baclofen (LIORESAL) 10 MG tablet 1 tablet by Gastric Tube route 2 (two) times daily.  02/04/19   [provider]  Dimethicone 1.3 % LOTN Apply 1 application topically See admin instructions. Apply to the extremities and back daily and as needed for itching /dry skin. Do not apply to buttocks wound.    [provider]  diphenoxylate-atropine (LOMOTIL) 2.5-0.025 MG tablet Place 2 tablets into feeding tube every 4 (four) hours as needed for diarrhea or loose stools.    [provider]  FLUoxetine (PROZAC) 20 MG/5ML solution Place 20 mg into feeding tube daily.     [provider]  GLUCERNA ([place]) LIQD Give 1.5  @@ 70cc per hour continuously via gastric tube.  Run for 20 hours each day.  Hold TF 8 am - 12 pm   Turn Tube feeding off every day at 0800 per dietician orders 01/22/19   [provider]  hydrocortisone cream (PREPARATION H) 1 % Apply rectally twice daily and as needed for hemorrhoids 01/12/19   [provider]  insulin aspart (NOVOLOG FLEXPEN) 100 UNIT/ML FlexPen Give subcutaneous per sliding scale every 6 hours; If blood sugars are less than 60, CALL MD 200-250= 4 units 251-300= 6 units 301-351= 8 units 351-400= 10 units If Blood sugar is greater than 400, call MD 01/13/19   [provider]  Insulin Glargine (LANTUS SOLOSTAR) 100 UNIT/ML Solostar Pen Inject 30 Units into the skin daily.  01/14/19   [provider]  lansoprazole (PREVACID SOLUTAB) 30 MG disintegrating tablet Place 30 mg into feeding tube 2 (two) times daily. 01/30/19   [provider]  Melatonin 1 MG TABS 2 tablets by Gastric Tube route at bedtime.  01/30/19   [provider]  metoprolol tartrate (LOPRESSOR) 25 MG tablet Place 12.5 mg into feeding tube 2 (two) times daily. 01/09/19   [provider]  NON FORMULARY Diet Type:   NPO    [provider]  oseltamivir (TAMIFLU) 75 MG capsule Place 75 mg into feeding tube at bedtime.  02/04/19 02/18/19  [provider]  Water For Irrigation, Sterile (STERILE WATER FOR IRRIGATION) Flush with 250 cc every 6 hours 01/21/19   [provider]  Wound Dressings (HYDROGEL EX) Apply hydrogel moistened gauze and cover with coverderm dressing.  Change daily and as needed 01/18/19   [provider]    [facility]  History No family history on file.  Social History Social History   Tobacco Use  . Smoking status: Former Smoker    Last attempt to quit: 11/12/1972    Years since quitting: 46.2  . Smokeless tobacco: Never Used  Substance Use Topics  . Alcohol use: No  . Drug use: No     Allergies   Patient has no known allergies.   Review of Systems Review of Systems  Unable to perform ROS: Patient nonverbal     Physical Exam Updated Vital Signs BP 133/75   Pulse 76   Temp 98.1 F (36.7 C)   Resp 15   Ht 5\' 11"  (1.803 m)   Wt 74.2 kg   SpO2 95%   BMI 22.82 kg/m   Physical Exam 0755: Physical examination:  Nursing notes reviewed; Vital signs and O2 SAT reviewed;  Constitutional: Well developed, Well nourished, Well hydrated, In no acute distress; Head:  Normocephalic, atraumatic; Eyes: EOMI, PERRL, No scleral icterus; ENMT: Mouth and pharynx normal, Mucous membranes moist; Neck: Supple, Full  range of motion; Cardiovascular: Regular rate and rhythm, No gallop; Respiratory: Breath sounds clear & equal bilaterally, No wheezes. Normal respiratory effort/excursion; Chest: Nontender, Movement normal; Abdomen: Soft, Nontender, Nondistended, Normal bowel sounds. +PEG site without tube, site itself without drainage, no erythema, no ecchymosis, no bleeding, no soft tissue crepitus.; Genitourinary: No CVA tenderness; Extremities: Peripheral pulses normal, No tenderness, No edema, No calf edema or asymmetry.; Neuro: Awake/alert. Aphasic with right sided hemiparesis per hx..; Skin: Color normal, Warm, Dry.   ED Treatments / Results  Labs (all labs ordered are listed, but only abnormal results are displayed)   EKG None  Radiology    Procedures Gastrostomy tube replacement Date/Time: 02/12/2019 8:15 AM Performed by: Francine Graven, DO Authorized by: Francine Graven, DO  Consent: Verbal consent obtained. Risks and benefits: risks, benefits and alternatives were discussed Consent given by: spouse Patient understanding: patient states understanding of the procedure being performed Imaging studies: imaging studies available Required items: required blood products, implants, devices, and special equipment available Patient identity confirmed: hospital-assigned identification number, arm band and provided demographic data Time out: Immediately prior to procedure a "time out" was called to verify the correct patient, procedure, equipment, support staff and site/side marked as required. Preparation: Patient was prepped and draped in the usual sterile fashion. Local anesthesia used: no  Anesthesia: Local anesthesia used: no  Sedation: Patient sedated: no  Patient tolerance: Patient tolerated the procedure well with no immediate complications Comments: 16X PEG tube replaced. Follow up XR to verify placement was ordered.          Medications Ordered in ED Medications  sodium  chloride flush (NS) 0.9 % injection (has no administration in time range)  iopamidol (ISOVUE-300) 61 % injection (50 mLs Percutaneous Endoscopic Gastrostomy Contrast Given 02/12/19 0850)     Initial Impression / Assessment and Plan / ED Course  I have reviewed the triage vital signs and the nursing notes.  Pertinent labs & imaging results that were available during my care of the patient were reviewed by me and considered in my medical decision making (see chart for details).     MDM Reviewed: previous chart, nursing note and vitals Interpretation: x-ray    Dg Abdomen Peg Tube Location Result Date: 02/12/2019 CLINICAL DATA:  Check peg placement EXAM: ABDOMEN - 1 VIEW COMPARISON:  None. FINDINGS: Contrast material was injected via a gastrostomy catheter and shows free flow contrast material into the stomach. Contrast is also  noted within the colon from previous injections. IMPRESSION: Gastrostomy catheter within the stomach. Electronically Signed   By: Inez Catalina M.D.   On: 02/12/2019 09:24    0940:  PEG tube replaced with another 25F without difficulty. Follow up XR as above. Will d/c back to NH stable.    Final Clinical Impressions(s) / ED Diagnoses   Final diagnoses:  PEG (percutaneous endoscopic gastrostomy) adjustment/replacement/removal [facility])    ED Discharge Orders    None       Francine Graven, DO 02/16/19 1749

## 2019-02-12 NOTE — ED Provider Notes (Signed)
[facility] EMERGENCY DEPARTMENT Provider Note   CSN: [id number] Arrival date & time: 02/12/19  1458    History   Chief Complaint Chief Complaint  Patient presents with  . Peg tube removed    HPI [name] is a 73 y.o. male.     HPI Patient presents for the third time in 24 hours with concern of a removed PEG tube. Patient has a history of stroke, ischemic converting to hemorrhagic, now with persistent hemiplegia, as well as inability to communicate consistently. Level 5 caveat secondary to the patient's cognitive changes. I have seen and evaluated the patient in the past, following a stroke. Patient has a PEG tube in place due to failed swallow study, which is been repeated as recently as 2 weeks ago, with similar results. However, the patient does have some improved speech capacity over the recent weeks, some improved cognitive bursts as well. During the past day the patient has seemingly become more aware of having a PEG tube in place, has withdrawn it, seemingly voluntarily. It has been placed twice here, by ED providers. Now he presents due to removing his PEG tube again, just prior to ED arrival. Level 5 caveat secondary to the patient's mental status.  He is, however, accompanied by his wife who provides much of the HPI. Past Medical History:  Diagnosis Date  . A-fib ([place])    a. Post-op afib after CABG 08/2012.  [name] Acute gastric ulcer with hemorrhage   . Acute respiratory failure with hypoxia ([place])   . Aphasia following cerebral infarction   . Atrial fibrillation ([place])   . CAD (coronary artery disease)    a. NSTEMI s/p stent to distal RCA 03/2000. b. Inf MI s/p emergent thrombectomy/stenting mid RCA 10/2000. c. NSTEMI s/p CABGx4 (LIMA-LAD, SVG-OM2, seq SVG-acute marginal and PD) 1/0/93 - course complicated by confusion, post-op AF, L pleural effusion with thoracentesis. d. Normal LV function by echo 08/2012.  . Diabetes mellitus   . Diverticulosis   . Dysphagia  following cerebral infarction   . Encephalopathy   . Extended spectrum beta lactamase (ESBL) resistance   . Generalized anxiety disorder   . GERD (gastroesophageal reflux disease)   . Hemiplegia and hemiparesis following cerebral infarction affecting right dominant side ([place])   . Hiatal hernia   . HLD (hyperlipidemia)   . HTN (hypertension)   . Non-ST elevation (NSTEMI) myocardial infarction ([place])   . Nontraumatic intracerebral hemorrhage in hemisphere, subcortical ([place])   . Peripheral vascular disease ([place])    a. Carotid dopplers neg 08/13/12. b. Pre-cabg ABIs - R=0.87 suggesting mild dz, L=1.29 possibly falsely elevated due to calcified vessels.  . Pleural effusion    a. L pleural eff after CABG s/p thoracentesis 08/22/12.  . Prostate cancer [facility])    Status post radiation treatment.  . Prostatic hypertrophy    a. Hx of urinary retention, awaiting TURP.  [name] Severe protein-calorie malnutrition ([place])   . Tobacco abuse   . 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 Valvular heart disease    a. Mild  MR by TEE 08/2012.    Patient Active Problem List   Diagnosis Date Noted  . Hemorrhagic cerebrovascular accident (CVA) ([place]) 02/09/2019  . Hypertension associated with diabetes ([place]) 01/28/2019  . Dyslipidemia associated with type 2 diabetes mellitus ([place]) 01/28/2019  . Dysphagia, oropharyngeal phase 01/28/2019  . Type 2 diabetes mellitus with peripheral vascular disease ([place]) 01/28/2019  . Major depression with psychotic features ([place]) 01/28/2019  .  Protein-calorie malnutrition, severe 01/08/2019  . UTI (urinary tract infection) 01/06/2019  . Pressure injury of skin 01/05/2019  . Acute UTI 01/04/2019  . Proctitis 01/04/2019  . History of hemorrhagic cerebrovascular accident (CVA) with residual deficit 01/04/2019  . Diarrhea 01/03/2019  . Urinary retention 12/24/2018  . IVH (intraventricular hemorrhage) ([place]) 11/18/2018  . Nontraumatic subcortical hemorrhage of left  cerebral hemisphere ([place]) 11/18/2018  . CAD (coronary artery disease)   . Peripheral vascular disease ([place])   . A-fib One Episode after CABG in 2013   . Prostatic hypertrophy   . NSTEMI (non-ST elevated myocardial infarction) ([place]) 08/15/2012  . IDDM (insulin dependent diabetes mellitus) ([place]) 08/15/2012  . HTN (hypertension) 08/15/2012  . Hyperlipidemia 08/15/2012    Past Surgical History:  Procedure Laterality Date  . APPENDECTOMY    . BACK SURGERY    . CORONARY ARTERY BYPASS GRAFT  08/16/2012   Procedure: CORONARY ARTERY BYPASS GRAFTING (CABG);  Surgeon: Melrose Nakayama, MD;  Location: [place];  Service: Open Heart Surgery;  Laterality: N/A;  . LEFT HEART CATHETERIZATION WITH CORONARY ANGIOGRAM N/A 08/14/2012   Procedure: LEFT HEART CATHETERIZATION WITH CORONARY ANGIOGRAM;  Surgeon: Peter M Martinique, MD;  Location: [facility] CATH LAB;  Service: Cardiovascular;  Laterality: N/A;        Home Medications    Prior to Admission medications   Medication Sig Start Date End Date Taking? Authorizing Provider  Amino Acids-Protein Hydrolys (FEEDING SUPPLEMENT, PRO-STAT SUGAR FREE 64,) LIQD Place 30 mLs into feeding tube 3 (three) times daily.  01/29/19   [provider]  amLODipine (NORVASC) 5 MG tablet Place 5 mg into feeding tube daily.    [provider]  atorvastatin (LIPITOR) 40 MG tablet Place 40 mg into feeding tube at bedtime.  01/29/19   [provider]  baclofen (LIORESAL) 10 MG tablet 1 tablet by Gastric Tube route 2 (two) times daily.  02/04/19   [provider]  Dimethicone 1.3 % LOTN Apply 1 application topically See admin instructions. Apply to the extremities and back daily and as needed for itching /dry skin. Do not apply to buttocks wound.    [provider]  diphenoxylate-atropine (LOMOTIL) 2.5-0.025 MG tablet Place 2 tablets into feeding tube every 4 (four) hours as needed for diarrhea or loose stools.    [provider]    FLUoxetine (PROZAC) 20 MG/5ML solution Place 20 mg into feeding tube daily.     [provider]  GLUCERNA ([place]) LIQD Give 1.5  @@ 70cc per hour continuously via gastric tube.  Run for 20 hours each day.  Hold TF 8 am - 12 pm   Turn Tube feeding off every day at 0800 per dietician orders 01/22/19   [provider]  hydrocortisone cream (PREPARATION H) 1 % Apply rectally twice daily and as needed for hemorrhoids 01/12/19   [provider]  insulin aspart (NOVOLOG FLEXPEN) 100 UNIT/ML FlexPen Give subcutaneous per sliding scale every 6 hours; If blood sugars are less than 60, CALL MD 200-250= 4 units 251-300= 6 units 301-351= 8 units 351-400= 10 units If Blood sugar is greater than 400, call MD 01/13/19   [provider]  Insulin Glargine (LANTUS SOLOSTAR) 100 UNIT/ML Solostar Pen Inject 30 Units into the skin daily.  01/14/19   [provider]  lansoprazole (PREVACID SOLUTAB) 30 MG disintegrating tablet Place 30 mg into feeding tube 2 (two) times daily. 01/30/19   [provider]  LORazepam (ATIVAN) 0.5 MG tablet Take 1 tablet (  0.5 mg total) by mouth every 6 (six) hours as needed for up to 14 days for anxiety. 02/12/19 02/26/19  [name], NP  Melatonin 1 MG TABS 2 tablets by Gastric Tube route at bedtime.  01/30/19   [provider]  metoprolol tartrate (LOPRESSOR) 25 MG tablet Place 12.5 mg into feeding tube 2 (two) times daily. 01/09/19   [provider]  NON FORMULARY Diet Type:   NPO    [provider]  oseltamivir (TAMIFLU) 75 MG capsule Place 75 mg into feeding tube at bedtime.  02/04/19 02/18/19  [provider]  Ostomy Supplies ([place]) [place] Apply to bilateral heels every shift for prevention 12/26/18   [provider]  QUEtiapine (SEROQUEL) 25 MG tablet Place 12.5 mg into feeding tube at bedtime. 02/11/19   [provider]  Water For Irrigation, Sterile (STERILE WATER FOR  IRRIGATION) Flush with 250 cc every 6 hours 01/21/19   [provider]  Wound Dressings (HYDROGEL EX) Apply hydrogel moistened gauze and cover with coverderm dressing.  Change daily and as needed 01/18/19   [provider]    Family History No family history on file.  Social History Social History   Tobacco Use  . Smoking status: Former Smoker    Last attempt to quit: 11/12/1972    Years since quitting: 46.2  . Smokeless tobacco: Never Used  Substance Use Topics  . Alcohol use: No  . Drug use: No     Allergies   Patient has no known allergies.   Review of Systems Review of Systems  Unable to perform ROS: Mental status change     Physical Exam Updated Vital Signs BP (!) 157/99 (BP Location: Left Arm)   Pulse 96   Temp 99 F (37.2 C) (Oral)   Resp 16   Ht 5\' 11"  (1.803 m)   Wt 76.4 kg   SpO2 96%   BMI 23.49 kg/m   Physical Exam Vitals signs and nursing note reviewed.  Constitutional:      General: He is not in acute distress.    Appearance: He is well-developed.     Comments: Alert though noninteractive elderly male in no distress  HENT:     Head: Normocephalic and atraumatic.  Eyes:     Conjunctiva/sclera: Conjunctivae normal.  Cardiovascular:     Rate and Rhythm: Normal rate and regular rhythm.  Pulmonary:     Effort: Pulmonary effort is normal. No respiratory distress.     Breath sounds: No stridor.  Abdominal:     General: There is no distension.     Comments: Lower abdomen with unremarkable, nearly closed area where PEG tube was previously inserted.  Skin:    General: Skin is warm and dry.  Neurological:     Mental Status: He is alert.     Comments: Does not follow commands, does not speak substantially spontaneously, answers some questions occasionally, with intermittent accuracy. Right-sided hemiplegia  Psychiatric:        Cognition and Memory: Cognition is impaired.      ED Treatments / Results   Radiology Dg Abdomen Peg  Tube Location  Result Date: 02/12/2019 CLINICAL DATA:  Check peg placement EXAM: ABDOMEN - 1 VIEW COMPARISON:  None. FINDINGS: Contrast material was injected via a gastrostomy catheter and shows free flow contrast material into the stomach. Contrast is also noted within the colon from previous injections. IMPRESSION: Gastrostomy catheter within the stomach. Electronically Signed   By: Inez Catalina  M.D.   On: 02/12/2019 09:24   Dg Abdomen Peg Tube Location  Result Date: 02/11/2019 CLINICAL DATA:  Peg tube placement. EXAM: ABDOMEN - 1 VIEW COMPARISON:  CT abdomen and pelvis 01/04/2019 FINDINGS: 15 mL Omnipaque 300 and 15 mL of water was administered via the existing PEG tube. Contrast is seen within the stomach and proximal duodenum. There is no leak. IMPRESSION: Peg tube placement confirmed within the stomach. Electronically Signed   By: San Morelle M.D.   On: 02/11/2019 19:39    Procedures Procedures (including critical care time)  Medications Ordered in ED Medications - No data to display   Initial Impression / Assessment and Plan / ED Course  I have reviewed the triage vital signs and the nursing notes.  Pertinent labs & imaging results that were available during my care of the patient were reviewed by me and considered in my medical decision making (see chart for details).    Update: Patient in no distress, sleeping. I had a lengthy conversation with the patient's wife about his stroke, diminished cognitive capacity currently, irritation with having a PEG tube, and his risk for continually removing it. We discussed options for therapy including placement, repeat swallow study to discern whether he needs again. We agreed that with his risk of pulling it out again, will pursue options including swallow study, tomorrow.    6:03 PM I have discussed the patient's presentation with the nurse practitioners at his nursing facility, who will help arrange swallow study, hopefully  tomorrow. I also discussed the patient's case with our interventional radiology team, who will arrange for placement of a new gastrostomy tube tomorrow. Here, patient's stoma is closed, and he is in no distress, no evidence for dehydration, no indication for emergent IV therapy. Wife is aware of all findings, thoughts thus far, amenable to trial of swallow study tomorrow, with hope of possibly not needing his PEG tube. This was communicated to our nursing colleagues at the rehabilitation center, the patient was discharged in stable condition.  Final Clinical Impressions(s) / ED Diagnoses   Final diagnoses:  Malfunction of percutaneous endoscopic gastrostomy (PEG) tube [facility])     Carmin Muskrat, MD 02/12/19 1815

## 2019-02-12 NOTE — ED Triage Notes (Signed)
Pt sent over from [facility] due to pulling out his peg tube sometime between 5 and 6:30am this morning. Pt just pulled out his peg tube yesterday and had a new one replaced yesterday here at [facility].

## 2019-02-12 NOTE — Progress Notes (Signed)
Location:   [place] Room Number: 130 P Place of Service:  SNF (31)   CODE STATUS: Full Code  No Known Allergies  Chief Complaint  Patient presents with  . Acute Visit    Peg Tube    HPI:  He has pulled out his peg tube three times in the past 24 hours. He has been restarted on his seroquel as he failed coming off this medication. He will need to have his tube reinserted in the AM. I have spoke with the Ed provider. We are going to change his feeding to bolus; in order to reduce stimuli for his peg tube. He will need prn anxiety medications while he restarts the seroquel. There are no reports of uncontrolled pain. He does display anxiety and agitation.   Past Medical History:  Diagnosis Date  . A-fib ([place])    a. Post-op afib after CABG 08/2012.  [name] Acute gastric ulcer with hemorrhage   . Acute respiratory failure with hypoxia ([place])   . Aphasia following cerebral infarction   . Atrial fibrillation ([place])   . CAD (coronary artery disease)    a. NSTEMI s/p stent to distal RCA 03/2000. b. Inf MI s/p emergent thrombectomy/stenting mid RCA 10/2000. c. NSTEMI s/p CABGx4 (LIMA-LAD, SVG-OM2, seq SVG-acute marginal and PD) 07/12/00 - course complicated by confusion, post-op AF, L pleural effusion with thoracentesis. d. Normal LV function by echo 08/2012.  . Diabetes mellitus   . Diverticulosis   . Dysphagia following cerebral infarction   . Encephalopathy   . Extended spectrum beta lactamase (ESBL) resistance   . Generalized anxiety disorder   . GERD (gastroesophageal reflux disease)   . Hemiplegia and hemiparesis following cerebral infarction affecting right dominant side ([place])   . Hiatal hernia   . HLD (hyperlipidemia)   . HTN (hypertension)   . Non-ST elevation (NSTEMI) myocardial infarction ([place])   . Nontraumatic intracerebral hemorrhage in hemisphere, subcortical ([place])   . Peripheral vascular disease ([place])    a. Carotid dopplers neg 08/13/12. b. Pre-cabg  ABIs - R=0.87 suggesting mild dz, L=1.29 possibly falsely elevated due to calcified vessels.  . Pleural effusion    a. L pleural eff after CABG s/p thoracentesis 08/22/12.  . Prostate cancer [facility])    Status post radiation treatment.  . Prostatic hypertrophy    a. Hx of urinary retention, awaiting TURP.  [name] Severe protein-calorie malnutrition ([place])   . Tobacco abuse   . 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 Valvular heart disease    a. Mild  MR by TEE 08/2012.    Past Surgical History:  Procedure Laterality Date  . APPENDECTOMY    . BACK SURGERY    . CORONARY ARTERY BYPASS GRAFT  08/16/2012   Procedure: CORONARY ARTERY BYPASS GRAFTING (CABG);  Surgeon: Melrose Nakayama, MD;  Location: [place];  Service: Open Heart Surgery;  Laterality: N/A;  . LEFT HEART CATHETERIZATION WITH CORONARY ANGIOGRAM N/A 08/14/2012   Procedure: LEFT HEART CATHETERIZATION WITH CORONARY ANGIOGRAM;  Surgeon: Peter M Martinique, MD;  Location: [facility] CATH LAB;  Service: Cardiovascular;  Laterality: N/A;    Social History   Socioeconomic History  . Marital status: Married    Spouse name: Not on file  . Number of children: Not on file  . Years of education: Not on file  . Highest education level: Not on file  Occupational History  . Not on file  Social Needs  . Financial resource strain: Not  on file  . Food insecurity:    Worry: Not on file    Inability: Not on file  . Transportation needs:    Medical: Not on file    Non-medical: Not on file  Tobacco Use  . Smoking status: Former Smoker    Last attempt to quit: 11/12/1972    Years since quitting: 46.2  . Smokeless tobacco: Never Used  Substance and Sexual Activity  . Alcohol use: No  . Drug use: No  . Sexual activity: Never  Lifestyle  . Physical activity:    Days per week: Not on file    Minutes per session: Not on file  . Stress: Not on file  Relationships  . Social connections:    Talks on phone: Not on file    Gets together:  Not on file    Attends religious service: Not on file    Active member of club or organization: Not on file    Attends meetings of clubs or organizations: Not on file    Relationship status: Not on file  . Intimate partner violence:    Fear of current or ex partner: Not on file    Emotionally abused: Not on file    Physically abused: Not on file    Forced sexual activity: Not on file  Other Topics Concern  . Not on file  Social History Narrative  . Not on file   History reviewed. No pertinent family history.    VITAL SIGNS BP 123/63   Pulse 77   Temp (!) 97.1 F (36.2 C)   Resp 20   Ht 5\' 11"  (1.803 m)   Wt 168 lb 6.4 oz (76.4 kg)   BMI 23.49 kg/m   Outpatient Encounter Medications as of 02/12/2019  Medication Sig  . Amino Acids-Protein Hydrolys (FEEDING SUPPLEMENT, PRO-STAT SUGAR FREE 64,) LIQD Place 30 mLs into feeding tube 3 (three) times daily.   [name] amLODipine (NORVASC) 5 MG tablet Place 5 mg into feeding tube daily.  [name] atorvastatin (LIPITOR) 40 MG tablet Place 40 mg into feeding tube at bedtime.   . baclofen (LIORESAL) 10 MG tablet 1 tablet by Gastric Tube route 2 (two) times daily.   . Dimethicone 1.3 % LOTN Apply 1 application topically See admin instructions. Apply to the extremities and back daily and as needed for itching /dry skin. Do not apply to buttocks wound.  . diphenoxylate-atropine (LOMOTIL) 2.5-0.025 MG tablet Place 2 tablets into feeding tube every 4 (four) hours as needed for diarrhea or loose stools.  [name] FLUoxetine (PROZAC) 20 MG/5ML solution Place 20 mg into feeding tube daily.   [name] GLUCERNA ([place]) LIQD Give 1.5  @@ 70cc per hour continuously via gastric tube.  Run for 20 hours each day.  Hold TF 8 am - 12 pm   Turn Tube feeding off every day at 0800 per dietician orders  . hydrocortisone cream (PREPARATION H) 1 % Apply rectally twice daily and as needed for hemorrhoids  . insulin aspart (NOVOLOG FLEXPEN) 100 UNIT/ML FlexPen Give subcutaneous per  sliding scale every 6 hours; If blood sugars are less than 60, CALL MD 200-250= 4 units 251-300= 6 units 301-351= 8 units 351-400= 10 units If Blood sugar is greater than 400, call MD  . Insulin Glargine (LANTUS SOLOSTAR) 100 UNIT/ML Solostar Pen Inject 30 Units into the skin daily.   . lansoprazole (PREVACID SOLUTAB) 30 MG disintegrating tablet Place 30 mg into feeding tube 2 (two) times daily.  . Melatonin 1 MG  TABS 2 tablets by Gastric Tube route at bedtime.   . metoprolol tartrate (LOPRESSOR) 25 MG tablet Place 12.5 mg into feeding tube 2 (two) times daily.  . NON FORMULARY Diet Type:   NPO  . oseltamivir (TAMIFLU) 75 MG capsule Place 75 mg into feeding tube at bedtime.   Oneta Rack Supplies (SKIN PREP SPRAY) MISC Apply to bilateral heels every shift for prevention  . QUEtiapine (SEROQUEL) 25 MG tablet Place 12.5 mg into feeding tube at bedtime.  . Water For Irrigation, Sterile (STERILE WATER FOR IRRIGATION) Flush with 250 cc every 6 hours  . Wound Dressings (HYDROGEL EX) Apply hydrogel moistened gauze and cover with coverderm dressing.  Change daily and as needed   No facility-administered encounter medications on file as of 02/12/2019.      SIGNIFICANT DIAGNOSTIC EXAMS    LABS REVIEWED PREVIOUS:   01-04-19: urine culture: klebsiella oxytoca  01-16-19: wbc 10.1; hgb 13.9; hct 45.2; mcv 96.2; plt 287; glucose 371; bun 44; creat 1.10; k+ 4.7; na++137; ca 8.7 01-28-19: wbc 8.1; hgb 14.9; hct 48.4; mcv 98.;4 plt 282; glucose 285; bun 38; creat 1.17; k+ 4.3 na++ 143 ca 9.3  01-29-19: glucose 263; bun 39; creat 1.17;  k+ 4.0; na++ 139; ca 9.0; ast 52; alt 59  albumin 2.7 hgb a1c 7.6; chol 116; ldl 50; trig 236; hdl 19   NO NEW LABS.   Review of Systems  Reason unable to perform ROS: nonverbal     Physical Exam Constitutional:      General: He is not in acute distress.    Appearance: He is well-developed. He is not diaphoretic.  Neck:     Musculoskeletal: Neck supple.     Thyroid: No  thyromegaly.  Cardiovascular:     Rate and Rhythm: Normal rate and regular rhythm.     Heart sounds: Normal heart sounds.     Comments: Pedal pulses faint  Pulmonary:     Effort: Pulmonary effort is normal. No respiratory distress.     Breath sounds: Normal breath sounds.  Abdominal:     General: Bowel sounds are normal. There is no distension.     Palpations: Abdomen is soft.     Tenderness: There is no abdominal tenderness.     Comments: Peg tube out   Genitourinary:    Comments: Foley  Musculoskeletal:     Right lower leg: No edema.     Left lower leg: No edema.     Comments: Right side hemiparesis Able to move left extremities       Lymphadenopathy:     Cervical: No cervical adenopathy.  Skin:    General: Skin is warm and dry.  Neurological:     Mental Status: He is alert. Mental status is at baseline.  Psychiatric:        Mood and Affect: Mood normal.     ASSESSMENT/ PLAN:  TODAY;   1. Dysphagia oropharyngeal phase: is without change: will begin xanax 0.5 mg every 6 hours as needed for 14 days; will reinsert peg tube in the AM; will change his glucerna 1.5 to every 6 hours bolus feedings and will monitor his status.         MD is aware of resident's narcotic use and is in agreement with current plan of care. We will attempt to wean resident as apropriate   Ok Edwards NP [facility]  Contact [phone] Monday through Friday 8am- 5pm  After hours call [phone]

## 2019-02-12 NOTE — Discharge Instructions (Addendum)
As discussed, tomorrow our interventional radiology colleagues will contact you to arrange for replacement of the feeding tube.  Return here for concerning changes in your condition.

## 2019-02-12 NOTE — Discharge Instructions (Signed)
Take your usual prescriptions as previously directed.  Call your regular medical doctor today to schedule a follow up appointment within the next 2 days.  Return to the Emergency Department immediately sooner if worsening.  ° °

## 2019-02-12 NOTE — ED Triage Notes (Signed)
Pt here from [facility]. Pt was here this am and had peg tube replaced and sent back to [facility]. Pt pulled peg tube out about 1000 this am again.

## 2019-02-13 ENCOUNTER — Ambulatory Visit (HOSPITAL_COMMUNITY)
Admission: RE | Admit: 2019-02-13 | Discharge: 2019-02-13 | Disposition: A | Discharge status: Home / Self Care | Payer: Medicare Other | Source: Ambulatory Visit | Attending: Family Medicine | Admitting: Family Medicine

## 2019-02-13 ENCOUNTER — Encounter (HOSPITAL_COMMUNITY): Payer: Self-pay | Admitting: Diagnostic Radiology

## 2019-02-13 ENCOUNTER — Other Ambulatory Visit (HOSPITAL_COMMUNITY): Payer: Self-pay | Admitting: Family Medicine

## 2019-02-13 ENCOUNTER — Non-Acute Institutional Stay (SKILLED_NURSING_FACILITY): Payer: Medicare Other | Admitting: Adult Health

## 2019-02-13 DIAGNOSIS — I639 Cerebral infarction, unspecified: Secondary | ICD-10-CM

## 2019-02-13 DIAGNOSIS — I610 Nontraumatic intracerebral hemorrhage in hemisphere, subcortical: Secondary | ICD-10-CM | POA: Diagnosis not present

## 2019-02-13 DIAGNOSIS — Z431 Encounter for attention to gastrostomy: Secondary | ICD-10-CM | POA: Diagnosis not present

## 2019-02-13 DIAGNOSIS — R1312 Dysphagia, oropharyngeal phase: Secondary | ICD-10-CM | POA: Diagnosis not present

## 2019-02-13 HISTORY — PX: IR REPLC GASTRO/COLONIC TUBE PERCUT W/FLUORO: IMG2333

## 2019-02-13 MED ORDER — IOPAMIDOL (ISOVUE-300) INJECTION 61%
INTRAVENOUS | Status: AC
  Administered 2019-02-13: 15 mL
  Filled 2019-02-13: qty 50

## 2019-02-13 MED ORDER — LIDOCAINE VISCOUS HCL 2 % MT SOLN
OROMUCOSAL | Status: AC
  Administered 2019-02-13: 15 mL
  Filled 2019-02-13: qty 15

## 2019-02-13 MED ORDER — LIDOCAINE HCL 1 % IJ SOLN
INTRAMUSCULAR | Status: AC
  Filled 2019-02-13: qty 20

## 2019-02-13 NOTE — Progress Notes (Signed)
Location:   [facility] Room Number: 130 P Place of Service:  SNF (31)   CODE STATUS: Full Code  No Known Allergies  Chief Complaint  Patient presents with  . Acute Visit    Care Plan Meeting    HPI:  We have come together for a care plan meeting. His wife is present. He has had his 3rd peg tube placed today in the past 24 hours. He is using an abdominal binder. We have changed his peg tube feeding to bolus feedings to help reduce visual stimuli of his peg tube. There are no reports of uncontrolled pain. He continues to require total care for his adls. He is able to take applesauce.  We have discussed his advanced directives with his wife. 3 weeks prior to his stroke; their son died (he was a Psychologist, clinical). She is wanting everything done to sustain his life.she does understand risks and benefits.   Past Medical History:  Diagnosis Date  . A-fib ([place])    a. Post-op afib after CABG 08/2012.  [name] Acute gastric ulcer with hemorrhage   . Acute respiratory failure with hypoxia ([place])   . Aphasia following cerebral infarction   . Atrial fibrillation ([place])   . CAD (coronary artery disease)    a. NSTEMI s/p stent to distal RCA 03/2000. b. Inf MI s/p emergent thrombectomy/stenting mid RCA 10/2000. c. NSTEMI s/p CABGx4 (LIMA-LAD, SVG-OM2, seq SVG-acute marginal and PD) 01/15/22 - course complicated by confusion, post-op AF, L pleural effusion with thoracentesis. d. Normal LV function by echo 08/2012.  . Diabetes mellitus   . Diverticulosis   . Dysphagia following cerebral infarction   . Encephalopathy   . Extended spectrum beta lactamase (ESBL) resistance   . Generalized anxiety disorder   . GERD (gastroesophageal reflux disease)   . Hemiplegia and hemiparesis following cerebral infarction affecting right dominant side ([place])   . Hiatal hernia   . HLD (hyperlipidemia)   . HTN (hypertension)   . Non-ST elevation (NSTEMI) myocardial infarction ([place] )   . Nontraumatic  intracerebral hemorrhage in hemisphere, subcortical ([place])   . Peripheral vascular disease ([place])    a. Carotid dopplers neg 08/13/12. b. Pre-cabg ABIs - R=0.87 suggesting mild dz, L=1.29 possibly falsely elevated due to calcified vessels.  . Pleural effusion    a. L pleural eff after CABG s/p thoracentesis 08/22/12.  . Prostate cancer [facility])    Status post radiation treatment.  . Prostatic hypertrophy    a. Hx of urinary retention, awaiting TURP.  [name] Severe protein-calorie malnutrition ([place])   . Tobacco abuse   . 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 Valvular heart disease    a. Mild  MR by TEE 08/2012.    Past Surgical History:  Procedure Laterality Date  . APPENDECTOMY    . BACK SURGERY    . CORONARY ARTERY BYPASS GRAFT  08/16/2012   Procedure: CORONARY ARTERY BYPASS GRAFTING (CABG);  Surgeon: Melrose Nakayama, MD;  Location: [facility];  Service: Open Heart Surgery;  Laterality: N/A;  . IR [place] TUBE CHANGE  02/13/2019  . LEFT HEART CATHETERIZATION WITH CORONARY ANGIOGRAM N/A 08/14/2012   Procedure: LEFT HEART CATHETERIZATION WITH CORONARY ANGIOGRAM;  Surgeon: Peter M Martinique, MD;  Location: [facility] CATH LAB;  Service: Cardiovascular;  Laterality: N/A;    Social History   Socioeconomic History  . Marital status: Married    Spouse name: Not on file  . Number of children: Not on file  .  Years of education: Not on file  . Highest education level: Not on file  Occupational History  . Not on file  Social Needs  . Financial resource strain: Not on file  . Food insecurity:    Worry: Not on file    Inability: Not on file  . Transportation needs:    Medical: Not on file    Non-medical: Not on file  Tobacco Use  . Smoking status: Former Smoker    Last attempt to quit: 11/12/1972    Years since quitting: 46.2  . Smokeless tobacco: Never Used  Substance and Sexual Activity  . Alcohol use: No  . Drug use: No  . Sexual activity: Never  Lifestyle  . Physical activity:    Days  per week: Not on file    Minutes per session: Not on file  . Stress: Not on file  Relationships  . Social connections:    Talks on phone: Not on file    Gets together: Not on file    Attends religious service: Not on file    Active member of club or organization: Not on file    Attends meetings of clubs or organizations: Not on file    Relationship status: Not on file  . Intimate partner violence:    Fear of current or ex partner: Not on file    Emotionally abused: Not on file    Physically abused: Not on file    Forced sexual activity: Not on file  Other Topics Concern  . Not on file  Social History Narrative  . Not on file   History reviewed. No pertinent family history.    VITAL SIGNS BP 135/87   Pulse 77   Temp 97.8 F (36.6 C)   Resp 20   Ht 5\' 11"  (1.803 m)   Wt 168 lb 6.4 oz (76.4 kg)   BMI 23.49 kg/m   Facility-Administered Encounter Medications as of 02/13/2019  Medication  . lidocaine (XYLOCAINE) 1 % (with pres) injection   Outpatient Encounter Medications as of 02/13/2019  Medication Sig  . Amino Acids-Protein Hydrolys (FEEDING SUPPLEMENT, PRO-STAT SUGAR FREE 64,) LIQD Place 30 mLs into feeding tube 3 (three) times daily.   [name] amLODipine (NORVASC) 5 MG tablet Place 5 mg into feeding tube daily.  [name] atorvastatin (LIPITOR) 40 MG tablet Place 40 mg into feeding tube at bedtime.   . baclofen (LIORESAL) 10 MG tablet 1 tablet by Gastric Tube route 2 (two) times daily.   . Dimethicone 1.3 % LOTN Apply 1 application topically See admin instructions. Apply to the extremities and back daily and as needed for itching /dry skin. Do not apply to buttocks wound.  . diphenoxylate-atropine (LOMOTIL) 2.5-0.025 MG tablet Place 2 tablets into feeding tube every 4 (four) hours as needed for diarrhea or loose stools.  [name] FLUoxetine (PROZAC) 20 MG/5ML solution Place 20 mg into feeding tube daily.   [name] GLUCERNA (GLUCERNA) LIQD Glucerna 1.5 Cal Liquid 0.08 - 1.5 gram - kcal/ml:  Give 1  can via gastric tube four times daily.  Flush with 30 cc before / after to help maintain patency  . hydrocortisone cream (PREPARATION H) 1 % Apply rectally twice daily and as needed for hemorrhoids  . insulin aspart (NOVOLOG FLEXPEN) 100 UNIT/ML FlexPen Give subcutaneous per sliding scale every 6 hours; If blood sugars are less than 60, CALL MD 200-250= 4 units 251-300= 6 units 301-351= 8 units 351-400= 10 units If Blood sugar is greater than 400, call MD  .  Insulin Glargine (LANTUS SOLOSTAR) 100 UNIT/ML Solostar Pen Inject 30 Units into the skin daily.   . lansoprazole (PREVACID SOLUTAB) 30 MG disintegrating tablet Place 30 mg into feeding tube 2 (two) times daily.  [name] LORazepam (ATIVAN) 0.5 MG tablet Place 0.5 mg into feeding tube every 6 (six) hours as needed for anxiety.  . Melatonin 1 MG TABS 2 tablets by Gastric Tube route at bedtime.   . metoprolol tartrate (LOPRESSOR) 25 MG tablet Place 12.5 mg into feeding tube 2 (two) times daily.  . NON FORMULARY Diet Type:   NPO  . oseltamivir (TAMIFLU) 75 MG capsule Place 75 mg into feeding tube at bedtime.   Oneta Rack Supplies (SKIN PREP SPRAY) MISC Apply to bilateral heels every shift for prevention  . QUEtiapine (SEROQUEL) 25 MG tablet Place 12.5 mg into feeding tube at bedtime.  . Water For Irrigation, Sterile (STERILE WATER FOR IRRIGATION) Flush with 250 cc every 6 hours  . Wound Dressings (HYDROGEL EX) Apply hydrogel moistened gauze and cover with coverderm dressing.  Change daily and as needed  . [DISCONTINUED] LORazepam (ATIVAN) 0.5 MG tablet Take 1 tablet (0.5 mg total) by mouth every 6 (six) hours as needed for up to 14 days for anxiety. (Patient not taking: Reported on 02/13/2019)     SIGNIFICANT DIAGNOSTIC EXAMS   LABS REVIEWED PREVIOUS:   01-04-19: urine culture: klebsiella oxytoca  01-16-19: wbc 10.1; hgb 13.9; hct 45.2; mcv 96.2; plt 287; glucose 371; bun 44; creat 1.10; k+ 4.7; na++137; ca 8.7 01-28-19: wbc 8.1; hgb 14.9; hct 48.4;  mcv 98.;4 plt 282; glucose 285; bun 38; creat 1.17; k+ 4.3 na++ 143 ca 9.3  01-29-19: glucose 263; bun 39; creat 1.17;  k+ 4.0; na++ 139; ca 9.0; ast 52; alt 59  albumin 2.7 hgb a1c 7.6; chol 116; ldl 50; trig 236; hdl 19   NO NEW LABS.    Review of Systems  Reason unable to perform ROS: nonverbal     Physical Exam Constitutional:      General: He is not in acute distress.    Appearance: He is well-developed. He is not diaphoretic.  Neck:     Thyroid: No thyromegaly.  Cardiovascular:     Rate and Rhythm: Normal rate and regular rhythm.     Pulses: Normal pulses.     Heart sounds: Normal heart sounds.     Comments: Pedal pulses faint  Pulmonary:     Effort: Pulmonary effort is normal. No respiratory distress.     Breath sounds: Normal breath sounds.  Abdominal:     General: Bowel sounds are normal. There is no distension.     Palpations: Abdomen is soft.     Tenderness: There is no abdominal tenderness.     Comments: Peg tube present   Genitourinary:    Comments: Foley  Musculoskeletal:     Right lower leg: No edema.     Left lower leg: No edema.     Comments: Right side hemiparesis Able to move left extremities       Lymphadenopathy:     Cervical: No cervical adenopathy.  Skin:    General: Skin is warm and dry.  Neurological:     Mental Status: He is alert. Mental status is at baseline.  Psychiatric:        Mood and Affect: Mood normal.      ASSESSMENT/ PLAN:  TODAY:   1. Nontraumatic subcortical hemorrhage of left cerebral hemisphere  2. Dysphagia oropharyngeal phase  Will continue  his current plan of care Will continue his current medications  Time spent with patient and wife: 40 minutes: (25 minutes spent with advanced directives); discussed medications; dysphagia; future goal of care; will maintain full code; wife wants everything done. Did verbalize understanding.        MD is aware of resident's narcotic use and is in agreement with current plan  of care. We will attempt to wean resident as apropriate   Ok Edwards NP [facility]  Contact [phone] Monday through Friday 8am- 5pm  After hours call [phone]

## 2019-02-13 NOTE — Procedures (Signed)
Interventional Radiology Procedure:   Indications: Dislodged gastrostomy tube  Procedure: Gastrostomy tube replacement with dilatation.    Findings: 18 Fr tube in place  Complications: None     EBL: Minimal  Plan: Gastrostomy tube is ready to use.     Sera Hitsman R. Anselm Pancoast, MD  Pager: [phone]

## 2019-02-15 ENCOUNTER — Non-Acute Institutional Stay (SKILLED_NURSING_FACILITY): Payer: Medicare Other | Admitting: Adult Health

## 2019-02-15 ENCOUNTER — Encounter: Payer: Self-pay | Admitting: Adult Health

## 2019-02-15 DIAGNOSIS — I10 Essential (primary) hypertension: Secondary | ICD-10-CM

## 2019-02-15 DIAGNOSIS — R1312 Dysphagia, oropharyngeal phase: Secondary | ICD-10-CM | POA: Diagnosis not present

## 2019-02-15 DIAGNOSIS — E1169 Type 2 diabetes mellitus with other specified complication: Secondary | ICD-10-CM

## 2019-02-15 DIAGNOSIS — E785 Hyperlipidemia, unspecified: Secondary | ICD-10-CM

## 2019-02-15 DIAGNOSIS — E1159 Type 2 diabetes mellitus with other circulatory complications: Secondary | ICD-10-CM | POA: Diagnosis not present

## 2019-02-15 DIAGNOSIS — I152 Hypertension secondary to endocrine disorders: Secondary | ICD-10-CM

## 2019-02-15 NOTE — Progress Notes (Signed)
Location:   [place] Room Number: 130 P Place of Service:  SNF (31)   CODE STATUS: Full code  No Known Allergies  Chief Complaint  Patient presents with  . Medical Management of Chronic Issues    Hypertension associated with diabetes; dyslipidemia associated with type 2 diabetes mellitus; dysphagia oropharyngeal phase.     HPI:  He is a 73 year old long term resident of this facility being seen for the management of his chronic illnesses; hypertension; dyslipidemia; dysphagia. There are no reports of aspiration; no reports of agitation; no insomnia; no uncontrolled pain. He has not pulled his peg tube out since going to bolus feedings.   Past Medical History:  Diagnosis Date  . A-fib ([place])    a. Post-op afib after CABG 08/2012.  [name] Acute gastric ulcer with hemorrhage   . Acute respiratory failure with hypoxia ([place])   . Aphasia following cerebral infarction   . Atrial fibrillation ( [place])   . CAD (coronary artery disease)    a. NSTEMI s/p stent to distal RCA 03/2000. b. Inf MI s/p emergent thrombectomy/stenting mid RCA 10/2000. c. NSTEMI s/p CABGx4 (LIMA-LAD, SVG-OM2, seq SVG-acute marginal and PD) 05/20/66 - course complicated by confusion, post-op AF, L pleural effusion with thoracentesis. d. Normal LV function by echo 08/2012.  . Diabetes mellitus   . Diverticulosis   . Dysphagia following cerebral infarction   . Encephalopathy   . Extended spectrum beta lactamase (ESBL) resistance   . Generalized anxiety disorder   . GERD (gastroesophageal reflux disease)   . Hemiplegia and hemiparesis following cerebral infarction affecting right dominant side ([place])   . Hiatal hernia   . HLD (hyperlipidemia)   . HTN (hypertension)   . Non-ST elevation (NSTEMI) myocardial infarction ([place])   . Nontraumatic intracerebral hemorrhage in hemisphere, subcortical ([place])   . Peripheral vascular disease ([place])    a. Carotid dopplers neg 08/13/12. b. Pre-cabg ABIs - R=0.87  suggesting mild dz, L=1.29 possibly falsely elevated due to calcified vessels.  . Pleural effusion    a. L pleural eff after CABG s/p thoracentesis 08/22/12.  . Prostate cancer [facility])    Status post radiation treatment.  . Prostatic hypertrophy    a. Hx of urinary retention, awaiting TURP.  [name] Severe protein-calorie malnutrition ([place])   . Tobacco abuse   . 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 Valvular heart disease    a. Mild  MR by TEE 08/2012.    Past Surgical History:  Procedure Laterality Date  . APPENDECTOMY    . BACK SURGERY    . CORONARY ARTERY BYPASS GRAFT  08/16/2012   Procedure: CORONARY ARTERY BYPASS GRAFTING (CABG);  Surgeon: Melrose Nakayama, MD;  Location: [place];  Service: Open Heart Surgery;  Laterality: N/A;  . IR [place] TUBE CHANGE  02/13/2019  . LEFT HEART CATHETERIZATION WITH CORONARY ANGIOGRAM N/A 08/14/2012   Procedure: LEFT HEART CATHETERIZATION WITH CORONARY ANGIOGRAM;  Surgeon: Peter M Martinique, MD;  Location: [facility] CATH LAB;  Service: Cardiovascular;  Laterality: N/A;    Social History   Socioeconomic History  . Marital status: Married    Spouse name: Not on file  . Number of children: Not on file  . Years of education: Not on file  . Highest education level: Not on file  Occupational History  . Not on file  Social Needs  . Financial resource strain: Not on file  . Food insecurity:    Worry: Not on  file    Inability: Not on file  . Transportation needs:    Medical: Not on file    Non-medical: Not on file  Tobacco Use  . Smoking status: Former Smoker    Last attempt to quit: 11/12/1972    Years since quitting: 46.2  . Smokeless tobacco: Never Used  Substance and Sexual Activity  . Alcohol use: No  . Drug use: No  . Sexual activity: Never  Lifestyle  . Physical activity:    Days per week: Not on file    Minutes per session: Not on file  . Stress: Not on file  Relationships  . Social connections:    Talks on phone: Not on file     Gets together: Not on file    Attends religious service: Not on file    Active member of club or organization: Not on file    Attends meetings of clubs or organizations: Not on file    Relationship status: Not on file  . Intimate partner violence:    Fear of current or ex partner: Not on file    Emotionally abused: Not on file    Physically abused: Not on file    Forced sexual activity: Not on file  Other Topics Concern  . Not on file  Social History Narrative  . Not on file   History reviewed. No pertinent family history.    VITAL SIGNS BP 107/66   Pulse 78   Temp 97.9 F (36.6 C)   Resp 18   Ht 5\' 11"  (1.803 m)   Wt 168 lb 6.4 oz (76.4 kg)   BMI 23.49 kg/m   Outpatient Encounter Medications as of 02/15/2019  Medication Sig  . Amino Acids-Protein Hydrolys (FEEDING SUPPLEMENT, PRO-STAT SUGAR FREE 64,) LIQD Place 30 mLs into feeding tube daily.   [name] amLODipine (NORVASC) 5 MG tablet Place 5 mg into feeding tube daily.  [name] atorvastatin (LIPITOR) 40 MG tablet Place 40 mg into feeding tube at bedtime.   . baclofen (LIORESAL) 10 MG tablet 1 tablet by Gastric Tube route 2 (two) times daily.   Roseanne Kaufman [place]-Castor Oil (VENELEX) OINT Apply topically to sacrum, coccyx and bilateral buttocks every shift and as needed for prevention  . Dimethicone 1.3 % LOTN Apply 1 application topically See admin instructions. Apply to the extremities and back daily and as needed for itching /dry skin. Do not apply to buttocks wound.  . diphenoxylate-atropine (LOMOTIL) 2.5-0.025 MG tablet Place 2 tablets into feeding tube every 4 (four) hours as needed for diarrhea or loose stools.  [name] FLUoxetine (PROZAC) 20 MG/5ML solution Place 20 mg into feeding tube daily.   [name] GLUCERNA (GLUCERNA) LIQD Glucerna 1.2 Cal liquid; 0.06-1.2 gram-kcal/ml - Give 1 can four times daily.  Please flush with 30 cc before / after to help maintain patency  . hydrocortisone cream (PREPARATION H) 1 % Apply rectally twice daily and as  needed for hemorrhoids  . insulin aspart (NOVOLOG FLEXPEN) 100 UNIT/ML FlexPen Give subcutaneous per sliding scale every 6 hours; If blood sugars are less than 60, CALL MD 200-250= 4 units 251-300= 6 units 301-351= 8 units 351-400= 10 units If Blood sugar is greater than 400, call MD  . Insulin Glargine (LANTUS SOLOSTAR) 100 UNIT/ML Solostar Pen Inject 30 Units into the skin daily.   . lansoprazole (PREVACID SOLUTAB) 30 MG disintegrating tablet Place 30 mg into feeding tube 2 (two) times daily.  [name] LORazepam (ATIVAN) 0.5 MG tablet Place 0.5 mg into feeding  tube every 6 (six) hours as needed for anxiety.  . Melatonin 1 MG TABS 2 tablets by Gastric Tube route at bedtime.   . metoprolol tartrate (LOPRESSOR) 25 MG tablet Place 12.5 mg into feeding tube 2 (two) times daily.  . NON FORMULARY Diet Type:   NPO  . oseltamivir (TAMIFLU) 75 MG capsule Place 75 mg into feeding tube at bedtime.   Oneta Rack Supplies (SKIN PREP SPRAY) MISC Apply to bilateral heels every shift for prevention  . QUEtiapine (SEROQUEL) 25 MG tablet Place 12.5 mg into feeding tube at bedtime.  . Water For Irrigation, Sterile (STERILE WATER FOR IRRIGATION) Flush with 240 cc every 6 hours  . [DISCONTINUED] Wound Dressings (HYDROGEL EX) Apply hydrogel moistened gauze and cover with coverderm dressing.  Change daily and as needed   No facility-administered encounter medications on file as of 02/15/2019.      SIGNIFICANT DIAGNOSTIC EXAMS   LABS REVIEWED PREVIOUS:   01-04-19: urine culture: klebsiella oxytoca  01-16-19: wbc 10.1; hgb 13.9; hct 45.2; mcv 96.2; plt 287; glucose 371; bun 44; creat 1.10; k+ 4.7; na++137; ca 8.7 01-28-19: wbc 8.1; hgb 14.9; hct 48.4; mcv 98.;4 plt 282; glucose 285; bun 38; creat 1.17; k+ 4.3 na++ 143 ca 9.3  01-29-19: glucose 263; bun 39; creat 1.17;  k+ 4.0; na++ 139; ca 9.0; ast 52; alt 59  albumin 2.7 hgb a1c 7.6; chol 116; ldl 50; trig 236; hdl 19   NO NEW LABS.    Review of Systems  Reason unable  to perform ROS: nonverbal     Physical Exam Constitutional:      General: He is not in acute distress.    Appearance: He is well-developed. He is not diaphoretic.  Neck:     Thyroid: No thyromegaly.  Cardiovascular:     Rate and Rhythm: Normal rate and regular rhythm.     Heart sounds: Normal heart sounds.     Comments: Pedal pulses faint Pulmonary:     Effort: Pulmonary effort is normal. No respiratory distress.     Breath sounds: Normal breath sounds.  Abdominal:     General: Bowel sounds are normal. There is no distension.     Palpations: Abdomen is soft.     Tenderness: There is no abdominal tenderness.     Comments: Peg tube present   Genitourinary:    Comments: Foley  Musculoskeletal:     Right lower leg: No edema.     Left lower leg: No edema.     Comments:  Right side hemiparesis Able to move left extremities     Lymphadenopathy:     Cervical: No cervical adenopathy.  Skin:    General: Skin is warm and dry.  Neurological:     Mental Status: He is alert. Mental status is at baseline.  Psychiatric:        Mood and Affect: Mood normal.      ASSESSMENT/ PLAN:  TODAY:   1. Hypertension associated with diabetes: is stable b/p 107/66 will continue norvasc 5 mg daily and lopressor 12.5 mg twice daily   2.  Dyslipidemia associated with type 2 diabetes mellitus: is stable will continue lipitor 80 mg daily   3. Dysphagia oropharyngeal phase: no signs of aspiration present: is peg tube dependent: is on bolus tube feeding; no report of him pulling out peg tube    PREVIOUS:   4. Protein calorie malnutrition; severe: is without change weight is stable 162 pounds albumin 2.7 will continue prostat 30 cc  three times daily   5. Dyslipidemia associated with type 2 diabetes mellitus: is stable ldl 50  will continue lipitor  40 mg daily  6. Type 2 diabetes mellitus with peripheral vascular disease: is stable hgb a1c 7.6 will continue lantus 30 units nightly and will  continue novolog SSI: 200-250: 4 units; 251-300: 6 units; 301-350: 8 units; 351-400: 10 units   7. Nontraumatic subcortical hemorrhage of left cerebral hemisphere/ hemorrhagic cerebrovascular accident  is without change: will continue baclofen 5 mg twice daily for spasticity   8. Major depression with psychotic features: is stable will continue prozac 20 mg daily; seroquel 12.5 mg nightly has ativan 0.5 mg every 6 hours as needed for total 14 days.   9. CAD: native artery; native heart without angina: is status post CABG: 2013: is stable will monitor    MD is aware of resident's narcotic use and is in agreement with current plan of care. We will attempt to wean resident as apropriate   Ok Edwards NP [facility]  Contact [phone] Monday through Friday 8am- 5pm  After hours call [phone]

## 2019-02-18 ENCOUNTER — Encounter (HOSPITAL_COMMUNITY): Payer: Self-pay

## 2019-02-22 ENCOUNTER — Encounter: Payer: Self-pay | Admitting: Adult Health

## 2019-02-22 ENCOUNTER — Non-Acute Institutional Stay (SKILLED_NURSING_FACILITY): Payer: Medicare Other | Admitting: Adult Health

## 2019-02-22 DIAGNOSIS — I619 Nontraumatic intracerebral hemorrhage, unspecified: Secondary | ICD-10-CM

## 2019-02-22 DIAGNOSIS — E43 Unspecified severe protein-calorie malnutrition: Secondary | ICD-10-CM

## 2019-02-22 DIAGNOSIS — I610 Nontraumatic intracerebral hemorrhage in hemisphere, subcortical: Secondary | ICD-10-CM | POA: Diagnosis not present

## 2019-02-22 DIAGNOSIS — E1151 Type 2 diabetes mellitus with diabetic peripheral angiopathy without gangrene: Secondary | ICD-10-CM

## 2019-02-22 NOTE — Progress Notes (Signed)
Location:    [facility] Room Number: 130/P Place of Service:  SNF (31)   CODE STATUS: Full Code  No Known Allergies  Chief Complaint  Patient presents with  . Medical Management of Chronic Issues    Type 2 diabetes mellitus with peripheral vascular disease; nontraumatic subcortical hemorrhage of left cerebral hemisphere; hemorrhage cerebrovascular accident; protein calorie malnutrition severe.     HPI:  He is a 73 year old long term resident of this facility being seen for the management of his chronic illnesses: diabetes; cva; protein calorie malnutrition. He has not had any further attempts to pull out his peg tube. He is tolerating the sequel dose easily; there are no reports of agitation or anxiety present. There are no reports of uncontrolled pain. He is getting out of bed daily to wheelchair.   Past Medical History:  Diagnosis Date  . A-fib ([place])    a. Post-op afib after CABG 08/2012.  [name] Acute gastric ulcer with hemorrhage   . Acute respiratory failure with hypoxia ([place])   . Aphasia following cerebral infarction   . Atrial fibrillation ([place])   . CAD (coronary artery disease)    a. NSTEMI s/p stent to distal RCA 03/2000. b. Inf MI s/p emergent thrombectomy/stenting mid RCA 10/2000. c. NSTEMI s/p CABGx4 (LIMA-LAD, SVG-OM2, seq SVG-acute marginal and PD) 04/13/65 - course complicated by confusion, post-op AF, L pleural effusion with thoracentesis. d. Normal LV function by echo 08/2012.  . Diabetes mellitus   . Diverticulosis   . Dysphagia following cerebral infarction   . Encephalopathy   . Extended spectrum beta lactamase (ESBL) resistance   . Generalized anxiety disorder   . GERD (gastroesophageal reflux disease)   . Hemiplegia and hemiparesis following cerebral infarction affecting right dominant side ([place])   . Hiatal hernia   . HLD (hyperlipidemia)   . HTN (hypertension)   . Non-ST elevation (NSTEMI) myocardial infarction ([place])   . Nontraumatic  intracerebral hemorrhage in hemisphere, subcortical ([place])   . Peripheral vascular disease ([place])    a. Carotid dopplers neg 08/13/12. b. Pre-cabg ABIs - R=0.87 suggesting mild dz, L=1.29 possibly falsely elevated due to calcified vessels.  . Pleural effusion    a. L pleural eff after CABG s/p thoracentesis 08/22/12.  . Prostate cancer [facility])    Status post radiation treatment.  . Prostatic hypertrophy    a. Hx of urinary retention, awaiting TURP.  [name] Severe protein-calorie malnutrition ([place])   . Tobacco abuse   . 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 Valvular heart disease    a. Mild  MR by TEE 08/2012.    Past Surgical History:  Procedure Laterality Date  . APPENDECTOMY    . BACK SURGERY    . CORONARY ARTERY BYPASS GRAFT  08/16/2012   Procedure: CORONARY ARTERY BYPASS GRAFTING (CABG);  Surgeon: Melrose Nakayama, MD;  Location: [facility];  Service: Open Heart Surgery;  Laterality: N/A;  . IR REPLC GASTRO/COLONIC TUBE PERCUT W/FLUORO  02/13/2019  . LEFT HEART CATHETERIZATION WITH CORONARY ANGIOGRAM N/A 08/14/2012   Procedure: LEFT HEART CATHETERIZATION WITH CORONARY ANGIOGRAM;  Surgeon: Peter M Martinique, MD;  Location: [facility] CATH LAB;  Service: Cardiovascular;  Laterality: N/A;    Social History   Socioeconomic History  . Marital status: Married    Spouse name: Not on file  . Number of children: Not on file  . Years of education: Not on file  . Highest education level: Not on file  Occupational History  . Not on file  Social Needs  . Financial resource strain: Not on file  . Food insecurity:    Worry: Not on file    Inability: Not on file  . Transportation needs:    Medical: Not on file    Non-medical: Not on file  Tobacco Use  . Smoking status: Former Smoker    Last attempt to quit: 11/12/1972    Years since quitting: 46.3  . Smokeless tobacco: Never Used  Substance and Sexual Activity  . Alcohol use: No  . Drug use: No  . Sexual activity: Never  Lifestyle  .  Physical activity:    Days per week: Not on file    Minutes per session: Not on file  . Stress: Not on file  Relationships  . Social connections:    Talks on phone: Not on file    Gets together: Not on file    Attends religious service: Not on file    Active member of club or organization: Not on file    Attends meetings of clubs or organizations: Not on file    Relationship status: Not on file  . Intimate partner violence:    Fear of current or ex partner: Not on file    Emotionally abused: Not on file    Physically abused: Not on file    Forced sexual activity: Not on file  Other Topics Concern  . Not on file  Social History Narrative  . Not on file   History reviewed. No pertinent family history.    VITAL SIGNS BP 109/66   Pulse 78   Temp 98.1 F (36.7 C) (Oral)   Resp 18   Ht 5\' 11"  (1.803 m)   Wt 165 lb (74.8 kg)   BMI 23.01 kg/m   Outpatient Encounter Medications as of 02/22/2019  Medication Sig  . Amino Acids-Protein Hydrolys (FEEDING SUPPLEMENT, PRO-STAT SUGAR FREE 64,) LIQD Place 30 mLs into feeding tube daily.   [name] amLODipine (NORVASC) 5 MG tablet Place 5 mg into feeding tube daily.  [name] atorvastatin (LIPITOR) 40 MG tablet Place 40 mg into feeding tube at bedtime.   . baclofen (LIORESAL) 10 MG tablet 1 tablet by Gastric Tube route 2 (two) times daily.   Roseanne Kaufman [place]-Castor Oil (VENELEX) OINT Apply topically to sacrum, coccyx and bilateral buttocks every shift and as needed for prevention  . Dimethicone 1.3 % LOTN Apply 1 application topically See admin instructions. Apply to the extremities and back daily and as needed for itching /dry skin. Do not apply to buttocks wound.  . diphenoxylate-atropine (LOMOTIL) 2.5-0.025 MG tablet Place 2 tablets into feeding tube every 4 (four) hours as needed for diarrhea or loose stools.  [name] FLUoxetine (PROZAC) 20 MG/5ML solution Place 20 mg into feeding tube daily.   [name] GLUCERNA (GLUCERNA) LIQD Glucerna 1.2 Cal liquid; 0.06-1.2  gram-kcal/ml - Give 1 can four times daily.  Please flush with 30 cc before / after to help maintain patency  . hydrocortisone cream (PREPARATION H) 1 % Apply rectally twice daily and as needed for hemorrhoids  . insulin aspart (NOVOLOG FLEXPEN) 100 UNIT/ML FlexPen Give subcutaneous per sliding scale every 6 hours; If blood sugars are less than 60, CALL MD 200-250= 4 units 251-300= 6 units 301-351= 8 units 351-400= 10 units If Blood sugar is greater than 400, call MD  . Insulin Glargine (LANTUS SOLOSTAR) 100 UNIT/ML Solostar Pen Inject 30 Units into the skin daily.   . lansoprazole (PREVACID  SOLUTAB) 30 MG disintegrating tablet Place 30 mg into feeding tube 2 (two) times daily.  [name] LORazepam (ATIVAN) 0.5 MG tablet Place 0.5 mg into feeding tube every 6 (six) hours as needed for anxiety.  . Melatonin 1 MG TABS 2 tablets by Gastric Tube route at bedtime.   . metoprolol tartrate (LOPRESSOR) 25 MG tablet Place 12.5 mg into feeding tube 2 (two) times daily.  . NON FORMULARY Diet Type:   NPO  . Ostomy Supplies (SKIN PREP SPRAY) MISC Apply to bilateral heels every shift for prevention  . QUEtiapine (SEROQUEL) 25 MG tablet Place 12.5 mg into feeding tube at bedtime.  . Water For Irrigation, Sterile (STERILE WATER FOR IRRIGATION) Flush with 240 cc every 6 hours   No facility-administered encounter medications on file as of 02/22/2019.      SIGNIFICANT DIAGNOSTIC EXAMS   LABS REVIEWED PREVIOUS:   01-04-19: urine culture: klebsiella oxytoca  01-16-19: wbc 10.1; hgb 13.9; hct 45.2; mcv 96.2; plt 287; glucose 371; bun 44; creat 1.10; k+ 4.7; na++137; ca 8.7 01-28-19: wbc 8.1; hgb 14.9; hct 48.4; mcv 98.;4 plt 282; glucose 285; bun 38; creat 1.17; k+ 4.3 na++ 143 ca 9.3  01-29-19: glucose 263; bun 39; creat 1.17;  k+ 4.0; na++ 139; ca 9.0; ast 52; alt 59  albumin 2.7 hgb a1c 7.6; chol 116; ldl 50; trig 236; hdl 19   NO NEW LABS.   Review of Systems  Reason unable to perform ROS: expressive aphasia     Physical Exam Constitutional:      General: He is not in acute distress.    Appearance: He is well-developed. He is not diaphoretic.  Neck:     Musculoskeletal: Neck supple.     Thyroid: No thyromegaly.  Cardiovascular:     Rate and Rhythm: Normal rate and regular rhythm.     Pulses: Normal pulses.     Heart sounds: Normal heart sounds.  Pulmonary:     Effort: Pulmonary effort is normal. No respiratory distress.     Breath sounds: Normal breath sounds.  Abdominal:     General: Bowel sounds are normal. There is no distension.     Palpations: Abdomen is soft.     Tenderness: There is no abdominal tenderness.     Comments: Peg tube present   Genitourinary:    Comments: Foley  Musculoskeletal:     Right lower leg: No edema.     Left lower leg: No edema.     Comments: Right side hemiparesis Able to move left extremities      Lymphadenopathy:     Cervical: No cervical adenopathy.  Skin:    General: Skin is warm and dry.  Neurological:     Mental Status: He is alert. Mental status is at baseline.  Psychiatric:        Mood and Affect: Mood normal.      ASSESSMENT/ PLAN:  TODAY:    1. Protein calorie malnutrition severe: is without change weight is stable at 165 pounds; albumin is 2.7 will continue prostat 30 cc daily   2. Type 2 diabetes mellitus with peripheral vascular disease: is stable hgb a1c 7.6; will continue lantus 30 units nightly with novolog SSI: 200-250: 4 units; 251-300: 6 units; 301-350: 8 units; 351 -400: 10 units  3. Nontraumatic subcortical hemorrhage of left cerebral hemisphere/hemorrhagic cerebrovascular accident is stable neurologically will continue baclofen 10 mg twice daily for spasticity.   PREVIOUS:   4. Major depression with psychotic features: is stable will  continue prozac 20 mg daily; seroquel 12.5 mg nightly ( has failed coming off sequel in the past)  has ativan 0.5 mg every 6 hours as needed for total 14 days.   5. CAD: native artery;  native heart without angina: is status post CABG: 2013: is stable will monitor   6. Hypertension associated with diabetes: is stable b/p 109/66 will continue norvasc 5 mg daily and lopressor 12.5 mg twice daily   7.  Dyslipidemia associated with type 2 diabetes mellitus: is stable ldl 50  will continue lipitor 80 mg daily   8. Dysphagia oropharyngeal phase: no signs of aspiration present: is peg tube dependent: is on bolus tube feeding; ST to begin him on pureed with honey thick liquids   9. Chronic urine retention: is stable requires foley is followed by urology      MD is aware of resident's narcotic use and is in agreement with current plan of care. We will attempt to wean resident as apropriate   Ok Edwards NP [facility]  Contact [phone] Monday through Friday 8am- 5pm  After hours call [phone]

## 2019-02-26 ENCOUNTER — Ambulatory Visit: Payer: Medicare Other | Admitting: Neurology

## 2019-02-26 ENCOUNTER — Encounter: Payer: Self-pay | Admitting: Adult Health

## 2019-02-26 ENCOUNTER — Non-Acute Institutional Stay (SKILLED_NURSING_FACILITY): Payer: Medicare Other | Admitting: Adult Health

## 2019-02-26 DIAGNOSIS — F323 Major depressive disorder, single episode, severe with psychotic features: Secondary | ICD-10-CM

## 2019-02-26 NOTE — Progress Notes (Signed)
Location:    [place] Room Number: 130/P Place of Service:  SNF (31)   CODE STATUS: Full Code  No Known Allergies  Chief Complaint  Patient presents with  . Acute Visit    Anxiety Management    HPI:  He had been pulling out his peg tube when his seroquel was stopped. This medication was restarted at 12.5 mg twice daily now at bedtime only. He was placed on prn ativan until the seroquel becomes effective. He has not required any prn dosing. He is more calm; no reports of agitation; no further pulling out of his peg tube. Will need to stop the ativan at this time.   Past Medical History:  Diagnosis Date  . A-fib ([place])    a. Post-op afib after CABG 08/2012.  [name] Acute gastric ulcer with hemorrhage   . Acute respiratory failure with hypoxia ([place])   . Aphasia following cerebral infarction   . Atrial fibrillation ([place])   . CAD (coronary artery disease)    a. NSTEMI s/p stent to distal RCA 03/2000. b. Inf MI s/p emergent thrombectomy/stenting mid RCA 10/2000. c. NSTEMI s/p CABGx4 (LIMA-LAD, SVG-OM2, seq SVG-acute marginal and PD) 06/16/72 - course complicated by confusion, post-op AF, L pleural effusion with thoracentesis. d. Normal LV function by echo 08/2012.  . Diabetes mellitus   . Diverticulosis   . Dysphagia following cerebral infarction   . Encephalopathy   . Extended spectrum beta lactamase (ESBL) resistance   . Generalized anxiety disorder   . GERD (gastroesophageal reflux disease)   . Hemiplegia and hemiparesis following cerebral infarction affecting right dominant side ([place])   . Hiatal hernia   . HLD (hyperlipidemia)   . HTN (hypertension)   . Non-ST elevation (NSTEMI) myocardial infarction ([place])   . Nontraumatic intracerebral hemorrhage in hemisphere, subcortical ([place])   . Peripheral vascular disease ([place])    a. Carotid dopplers neg 08/13/12. b. Pre-cabg ABIs - R=0.87 suggesting mild dz, L=1.29 possibly falsely elevated due to calcified vessels.  .  Pleural effusion    a. L pleural eff after CABG s/p thoracentesis 08/22/12.  . Prostate cancer [facility])    Status post radiation treatment.  . Prostatic hypertrophy    a. Hx of urinary retention, awaiting TURP.  [name] Severe protein-calorie malnutrition ([place])   . Tobacco abuse   . 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 Valvular heart disease    a. Mild  MR by TEE 08/2012.    Past Surgical History:  Procedure Laterality Date  . APPENDECTOMY    . BACK SURGERY    . CORONARY ARTERY BYPASS GRAFT  08/16/2012   Procedure: CORONARY ARTERY BYPASS GRAFTING (CABG);  Surgeon: Melrose Nakayama, MD;  Location: [place];  Service: Open Heart Surgery;  Laterality: N/A;  . IR REPLC GASTRO/COLONIC TUBE PERCUT W/FLUORO  02/13/2019  . LEFT HEART CATHETERIZATION WITH CORONARY ANGIOGRAM N/A 08/14/2012   Procedure: LEFT HEART CATHETERIZATION WITH CORONARY ANGIOGRAM;  Surgeon: Peter M Martinique, MD;  Location: [facility] CATH LAB;  Service: Cardiovascular;  Laterality: N/A;    Social History   Socioeconomic History  . Marital status: Married    Spouse name: Not on file  . Number of children: Not on file  . Years of education: Not on file  . Highest education level: Not on file  Occupational History  . Not on file  Social Needs  . Financial resource strain: Not on file  . Food insecurity:    Worry:  Not on file    Inability: Not on file  . Transportation needs:    Medical: Not on file    Non-medical: Not on file  Tobacco Use  . Smoking status: Former Smoker    Last attempt to quit: 11/12/1972    Years since quitting: 46.3  . Smokeless tobacco: Never Used  Substance and Sexual Activity  . Alcohol use: No  . Drug use: No  . Sexual activity: Never  Lifestyle  . Physical activity:    Days per week: Not on file    Minutes per session: Not on file  . Stress: Not on file  Relationships  . Social connections:    Talks on phone: Not on file    Gets together: Not on file    Attends religious  service: Not on file    Active member of club or organization: Not on file    Attends meetings of clubs or organizations: Not on file    Relationship status: Not on file  . Intimate partner violence:    Fear of current or ex partner: Not on file    Emotionally abused: Not on file    Physically abused: Not on file    Forced sexual activity: Not on file  Other Topics Concern  . Not on file  Social History Narrative  . Not on file   History reviewed. No pertinent family history.    VITAL SIGNS There were no vitals taken for this visit.  Outpatient Encounter Medications as of 02/26/2019  Medication Sig  . Amino Acids-Protein Hydrolys (FEEDING SUPPLEMENT, PRO-STAT SUGAR FREE 64,) LIQD Place 30 mLs into feeding tube daily.   [name] amLODipine (NORVASC) 5 MG tablet Place 5 mg into feeding tube daily.  [name] atorvastatin (LIPITOR) 40 MG tablet Place 40 mg into feeding tube at bedtime.   . baclofen (LIORESAL) 10 MG tablet 1 tablet by Gastric Tube route 2 (two) times daily.   Roseanne Kaufman [place]-Castor Oil (VENELEX) OINT Apply topically to sacrum, coccyx and bilateral buttocks every shift and as needed for prevention  . diphenoxylate-atropine (LOMOTIL) 2.5-0.025 MG tablet Place 2 tablets into feeding tube every 4 (four) hours as needed for diarrhea or loose stools.  [name] FLUoxetine (PROZAC) 20 MG/5ML solution Place 20 mg into feeding tube daily.   [name] GLUCERNA (GLUCERNA) LIQD Glucerna 1.5 Cal liquid; 0.08-1.5 gram-kcal/ml - Give 1 can four times daily.  Mix each bolus with 240 cc (8oz) free water to help w/admin  . hydrocortisone cream (PREPARATION H) 1 % Apply rectally twice daily and as needed for hemorrhoids  . insulin aspart (NOVOLOG FLEXPEN) 100 UNIT/ML FlexPen Give subcutaneous per sliding scale every 6 hours; If blood sugars are less than 60, CALL MD 200-250= 4 units 251-300= 6 units 301-351= 8 units 351-400= 10 units If Blood sugar is greater than 400, call MD  . Insulin Glargine (LANTUS SOLOSTAR) 100  UNIT/ML Solostar Pen Inject 30 Units into the skin daily.   . lansoprazole (PREVACID SOLUTAB) 30 MG disintegrating tablet Place 30 mg into feeding tube 2 (two) times daily.  [name] LORazepam (ATIVAN) 0.5 MG tablet Place 0.5 mg into feeding tube every 6 (six) hours as needed for anxiety.  . Melatonin 1 MG TABS 2 tablets by Gastric Tube route at bedtime.   . metoprolol tartrate (LOPRESSOR) 25 MG tablet Place 12.5 mg into feeding tube 2 (two) times daily.  . NON FORMULARY Diet Type:   NPO  . Ostomy Supplies (SKIN PREP SPRAY) MISC Apply to bilateral  heels every shift for prevention  . Pramoxine-Calamine (AVEENO ANTI-ITCH) 1-3 % LOTN Apply to the extremities and back daily and prn for itching/ dry skin. Do not apply to the buttocks wound  . QUEtiapine (SEROQUEL) 25 MG tablet Place 12.5 mg into feeding tube at bedtime.  . Water For Irrigation, Sterile (STERILE WATER FOR IRRIGATION) Flush with 240 cc every 6 hours   No facility-administered encounter medications on file as of 02/26/2019.      SIGNIFICANT DIAGNOSTIC EXAMS   LABS REVIEWED PREVIOUS:   01-04-19: urine culture: klebsiella oxytoca  01-16-19: wbc 10.1; hgb 13.9; hct 45.2; mcv 96.2; plt 287; glucose 371; bun 44; creat 1.10; k+ 4.7; na++137; ca 8.7 01-28-19: wbc 8.1; hgb 14.9; hct 48.4; mcv 98.;4 plt 282; glucose 285; bun 38; creat 1.17; k+ 4.3 na++ 143 ca 9.3  01-29-19: glucose 263; bun 39; creat 1.17;  k+ 4.0; na++ 139; ca 9.0; ast 52; alt 59  albumin 2.7 hgb a1c 7.6; chol 116; ldl 50; trig 236; hdl 19   NO NEW LABS.   Review of Systems  Reason unable to perform ROS: expressive aphasia     Physical Exam Constitutional:      General: He is not in acute distress.    Appearance: He is well-developed. He is not diaphoretic.  Neck:     Musculoskeletal: Neck supple.     Thyroid: No thyromegaly.  Cardiovascular:     Rate and Rhythm: Normal rate and regular rhythm.     Pulses: Normal pulses.     Heart sounds: Normal heart sounds.  Pulmonary:      Effort: Pulmonary effort is normal. No respiratory distress.     Breath sounds: Normal breath sounds.  Abdominal:     General: Bowel sounds are normal. There is no distension.     Palpations: Abdomen is soft.     Tenderness: There is no abdominal tenderness.     Comments: Peg tube present   Genitourinary:    Comments: Foley  Musculoskeletal:     Right lower leg: No edema.     Left lower leg: No edema.     Comments: Right side hemiparesis Able to move left extremities     Lymphadenopathy:     Cervical: No cervical adenopathy.  Skin:    General: Skin is warm and dry.  Neurological:     Mental Status: He is alert. Mental status is at baseline.  Psychiatric:        Mood and Affect: Mood normal.      ASSESSMENT/ PLAN:  TODAY:   1. Major depression with psychotic features: is doing well: will continue prozac 20 mg daily and seroquel 12.5 mg nightly will stop ativan due to non-use and will monitor his status.   MD is aware of resident's narcotic use and is in agreement with current plan of care. We will attempt to wean resident as apropriate   Ok Edwards NP [facility]  Contact [phone] Monday through Friday 8am- 5pm  After hours call [phone]

## 2019-03-08 ENCOUNTER — Encounter: Payer: Self-pay | Admitting: Internal Medicine

## 2019-03-08 ENCOUNTER — Non-Acute Institutional Stay (SKILLED_NURSING_FACILITY): Payer: Medicare Other | Admitting: Internal Medicine

## 2019-03-08 DIAGNOSIS — I10 Essential (primary) hypertension: Secondary | ICD-10-CM

## 2019-03-08 DIAGNOSIS — R1312 Dysphagia, oropharyngeal phase: Secondary | ICD-10-CM | POA: Diagnosis not present

## 2019-03-08 DIAGNOSIS — E1151 Type 2 diabetes mellitus with diabetic peripheral angiopathy without gangrene: Secondary | ICD-10-CM | POA: Diagnosis not present

## 2019-03-08 DIAGNOSIS — E1159 Type 2 diabetes mellitus with other circulatory complications: Secondary | ICD-10-CM

## 2019-03-08 DIAGNOSIS — I152 Hypertension secondary to endocrine disorders: Secondary | ICD-10-CM

## 2019-03-08 NOTE — Progress Notes (Signed)
Location:  [facility] Room Number: 130 P Place of Service:  SNF (31) Provider:  Veleta Miners, MD  No primary care provider on file.  Patient Care Team: [name], MD as Consulting Physician (Urology)  Extended Emergency Contact Information Primary Emergency Contact: Metter,[name] Address: [address]          [place] [address] [name] of [place] Phone: [phone] Mobile Phone: [phone] Relation: Spouse Secondary Emergency Contact: [name] of [place] Phone: [phone] Relation: Son  Code Status:  Full Code Goals of care: Advanced Directive information Advanced Directives 03/08/2019  Does Patient Have a Medical Advance Directive? Yes  Type of Advance Directive -  Does patient want to make changes to medical advance directive? No - Patient declined  Would patient like information on creating a medical advance directive? No - Patient declined  Pre-existing out of facility DNR order (yellow form or pink MOST form) Pink MOST form placed in chart (order not valid for inpatient use)     Chief Complaint  Patient presents with   Acute Visit    DM, Tube feedings    HPI:  Pt is a 73 y.o. male seen today for an acute visit as Nurses and dietician wants to see if patient can be taken off his Tube feeds.  Patient has been in SNF for therapy after staying in the [facility] hospital from 12/08-01/10 with Acutenontraumatic subcortical hemorrhage of left hemisphere. His stay was complicated by Aspiration Pneumonia requiring PEG tube placement, Urinary Retention with Foley catheter, Sacral Pressure wound Gastric ulcer and Restlessness. Proctitis and ESBL Bacteremia  Patient is not eating well and finishing 100 % of his meals.  They continue to give him some tube feed at night.  Per nutrition he can go off his tube feeds. Patient also is diabetic and is on Lantus and sliding scale insulin while she he was on tube  feedings. On reviewing his blood sugars he is running less than 200 in the morning and has some peaks mostly in the evenings. Patient unable to give me any history he continues to have aphasia but is responding more and does follow some commands.  Therapy is not working with him anymore.  He continues to be fall risk and is dependent for his ADLs  Past Medical History:  Diagnosis Date   A-fib [facility])    a. Post-op afib after CABG 08/2012.   Acute gastric ulcer with hemorrhage    Acute respiratory failure with hypoxia (HCC)    Aphasia following cerebral infarction    Atrial fibrillation (HCC)    CAD (coronary artery disease)    a. NSTEMI s/p stent to distal RCA 03/2000. b. Inf MI s/p emergent thrombectomy/stenting mid RCA 10/2000. c. NSTEMI s/p CABGx4 (LIMA-LAD, SVG-OM2, seq SVG-acute marginal and PD) 12/19/82 - course complicated by confusion, post-op AF, L pleural effusion with thoracentesis. d. Normal LV function by echo 08/2012.   Diabetes mellitus    Diverticulosis    Dysphagia following cerebral infarction    Encephalopathy    Extended spectrum beta lactamase (ESBL) resistance    Generalized anxiety disorder    GERD (gastroesophageal reflux disease)    Hemiplegia and hemiparesis following cerebral infarction affecting right dominant side (HCC)    Hiatal hernia    HLD (hyperlipidemia)    HTN (hypertension)    Non-ST elevation (NSTEMI) myocardial infarction [facility])    Nontraumatic intracerebral hemorrhage in hemisphere, subcortical ([place])    Peripheral vascular disease ([place])  a. Carotid dopplers neg 08/13/12. b. Pre-cabg ABIs - R=0.87 suggesting mild dz, L=1.29 possibly falsely elevated due to calcified vessels.   Pleural effusion    a. L pleural eff after CABG s/p thoracentesis 08/22/12.   Prostate cancer [facility])    Status post radiation treatment.   Prostatic hypertrophy    a. Hx of urinary retention, awaiting TURP.   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 Mild  MR by TEE 08/2012.   Past Surgical History:  Procedure Laterality Date   APPENDECTOMY     BACK SURGERY     CORONARY ARTERY BYPASS GRAFT  08/16/2012   Procedure: CORONARY ARTERY BYPASS GRAFTING (CABG);  Surgeon: Melrose Nakayama, MD;  Location: [place];  Service: Open Heart Surgery;  Laterality: N/A;   IR REPLC GASTRO/COLONIC TUBE PERCUT W/FLUORO  02/13/2019   LEFT HEART CATHETERIZATION WITH CORONARY ANGIOGRAM N/A 08/14/2012   Procedure: LEFT HEART CATHETERIZATION WITH CORONARY ANGIOGRAM;  Surgeon: Peter M Martinique, MD;  Location: [facility] CATH LAB;  Service: Cardiovascular;  Laterality: N/A;    No Known Allergies  Outpatient Encounter Medications as of 03/08/2019  Medication Sig   Amino Acids-Protein Hydrolys (FEEDING SUPPLEMENT, PRO-STAT SUGAR FREE 64,) LIQD Place 30 mLs into feeding tube daily.    amLODipine (NORVASC) 5 MG tablet Place 5 mg into feeding tube daily.   atorvastatin (LIPITOR) 40 MG tablet Place 40 mg into feeding tube at bedtime.    baclofen (LIORESAL) 10 MG tablet 1 tablet by Gastric Tube route 2 (two) times daily.    Balsam [place]-Castor Oil (VENELEX) OINT Apply topically to sacrum, coccyx and bilateral buttocks every shift and as needed for prevention   diphenoxylate-atropine (LOMOTIL) 2.5-0.025 MG tablet Place 2 tablets into feeding tube every 4 (four) hours as needed for diarrhea or loose stools.   FLUoxetine (PROZAC) 20 MG/5ML solution Place 20 mg into feeding tube daily.    GLUCERNA (GLUCERNA) LIQD Glucerna 1.2 Cal liquid; 0.08-1.5 gram-kcal/ml - Give 1.5 (356 mls)  four times daily.  Mix each bolus with 240 cc (8oz) free water to help w/admin Hold meal time boluses (0900, 1400, 1900) if eats > 50% of preceding meal   hydrocortisone cream (PREPARATION H) 1 % Apply rectally twice daily and as needed for hemorrhoids   insulin aspart (NOVOLOG FLEXPEN) 100  UNIT/ML FlexPen Give subcutaneous per sliding scale every 6 hours; If blood sugars are less than 60, CALL MD 200-250= 4 units 251-300= 6 units 301-351= 8 units 351-400= 10 units If Blood sugar is greater than 400, call MD   Insulin Glargine (LANTUS SOLOSTAR) 100 UNIT/ML Solostar Pen Inject 30 Units into the skin daily.    lansoprazole (PREVACID SOLUTAB) 30 MG disintegrating tablet Place 30 mg into feeding tube 2 (two) times daily.   Melatonin 1 MG TABS 2 tablets by Gastric Tube route at bedtime.    metoprolol tartrate (LOPRESSOR) 25 MG tablet Place 12.5 mg into feeding tube 2 (two) times daily.   NON FORMULARY Diet Type:   Initiate puree consistency diet with honey thick liquid   Ostomy Supplies (SKIN PREP SPRAY) MISC Apply to bilateral heels every shift for prevention   Pramoxine-Calamine (AVEENO ANTI-ITCH) 1-3 % LOTN Apply to the extremities and back daily and prn for itching/ dry skin. Do not apply to the buttocks wound   QUEtiapine (SEROQUEL) 25 MG tablet Place 12.5 mg into feeding  tube at bedtime.   Water For Irrigation, Sterile (STERILE WATER FOR IRRIGATION) Flush with 240 cc every 6 hours   No facility-administered encounter medications on file as of 03/08/2019.     Review of Systems  Unable to perform ROS: Other     There is no immunization history on file for this patient. Pertinent  Health Maintenance Due  Topic Date Due   INFLUENZA VACCINE  03/12/2019 (Originally 07/12/2018)   FOOT EXAM  04/08/2019 (Originally 06/14/1956)   OPHTHALMOLOGY EXAM  04/08/2019 (Originally 06/14/1956)   URINE MICROALBUMIN  04/08/2019 (Originally 06/14/1956)   COLONOSCOPY  04/08/2019 (Originally 06/14/1994)   PNA vac Low Risk Adult (1 of 2 - PCV13) 04/08/2019 (Originally 06/14/2009)   HEMOGLOBIN A1C  07/30/2019   No flowsheet data found. Functional Status Survey:    Vitals:   03/08/19 0839  BP: (!) 142/83  Pulse: (!) 9  Resp: 18  Temp: (!) 97.5 F (36.4 C)  Weight: 168 lb (76.2 kg)   Height: 5\' 11"  (1.803 m)   Body mass index is 23.43 kg/m. Physical Exam Vitals signs reviewed.  Constitutional:      Appearance: Normal appearance.  HENT:     Head: Normocephalic.     Nose: Nose normal.  Eyes:     Pupils: Pupils are equal, round, and reactive to light.  Neck:     Musculoskeletal: Neck supple.  Cardiovascular:     Rate and Rhythm: Normal rate and regular rhythm.     Pulses: Normal pulses.     Heart sounds: Normal heart sounds.  Pulmonary:     Effort: Pulmonary effort is normal. No respiratory distress.     Breath sounds: Normal breath sounds. No wheezing or rales.  Abdominal:     General: Abdomen is flat. Bowel sounds are normal. There is no distension.     Palpations: Abdomen is soft.     Tenderness: There is no abdominal tenderness. There is no guarding.  Musculoskeletal:        General: No swelling.  Skin:    General: Skin is warm.     Comments: Sacral Pressure ulcer is Healed  Neurological:     Mental Status: He is alert.     Comments: Patient has Right UE hemiparesis. Does move his LE involuntarily. Good Strength in Left Upper and Lower extremities. Aphasic. Does follow some simple commands.Few good words  Psychiatric:     Comments: Cannot assess     Labs reviewed: Recent Labs    01/16/19 0700 01/28/19 0730 01/29/19 0700  NA 137 143 139  K 4.7 4.3 4.0  CL 99 103 101  CO2 31 33* 30  GLUCOSE 371* 285* 263*  BUN 44* 38* 39*  CREATININE 1.10 1.17 1.17  CALCIUM 8.7* 9.2 9.0   Recent Labs    01/04/19 1445 01/29/19 0700  AST 33 52*  ALT 47* 59*  ALKPHOS 132* 121  BILITOT 0.8 0.6  PROT 6.6 6.0*  ALBUMIN 2.7* 2.7*   Recent Labs    12/24/18 0850  01/05/19 0715 01/06/19 0620 01/16/19 0700 01/28/19 0730  WBC 7.4   < > 6.6 6.8 10.1 8.1  NEUTROABS 5.0  --  5.1  --  7.4  --   HGB 13.7   < > 13.8 13.6 13.9 14.9  HCT 43.7   < > 44.3 43.8 45.2 48.4  MCV 94.6   < > 95.7 98.4 96.2 98.4  PLT 271   < > 291 309 287 282   < > =  values in  this interval not displayed.   No results found for: TSH Lab Results  Component Value Date   HGBA1C 7.6 (H) 01/29/2019   Lab Results  Component Value Date   CHOL 116 01/29/2019   HDL 19 (L) 01/29/2019   LDLCALC 50 01/29/2019   TRIG 236 (H) 01/29/2019   CHOLHDL 6.1 01/29/2019    Significant Diagnostic Results in last 30 days:  Ir Replc Gastro/colonic Tube Percut W/fluoro  Result Date: 02/13/2019 INDICATION: 73 year old with history of cerebrovascular accident and requires gastrostomy tube due to failed swallowing exam. Patient is repeatedly pulling out his gastrostomy tube. Patient currently has no feeding tube present. EXAM: GASTROSTOMY TUBE REPLACEMENT WITH FLUOROSCOPY MEDICATIONS: None ANESTHESIA/SEDATION: None CONTRAST:  20 mL-administered into the gastric lumen. FLUOROSCOPY TIME:  Fluoroscopy Time: 24 seconds, 2 mGy COMPLICATIONS: None immediate. PROCEDURE: Informed written consent was obtained from the patient's wife after a thorough discussion of the procedural risks, benefits and alternatives. All questions were addressed. Maximal Sterile Barrier Technique was utilized including caps, mask, sterile gowns, sterile gloves, sterile drape, hand hygiene and skin antiseptic. A timeout was performed prior to the initiation of the procedure. The anterior abdomen and gastrostomy tube site was prepped and draped in sterile fashion. The stomach was cannulated using a 5 [place] Kumpe catheter and stiff Amplatz wire through the gastrostomy site. Contrast injection confirmed placement in the stomach. The tract was dilated with 18 and 20 [place] dilators. 50 French balloon retention catheter was advanced over the Amplatz wire with some difficulty. Tube position was confirmed within the stomach. The balloon was inflated with approximately 8 mL of saline. Contrast injection confirmed placement in the stomach. Catheter was flushed with saline. IMPRESSION: Successful replacement of the gastrostomy tube. Patient  currently has an 92 French balloon retention catheter. Gastrostomy tube is ready for use. Electronically Signed   By: Markus Daft M.D.   On: 02/13/2019 13:14   Dg Abdomen Peg Tube Location  Result Date: 02/12/2019 CLINICAL DATA:  Check peg placement EXAM: ABDOMEN - 1 VIEW COMPARISON:  None. FINDINGS: Contrast material was injected via a gastrostomy catheter and shows free flow contrast material into the stomach. Contrast is also noted within the colon from previous injections. IMPRESSION: Gastrostomy catheter within the stomach. Electronically Signed   By: Inez Catalina M.D.   On: 02/12/2019 09:24   Dg Abdomen Peg Tube Location  Result Date: 02/11/2019 CLINICAL DATA:  Peg tube placement. EXAM: ABDOMEN - 1 VIEW COMPARISON:  CT abdomen and pelvis 01/04/2019 FINDINGS: 15 mL Omnipaque 300 and 15 mL of water was administered via the existing PEG tube. Contrast is seen within the stomach and proximal duodenum. There is no leak. IMPRESSION: Peg tube placement confirmed within the stomach. Electronically Signed   By: San Morelle M.D.   On: 02/11/2019 19:39    Assessment/Plan Dysphagia Patient is actually doing well with p.o. We will stop his tube feeds completely Clamp the tube tube Discussed with the nurses that he does not need a replacement if he pulls it out Nontraumatic subcortical hemorrhage of left cerebral hemisphere Patient continues to have right hemiparesis He is making some slow progress.  Continues to be dependent for his ADL On baclofen 10 mg BID  Neurology Follow up pending IDDM (insulin dependent diabetes mellitus) BS Running less than 200 in the morning he He usually peaks around 250 in the evening time We will decrease his Lantus to 26 units as dropping the tube feeds Continue the sliding scale till then  We will reassess his insulin needs in a week  Other Issues Essential hypertension BPStable Continue to monitor On Norvasc and metroprolol Wound of  buttock, Resolved Urinary retention Continue Foley for now. Urology follow up in 3 months Right now they want Catheter continued Gastric Ulcer On Protonix Follow with Gastro Hyperlipidemia On Atorvastatin LDL 50    Family/ staff Communication:   Labs/tests ordered:

## 2019-03-13 ENCOUNTER — Encounter (HOSPITAL_COMMUNITY)
Admission: RE | Admit: 2019-03-13 | Discharge: 2019-03-13 | Disposition: A | Discharge status: Home / Self Care | Payer: Medicare Other | Source: Skilled Nursing Facility | Attending: Internal Medicine | Admitting: Internal Medicine

## 2019-03-13 ENCOUNTER — Other Ambulatory Visit (HOSPITAL_COMMUNITY)
Admission: AD | Admit: 2019-03-13 | Discharge: 2019-03-13 | Disposition: A | Discharge status: Home / Self Care | Payer: Medicare Other | Source: Skilled Nursing Facility | Attending: Internal Medicine | Admitting: Internal Medicine

## 2019-03-13 DIAGNOSIS — Z7901 Long term (current) use of anticoagulants: Secondary | ICD-10-CM | POA: Insufficient documentation

## 2019-03-13 DIAGNOSIS — R1312 Dysphagia, oropharyngeal phase: Secondary | ICD-10-CM | POA: Diagnosis not present

## 2019-03-13 DIAGNOSIS — I610 Nontraumatic intracerebral hemorrhage in hemisphere, subcortical: Secondary | ICD-10-CM | POA: Insufficient documentation

## 2019-03-13 DIAGNOSIS — Z794 Long term (current) use of insulin: Secondary | ICD-10-CM | POA: Insufficient documentation

## 2019-03-13 DIAGNOSIS — I2510 Atherosclerotic heart disease of native coronary artery without angina pectoris: Secondary | ICD-10-CM | POA: Insufficient documentation

## 2019-03-13 DIAGNOSIS — N390 Urinary tract infection, site not specified: Secondary | ICD-10-CM | POA: Diagnosis present

## 2019-03-13 DIAGNOSIS — R809 Proteinuria, unspecified: Secondary | ICD-10-CM | POA: Diagnosis not present

## 2019-03-13 DIAGNOSIS — Z79899 Other long term (current) drug therapy: Secondary | ICD-10-CM | POA: Insufficient documentation

## 2019-03-13 DIAGNOSIS — E43 Unspecified severe protein-calorie malnutrition: Secondary | ICD-10-CM | POA: Diagnosis not present

## 2019-03-13 DIAGNOSIS — F69 Unspecified disorder of adult personality and behavior: Secondary | ICD-10-CM | POA: Insufficient documentation

## 2019-03-13 DIAGNOSIS — E785 Hyperlipidemia, unspecified: Secondary | ICD-10-CM | POA: Diagnosis not present

## 2019-03-13 DIAGNOSIS — F411 Generalized anxiety disorder: Secondary | ICD-10-CM | POA: Insufficient documentation

## 2019-03-13 DIAGNOSIS — E1159 Type 2 diabetes mellitus with other circulatory complications: Secondary | ICD-10-CM | POA: Diagnosis not present

## 2019-03-13 DIAGNOSIS — Z87891 Personal history of nicotine dependence: Secondary | ICD-10-CM | POA: Diagnosis not present

## 2019-03-13 DIAGNOSIS — I10 Essential (primary) hypertension: Secondary | ICD-10-CM | POA: Insufficient documentation

## 2019-03-13 DIAGNOSIS — E1169 Type 2 diabetes mellitus with other specified complication: Secondary | ICD-10-CM | POA: Diagnosis not present

## 2019-03-13 LAB — CBC
HCT: 45 % (ref 39.0–52.0)
Hemoglobin: 14.4 g/dL (ref 13.0–17.0)
MCH: 29.8 pg (ref 26.0–34.0)
MCHC: 32 g/dL (ref 30.0–36.0)
MCV: 93.2 fL (ref 80.0–100.0)
Platelets: 270 10*3/uL (ref 150–400)
RBC: 4.83 MIL/uL (ref 4.22–5.81)
RDW: 12.5 % (ref 11.5–15.5)
WBC: 9.3 10*3/uL (ref 4.0–10.5)
nRBC: 0 % (ref 0.0–0.2)

## 2019-03-13 LAB — LIPID PANEL
Cholesterol: 106 mg/dL (ref 0–200)
HDL: 23 mg/dL — ABNORMAL LOW (ref >40)
LDL Cholesterol: 58 mg/dL (ref 0–99)
Total CHOL/HDL Ratio: 4.6 RATIO
Triglycerides: 124 mg/dL (ref <150)
VLDL: 25 mg/dL (ref 0–40)

## 2019-03-14 LAB — MICROALBUMIN, URINE: Microalb, Ur: 2236 ug/mL — ABNORMAL HIGH

## 2019-03-29 ENCOUNTER — Encounter: Payer: Self-pay | Admitting: Adult Health

## 2019-03-29 ENCOUNTER — Non-Acute Institutional Stay (SKILLED_NURSING_FACILITY): Payer: Medicare Other | Admitting: Adult Health

## 2019-03-29 DIAGNOSIS — E1169 Type 2 diabetes mellitus with other specified complication: Secondary | ICD-10-CM | POA: Diagnosis not present

## 2019-03-29 DIAGNOSIS — E1159 Type 2 diabetes mellitus with other circulatory complications: Secondary | ICD-10-CM

## 2019-03-29 DIAGNOSIS — E785 Hyperlipidemia, unspecified: Secondary | ICD-10-CM

## 2019-03-29 DIAGNOSIS — I2510 Atherosclerotic heart disease of native coronary artery without angina pectoris: Secondary | ICD-10-CM | POA: Diagnosis not present

## 2019-03-29 DIAGNOSIS — I10 Essential (primary) hypertension: Secondary | ICD-10-CM

## 2019-03-29 DIAGNOSIS — I152 Hypertension secondary to endocrine disorders: Secondary | ICD-10-CM

## 2019-03-29 NOTE — Progress Notes (Signed)
Location:   [facility] Room Number: 130 P Place of Service:  SNF (31)   CODE STATUS:  Full Code  No Known Allergies    Chief Complaint  Patient presents with  . Medical Management of Chronic Issues    Coronary artery disease involving native coronary artery of native heart without angina pectoris; hypertension associated with diabetes; dyslipidemia associated with type 2 diabetes mellitus.     HPI:  He is a 73 year old long term resident of this facility being seen for the management of his chronic illnesses: cad; hypertension; dyslipidemia. There are no reports of agitation; no anxiety; no uncontrolled pain; no reports of fevers present.  Past Medical History:  Diagnosis Date  . A-fib ([place])    a. Post-op afib after CABG 08/2012.  [name] Acute gastric ulcer with hemorrhage   . Acute respiratory failure with hypoxia ([place])   . Aphasia following cerebral infarction   . Atrial fibrillation ([place])   . CAD (coronary artery disease)    a. NSTEMI s/p stent to distal RCA 03/2000. b. Inf MI s/p emergent thrombectomy/stenting mid RCA 10/2000. c. NSTEMI s/p CABGx4 (LIMA-LAD, SVG-OM2, seq SVG-acute marginal and PD) 07/16/62 - course complicated by confusion, post-op AF, L pleural effusion with thoracentesis. d. Normal LV function by echo 08/2012.  . Diabetes mellitus   . Diverticulosis   . Dysphagia following cerebral infarction   . Encephalopathy   . Extended spectrum beta lactamase (ESBL) resistance   . Generalized anxiety disorder   . GERD (gastroesophageal reflux disease)   . Hemiplegia and hemiparesis following cerebral infarction affecting right dominant side ([place])   . Hiatal hernia   . HLD (hyperlipidemia)   . HTN (hypertension)   . Non-ST elevation (NSTEMI) myocardial infarction ([place])   . Nontraumatic intracerebral hemorrhage in hemisphere, subcortical ([place])   . Peripheral vascular disease ([place])    a. Carotid dopplers neg 08/13/12. b. Pre-cabg ABIs - R=0.87  suggesting mild dz, L=1.29 possibly falsely elevated due to calcified vessels.  . Pleural effusion    a. L pleural eff after CABG s/p thoracentesis 08/22/12.  . Prostate cancer [facility])    Status post radiation treatment.  . Prostatic hypertrophy    a. Hx of urinary retention, awaiting TURP.  [name] Severe protein-calorie malnutrition ([place])   . Tobacco abuse   . 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 Valvular heart disease    a. Mild  MR by TEE 08/2012.    Past Surgical History:  Procedure Laterality Date  . APPENDECTOMY    . BACK SURGERY    . CORONARY ARTERY BYPASS GRAFT  08/16/2012   Procedure: CORONARY ARTERY BYPASS GRAFTING (CABG);  Surgeon: Melrose Nakayama, MD;  Location: [place];  Service: Open Heart Surgery;  Laterality: N/A;  . IR REPLC GASTRO/COLONIC TUBE PERCUT W/FLUORO  02/13/2019  . LEFT HEART CATHETERIZATION WITH CORONARY ANGIOGRAM N/A 08/14/2012   Procedure: LEFT HEART CATHETERIZATION WITH CORONARY ANGIOGRAM;  Surgeon: Peter M Martinique, MD;  Location: [facility] CATH LAB;  Service: Cardiovascular;  Laterality: N/A;    Social History   Socioeconomic History  . Marital status: Married    Spouse name: Not on file  . Number of children: Not on file  . Years of education: Not on file  . Highest education level: Not on file  Occupational History  . Not on file  Social Needs  . Financial resource strain: Not on file  . Food insecurity:    Worry:  Not on file    Inability: Not on file  . Transportation needs:    Medical: Not on file    Non-medical: Not on file  Tobacco Use  . Smoking status: Former Smoker    Last attempt to quit: 11/12/1972    Years since quitting: 46.4  . Smokeless tobacco: Never Used  Substance and Sexual Activity  . Alcohol use: No  . Drug use: No  . Sexual activity: Never  Lifestyle  . Physical activity:    Days per week: Not on file    Minutes per session: Not on file  . Stress: Not on file  Relationships  . Social connections:     Talks on phone: Not on file    Gets together: Not on file    Attends religious service: Not on file    Active member of club or organization: Not on file    Attends meetings of clubs or organizations: Not on file    Relationship status: Not on file  . Intimate partner violence:    Fear of current or ex partner: Not on file    Emotionally abused: Not on file    Physically abused: Not on file    Forced sexual activity: Not on file  Other Topics Concern  . Not on file  Social History Narrative  . Not on file   History reviewed. No pertinent family history.    VITAL SIGNS BP 118/61   Pulse 71   Temp (!) 97.5 F (36.4 C)   Resp 18   Ht 5\' 11"  (1.803 m)   Wt 170 lb 12.8 oz (77.5 kg)   BMI 23.82 kg/m   Outpatient Encounter Medications as of 03/29/2019  Medication Sig  . Amino Acids-Protein Hydrolys (FEEDING SUPPLEMENT, PRO-STAT SUGAR FREE 64,) LIQD Place 30 mLs into feeding tube 2 (two) times daily.   [name] amLODipine (NORVASC) 5 MG tablet Place 5 mg into feeding tube daily.  [name] atorvastatin (LIPITOR) 40 MG tablet Place 40 mg into feeding tube at bedtime.   . baclofen (LIORESAL) 10 MG tablet 1 tablet by Gastric Tube route 2 (two) times daily.   Roseanne Kaufman [place]-Castor Oil (VENELEX) OINT Apply topically to sacrum, coccyx and bilateral buttocks every shift and as needed for prevention  . diphenoxylate-atropine (LOMOTIL) 2.5-0.025 MG tablet Place 2 tablets into feeding tube every 4 (four) hours as needed for diarrhea or loose stools.  [name] FLUoxetine (PROZAC) 20 MG/5ML solution Place 20 mg into feeding tube daily.   . hydrocortisone cream (PREPARATION H) 1 % Apply rectally twice daily and as needed for hemorrhoids  . insulin aspart (NOVOLOG FLEXPEN) 100 UNIT/ML FlexPen Give subcutaneous per sliding scale every 6 hours; If blood sugars are less than 60, CALL MD 200-250= 4 units 251-300= 6 units 301-351= 8 units 351-400= 10 units If Blood sugar is greater than 400, call MD  . Insulin Glargine  (LANTUS SOLOSTAR) 100 UNIT/ML Solostar Pen Inject 26 Units into the skin every morning.   . lansoprazole (PREVACID SOLUTAB) 30 MG disintegrating tablet Place 30 mg into feeding tube 2 (two) times daily.  . Melatonin 1 MG TABS 2 tablets by Gastric Tube route at bedtime.   . metoprolol tartrate (LOPRESSOR) 25 MG tablet Place 12.5 mg into feeding tube 2 (two) times daily.  . NON FORMULARY Diet Type:   Initiate puree consistency diet with honey thick liquid  . Ostomy Supplies (SKIN PREP SPRAY) MISC Apply to bilateral heels every shift for prevention  . Pramoxine-Calamine (AVEENO  ANTI-ITCH) 1-3 % LOTN Apply to the extremities and back daily and prn for itching/ dry skin. Do not apply to the buttocks wound  . QUEtiapine (SEROQUEL) 25 MG tablet Place 12.5 mg into feeding tube at bedtime.  . Water For Irrigation, Sterile (STERILE WATER FOR IRRIGATION) Flush with 240 cc every 6 hours  . [DISCONTINUED] GLUCERNA (GLUCERNA) LIQD Glucerna 1.2 Cal liquid; 0.08-1.5 gram-kcal/ml - Give 1.5 (356 mls)  four times daily.  Mix each bolus with 240 cc (8oz) free water to help w/admin Hold meal time boluses (0900, 1400, 1900) if eats > 50% of preceding meal   No facility-administered encounter medications on file as of 03/29/2019.      SIGNIFICANT DIAGNOSTIC EXAMS  LABS REVIEWED PREVIOUS:   01-04-19: urine culture: klebsiella oxytoca  01-16-19: wbc 10.1; hgb 13.9; hct 45.2; mcv 96.2; plt 287; glucose 371; bun 44; creat 1.10; k+ 4.7; na++137; ca 8.7 01-28-19: wbc 8.1; hgb 14.9; hct 48.4; mcv 98.;4 plt 282; glucose 285; bun 38; creat 1.17; k+ 4.3 na++ 143 ca 9.3  01-29-19: glucose 263; bun 39; creat 1.17;  k+ 4.0; na++ 139; ca 9.0; ast 52; alt 59  albumin 2.7 hgb a1c 7.6; chol 116; ldl 50; trig 236; hdl 19   TODAY:   03-13-19: wbc 9.3 hgb 14.4; hct 45.0; mcv 93.2; plt 270; chol 106; ldl 58 trig 124; hdl 23; urine micro-albumin 2236.0    Review of Systems  Reason unable to perform ROS: expressive aphasia     Physical  Exam Constitutional:      General: He is not in acute distress.    Appearance: He is well-developed. He is not diaphoretic.  Neck:     Musculoskeletal: Neck supple.     Thyroid: No thyromegaly.  Cardiovascular:     Rate and Rhythm: Normal rate and regular rhythm.     Pulses: Normal pulses.     Heart sounds: Normal heart sounds.  Pulmonary:     Effort: Pulmonary effort is normal. No respiratory distress.     Breath sounds: Normal breath sounds.  Abdominal:     General: Bowel sounds are normal. There is no distension.     Palpations: Abdomen is soft.     Tenderness: There is no abdominal tenderness.     Comments: Has peg tube   Genitourinary:    Comments: Has foley  Musculoskeletal: Normal range of motion.     Comments: Right side hemiparesis Able to move left extremities    Lymphadenopathy:     Cervical: No cervical adenopathy.  Skin:    General: Skin is warm and dry.  Neurological:     Mental Status: He is alert. Mental status is at baseline.  Psychiatric:        Mood and Affect: Mood normal.     ASSESSMENT/ PLAN:  TODAY:   1. CAD: native artery, native heart without angina: is status post CABG 2013: is stable will monitor is on statin.   2. Hypertension associated with diabetes: is stable b/p 118/61: will continue norvasc 5 mg daily and lopressor 12.5 mg twice daily   3. Dyslipidemia associated with type 2 diabetes mellitus: is stable LDL 58; will continue lipitor 80 mg daily   PREVIOUS:   4. Major depression with psychotic features: is stable will continue prozac 20 mg daily; seroquel 12.5 mg nightly ( has failed coming off sequel in the past)  Is off prn ativan   5. Dysphagia oropharyngeal phase: no signs of aspiration present: is on pureed  diet with honey thick liquids will monitor   6. Chronic urine retention: is stable requires foley is followed by urology   7. Protein calorie malnutrition severe: is without change weight is stable at  170 (previous 165)  pounds; albumin is 2.7 will continue prostat 30 cc daily   8. Type 2 diabetes mellitus with peripheral vascular disease: is stable hgb a1c 7.6; will continue lantus 26 units nightly with novolog SSI: 200-250: 4 units; 251-300: 6 units; 301-350: 8 units; 351 -400: 10 units  9. Nontraumatic subcortical hemorrhage of left cerebral hemisphere/hemorrhagic cerebrovascular accident is stable neurologically will continue baclofen 10 mg twice daily for spasticity.         MD is aware of resident's narcotic use and is in agreement with current plan of care. We will attempt to wean resident as apropriate   Ok Edwards NP [facility]  Contact [phone] Monday through Friday 8am- 5pm  After hours call [phone]

## 2019-04-03 ENCOUNTER — Non-Acute Institutional Stay (SKILLED_NURSING_FACILITY): Payer: Medicare Other | Admitting: Adult Health

## 2019-04-03 ENCOUNTER — Encounter: Payer: Self-pay | Admitting: Adult Health

## 2019-04-03 DIAGNOSIS — I610 Nontraumatic intracerebral hemorrhage in hemisphere, subcortical: Secondary | ICD-10-CM | POA: Diagnosis not present

## 2019-04-03 DIAGNOSIS — R1312 Dysphagia, oropharyngeal phase: Secondary | ICD-10-CM

## 2019-04-03 NOTE — Progress Notes (Signed)
Location:    [facility] Room Number: 130/P Place of Service:  SNF (31)   CODE STATUS: Full Code  No Known Allergies  Chief Complaint  Patient presents with  . Acute Visit    Care Plan Meeting    HPI:  We have come together for his routine care plan meeting. His wife is present via phone. He is doing well. He is eating pureed with honey thick liquids; he is able to feed himself. He will need to be changed to po medications. There are no reports of uncontrolled pain. There are no reports of significant changes in weight. There are no reports of agitation or anxiety present. He continues to be a long term resident at this time.   Past Medical History:  Diagnosis Date  . A-fib ([place])    a. Post-op afib after CABG 08/2012.  [name] Acute gastric ulcer with hemorrhage   . Acute respiratory failure with hypoxia ([place])   . Aphasia following cerebral infarction   . Atrial fibrillation ([place])   . CAD (coronary artery disease)    a. NSTEMI s/p stent to distal RCA 03/2000. b. Inf MI s/p emergent thrombectomy/stenting mid RCA 10/2000. c. NSTEMI s/p CABGx4 (LIMA-LAD, SVG-OM2, seq SVG-acute marginal and PD) 12/17/08 - course complicated by confusion, post-op AF, L pleural effusion with thoracentesis. d. Normal LV function by echo 08/2012.  . Diabetes mellitus   . Diverticulosis   . Dysphagia following cerebral infarction   . Encephalopathy   . Extended spectrum beta lactamase (ESBL) resistance   . Generalized anxiety disorder   . GERD (gastroesophageal reflux disease)   . Hemiplegia and hemiparesis following cerebral infarction affecting right dominant side ([place])   . Hiatal hernia   . HLD (hyperlipidemia)   . HTN (hypertension)   . Non-ST elevation (NSTEMI) myocardial infarction ([place])   . Nontraumatic intracerebral hemorrhage in hemisphere, subcortical ([place])   . Peripheral vascular disease ([place])    a. Carotid dopplers neg 08/13/12. b. Pre-cabg ABIs - R=0.87 suggesting mild dz,  L=1.29 possibly falsely elevated due to calcified vessels.  . Pleural effusion    a. L pleural eff after CABG s/p thoracentesis 08/22/12.  . Prostate cancer [facility])    Status post radiation treatment.  . Prostatic hypertrophy    a. Hx of urinary retention, awaiting TURP.  [name] Severe protein-calorie malnutrition ([place])   . Tobacco abuse   . 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 Valvular heart disease    a. Mild  MR by TEE 08/2012.    Past Surgical History:  Procedure Laterality Date  . APPENDECTOMY    . BACK SURGERY    . CORONARY ARTERY BYPASS GRAFT  08/16/2012   Procedure: CORONARY ARTERY BYPASS GRAFTING (CABG);  Surgeon: Melrose Nakayama, MD;  Location: [facility];  Service: Open Heart Surgery;  Laterality: N/A;  . IR REPLC GASTRO/COLONIC TUBE PERCUT W/FLUORO  02/13/2019  . LEFT HEART CATHETERIZATION WITH CORONARY ANGIOGRAM N/A 08/14/2012   Procedure: LEFT HEART CATHETERIZATION WITH CORONARY ANGIOGRAM;  Surgeon: Peter M Martinique, MD;  Location: [facility] CATH LAB;  Service: Cardiovascular;  Laterality: N/A;    Social History   Socioeconomic History  . Marital status: Married    Spouse name: Not on file  . Number of children: Not on file  . Years of education: Not on file  . Highest education level: Not on file  Occupational History  . Not on file  Social Needs  . Financial resource strain:  Not on file  . Food insecurity:    Worry: Not on file    Inability: Not on file  . Transportation needs:    Medical: Not on file    Non-medical: Not on file  Tobacco Use  . Smoking status: Former Smoker    Last attempt to quit: 11/12/1972    Years since quitting: 46.4  . Smokeless tobacco: Never Used  Substance and Sexual Activity  . Alcohol use: No  . Drug use: No  . Sexual activity: Never  Lifestyle  . Physical activity:    Days per week: Not on file    Minutes per session: Not on file  . Stress: Not on file  Relationships  . Social connections:    Talks on phone: Not on  file    Gets together: Not on file    Attends religious service: Not on file    Active member of club or organization: Not on file    Attends meetings of clubs or organizations: Not on file    Relationship status: Not on file  . Intimate partner violence:    Fear of current or ex partner: Not on file    Emotionally abused: Not on file    Physically abused: Not on file    Forced sexual activity: Not on file  Other Topics Concern  . Not on file  Social History Narrative  . Not on file   History reviewed. No pertinent family history.    VITAL SIGNS BP 123/64   Pulse 79   Temp (!) 97 F (36.1 C) (Oral)   Resp 17   Ht 5\' 11"  (1.803 m)   Wt 170 lb 12.8 oz (77.5 kg)   BMI 23.82 kg/m   Outpatient Encounter Medications as of 04/03/2019  Medication Sig  . Amino Acids-Protein Hydrolys (FEEDING SUPPLEMENT, PRO-STAT SUGAR FREE 64,) LIQD Place 30 mLs into feeding tube 2 (two) times daily.   [name] amLODipine (NORVASC) 5 MG tablet Place 5 mg into feeding tube daily.  [name] atorvastatin (LIPITOR) 40 MG tablet Place 40 mg into feeding tube at bedtime.   . baclofen (LIORESAL) 10 MG tablet 1 tablet by Gastric Tube route 2 (two) times daily.   Roseanne Kaufman [place]-Castor Oil (VENELEX) OINT Apply topically to sacrum, coccyx and bilateral buttocks every shift and as needed for prevention  . diphenoxylate-atropine (LOMOTIL) 2.5-0.025 MG tablet Place 2 tablets into feeding tube every 4 (four) hours as needed for diarrhea or loose stools.  [name] FLUoxetine (PROZAC) 20 MG/5ML solution Place 20 mg into feeding tube daily.   . hydrocortisone cream (PREPARATION H) 1 % Apply rectally twice daily and as needed for hemorrhoids  . insulin aspart (NOVOLOG FLEXPEN) 100 UNIT/ML FlexPen Give subcutaneous per sliding scale before meals; If blood sugars are less than 60, CALL MD 200-250= 4 units 251-300= 6 units 301-351= 8 units 351-400= 10 units If Blood sugar is greater than 400, call MD  . Insulin Glargine (LANTUS SOLOSTAR)  100 UNIT/ML Solostar Pen Inject 26 Units into the skin every morning.   . lansoprazole (PREVACID SOLUTAB) 30 MG disintegrating tablet Place 30 mg into feeding tube 2 (two) times daily.  . Melatonin 1 MG TABS 2 tablets by Gastric Tube route at bedtime.   . metoprolol tartrate (LOPRESSOR) 25 MG tablet Place 12.5 mg into feeding tube 2 (two) times daily.  . NON FORMULARY Diet Type:   Initiate puree consistency diet with honey thick liquid  . Ostomy Supplies (SKIN PREP SPRAY) MISC Apply  to bilateral heels every shift for prevention  . Pramoxine-Calamine (AVEENO ANTI-ITCH) 1-3 % LOTN Apply to the extremities and back daily and prn for itching/ dry skin. Do not apply to the buttocks wound  . QUEtiapine (SEROQUEL) 25 MG tablet Place 12.5 mg into feeding tube at bedtime.  . Water For Irrigation, Sterile (STERILE WATER FOR IRRIGATION) Flush with 240 cc every 6 hours   No facility-administered encounter medications on file as of 04/03/2019.      SIGNIFICANT DIAGNOSTIC EXAMS   LABS REVIEWED PREVIOUS:   01-04-19: urine culture: klebsiella oxytoca  01-16-19: wbc 10.1; hgb 13.9; hct 45.2; mcv 96.2; plt 287; glucose 371; bun 44; creat 1.10; k+ 4.7; na++137; ca 8.7 01-28-19: wbc 8.1; hgb 14.9; hct 48.4; mcv 98.;4 plt 282; glucose 285; bun 38; creat 1.17; k+ 4.3 na++ 143 ca 9.3  01-29-19: glucose 263; bun 39; creat 1.17;  k+ 4.0; na++ 139; ca 9.0; ast 52; alt 59  albumin 2.7 hgb a1c 7.6; chol 116; ldl 50; trig 236; hdl 19  03-13-19: wbc 9.3 hgb 14.4; hct 45.0; mcv 93.2; plt 270; chol 106; ldl 58 trig 124; hdl 23; urine micro-albumin 2236.0   NO NEW LABS.    Review of Systems  Reason unable to perform ROS: expressive aphasia     Physical Exam Constitutional:      General: He is not in acute distress.    Appearance: He is well-developed. He is not diaphoretic.  Neck:     Musculoskeletal: Neck supple.     Thyroid: No thyromegaly.  Cardiovascular:     Rate and Rhythm: Normal rate and regular rhythm.      Pulses: Normal pulses.     Heart sounds: Normal heart sounds.  Pulmonary:     Effort: Pulmonary effort is normal. No respiratory distress.     Breath sounds: Normal breath sounds.  Abdominal:     General: Bowel sounds are normal. There is no distension.     Palpations: Abdomen is soft.     Tenderness: There is no abdominal tenderness.     Comments: Peg tube present   Genitourinary:    Comments: Foley present  Musculoskeletal:     Right lower leg: No edema.     Left lower leg: No edema.     Comments: Right side hemiparesis Able to move left extremities     Lymphadenopathy:     Cervical: No cervical adenopathy.  Skin:    General: Skin is warm and dry.  Neurological:     Mental Status: He is alert. Mental status is at baseline.  Psychiatric:        Mood and Affect: Mood normal.       ASSESSMENT/ PLAN:  TODAY:   1. Nontraumatic subcortical hemorrhage of left cerebral hemisphere  2. Dysphagia oropharyngeal phase  Will change his medications to oral Will continue his current medication regimen Will continue his current plan of care.   MD is aware of resident's narcotic use and is in agreement with current plan of care. We will attempt to wean resident as apropriate   Ok Edwards NP [facility] Adult [facility]  Contact [phone] Monday through Friday 8am- 5pm  After hours call [phone]

## 2019-04-04 ENCOUNTER — Other Ambulatory Visit: Payer: Self-pay | Admitting: Adult Health

## 2019-04-04 MED ORDER — DIPHENOXYLATE-ATROPINE 2.5-0.025 MG PO TABS: 2.0000 | ORAL_TABLET | ORAL | 0 refills | Status: DC | PRN

## 2019-04-25 ENCOUNTER — Encounter: Payer: Self-pay | Admitting: Adult Health

## 2019-04-25 ENCOUNTER — Non-Acute Institutional Stay (SKILLED_NURSING_FACILITY): Payer: Medicare Other | Admitting: Adult Health

## 2019-04-25 DIAGNOSIS — Z960 Presence of urogenital implants: Secondary | ICD-10-CM | POA: Diagnosis not present

## 2019-04-25 DIAGNOSIS — S3994XS Unspecified injury of external genitals, sequela: Secondary | ICD-10-CM | POA: Insufficient documentation

## 2019-04-25 DIAGNOSIS — R339 Retention of urine, unspecified: Secondary | ICD-10-CM

## 2019-04-25 DIAGNOSIS — S3994XA Unspecified injury of external genitals, initial encounter: Secondary | ICD-10-CM

## 2019-04-25 DIAGNOSIS — Z978 Presence of other specified devices: Secondary | ICD-10-CM | POA: Insufficient documentation

## 2019-04-25 DIAGNOSIS — T839XXA Unspecified complication of genitourinary prosthetic device, implant and graft, initial encounter: Secondary | ICD-10-CM | POA: Diagnosis not present

## 2019-04-25 NOTE — Progress Notes (Signed)
Location:   [place] Room Number: 130 P Place of Service:  SNF (31)   CODE STATUS: Full Code  No Known Allergies  Chief Complaint  Patient presents with  . Acute Visit    Penile Edema and discharge    HPI:  Staff reports that he has drainage from his urinary meatus. He has a chronic foley in place. He does have some bleeding present with a scant amount of nonpurulent drainage present. There are no reports of fevers present. There are no reports of pain present.   Past Medical History:  Diagnosis Date  . A-fib ([place])    a. Post-op afib after CABG 08/2012.  [name] Acute gastric ulcer with hemorrhage   . Acute respiratory failure with hypoxia ([place])   . Aphasia following cerebral infarction   . Atrial fibrillation ([place])   . CAD (coronary artery disease)    a. NSTEMI s/p stent to distal RCA 03/2000. b. Inf MI s/p emergent thrombectomy/stenting mid RCA 10/2000. c. NSTEMI s/p CABGx4 (LIMA-LAD, SVG-OM2, seq SVG-acute marginal and PD) 12/17/94 - course complicated by confusion, post-op AF, L pleural effusion with thoracentesis. d. Normal LV function by echo 08/2012.  . Diabetes mellitus   . Diverticulosis   . Dysphagia following cerebral infarction   . Encephalopathy   . Extended spectrum beta lactamase (ESBL) resistance   . Generalized anxiety disorder   . GERD (gastroesophageal reflux disease)   . Hemiplegia and hemiparesis following cerebral infarction affecting right dominant side ([place])   . Hiatal hernia   . HLD (hyperlipidemia)   . HTN (hypertension)   . Non-ST elevation (NSTEMI) myocardial infarction ([place])   . Nontraumatic intracerebral hemorrhage in hemisphere, subcortical ([place])   . Peripheral vascular disease ([place])    a. Carotid dopplers neg 08/13/12. b. Pre-cabg ABIs - R=0.87 suggesting mild dz, L=1.29 possibly falsely elevated due to calcified vessels.  . Pleural effusion    a. L pleural eff after CABG s/p thoracentesis 08/22/12.  . Prostate cancer  [facility])    Status post radiation treatment.  . Prostatic hypertrophy    a. Hx of urinary retention, awaiting TURP.  [name] Severe protein-calorie malnutrition ([place])   . Tobacco abuse   . 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 Valvular heart disease    a. Mild  MR by TEE 08/2012.    Past Surgical History:  Procedure Laterality Date  . APPENDECTOMY    . BACK SURGERY    . CORONARY ARTERY BYPASS GRAFT  08/16/2012   Procedure: CORONARY ARTERY BYPASS GRAFTING (CABG);  Surgeon: Melrose Nakayama, MD;  Location: [place];  Service: Open Heart Surgery;  Laterality: N/A;  . IR REPLC GASTRO/COLONIC TUBE PERCUT W/FLUORO  02/13/2019  . LEFT HEART CATHETERIZATION WITH CORONARY ANGIOGRAM N/A 08/14/2012   Procedure: LEFT HEART CATHETERIZATION WITH CORONARY ANGIOGRAM;  Surgeon: Peter M Martinique, MD;  Location: [facility] CATH LAB;  Service: Cardiovascular;  Laterality: N/A;    Social History   Socioeconomic History  . Marital status: Married    Spouse name: Not on file  . Number of children: Not on file  . Years of education: Not on file  . Highest education level: Not on file  Occupational History  . Not on file  Social Needs  . Financial resource strain: Not on file  . Food insecurity:    Worry: Not on file    Inability: Not on file  . Transportation needs:    Medical: Not on file  Non-medical: Not on file  Tobacco Use  . Smoking status: Former Smoker    Last attempt to quit: 11/12/1972    Years since quitting: 46.4  . Smokeless tobacco: Never Used  Substance and Sexual Activity  . Alcohol use: No  . Drug use: No  . Sexual activity: Never  Lifestyle  . Physical activity:    Days per week: Not on file    Minutes per session: Not on file  . Stress: Not on file  Relationships  . Social connections:    Talks on phone: Not on file    Gets together: Not on file    Attends religious service: Not on file    Active member of club or organization: Not on file    Attends meetings of  clubs or organizations: Not on file    Relationship status: Not on file  . Intimate partner violence:    Fear of current or ex partner: Not on file    Emotionally abused: Not on file    Physically abused: Not on file    Forced sexual activity: Not on file  Other Topics Concern  . Not on file  Social History Narrative  . Not on file   History reviewed. No pertinent family history.    VITAL SIGNS BP 123/74   Pulse 68   Temp 97.8 F (36.6 C)   Ht 5\' 11"  (1.803 m)   Wt 170 lb 9.6 oz (77.4 kg)   BMI 23.79 kg/m   Outpatient Encounter Medications as of 04/25/2019  Medication Sig  . Amino Acids-Protein Hydrolys (FEEDING SUPPLEMENT, PRO-STAT SUGAR FREE 64,) LIQD Take 30 mLs by mouth 2 (two) times daily.   [name] amLODipine (NORVASC) 5 MG tablet Take 5 mg by mouth daily.   [name] atorvastatin (LIPITOR) 40 MG tablet Take 40 mg by mouth at bedtime.   . baclofen (LIORESAL) 10 MG tablet Take 1 tablet by mouth 2 (two) times daily.   Roseanne Kaufman [place]-Castor Oil (VENELEX) OINT Apply topically to sacrum, coccyx and bilateral buttocks every shift and as needed for prevention  . diphenoxylate-atropine (LOMOTIL) 2.5-0.025 MG tablet Take 2 tablets by mouth every 4 (four) hours as needed for diarrhea or loose stools.  [name] FLUoxetine (PROZAC) 20 MG/5ML solution Take 20 mg by mouth daily.   . hydrocortisone cream (PREPARATION H) 1 % Apply rectally twice daily and as needed for hemorrhoids  . insulin aspart (NOVOLOG FLEXPEN) 100 UNIT/ML FlexPen Give subcutaneous per sliding scale before meals; If blood sugars are less than 60, CALL MD 200-250= 4 units 251-300= 6 units 301-351= 8 units 351-400= 10 units If Blood sugar is greater than 400, call MD  . Insulin Glargine (LANTUS SOLOSTAR) 100 UNIT/ML Solostar Pen Inject 26 Units into the skin every morning.   . lansoprazole (PREVACID SOLUTAB) 30 MG disintegrating tablet Take 30 mg by mouth 2 (two) times daily.   . Melatonin 1 MG TABS Take 2 tablets by mouth at bedtime.    . metoprolol tartrate (LOPRESSOR) 25 MG tablet Place 12.5 mg into feeding tube 2 (two) times daily.  . NON FORMULARY Diet Type:   Initiate puree consistency diet with honey thick liquid  . Ostomy Supplies (SKIN PREP SPRAY) MISC Apply to bilateral heels every shift for prevention  . Pramoxine-Calamine (AVEENO ANTI-ITCH) 1-3 % LOTN Apply to the extremities and back daily and prn for itching/ dry skin. Do not apply to the buttocks wound  . QUEtiapine (SEROQUEL) 25 MG tablet Take 12.5 mg by mouth  at bedtime.   . [DISCONTINUED] diphenoxylate-atropine (LOMOTIL) 2.5-0.025 MG tablet Place 2 tablets into feeding tube every 4 (four) hours as needed for diarrhea or loose stools. (Patient not taking: Reported on 04/25/2019)  . [DISCONTINUED] Water For Irrigation, Sterile (STERILE WATER FOR IRRIGATION) Flush with 240 cc every 6 hours   No facility-administered encounter medications on file as of 04/25/2019.      SIGNIFICANT DIAGNOSTIC EXAMS  LABS REVIEWED PREVIOUS:   01-04-19: urine culture: klebsiella oxytoca  01-16-19: wbc 10.1; hgb 13.9; hct 45.2; mcv 96.2; plt 287; glucose 371; bun 44; creat 1.10; k+ 4.7; na++137; ca 8.7 01-28-19: wbc 8.1; hgb 14.9; hct 48.4; mcv 98.;4 plt 282; glucose 285; bun 38; creat 1.17; k+ 4.3 na++ 143 ca 9.3  01-29-19: glucose 263; bun 39; creat 1.17;  k+ 4.0; na++ 139; ca 9.0; ast 52; alt 59  albumin 2.7 hgb a1c 7.6; chol 116; ldl 50; trig 236; hdl 19  03-13-19: wbc 9.3 hgb 14.4; hct 45.0; mcv 93.2; plt 270; chol 106; ldl 58 trig 124; hdl 23; urine micro-albumin 2236.0   NO NEW LABS.   Review of Systems  Reason unable to perform ROS: expressive aphasia     Physical Exam Constitutional:      General: He is not in acute distress.    Appearance: He is well-developed. He is not diaphoretic.  Neck:     Musculoskeletal: Neck supple.     Thyroid: No thyromegaly.  Cardiovascular:     Rate and Rhythm: Normal rate and regular rhythm.     Pulses: Normal pulses.     Heart sounds:  Normal heart sounds.  Pulmonary:     Effort: Pulmonary effort is normal. No respiratory distress.     Breath sounds: Normal breath sounds.  Abdominal:     General: Bowel sounds are normal. There is no distension.     Palpations: Abdomen is soft.     Tenderness: There is no abdominal tenderness.     Comments: Pet tube present   Genitourinary:    Comments: Foley with small amount of bleed present at meatus with nonpurulent drainage present.  Small penile tear present Small swelling present  Musculoskeletal:     Right lower leg: No edema.     Left lower leg: No edema.     Comments: Right side hemiparesis Able to move left extremities      Lymphadenopathy:     Cervical: No cervical adenopathy.  Skin:    General: Skin is warm and dry.  Neurological:     Mental Status: He is alert. Mental status is at baseline.  Psychiatric:        Mood and Affect: Mood normal.       ASSESSMENT/ PLAN:  TODAY:   1.  Urinary retention 2. Problem with foley catheter, initial encounter 3. Foley catheter in place 4. Penis injury initial encounter  More than likely this represents trauma  He has pulled off the anchoring device;  Will place steri-strips around meatus to provide tissue support Will replace anchor and will monitor his status Will elevate his penis to help with swelling  MD is aware of resident's narcotic use and is in agreement with current plan of care. We will attempt to wean resident as apropriate   Ok Edwards NP [facility] Adult Medicine  Contact [phone] Monday through Friday 8am- 5pm  After hours call [phone]

## 2019-05-07 ENCOUNTER — Encounter: Payer: Self-pay | Admitting: Adult Health

## 2019-05-07 ENCOUNTER — Non-Acute Institutional Stay (SKILLED_NURSING_FACILITY): Payer: Medicare Other | Admitting: Adult Health

## 2019-05-07 DIAGNOSIS — S3994XS Unspecified injury of external genitals, sequela: Secondary | ICD-10-CM | POA: Diagnosis not present

## 2019-05-07 DIAGNOSIS — T839XXS Unspecified complication of genitourinary prosthetic device, implant and graft, sequela: Secondary | ICD-10-CM

## 2019-05-07 DIAGNOSIS — Z978 Presence of other specified devices: Secondary | ICD-10-CM

## 2019-05-07 DIAGNOSIS — Z960 Presence of urogenital implants: Secondary | ICD-10-CM

## 2019-05-07 DIAGNOSIS — R339 Retention of urine, unspecified: Secondary | ICD-10-CM

## 2019-05-07 NOTE — Progress Notes (Signed)
Location:   [place] Room Number: 130 P Place of Service:  SNF (31)   CODE STATUS: Full Code  No Known Allergies  Chief Complaint  Patient presents with  . Acute Visit    Penile Drainage    HPI:  He has been pulling his foley anchor off; causing a penis tear. He does have purulent drainage present. There are no reports of fevers; no indications of pain present.   Past Medical History:  Diagnosis Date  . A-fib ([place])    a. Post-op afib after CABG 08/2012.  [name] Acute gastric ulcer with hemorrhage   . Acute respiratory failure with hypoxia ([place])   . Aphasia following cerebral infarction   . Atrial fibrillation ([place])   . CAD (coronary artery disease)    a. NSTEMI s/p stent to distal RCA 03/2000. b. Inf MI s/p emergent thrombectomy/stenting mid RCA 10/2000. c. NSTEMI s/p CABGx4 (LIMA-LAD, SVG-OM2, seq SVG-acute marginal and PD) 12/18/38 - course complicated by confusion, post-op AF, L pleural effusion with thoracentesis. d. Normal LV function by echo 08/2012.  . Diabetes mellitus   . Diverticulosis   . Dysphagia following cerebral infarction   . Encephalopathy   . Extended spectrum beta lactamase (ESBL) resistance   . Generalized anxiety disorder   . GERD (gastroesophageal reflux disease)   . Hemiplegia and hemiparesis following cerebral infarction affecting right dominant side ([place])   . Hiatal hernia   . HLD (hyperlipidemia)   . HTN (hypertension)   . Non-ST elevation (NSTEMI) myocardial infarction ([place])   . Nontraumatic intracerebral hemorrhage in hemisphere, subcortical ([place])   . Peripheral vascular disease ([place])    a. Carotid dopplers neg 08/13/12. b. Pre-cabg ABIs - R=0.87 suggesting mild dz, L=1.29 possibly falsely elevated due to calcified vessels.  . Pleural effusion    a. L pleural eff after CABG s/p thoracentesis 08/22/12.  . Prostate cancer [facility])    Status post radiation treatment.  . Prostatic hypertrophy    a. Hx of urinary retention,  awaiting TURP.  [name] Severe protein-calorie malnutrition ([place])   . Tobacco abuse   . 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 Valvular heart disease    a. Mild  MR by TEE 08/2012.    Past Surgical History:  Procedure Laterality Date  . APPENDECTOMY    . BACK SURGERY    . CORONARY ARTERY BYPASS GRAFT  08/16/2012   Procedure: CORONARY ARTERY BYPASS GRAFTING (CABG);  Surgeon: Melrose Nakayama, MD;  Location: [place];  Service: Open Heart Surgery;  Laterality: N/A;  . IR REPLC GASTRO/COLONIC TUBE PERCUT W/FLUORO  02/13/2019  . LEFT HEART CATHETERIZATION WITH CORONARY ANGIOGRAM N/A 08/14/2012   Procedure: LEFT HEART CATHETERIZATION WITH CORONARY ANGIOGRAM;  Surgeon: Peter M Martinique, MD;  Location: [facility] CATH LAB;  Service: Cardiovascular;  Laterality: N/A;    Social History   Socioeconomic History  . Marital status: Married    Spouse name: Not on file  . Number of children: Not on file  . Years of education: Not on file  . Highest education level: Not on file  Occupational History  . Not on file  Social Needs  . Financial resource strain: Not on file  . Food insecurity:    Worry: Not on file    Inability: Not on file  . Transportation needs:    Medical: Not on file    Non-medical: Not on file  Tobacco Use  . Smoking status: Former Smoker  Last attempt to quit: 11/12/1972    Years since quitting: 46.5  . Smokeless tobacco: Never Used  Substance and Sexual Activity  . Alcohol use: No  . Drug use: No  . Sexual activity: Never  Lifestyle  . Physical activity:    Days per week: Not on file    Minutes per session: Not on file  . Stress: Not on file  Relationships  . Social connections:    Talks on phone: Not on file    Gets together: Not on file    Attends religious service: Not on file    Active member of club or organization: Not on file    Attends meetings of clubs or organizations: Not on file    Relationship status: Not on file  . Intimate partner violence:     Fear of current or ex partner: Not on file    Emotionally abused: Not on file    Physically abused: Not on file    Forced sexual activity: Not on file  Other Topics Concern  . Not on file  Social History Narrative  . Not on file   History reviewed. No pertinent family history.    VITAL SIGNS BP (!) 147/72   Pulse 86   Temp 97.7 F (36.5 C)   Resp (!) 22   Ht 5\' 11"  (1.803 m)   Wt 170 lb 9.6 oz (77.4 kg)   BMI 23.79 kg/m   Outpatient Encounter Medications as of 05/07/2019  Medication Sig  . Amino Acids-Protein Hydrolys (FEEDING SUPPLEMENT, PRO-STAT SUGAR FREE 64,) LIQD Take 30 mLs by mouth 2 (two) times daily.   [name] amLODipine (NORVASC) 5 MG tablet Take 5 mg by mouth daily.   [name] atorvastatin (LIPITOR) 40 MG tablet Take 40 mg by mouth at bedtime.   . baclofen (LIORESAL) 10 MG tablet Take 1 tablet by mouth 2 (two) times daily.   Roseanne Kaufman [place]-Castor Oil (VENELEX) OINT Apply topically to sacrum, coccyx and bilateral buttocks every shift and as needed for prevention  . diphenoxylate-atropine (LOMOTIL) 2.5-0.025 MG tablet Take 2 tablets by mouth every 4 (four) hours as needed for diarrhea or loose stools.  [name] FLUoxetine (PROZAC) 20 MG/5ML solution Take 20 mg by mouth daily.   . hydrocortisone cream (PREPARATION H) 1 % Apply rectally twice daily and as needed for hemorrhoids  . insulin aspart (NOVOLOG FLEXPEN) 100 UNIT/ML FlexPen Give subcutaneous per sliding scale before meals; If blood sugars are less than 60, CALL MD 200-250= 4 units 251-300= 6 units 301-351= 8 units 351-400= 10 units If Blood sugar is greater than 400, call MD  . Insulin Glargine (LANTUS SOLOSTAR) 100 UNIT/ML Solostar Pen Inject 26 Units into the skin every morning.   . lansoprazole (PREVACID SOLUTAB) 30 MG disintegrating tablet Take 30 mg by mouth 2 (two) times daily.   . Melatonin 1 MG TABS Take 2 tablets by mouth at bedtime.   . metoprolol tartrate (LOPRESSOR) 25 MG tablet Take 12.5 mg by mouth 2 (two) times  daily.   . NON FORMULARY Diet Type:   Initiate puree consistency diet with honey thick liquid  . Ostomy Supplies (SKIN PREP SPRAY) MISC Apply to bilateral heels every shift for prevention  . Pramoxine-Calamine (AVEENO ANTI-ITCH) 1-3 % LOTN Apply to the extremities and back daily and prn for itching/ dry skin. Do not apply to the buttocks wound  . Probiotic Product (RISA-BID PROBIOTIC) TABS Take 1 tablet by mouth 2 (two) times a day.  [name] QUEtiapine (SEROQUEL) 25  MG tablet Take 12.5 mg by mouth at bedtime.    No facility-administered encounter medications on file as of 05/07/2019.      SIGNIFICANT DIAGNOSTIC EXAMS  LABS REVIEWED PREVIOUS:   01-04-19: urine culture: klebsiella oxytoca  01-16-19: wbc 10.1; hgb 13.9; hct 45.2; mcv 96.2; plt 287; glucose 371; bun 44; creat 1.10; k+ 4.7; na++137; ca 8.7 01-28-19: wbc 8.1; hgb 14.9; hct 48.4; mcv 98.;4 plt 282; glucose 285; bun 38; creat 1.17; k+ 4.3 na++ 143 ca 9.3  01-29-19: glucose 263; bun 39; creat 1.17;  k+ 4.0; na++ 139; ca 9.0; ast 52; alt 59  albumin 2.7 hgb a1c 7.6; chol 116; ldl 50; trig 236; hdl 19  03-13-19: wbc 9.3 hgb 14.4; hct 45.0; mcv 93.2; plt 270; chol 106; ldl 58 trig 124; hdl 23; urine micro-albumin 2236.0   NO NEW LABS.   Review of Systems  Reason unable to perform ROS: expressive aphasia     Physical Exam Constitutional:      General: He is not in acute distress.    Appearance: He is well-developed. He is not diaphoretic.  Neck:     Musculoskeletal: Neck supple.     Thyroid: No thyromegaly.  Cardiovascular:     Rate and Rhythm: Normal rate and regular rhythm.     Pulses: Normal pulses.     Heart sounds: Normal heart sounds.  Pulmonary:     Effort: Pulmonary effort is normal. No respiratory distress.     Breath sounds: Normal breath sounds.  Abdominal:     General: Bowel sounds are normal. There is no distension.     Palpations: Abdomen is soft.     Tenderness: There is no abdominal tenderness.     Comments: Peg  tube present without signs of infection present   Genitourinary:    Comments: Foley present Penile tear present Small amount of purulent drainage present   Has mild swelling present  Musculoskeletal:     Right lower leg: No edema.     Left lower leg: No edema.     Comments: Right side hemiparesis Able to move left extremities       Lymphadenopathy:     Cervical: No cervical adenopathy.  Skin:    General: Skin is warm and dry.  Neurological:     Mental Status: He is alert. Mental status is at baseline.  Psychiatric:        Mood and Affect: Mood normal.     ASSESSMENT/ PLAN:  TODAY:   1. Urinary retention 2. Penis injury sequela 3. Foley catheter in place 4. Problem with foley catheter, sequela  Will setup a urology consult to see if he is appropriate for a s/p catheter Will culture drainage   MD is aware of resident's narcotic use and is in agreement with current plan of care. We will attempt to wean resident as apropriate   Ok Edwards NP [facility] Adult Medicine  Contact [phone] Monday through Friday 8am- 5pm  After hours call [phone]

## 2019-05-08 ENCOUNTER — Encounter: Payer: Self-pay | Admitting: Adult Health

## 2019-05-08 ENCOUNTER — Inpatient Hospital Stay (HOSPITAL_COMMUNITY): Payer: Medicare Other | Attending: Internal Medicine

## 2019-05-08 ENCOUNTER — Non-Acute Institutional Stay (SKILLED_NURSING_FACILITY): Payer: Medicare Other | Admitting: Adult Health

## 2019-05-08 ENCOUNTER — Inpatient Hospital Stay (HOSPITAL_COMMUNITY): Payer: Medicare Other | Attending: Adult Health

## 2019-05-08 DIAGNOSIS — R600 Localized edema: Secondary | ICD-10-CM | POA: Diagnosis not present

## 2019-05-08 NOTE — Progress Notes (Signed)
Location:   [place] Room Number: 130 P Place of Service:  SNF (31)   CODE STATUS: Full Code  No Known Allergies  Chief Complaint  Patient presents with  . Acute Visit    Right Arm Edema    HPI:  He has developed right arm edema. There are no reports of fevers present. His arm is red; with mild warmth present it is tender to touch. The swelling started today.   Past Medical History:  Diagnosis Date  . A-fib ([place])    a. Post-op afib after CABG 08/2012.  [name] Acute gastric ulcer with hemorrhage   . Acute respiratory failure with hypoxia ([place])   . Aphasia following cerebral infarction   . Atrial fibrillation ([place])   . CAD (coronary artery disease)    a. NSTEMI s/p stent to distal RCA 03/2000. b. Inf MI s/p emergent thrombectomy/stenting mid RCA 10/2000. c. NSTEMI s/p CABGx4 (LIMA-LAD, SVG-OM2, seq SVG-acute marginal and PD) 8/0/99 - course complicated by confusion, post-op AF, L pleural effusion with thoracentesis. d. Normal LV function by echo 08/2012.  . Diabetes mellitus   . Diverticulosis   . Dysphagia following cerebral infarction   . Encephalopathy   . Extended spectrum beta lactamase (ESBL) resistance   . Generalized anxiety disorder   . GERD (gastroesophageal reflux disease)   . Hemiplegia and hemiparesis following cerebral infarction affecting right dominant side ([place])   . Hiatal hernia   . HLD (hyperlipidemia)   . HTN (hypertension)   . Non-ST elevation (NSTEMI) myocardial infarction ([place])   . Nontraumatic intracerebral hemorrhage in hemisphere, subcortical ([place])   . Peripheral vascular disease ([place])    a. Carotid dopplers neg 08/13/12. b. Pre-cabg ABIs - R=0.87 suggesting mild dz, L=1.29 possibly falsely elevated due to calcified vessels.  . Pleural effusion    a. L pleural eff after CABG s/p thoracentesis 08/22/12.  . Prostate cancer [facility])    Status post radiation treatment.  . Prostatic hypertrophy    a. Hx of urinary retention,  awaiting TURP.  [name] Severe protein-calorie malnutrition ([place])   . Tobacco abuse   . 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 Valvular heart disease    a. Mild  MR by TEE 08/2012.    Past Surgical History:  Procedure Laterality Date  . APPENDECTOMY    . BACK SURGERY    . CORONARY ARTERY BYPASS GRAFT  08/16/2012   Procedure: CORONARY ARTERY BYPASS GRAFTING (CABG);  Surgeon: Melrose Nakayama, MD;  Location: [place];  Service: Open Heart Surgery;  Laterality: N/A;  . IR REPLC GASTRO/COLONIC TUBE PERCUT W/FLUORO  02/13/2019  . LEFT HEART CATHETERIZATION WITH CORONARY ANGIOGRAM N/A 08/14/2012   Procedure: LEFT HEART CATHETERIZATION WITH CORONARY ANGIOGRAM;  Surgeon: Peter M Martinique, MD;  Location: [facility] CATH LAB;  Service: Cardiovascular;  Laterality: N/A;    Social History   Socioeconomic History  . Marital status: Married    Spouse name: Not on file  . Number of children: Not on file  . Years of education: Not on file  . Highest education level: Not on file  Occupational History  . Not on file  Social Needs  . Financial resource strain: Not on file  . Food insecurity:    Worry: Not on file    Inability: Not on file  . Transportation needs:    Medical: Not on file    Non-medical: Not on file  Tobacco Use  . Smoking status: Former Smoker  Last attempt to quit: 11/12/1972    Years since quitting: 46.5  . Smokeless tobacco: Never Used  Substance and Sexual Activity  . Alcohol use: No  . Drug use: No  . Sexual activity: Never  Lifestyle  . Physical activity:    Days per week: Not on file    Minutes per session: Not on file  . Stress: Not on file  Relationships  . Social connections:    Talks on phone: Not on file    Gets together: Not on file    Attends religious service: Not on file    Active member of club or organization: Not on file    Attends meetings of clubs or organizations: Not on file    Relationship status: Not on file  . Intimate partner violence:     Fear of current or ex partner: Not on file    Emotionally abused: Not on file    Physically abused: Not on file    Forced sexual activity: Not on file  Other Topics Concern  . Not on file  Social History Narrative  . Not on file   History reviewed. No pertinent family history.    VITAL SIGNS BP (!) 147/72   Pulse 86   Temp 98.5 F (36.9 C)   Resp (!) 22   Ht 5\' 11"  (1.803 m)   Wt 170 lb 9.6 oz (77.4 kg)   BMI 23.79 kg/m   Outpatient Encounter Medications as of 05/08/2019  Medication Sig  . Amino Acids-Protein Hydrolys (FEEDING SUPPLEMENT, PRO-STAT SUGAR FREE 64,) LIQD Take 30 mLs by mouth 2 (two) times daily.   [name] amLODipine (NORVASC) 5 MG tablet Take 5 mg by mouth daily.   [name] atorvastatin (LIPITOR) 40 MG tablet Take 40 mg by mouth at bedtime.   . baclofen (LIORESAL) 10 MG tablet Take 1 tablet by mouth 2 (two) times daily.   Roseanne Kaufman Peru-Castor Oil (VENELEX) OINT Apply topically to sacrum, coccyx and bilateral buttocks every shift and as needed for prevention  . diphenoxylate-atropine (LOMOTIL) 2.5-0.025 MG tablet Take 2 tablets by mouth every 4 (four) hours as needed for diarrhea or loose stools.  [name] FLUoxetine (PROZAC) 20 MG/5ML solution Take 20 mg by mouth daily.   . hydrocortisone cream (PREPARATION H) 1 % Apply rectally twice daily and as needed for hemorrhoids  . insulin aspart (NOVOLOG FLEXPEN) 100 UNIT/ML FlexPen Give subcutaneous per sliding scale before meals; If blood sugars are less than 60, CALL MD 200-250= 4 units 251-300= 6 units 301-351= 8 units 351-400= 10 units If Blood sugar is greater than 400, call MD  . Insulin Glargine (LANTUS SOLOSTAR) 100 UNIT/ML Solostar Pen Inject 26 Units into the skin every morning.   . lansoprazole (PREVACID SOLUTAB) 30 MG disintegrating tablet Take 30 mg by mouth 2 (two) times daily.   . Melatonin 1 MG TABS Take 2 tablets by mouth at bedtime.   . metoprolol tartrate (LOPRESSOR) 25 MG tablet Take 12.5 mg by mouth 2 (two) times  daily.   . NON FORMULARY Diet Type:   Initiate puree consistency diet with honey thick liquid  . Ostomy Supplies (SKIN PREP SPRAY) MISC Apply to bilateral heels every shift for prevention  . Pramoxine-Calamine (AVEENO ANTI-ITCH) 1-3 % LOTN Apply to the extremities and back daily and prn for itching/ dry skin. Do not apply to the buttocks wound  . Probiotic Product (RISA-BID PROBIOTIC) TABS Take 1 tablet by mouth 2 (two) times a day.  [name] QUEtiapine (SEROQUEL) 25  MG tablet Take 12.5 mg by mouth at bedtime.    No facility-administered encounter medications on file as of 05/08/2019.      SIGNIFICANT DIAGNOSTIC EXAMS  LABS REVIEWED PREVIOUS:   01-04-19: urine culture: klebsiella oxytoca  01-16-19: wbc 10.1; hgb 13.9; hct 45.2; mcv 96.2; plt 287; glucose 371; bun 44; creat 1.10; k+ 4.7; na++137; ca 8.7 01-28-19: wbc 8.1; hgb 14.9; hct 48.4; mcv 98.;4 plt 282; glucose 285; bun 38; creat 1.17; k+ 4.3 na++ 143 ca 9.3  01-29-19: glucose 263; bun 39; creat 1.17;  k+ 4.0; na++ 139; ca 9.0; ast 52; alt 59  albumin 2.7 hgb a1c 7.6; chol 116; ldl 50; trig 236; hdl 19  03-13-19: wbc 9.3 hgb 14.4; hct 45.0; mcv 93.2; plt 270; chol 106; ldl 58 trig 124; hdl 23; urine micro-albumin 2236.0   NO NEW LABS.   Review of Systems  Reason unable to perform ROS: expressive aphasia     Physical Exam Constitutional:      General: He is not in acute distress.    Appearance: He is well-developed. He is not diaphoretic.  Neck:     Musculoskeletal: Neck supple.     Thyroid: No thyromegaly.  Cardiovascular:     Rate and Rhythm: Normal rate and regular rhythm.     Pulses: Normal pulses.     Heart sounds: Normal heart sounds.  Pulmonary:     Effort: Pulmonary effort is normal. No respiratory distress.     Breath sounds: Normal breath sounds.  Abdominal:     General: Bowel sounds are normal. There is no distension.     Palpations: Abdomen is soft.     Tenderness: There is no abdominal tenderness.     Comments: Peg  tube present without signs of infection present   Genitourinary:    Comments: Foley present  Penile tear present Small amount of purulent drainage present   Has mild swelling present  Musculoskeletal:     Right lower leg: No edema.     Left lower leg: No edema.     Comments: Right side hemiparesis Able to move left extremities    Right arm: swelling with firmness present on right elbow mild warmth and tenderness present   Lymphadenopathy:     Cervical: No cervical adenopathy.  Skin:    General: Skin is warm and dry.  Neurological:     Mental Status: He is alert. Mental status is at baseline.  Psychiatric:        Mood and Affect: Mood normal.     ASSESSMENT/ PLAN:  TODAY:   1. Right arm edema: is worse: will get an x-ray of right elbow and forearm; and venous dopplers.    MD is aware of resident's narcotic use and is in agreement with current plan of care. We will attempt to wean resident as apropriate   Ok Edwards NP [facility]  Contact [phone] Monday through Friday 8am- 5pm  After hours call [phone]

## 2019-05-09 ENCOUNTER — Non-Acute Institutional Stay (SKILLED_NURSING_FACILITY): Payer: Medicare Other | Admitting: Internal Medicine

## 2019-05-09 ENCOUNTER — Encounter: Payer: Self-pay | Admitting: Internal Medicine

## 2019-05-09 DIAGNOSIS — R339 Retention of urine, unspecified: Secondary | ICD-10-CM

## 2019-05-09 DIAGNOSIS — E1169 Type 2 diabetes mellitus with other specified complication: Secondary | ICD-10-CM

## 2019-05-09 DIAGNOSIS — I10 Essential (primary) hypertension: Secondary | ICD-10-CM

## 2019-05-09 DIAGNOSIS — I152 Hypertension secondary to endocrine disorders: Secondary | ICD-10-CM

## 2019-05-09 DIAGNOSIS — I615 Nontraumatic intracerebral hemorrhage, intraventricular: Secondary | ICD-10-CM | POA: Diagnosis not present

## 2019-05-09 DIAGNOSIS — E785 Hyperlipidemia, unspecified: Secondary | ICD-10-CM

## 2019-05-09 DIAGNOSIS — E1151 Type 2 diabetes mellitus with diabetic peripheral angiopathy without gangrene: Secondary | ICD-10-CM | POA: Diagnosis not present

## 2019-05-09 DIAGNOSIS — E1159 Type 2 diabetes mellitus with other circulatory complications: Secondary | ICD-10-CM | POA: Diagnosis not present

## 2019-05-09 NOTE — Progress Notes (Signed)
Location:  [place] Room Number: 130 P Place of Service:  SNF (31) Provider:  Veleta Miners, MD  No primary care provider on file.  Patient Care Team: [name], MD as Consulting Physician (Urology)  Extended Emergency Contact Information Primary Emergency Contact: Gurganus,[name] Address: [address]          [place] [address] [name] of [place] Phone: [phone] Mobile Phone: [phone] Relation: Spouse Secondary Emergency Contact: [name] of [place] Phone: [phone] Relation: Son  Code Status:  Full Code Goals of care: Advanced Directive information Advanced Directives 05/09/2019  Does Patient Have a Medical Advance Directive? Yes  Type of Advance Directive Out of facility DNR (pink MOST or yellow form)  Does patient want to make changes to medical advance directive? No - Patient declined  Would patient like information on creating a medical advance directive? No - Patient declined  Pre-existing out of facility DNR order (yellow form or pink MOST form) Pink MOST form placed in chart (order not valid for inpatient use)     Chief Complaint  Patient presents with  . Medical Management of Chronic Issues    Hypertension, Diabetes Mellitus type 2    HPI:  Pt is a 73 y.o. male seen today for medical management of chronic diseases.    Patient has been in SNF after staying in the [facility] hospital from 12/08-01/10 with Acutenontraumatic subcortical hemorrhage of left hemisphere. His stay was complicated by Aspiration Pneumonia requiring PEG tube placement, Urinary Retention with Foley catheter, Sacral Pressure wound, Gastric ulcer and Restlessness.Proctitis and ESBL Bacteremia  Patient since has starting eating . His sacral Wound is healed. He continues s to have Aphasia. Continues to have Right Hemiparesis. Though moves his LE  Unable to get up or walk and complete dependent for his  ADLS. He has been  Inappropriate with the Nursing staff   He also has Tear in Tip of his penis with Drainage and urology Consult is pending. Patient unable to give any history. He mumbles. Follows Some commands. Follows with his Eyes No New Issues per Nurses Weight is stable on PO Intake  Past Medical History:  Diagnosis Date  . A-fib ([place])    a. Post-op afib after CABG 08/2012.  [name] Acute gastric ulcer with hemorrhage   . Acute respiratory failure with hypoxia ([place])   . Aphasia following cerebral infarction   . Atrial fibrillation ([place])   . CAD (coronary artery disease)    a. NSTEMI s/p stent to distal RCA 03/2000. b. Inf MI s/p emergent thrombectomy/stenting mid RCA 10/2000. c. NSTEMI s/p CABGx4 (LIMA-LAD, SVG-OM2, seq SVG-acute marginal and PD) 05/14/86 - course complicated by confusion, post-op AF, L pleural effusion with thoracentesis. d. Normal LV function by echo 08/2012.  . Diabetes mellitus   . Diverticulosis   . Dysphagia following cerebral infarction   . Encephalopathy   . Extended spectrum beta lactamase (ESBL) resistance   . Generalized anxiety disorder   . GERD (gastroesophageal reflux disease)   . Hemiplegia and hemiparesis following cerebral infarction affecting right dominant side ([place])   . Hiatal hernia   . HLD (hyperlipidemia)   . HTN (hypertension)   . Non-ST elevation (NSTEMI) myocardial infarction ([place])   . Nontraumatic intracerebral hemorrhage in hemisphere, subcortical ([place])   . Peripheral vascular disease ([place])    a. Carotid dopplers neg 08/13/12. b. Pre-cabg ABIs - R=0.87 suggesting mild dz, L=1.29 possibly falsely elevated due to calcified vessels.  [name]  Pleural effusion    a. L pleural eff after CABG s/p thoracentesis 08/22/12.  . Prostate cancer [facility])    Status post radiation treatment.  . Prostatic hypertrophy    a. Hx of urinary retention, awaiting TURP.  [name] Severe protein-calorie malnutrition ([place])   . Tobacco abuse   . 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 Valvular heart disease    a. Mild  MR by TEE 08/2012.   Past Surgical History:  Procedure Laterality Date  . APPENDECTOMY    . BACK SURGERY    . CORONARY ARTERY BYPASS GRAFT  08/16/2012   Procedure: CORONARY ARTERY BYPASS GRAFTING (CABG);  Surgeon: Melrose Nakayama, MD;  Location: [place];  Service: Open Heart Surgery;  Laterality: N/A;  . IR REPLC GASTRO/COLONIC TUBE PERCUT W/FLUORO  02/13/2019  . LEFT HEART CATHETERIZATION WITH CORONARY ANGIOGRAM N/A 08/14/2012   Procedure: LEFT HEART CATHETERIZATION WITH CORONARY ANGIOGRAM;  Surgeon: Peter M Martinique, MD;  Location: [facility] CATH LAB;  Service: Cardiovascular;  Laterality: N/A;    No Known Allergies  Outpatient Encounter Medications as of 05/09/2019  Medication Sig  . Amino Acids-Protein Hydrolys (FEEDING SUPPLEMENT, PRO-STAT SUGAR FREE 64,) LIQD Take 30 mLs by mouth 2 (two) times daily.   [name] amLODipine (NORVASC) 5 MG tablet Take 5 mg by mouth daily.   [name] atorvastatin (LIPITOR) 40 MG tablet Take 40 mg by mouth at bedtime.   . baclofen (LIORESAL) 10 MG tablet Take 1 tablet by mouth 2 (two) times daily.   Roseanne Kaufman [place]-Castor Oil (VENELEX) OINT Apply topically to sacrum, coccyx and bilateral buttocks every shift and as needed for prevention  . diphenoxylate-atropine (LOMOTIL) 2.5-0.025 MG tablet Take 2 tablets by mouth every 4 (four) hours as needed for diarrhea or loose stools.  [name] FLUoxetine (PROZAC) 20 MG/5ML solution Take 20 mg by mouth daily.   . hydrocortisone cream (PREPARATION H) 1 % Apply rectally twice daily and as needed for hemorrhoids  . insulin aspart (NOVOLOG FLEXPEN) 100 UNIT/ML FlexPen Give subcutaneous per sliding scale before meals; If blood sugars are less than 60, CALL MD 200-250= 4 units 251-300= 6 units 301-351= 8 units 351-400= 10 units If Blood sugar is greater than 400, call MD  . Insulin Glargine (LANTUS SOLOSTAR) 100 UNIT/ML Solostar Pen Inject 26 Units into the skin every morning.   . lansoprazole (PREVACID SOLUTAB)  30 MG disintegrating tablet Take 30 mg by mouth 2 (two) times daily.   . Melatonin 1 MG TABS Take 2 tablets by mouth at bedtime.   . metoprolol tartrate (LOPRESSOR) 25 MG tablet Take 12.5 mg by mouth 2 (two) times daily.   . NON FORMULARY Diet Type:   Initiate puree consistency diet with honey thick liquid  . Ostomy Supplies (SKIN PREP SPRAY) MISC Apply to bilateral heels every shift for prevention  . Pramoxine-Calamine (AVEENO ANTI-ITCH) 1-3 % LOTN Apply to the extremities and back daily and prn for itching/ dry skin. Do not apply to the buttocks wound  . Probiotic Product (RISA-BID PROBIOTIC) TABS Take 1 tablet by mouth 2 (two) times a day.  [name] QUEtiapine (SEROQUEL) 25 MG tablet Take 12.5 mg by mouth at bedtime.    No facility-administered encounter medications on file as of 05/09/2019.     Review of Systems  Unable to perform ROS: Other     There is no immunization history on file for this patient. Pertinent  Health Maintenance Due  Topic Date Due  . OPHTHALMOLOGY EXAM  06/08/2019 (  Originally 06/14/1956)  . COLONOSCOPY  06/08/2019 (Originally 06/14/1994)  . PNA vac Low Risk Adult (1 of 2 - PCV13) 06/08/2019 (Originally 06/14/2009)  . FOOT EXAM  06/10/2019 (Originally 06/14/1956)  . INFLUENZA VACCINE  07/13/2019  . HEMOGLOBIN A1C  07/30/2019  . URINE MICROALBUMIN  03/12/2020   No flowsheet data found. Functional Status Survey:    Vitals:   05/09/19 1004  BP: (!) 142/72  Pulse: 86  Resp: (!) 22  Temp: (!) 97.3 F (36.3 C)  Weight: 170 lb 9.6 oz (77.4 kg)  Height: 5\' 11"  (1.803 m)   Body mass index is 23.79 kg/m. Physical Exam Vitals signs reviewed.  Constitutional:      Appearance: Normal appearance.  HENT:     Head: Normocephalic.     Nose: Nose normal.     Mouth/Throat:     Mouth: Mucous membranes are moist.     Pharynx: Oropharynx is clear.  Eyes:     Pupils: Pupils are equal, round, and reactive to light.  Neck:     Musculoskeletal: Neck supple.  Cardiovascular:      Rate and Rhythm: Normal rate and regular rhythm.     Pulses: Normal pulses.  Pulmonary:     Effort: Pulmonary effort is normal. No respiratory distress.     Breath sounds: Normal breath sounds. No wheezing.  Abdominal:     General: Abdomen is flat. Bowel sounds are normal. There is no distension.     Palpations: Abdomen is soft.     Tenderness: There is no abdominal tenderness.  Musculoskeletal:     Comments: No LE edema Does have Swelling in his Right Arm  Skin:    General: Skin is warm and dry.  Neurological:     Mental Status: He is alert.     Comments: Has right Hemiparesis with Expressive Aphasia Follows Some Commands but mostly mumbles and follows with his eyes  Psychiatric:        Mood and Affect: Mood normal.        Thought Content: Thought content normal.     Labs reviewed: Recent Labs    01/16/19 0700 01/28/19 0730 01/29/19 0700  NA 137 143 139  K 4.7 4.3 4.0  CL 99 103 101  CO2 31 33* 30  GLUCOSE 371* 285* 263*  BUN 44* 38* 39*  CREATININE 1.10 1.17 1.17  CALCIUM 8.7* 9.2 9.0   Recent Labs    01/04/19 1445 01/29/19 0700  AST 33 52*  ALT 47* 59*  ALKPHOS 132* 121  BILITOT 0.8 0.6  PROT 6.6 6.0*  ALBUMIN 2.7* 2.7*   Recent Labs    12/24/18 0850  01/05/19 0715  01/16/19 0700 01/28/19 0730 03/13/19 0700  WBC 7.4   < > 6.6   < > 10.1 8.1 9.3  NEUTROABS 5.0  --  5.1  --  7.4  --   --   HGB 13.7   < > 13.8   < > 13.9 14.9 14.4  HCT 43.7   < > 44.3   < > 45.2 48.4 45.0  MCV 94.6   < > 95.7   < > 96.2 98.4 93.2  PLT 271   < > 291   < > 287 282 270   < > = values in this interval not displayed.   No results found for: TSH Lab Results  Component Value Date   HGBA1C 7.6 (H) 01/29/2019   Lab Results  Component Value Date   CHOL 106 03/13/2019  HDL 23 (L) 03/13/2019   LDLCALC 58 03/13/2019   TRIG 124 03/13/2019   CHOLHDL 4.6 03/13/2019    Significant Diagnostic Results in last 30 days:  Dg Elbow Complete Right (3+view)  Result Date:  05/09/2019 CLINICAL DATA:  Pain and swelling EXAM: RIGHT ELBOW - COMPLETE 3+ VIEW COMPARISON:  None. FINDINGS: No fracture or malalignment. Diffuse soft tissue swelling. No significant elbow effusion. IMPRESSION: Soft tissue swelling without acute osseous abnormality. Electronically Signed   By: Donavan Foil M.D.   On: 05/09/2019 01:04   Dg Forearm Right  Result Date: 05/09/2019 CLINICAL DATA:  Elbow and forearm swelling and pain EXAM: RIGHT FOREARM - 2 VIEW COMPARISON:  None. FINDINGS: No fracture or malalignment. Vascular calcifications. Diffuse soft tissue swelling without radiopaque foreign body or soft tissue emphysema. IMPRESSION: No acute osseous abnormality Electronically Signed   By: Donavan Foil M.D.   On: 05/09/2019 01:03    Assessment/Plan Type 2 diabetes mellitus  His BS are mostly stable Check 3 day Will discontinue the Bolus with Supper.  just continue with lunch Repeat A1C Microalbuminuria was very High Will start him on Valsartan Repeat BMP in 2 weeks Hypertension  Continue on Norvasc and Lopressor Valsartan also started due to Diabetes  Dyslipidemia associated with type 2 diabetes mellitus (HCC) On Statin LDL less then 100  IVH (intraventricular hemorrhage)  Patient is not working with therapy Stays Complete dependent for his ADLS Right hemiparesis On Baclofen Right Arm Swelling Most likely dependent Edema Xray was negative Awaiting Dopplers Raise the Arm above pillow. Per nurses he usually throws everything from his bed  Urinary retention with Chronic Foley Follows with Urology Appointment Pending H/o Gastric Ulcer Continue on Protonix S/P Peg Tube Not using for any feed Weight staying stable Will refer him to GI for removal Behavior issues Continue on Seroquel at night Previous Taper has not help  Family/ staff Communication:   Labs/tests ordered:  CMP,CBC, Hba1C in 2 weeks  Total time spent in this patient care encounter was  45_  minutes;  greater than 50% of the visit spent counseling  staff, reviewing records , Labs and coordinating care for problems addressed at this encounter.

## 2019-05-14 ENCOUNTER — Ambulatory Visit (HOSPITAL_COMMUNITY)
Admission: RE | Admit: 2019-05-14 | Discharge: 2019-05-14 | Disposition: A | Discharge status: Home / Self Care | Payer: Medicare Other | Source: Ambulatory Visit | Attending: Internal Medicine | Admitting: Internal Medicine

## 2019-05-14 DIAGNOSIS — M7989 Other specified soft tissue disorders: Secondary | ICD-10-CM | POA: Diagnosis present

## 2019-05-14 DIAGNOSIS — M79601 Pain in right arm: Secondary | ICD-10-CM | POA: Diagnosis not present

## 2019-05-23 ENCOUNTER — Encounter: Payer: Self-pay | Admitting: Internal Medicine

## 2019-05-23 ENCOUNTER — Other Ambulatory Visit (HOSPITAL_COMMUNITY)
Admission: RE | Admit: 2019-05-23 | Discharge: 2019-05-23 | Disposition: A | Discharge status: Home / Self Care | Payer: Medicare Other | Source: Skilled Nursing Facility | Attending: Internal Medicine | Admitting: Internal Medicine

## 2019-05-23 ENCOUNTER — Non-Acute Institutional Stay (SKILLED_NURSING_FACILITY): Payer: Medicare Other | Admitting: Internal Medicine

## 2019-05-23 DIAGNOSIS — Z794 Long term (current) use of insulin: Secondary | ICD-10-CM

## 2019-05-23 DIAGNOSIS — Z931 Gastrostomy status: Secondary | ICD-10-CM | POA: Diagnosis not present

## 2019-05-23 DIAGNOSIS — E119 Type 2 diabetes mellitus without complications: Secondary | ICD-10-CM | POA: Diagnosis not present

## 2019-05-23 DIAGNOSIS — I610 Nontraumatic intracerebral hemorrhage in hemisphere, subcortical: Secondary | ICD-10-CM

## 2019-05-23 DIAGNOSIS — IMO0001 Reserved for inherently not codable concepts without codable children: Secondary | ICD-10-CM

## 2019-05-23 LAB — COMPREHENSIVE METABOLIC PANEL
ALT: 49 U/L — ABNORMAL HIGH (ref 0–44)
AST: 50 U/L — ABNORMAL HIGH (ref 15–41)
Albumin: 2.6 g/dL — ABNORMAL LOW (ref 3.5–5.0)
Alkaline Phosphatase: 131 U/L — ABNORMAL HIGH (ref 38–126)
Anion gap: 8 (ref 5–15)
BUN: 30 mg/dL — ABNORMAL HIGH (ref 8–23)
CO2: 28 mmol/L (ref 22–32)
Calcium: 8.7 mg/dL — ABNORMAL LOW (ref 8.9–10.3)
Chloride: 104 mmol/L (ref 98–111)
Creatinine, Ser: 1.51 mg/dL — ABNORMAL HIGH (ref 0.61–1.24)
GFR calc Af Amer: 52 mL/min — ABNORMAL LOW (ref >60)
GFR calc non Af Amer: 45 mL/min — ABNORMAL LOW (ref >60)
Glucose, Bld: 202 mg/dL — ABNORMAL HIGH (ref 70–99)
Potassium: 4 mmol/L (ref 3.5–5.1)
Sodium: 140 mmol/L (ref 135–145)
Total Bilirubin: 1 mg/dL (ref 0.3–1.2)
Total Protein: 6.1 g/dL — ABNORMAL LOW (ref 6.5–8.1)

## 2019-05-23 LAB — CBC
HCT: 47.4 % (ref 39.0–52.0)
Hemoglobin: 14.6 g/dL (ref 13.0–17.0)
MCH: 28.7 pg (ref 26.0–34.0)
MCHC: 30.8 g/dL (ref 30.0–36.0)
MCV: 93.1 fL (ref 80.0–100.0)
Platelets: 290 10*3/uL (ref 150–400)
RBC: 5.09 MIL/uL (ref 4.22–5.81)
RDW: 13 % (ref 11.5–15.5)
WBC: 7 10*3/uL (ref 4.0–10.5)
nRBC: 0 % (ref 0.0–0.2)

## 2019-05-23 LAB — HEMOGLOBIN A1C
Hgb A1c MFr Bld: 7.5 % — ABNORMAL HIGH (ref 4.8–5.6)
Mean Plasma Glucose: 168.55 mg/dL

## 2019-05-23 NOTE — Progress Notes (Signed)
Location:  [place] Room Number: 130 P Place of Service:  SNF (31) Provider:  Veleta Miners, MD  Default, Provider, MD  Patient Care Team: Default, Provider, MD as PCP - General Tanner Montgomery, MD as Consulting Physician (Urology)  Extended Emergency Contact Information Primary Emergency Contact: Mantei,[name] Address: [address]          [place] [address] [name] Phone: [phone] Mobile Phone: [phone] Relation: Spouse Secondary Emergency Contact: [name] of [place] Phone: [phone] Relation: Son  Code Status:  Full Code Goals of care: Advanced Directive information Advanced Directives 05/23/2019  Does Patient Have a Medical Advance Directive? Yes  Type of Advance Directive Out of facility DNR (pink MOST or yellow form)  Does patient want to make changes to medical advance directive? No - Patient declined  Would patient like information on creating a medical advance directive? No - Patient declined  Pre-existing out of facility DNR order (yellow form or pink MOST form) Pink MOST form placed in chart (order not valid for inpatient use)     Chief Complaint  Patient presents with   Acute Visit    G Tube Removal    HPI:  Pt is a 73 y.o. male seen today for an acute visit for Peg Tube removal  Patient has been in SNF after staying in the [facility] hospital from 12/08-01/10 with Acutenontraumatic subcortical hemorrhage of left hemisphere. His stay was complicated by Aspiration Pneumonia requiring PEG tube placement, Urinary Retention with Foley catheter, Sacral Pressure wound, Gastric ulcer and Restlessness.Proctitis and ESBLBacteremia  Since Patient is eating well . His weight is stable. And BS are stable We have not used his PEF tube for past 3 months Patient still has Aphasia and doe snot have much understanding of the procedure   Past Medical History:  Diagnosis Date   A-fib  ([place])    a. Post-op afib after CABG 08/2012.   Acute gastric ulcer with hemorrhage    Acute respiratory failure with hypoxia (HCC)    Aphasia following cerebral infarction    Atrial fibrillation (HCC)    CAD (coronary artery disease)    a. NSTEMI s/p stent to distal RCA 03/2000. b. Inf MI s/p emergent thrombectomy/stenting mid RCA 10/2000. c. NSTEMI s/p CABGx4 (LIMA-LAD, SVG-OM2, seq SVG-acute marginal and PD) 03/12/65 - course complicated by confusion, post-op AF, L pleural effusion with thoracentesis. d. Normal LV function by echo 08/2012.   Diabetes mellitus    Diverticulosis    Dysphagia following cerebral infarction    Encephalopathy    Extended spectrum beta lactamase (ESBL) resistance    Generalized anxiety disorder    GERD (gastroesophageal reflux disease)    Hemiplegia and hemiparesis following cerebral infarction affecting right dominant side (HCC)    Hiatal hernia    HLD (hyperlipidemia)    HTN (hypertension)    Non-ST elevation (NSTEMI) myocardial infarction [facility])    Nontraumatic intracerebral hemorrhage in hemisphere, subcortical [facility])    Peripheral vascular disease (HCC)    a. Carotid dopplers neg 08/13/12. b. Pre-cabg ABIs - R=0.87 suggesting mild dz, L=1.29 possibly falsely elevated due to calcified vessels.   Pleural effusion    a. L pleural eff after CABG s/p thoracentesis 08/22/12.   Prostate cancer [facility])    Status post radiation treatment.   Prostatic hypertrophy    a. Hx of urinary retention, awaiting TURP.   Severe protein-calorie malnutrition (HCC)    Tobacco abuse    Unspecified  disorder of adult personality and behavior    Valvular heart disease    a. Mild  MR by TEE 08/2012.   Past Surgical History:  Procedure Laterality Date   APPENDECTOMY     BACK SURGERY     CORONARY ARTERY BYPASS GRAFT  08/16/2012   Procedure: CORONARY ARTERY BYPASS GRAFTING (CABG);  Surgeon: Melrose Nakayama, MD;  Location: [place];  Service: Open Heart  Surgery;  Laterality: N/A;   IR REPLC GASTRO/COLONIC TUBE PERCUT W/FLUORO  02/13/2019   LEFT HEART CATHETERIZATION WITH CORONARY ANGIOGRAM N/A 08/14/2012   Procedure: LEFT HEART CATHETERIZATION WITH CORONARY ANGIOGRAM;  Surgeon: Peter M Martinique, MD;  Location: [facility] CATH LAB;  Service: Cardiovascular;  Laterality: N/A;    No Known Allergies  Outpatient Encounter Medications as of 05/23/2019  Medication Sig   amLODipine (NORVASC) 5 MG tablet Take 5 mg by mouth daily.    atorvastatin (LIPITOR) 40 MG tablet Take 40 mg by mouth at bedtime.    baclofen (LIORESAL) 10 MG tablet Take 1 tablet by mouth 2 (two) times daily.    Balsam Peru-Castor Oil (VENELEX) OINT Apply topically to sacrum, coccyx and bilateral buttocks every shift and as needed for prevention   diphenoxylate-atropine (LOMOTIL) 2.5-0.025 MG tablet Take 2 tablets by mouth every 4 (four) hours as needed for diarrhea or loose stools.   FLUoxetine (PROZAC) 20 MG/5ML solution Take 20 mg by mouth daily.    hydrocortisone cream (PREPARATION H) 1 % Apply rectally twice daily and as needed for hemorrhoids   insulin aspart (NOVOLOG FLEXPEN) 100 UNIT/ML FlexPen Inject 8 Units into the skin daily. Give @ 12:00 pm   Insulin Glargine (LANTUS SOLOSTAR) 100 UNIT/ML Solostar Pen Inject 26 Units into the skin every morning.    lansoprazole (PREVACID SOLUTAB) 30 MG disintegrating tablet Take 30 mg by mouth 2 (two) times daily.    Melatonin 1 MG TABS Take 2 tablets by mouth at bedtime.    metoprolol tartrate (LOPRESSOR) 25 MG tablet Take 12.5 mg by mouth 2 (two) times daily.    NON FORMULARY Diet Type:   Initiate puree consistency diet with honey thick liquid   Ostomy Supplies (SKIN PREP SPRAY) MISC Apply to bilateral heels every shift for prevention   Pramoxine-Calamine (AVEENO ANTI-ITCH) 1-3 % LOTN Apply to the extremities and back daily and prn for itching/ dry skin. Do not apply to the buttocks wound   Probiotic Product (RISA-BID PROBIOTIC)  TABS Take 1 tablet by mouth 2 (two) times a day.   QUEtiapine (SEROQUEL) 25 MG tablet Take 12.5 mg by mouth at bedtime.    valsartan (DIOVAN) 40 MG tablet Take 40 mg by mouth daily.   [DISCONTINUED] Amino Acids-Protein Hydrolys (FEEDING SUPPLEMENT, PRO-STAT SUGAR FREE 64,) LIQD Take 30 mLs by mouth 2 (two) times daily.    No facility-administered encounter medications on file as of 05/23/2019.     Review of Systems   There is no immunization history on file for this patient. Pertinent  Health Maintenance Due  Topic Date Due   OPHTHALMOLOGY EXAM  06/08/2019 (Originally 06/14/1956)   COLONOSCOPY  06/08/2019 (Originally 06/14/1994)   PNA vac Low Risk Adult (1 of 2 - PCV13) 06/08/2019 (Originally 06/14/2009)   FOOT EXAM  06/10/2019 (Originally 06/14/1956)   INFLUENZA VACCINE  07/13/2019   HEMOGLOBIN A1C  07/30/2019   URINE MICROALBUMIN  03/12/2020   No flowsheet data found. Functional Status Survey:    Vitals:   05/23/19 1607  Weight: 166 lb 6.4 oz (75.5 kg)  Height: 5\' 11"  (1.803 m)   Body mass index is 23.21 kg/m. Physical Exam  Labs reviewed: Recent Labs    01/28/19 0730 01/29/19 0700 05/23/19 1037  NA 143 139 140  K 4.3 4.0 4.0  CL 103 101 104  CO2 33* 30 28  GLUCOSE 285* 263* 202*  BUN 38* 39* 30*  CREATININE 1.17 1.17 1.51*  CALCIUM 9.2 9.0 8.7*   Recent Labs    01/04/19 1445 01/29/19 0700 05/23/19 1037  AST 33 52* 50*  ALT 47* 59* 49*  ALKPHOS 132* 121 131*  BILITOT 0.8 0.6 1.0  PROT 6.6 6.0* 6.1*  ALBUMIN 2.7* 2.7* 2.6*   Recent Labs    12/24/18 0850  01/05/19 0715  01/16/19 0700 01/28/19 0730 03/13/19 0700 05/23/19 1037  WBC 7.4   < > 6.6   < > 10.1 8.1 9.3 7.0  NEUTROABS 5.0  --  5.1  --  7.4  --   --   --   HGB 13.7   < > 13.8   < > 13.9 14.9 14.4 14.6  HCT 43.7   < > 44.3   < > 45.2 48.4 45.0 47.4  MCV 94.6   < > 95.7   < > 96.2 98.4 93.2 93.1  PLT 271   < > 291   < > 287 282 270 290   < > = values in this interval not displayed.    No results found for: TSH Lab Results  Component Value Date   HGBA1C 7.5 (H) 05/23/2019   Lab Results  Component Value Date   CHOL 106 03/13/2019   HDL 23 (L) 03/13/2019   LDLCALC 58 03/13/2019   TRIG 124 03/13/2019   CHOLHDL 4.6 03/13/2019    Significant Diagnostic Results in last 30 days:  Dg Elbow Complete Right (3+view)  Result Date: 05/09/2019 CLINICAL DATA:  Pain and swelling EXAM: RIGHT ELBOW - COMPLETE 3+ VIEW COMPARISON:  None. FINDINGS: No fracture or malalignment. Diffuse soft tissue swelling. No significant elbow effusion. IMPRESSION: Soft tissue swelling without acute osseous abnormality. Electronically Signed   By: Donavan Foil M.D.   On: 05/09/2019 01:04   Dg Forearm Right  Result Date: 05/09/2019 CLINICAL DATA:  Elbow and forearm swelling and pain EXAM: RIGHT FOREARM - 2 VIEW COMPARISON:  None. FINDINGS: No fracture or malalignment. Vascular calcifications. Diffuse soft tissue swelling without radiopaque foreign body or soft tissue emphysema. IMPRESSION: No acute osseous abnormality Electronically Signed   By: Donavan Foil M.D.   On: 05/09/2019 01:03   US Venous Img Upper Uni Right  Result Date: 05/14/2019 CLINICAL DATA:  Right upper extremity pain and edema. History of prostate cancer. Evaluate for DVT. EXAM: RIGHT UPPER EXTREMITY VENOUS DOPPLER ULTRASOUND TECHNIQUE: Gray-scale sonography with graded compression, as well as color Doppler and duplex ultrasound were performed to evaluate the upper extremity deep venous system from the level of the subclavian vein and including the jugular, axillary, basilic, radial, ulnar and upper cephalic vein. Spectral Doppler was utilized to evaluate flow at rest and with distal augmentation maneuvers. COMPARISON:  Right forearm and elbow radiographs-05/08/2019 FINDINGS: Contralateral Subclavian Vein: Respiratory phasicity is normal and symmetric with the symptomatic side. No evidence of thrombus. Normal compressibility. Internal  Jugular Vein: No evidence of thrombus. Normal compressibility, respiratory phasicity and response to augmentation. Subclavian Vein: No evidence of thrombus. Normal compressibility, respiratory phasicity and response to augmentation. Axillary Vein: No evidence of thrombus. Normal compressibility, respiratory phasicity and response to augmentation. Cephalic Vein: No  evidence of thrombus. Normal compressibility, respiratory phasicity and response to augmentation. Basilic Vein: No evidence of thrombus. Normal compressibility, respiratory phasicity and response to augmentation. Brachial Veins: No evidence of thrombus. Normal compressibility, respiratory phasicity and response to augmentation. Radial Veins: No evidence of thrombus. Normal compressibility, respiratory phasicity and response to augmentation. Ulnar Veins: No evidence of thrombus. Normal compressibility, respiratory phasicity and response to augmentation. Venous Reflux:  None visualized. Other Findings: Moderate amount of subcutaneous edema is noted involving the medial aspect of the upper arm (images 29 through 33) without definable/drainable fluid collection. IMPRESSION: 1. No evidence of DVT within the right upper extremity. 2. Nonspecific moderate amount of subcutaneous edema about the medial aspect of the upper arm without definable/drainable fluid collection. Electronically Signed   By: Sandi Mariscal M.D.   On: 05/14/2019 12:12    Assessment/Plan PEG tube Removal Balloon was deflated and in presence of his Nurse his Tube was pulled out. Patient tolerated the procedure well with no issues Pressure Bandage was placed.     Family/ staff Communication:   Labs/tests ordered:   Total time spent in this patient care encounter was  25_  minutes; greater than 50% of the visit spent counseling patient and staff, reviewing records , Labs and coordinating care for problems addressed at this encounter.

## 2019-05-31 ENCOUNTER — Encounter: Payer: Self-pay | Admitting: Adult Health

## 2019-05-31 ENCOUNTER — Non-Acute Institutional Stay (SKILLED_NURSING_FACILITY): Payer: Medicare Other | Admitting: Adult Health

## 2019-05-31 DIAGNOSIS — E1122 Type 2 diabetes mellitus with diabetic chronic kidney disease: Secondary | ICD-10-CM | POA: Diagnosis not present

## 2019-05-31 DIAGNOSIS — F323 Major depressive disorder, single episode, severe with psychotic features: Secondary | ICD-10-CM | POA: Diagnosis not present

## 2019-05-31 DIAGNOSIS — R809 Proteinuria, unspecified: Secondary | ICD-10-CM

## 2019-05-31 DIAGNOSIS — E1129 Type 2 diabetes mellitus with other diabetic kidney complication: Secondary | ICD-10-CM

## 2019-05-31 DIAGNOSIS — N183 Chronic kidney disease, stage 3 (moderate): Secondary | ICD-10-CM

## 2019-05-31 NOTE — Progress Notes (Signed)
Location:   [place] Room Number: 130 P Place of Service:  SNF (31)   CODE STATUS: Full Code  No Known Allergies  Chief Complaint  Patient presents with  . Medical Management of Chronic Issues      Microalbuminuria due to type 2 diabetes mellitus:  Major depression with psychotic features:CKD stage 3 due to type 2 diabetes mellitus    HPI:  He is a 73 year old long term resident of this facility being seen for the management of his chronic illnesses; microalbuminuria; depression; ckd stage 3.  He has had his peg tube pulled out and is tolerating po diet. There are no reports of pain present. There are no reports of agitation present. There are no reports of fevers present.   Past Medical History:  Diagnosis Date  . A-fib ([place])    a. Post-op afib after CABG 08/2012.  [name] Acute gastric ulcer with hemorrhage   . Acute respiratory failure with hypoxia ([place])   . Aphasia following cerebral infarction   . Atrial fibrillation ([place])   . CAD (coronary artery disease)    a. NSTEMI s/p stent to distal RCA 03/2000. b. Inf MI s/p emergent thrombectomy/stenting mid RCA 10/2000. c. NSTEMI s/p CABGx4 (LIMA-LAD, SVG-OM2, seq SVG-acute marginal and PD) 12/15/41 - course complicated by confusion, post-op AF, L pleural effusion with thoracentesis. d. Normal LV function by echo 08/2012.  . Diabetes mellitus   . Diverticulosis   . Dysphagia following cerebral infarction   . Encephalopathy   . Extended spectrum beta lactamase (ESBL) resistance   . Generalized anxiety disorder   . GERD (gastroesophageal reflux disease)   . Hemiplegia and hemiparesis following cerebral infarction affecting right dominant side ([place])   . Hiatal hernia   . HLD (hyperlipidemia)   . HTN (hypertension)   . Non-ST elevation (NSTEMI) myocardial infarction ([place])   . Nontraumatic intracerebral hemorrhage in hemisphere, subcortical ([place])   . Peripheral vascular disease ([place])    a. Carotid dopplers neg  08/13/12. b. Pre-cabg ABIs - R=0.87 suggesting mild dz, L=1.29 possibly falsely elevated due to calcified vessels.  . Pleural effusion    a. L pleural eff after CABG s/p thoracentesis 08/22/12.  . Prostate cancer [facility])    Status post radiation treatment.  . Prostatic hypertrophy    a. Hx of urinary retention, awaiting TURP.  [name] Severe protein-calorie malnutrition ([place])   . Tobacco abuse   . 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 Valvular heart disease    a. Mild  MR by TEE 08/2012.    Past Surgical History:  Procedure Laterality Date  . APPENDECTOMY    . BACK SURGERY    . CORONARY ARTERY BYPASS GRAFT  08/16/2012   Procedure: CORONARY ARTERY BYPASS GRAFTING (CABG);  Surgeon: Melrose Nakayama, MD;  Location: [place];  Service: Open Heart Surgery;  Laterality: N/A;  . IR REPLC GASTRO/COLONIC TUBE PERCUT W/FLUORO  02/13/2019  . LEFT HEART CATHETERIZATION WITH CORONARY ANGIOGRAM N/A 08/14/2012   Procedure: LEFT HEART CATHETERIZATION WITH CORONARY ANGIOGRAM;  Surgeon: Peter M Martinique, MD;  Location: [facility] CATH LAB;  Service: Cardiovascular;  Laterality: N/A;    Social History   Socioeconomic History  . Marital status: Married    Spouse name: Not on file  . Number of children: Not on file  . Years of education: Not on file  . Highest education level: Not on file  Occupational History  . Not on file  Social  Needs  . Financial resource strain: Not on file  . Food insecurity    Worry: Not on file    Inability: Not on file  . Transportation needs    Medical: Not on file    Non-medical: Not on file  Tobacco Use  . Smoking status: Former Smoker    Quit date: 11/12/1972    Years since quitting: 46.5  . Smokeless tobacco: Never Used  Substance and Sexual Activity  . Alcohol use: No  . Drug use: No  . Sexual activity: Never  Lifestyle  . Physical activity    Days per week: Not on file    Minutes per session: Not on file  . Stress: Not on file  Relationships  . Social  Herbalist on phone: Not on file    Gets together: Not on file    Attends religious service: Not on file    Active member of club or organization: Not on file    Attends meetings of clubs or organizations: Not on file    Relationship status: Not on file  . Intimate partner violence    Fear of current or ex partner: Not on file    Emotionally abused: Not on file    Physically abused: Not on file    Forced sexual activity: Not on file  Other Topics Concern  . Not on file  Social History Narrative  . Not on file   History reviewed. No pertinent family history.    VITAL SIGNS BP 127/71   Pulse (!) 59   Temp (!) 97.1 F (36.2 C)   Resp 19   Ht _0  (1.803 m)   Wt 166 lb 6.4 oz (75.5 kg)   BMI 23.21 kg/m   Outpatient Encounter Medications as of 05/31/2019  Medication Sig  . amLODipine (NORVASC) 5 MG tablet Take 5 mg by mouth daily.   [name] atorvastatin (LIPITOR) 40 MG tablet Take 40 mg by mouth at bedtime.   . baclofen (LIORESAL) 10 MG tablet Take 1 tablet by mouth 2 (two) times daily.   Roseanne Kaufman Peru-Castor Oil (VENELEX) OINT Apply topically to sacrum, coccyx and bilateral buttocks every shift and as needed for prevention  . diphenoxylate-atropine (LOMOTIL) 2.5-0.025 MG tablet Take 2 tablets by mouth every 4 (four) hours as needed for diarrhea or loose stools.  [name] FLUoxetine (PROZAC) 20 MG/5ML solution Take 20 mg by mouth daily.   . hydrocortisone cream (PREPARATION H) 1 % Apply rectally twice daily and as needed for hemorrhoids  . insulin aspart (NOVOLOG FLEXPEN) 100 UNIT/ML FlexPen Inject 8 Units into the skin daily. Give @ 12:00 pm  . Insulin Glargine (LANTUS SOLOSTAR) 100 UNIT/ML Solostar Pen Inject 26 Units into the skin every morning.   . lansoprazole (PREVACID SOLUTAB) 30 MG disintegrating tablet Take 30 mg by mouth 2 (two) times daily.   . Melatonin 1 MG TABS Take 2 tablets by mouth at bedtime.   . metoprolol tartrate (LOPRESSOR) 25 MG tablet Take 12.5 mg by  mouth 2 (two) times daily.   . NON FORMULARY Diet Type:   Initiate puree consistency diet with honey thick liquid  . Ostomy Supplies (SKIN PREP SPRAY) MISC Apply to bilateral heels every shift for prevention  . Pramoxine-Calamine (AVEENO ANTI-ITCH) 1-3 % LOTN Apply to the extremities and back daily and prn for itching/ dry skin. Do not apply to the buttocks wound  . Probiotic Product (RISA-BID PROBIOTIC) TABS Take 1 tablet by mouth 2 (two) times  a day.  [name] QUEtiapine (SEROQUEL) 25 MG tablet Take 12.5 mg by mouth at bedtime.   . valsartan (DIOVAN) 40 MG tablet Take 40 mg by mouth daily.   No facility-administered encounter medications on file as of 05/31/2019.      SIGNIFICANT DIAGNOSTIC EXAMS  LABS REVIEWED PREVIOUS:   01-04-19: urine culture: klebsiella oxytoca  01-16-19: wbc 10.1; hgb 13.9; hct 45.2; mcv 96.2; plt 287; glucose 371; bun 44; creat 1.10; k+ 4.7; na++137; ca 8.7 01-28-19: wbc 8.1; hgb 14.9; hct 48.4; mcv 98.;4 plt 282; glucose 285; bun 38; creat 1.17; k+ 4.3 na++ 143 ca 9.3  01-29-19: glucose 263; bun 39; creat 1.17;  k+ 4.0; na++ 139; ca 9.0; ast 52; alt 59  albumin 2.7 hgb a1c 7.6; chol 116; ldl 50; trig 236; hdl 19  03-13-19: wbc 9.3 hgb 14.4; hct 45.0; mcv 93.2; plt 270; chol 106; ldl 58 trig 124; hdl 23; urine micro-albumin 2236.0   TODAY;   05-23-19: wbc 7.0; hgb 14.6; hct 47.4; mcv 93.1; plt 290; glucose 202; bun 30; creat 1.51; k+ 4.0; na++ 140; ca 8.7; ast 50 alt 50 alk phos 131; albumin 2.6 .    Review of Systems  Reason unable to perform ROS: expressive aphasia     Physical Exam Constitutional:      General: He is not in acute distress.    Appearance: He is well-developed. He is not diaphoretic.  Neck:     Musculoskeletal: Neck supple.     Thyroid: No thyromegaly.  Cardiovascular:     Rate and Rhythm: Normal rate and regular rhythm.     Pulses: Normal pulses.     Heart sounds: Normal heart sounds.  Pulmonary:     Effort: Pulmonary effort is normal. No  respiratory distress.     Breath sounds: Normal breath sounds.  Abdominal:     General: Bowel sounds are normal. There is no distension.     Palpations: Abdomen is soft.     Tenderness: There is no abdominal tenderness.     Comments: Peg tube has been removed   Genitourinary:    Comments: Foley present  Penile tear present Musculoskeletal:     Right lower leg: No edema.     Left lower leg: No edema.     Comments: Right side hemiparesis Able to move left extremities    Lymphadenopathy:     Cervical: No cervical adenopathy.  Skin:    General: Skin is warm and dry.  Neurological:     Mental Status: He is alert. Mental status is at baseline.  Psychiatric:        Mood and Affect: Mood normal.     ASSESSMENT/ PLAN:  TODAY:   1. Microalbuminuria due to type 2 diabetes mellitus: is without change: is 2236 is on ARB will monitor   2. Major depression with psychotic features: is stable will continue prozac 20 mg daily seroquel 12.5 mg nightly (has failed wean in the past)   3. CKD stage 3 due to type 2 diabetes mellitus: is stable bun 30; creat 1.51; will monitor    PREVIOUS:   4. Dysphagia oropharyngeal phase: no signs of aspiration present: is on pureed diet with honey thick liquids will monitor   5. Chronic urine retention: is stable requires foley is followed by urology   6. Protein calorie malnutrition severe: is without change weight is stable at 166  (previous 170,  165) pounds; albumin is 2.7 will continue to monitor   7. Type  2 diabetes mellitus with peripheral vascular disease: is stable hgb a1c 7.5; will continue lantus 26 units daily with novolog 8 units at noon; is on statin and arb  8. Nontraumatic subcortical hemorrhage of left cerebral hemisphere/hemorrhagic cerebrovascular accident is stable neurologically will continue baclofen 10 mg twice daily for spasticity.   9. CAD: native artery, native heart without angina: is status post CABG 2013: is stable will  monitor is on statin.   10. Hypertension associated with diabetes: is stable b/p 127/71: will continue norvasc 5 mg daily and lopressor 12.5 mg twice daily diovan 40 mg daily   11. Dyslipidemia associated with type 2 diabetes mellitus: is stable LDL 58; will continue lipitor 40 mg daily   Will repeat cmp in 2 weeks.    MD is aware of resident's narcotic use and is in agreement with current plan of care. We will attempt to wean resident as apropriate   Ok Edwards NP [facility]  Contact [phone] Monday through Friday 8am- 5pm  After hours call [phone]

## 2019-06-04 DIAGNOSIS — E1122 Type 2 diabetes mellitus with diabetic chronic kidney disease: Secondary | ICD-10-CM | POA: Insufficient documentation

## 2019-06-04 DIAGNOSIS — E1129 Type 2 diabetes mellitus with other diabetic kidney complication: Secondary | ICD-10-CM | POA: Insufficient documentation

## 2019-06-06 ENCOUNTER — Other Ambulatory Visit (HOSPITAL_COMMUNITY)
Admission: RE | Admit: 2019-06-06 | Discharge: 2019-06-06 | Disposition: A | Discharge status: Home / Self Care | Payer: Medicare Other | Source: Skilled Nursing Facility | Attending: Adult Health | Admitting: Adult Health

## 2019-06-06 DIAGNOSIS — E43 Unspecified severe protein-calorie malnutrition: Secondary | ICD-10-CM | POA: Insufficient documentation

## 2019-06-06 LAB — COMPREHENSIVE METABOLIC PANEL
ALT: 32 U/L (ref 0–44)
AST: 24 U/L (ref 15–41)
Albumin: 2.9 g/dL — ABNORMAL LOW (ref 3.5–5.0)
Alkaline Phosphatase: 112 U/L (ref 38–126)
Anion gap: 11 (ref 5–15)
BUN: 37 mg/dL — ABNORMAL HIGH (ref 8–23)
CO2: 27 mmol/L (ref 22–32)
Calcium: 8.8 mg/dL — ABNORMAL LOW (ref 8.9–10.3)
Chloride: 103 mmol/L (ref 98–111)
Creatinine, Ser: 1.5 mg/dL — ABNORMAL HIGH (ref 0.61–1.24)
GFR calc Af Amer: 52 mL/min — ABNORMAL LOW (ref >60)
GFR calc non Af Amer: 45 mL/min — ABNORMAL LOW (ref >60)
Glucose, Bld: 85 mg/dL (ref 70–99)
Potassium: 4 mmol/L (ref 3.5–5.1)
Sodium: 141 mmol/L (ref 135–145)
Total Bilirubin: 0.8 mg/dL (ref 0.3–1.2)
Total Protein: 6.2 g/dL — ABNORMAL LOW (ref 6.5–8.1)

## 2019-06-12 ENCOUNTER — Telehealth (HOSPITAL_COMMUNITY): Payer: Self-pay | Admitting: Speech Pathology

## 2019-06-12 ENCOUNTER — Other Ambulatory Visit (HOSPITAL_COMMUNITY): Payer: Self-pay | Admitting: Specialist

## 2019-06-12 DIAGNOSIS — R1319 Other dysphagia: Secondary | ICD-10-CM

## 2019-06-12 NOTE — Telephone Encounter (Signed)
[name] called requested a return phone call once patient has been scheduled for MBSS [phone])

## 2019-06-13 ENCOUNTER — Ambulatory Visit (HOSPITAL_COMMUNITY): Payer: Medicare Other | Admitting: Speech Pathology

## 2019-06-13 ENCOUNTER — Encounter (HOSPITAL_COMMUNITY): Payer: Self-pay | Admitting: Speech Pathology

## 2019-06-13 ENCOUNTER — Inpatient Hospital Stay (HOSPITAL_COMMUNITY)
Admit: 2019-06-13 | Discharge: 2019-06-13 | Disposition: A | Discharge status: Home / Self Care | Payer: Medicare Other | Attending: Internal Medicine | Admitting: Internal Medicine

## 2019-06-13 DIAGNOSIS — R1319 Other dysphagia: Secondary | ICD-10-CM

## 2019-06-13 DIAGNOSIS — R1312 Dysphagia, oropharyngeal phase: Secondary | ICD-10-CM

## 2019-06-13 NOTE — Therapy (Signed)
[place], [place], [address] Phone: [phone]   Fax:  [phone]  Modified Barium Swallow  Patient Details  Name: [name] MRN: [id number] Date of Birth: 1946/01/22 No data recorded  Encounter Date: 06/13/2019  End of Session - 06/13/19 1509    Visit Number  1    Number of Visits  1    Authorization Type  [employer] Medicare    SLP Start Time  5625    SLP Stop Time   1342    SLP Time Calculation (min)  35 min    Activity Tolerance  Patient tolerated treatment well;Other (comment)   Pt required max cues for participation      Past Medical History:  Diagnosis Date  . A-fib ([place])    a. Post-op afib after CABG 08/2012.  [name] Acute gastric ulcer with hemorrhage   . Acute respiratory failure with hypoxia ([place])   . Aphasia following cerebral infarction   . Atrial fibrillation ([place])   . CAD (coronary artery disease)    a. NSTEMI s/p stent to distal RCA 03/2000. b. Inf MI s/p emergent thrombectomy/stenting mid RCA 10/2000. c. NSTEMI s/p CABGx4 (LIMA-LAD, SVG-OM2, seq SVG-acute marginal and PD) 05/14/88 - course complicated by confusion, post-op AF, L pleural effusion with thoracentesis. d. Normal LV function by echo 08/2012.  . Diabetes mellitus   . Diverticulosis   . Dysphagia following cerebral infarction   . Encephalopathy   . Extended spectrum beta lactamase (ESBL) resistance   . Generalized anxiety disorder   . GERD (gastroesophageal reflux disease)   . Hemiplegia and hemiparesis following cerebral infarction affecting right dominant side ([place])   . Hiatal hernia   . HLD (hyperlipidemia)   . HTN (hypertension)   . Non-ST elevation (NSTEMI) myocardial infarction ([place])   . Nontraumatic intracerebral hemorrhage in hemisphere, subcortical ([place])   . Peripheral vascular disease ([place])    a. Carotid dopplers neg 08/13/12. b. Pre-cabg ABIs - R=0.87 suggesting mild dz, L=1.29 possibly falsely elevated due to calcified vessels.   . Pleural effusion    a. L pleural eff after CABG s/p thoracentesis 08/22/12.  . Prostate cancer [facility])    Status post radiation treatment.  . Prostatic hypertrophy    a. Hx of urinary retention, awaiting TURP.  [name] Severe protein-calorie malnutrition ([place])   . Tobacco abuse   . 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 Valvular heart disease    a. Mild  MR by TEE 08/2012.    Past Surgical History:  Procedure Laterality Date  . APPENDECTOMY    . BACK SURGERY    . CORONARY ARTERY BYPASS GRAFT  08/16/2012   Procedure: CORONARY ARTERY BYPASS GRAFTING (CABG);  Surgeon: Melrose Nakayama, MD;  Location: [place];  Service: Open Heart Surgery;  Laterality: N/A;  . IR REPLC GASTRO/COLONIC TUBE PERCUT W/FLUORO  02/13/2019  . LEFT HEART CATHETERIZATION WITH CORONARY ANGIOGRAM N/A 08/14/2012   Procedure: LEFT HEART CATHETERIZATION WITH CORONARY ANGIOGRAM;  Surgeon: Peter M Martinique, MD;  Location: [facility] CATH LAB;  Service: Cardiovascular;  Laterality: N/A;    There were no vitals filed for this visit.  Subjective Assessment - 06/13/19 1449    Subjective  "Coffee."    Special Tests  MBSS    Currently in Pain?  No/denies          General - 06/13/19 1451      General Information   Date of Onset  11/18/18  HPI  [name] is a 73 yo male in SNF after staying in the [facility] hospital from 12/08-01/10 with Acute nontraumatic subcortical hemorrhage of left hemisphere. His stay was complicated by Aspiration Pneumonia requiring PEG tube placement, Urinary Retention with Foley catheter, Sacral Pressure wound, Gastric ulcer and  Restlessness. Proctitis and ESBL Bacteremia for the management of his chronic illnesses; microalbuminuria; depression; ckd stage 3.  He has had his peg tube pulled out and is tolerating po diet. There are no reports of pain present. There are no reports of agitation present. There are no reports of fevers present. Pt is is on pureed diet with honey thick liquids. He  has been referred for MBSS by Dr. Veleta Miners. His last MBSS was 01/25/19 with recommendation for NPO with po trials with SLP only. Pt pulled his PEG and has been consuming puree and NTL per treating SLP.    Type of Study  MBS-Modified Barium Swallow Study    Previous Swallow Assessment  01/25/19: NPO with po trials    Diet Prior to this Study  Dysphagia 1 (puree);Nectar-thick liquids    Temperature Spikes Noted  No    Respiratory Status  Room air    History of Recent Intubation  No    Behavior/Cognition  Alert;Impulsive;Requires cueing;Doesn't follow directions;Uncooperative    Oral Cavity Assessment  Within Functional Limits    Oral Care Completed by SLP  No    Oral Cavity - Dentition  Missing dentition    Vision  Functional for self feeding    Self-Feeding Abilities  Able to feed self    Patient Positioning  Upright in chair    Baseline Vocal Quality  Normal    Volitional Cough  Strong    Volitional Swallow  Able to elicit    Anatomy  Within functional limits    Pharyngeal Secretions  Not observed secondary MBS         Oral Preparation/Oral Phase - 06/13/19 1453      Oral Preparation/Oral Phase   Oral Phase  Impaired      Oral - Nectar   Oral - Nectar Cup  Within functional limits      Oral - Thin   Oral - Thin Cup  Oral residue    Oral - Thin Straw  Oral residue   premature spillage     Oral - Solids   Oral - Puree  Not tested   Pt refused   Oral - Regular  Piecemeal swallowing    Oral - Pill  Within functional limits      Electrical stimulation - Oral Phase   Was Electrical Stimulation Used  No       Pharyngeal Phase - 06/13/19 1456      Pharyngeal Phase   Pharyngeal Phase  Impaired      Pharyngeal - Nectar   Pharyngeal- Nectar Cup  Swallow initiation at vallecula;Pharyngeal residue - valleculae;Lateral channel residue      Pharyngeal - Thin   Pharyngeal- Thin Cup  Swallow initiation at vallecula;Pharyngeal residue - valleculae;Lateral channel  residue;Reduced airway/laryngeal closure;Penetration/Apiration after swallow    Pharyngeal  Material enters airway, remains ABOVE vocal cords then ejected out    Pharyngeal- Thin Straw  Swallow initiation at pyriform sinus;Penetration/Aspiration during swallow;Reduced airway/laryngeal closure;Lateral channel residue;Pharyngeal residue - valleculae    Pharyngeal  Material enters airway, remains ABOVE vocal cords then ejected out;Material enters airway, remains ABOVE vocal cords and not ejected out      Pharyngeal - Solids  Pharyngeal- Puree  Not tested   Pt refused   Pharyngeal- Regular  Delayed swallow initiation-vallecula    Pharyngeal- Pill  Penetration/Aspiration during swallow   aspiration of thins when taking pill, removed   Pharyngeal  Material enters airway, passes BELOW cords and not ejected out despite cough attempt by patient       Cricopharyngeal Phase - 06/13/19 1506      Cervical Esophageal Phase   Cervical Esophageal Phase  Impaired   pill remained at [facility]     Cervical Esophageal Phase - Comment   Other Esophageal Phase Observations  Barium tablet remained at [facility]        Plan - 06/13/19 1510    Clinical Impression Statement  Pt presents with improved swallow function compared to MBSS in February. Pt initially refused to participate, however with encouragement he did take cup/straw sips thin, regular textures, barium tablet with cup thin, and cup sip of nectar-thick liquid. He currently presents with mild/mod oropharyngeal dysphagia characterized by reduced tongue base retraction and reduced laryngeal vestibule closure resulting in variable trace penetration of thins during the swallow, once to the vocal folds and below but expelled (straw and when taking barium tablet with cup thin with neck extended); Pt with oral residuals which spill to valleculae and lateral channels and Pt required cues to repeat swallow to clear (waited 8+ seconds). Pt demonstrated wet vocal quality  with deeper penetration/ possible trace aspiration not observed during the study, but viewed when reviewing imaging after the study. Pt with flash penetration of nectars. Barium tablet never completely passed through GEJ during the study. Pt is at higher risk for aspiration of mixed consistencies and aspiration of all liquids after the swallow due to residuals in pharynx. Recommend D3/mech soft and thin liquids via cup sip, no straws, repeat swallows clear throat/cough intermittently; supervision with meals due to impulsivity.       Patient will benefit from skilled therapeutic intervention in order to improve the following deficits and impairments:   1. Dysphagia, oropharyngeal phase       Recommendations/Treatment - 06/13/19 1507      Swallow Evaluation Recommendations   SLP Diet Recommendations  Dysphagia 3 (mechanical soft);Thin    Liquid Administration via  Cup;No straw    Medication Administration  Whole meds with puree    Supervision  Patient able to self feed;Full supervision/cueing for compensatory strategies    Compensations  Slow rate;Small sips/bites;Clear throat intermittently;Multiple dry swallows after each bite/sip    Postural Changes  Seated upright at 90 degrees;Remain upright for at least 30 minutes after feeds/meals       Prognosis - 06/13/19 1508      Prognosis   Prognosis for Safe Diet Advancement  Fair    Barriers to Reach Goals  Cognitive deficits;Severity of deficits;Behavior    Barriers/Prognosis Comment  Pt with poor comprehension and awareness      Individuals Consulted   Consulted and Agree with Results and Recommendations  Patient;Other (Comment)    Report Sent to   Primary SLP;Facility (Comment)       Problem List Patient Active Problem List   Diagnosis Date Noted  . Microalbuminuria due to type 2 diabetes mellitus ([place]) 06/04/2019  . CKD stage 3 due to type 2 diabetes mellitus ([place]) 06/04/2019  . PEg Tube removal 05/23/2019  . Foley catheter in  place 04/25/2019  . Penis injury, sequela 04/25/2019  . Hypertension associated with diabetes ([place]) 01/28/2019  . Dyslipidemia associated with type 2 diabetes  mellitus ([place]) 01/28/2019  . Dysphagia, oropharyngeal phase 01/28/2019  . Type 2 diabetes mellitus with peripheral vascular disease ([place]) 01/28/2019  . Major depression with psychotic features ([place]) 01/28/2019  . Protein-calorie malnutrition, severe 01/08/2019  . UTI (urinary tract infection) 01/06/2019  . Pressure injury of skin 01/05/2019  . Proctitis 01/04/2019  . History of hemorrhagic cerebrovascular accident (CVA) with residual deficit 01/04/2019  . Diarrhea 01/03/2019  . Urinary retention 12/24/2018  . IVH (intraventricular hemorrhage) ([place]) 11/18/2018  . Nontraumatic subcortical hemorrhage of left cerebral hemisphere ([place]) 11/18/2018  . CAD (coronary artery disease)   . Peripheral vascular disease ([place])   . A-fib One Episode after CABG in 2013   . Prostatic hypertrophy   . IDDM (insulin dependent diabetes mellitus) ([place]) 08/15/2012  . HTN (hypertension) 08/15/2012  . Hyperlipidemia 08/15/2012   Thank you,  Genene Churn, [place]  [facility] 06/13/2019, 3:11 PM  [place] [address] [place], [place], [address] Phone: [phone]   Fax:  [phone]  Name: [name] MRN: [id number] Date of Birth: Jan 16, 1946

## 2019-06-14 ENCOUNTER — Encounter (HOSPITAL_COMMUNITY): Payer: Medicare Other | Admitting: Speech Pathology

## 2019-06-26 ENCOUNTER — Encounter: Payer: Self-pay | Admitting: Adult Health

## 2019-06-26 ENCOUNTER — Encounter (HOSPITAL_COMMUNITY): Payer: Medicare Other | Admitting: Speech Pathology

## 2019-06-26 ENCOUNTER — Non-Acute Institutional Stay (SKILLED_NURSING_FACILITY): Payer: Medicare Other | Admitting: Adult Health

## 2019-06-26 DIAGNOSIS — I152 Hypertension secondary to endocrine disorders: Secondary | ICD-10-CM

## 2019-06-26 DIAGNOSIS — E1159 Type 2 diabetes mellitus with other circulatory complications: Secondary | ICD-10-CM | POA: Diagnosis not present

## 2019-06-26 DIAGNOSIS — I10 Essential (primary) hypertension: Secondary | ICD-10-CM | POA: Diagnosis not present

## 2019-06-26 DIAGNOSIS — I610 Nontraumatic intracerebral hemorrhage in hemisphere, subcortical: Secondary | ICD-10-CM

## 2019-06-26 DIAGNOSIS — F323 Major depressive disorder, single episode, severe with psychotic features: Secondary | ICD-10-CM | POA: Diagnosis not present

## 2019-06-26 NOTE — Progress Notes (Signed)
Location:   [place] Room Number: 130 P Place of Service:  SNF (31)   CODE STATUS: Full Code  No Known Allergies  Chief Complaint  Patient presents with  . Acute Visit    Care Plan Meeting    HPI:  We have come together for his routine care plan meeting; family is present via phone. He is being seen by ST is on regular diet. He requires extensive assistance with adls. He will decline medications at times. He does roll off the bed on occasions. There are no reports of uncontrolled pain; no aspiration present.   Past Medical History:  Diagnosis Date  . A-fib ([place])    a. Post-op afib after CABG 08/2012.  [name] Acute gastric ulcer with hemorrhage   . Acute respiratory failure with hypoxia ([place])   . Aphasia following cerebral infarction   . Atrial fibrillation ([place])   . CAD (coronary artery disease)    a. NSTEMI s/p stent to distal RCA 03/2000. b. Inf MI s/p emergent thrombectomy/stenting mid RCA 10/2000. c. NSTEMI s/p CABGx4 (LIMA-LAD, SVG-OM2, seq SVG-acute marginal and PD) 5/0/53 - course complicated by confusion, post-op AF, L pleural effusion with thoracentesis. d. Normal LV function by echo 08/2012.  . Diabetes mellitus   . Diverticulosis   . Dysphagia following cerebral infarction   . Encephalopathy   . Extended spectrum beta lactamase (ESBL) resistance   . Generalized anxiety disorder   . GERD (gastroesophageal reflux disease)   . Hemiplegia and hemiparesis following cerebral infarction affecting right dominant side ([place])   . Hiatal hernia   . HLD (hyperlipidemia)   . HTN (hypertension)   . Non-ST elevation (NSTEMI) myocardial infarction ([place])   . Nontraumatic intracerebral hemorrhage in hemisphere, subcortical ([place])   . Peripheral vascular disease ([place])    a. Carotid dopplers neg 08/13/12. b. Pre-cabg ABIs - R=0.87 suggesting mild dz, L=1.29 possibly falsely elevated due to calcified vessels.  . Pleural effusion    a. L pleural eff after CABG  s/p thoracentesis 08/22/12.  . Prostate cancer [facility])    Status post radiation treatment.  . Prostatic hypertrophy    a. Hx of urinary retention, awaiting TURP.  [name] Severe protein-calorie malnutrition ([place])   . Tobacco abuse   . 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 Valvular heart disease    a. Mild  MR by TEE 08/2012.    Past Surgical History:  Procedure Laterality Date  . APPENDECTOMY    . BACK SURGERY    . CORONARY ARTERY BYPASS GRAFT  08/16/2012   Procedure: CORONARY ARTERY BYPASS GRAFTING (CABG);  Surgeon: Melrose Nakayama, MD;  Location: [place];  Service: Open Heart Surgery;  Laterality: N/A;  . IR REPLC GASTRO/COLONIC TUBE PERCUT W/FLUORO  02/13/2019  . LEFT HEART CATHETERIZATION WITH CORONARY ANGIOGRAM N/A 08/14/2012   Procedure: LEFT HEART CATHETERIZATION WITH CORONARY ANGIOGRAM;  Surgeon: Peter M Martinique, MD;  Location: [facility] CATH LAB;  Service: Cardiovascular;  Laterality: N/A;    Social History   Socioeconomic History  . Marital status: Married    Spouse name: Not on file  . Number of children: Not on file  . Years of education: Not on file  . Highest education level: Not on file  Occupational History  . Not on file  Social Needs  . Financial resource strain: Not on file  . Food insecurity    Worry: Not on file    Inability: Not on file  .  Transportation needs    Medical: Not on file    Non-medical: Not on file  Tobacco Use  . Smoking status: Former Smoker    Quit date: 11/12/1972    Years since quitting: 46.6  . Smokeless tobacco: Never Used  Substance and Sexual Activity  . Alcohol use: No  . Drug use: No  . Sexual activity: Never  Lifestyle  . Physical activity    Days per week: Not on file    Minutes per session: Not on file  . Stress: Not on file  Relationships  . Social Herbalist on phone: Not on file    Gets together: Not on file    Attends religious service: Not on file    Active member of club or organization: Not on  file    Attends meetings of clubs or organizations: Not on file    Relationship status: Not on file  . Intimate partner violence    Fear of current or ex partner: Not on file    Emotionally abused: Not on file    Physically abused: Not on file    Forced sexual activity: Not on file  Other Topics Concern  . Not on file  Social History Narrative  . Not on file   History reviewed. No pertinent family history.    VITAL SIGNS BP 114/66   Pulse (!) 59   Temp (!) 96.6 F (35.9 C)   Resp 20   Ht 5' 11"  (1.803 m)   Wt 165 lb 6.4 oz (75 kg)   BMI 23.07 kg/m   Outpatient Encounter Medications as of 06/26/2019  Medication Sig  . acetaminophen (TYLENOL) 325 MG tablet Take 650 mg by mouth every 4 (four) hours as needed.  [name] amLODipine (NORVASC) 5 MG tablet Take 5 mg by mouth daily.   [name] atorvastatin (LIPITOR) 40 MG tablet Take 40 mg by mouth at bedtime.   . baclofen (LIORESAL) 10 MG tablet Take 1 tablet by mouth 2 (two) times daily.   Roseanne Kaufman Peru-Castor Oil (VENELEX) OINT Apply topically to sacrum, coccyx and bilateral buttocks every shift and as needed for prevention  . diphenoxylate-atropine (LOMOTIL) 2.5-0.025 MG tablet Take 2 tablets by mouth every 4 (four) hours as needed for diarrhea or loose stools.  [name] FLUoxetine (PROZAC) 20 MG/5ML solution Take 20 mg by mouth daily.   . hydrocortisone cream (PREPARATION H) 1 % Apply rectally twice daily and as needed for hemorrhoids  . insulin aspart (NOVOLOG FLEXPEN) 100 UNIT/ML FlexPen Inject 8 Units into the skin daily. Give @ 12:00 pm  . Insulin Glargine (LANTUS SOLOSTAR) 100 UNIT/ML Solostar Pen Inject 26 Units into the skin every morning.   . lansoprazole (PREVACID SOLUTAB) 30 MG disintegrating tablet Take 30 mg by mouth 2 (two) times daily.   . Melatonin 1 MG TABS Take 2 tablets by mouth at bedtime.   . metoprolol tartrate (LOPRESSOR) 25 MG tablet Take 12.5 mg by mouth 2 (two) times daily.   . NON FORMULARY Diet Type:  Dysphagia 3, thin no  straws  . Ostomy Supplies (SKIN PREP SPRAY) MISC Apply to bilateral heels every shift for prevention  . Pramoxine-Calamine (AVEENO ANTI-ITCH) 1-3 % LOTN Apply to the extremities and back daily and prn for itching/ dry skin. Do not apply to the buttocks wound  . Probiotic Product (RISA-BID PROBIOTIC) TABS Take 1 tablet by mouth 2 (two) times a day.  [name] QUEtiapine (SEROQUEL) 25 MG tablet Take 12.5 mg by mouth at  bedtime.   . valsartan (DIOVAN) 40 MG tablet Take 40 mg by mouth daily.   No facility-administered encounter medications on file as of 06/26/2019.      SIGNIFICANT DIAGNOSTIC EXAMS  LABS REVIEWED PREVIOUS:   01-04-19: urine culture: klebsiella oxytoca  01-16-19: wbc 10.1; hgb 13.9; hct 45.2; mcv 96.2; plt 287; glucose 371; bun 44; creat 1.10; k+ 4.7; na++137; ca 8.7 01-28-19: wbc 8.1; hgb 14.9; hct 48.4; mcv 98.;4 plt 282; glucose 285; bun 38; creat 1.17; k+ 4.3 na++ 143 ca 9.3  01-29-19: glucose 263; bun 39; creat 1.17;  k+ 4.0; na++ 139; ca 9.0; ast 52; alt 59  albumin 2.7 hgb a1c 7.6; chol 116; ldl 50; trig 236; hdl 19  03-13-19: wbc 9.3 hgb 14.4; hct 45.0; mcv 93.2; plt 270; chol 106; ldl 58 trig 124; hdl 23; urine micro-albumin 2236.0  05-23-19: wbc 7.0; hgb 14.6; hct 47.4; mcv 93.1; plt 290; glucose 202; bun 30; creat 1.51; k+ 4.0; na++ 140; ca 8.7; ast 50 alt 50 alk phos 131; albumin 2.6 . Hgb a1c 7.5   TODAY;   06-06-19: glucose 85; bun 37; creat 1.50; k+ 4.0; na++ 141; ca 8.8; liver normal albumin 2.9    Review of Systems  Reason unable to perform ROS: expressive aphasia      Physical Exam Constitutional:      General: He is not in acute distress.    Appearance: He is well-developed. He is not diaphoretic.  Neck:     Musculoskeletal: Neck supple.     Thyroid: No thyromegaly.  Cardiovascular:     Rate and Rhythm: Normal rate and regular rhythm.     Pulses: Normal pulses.     Heart sounds: Normal heart sounds.  Pulmonary:     Effort: Pulmonary effort is normal. No  respiratory distress.     Breath sounds: Normal breath sounds.  Abdominal:     General: Bowel sounds are normal. There is no distension.     Palpations: Abdomen is soft.     Tenderness: There is no abdominal tenderness.  Genitourinary:    Comments: Foley present  Penile tear present Musculoskeletal:     Right lower leg: No edema.     Left lower leg: No edema.     Comments: Right side hemiparesis Able to move left extremities     Lymphadenopathy:     Cervical: No cervical adenopathy.  Skin:    General: Skin is warm and dry.  Neurological:     Mental Status: He is alert. Mental status is at baseline.  Psychiatric:        Mood and Affect: Mood normal.      ASSESSMENT/ PLAN:  TODAY:   1.  Nontraumatic  subcortical hemorrhage of left cerebral hemisphere  2. Hypertension associated with diabetes 3. Major depression with psychotic features  Will continue current medications Will continue therapy as directed Will continue to monitor his status.    MD is aware of resident's narcotic use and is in agreement with current plan of care. We will attempt to wean resident as apropriate   Ok Edwards NP [facility]  Contact [phone] Monday through Friday 8am- 5pm  After hours call [phone]

## 2019-06-28 ENCOUNTER — Non-Acute Institutional Stay (SKILLED_NURSING_FACILITY): Payer: Medicare Other | Admitting: Adult Health

## 2019-06-28 ENCOUNTER — Encounter: Payer: Self-pay | Admitting: Adult Health

## 2019-06-28 DIAGNOSIS — R1312 Dysphagia, oropharyngeal phase: Secondary | ICD-10-CM | POA: Diagnosis not present

## 2019-06-28 DIAGNOSIS — R339 Retention of urine, unspecified: Secondary | ICD-10-CM

## 2019-06-28 DIAGNOSIS — E1151 Type 2 diabetes mellitus with diabetic peripheral angiopathy without gangrene: Secondary | ICD-10-CM

## 2019-06-28 NOTE — Progress Notes (Signed)
Location:   [facility] Room Number: 130 P Place of Service:  SNF (31)   CODE STATUS: Full Code  No Known Allergies  Chief Complaint  Patient presents with  . Medical Management of Chronic Issues       Type 2 diabetes mellitus with peripheral vascular disease: Dysphagia oropharyngeal phase:  Chronic urine retention:     HPI:  He is a 73 year old long term resident of this facility being seen for the management of his chronic illnesses: diabetes; dysphagia; urine retention. His cbg's are elevated. There are no indications of aspiration. There are no reports of uncontrolled pain; no reports of agitation.   Past Medical History:  Diagnosis Date  . A-fib ([place])    a. Post-op afib after CABG 08/2012.  [name] Acute gastric ulcer with hemorrhage   . Acute respiratory failure with hypoxia ([place])   . Aphasia following cerebral infarction   . Atrial fibrillation ([place])   . CAD (coronary artery disease)    a. NSTEMI s/p stent to distal RCA 03/2000. b. Inf MI s/p emergent thrombectomy/stenting mid RCA 10/2000. c. NSTEMI s/p CABGx4 (LIMA-LAD, SVG-OM2, seq SVG-acute marginal and PD) 12/20/60 - course complicated by confusion, post-op AF, L pleural effusion with thoracentesis. d. Normal LV function by echo 08/2012.  . Diabetes mellitus   . Diverticulosis   . Dysphagia following cerebral infarction   . Encephalopathy   . Extended spectrum beta lactamase (ESBL) resistance   . Generalized anxiety disorder   . GERD (gastroesophageal reflux disease)   . Hemiplegia and hemiparesis following cerebral infarction affecting right dominant side ([place])   . Hiatal hernia   . HLD (hyperlipidemia)   . HTN (hypertension)   . Non-ST elevation (NSTEMI) myocardial infarction ([place])   . Nontraumatic intracerebral hemorrhage in hemisphere, subcortical ([place])   . Peripheral vascular disease ([place])    a. Carotid dopplers neg 08/13/12. b. Pre-cabg ABIs - R=0.87 suggesting mild dz, L=1.29 possibly  falsely elevated due to calcified vessels.  . Pleural effusion    a. L pleural eff after CABG s/p thoracentesis 08/22/12.  . Prostate cancer [facility])    Status post radiation treatment.  . Prostatic hypertrophy    a. Hx of urinary retention, awaiting TURP.  [name] Severe protein-calorie malnutrition ([place])   . Tobacco abuse   . 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 Valvular heart disease    a. Mild  MR by TEE 08/2012.    Past Surgical History:  Procedure Laterality Date  . APPENDECTOMY    . BACK SURGERY    . CORONARY ARTERY BYPASS GRAFT  08/16/2012   Procedure: CORONARY ARTERY BYPASS GRAFTING (CABG);  Surgeon: Melrose Nakayama, MD;  Location: [facility];  Service: Open Heart Surgery;  Laterality: N/A;  . IR REPLC GASTRO/COLONIC TUBE PERCUT W/FLUORO  02/13/2019  . LEFT HEART CATHETERIZATION WITH CORONARY ANGIOGRAM N/A 08/14/2012   Procedure: LEFT HEART CATHETERIZATION WITH CORONARY ANGIOGRAM;  Surgeon: Peter M Martinique, MD;  Location: [facility] CATH LAB;  Service: Cardiovascular;  Laterality: N/A;    Social History   Socioeconomic History  . Marital status: Married    Spouse name: Not on file  . Number of children: Not on file  . Years of education: Not on file  . Highest education level: Not on file  Occupational History  . Not on file  Social Needs  . Financial resource strain: Not hard at all  . Food insecurity    Worry: Patient  refused    Inability: Patient refused  . Transportation needs    Medical: Patient refused    Non-medical: Patient refused  Tobacco Use  . Smoking status: Former Smoker    Quit date: 11/12/1972    Years since quitting: 46.6  . Smokeless tobacco: Never Used  Substance and Sexual Activity  . Alcohol use: No  . Drug use: No  . Sexual activity: Not Currently  Lifestyle  . Physical activity    Days per week: Patient refused    Minutes per session: Patient refused  . Stress: Patient refused  Relationships  . Social Herbalist on phone:  Patient refused    Gets together: Patient refused    Attends religious service: Patient refused    Active member of club or organization: Patient refused    Attends meetings of clubs or organizations: Patient refused    Relationship status: Patient refused  . Intimate partner violence    Fear of current or ex partner: Patient refused    Emotionally abused: Patient refused    Physically abused: Patient refused    Forced sexual activity: Patient refused  Other Topics Concern  . Not on file  Social History Narrative   He is a long term patient of [facility]    Family History  Problem Relation Age of Onset  . Hypertension Mother       VITAL SIGNS BP 114/66   Pulse (!) 59   Temp 97.6 F (36.4 C)   Resp 20   Ht 5' 11"  (1.803 m)   Wt 165 lb 6.4 oz (75 kg)   BMI 23.07 kg/m   Outpatient Encounter Medications as of 06/28/2019  Medication Sig  . acetaminophen (TYLENOL) 325 MG tablet Take 650 mg by mouth every 4 (four) hours as needed.  [name] amLODipine (NORVASC) 5 MG tablet Take 5 mg by mouth daily.   [name] atorvastatin (LIPITOR) 40 MG tablet Take 40 mg by mouth at bedtime.   . baclofen (LIORESAL) 10 MG tablet Take 1 tablet by mouth 2 (two) times daily.   Roseanne Kaufman Peru-Castor Oil (VENELEX) OINT Apply topically to sacrum, coccyx and bilateral buttocks every shift and as needed for prevention  . diphenoxylate-atropine (LOMOTIL) 2.5-0.025 MG tablet Take 2 tablets by mouth every 4 (four) hours as needed for diarrhea or loose stools.  [name] FLUoxetine (PROZAC) 20 MG/5ML solution Take 20 mg by mouth daily.   . hydrocortisone cream (PREPARATION H) 1 % Apply rectally twice daily and as needed for hemorrhoids  . insulin aspart (NOVOLOG FLEXPEN) 100 UNIT/ML FlexPen Inject 8 Units into the skin daily. Give @ 12:00 pm  . Insulin Glargine (LANTUS SOLOSTAR) 100 UNIT/ML Solostar Pen Inject 26 Units into the skin every morning.   . lansoprazole (PREVACID SOLUTAB) 30 MG disintegrating tablet Take 30 mg by mouth 2  (two) times daily.   . Melatonin 1 MG TABS Take 2 tablets by mouth at bedtime.   . metoprolol tartrate (LOPRESSOR) 25 MG tablet Take 12.5 mg by mouth 2 (two) times daily.   . NON FORMULARY Diet Type:  Dysphagia 3, thin no straws  . Ostomy Supplies (SKIN PREP SPRAY) MISC Apply to bilateral heels every shift for prevention  . Pramoxine-Calamine (AVEENO ANTI-ITCH) 1-3 % LOTN Apply to the extremities and back daily and prn for itching/ dry skin. Do not apply to the buttocks wound  . Probiotic Product (RISA-BID PROBIOTIC) TABS Take 1 tablet by mouth 2 (two) times a day.  [name] QUEtiapine (SEROQUEL)  25 MG tablet Take 12.5 mg by mouth at bedtime.   . valsartan (DIOVAN) 40 MG tablet Take 40 mg by mouth daily.   No facility-administered encounter medications on file as of 06/28/2019.      SIGNIFICANT DIAGNOSTIC EXAMS  LABS REVIEWED PREVIOUS:   01-04-19: urine culture: klebsiella oxytoca  01-16-19: wbc 10.1; hgb 13.9; hct 45.2; mcv 96.2; plt 287; glucose 371; bun 44; creat 1.10; k+ 4.7; na++137; ca 8.7 01-28-19: wbc 8.1; hgb 14.9; hct 48.4; mcv 98.;4 plt 282; glucose 285; bun 38; creat 1.17; k+ 4.3 na++ 143 ca 9.3  01-29-19: glucose 263; bun 39; creat 1.17;  k+ 4.0; na++ 139; ca 9.0; ast 52; alt 59  albumin 2.7 hgb a1c 7.6; chol 116; ldl 50; trig 236; hdl 19  03-13-19: wbc 9.3 hgb 14.4; hct 45.0; mcv 93.2; plt 270; chol 106; ldl 58 trig 124; hdl 23; urine micro-albumin 2236.0  05-23-19: wbc 7.0; hgb 14.6; hct 47.4; mcv 93.1; plt 290; glucose 202; bun 30; creat 1.51; k+ 4.0; na++ 140; ca 8.7; ast 50 alt 50 alk phos 131; albumin 2.6 . Hgb a1c 7.5  06-06-19: glucose 85; bun 37; creat 1.50; k+ 4.0; na++ 141; ca 8.8; liver normal albumin 2.9   NO NEW LABS.   Review of Systems  Reason unable to perform ROS: expressive aphasia      Physical Exam Constitutional:      General: He is not in acute distress.    Appearance: He is well-developed. He is not diaphoretic.  Neck:     Musculoskeletal: Neck supple.      Thyroid: No thyromegaly.  Cardiovascular:     Rate and Rhythm: Normal rate and regular rhythm.     Pulses: Normal pulses.     Heart sounds: Normal heart sounds.  Pulmonary:     Effort: Pulmonary effort is normal. No respiratory distress.     Breath sounds: Normal breath sounds.  Abdominal:     General: Bowel sounds are normal. There is no distension.     Palpations: Abdomen is soft.     Tenderness: There is no abdominal tenderness.  Genitourinary:    Comments: Foley present  Penile tear present Musculoskeletal:     Right lower leg: No edema.     Left lower leg: No edema.     Comments: Right side hemiparesis Able to move left extremities      Lymphadenopathy:     Cervical: No cervical adenopathy.  Skin:    General: Skin is warm and dry.  Neurological:     General: No focal deficit present.     Mental Status: He is alert.  Psychiatric:        Mood and Affect: Mood normal.         ASSESSMENT/ PLAN:  TODAY:   1. Type 2 diabetes mellitus with peripheral vascular disease: cbg's are elevated hgb a1c 7.5 will continue lantus 26 units nightly and will change to novolog 8 units with lunch and supper.   2. Dysphagia oropharyngeal phase: is stable no signs of aspiration is now on thin liquids will monitor   3. Chronic urine retention: without change requires long term foley is followed by urology.   PREVIOUS:   4. Protein calorie malnutrition severe: is without change weight is stable at 165  (previous 166, 170,  165) pounds; albumin is 2.9 will continue to monitor   5. Nontraumatic subcortical hemorrhage of left cerebral hemisphere/hemorrhagic cerebrovascular accident is stable neurologically will continue baclofen 10 mg twice  daily for spasticity.   6. CAD: native artery, native heart without angina: is status post CABG 2013: is stable will monitor is on statin.   7. Hypertension associated with diabetes: is stable b/p 127/71: will continue norvasc 5 mg daily and lopressor  12.5 mg twice daily diovan 40 mg daily   8. Dyslipidemia associated with type 2 diabetes mellitus: is stable LDL 58; will continue lipitor 40 mg daily   9. Microalbuminuria due to type 2 diabetes mellitus: is without change: is 2236 is on ARB will monitor   10. Major depression with psychotic features: is stable will continue prozac 20 mg daily seroquel 12.5 mg nightly (has failed wean in the past)   11. CKD stage 3 due to type 2 diabetes mellitus: is stable bun 37; creat 1.50; will monitor    MD is aware of resident's narcotic use and is in agreement with current plan of care. We will attempt to wean resident as apropriate   Ok Edwards NP [facility]  Contact [phone] Monday through Friday 8am- 5pm  After hours call [phone]

## 2019-07-01 ENCOUNTER — Encounter: Payer: Self-pay | Admitting: Adult Health

## 2019-07-15 ENCOUNTER — Encounter: Payer: Self-pay | Admitting: Adult Health

## 2019-07-15 NOTE — Progress Notes (Signed)
Entered in error

## 2019-07-16 ENCOUNTER — Ambulatory Visit (INDEPENDENT_AMBULATORY_CARE_PROVIDER_SITE_OTHER): Payer: Medicare Other | Admitting: Urology

## 2019-07-16 DIAGNOSIS — N401 Enlarged prostate with lower urinary tract symptoms: Secondary | ICD-10-CM | POA: Diagnosis not present

## 2019-07-24 ENCOUNTER — Non-Acute Institutional Stay (SKILLED_NURSING_FACILITY): Payer: Medicare Other | Admitting: Internal Medicine

## 2019-07-24 DIAGNOSIS — E1159 Type 2 diabetes mellitus with other circulatory complications: Secondary | ICD-10-CM

## 2019-07-24 DIAGNOSIS — I2510 Atherosclerotic heart disease of native coronary artery without angina pectoris: Secondary | ICD-10-CM | POA: Diagnosis not present

## 2019-07-24 DIAGNOSIS — I10 Essential (primary) hypertension: Secondary | ICD-10-CM | POA: Diagnosis not present

## 2019-07-24 DIAGNOSIS — R809 Proteinuria, unspecified: Secondary | ICD-10-CM

## 2019-07-24 DIAGNOSIS — I152 Hypertension secondary to endocrine disorders: Secondary | ICD-10-CM

## 2019-07-24 DIAGNOSIS — E1129 Type 2 diabetes mellitus with other diabetic kidney complication: Secondary | ICD-10-CM | POA: Diagnosis not present

## 2019-07-24 NOTE — Progress Notes (Signed)
Location:  [place] [facility] Room Number: 130/P Place of Service:  SNF (31)  Default, Provider, MD  Patient Care Team: Default, Provider, MD as PCP - General [name], MD as Consulting Physician (Urology)  Extended Emergency Contact Information Primary Emergency Contact: Gubbels,[name] Address: [address]          [place] [address] [name] of [place] Phone: [phone] Mobile Phone: [phone] Relation: Spouse Secondary Emergency Contact: Ernest,Travis  [place] Phone: [phone] Relation: Son    Allergies: Patient has no known allergies.  Chief Complaint  Patient presents with  . Medical Management of Chronic Issues    Routine visit of medical management    HPI: Patient is 73 y.o. male who is being seen for routine issues of hypertension, coronary artery disease, and microalbuminuria due to diabetes mellitus.  Past Medical History:  Diagnosis Date  . A-fib ([place])    a. Post-op afib after CABG 08/2012.  [name] Acute gastric ulcer with hemorrhage   . Acute respiratory failure with hypoxia ([place])   . Aphasia following cerebral infarction   . Atrial fibrillation ([place])   . CAD (coronary artery disease)    a. NSTEMI s/p stent to distal RCA 03/2000. b. Inf MI s/p emergent thrombectomy/stenting mid RCA 10/2000. c. NSTEMI s/p CABGx4 (LIMA-LAD, SVG-OM2, seq SVG-acute marginal and PD) 0/2/54 - course complicated by confusion, post-op AF, L pleural effusion with thoracentesis. d. Normal LV function by echo 08/2012.  . Diabetes mellitus   . Diverticulosis   . Dysphagia following cerebral infarction   . Encephalopathy   . Extended spectrum beta lactamase (ESBL) resistance   . Generalized anxiety disorder   . GERD (gastroesophageal reflux disease)   . Hemiplegia and hemiparesis following cerebral infarction affecting right dominant side ([place])   . Hiatal hernia   . HLD (hyperlipidemia)   . HTN (hypertension)   . Non-ST  elevation (NSTEMI) myocardial infarction ([place])   . Nontraumatic intracerebral hemorrhage in hemisphere, subcortical ([place])   . Peripheral vascular disease ([place])    a. Carotid dopplers neg 08/13/12. b. Pre-cabg ABIs - R=0.87 suggesting mild dz, L=1.29 possibly falsely elevated due to calcified vessels.  . Pleural effusion    a. L pleural eff after CABG s/p thoracentesis 08/22/12.  . Prostate cancer [facility])    Status post radiation treatment.  . Prostatic hypertrophy    a. Hx of urinary retention, awaiting TURP.  [name] Severe protein-calorie malnutrition ([place])   . Tobacco abuse   . 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 Valvular heart disease    a. Mild  MR by TEE 08/2012.    Past Surgical History:  Procedure Laterality Date  . APPENDECTOMY    . BACK SURGERY    . CORONARY ARTERY BYPASS GRAFT  08/16/2012   Procedure: CORONARY ARTERY BYPASS GRAFTING (CABG);  Surgeon: Melrose Nakayama, MD;  Location: [place];  Service: Open Heart Surgery;  Laterality: N/A;  . IR REPLC GASTRO/COLONIC TUBE PERCUT W/FLUORO  02/13/2019  . LEFT HEART CATHETERIZATION WITH CORONARY ANGIOGRAM N/A 08/14/2012   Procedure: LEFT HEART CATHETERIZATION WITH CORONARY ANGIOGRAM;  Surgeon: Peter M Martinique, MD;  Location: [facility] CATH LAB;  Service: Cardiovascular;  Laterality: N/A;    Outpatient Encounter Medications as of 07/24/2019  Medication Sig  . acetaminophen (TYLENOL) 325 MG tablet Take 650 mg by mouth every 4 (four) hours as needed.  [name] amLODipine (NORVASC) 5 MG tablet Take 5 mg by mouth daily.   [name]  atorvastatin (LIPITOR) 40 MG tablet Take 40 mg by mouth at bedtime.   . baclofen (LIORESAL) 10 MG tablet Take 1 tablet by mouth 2 (two) times daily.   Roseanne Kaufman [place]-Castor Oil (VENELEX) OINT Apply topically to sacrum, coccyx and bilateral buttocks every shift and as needed for prevention  . diphenoxylate-atropine (LOMOTIL) 2.5-0.025 MG tablet Take 2 tablets by mouth every 4 (four) hours as needed for diarrhea or loose  stools.  [name] FLUoxetine (PROZAC) 20 MG/5ML solution Take 20 mg by mouth daily.   . hydrocortisone cream (PREPARATION H) 1 % Apply rectally twice daily and as needed for hemorrhoids  . insulin aspart (NOVOLOG FLEXPEN) 100 UNIT/ML FlexPen Inject 8 Units into the skin 2 (two) times daily. Give @ 12:00 pm and 5:00 pm  . Insulin Glargine (LANTUS SOLOSTAR) 100 UNIT/ML Solostar Pen Inject 26 Units into the skin every morning.   . lansoprazole (PREVACID SOLUTAB) 30 MG disintegrating tablet Take 30 mg by mouth 2 (two) times daily.   . Melatonin 1 MG TABS Take 2 tablets by mouth at bedtime.   . metoprolol tartrate (LOPRESSOR) 25 MG tablet Take 12.5 mg by mouth 2 (two) times daily.   . NON FORMULARY Diet Type:  Dysphagia 3, thin no straws  . Ostomy Supplies (SKIN PREP SPRAY) MISC Apply to bilateral heels every shift for prevention  . Pramoxine-Calamine (AVEENO ANTI-ITCH) 1-3 % LOTN Apply to the extremities and back daily and prn for itching/ dry skin. Do not apply to the buttocks wound  . Probiotic Product (RISA-BID PROBIOTIC) TABS Take 1 tablet by mouth 2 (two) times a day.  [name] QUEtiapine (SEROQUEL) 25 MG tablet Take 12.5 mg by mouth at bedtime.   . valsartan (DIOVAN) 40 MG tablet Take 40 mg by mouth daily.   No facility-administered encounter medications on file as of 07/24/2019.      No orders of the defined types were placed in this encounter.    There is no immunization history on file for this patient.  Social History   Tobacco Use  . Smoking status: Former Smoker    Quit date: 11/12/1972    Years since quitting: 46.7  . Smokeless tobacco: Never Used  Substance Use Topics  . Alcohol use: No    Review of Systems    unable to obtain secondary to expressive aphasia; nursing-no acute concerns    Vitals:   07/24/19 1141  BP: 91/78  Pulse: 60  Resp: 20  Temp: (!) 97 F (36.1 C)  SpO2: 98%   Body mass index is 22.82 kg/m. Physical Exam  GENERAL APPEARANCE: Alert, non-conversant,  No acute distress  SKIN: No diaphoresis rash HEENT: Unremarkable RESPIRATORY: Breathing is even, unlabored. Lung sounds are clear   CARDIOVASCULAR: Heart RRR no murmurs, rubs or gallops. No peripheral edema  GASTROINTESTINAL: Abdomen is soft, non-tender, not distended w/ normal bowel sounds.  GENITOURINARY: Bladder non tender, not distended  MUSCULOSKELETAL: No abnormal joints or musculature NEUROLOGIC: Cranial nerves 2-12 grossly intact, expressive aphasia; right hemiparesis PSYCHIATRIC:  affect appropriate to situation, no behavioral issues  Patient Active Problem List   Diagnosis Date Noted  . Microalbuminuria due to type 2 diabetes mellitus ([place]) 06/04/2019  . CKD stage 3 due to type 2 diabetes mellitus ([place]) 06/04/2019  . PEg Tube removal 05/23/2019  . Foley catheter in place 04/25/2019  . Penis injury, sequela 04/25/2019  . Hypertension associated with diabetes ([place]) 01/28/2019  . Dyslipidemia associated with type 2 diabetes mellitus ([place]) 01/28/2019  . Dysphagia, oropharyngeal  phase 01/28/2019  . Type 2 diabetes mellitus with peripheral vascular disease ([place]) 01/28/2019  . Major depression with psychotic features ([place]) 01/28/2019  . Protein-calorie malnutrition, severe 01/08/2019  . UTI (urinary tract infection) 01/06/2019  . Pressure injury of skin 01/05/2019  . Proctitis 01/04/2019  . History of hemorrhagic cerebrovascular accident (CVA) with residual deficit 01/04/2019  . Diarrhea 01/03/2019  . Urinary retention 12/24/2018  . IVH (intraventricular hemorrhage) ([place]) 11/18/2018  . Nontraumatic subcortical hemorrhage of left cerebral hemisphere ([place]) 11/18/2018  . CAD (coronary artery disease)   . Peripheral vascular disease ([place])   . A-fib One Episode after CABG in 2013   . Prostatic hypertrophy   . IDDM (insulin dependent diabetes mellitus) ([place]) 08/15/2012  . HTN (hypertension) 08/15/2012  . Hyperlipidemia 08/15/2012    CMP     Component Value Date/Time   NA 141  06/06/2019 0445   K 4.0 06/06/2019 0445   CL 103 06/06/2019 0445   CO2 27 06/06/2019 0445   GLUCOSE 85 06/06/2019 0445   BUN 37 (H) 06/06/2019 0445   CREATININE 1.50 (H) 06/06/2019 0445   CALCIUM 8.8 (L) 06/06/2019 0445   PROT 6.2 (L) 06/06/2019 0445   ALBUMIN 2.9 (L) 06/06/2019 0445   AST 24 06/06/2019 0445   ALT 32 06/06/2019 0445   ALKPHOS 112 06/06/2019 0445   BILITOT 0.8 06/06/2019 0445   GFRNONAA 45 (L) 06/06/2019 0445   GFRAA 52 (L) 06/06/2019 0445   Recent Labs    01/29/19 0700 05/23/19 1037 06/06/19 0445  NA 139 140 141  K 4.0 4.0 4.0  CL 101 104 103  CO2 30 28 27   GLUCOSE 263* 202* 85  BUN 39* 30* 37*  CREATININE 1.17 1.51* 1.50*  CALCIUM 9.0 8.7* 8.8*   Recent Labs    01/29/19 0700 05/23/19 1037 06/06/19 0445  AST 52* 50* 24  ALT 59* 49* 32  ALKPHOS 121 131* 112  BILITOT 0.6 1.0 0.8  PROT 6.0* 6.1* 6.2*  ALBUMIN 2.7* 2.6* 2.9*   Recent Labs    12/24/18 0850  01/05/19 0715  01/16/19 0700 01/28/19 0730 03/13/19 0700 05/23/19 1037  WBC 7.4   < > 6.6   < > 10.1 8.1 9.3 7.0  NEUTROABS 5.0  --  5.1  --  7.4  --   --   --   HGB 13.7   < > 13.8   < > 13.9 14.9 14.4 14.6  HCT 43.7   < > 44.3   < > 45.2 48.4 45.0 47.4  MCV 94.6   < > 95.7   < > 96.2 98.4 93.2 93.1  PLT 271   < > 291   < > 287 282 270 290   < > = values in this interval not displayed.   Recent Labs    01/29/19 0700 03/13/19 0700  CHOL 116 106  LDLCALC 50 58  TRIG 236* 124   Lab Results  Component Value Date   MICROALBUR 2,236.0 (H) 03/13/2019   No results found for: TSH Lab Results  Component Value Date   HGBA1C 7.5 (H) 05/23/2019   Lab Results  Component Value Date   CHOL 106 03/13/2019   HDL 23 (L) 03/13/2019   LDLCALC 58 03/13/2019   TRIG 124 03/13/2019   CHOLHDL 4.6 03/13/2019    Significant Diagnostic Results in last 30 days:  No results found.  Assessment and Plan  Hypertension associated with diabetes ([place]) Controlled; continue Norvasc 5 mg daily,  metoprolol 12.5 mg  twice daily and valsartan 40 mg daily  CAD (coronary artery disease) No reports of chest pain or equivalent; continue metoprolol 12.5 mg twice daily; patient is on statin  Microalbuminuria due to type 2 diabetes mellitus ([place]) Stable; patient is on ARB     Hennie Duos, MD

## 2019-07-28 ENCOUNTER — Encounter: Payer: Self-pay | Admitting: Internal Medicine

## 2019-07-28 NOTE — Assessment & Plan Note (Signed)
Stable; patient is on ARB

## 2019-07-28 NOTE — Assessment & Plan Note (Signed)
No reports of chest pain or equivalent; continue metoprolol 12.5 mg twice daily; patient is on statin

## 2019-07-28 NOTE — Assessment & Plan Note (Signed)
Controlled; continue Norvasc 5 mg daily, metoprolol 12.5 mg twice daily and valsartan 40 mg daily

## 2019-08-15 ENCOUNTER — Non-Acute Institutional Stay (SKILLED_NURSING_FACILITY): Payer: Medicare Other | Admitting: Adult Health

## 2019-08-15 ENCOUNTER — Encounter: Payer: Self-pay | Admitting: Adult Health

## 2019-08-15 DIAGNOSIS — F323 Major depressive disorder, single episode, severe with psychotic features: Secondary | ICD-10-CM | POA: Diagnosis not present

## 2019-08-15 NOTE — Progress Notes (Signed)
Location:    [facility] Room Number: 130/P Place of Service:  SNF (31)   CODE STATUS: DNR  No Known Allergies  Chief Complaint  Patient presents with  . Acute Visit    Psycho Active Medication Review,     HPI:  We have come together for his psychoactive medication review. He is presently taking prozac 20 mg daily and seroquel 12.5 mg nightly. He no longer has a peg tube. He does not engage with others; he is spending all of his time in his bed. There are no reports of changes in his appetite. He has experienced some falls without injuries.    Past Medical History:  Diagnosis Date  . A-fib ([place])    a. Post-op afib after CABG 08/2012.  [name] Acute gastric ulcer with hemorrhage   . Acute respiratory failure with hypoxia ([place])   . Aphasia following cerebral infarction   . Atrial fibrillation ([place])   . CAD (coronary artery disease)    a. NSTEMI s/p stent to distal RCA 03/2000. b. Inf MI s/p emergent thrombectomy/stenting mid RCA 10/2000. c. NSTEMI s/p CABGx4 (LIMA-LAD, SVG-OM2, seq SVG-acute marginal and PD) 06/14/24 - course complicated by confusion, post-op AF, L pleural effusion with thoracentesis. d. Normal LV function by echo 08/2012.  . Diabetes mellitus   . Diverticulosis   . Dysphagia following cerebral infarction   . Encephalopathy   . Extended spectrum beta lactamase (ESBL) resistance   . Generalized anxiety disorder   . GERD (gastroesophageal reflux disease)   . Hemiplegia and hemiparesis following cerebral infarction affecting right dominant side ([place])   . Hiatal hernia   . HLD (hyperlipidemia)   . HTN (hypertension)   . Non-ST elevation (NSTEMI) myocardial infarction ([place])   . Nontraumatic intracerebral hemorrhage in hemisphere, subcortical ([place])   . Peripheral vascular disease ([place])    a. Carotid dopplers neg 08/13/12. b. Pre-cabg ABIs - R=0.87 suggesting mild dz, L=1.29 possibly falsely elevated due to calcified vessels.  . Pleural effusion    a. L  pleural eff after CABG s/p thoracentesis 08/22/12.  . Prostate cancer [facility])    Status post radiation treatment.  . Prostatic hypertrophy    a. Hx of urinary retention, awaiting TURP.  [name] Severe protein-calorie malnutrition ([place])   . Tobacco abuse   . 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 Valvular heart disease    a. Mild  MR by TEE 08/2012.    Past Surgical History:  Procedure Laterality Date  . APPENDECTOMY    . BACK SURGERY    . CORONARY ARTERY BYPASS GRAFT  08/16/2012   Procedure: CORONARY ARTERY BYPASS GRAFTING (CABG);  Surgeon: Melrose Nakayama, MD;  Location: [facility];  Service: Open Heart Surgery;  Laterality: N/A;  . IR REPLC GASTRO/COLONIC TUBE PERCUT W/FLUORO  02/13/2019  . LEFT HEART CATHETERIZATION WITH CORONARY ANGIOGRAM N/A 08/14/2012   Procedure: LEFT HEART CATHETERIZATION WITH CORONARY ANGIOGRAM;  Surgeon: Peter M Martinique, MD;  Location: [facility] CATH LAB;  Service: Cardiovascular;  Laterality: N/A;    Social History   Socioeconomic History  . Marital status: Married    Spouse name: Not on file  . Number of children: Not on file  . Years of education: Not on file  . Highest education level: Not on file  Occupational History  . Not on file  Social Needs  . Financial resource strain: Not hard at all  . Food insecurity    Worry: Patient refused  Inability: Patient refused  . Transportation needs    Medical: Patient refused    Non-medical: Patient refused  Tobacco Use  . Smoking status: Former Smoker    Quit date: 11/12/1972    Years since quitting: 46.7  . Smokeless tobacco: Never Used  Substance and Sexual Activity  . Alcohol use: No  . Drug use: No  . Sexual activity: Not Currently  Lifestyle  . Physical activity    Days per week: Patient refused    Minutes per session: Patient refused  . Stress: Patient refused  Relationships  . Social Herbalist on phone: Patient refused    Gets together: Patient refused    Attends religious  service: Patient refused    Active member of club or organization: Patient refused    Attends meetings of clubs or organizations: Patient refused    Relationship status: Patient refused  . Intimate partner violence    Fear of current or ex partner: Patient refused    Emotionally abused: Patient refused    Physically abused: Patient refused    Forced sexual activity: Patient refused  Other Topics Concern  . Not on file  Social History Narrative   He is a long term patient of [facility]    Family History  Problem Relation Age of Onset  . Hypertension Mother       VITAL SIGNS BP 129/77   Pulse 67   Temp 98.3 F (36.8 C) (Oral)   Resp 20   Ht _0  (1.803 m)   Wt 163 lb 9.6 oz (74.2 kg)   SpO2 98%   BMI 22.82 kg/m   Outpatient Encounter Medications as of 08/15/2019  Medication Sig  . acetaminophen (TYLENOL) 325 MG tablet Take 650 mg by mouth every 4 (four) hours as needed.  [name] amLODipine (NORVASC) 5 MG tablet Take 5 mg by mouth daily.   [name] atorvastatin (LIPITOR) 40 MG tablet Take 40 mg by mouth at bedtime.   . baclofen (LIORESAL) 10 MG tablet Take 1 tablet by mouth 2 (two) times daily.   Roseanne Kaufman Peru-Castor Oil (VENELEX) OINT Apply topically to sacrum, coccyx and bilateral buttocks every shift and as needed for prevention  . diphenoxylate-atropine (LOMOTIL) 2.5-0.025 MG tablet Take 2 tablets by mouth every 4 (four) hours as needed for diarrhea or loose stools.  [name] FLUoxetine (PROZAC) 20 MG/5ML solution Take 20 mg by mouth daily.   . hydrocortisone cream (PREPARATION H) 1 % Apply rectally twice daily and as needed for hemorrhoids  . insulin aspart (NOVOLOG FLEXPEN) 100 UNIT/ML FlexPen Inject 8 Units into the skin 2 (two) times daily. Give @ 12:00 pm and 5:00 pm  . Insulin Glargine (LANTUS SOLOSTAR) 100 UNIT/ML Solostar Pen Inject 26 Units into the skin every morning.   . lansoprazole (PREVACID SOLUTAB) 30 MG disintegrating tablet Take 30 mg by mouth 2 (two) times daily.   . Melatonin 1  MG TABS Take 2 tablets by mouth at bedtime.   . metoprolol tartrate (LOPRESSOR) 25 MG tablet Take 12.5 mg by mouth 2 (two) times daily.   . NON FORMULARY Diet Type:  Dysphagia 3, thin no straws  . Ostomy Supplies (SKIN PREP SPRAY) MISC Apply to bilateral heels every shift for prevention  . Pramoxine-Calamine (AVEENO ANTI-ITCH) 1-3 % LOTN Apply to the extremities and back daily and prn for itching/ dry skin. Do not apply to the buttocks wound  . Probiotic Product (RISA-BID PROBIOTIC) TABS Take 1 tablet by mouth 2 (two) times  a day.  [name] QUEtiapine (SEROQUEL) 25 MG tablet Take 12.5 mg by mouth at bedtime.   . valsartan (DIOVAN) 40 MG tablet Take 40 mg by mouth daily.   No facility-administered encounter medications on file as of 08/15/2019.      SIGNIFICANT DIAGNOSTIC EXAMS    LABS REVIEWED PREVIOUS:   01-04-19: urine culture: klebsiella oxytoca  01-16-19: wbc 10.1; hgb 13.9; hct 45.2; mcv 96.2; plt 287; glucose 371; bun 44; creat 1.10; k+ 4.7; na++137; ca 8.7 01-28-19: wbc 8.1; hgb 14.9; hct 48.4; mcv 98.;4 plt 282; glucose 285; bun 38; creat 1.17; k+ 4.3 na++ 143 ca 9.3  01-29-19: glucose 263; bun 39; creat 1.17;  k+ 4.0; na++ 139; ca 9.0; ast 52; alt 59  albumin 2.7 hgb a1c 7.6; chol 116; ldl 50; trig 236; hdl 19  03-13-19: wbc 9.3 hgb 14.4; hct 45.0; mcv 93.2; plt 270; chol 106; ldl 58 trig 124; hdl 23; urine micro-albumin 2236.0  05-23-19: wbc 7.0; hgb 14.6; hct 47.4; mcv 93.1; plt 290; glucose 202; bun 30; creat 1.51; k+ 4.0; na++ 140; ca 8.7; ast 50 alt 50 alk phos 131; albumin 2.6 . Hgb a1c 7.5  06-06-19: glucose 85; bun 37; creat 1.50; k+ 4.0; na++ 141; ca 8.8; liver normal albumin 2.9   NO NEW LABS.    Review of Systems  Reason unable to perform ROS: expressive aphasia      Physical Exam Constitutional:      General: He is not in acute distress.    Appearance: He is well-developed. He is not diaphoretic.  Neck:     Musculoskeletal: Neck supple.     Thyroid: No thyromegaly.   Cardiovascular:     Rate and Rhythm: Normal rate and regular rhythm.     Pulses: Normal pulses.     Heart sounds: Normal heart sounds.  Pulmonary:     Effort: Pulmonary effort is normal. No respiratory distress.     Breath sounds: Normal breath sounds.  Abdominal:     General: Bowel sounds are normal. There is no distension.     Palpations: Abdomen is soft.     Tenderness: There is no abdominal tenderness.  Genitourinary:    Comments: Foley present  Penile tear present Musculoskeletal:     Right lower leg: No edema.     Left lower leg: No edema.     Comments:  Right side hemiparesis Able to move left extremities       Lymphadenopathy:     Cervical: No cervical adenopathy.  Skin:    General: Skin is warm and dry.  Neurological:     Mental Status: He is alert. Mental status is at baseline.  Psychiatric:        Mood and Affect: Mood normal.     ASSESSMENT/ PLAN:  TODAY  1. Major depression with psychotic features  His mood state is worse: will increase his prozac to 30 mg daily  Will change his seroquel to every other night through 08-20-19 the stop Will continue to monitor his status.     MD is aware of resident's narcotic use and is in agreement with current plan of care. We will attempt to wean resident as appropriate.  Ok Edwards NP [facility]  Contact [phone] Monday through Friday 8am- 5pm  After hours call [phone]

## 2019-09-05 ENCOUNTER — Encounter: Payer: Self-pay | Admitting: Adult Health

## 2019-09-05 ENCOUNTER — Non-Acute Institutional Stay (SKILLED_NURSING_FACILITY): Payer: Medicare Other | Admitting: Adult Health

## 2019-09-05 DIAGNOSIS — E43 Unspecified severe protein-calorie malnutrition: Secondary | ICD-10-CM | POA: Diagnosis not present

## 2019-09-05 DIAGNOSIS — E1151 Type 2 diabetes mellitus with diabetic peripheral angiopathy without gangrene: Secondary | ICD-10-CM | POA: Diagnosis not present

## 2019-09-05 DIAGNOSIS — I610 Nontraumatic intracerebral hemorrhage in hemisphere, subcortical: Secondary | ICD-10-CM | POA: Diagnosis not present

## 2019-09-05 DIAGNOSIS — I6930 Unspecified sequelae of cerebral infarction: Secondary | ICD-10-CM

## 2019-09-05 NOTE — Progress Notes (Signed)
Location:    [facility] Room Number: 130/P Place of Service:  SNF (31)   CODE STATUS: Full Code  No Known Allergies  Chief Complaint  Patient presents with  . Medical Management of Chronic Issues         Type 2 diabetes mellitus with peripheral vascular disease:   Protein calorie malnutrition severe:  Nontraumatic subcortical hemorrhage of left cerebral hemisphere/ehomorragic cerebrovascular accident:    HPI:  He is a 73 year old long term resident of this facility being seen for the management of his chronic illnesses; diabetes; malnutrition; cva. His cbg's remain elevated. There are no reports of uncontrolled pain; no insomnia. He is off seroquel; he has been pulling on his foley. He does have a penile tear.   Past Medical History:  Diagnosis Date  . A-fib ([place])    a. Post-op afib after CABG 08/2012.  [name] Acute gastric ulcer with hemorrhage   . Acute respiratory failure with hypoxia ([place])   . Aphasia following cerebral infarction   . Atrial fibrillation ([place])   . CAD (coronary artery disease)    a. NSTEMI s/p stent to distal RCA 03/2000. b. Inf MI s/p emergent thrombectomy/stenting mid RCA 10/2000. c. NSTEMI s/p CABGx4 (LIMA-LAD, SVG-OM2, seq SVG-acute marginal and PD) 04/13/60 - course complicated by confusion, post-op AF, L pleural effusion with thoracentesis. d. Normal LV function by echo 08/2012.  . Diabetes mellitus   . Diverticulosis   . Dysphagia following cerebral infarction   . Encephalopathy   . Extended spectrum beta lactamase (ESBL) resistance   . Generalized anxiety disorder   . GERD (gastroesophageal reflux disease)   . Hemiplegia and hemiparesis following cerebral infarction affecting right dominant side ([place])   . Hiatal hernia   . HLD (hyperlipidemia)   . HTN (hypertension)   . Non-ST elevation (NSTEMI) myocardial infarction ([place])   . Nontraumatic intracerebral hemorrhage in hemisphere, subcortical ([place])   . Peripheral vascular disease  ([place])    a. Carotid dopplers neg 08/13/12. b. Pre-cabg ABIs - R=0.87 suggesting mild dz, L=1.29 possibly falsely elevated due to calcified vessels.  . Pleural effusion    a. L pleural eff after CABG s/p thoracentesis 08/22/12.  . Prostate cancer [facility])    Status post radiation treatment.  . Prostatic hypertrophy    a. Hx of urinary retention, awaiting TURP.  [name] Severe protein-calorie malnutrition ([place])   . Tobacco abuse   . 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 Valvular heart disease    a. Mild  MR by TEE 08/2012.    Past Surgical History:  Procedure Laterality Date  . APPENDECTOMY    . BACK SURGERY    . CORONARY ARTERY BYPASS GRAFT  08/16/2012   Procedure: CORONARY ARTERY BYPASS GRAFTING (CABG);  Surgeon: Melrose Nakayama, MD;  Location: [facility];  Service: Open Heart Surgery;  Laterality: N/A;  . IR REPLC GASTRO/COLONIC TUBE PERCUT W/FLUORO  02/13/2019  . LEFT HEART CATHETERIZATION WITH CORONARY ANGIOGRAM N/A 08/14/2012   Procedure: LEFT HEART CATHETERIZATION WITH CORONARY ANGIOGRAM;  Surgeon: Peter M Martinique, MD;  Location: [facility] CATH LAB;  Service: Cardiovascular;  Laterality: N/A;    Social History   Socioeconomic History  . Marital status: Married    Spouse name: Not on file  . Number of children: Not on file  . Years of education: Not on file  . Highest education level: Not on file  Occupational History  . Not on file  Social Needs  .  Financial resource strain: Not hard at all  . Food insecurity    Worry: Patient refused    Inability: Patient refused  . Transportation needs    Medical: Patient refused    Non-medical: Patient refused  Tobacco Use  . Smoking status: Former Smoker    Quit date: 11/12/1972    Years since quitting: 46.8  . Smokeless tobacco: Never Used  Substance and Sexual Activity  . Alcohol use: No  . Drug use: No  . Sexual activity: Not Currently  Lifestyle  . Physical activity    Days per week: Patient refused    Minutes per  session: Patient refused  . Stress: Patient refused  Relationships  . Social Herbalist on phone: Patient refused    Gets together: Patient refused    Attends religious service: Patient refused    Active member of club or organization: Patient refused    Attends meetings of clubs or organizations: Patient refused    Relationship status: Patient refused  . Intimate partner violence    Fear of current or ex partner: Patient refused    Emotionally abused: Patient refused    Physically abused: Patient refused    Forced sexual activity: Patient refused  Other Topics Concern  . Not on file  Social History Narrative   He is a long term patient of [facility]    Family History  Problem Relation Age of Onset  . Hypertension Mother       VITAL SIGNS BP 110/64   Pulse (!) 57   Temp 97.8 F (36.6 C) (Oral)   Resp 20   Ht 5' 11"  (1.803 m)   Wt 163 lb 9.6 oz (74.2 kg)   SpO2 98%   BMI 22.82 kg/m   Outpatient Encounter Medications as of 09/05/2019  Medication Sig  . acetaminophen (TYLENOL) 325 MG tablet Take 650 mg by mouth every 4 (four) hours as needed.  [name] amLODipine (NORVASC) 5 MG tablet Take 5 mg by mouth daily.   [name] atorvastatin (LIPITOR) 40 MG tablet Take 40 mg by mouth at bedtime.   . baclofen (LIORESAL) 10 MG tablet Take 1 tablet by mouth 2 (two) times daily.   Roseanne Kaufman Peru-Castor Oil (VENELEX) OINT Apply topically to sacrum, coccyx and bilateral buttocks every shift and as needed for prevention  . FLUoxetine (PROZAC) 20 MG/5ML solution Take 30 mg by mouth daily.   . hydrocortisone cream (PREPARATION H) 1 % Apply rectally twice daily and as needed for hemorrhoids  . insulin aspart (NOVOLOG FLEXPEN) 100 UNIT/ML FlexPen Inject 8 Units into the skin 2 (two) times daily. Give @ 12:00 pm and 5:00 pm  . Insulin Glargine (LANTUS SOLOSTAR) 100 UNIT/ML Solostar Pen Inject 26 Units into the skin every morning.   . lansoprazole (PREVACID SOLUTAB) 30 MG disintegrating tablet Take 30  mg by mouth 2 (two) times daily.   . Melatonin 1 MG TABS Take 2 tablets by mouth at bedtime.   . metoprolol tartrate (LOPRESSOR) 25 MG tablet Take 12.5 mg by mouth 2 (two) times daily.   . NON FORMULARY Diet Type:  Dysphagia 3, thin no straws  . Ostomy Supplies (SKIN PREP SPRAY) MISC Apply to bilateral heels every shift for prevention  . Pramoxine-Calamine (AVEENO ANTI-ITCH) 1-3 % LOTN Apply to the extremities and back daily and prn for itching/ dry skin. Do not apply to the buttocks wound  . Probiotic Product (RISA-BID PROBIOTIC) TABS Take 1 tablet by mouth 2 (two) times a day.  [name]  valsartan (DIOVAN) 40 MG tablet Take 40 mg by mouth 2 (two) times daily.   . [DISCONTINUED] diphenoxylate-atropine (LOMOTIL) 2.5-0.025 MG tablet Take 2 tablets by mouth every 4 (four) hours as needed for diarrhea or loose stools.  . [DISCONTINUED] QUEtiapine (SEROQUEL) 25 MG tablet Take 12.5 mg by mouth at bedtime.    No facility-administered encounter medications on file as of 09/05/2019.      SIGNIFICANT DIAGNOSTIC EXAMS    LABS REVIEWED PREVIOUS:   01-04-19: urine culture: klebsiella oxytoca  01-16-19: wbc 10.1; hgb 13.9; hct 45.2; mcv 96.2; plt 287; glucose 371; bun 44; creat 1.10; k+ 4.7; na++137; ca 8.7 01-28-19: wbc 8.1; hgb 14.9; hct 48.4; mcv 98.;4 plt 282; glucose 285; bun 38; creat 1.17; k+ 4.3 na++ 143 ca 9.3  01-29-19: glucose 263; bun 39; creat 1.17;  k+ 4.0; na++ 139; ca 9.0; ast 52; alt 59  albumin 2.7 hgb a1c 7.6; chol 116; ldl 50; trig 236; hdl 19  03-13-19: wbc 9.3 hgb 14.4; hct 45.0; mcv 93.2; plt 270; chol 106; ldl 58 trig 124; hdl 23; urine micro-albumin 2236.0  05-23-19: wbc 7.0; hgb 14.6; hct 47.4; mcv 93.1; plt 290; glucose 202; bun 30; creat 1.51; k+ 4.0; na++ 140; ca 8.7; ast 50 alt 50 alk phos 131; albumin 2.6 . Hgb a1c 7.5  06-06-19: glucose 85; bun 37; creat 1.50; k+ 4.0; na++ 141; ca 8.8; liver normal albumin 2.9   NO NEW LABS.    Review of Systems  Reason unable to perform ROS:  expressive aphasia     Physical Exam Constitutional:      General: He is not in acute distress.    Appearance: He is well-developed. He is not diaphoretic.  Neck:     Musculoskeletal: Neck supple.     Thyroid: No thyromegaly.  Cardiovascular:     Rate and Rhythm: Normal rate and regular rhythm.     Pulses: Normal pulses.     Heart sounds: Normal heart sounds.  Pulmonary:     Effort: Pulmonary effort is normal. No respiratory distress.     Breath sounds: Normal breath sounds.  Abdominal:     General: Bowel sounds are normal. There is no distension.     Palpations: Abdomen is soft.     Tenderness: There is no abdominal tenderness.  Genitourinary:    Comments: : Foley present  Penile tear present Musculoskeletal:     Right lower leg: No edema.     Left lower leg: No edema.     Comments: Right side hemiparesis  Has some movement of right extremities  Able to move left extremities        Lymphadenopathy:     Cervical: No cervical adenopathy.  Skin:    General: Skin is warm and dry.  Neurological:     Mental Status: He is alert. Mental status is at baseline.  Psychiatric:        Mood and Affect: Mood normal.     ASSESSMENT/ PLAN:  TODAY:   1. Type 2 diabetes mellitus with peripheral vascular disease: cbgs are elevated in the afternoon and evening; will continue lantus 26 units nightly and will change to novolog 10 units with meals.    2. Protein calorie malnutrition severe: is without change weight is stable at 163 pounds; albumin 2.9 will continue to monitor   3. Nontraumatic subcortical hemorrhage of left cerebral hemisphere/ehomorragic cerebrovascular accident: is stable will continue baclofen 10 mg twice daily for spasticity.    PREVIOUS:  4. CAD: native artery, native heart without angina: is status post CABG 2013: is stable will monitor is on statin.   5. Hypertension associated with diabetes: is stable b/p 110/64: will continue norvasc 5 mg daily and lopressor  12.5 mg twice daily diovan 40 mg daily   6. Dyslipidemia associated with type 2 diabetes mellitus: is stable LDL 58; will continue lipitor 40 mg daily   7. Microalbuminuria due to type 2 diabetes mellitus: is without change: is 2236 is on ARB will monitor   8. Major depression with psychotic features: is stable will continue prozac 20 mg daily is presently off seroquel is pulling on foley will need to monitor; he may need this medication back to prevent self injury.   9. CKD stage 3 due to type 2 diabetes mellitus: is stable bun 37; creat 1.50; will monitor   10. Dysphagia oropharyngeal phase: is stable no signs of aspiration is now on thin liquids will monitor   11. Chronic urine retention: without change requires long term foley is followed by urology.    MD is aware of resident's narcotic use and is in agreement with current plan of care. We will attempt to wean resident as appropriate.  Ok Edwards NP [facility] Adult Medicine  Contact [phone] Monday through Friday 8am- 5pm  After hours call [phone]

## 2019-09-06 ENCOUNTER — Encounter (HOSPITAL_COMMUNITY)
Admission: RE | Admit: 2019-09-06 | Discharge: 2019-09-06 | Disposition: A | Discharge status: Home / Self Care | Payer: Medicare Other | Source: Skilled Nursing Facility | Attending: Adult Health | Admitting: Adult Health

## 2019-09-06 DIAGNOSIS — E43 Unspecified severe protein-calorie malnutrition: Secondary | ICD-10-CM | POA: Insufficient documentation

## 2019-09-06 DIAGNOSIS — R1312 Dysphagia, oropharyngeal phase: Secondary | ICD-10-CM | POA: Diagnosis present

## 2019-09-06 DIAGNOSIS — F411 Generalized anxiety disorder: Secondary | ICD-10-CM | POA: Insufficient documentation

## 2019-09-06 DIAGNOSIS — E1165 Type 2 diabetes mellitus with hyperglycemia: Secondary | ICD-10-CM | POA: Insufficient documentation

## 2019-09-06 DIAGNOSIS — I610 Nontraumatic intracerebral hemorrhage in hemisphere, subcortical: Secondary | ICD-10-CM | POA: Insufficient documentation

## 2019-09-06 DIAGNOSIS — R41841 Cognitive communication deficit: Secondary | ICD-10-CM | POA: Diagnosis present

## 2019-09-06 LAB — HEMOGLOBIN A1C
Hgb A1c MFr Bld: 6.4 % — ABNORMAL HIGH (ref 4.8–5.6)
Mean Plasma Glucose: 136.98 mg/dL

## 2019-10-02 ENCOUNTER — Non-Acute Institutional Stay (SKILLED_NURSING_FACILITY): Payer: Medicare Other | Admitting: Adult Health

## 2019-10-02 ENCOUNTER — Encounter: Payer: Self-pay | Admitting: Adult Health

## 2019-10-02 ENCOUNTER — Other Ambulatory Visit: Payer: Self-pay | Admitting: Adult Health

## 2019-10-02 DIAGNOSIS — I10 Essential (primary) hypertension: Secondary | ICD-10-CM | POA: Diagnosis not present

## 2019-10-02 DIAGNOSIS — I610 Nontraumatic intracerebral hemorrhage in hemisphere, subcortical: Secondary | ICD-10-CM | POA: Diagnosis not present

## 2019-10-02 DIAGNOSIS — I152 Hypertension secondary to endocrine disorders: Secondary | ICD-10-CM

## 2019-10-02 DIAGNOSIS — E1151 Type 2 diabetes mellitus with diabetic peripheral angiopathy without gangrene: Secondary | ICD-10-CM | POA: Diagnosis not present

## 2019-10-02 DIAGNOSIS — E1159 Type 2 diabetes mellitus with other circulatory complications: Secondary | ICD-10-CM | POA: Diagnosis not present

## 2019-10-04 ENCOUNTER — Encounter: Payer: Self-pay | Admitting: Adult Health

## 2019-10-04 ENCOUNTER — Non-Acute Institutional Stay (SKILLED_NURSING_FACILITY): Payer: Medicare Other | Admitting: Adult Health

## 2019-10-04 DIAGNOSIS — E1169 Type 2 diabetes mellitus with other specified complication: Secondary | ICD-10-CM | POA: Diagnosis not present

## 2019-10-04 DIAGNOSIS — I10 Essential (primary) hypertension: Secondary | ICD-10-CM | POA: Diagnosis not present

## 2019-10-04 DIAGNOSIS — E1159 Type 2 diabetes mellitus with other circulatory complications: Secondary | ICD-10-CM

## 2019-10-04 DIAGNOSIS — E785 Hyperlipidemia, unspecified: Secondary | ICD-10-CM

## 2019-10-04 DIAGNOSIS — I152 Hypertension secondary to endocrine disorders: Secondary | ICD-10-CM

## 2019-10-04 DIAGNOSIS — I2510 Atherosclerotic heart disease of native coronary artery without angina pectoris: Secondary | ICD-10-CM

## 2019-10-04 LAB — HM DIABETES FOOT EXAM

## 2019-10-04 NOTE — Progress Notes (Signed)
Location:  [facility] Room Number: 939-Q Place of Service:  SNF (31)   CODE STATUS: Full Code  No Known Allergies  Chief Complaint  Patient presents with  . Medical Management of Chronic Issues       CAD: native artery; native heart without angina:  Hypertension associated with diabetes:  Dyslipidemia associated with type 2 diabetes mellitus;    HPI:  He is a 73 year old long term resident of this facility being seen for the management of his chronic illnesses: cad; hypertension; dyslipidemia. There are no reports of aspiration; no reports of changes in appetite; no reports of uncontrolled pain.   Past Medical History:  Diagnosis Date  . A-fib ([place])    a. Post-op afib after CABG 08/2012.  [name] Acute gastric ulcer with hemorrhage   . Acute respiratory failure with hypoxia ([place])   . Aphasia following cerebral infarction   . Atrial fibrillation ([place])   . CAD (coronary artery disease)    a. NSTEMI s/p stent to distal RCA 03/2000. b. Inf MI s/p emergent thrombectomy/stenting mid RCA 10/2000. c. NSTEMI s/p CABGx4 (LIMA-LAD, SVG-OM2, seq SVG-acute marginal and PD) 3/0/09 - course complicated by confusion, post-op AF, L pleural effusion with thoracentesis. d. Normal LV function by echo 08/2012.  . Diabetes mellitus   . Diverticulosis   . Dysphagia following cerebral infarction   . Encephalopathy   . Extended spectrum beta lactamase (ESBL) resistance   . Generalized anxiety disorder   . GERD (gastroesophageal reflux disease)   . Hemiplegia and hemiparesis following cerebral infarction affecting right dominant side ([place])   . Hiatal hernia   . HLD (hyperlipidemia)   . HTN (hypertension)   . Non-ST elevation (NSTEMI) myocardial infarction ([place])   . Nontraumatic intracerebral hemorrhage in hemisphere, subcortical ([place])   . Peripheral vascular disease ([place])    a. Carotid dopplers neg 08/13/12. b. Pre-cabg ABIs - R=0.87 suggesting mild dz, L=1.29 possibly falsely elevated  due to calcified vessels.  . Pleural effusion    a. L pleural eff after CABG s/p thoracentesis 08/22/12.  . Prostate cancer [facility])    Status post radiation treatment.  . Prostatic hypertrophy    a. Hx of urinary retention, awaiting TURP.  [name] Severe protein-calorie malnutrition ([place])   . Tobacco abuse   . 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 Valvular heart disease    a. Mild  MR by TEE 08/2012.    Past Surgical History:  Procedure Laterality Date  . APPENDECTOMY    . BACK SURGERY    . CORONARY ARTERY BYPASS GRAFT  08/16/2012   Procedure: CORONARY ARTERY BYPASS GRAFTING (CABG);  Surgeon: Melrose Nakayama, MD;  Location: [place];  Service: Open Heart Surgery;  Laterality: N/A;  . IR REPLC GASTRO/COLONIC TUBE PERCUT W/FLUORO  02/13/2019  . LEFT HEART CATHETERIZATION WITH CORONARY ANGIOGRAM N/A 08/14/2012   Procedure: LEFT HEART CATHETERIZATION WITH CORONARY ANGIOGRAM;  Surgeon: Peter M Martinique, MD;  Location: [facility] CATH LAB;  Service: Cardiovascular;  Laterality: N/A;    Social History   Socioeconomic History  . Marital status: Married    Spouse name: Not on file  . Number of children: Not on file  . Years of education: Not on file  . Highest education level: Not on file  Occupational History  . Not on file  Social Needs  . Financial resource strain: Not hard at all  . Food insecurity    Worry: Patient refused  Inability: Patient refused  . Transportation needs    Medical: Patient refused    Non-medical: Patient refused  Tobacco Use  . Smoking status: Former Smoker    Quit date: 11/12/1972    Years since quitting: 46.9  . Smokeless tobacco: Never Used  Substance and Sexual Activity  . Alcohol use: No  . Drug use: No  . Sexual activity: Not Currently  Lifestyle  . Physical activity    Days per week: Patient refused    Minutes per session: Patient refused  . Stress: Patient refused  Relationships  . Social Herbalist on phone: Patient refused     Gets together: Patient refused    Attends religious service: Patient refused    Active member of club or organization: Patient refused    Attends meetings of clubs or organizations: Patient refused    Relationship status: Patient refused  . Intimate partner violence    Fear of current or ex partner: Patient refused    Emotionally abused: Patient refused    Physically abused: Patient refused    Forced sexual activity: Patient refused  Other Topics Concern  . Not on file  Social History Narrative   He is a long term patient of [facility]    Family History  Problem Relation Age of Onset  . Hypertension Mother       VITAL SIGNS BP (!) 146/77   Pulse (!) 53   Temp (!) 96.8 F (36 C) (Oral)   Resp 18   Ht 5' 11"  (1.803 m)   Wt 165 lb 12.8 oz (75.2 kg)   SpO2 98%   BMI 23.12 kg/m   Outpatient Encounter Medications as of 10/04/2019  Medication Sig  . acetaminophen (TYLENOL) 325 MG tablet Take 650 mg by mouth every 4 (four) hours as needed.  [name] amLODipine (NORVASC) 5 MG tablet Take 5 mg by mouth daily.   [name] atorvastatin (LIPITOR) 40 MG tablet Take 40 mg by mouth at bedtime.   . baclofen (LIORESAL) 10 MG tablet Take 1 tablet by mouth 2 (two) times daily.   Roseanne Kaufman Peru-Castor Oil (VENELEX) OINT Apply topically to sacrum, coccyx and bilateral buttocks every shift and as needed for prevention  . FLUoxetine (PROZAC) 20 MG/5ML solution Take 30 mg by mouth daily.   . hydrocortisone cream (PREPARATION H) 1 % Apply rectally twice daily and as needed for hemorrhoids  . insulin aspart (NOVOLOG FLEXPEN) 100 UNIT/ML FlexPen Inject 10 Units into the skin 3 (three) times daily with meals. Give @ 12:00 pm and 5:00 pm  . Insulin Glargine (LANTUS SOLOSTAR) 100 UNIT/ML Solostar Pen Inject 26 Units into the skin every morning. Basaglar  . lansoprazole (PREVACID SOLUTAB) 30 MG disintegrating tablet Take 30 mg by mouth 2 (two) times daily.   . Melatonin 1 MG TABS Take 2 tablets by mouth at bedtime.   .  metoprolol tartrate (LOPRESSOR) 25 MG tablet Take 12.5 mg by mouth 2 (two) times daily.   . NON FORMULARY Diet Type:  Dysphagia 3, thin no straws  . Ostomy Supplies (SKIN PREP SPRAY) MISC Apply to bilateral heels every shift for prevention  . Pramoxine-Calamine (AVEENO ANTI-ITCH) 1-3 % LOTN Apply to the extremities and back daily and prn for itching/ dry skin. Do not apply to the buttocks wound  . Probiotic Product (RISA-BID PROBIOTIC) TABS Take 1 tablet by mouth 2 (two) times a day.  . valsartan (DIOVAN) 40 MG tablet Take 40 mg by mouth 2 (two) times daily.  No facility-administered encounter medications on file as of 10/04/2019.      SIGNIFICANT DIAGNOSTIC EXAMS   LABS REVIEWED PREVIOUS:   01-04-19: urine culture: klebsiella oxytoca  01-16-19: wbc 10.1; hgb 13.9; hct 45.2; mcv 96.2; plt 287; glucose 371; bun 44; creat 1.10; k+ 4.7; na++137; ca 8.7 01-28-19: wbc 8.1; hgb 14.9; hct 48.4; mcv 98.;4 plt 282; glucose 285; bun 38; creat 1.17; k+ 4.3 na++ 143 ca 9.3  01-29-19: glucose 263; bun 39; creat 1.17;  k+ 4.0; na++ 139; ca 9.0; ast 52; alt 59  albumin 2.7 hgb a1c 7.6; chol 116; ldl 50; trig 236; hdl 19  03-13-19: wbc 9.3 hgb 14.4; hct 45.0; mcv 93.2; plt 270; chol 106; ldl 58 trig 124; hdl 23; urine micro-albumin 2236.0  05-23-19: wbc 7.0; hgb 14.6; hct 47.4; mcv 93.1; plt 290; glucose 202; bun 30; creat 1.51; k+ 4.0; na++ 140; ca 8.7; ast 50 alt 50 alk phos 131; albumin 2.6 . Hgb a1c 7.5  06-06-19: glucose 85; bun 37; creat 1.50; k+ 4.0; na++ 141; ca 8.8; liver normal albumin 2.9   TODAY;   09-06-19: hgb a1c 6.4   Review of Systems  Reason unable to perform ROS: expressive aphasia      Physical Exam Constitutional:      General: He is not in acute distress.    Appearance: He is well-developed. He is not diaphoretic.  Neck:     Musculoskeletal: Neck supple.     Thyroid: No thyromegaly.  Cardiovascular:     Rate and Rhythm: Normal rate and regular rhythm.     Pulses: Normal  pulses.     Heart sounds: Normal heart sounds.  Pulmonary:     Effort: Pulmonary effort is normal. No respiratory distress.     Breath sounds: Normal breath sounds.  Abdominal:     General: Bowel sounds are normal. There is no distension.     Palpations: Abdomen is soft.     Tenderness: There is no abdominal tenderness.  Genitourinary:    Comments: Foley present  Penile tear present Musculoskeletal:     Right lower leg: No edema.     Left lower leg: No edema.     Comments: Right side hemiparesis  Has some movement of right extremities  Able to move left extremities          Lymphadenopathy:     Cervical: No cervical adenopathy.  Skin:    General: Skin is warm and dry.  Neurological:     Mental Status: He is alert. Mental status is at baseline.  Psychiatric:        Mood and Affect: Mood normal.      ASSESSMENT/ PLAN:  TODAY:   1. CAD: native artery; native heart without angina: is status post CABG 2013; will monitor is on status; lopressor  2. Hypertension associated with diabetes: is stable b/p 146/77 will continue norvasc 5 mg daily and lopressor 12.5 mg twice daily   3. Dyslipidemia associated with type 2 diabetes mellitus; is stable LDL 58 will continue lipitor 40 mg daily    PREVIOUS:   4. Microalbuminuria due to type 2 diabetes mellitus: is without change: is 2236 is on ARB will monitor   5. Major depression with psychotic features: is stable will continue prozac 20 mg daily is presently off seroquel will monitor   6. CKD stage 3 due to type 2 diabetes mellitus: is stable bun 37; creat 1.50; will monitor   7. Dysphagia oropharyngeal phase: is stable no  signs of aspiration is now on thin liquids will monitor   8. Chronic urine retention: without change requires long term foley is followed by urology.   9. Type 2 diabetes mellitus with peripheral vascular disease: hgb a1c 6.4 will continue lantus 26 units nightly and will change to novolog 10 units with meals.     10. Protein calorie malnutrition severe: is without change weight is stable at 165 pounds; albumin 2.9 will continue to monitor   11. Nontraumatic subcortical hemorrhage of left cerebral hemisphere/ehomorragic cerebrovascular accident: is stable will continue baclofen 10 mg twice daily for spasticity.         MD is aware of resident's narcotic use and is in agreement with current plan of care. We will attempt to wean resident as appropriate.  Ok Edwards NP [facility]  Contact [phone] Monday through Friday 8am- 5pm  After hours call [phone]

## 2019-10-07 ENCOUNTER — Encounter (HOSPITAL_COMMUNITY)
Admission: RE | Admit: 2019-10-07 | Discharge: 2019-10-07 | Disposition: A | Discharge status: Home / Self Care | Payer: Medicare Other | Source: Skilled Nursing Facility | Attending: Adult Health | Admitting: Adult Health

## 2019-10-07 DIAGNOSIS — E43 Unspecified severe protein-calorie malnutrition: Secondary | ICD-10-CM | POA: Diagnosis present

## 2019-10-07 DIAGNOSIS — I610 Nontraumatic intracerebral hemorrhage in hemisphere, subcortical: Secondary | ICD-10-CM | POA: Diagnosis present

## 2019-10-07 DIAGNOSIS — R1312 Dysphagia, oropharyngeal phase: Secondary | ICD-10-CM | POA: Diagnosis present

## 2019-10-07 DIAGNOSIS — R41841 Cognitive communication deficit: Secondary | ICD-10-CM | POA: Diagnosis present

## 2019-10-07 DIAGNOSIS — F411 Generalized anxiety disorder: Secondary | ICD-10-CM | POA: Diagnosis present

## 2019-10-07 DIAGNOSIS — E1165 Type 2 diabetes mellitus with hyperglycemia: Secondary | ICD-10-CM | POA: Insufficient documentation

## 2019-10-07 DIAGNOSIS — Z125 Encounter for screening for malignant neoplasm of prostate: Secondary | ICD-10-CM | POA: Insufficient documentation

## 2019-10-07 DIAGNOSIS — E785 Hyperlipidemia, unspecified: Secondary | ICD-10-CM | POA: Insufficient documentation

## 2019-10-07 LAB — MAGNESIUM: Magnesium: 2 mg/dL (ref 1.7–2.4)

## 2019-10-07 LAB — PSA: Prostatic Specific Antigen: 0.36 ng/mL (ref 0.00–4.00)

## 2019-10-07 LAB — LIPID PANEL
Cholesterol: 102 mg/dL (ref 0–200)
HDL: 23 mg/dL — ABNORMAL LOW (ref >40)
LDL Cholesterol: 63 mg/dL (ref 0–99)
Total CHOL/HDL Ratio: 4.4 RATIO
Triglycerides: 81 mg/dL (ref <150)
VLDL: 16 mg/dL (ref 0–40)

## 2019-10-07 NOTE — Progress Notes (Signed)
Location:   [facility] Room Number: 130-P Place of Service:  SNF (31)   CODE STATUS: full code  No Known Allergies  Chief Complaint  Patient presents with  . Acute Visit    care plan meeting     HPI:  We have come together for his care plan meeting. No BIMS mood 0/30. His wife is present via phone. His weight is stable; no episodes of aspiration present. He continues to require extensive assist with his adls. He has had fall without injury. There are no reports of agitation or anxiety. There are no reports of uncontrolled pain. He continues to be followed for his chronic illnesses including diabetes hypertension; cva.   Past Medical History:  Diagnosis Date  . A-fib ([place])    a. Post-op afib after CABG 08/2012.  [name] Acute gastric ulcer with hemorrhage   . Acute respiratory failure with hypoxia ([place])   . Aphasia following cerebral infarction   . Atrial fibrillation ([place])   . CAD (coronary artery disease)    a. NSTEMI s/p stent to distal RCA 03/2000. b. Inf MI s/p emergent thrombectomy/stenting mid RCA 10/2000. c. NSTEMI s/p CABGx4 (LIMA-LAD, SVG-OM2, seq SVG-acute marginal and PD) 12/17/08 - course complicated by confusion, post-op AF, L pleural effusion with thoracentesis. d. Normal LV function by echo 08/2012.  . Diabetes mellitus   . Diverticulosis   . Dysphagia following cerebral infarction   . Encephalopathy   . Extended spectrum beta lactamase (ESBL) resistance   . Generalized anxiety disorder   . GERD (gastroesophageal reflux disease)   . Hemiplegia and hemiparesis following cerebral infarction affecting right dominant side ([place])   . Hiatal hernia   . HLD (hyperlipidemia)   . HTN (hypertension)   . Non-ST elevation (NSTEMI) myocardial infarction ([place])   . Nontraumatic intracerebral hemorrhage in hemisphere, subcortical ([place])   . Peripheral vascular disease ([place])    a. Carotid dopplers neg 08/13/12. b. Pre-cabg ABIs - R=0.87 suggesting mild dz, L=1.29 possibly  falsely elevated due to calcified vessels.  . Pleural effusion    a. L pleural eff after CABG s/p thoracentesis 08/22/12.  . Prostate cancer [facility])    Status post radiation treatment.  . Prostatic hypertrophy    a. Hx of urinary retention, awaiting TURP.  [name] Severe protein-calorie malnutrition ([place])   . Tobacco abuse   . 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 Valvular heart disease    a. Mild  MR by TEE 08/2012.    Past Surgical History:  Procedure Laterality Date  . APPENDECTOMY    . BACK SURGERY    . CORONARY ARTERY BYPASS GRAFT  08/16/2012   Procedure: CORONARY ARTERY BYPASS GRAFTING (CABG);  Surgeon: Melrose Nakayama, MD;  Location: [place];  Service: Open Heart Surgery;  Laterality: N/A;  . IR REPLC GASTRO/COLONIC TUBE PERCUT W/FLUORO  02/13/2019  . LEFT HEART CATHETERIZATION WITH CORONARY ANGIOGRAM N/A 08/14/2012   Procedure: LEFT HEART CATHETERIZATION WITH CORONARY ANGIOGRAM;  Surgeon: Peter M Martinique, MD;  Location: [facility] CATH LAB;  Service: Cardiovascular;  Laterality: N/A;    Social History   Socioeconomic History  . Marital status: Married    Spouse name: Not on file  . Number of children: Not on file  . Years of education: Not on file  . Highest education level: Not on file  Occupational History  . Not on file  Social Needs  . Financial resource strain: Not hard at all  . Food insecurity  Worry: Patient refused    Inability: Patient refused  . Transportation needs    Medical: Patient refused    Non-medical: Patient refused  Tobacco Use  . Smoking status: Former Smoker    Quit date: 11/12/1972    Years since quitting: 46.9  . Smokeless tobacco: Never Used  Substance and Sexual Activity  . Alcohol use: No  . Drug use: No  . Sexual activity: Not Currently  Lifestyle  . Physical activity    Days per week: Patient refused    Minutes per session: Patient refused  . Stress: Patient refused  Relationships  . Social Herbalist on phone:  Patient refused    Gets together: Patient refused    Attends religious service: Patient refused    Active member of club or organization: Patient refused    Attends meetings of clubs or organizations: Patient refused    Relationship status: Patient refused  . Intimate partner violence    Fear of current or ex partner: Patient refused    Emotionally abused: Patient refused    Physically abused: Patient refused    Forced sexual activity: Patient refused  Other Topics Concern  . Not on file  Social History Narrative   He is a long term patient of [facility]    Family History  Problem Relation Age of Onset  . Hypertension Mother       VITAL SIGNS BP (!) 146/77   Pulse (!) 53   Temp 97.7 F (36.5 C) (Oral)   Resp 18   Ht 5' 11"  (1.803 m)   Wt 165 lb 12.8 oz (75.2 kg)   SpO2 98%   BMI 23.12 kg/m   Outpatient Encounter Medications as of 10/02/2019  Medication Sig  . amLODipine (NORVASC) 5 MG tablet Take 5 mg by mouth daily.   . baclofen (LIORESAL) 10 MG tablet Take 1 tablet by mouth 2 (two) times daily.   . metoprolol tartrate (LOPRESSOR) 25 MG tablet Take 12.5 mg by mouth 2 (two) times daily.   [name] acetaminophen (TYLENOL) 325 MG tablet Take 650 mg by mouth every 4 (four) hours as needed.  [name] atorvastatin (LIPITOR) 40 MG tablet Take 40 mg by mouth at bedtime.   Roseanne Kaufman [place]-Castor Oil (VENELEX) OINT Apply topically to sacrum, coccyx and bilateral buttocks every shift and as needed for prevention  . FLUoxetine (PROZAC) 20 MG/5ML solution Take 30 mg by mouth daily.   . hydrocortisone cream (PREPARATION H) 1 % Apply rectally twice daily and as needed for hemorrhoids  . insulin aspart (NOVOLOG FLEXPEN) 100 UNIT/ML FlexPen Inject 10 Units into the skin 3 (three) times daily with meals. Give @ 12:00 pm and 5:00 pm  . Insulin Glargine (LANTUS SOLOSTAR) 100 UNIT/ML Solostar Pen Inject 26 Units into the skin every morning. Basaglar  . lansoprazole (PREVACID SOLUTAB) 30 MG disintegrating tablet  Take 30 mg by mouth 2 (two) times daily.   . Melatonin 1 MG TABS Take 2 tablets by mouth at bedtime.   . NON FORMULARY Diet Type:  Dysphagia 3, thin no straws  . Ostomy Supplies (SKIN PREP SPRAY) MISC Apply to bilateral heels every shift for prevention  . Pramoxine-Calamine (AVEENO ANTI-ITCH) 1-3 % LOTN Apply to the extremities and back daily and prn for itching/ dry skin. Do not apply to the buttocks wound  . Probiotic Product (RISA-BID PROBIOTIC) TABS Take 1 tablet by mouth 2 (two) times a day.  . valsartan (DIOVAN) 40 MG tablet Take 40 mg by  mouth 2 (two) times daily.    No facility-administered encounter medications on file as of 10/02/2019.      SIGNIFICANT DIAGNOSTIC EXAMS   LABS REVIEWED PREVIOUS:   01-04-19: urine culture: klebsiella oxytoca  01-16-19: wbc 10.1; hgb 13.9; hct 45.2; mcv 96.2; plt 287; glucose 371; bun 44; creat 1.10; k+ 4.7; na++137; ca 8.7 01-28-19: wbc 8.1; hgb 14.9; hct 48.4; mcv 98.;4 plt 282; glucose 285; bun 38; creat 1.17; k+ 4.3 na++ 143 ca 9.3  01-29-19: glucose 263; bun 39; creat 1.17;  k+ 4.0; na++ 139; ca 9.0; ast 52; alt 59  albumin 2.7 hgb a1c 7.6; chol 116; ldl 50; trig 236; hdl 19  03-13-19: wbc 9.3 hgb 14.4; hct 45.0; mcv 93.2; plt 270; chol 106; ldl 58 trig 124; hdl 23; urine micro-albumin 2236.0  05-23-19: wbc 7.0; hgb 14.6; hct 47.4; mcv 93.1; plt 290; glucose 202; bun 30; creat 1.51; k+ 4.0; na++ 140; ca 8.7; ast 50 alt 50 alk phos 131; albumin 2.6 . Hgb a1c 7.5  06-06-19: glucose 85; bun 37; creat 1.50; k+ 4.0; na++ 141; ca 8.8; liver normal albumin 2.9   TODAY.   09-06-19: hgb a1c 6.1   Review of Systems  Reason unable to perform ROS: expressive aphasia     Physical Exam Constitutional:      General: He is not in acute distress.    Appearance: He is well-developed. He is not diaphoretic.  Neck:     Musculoskeletal: Neck supple.     Thyroid: No thyromegaly.  Cardiovascular:     Rate and Rhythm: Normal rate and regular rhythm.     Pulses:  Normal pulses.     Heart sounds: Normal heart sounds.  Pulmonary:     Effort: Pulmonary effort is normal. No respiratory distress.     Breath sounds: Normal breath sounds.  Abdominal:     General: Bowel sounds are normal. There is no distension.     Palpations: Abdomen is soft.     Tenderness: There is no abdominal tenderness.  Genitourinary:    Comments: Foley present  Penile tear present Musculoskeletal: Normal range of motion.     Right lower leg: No edema.     Left lower leg: No edema.     Comments: Right side hemiparesis  Has some movement of right extremities  Able to move left extremities         Lymphadenopathy:     Cervical: No cervical adenopathy.  Skin:    General: Skin is warm and dry.  Neurological:     Mental Status: He is alert. Mental status is at baseline.  Psychiatric:        Mood and Affect: Mood normal.       ASSESSMENT/ PLAN:  TODAY;   1. Hypertension associated with diabetes 2. Type 2 diabetes mellitus with peripheral vascular disease 3. Nontraumatic subcortical hemorrhage of left cerebral hemisphere   Will continue current medications Will continue current plan of care Will continue to monitor his status.      MD is aware of resident's narcotic use and is in agreement with current plan of care. We will attempt to wean resident as appropriate.  Ok Edwards NP [facility]  Contact [phone] Monday through Friday 8am- 5pm  After hours call [phone]

## 2019-10-21 LAB — HM DIABETES EYE EXAM

## 2019-10-25 ENCOUNTER — Non-Acute Institutional Stay (SKILLED_NURSING_FACILITY): Payer: Medicare Other | Admitting: Adult Health

## 2019-10-25 ENCOUNTER — Encounter: Payer: Self-pay | Admitting: Adult Health

## 2019-10-25 DIAGNOSIS — E1151 Type 2 diabetes mellitus with diabetic peripheral angiopathy without gangrene: Secondary | ICD-10-CM | POA: Diagnosis not present

## 2019-10-25 NOTE — Progress Notes (Signed)
Location:  [place] Room Number: 620-B Place of Service:  SNF (31)   CODE STATUS: FULL CODE   No Known Allergies  Chief Complaint  Patient presents with  . Acute Visit    Patient is seen for followup of CBGs    HPI:  He has had some early morning low cbg readings. There are no reports of excessive hunger. These reading have been easily resolved. There are no reports of agitation; no reports of changes in appetite.   Past Medical History:  Diagnosis Date  . A-fib ([place])    a. Post-op afib after CABG 08/2012.  [name] Acute gastric ulcer with hemorrhage   . Acute respiratory failure with hypoxia ([place])   . Aphasia following cerebral infarction   . Atrial fibrillation ([place])   . CAD (coronary artery disease)    a. NSTEMI s/p stent to distal RCA 03/2000. b. Inf MI s/p emergent thrombectomy/stenting mid RCA 10/2000. c. NSTEMI s/p CABGx4 (LIMA-LAD, SVG-OM2, seq SVG-acute marginal and PD) 04/15/96 - course complicated by confusion, post-op AF, L pleural effusion with thoracentesis. d. Normal LV function by echo 08/2012.  . Diabetes mellitus   . Diverticulosis   . Dysphagia following cerebral infarction   . Encephalopathy   . Extended spectrum beta lactamase (ESBL) resistance   . Generalized anxiety disorder   . GERD (gastroesophageal reflux disease)   . Hemiplegia and hemiparesis following cerebral infarction affecting right dominant side ([place])   . Hiatal hernia   . HLD (hyperlipidemia)   . HTN (hypertension)   . Non-ST elevation (NSTEMI) myocardial infarction ([place])   . Nontraumatic intracerebral hemorrhage in hemisphere, subcortical ([place])   . Peripheral vascular disease ([place])    a. Carotid dopplers neg 08/13/12. b. Pre-cabg ABIs - R=0.87 suggesting mild dz, L=1.29 possibly falsely elevated due to calcified vessels.  . Pleural effusion    a. L pleural eff after CABG s/p thoracentesis 08/22/12.  . Prostate cancer [facility])    Status post radiation treatment.  . Prostatic  hypertrophy    a. Hx of urinary retention, awaiting TURP.  [name] Severe protein-calorie malnutrition ([place])   . Tobacco abuse   . 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 Valvular heart disease    a. Mild  MR by TEE 08/2012.    Past Surgical History:  Procedure Laterality Date  . APPENDECTOMY    . BACK SURGERY    . CORONARY ARTERY BYPASS GRAFT  08/16/2012   Procedure: CORONARY ARTERY BYPASS GRAFTING (CABG);  Surgeon: Melrose Nakayama, MD;  Location: [place];  Service: Open Heart Surgery;  Laterality: N/A;  . IR REPLC GASTRO/COLONIC TUBE PERCUT W/FLUORO  02/13/2019  . LEFT HEART CATHETERIZATION WITH CORONARY ANGIOGRAM N/A 08/14/2012   Procedure: LEFT HEART CATHETERIZATION WITH CORONARY ANGIOGRAM;  Surgeon: Peter M Martinique, MD;  Location: [facility] CATH LAB;  Service: Cardiovascular;  Laterality: N/A;    Social History   Socioeconomic History  . Marital status: Married    Spouse name: Not on file  . Number of children: Not on file  . Years of education: Not on file  . Highest education level: Not on file  Occupational History  . Not on file  Social Needs  . Financial resource strain: Not hard at all  . Food insecurity    Worry: Patient refused    Inability: Patient refused  . Transportation needs    Medical: Patient refused    Non-medical: Patient refused  Tobacco Use  . Smoking  status: Former Smoker    Quit date: 11/12/1972    Years since quitting: 46.9  . Smokeless tobacco: Never Used  Substance and Sexual Activity  . Alcohol use: No  . Drug use: No  . Sexual activity: Not Currently  Lifestyle  . Physical activity    Days per week: Patient refused    Minutes per session: Patient refused  . Stress: Patient refused  Relationships  . Social Herbalist on phone: Patient refused    Gets together: Patient refused    Attends religious service: Patient refused    Active member of club or organization: Patient refused    Attends meetings of clubs or  organizations: Patient refused    Relationship status: Patient refused  . Intimate partner violence    Fear of current or ex partner: Patient refused    Emotionally abused: Patient refused    Physically abused: Patient refused    Forced sexual activity: Patient refused  Other Topics Concern  . Not on file  Social History Narrative   He is a long term patient of [facility]    Family History  Problem Relation Age of Onset  . Hypertension Mother       VITAL SIGNS BP 136/80   Pulse 60   Temp (!) 97.3 F (36.3 C) (Oral)   Resp 18   Ht _0  (1.803 m)   Wt 162 lb 12.8 oz (73.8 kg)   SpO2 98%   BMI 22.71 kg/m   Outpatient Encounter Medications as of 10/25/2019  Medication Sig  . acetaminophen (TYLENOL) 325 MG tablet Take 650 mg by mouth every 4 (four) hours as needed.  [name] amLODipine (NORVASC) 5 MG tablet Take 5 mg by mouth daily.   [name] atorvastatin (LIPITOR) 40 MG tablet Take 40 mg by mouth at bedtime.   . baclofen (LIORESAL) 10 MG tablet Take 1 tablet by mouth 2 (two) times daily.   Roseanne Kaufman Peru-Castor Oil (VENELEX) OINT Apply topically to sacrum, coccyx and bilateral buttocks every shift and as needed for prevention  . FLUoxetine (PROZAC) 20 MG/5ML solution Take 30 mg by mouth daily.   . hydrocortisone cream (PREPARATION H) 1 % Apply rectally twice daily and as needed for hemorrhoids  . insulin aspart (NOVOLOG FLEXPEN) 100 UNIT/ML FlexPen Inject 10 Units into the skin 3 (three) times daily with meals. Give @ 12:00 pm and 5:00 pm  . Insulin Glargine (BASAGLAR KWIKPEN) 100 UNIT/ML SOPN Inject 20 Units into the skin daily.  . lansoprazole (PREVACID SOLUTAB) 30 MG disintegrating tablet Take 30 mg by mouth 2 (two) times daily.   . Melatonin 1 MG TABS Take 2 tablets by mouth at bedtime.   . metoprolol tartrate (LOPRESSOR) 25 MG tablet Take 12.5 mg by mouth 2 (two) times daily.   . NON FORMULARY Diet Type:  Dysphagia 3, thin no straws  . Ostomy Supplies (SKIN PREP SPRAY) MISC Apply to  bilateral heels every shift for prevention  . Pramoxine-Calamine (AVEENO ANTI-ITCH) 1-3 % LOTN Apply to the extremities and back daily and prn for itching/ dry skin. Do not apply to the buttocks wound  . Probiotic Product (RISA-BID PROBIOTIC) TABS Take 1 tablet by mouth 2 (two) times a day.  . valsartan (DIOVAN) 40 MG tablet Take 40 mg by mouth 2 (two) times daily.   . [DISCONTINUED] Insulin Glargine (LANTUS SOLOSTAR) 100 UNIT/ML Solostar Pen Inject 26 Units into the skin every morning. Basaglar   No facility-administered encounter medications on file as  of 10/25/2019.      SIGNIFICANT DIAGNOSTIC EXAMS    LABS REVIEWED PREVIOUS:   01-04-19: urine culture: klebsiella oxytoca  01-16-19: wbc 10.1; hgb 13.9; hct 45.2; mcv 96.2; plt 287; glucose 371; bun 44; creat 1.10; k+ 4.7; na++137; ca 8.7 01-28-19: wbc 8.1; hgb 14.9; hct 48.4; mcv 98.;4 plt 282; glucose 285; bun 38; creat 1.17; k+ 4.3 na++ 143 ca 9.3  01-29-19: glucose 263; bun 39; creat 1.17;  k+ 4.0; na++ 139; ca 9.0; ast 52; alt 59  albumin 2.7 hgb a1c 7.6; chol 116; ldl 50; trig 236; hdl 19  03-13-19: wbc 9.3 hgb 14.4; hct 45.0; mcv 93.2; plt 270; chol 106; ldl 58 trig 124; hdl 23; urine micro-albumin 2236.0  05-23-19: wbc 7.0; hgb 14.6; hct 47.4; mcv 93.1; plt 290; glucose 202; bun 30; creat 1.51; k+ 4.0; na++ 140; ca 8.7; ast 50 alt 50 alk phos 131; albumin 2.6 . Hgb a1c 7.5  06-06-19: glucose 85; bun 37; creat 1.50; k+ 4.0; na++ 141; ca 8.8; liver normal albumin 2.9  09-06-19: hgb a1c 6.4  NO NEW LABS.   Review of Systems  Reason unable to perform ROS: expressive aphasia    Physical Exam Constitutional:      General: He is not in acute distress.    Appearance: He is well-developed. He is not diaphoretic.  Neck:     Musculoskeletal: Neck supple.     Thyroid: No thyromegaly.  Cardiovascular:     Rate and Rhythm: Normal rate and regular rhythm.     Pulses: Normal pulses.     Heart sounds: Normal heart sounds.  Pulmonary:      Effort: Pulmonary effort is normal. No respiratory distress.     Breath sounds: Normal breath sounds.  Abdominal:     General: Bowel sounds are normal. There is no distension.     Palpations: Abdomen is soft.     Tenderness: There is no abdominal tenderness.  Genitourinary:    Comments: Foley present  Penile tear present Musculoskeletal:     Right lower leg: No edema.     Left lower leg: No edema.     Comments:  Right side hemiparesis  Has some movement of right extremities  Able to move left extremities           Lymphadenopathy:     Cervical: No cervical adenopathy.  Skin:    General: Skin is warm and dry.  Neurological:     Mental Status: He is alert. Mental status is at baseline.  Psychiatric:        Mood and Affect: Mood normal.       ASSESSMENT/ PLAN:  TODAY;   1. Type 2 diabetes mellitus with peripheral vascular disease: hgb a1c 6.4 is worse; will continue novolog 10 units with meals; will lower lantus to 20 units nightly will monitor    MD is aware of resident's narcotic use and is in agreement with current plan of care. We will attempt to wean resident as appropriate.  Ok Edwards NP [facility]  Contact [phone] Monday through Friday 8am- 5pm  After hours call [phone]

## 2019-10-30 ENCOUNTER — Non-Acute Institutional Stay (SKILLED_NURSING_FACILITY): Payer: Medicare Other | Admitting: Adult Health

## 2019-10-30 ENCOUNTER — Encounter: Payer: Self-pay | Admitting: Adult Health

## 2019-10-30 DIAGNOSIS — I152 Hypertension secondary to endocrine disorders: Secondary | ICD-10-CM

## 2019-10-30 DIAGNOSIS — I10 Essential (primary) hypertension: Secondary | ICD-10-CM

## 2019-10-30 DIAGNOSIS — I610 Nontraumatic intracerebral hemorrhage in hemisphere, subcortical: Secondary | ICD-10-CM

## 2019-10-30 DIAGNOSIS — R339 Retention of urine, unspecified: Secondary | ICD-10-CM

## 2019-10-30 DIAGNOSIS — Z0000 Encounter for general adult medical examination without abnormal findings: Secondary | ICD-10-CM | POA: Diagnosis not present

## 2019-10-30 DIAGNOSIS — E1159 Type 2 diabetes mellitus with other circulatory complications: Secondary | ICD-10-CM | POA: Diagnosis not present

## 2019-10-30 NOTE — Progress Notes (Signed)
Location:  [name] Room Number: 161-W Place of Service:  SNF (31) Provider: Ok Edwards   Patient Care Team: Hennie Duos, MD as PCP - General (Internal Medicine) Tanner Montgomery, MD as Consulting Physician (Urology) [name] Phylis Bougie, NP as Nurse Practitioner (Geriatric Medicine)  Extended Emergency Contact Information Primary Emergency Contact: [name] Address: [address]          [place] [address] [name] of [place] Phone: [phone] Mobile Phone: [phone] Relation: Spouse Secondary Emergency Contact: [name] [place] Phone: [phone] Relation: Son  Code Status: full code  Goals of Care: Advanced Directive information Advanced Directives 09/05/2019  Does Patient Have a Medical Advance Directive? Yes  Type of Advance Directive (No Data)  Does patient want to make changes to medical advance directive? No - Patient declined  Would patient like information on creating a medical advance directive? No - Patient declined  Pre-existing out of facility DNR order (yellow form or pink MOST form) -     Chief Complaint  Patient presents with  . Medicare Wellness    Annual Wellness visit    HPI: Patient is a 73 y.o. male seen in today for an annual wellness exam.  He has been hospitalized in March of this year for a cva. He was peg tube dependent; is now eating and has had the tube removed. He has had several falls without injury. His weight is stable. He is off seroquel. There are no reports of agitation or anxiety. He does pull off the foley guard on his leg. He continues to be followed for his chronic illnesses including: cva; hypertension; urine retention   Fall Risk  10/30/2019  Falls in the past year? 1  Number falls in past yr: 1  Injury with Fall? 0  Risk for fall due to : History of fall(s);Impaired mobility     Health Maintenance  Topic Date Due  . Hepatitis C Screening   [birth year]  . OPHTHALMOLOGY EXAM  06/14/1954  . COLONOSCOPY  06/14/1994  . HEMOGLOBIN A1C  03/05/2020  . URINE MICROALBUMIN  03/12/2020  . PNA vac Low Risk Adult (2 of 2 - PCV13) 09/02/2020  . FOOT EXAM  10/03/2020  . TETANUS/TDAP  09/02/2029  . INFLUENZA VACCINE  Completed     Past Medical History:  Diagnosis Date  . A-fib ([place])    a. Post-op afib after CABG 08/2012.  [name] Acute gastric ulcer with hemorrhage   . Acute respiratory failure with hypoxia ([place])   . Aphasia following cerebral infarction   . Atrial fibrillation ([place])   . CAD (coronary artery disease)    a. NSTEMI s/p stent to distal RCA 03/2000. [name]. Inf MI s/p emergent thrombectomy/stenting mid RCA 10/2000. c. NSTEMI s/p CABGx4 (LIMA-LAD, SVG-OM2, seq SVG-acute marginal and PD) 04/13/83 - course complicated by confusion, post-op AF, L pleural effusion with thoracentesis. d. Normal LV function by echo 08/2012.  . Diabetes mellitus   . Diverticulosis   . Dysphagia following cerebral infarction   . Encephalopathy   . Extended spectrum beta lactamase (ESBL) resistance   . Generalized anxiety disorder   . GERD (gastroesophageal reflux disease)   . Hemiplegia and hemiparesis following cerebral infarction affecting right dominant side ([name])   . Hiatal hernia   . HLD (hyperlipidemia)   . HTN (hypertension)   . Non-ST elevation (NSTEMI) myocardial infarction ([name])   . Nontraumatic intracerebral hemorrhage in hemisphere, subcortical ([name])   .  Peripheral vascular disease ([name])    a. Carotid dopplers neg 08/13/12. [name]. Pre-cabg ABIs - R=0.87 suggesting mild dz, L=1.29 possibly falsely elevated due to calcified vessels.  . Pleural effusion    a. L pleural eff after CABG s/p thoracentesis 08/22/12.  . Prostate cancer [name])    Status post radiation treatment.  . Prostatic hypertrophy    a. Hx of urinary retention, awaiting TURP.  [name] Severe protein-calorie malnutrition ([place])   . Tobacco abuse   . 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 Valvular heart disease    a. Mild  MR by TEE 08/2012.    Past Surgical History:  Procedure Laterality Date  . APPENDECTOMY    . BACK SURGERY    . CORONARY ARTERY BYPASS GRAFT  08/16/2012   Procedure: CORONARY ARTERY BYPASS GRAFTING (CABG);  Surgeon: Melrose Nakayama, MD;  Location: [name];  Service: Open Heart Surgery;  Laterality: N/A;  . IR REPLC GASTRO/COLONIC TUBE PERCUT W/FLUORO  02/13/2019  . LEFT HEART CATHETERIZATION WITH CORONARY ANGIOGRAM N/A 08/14/2012   Procedure: LEFT HEART CATHETERIZATION WITH CORONARY ANGIOGRAM;  Surgeon: Peter M Martinique, MD;  Location: [name];  Service: Cardiovascular;  Laterality: N/A;    Family History  Problem Relation Age of Onset  . Hypertension Mother     Social History   Socioeconomic History  . Marital status: Married    Spouse name: Not on file  . Number of children: Not on file  . Years of education: Not on file  . Highest education level: Not on file  Occupational History  . Occupation: retired   Scientific laboratory technician  . Financial resource strain: Not hard at all  . Food insecurity    Worry: Patient refused    Inability: Patient refused  . Transportation needs    Medical: Patient refused    Non-medical: Patient refused  Tobacco Use  . Smoking status: Former Smoker    Quit date: 11/12/1972    Years since quitting: 46.9  . Smokeless tobacco: Never Used  Substance and Sexual Activity  . Alcohol use: No  . Drug use: No  . Sexual activity: Not Currently  Lifestyle  . Physical activity    Days per week: 0 days    Minutes per session: 0 min  . Stress: Patient refused  Relationships  . Social Herbalist on phone: Patient refused    Gets together: Patient refused    Attends religious service: Patient refused    Active member of club or organization: Patient refused    Attends meetings of clubs or organizations: Patient refused    Relationship status: Patient refused  Other Topics Concern  . Not on file   Social History Narrative   He is a long term patient of [name]     reports that he quit smoking about 46 years ago. He has never used smokeless tobacco. He reports that he does not drink alcohol or use drugs.   No Known Allergies  Outpatient Encounter Medications as of 10/30/2019  Medication Sig  . acetaminophen (TYLENOL) 325 MG tablet Take 650 mg by mouth every 4 (four) hours as needed.  [name] amLODipine (NORVASC) 5 MG tablet Take 5 mg by mouth daily.   [name] atorvastatin (LIPITOR) 40 MG tablet Take 40 mg by mouth at bedtime.   . baclofen (LIORESAL) 10 MG tablet Take 1 tablet by mouth 2 (two) times daily.   Roseanne Kaufman [place]-Castor Oil (VENELEX) OINT Apply topically to  sacrum, coccyx and bilateral buttocks every shift and as needed for prevention  . FLUoxetine (PROZAC) 20 MG/5ML solution Take 30 mg by mouth daily.   . hydrocortisone cream (PREPARATION H) 1 % Apply rectally twice daily and as needed for hemorrhoids  . insulin aspart (NOVOLOG FLEXPEN) 100 UNIT/ML FlexPen Inject 10 Units into the skin 3 (three) times daily with meals. Give @ 12:00 pm and 5:00 pm  . Insulin Glargine (BASAGLAR KWIKPEN) 100 UNIT/ML SOPN Inject 20 Units into the skin daily.  . lansoprazole (PREVACID SOLUTAB) 30 MG disintegrating tablet Take 30 mg by mouth 2 (two) times daily.   . Melatonin 1 MG TABS Take 2 tablets by mouth at bedtime.   . metoprolol tartrate (LOPRESSOR) 25 MG tablet Take 12.5 mg by mouth 2 (two) times daily.   . NON FORMULARY Diet Type:  Dysphagia 3, thin no straws  . Ostomy Supplies (SKIN PREP SPRAY) MISC Apply to bilateral heels every shift for prevention  . Pramoxine-Calamine (AVEENO ANTI-ITCH) 1-3 % LOTN Apply to the extremities and back daily and prn for itching/ dry skin. Do not apply to the buttocks wound  . Probiotic Product (RISA-BID PROBIOTIC) TABS Take 1 tablet by mouth 2 (two) times a day.  . valsartan (DIOVAN) 40 MG tablet Take 40 mg by mouth 2 (two) times daily.    No facility-administered  encounter medications on file as of 10/30/2019.      Review of Systems:  Review of Systems  Reason unable to perform ROS: expressive aphasia     Physical Exam: Vitals:   10/30/19 1609  BP: 137/81  Pulse: 77  Resp: 18  Temp: 97.8 F (36.6 C)  TempSrc: Oral  SpO2: 98%  Weight: 162 lb 12.8 oz (73.8 kg)  Height: 5' 11"  (1.803 m)   Body mass index is 22.71 kg/m. Physical Exam Constitutional:      General: He is not in acute distress.    Appearance: He is well-developed. He is not diaphoretic.  Neck:     Musculoskeletal: Neck supple.     Thyroid: No thyromegaly.  Cardiovascular:     Rate and Rhythm: Normal rate and regular rhythm.     Pulses: Normal pulses.     Heart sounds: Normal heart sounds.  Pulmonary:     Effort: Pulmonary effort is normal. No respiratory distress.     Breath sounds: Normal breath sounds.  Abdominal:     General: Bowel sounds are normal. There is no distension.     Palpations: Abdomen is soft.     Tenderness: There is no abdominal tenderness.  Genitourinary:    Comments: Foley present  Penile tear present Musculoskeletal:     Right lower leg: No edema.     Left lower leg: No edema.     Comments: Right side hemiparesis  Has some movement of right extremities  Able to move left extremities            Lymphadenopathy:     Cervical: No cervical adenopathy.  Skin:    General: Skin is warm and dry.  Neurological:     Mental Status: He is alert. Mental status is at baseline.  Psychiatric:        Mood and Affect: Mood normal.        SIGNIFICANT DIAGNOSTIC EXAMS  LABS REVIEWED PREVIOUS:   01-04-19: urine culture: klebsiella oxytoca  01-16-19: wbc 10.1; hgb 13.9; hct 45.2; mcv 96.2; plt 287; glucose 371; bun 44; creat 1.10; k+ 4.7; na++137; ca 8.7 01-28-19:  wbc 8.1; hgb 14.9; hct 48.4; mcv 98.;4 plt 282; glucose 285; bun 38; creat 1.17; k+ 4.3 na++ 143 ca 9.3  01-29-19: glucose 263; bun 39; creat 1.17;  k+ 4.0; na++ 139; ca 9.0; ast 52; alt 59   albumin 2.7 hgb a1c 7.6; chol 116; ldl 50; trig 236; hdl 19  03-13-19: wbc 9.3 hgb 14.4; hct 45.0; mcv 93.2; plt 270; chol 106; ldl 58 trig 124; hdl 23; urine micro-albumin 2236.0  05-23-19: wbc 7.0; hgb 14.6; hct 47.4; mcv 93.1; plt 290; glucose 202; bun 30; creat 1.51; k+ 4.0; na++ 140; ca 8.7; ast 50 alt 50 alk phos 131; albumin 2.6 . Hgb a1c 7.5  06-06-19: glucose 85; bun 37; creat 1.50; k+ 4.0; na++ 141; ca 8.8; liver normal albumin 2.9  09-06-19: hgb a1c 6.4  NO NEW LABS.     Assessment/Plan 1. Encounter for Medicare annual wellness exam 2. Nontraumatic subcortical hemorrhage of left cerebral hemisphere  3. Urinary retention 4. Hypertension associated with diabetes.  Will get hep c screen Will hemoccult stool  Will continue current plan of care Will continue current medications Will continue to monitor his status.    Ok Edwards NP [facility]  Contact [phone] Monday through Friday 8am- 5pm  After hours call [phone]

## 2019-10-31 ENCOUNTER — Other Ambulatory Visit (HOSPITAL_COMMUNITY)
Admission: RE | Admit: 2019-10-31 | Discharge: 2019-10-31 | Disposition: A | Discharge status: Home / Self Care | Payer: Medicare Other | Source: Skilled Nursing Facility | Attending: Adult Health | Admitting: Adult Health

## 2019-10-31 DIAGNOSIS — I1310 Hypertensive heart and chronic kidney disease without heart failure, with stage 1 through stage 4 chronic kidney disease, or unspecified chronic kidney disease: Secondary | ICD-10-CM | POA: Insufficient documentation

## 2019-10-31 LAB — HEPATITIS C ANTIBODY: HCV Ab: NONREACTIVE

## 2019-10-31 LAB — OCCULT BLOOD X 1 CARD TO LAB, STOOL: Fecal Occult Bld: NEGATIVE

## 2019-11-01 ENCOUNTER — Encounter (HOSPITAL_COMMUNITY)
Admission: RE | Admit: 2019-11-01 | Discharge: 2019-11-01 | Disposition: A | Discharge status: Home / Self Care | Payer: Medicare Other | Source: Skilled Nursing Facility | Attending: Adult Health | Admitting: Adult Health

## 2019-11-01 DIAGNOSIS — Z0000 Encounter for general adult medical examination without abnormal findings: Secondary | ICD-10-CM | POA: Diagnosis not present

## 2019-11-01 LAB — HEPATITIS C ANTIBODY: HCV Ab: NONREACTIVE

## 2019-11-01 LAB — OCCULT BLOOD X 1 CARD TO LAB, STOOL: Fecal Occult Bld: NEGATIVE

## 2019-11-02 ENCOUNTER — Encounter (HOSPITAL_COMMUNITY)
Admission: RE | Admit: 2019-11-02 | Discharge: 2019-11-02 | Disposition: A | Discharge status: Home / Self Care | Payer: Medicare Other | Source: Skilled Nursing Facility | Attending: Adult Health | Admitting: Adult Health

## 2019-11-02 DIAGNOSIS — Z0000 Encounter for general adult medical examination without abnormal findings: Secondary | ICD-10-CM | POA: Diagnosis not present

## 2019-11-02 LAB — OCCULT BLOOD X 1 CARD TO LAB, STOOL: Fecal Occult Bld: NEGATIVE

## 2019-11-06 ENCOUNTER — Non-Acute Institutional Stay (SKILLED_NURSING_FACILITY): Payer: Medicare Other | Admitting: Internal Medicine

## 2019-11-06 ENCOUNTER — Encounter: Payer: Self-pay | Admitting: Internal Medicine

## 2019-11-06 DIAGNOSIS — E785 Hyperlipidemia, unspecified: Secondary | ICD-10-CM

## 2019-11-06 DIAGNOSIS — N183 Chronic kidney disease, stage 3 unspecified: Secondary | ICD-10-CM

## 2019-11-06 DIAGNOSIS — E1169 Type 2 diabetes mellitus with other specified complication: Secondary | ICD-10-CM | POA: Diagnosis not present

## 2019-11-06 DIAGNOSIS — E1122 Type 2 diabetes mellitus with diabetic chronic kidney disease: Secondary | ICD-10-CM

## 2019-11-06 DIAGNOSIS — I739 Peripheral vascular disease, unspecified: Secondary | ICD-10-CM

## 2019-11-06 NOTE — Progress Notes (Signed)
Location:  [facility] Room Number: 130/P Place of Service:  SNF (31)  Hennie Duos, MD  Patient Care Team: Hennie Duos, MD as PCP - General (Internal Medicine) [name], MD as Consulting Physician (Urology) Nyoka Cowden Phylis Bougie, NP as Nurse Practitioner (Geriatric Medicine)  Extended Emergency Contact Information Primary Emergency Contact: Carachure,[name] Address: [address]          [place] [address] [name] of [place] Phone: [phone] Mobile Phone: [phone] Relation: Spouse Secondary Emergency Contact: [name] [place] of [place] Phone: [phone] Relation: Son    Allergies: Patient has no known allergies.  Chief Complaint  Patient presents with  . Medical Management of Chronic Issues    Routine visit of medical management    HPI: Patient is 73 y.o. male who is being seen for routine issues of PVD, CKD 3, and hyperlipidemia.  Past Medical History:  Diagnosis Date  . A-fib ([place])    a. Post-op afib after CABG 08/2012.  [name] Acute gastric ulcer with hemorrhage   . Acute respiratory failure with hypoxia ([place])   . Aphasia following cerebral infarction   . Atrial fibrillation ([place])   . CAD (coronary artery disease)    a. NSTEMI s/p stent to distal RCA 03/2000. b. Inf MI s/p emergent thrombectomy/stenting mid RCA 10/2000. c. NSTEMI s/p CABGx4 (LIMA-LAD, SVG-OM2, seq SVG-acute marginal and PD) [id number] - course complicated by confusion, post-op AF, L pleural effusion with thoracentesis. d. Normal LV function by echo 08/2012.  . Diabetes mellitus   . Diverticulosis   . Dysphagia following cerebral infarction   . Encephalopathy   . Extended spectrum beta lactamase (ESBL) resistance   . Generalized anxiety disorder   . GERD (gastroesophageal reflux disease)   . Hemiplegia and hemiparesis following cerebral infarction affecting right dominant side ([place])   . Hiatal hernia   . HLD (hyperlipidemia)   . HTN  (hypertension)   . Non-ST elevation (NSTEMI) myocardial infarction ([place])   . Nontraumatic intracerebral hemorrhage in hemisphere, subcortical ([place])   . Peripheral vascular disease ([place])    a. Carotid dopplers neg 08/13/12. b. Pre-cabg ABIs - R=0.87 suggesting mild dz, L=1.29 possibly falsely elevated due to calcified vessels.  . Pleural effusion    a. L pleural eff after CABG s/p thoracentesis 08/22/12.  . Prostate cancer [facility])    Status post radiation treatment.  . Prostatic hypertrophy    a. Hx of urinary retention, awaiting TURP.  [name] Severe protein-calorie malnutrition ([place])   . Tobacco abuse   . 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 Valvular heart disease    a. Mild  MR by TEE 08/2012.    Past Surgical History:  Procedure Laterality Date  . APPENDECTOMY    . BACK SURGERY    . CORONARY ARTERY BYPASS GRAFT  08/16/2012   Procedure: CORONARY ARTERY BYPASS GRAFTING (CABG);  Surgeon: Melrose Nakayama, MD;  Location: [facility] [place];  Service: Open Heart Surgery;  Laterality: N/A;  . IR REPLC GASTRO/COLONIC TUBE PERCUT W/FLUORO  02/13/2019  . LEFT HEART CATHETERIZATION WITH CORONARY ANGIOGRAM N/A 08/14/2012   Procedure: LEFT HEART CATHETERIZATION WITH CORONARY ANGIOGRAM;  Surgeon: Peter M Martinique, MD;  Location: [facility] CATH LAB;  Service: Cardiovascular;  Laterality: N/A;    Outpatient Encounter Medications as of 11/06/2019  Medication Sig  . acetaminophen (TYLENOL) 325 MG tablet Take 650 mg by mouth every 4 (four) hours as needed.  [name] amLODipine (NORVASC) 5 MG tablet  Take 5 mg by mouth daily.   [name] atorvastatin (LIPITOR) 40 MG tablet Take 40 mg by mouth at bedtime.   . baclofen (LIORESAL) 10 MG tablet Take 1 tablet by mouth 2 (two) times daily.   Roseanne Kaufman Peru-Castor Oil (VENELEX) OINT Apply topically to sacrum, coccyx and bilateral buttocks every shift and as needed for prevention  . FLUoxetine (PROZAC) 20 MG/5ML solution Take 30 mg by mouth daily.   . hydrocortisone cream  (PREPARATION H) 1 % Apply rectally twice daily and as needed for hemorrhoids  . Insulin Glargine (BASAGLAR KWIKPEN) 100 UNIT/ML SOPN Inject 20 Units into the skin daily.  . insulin lispro (HUMALOG) 100 UNIT/ML cartridge Inject 10 Units into the skin 3 (three) times daily before meals.  . lansoprazole (PREVACID SOLUTAB) 30 MG disintegrating tablet Take 30 mg by mouth. Give before meals  . Melatonin 1 MG TABS Take 2 tablets by mouth at bedtime.   . metoprolol tartrate (LOPRESSOR) 25 MG tablet Take 12.5 mg by mouth 2 (two) times daily.   . NON FORMULARY Diet Type:  Dysphagia 3, thin no straws  . Ostomy Supplies (SKIN PREP SPRAY) MISC Apply to bilateral heels every shift for prevention  . Pramoxine-Calamine (AVEENO ANTI-ITCH) 1-3 % LOTN Apply to the extremities and back daily and prn for itching/ dry skin. Do not apply to the buttocks wound  . Probiotic Product (RISA-BID PROBIOTIC) TABS Take 1 tablet by mouth 2 (two) times a day.  . valsartan (DIOVAN) 40 MG tablet Take 40 mg by mouth 2 (two) times daily.   . [DISCONTINUED] insulin aspart (NOVOLOG FLEXPEN) 100 UNIT/ML FlexPen Inject 10 Units into the skin 3 (three) times daily with meals. Give @ 12:00 pm and 5:00 pm   No facility-administered encounter medications on file as of 11/06/2019.      No orders of the defined types were placed in this encounter.   Immunization History  Administered Date(s) Administered  . Influenza-Unspecified 09/16/2019  . Pneumococcal-Unspecified 09/03/2019  . Td 09/03/2019    Social History   Tobacco Use  . Smoking status: Former Smoker    Quit date: 11/12/1972    Years since quitting: 47.0  . Smokeless tobacco: Never Used  Substance Use Topics  . Alcohol use: No    Review of Systems unable to obtain secondary to expressive aphasia     Vitals:   11/06/19 0920  BP: (!) 153/82  Pulse: (!) 48  Resp: 18  Temp: 97.6 F (36.4 C)  SpO2: 98%   Body mass index is 22.71 kg/m. Physical Exam   GENERAL APPEARANCE: Alert, conversant, No acute distress  SKIN: No diaphoresis rash HEENT: Unremarkable RESPIRATORY: Breathing is even, unlabored. Lung sounds are clear   CARDIOVASCULAR: Heart RRR no murmurs, rubs or gallops. No peripheral edema  GASTROINTESTINAL: Abdomen is soft, non-tender, not distended w/ normal bowel sounds.  GENITOURINARY: Bladder non tender, not distended; Foley to straight drain MUSCULOSKELETAL: No abnormal joints or musculature NEUROLOGIC: Cranial nerves 2-12 grossly intact; right-sided hemiplegia PSYCHIATRIC: Mood and affect unable to obtain secondary to aphasia, no behavioral issues  Patient Active Problem List   Diagnosis Date Noted  . Microalbuminuria due to type 2 diabetes mellitus ([place]) 06/04/2019  . CKD stage 3 due to type 2 diabetes mellitus ([place]) 06/04/2019  . Foley catheter in place 04/25/2019  . Penis injury, sequela 04/25/2019  . Hypertension associated with diabetes ([place]) 01/28/2019  . Dyslipidemia associated with type 2 diabetes mellitus ([place]) 01/28/2019  . Dysphagia, oropharyngeal phase 01/28/2019  .  Type 2 diabetes mellitus with peripheral vascular disease ([place]) 01/28/2019  . Major depression with psychotic features ([place]) 01/28/2019  . Protein-calorie malnutrition, severe 01/08/2019  . Pressure injury of skin 01/05/2019  . History of hemorrhagic cerebrovascular accident (CVA) with residual deficit 01/04/2019  . Urinary retention 12/24/2018  . IVH (intraventricular hemorrhage) ([place]) 11/18/2018  . Nontraumatic subcortical hemorrhage of left cerebral hemisphere ([place]) 11/18/2018  . CAD (coronary artery disease)   . Peripheral vascular disease ([place])   . A-fib One Episode after CABG in 2013   . HTN (hypertension) 08/15/2012    CMP     Component Value Date/Time   NA 141 06/06/2019 0445   K 4.0 06/06/2019 0445   CL 103 06/06/2019 0445   CO2 27 06/06/2019 0445   GLUCOSE 85 06/06/2019 0445   BUN 37 (H) 06/06/2019 0445   CREATININE 1.50 (H)  06/06/2019 0445   CALCIUM 8.8 (L) 06/06/2019 0445   PROT 6.2 (L) 06/06/2019 0445   ALBUMIN 2.9 (L) 06/06/2019 0445   AST 24 06/06/2019 0445   ALT 32 06/06/2019 0445   ALKPHOS 112 06/06/2019 0445   BILITOT 0.8 06/06/2019 0445   GFRNONAA 45 (L) 06/06/2019 0445   GFRAA 52 (L) 06/06/2019 0445   Recent Labs    01/29/19 0700 05/23/19 1037 06/06/19 0445 10/07/19 0730  NA 139 140 141  --   K 4.0 4.0 4.0  --   CL 101 104 103  --   CO2 30 28 27   --   GLUCOSE 263* 202* 85  --   BUN 39* 30* 37*  --   CREATININE 1.17 1.51* 1.50*  --   CALCIUM 9.0 8.7* 8.8*  --   MG  --   --   --  2.0   Recent Labs    01/29/19 0700 05/23/19 1037 06/06/19 0445  AST 52* 50* 24  ALT 59* 49* 32  ALKPHOS 121 131* 112  BILITOT 0.6 1.0 0.8  PROT 6.0* 6.1* 6.2*  ALBUMIN 2.7* 2.6* 2.9*   Recent Labs    12/24/18 0850  01/05/19 0715  01/16/19 0700 01/28/19 0730 03/13/19 0700 05/23/19 1037  WBC 7.4   < > 6.6   < > 10.1 8.1 9.3 7.0  NEUTROABS 5.0  --  5.1  --  7.4  --   --   --   HGB 13.7   < > 13.8   < > 13.9 14.9 14.4 14.6  HCT 43.7   < > 44.3   < > 45.2 48.4 45.0 47.4  MCV 94.6   < > 95.7   < > 96.2 98.4 93.2 93.1  PLT 271   < > 291   < > 287 282 270 290   < > = values in this interval not displayed.   Recent Labs    01/29/19 0700 03/13/19 0700 10/07/19 0730  CHOL 116 106 102  LDLCALC 50 58 63  TRIG 236* 124 81   Lab Results  Component Value Date   MICROALBUR 2,236.0 (H) 03/13/2019   No results found for: TSH Lab Results  Component Value Date   HGBA1C 6.4 (H) 09/06/2019   Lab Results  Component Value Date   CHOL 102 10/07/2019   HDL 23 (L) 10/07/2019   LDLCALC 63 10/07/2019   TRIG 81 10/07/2019   CHOLHDL 4.4 10/07/2019    Significant Diagnostic Results in last 30 days:  No results found.  Assessment and Plan  Peripheral vascular disease No reported problems; continue statin, Lipitor  40 mg daily  CKD stage 3 due to type 2 diabetes mellitus ([place]) Most recent creatinine  1.5, most recent GFR 45 which is stable from prior; will follow at intervals  Dyslipidemia associated with type 2 diabetes mellitus (HCC) LDL 63, good control; continue Lipitor 40 mg daily    Hennie Duos, MD

## 2019-11-08 ENCOUNTER — Encounter: Payer: Self-pay | Admitting: Internal Medicine

## 2019-11-08 NOTE — Assessment & Plan Note (Signed)
No reported problems; continue statin, Lipitor 40 mg daily

## 2019-11-08 NOTE — Assessment & Plan Note (Signed)
Most recent creatinine 1.5, most recent GFR 45 which is stable from prior; will follow at intervals

## 2019-11-08 NOTE — Assessment & Plan Note (Signed)
LDL 63, good control; continue Lipitor 40 mg daily

## 2019-11-17 ENCOUNTER — Other Ambulatory Visit (HOSPITAL_COMMUNITY)
Admission: RE | Admit: 2019-11-17 | Discharge: 2019-11-17 | Disposition: A | Discharge status: Home / Self Care | Payer: Medicare Other | Source: Ambulatory Visit | Attending: Internal Medicine | Admitting: Internal Medicine

## 2019-11-17 ENCOUNTER — Other Ambulatory Visit: Payer: Self-pay | Admitting: Internal Medicine

## 2019-11-17 DIAGNOSIS — Z20828 Contact with and (suspected) exposure to other viral communicable diseases: Secondary | ICD-10-CM | POA: Diagnosis present

## 2019-11-17 DIAGNOSIS — Z9189 Other specified personal risk factors, not elsewhere classified: Secondary | ICD-10-CM

## 2019-11-20 LAB — SARS CORONAVIRUS 2 (TAT 6-24 HRS): SARS Coronavirus 2: NEGATIVE

## 2019-11-23 ENCOUNTER — Other Ambulatory Visit: Payer: Self-pay | Admitting: Internal Medicine

## 2019-11-23 ENCOUNTER — Other Ambulatory Visit (HOSPITAL_COMMUNITY)
Admission: RE | Admit: 2019-11-23 | Discharge: 2019-11-23 | Disposition: A | Discharge status: Home / Self Care | Payer: Medicare Other | Source: Ambulatory Visit | Attending: Internal Medicine | Admitting: Internal Medicine

## 2019-11-23 DIAGNOSIS — Z20828 Contact with and (suspected) exposure to other viral communicable diseases: Secondary | ICD-10-CM | POA: Diagnosis present

## 2019-11-23 DIAGNOSIS — Z9189 Other specified personal risk factors, not elsewhere classified: Secondary | ICD-10-CM

## 2019-11-23 LAB — SARS CORONAVIRUS 2 (TAT 6-24 HRS): SARS Coronavirus 2: NEGATIVE

## 2019-11-27 ENCOUNTER — Other Ambulatory Visit (HOSPITAL_COMMUNITY)
Admission: RE | Admit: 2019-11-27 | Discharge: 2019-11-27 | Disposition: A | Discharge status: Home / Self Care | Payer: Medicare Other | Source: Ambulatory Visit | Attending: Internal Medicine | Admitting: Internal Medicine

## 2019-11-27 DIAGNOSIS — Z20828 Contact with and (suspected) exposure to other viral communicable diseases: Secondary | ICD-10-CM | POA: Diagnosis present

## 2019-11-28 LAB — NOVEL CORONAVIRUS, NAA (HOSP ORDER, SEND-OUT TO REF LAB; TAT 18-24 HRS): SARS-CoV-2, NAA: NOT DETECTED

## 2019-12-02 ENCOUNTER — Other Ambulatory Visit (HOSPITAL_COMMUNITY)
Admission: RE | Admit: 2019-12-02 | Discharge: 2019-12-02 | Disposition: A | Discharge status: Home / Self Care | Payer: Medicare Other | Source: Ambulatory Visit | Attending: Internal Medicine | Admitting: Internal Medicine

## 2019-12-02 DIAGNOSIS — Z20828 Contact with and (suspected) exposure to other viral communicable diseases: Secondary | ICD-10-CM | POA: Insufficient documentation

## 2019-12-03 ENCOUNTER — Non-Acute Institutional Stay (SKILLED_NURSING_FACILITY): Payer: Medicare Other | Admitting: Adult Health

## 2019-12-03 DIAGNOSIS — E1122 Type 2 diabetes mellitus with diabetic chronic kidney disease: Secondary | ICD-10-CM | POA: Diagnosis not present

## 2019-12-03 DIAGNOSIS — E1159 Type 2 diabetes mellitus with other circulatory complications: Secondary | ICD-10-CM

## 2019-12-03 DIAGNOSIS — I10 Essential (primary) hypertension: Secondary | ICD-10-CM

## 2019-12-03 DIAGNOSIS — E1151 Type 2 diabetes mellitus with diabetic peripheral angiopathy without gangrene: Secondary | ICD-10-CM | POA: Diagnosis not present

## 2019-12-03 DIAGNOSIS — N183 Chronic kidney disease, stage 3 unspecified: Secondary | ICD-10-CM

## 2019-12-03 DIAGNOSIS — I152 Hypertension secondary to endocrine disorders: Secondary | ICD-10-CM

## 2019-12-04 ENCOUNTER — Encounter: Payer: Self-pay | Admitting: Adult Health

## 2019-12-04 LAB — NOVEL CORONAVIRUS, NAA (HOSP ORDER, SEND-OUT TO REF LAB; TAT 18-24 HRS): SARS-CoV-2, NAA: NOT DETECTED

## 2019-12-04 NOTE — Progress Notes (Signed)
Location:    [facility] Room Number: 130/P Place of Service:  SNF (31)   CODE STATUS: Full Code  No Known Allergies  Chief Complaint  Patient presents with  . Acute Visit     COVID Vaccine Permission    HPI:  We have the mederna vaccine. His risk factors include: age; long term resident of snf; diabetes; hypertension; ckd stage 3.  I have spoken with his family regarding the 2 injection process; effectiveness; side effects and possible adverse reactions. They have verbalized understanding and have agreed to the vaccine. There are no reports of changes in appetite; no uncontrolled pain;no cough or shortness of breath.   Past Medical History:  Diagnosis Date  . A-fib ([place])    a. Post-op afib after CABG 08/2012.  [name] Acute gastric ulcer with hemorrhage   . Acute respiratory failure with hypoxia ([place])   . Aphasia following cerebral infarction   . Atrial fibrillation ([place])   . CAD (coronary artery disease)    a. NSTEMI s/p stent to distal RCA 03/2000. b. Inf MI s/p emergent thrombectomy/stenting mid RCA 10/2000. c. NSTEMI s/p CABGx4 (LIMA-LAD, SVG-OM2, seq SVG-acute marginal and PD) 3/0/07 - course complicated by confusion, post-op AF, L pleural effusion with thoracentesis. d. Normal LV function by echo 08/2012.  . Diabetes mellitus   . Diverticulosis   . Dysphagia following cerebral infarction   . Encephalopathy   . Extended spectrum beta lactamase (ESBL) resistance   . Generalized anxiety disorder   . GERD (gastroesophageal reflux disease)   . Hemiplegia and hemiparesis following cerebral infarction affecting right dominant side ([place])   . Hiatal hernia   . HLD (hyperlipidemia)   . HTN (hypertension)   . Non-ST elevation (NSTEMI) myocardial infarction ([place])   . Nontraumatic intracerebral hemorrhage in hemisphere, subcortical ([place])   . Peripheral vascular disease ([place])    a. Carotid dopplers neg 08/13/12. b. Pre-cabg ABIs - R=0.87 suggesting mild dz, L=1.29  possibly falsely elevated due to calcified vessels.  . Pleural effusion    a. L pleural eff after CABG s/p thoracentesis 08/22/12.  . Prostate cancer [facility])    Status post radiation treatment.  . Prostatic hypertrophy    a. Hx of urinary retention, awaiting TURP.  [name] Severe protein-calorie malnutrition ([place])   . Tobacco abuse   . 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 Valvular heart disease    a. Mild  MR by TEE 08/2012.    Past Surgical History:  Procedure Laterality Date  . APPENDECTOMY    . BACK SURGERY    . CORONARY ARTERY BYPASS GRAFT  08/16/2012   Procedure: CORONARY ARTERY BYPASS GRAFTING (CABG);  Surgeon: Melrose Nakayama, MD;  Location: [facility];  Service: Open Heart Surgery;  Laterality: N/A;  . IR REPLC GASTRO/COLONIC TUBE PERCUT W/FLUORO  02/13/2019  . LEFT HEART CATHETERIZATION WITH CORONARY ANGIOGRAM N/A 08/14/2012   Procedure: LEFT HEART CATHETERIZATION WITH CORONARY ANGIOGRAM;  Surgeon: Peter M Martinique, MD;  Location: [facility] CATH LAB;  Service: Cardiovascular;  Laterality: N/A;    Social History   Socioeconomic History  . Marital status: Married    Spouse name: Not on file  . Number of children: Not on file  . Years of education: Not on file  . Highest education level: Not on file  Occupational History  . Occupation: retired   Tobacco Use  . Smoking status: Former Smoker    Quit date: 11/12/1972    Years since quitting:  47.0  . Smokeless tobacco: Never Used  Substance and Sexual Activity  . Alcohol use: No  . Drug use: No  . Sexual activity: Not Currently  Other Topics Concern  . Not on file  Social History Narrative   He is a long term patient of [facility]    Social Determinants of Health   Financial Resource Strain: Low Risk   . Difficulty of Paying Living Expenses: Not hard at all  Food Insecurity: Unknown  . Worried About Charity fundraiser in the Last Year: Patient refused  . Ran Out of Food in the Last Year: Patient refused  Transportation  Needs: Unknown  . Lack of Transportation (Medical): Patient refused  . Lack of Transportation (Non-Medical): Patient refused  Physical Activity: Inactive  . Days of Exercise per Week: 0 days  . Minutes of Exercise per Session: 0 min  Stress: Unknown  . Feeling of Stress : Patient refused  Social Connections: Unknown  . Frequency of Communication with Friends and Family: Patient refused  . Frequency of Social Gatherings with Friends and Family: Patient refused  . Attends Religious Services: Patient refused  . Active Member of Clubs or Organizations: Patient refused  . Attends Archivist Meetings: Patient refused  . Marital Status: Patient refused  Intimate Partner Violence: Unknown  . Fear of Current or Ex-Partner: Patient refused  . Emotionally Abused: Patient refused  . Physically Abused: Patient refused  . Sexually Abused: Patient refused   Family History  Problem Relation Age of Onset  . Hypertension Mother       VITAL SIGNS BP (!) 159/84   Pulse (!) 56   Temp (!) 96.7 F (35.9 C) (Oral)   Resp 20   Ht '5\' 11"'$  (1.803 m)   Wt 160 lb 6.4 oz (72.8 kg)   SpO2 98%   BMI 22.37 kg/m   Outpatient Encounter Medications as of 12/03/2019  Medication Sig  . acetaminophen (TYLENOL) 325 MG tablet Take 650 mg by mouth every 4 (four) hours as needed.  [name] amLODipine (NORVASC) 5 MG tablet Take 5 mg by mouth daily.   [name] ascorbic acid (VITAMIN C) 500 MG tablet Take 500 mg by mouth daily.  [name] atorvastatin (LIPITOR) 40 MG tablet Take 40 mg by mouth at bedtime.   . baclofen (LIORESAL) 10 MG tablet Take 1 tablet by mouth 2 (two) times daily.   [name] FLUoxetine (PROZAC) 20 MG/5ML solution Take 30 mg by mouth daily.   . hydrocortisone cream (PREPARATION H) 1 % Apply rectally twice daily and as needed for hemorrhoids  . Insulin Glargine (BASAGLAR KWIKPEN) 100 UNIT/ML SOPN Inject 20 Units into the skin daily.  . insulin lispro (HUMALOG) 100 UNIT/ML cartridge Inject 10 Units into the skin 3  (three) times daily before meals.  . lansoprazole (PREVACID SOLUTAB) 30 MG disintegrating tablet Take 30 mg by mouth. Give before meals  . Melatonin 1 MG TABS Take 2 tablets by mouth at bedtime.   . metoprolol tartrate (LOPRESSOR) 25 MG tablet Take 12.5 mg by mouth 2 (two) times daily.   . NON FORMULARY Diet Type:  Dysphagia 3, thin no straws  . Ostomy Supplies (SKIN PREP SPRAY) MISC Apply to bilateral heels every shift for prevention  . Pramoxine-Calamine (AVEENO ANTI-ITCH) 1-3 % LOTN Apply to the extremities and back daily and prn for itching/ dry skin. Do not apply to the buttocks wound  . Probiotic Product (RISA-BID PROBIOTIC) TABS Take 1 tablet by mouth 2 (two) times a day.  [name]  valsartan (DIOVAN) 40 MG tablet Take 40 mg by mouth 2 (two) times daily.   [name] zinc sulfate 220 (50 Zn) MG capsule Take 220 mg by mouth daily.  . [DISCONTINUED] Balsam [place]-Castor Oil (VENELEX) OINT Apply topically to sacrum, coccyx and bilateral buttocks every shift and as needed for prevention   No facility-administered encounter medications on file as of 12/03/2019.     SIGNIFICANT DIAGNOSTIC EXAMS  LABS REVIEWED PREVIOUS:   01-04-19: urine culture: klebsiella oxytoca  01-16-19: wbc 10.1; hgb 13.9; hct 45.2; mcv 96.2; plt 287; glucose 371; bun 44; creat 1.10; k+ 4.7; na++137; ca 8.7 01-28-19: wbc 8.1; hgb 14.9; hct 48.4; mcv 98.;4 plt 282; glucose 285; bun 38; creat 1.17; k+ 4.3 na++ 143 ca 9.3  01-29-19: glucose 263; bun 39; creat 1.17;  k+ 4.0; na++ 139; ca 9.0; ast 52; alt 59  albumin 2.7 hgb a1c 7.6; chol 116; ldl 50; trig 236; hdl 19  03-13-19: wbc 9.3 hgb 14.4; hct 45.0; mcv 93.2; plt 270; chol 106; ldl 58 trig 124; hdl 23; urine micro-albumin 2236.0  05-23-19: wbc 7.0; hgb 14.6; hct 47.4; mcv 93.1; plt 290; glucose 202; bun 30; creat 1.51; k+ 4.0; na++ 140; ca 8.7; ast 50 alt 50 alk phos 131; albumin 2.6 . Hgb a1c 7.5  06-06-19: glucose 85; bun 37; creat 1.50; k+ 4.0; na++ 141; ca 8.8; liver normal albumin 2.9   09-06-19: hgb a1c 6.4  NO NEW LABS.   Review of Systems  Reason unable to perform ROS: expressive aphasia     Physical Exam Constitutional:      General: He is not in acute distress.    Appearance: He is well-developed. He is not diaphoretic.  Neck:     Thyroid: No thyromegaly.  Cardiovascular:     Rate and Rhythm: Normal rate and regular rhythm.     Pulses: Normal pulses.     Heart sounds: Normal heart sounds.  Pulmonary:     Effort: Pulmonary effort is normal. No respiratory distress.     Breath sounds: Normal breath sounds.  Abdominal:     General: Bowel sounds are normal. There is no distension.     Palpations: Abdomen is soft.     Tenderness: There is no abdominal tenderness.  Genitourinary:    Comments:  Foley present  Penile tear present Musculoskeletal:     Cervical back: Neck supple.     Right lower leg: No edema.     Left lower leg: No edema.     Comments: Right side hemiparesis  Has some movement of right extremities  Able to move left extremities             Lymphadenopathy:     Cervical: No cervical adenopathy.  Skin:    General: Skin is warm and dry.  Neurological:     Mental Status: He is alert. Mental status is at baseline.  Psychiatric:        Mood and Affect: Mood normal.       ASSESSMENT/ PLAN:  TODAY   1. Long term resident of snf 2. Type 2 diabetes mellitus with peripheral vascular disease 3. Hypertension associated with type 2 diabetes mellitus 4. ckd stage 3 due to diabetes mellitus type 2   Will setup for the first injection on 12-10-19.     MD is aware of resident's narcotic use and is in agreement with current plan of care. We will attempt to wean resident as appropriate.  Ok Edwards NP [facility]  Contact [phone] Monday through Friday 8am- 5pm  After hours call [phone]

## 2019-12-08 ENCOUNTER — Other Ambulatory Visit (HOSPITAL_COMMUNITY)
Admission: RE | Admit: 2019-12-08 | Discharge: 2019-12-08 | Disposition: A | Discharge status: Home / Self Care | Payer: Medicare Other | Source: Ambulatory Visit | Attending: Adult Health | Admitting: Adult Health

## 2019-12-08 DIAGNOSIS — Z20828 Contact with and (suspected) exposure to other viral communicable diseases: Secondary | ICD-10-CM | POA: Diagnosis present

## 2019-12-09 ENCOUNTER — Non-Acute Institutional Stay (SKILLED_NURSING_FACILITY): Payer: Medicare Other | Admitting: Adult Health

## 2019-12-09 ENCOUNTER — Encounter: Payer: Self-pay | Admitting: Adult Health

## 2019-12-09 DIAGNOSIS — R809 Proteinuria, unspecified: Secondary | ICD-10-CM

## 2019-12-09 DIAGNOSIS — E1122 Type 2 diabetes mellitus with diabetic chronic kidney disease: Secondary | ICD-10-CM | POA: Diagnosis not present

## 2019-12-09 DIAGNOSIS — N183 Chronic kidney disease, stage 3 unspecified: Secondary | ICD-10-CM

## 2019-12-09 DIAGNOSIS — E1129 Type 2 diabetes mellitus with other diabetic kidney complication: Secondary | ICD-10-CM

## 2019-12-09 DIAGNOSIS — F323 Major depressive disorder, single episode, severe with psychotic features: Secondary | ICD-10-CM | POA: Diagnosis not present

## 2019-12-09 LAB — NOVEL CORONAVIRUS, NAA (HOSP ORDER, SEND-OUT TO REF LAB; TAT 18-24 HRS): SARS-CoV-2, NAA: NOT DETECTED

## 2019-12-09 NOTE — Progress Notes (Signed)
Location:    [facility] Room Number: 130/P Place of Service:  SNF (31)   CODE STATUS: Full Code  No Known Allergies  Chief Complaint  Patient presents with  . Medical Management of Chronic Issues       Microalbuminuria due to type 2 diabetes mellitus:  Major depression with psychotic features;  CKD stage 3 due to type 2 diabetes mellitus:     HPI:  He is a 73 year old long term resident of this facilty being seen for the management of her chronic illnesses; microalbuminuria depression ckd. There are no reports of anxiety or agitation; no reports of changes in appetite; no reports of uncontrolled pain.    Past Medical History:  Diagnosis Date  . A-fib ([place])    a. Post-op afib after CABG 08/2012.  [name] Acute gastric ulcer with hemorrhage   . Acute respiratory failure with hypoxia ([place])   . Aphasia following cerebral infarction   . Atrial fibrillation ([place])   . CAD (coronary artery disease)    a. NSTEMI s/p stent to distal RCA 03/2000. b. Inf MI s/p emergent thrombectomy/stenting mid RCA 10/2000. c. NSTEMI s/p CABGx4 (LIMA-LAD, SVG-OM2, seq SVG-acute marginal and PD) 7/0/26 - course complicated by confusion, post-op AF, L pleural effusion with thoracentesis. d. Normal LV function by echo 08/2012.  . Diabetes mellitus   . Diverticulosis   . Dysphagia following cerebral infarction   . Encephalopathy   . Extended spectrum beta lactamase (ESBL) resistance   . Generalized anxiety disorder   . GERD (gastroesophageal reflux disease)   . Hemiplegia and hemiparesis following cerebral infarction affecting right dominant side ([place])   . Hiatal hernia   . HLD (hyperlipidemia)   . HTN (hypertension)   . Non-ST elevation (NSTEMI) myocardial infarction ([place])   . Nontraumatic intracerebral hemorrhage in hemisphere, subcortical ([place])   . Peripheral vascular disease ([place])    a. Carotid dopplers neg 08/13/12. b. Pre-cabg ABIs - R=0.87 suggesting mild dz, L=1.29 possibly falsely  elevated due to calcified vessels.  . Pleural effusion    a. L pleural eff after CABG s/p thoracentesis 08/22/12.  . Prostate cancer [facility])    Status post radiation treatment.  . Prostatic hypertrophy    a. Hx of urinary retention, awaiting TURP.  [name] Severe protein-calorie malnutrition ([place])   . Tobacco abuse   . 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 Valvular heart disease    a. Mild  MR by TEE 08/2012.    Past Surgical History:  Procedure Laterality Date  . APPENDECTOMY    . BACK SURGERY    . CORONARY ARTERY BYPASS GRAFT  08/16/2012   Procedure: CORONARY ARTERY BYPASS GRAFTING (CABG);  Surgeon: Melrose Nakayama, MD;  Location: [address];  Service: Open Heart Surgery;  Laterality: N/A;  . IR REPLC GASTRO/COLONIC TUBE PERCUT W/FLUORO  02/13/2019  . LEFT HEART CATHETERIZATION WITH CORONARY ANGIOGRAM N/A 08/14/2012   Procedure: LEFT HEART CATHETERIZATION WITH CORONARY ANGIOGRAM;  Surgeon: Peter M Martinique, MD;  Location: [facility] CATH LAB;  Service: Cardiovascular;  Laterality: N/A;    Social History   Socioeconomic History  . Marital status: Married    Spouse name: Not on file  . Number of children: Not on file  . Years of education: Not on file  . Highest education level: Not on file  Occupational History  . Occupation: retired   Tobacco Use  . Smoking status: Former Smoker    Quit date: 11/12/1972  Years since quitting: 47.1  . Smokeless tobacco: Never Used  Substance and Sexual Activity  . Alcohol use: No  . Drug use: No  . Sexual activity: Not Currently  Other Topics Concern  . Not on file  Social History Narrative   He is a long term patient of [facility]    Social Determinants of Health   Financial Resource Strain: Low Risk   . Difficulty of Paying Living Expenses: Not hard at all  Food Insecurity: Unknown  . Worried About Charity fundraiser in the Last Year: Patient refused  . Ran Out of Food in the Last Year: Patient refused  Transportation Needs: Unknown   . Lack of Transportation (Medical): Patient refused  . Lack of Transportation (Non-Medical): Patient refused  Physical Activity: Inactive  . Days of Exercise per Week: 0 days  . Minutes of Exercise per Session: 0 min  Stress: Unknown  . Feeling of Stress : Patient refused  Social Connections: Unknown  . Frequency of Communication with Friends and Family: Patient refused  . Frequency of Social Gatherings with Friends and Family: Patient refused  . Attends Religious Services: Patient refused  . Active Member of Clubs or Organizations: Patient refused  . Attends Archivist Meetings: Patient refused  . Marital Status: Patient refused  Intimate Partner Violence: Unknown  . Fear of Current or Ex-Partner: Patient refused  . Emotionally Abused: Patient refused  . Physically Abused: Patient refused  . Sexually Abused: Patient refused   Family History  Problem Relation Age of Onset  . Hypertension Mother       VITAL SIGNS BP (!) 136/58   Pulse 89   Temp (!) 97 F (36.1 C) (Oral)   Resp 20   Ht 5' 11"  (1.803 m)   Wt 160 lb 6.4 oz (72.8 kg)   SpO2 98%   BMI 22.37 kg/m   Outpatient Encounter Medications as of 12/09/2019  Medication Sig  . acetaminophen (TYLENOL) 325 MG tablet Take 650 mg by mouth every 4 (four) hours as needed.  [name] amLODipine (NORVASC) 5 MG tablet Take 5 mg by mouth daily.   [name] atorvastatin (LIPITOR) 40 MG tablet Take 40 mg by mouth at bedtime.   . baclofen (LIORESAL) 10 MG tablet Take 1 tablet by mouth 2 (two) times daily.   [name] FLUoxetine (PROZAC) 20 MG/5ML solution Take 30 mg by mouth daily.   . hydrocortisone cream (PREPARATION H) 1 % Apply rectally twice daily and as needed for hemorrhoids  . Insulin Glargine (BASAGLAR KWIKPEN) 100 UNIT/ML SOPN Inject 20 Units into the skin daily.  . insulin lispro (HUMALOG) 100 UNIT/ML cartridge Inject 10 Units into the skin 3 (three) times daily before meals.  . lansoprazole (PREVACID SOLUTAB) 30 MG disintegrating  tablet Take 30 mg by mouth. Give before meals  . Melatonin 1 MG TABS Take 2 tablets by mouth at bedtime.   . metoprolol tartrate (LOPRESSOR) 25 MG tablet Take 12.5 mg by mouth 2 (two) times daily.   . NON FORMULARY Diet Type:  Dysphagia 3, thin no straws  . Ostomy Supplies (SKIN PREP SPRAY) MISC Apply to bilateral heels every shift for prevention  . Pramoxine-Calamine (AVEENO ANTI-ITCH) 1-3 % LOTN Apply to the extremities and back daily and prn for itching/ dry skin. Do not apply to the buttocks wound  . Probiotic Product (RISA-BID PROBIOTIC) TABS Take 1 tablet by mouth 2 (two) times a day.  . valsartan (DIOVAN) 40 MG tablet Take 40 mg by mouth 2 (  two) times daily.    No facility-administered encounter medications on file as of 12/09/2019.     SIGNIFICANT DIAGNOSTIC EXAMS  LABS REVIEWED PREVIOUS:   01-04-19: urine culture: klebsiella oxytoca  01-16-19: wbc 10.1; hgb 13.9; hct 45.2; mcv 96.2; plt 287; glucose 371; bun 44; creat 1.10; k+ 4.7; na++137; ca 8.7 01-28-19: wbc 8.1; hgb 14.9; hct 48.4; mcv 98.;4 plt 282; glucose 285; bun 38; creat 1.17; k+ 4.3 na++ 143 ca 9.3  01-29-19: glucose 263; bun 39; creat 1.17;  k+ 4.0; na++ 139; ca 9.0; ast 52; alt 59  albumin 2.7 hgb a1c 7.6; chol 116; ldl 50; trig 236; hdl 19  03-13-19: wbc 9.3 hgb 14.4; hct 45.0; mcv 93.2; plt 270; chol 106; ldl 58 trig 124; hdl 23; urine micro-albumin 2236.0  05-23-19: wbc 7.0; hgb 14.6; hct 47.4; mcv 93.1; plt 290; glucose 202; bun 30; creat 1.51; k+ 4.0; na++ 140; ca 8.7; ast 50 alt 50 alk phos 131; albumin 2.6 . Hgb a1c 7.5  06-06-19: glucose 85; bun 37; creat 1.50; k+ 4.0; na++ 141; ca 8.8; liver normal albumin 2.9  09-06-19: hgb a1c 6.4  TODAY  10-07-19: chol 102;ldl 63; trig 81; hdl 23; mag 20 PSA 0.36 10-31-19: hemoccult: neg; hep C neg 11-01-19: hemoccult: neg 11-02-19: hemoccult: neg    Review of Systems  Reason unable to perform ROS: expressive aphasia     Physical Exam Constitutional:      General: He is  not in acute distress.    Appearance: He is well-developed. He is not diaphoretic.  Neck:     Thyroid: No thyromegaly.  Cardiovascular:     Rate and Rhythm: Normal rate and regular rhythm.     Pulses: Normal pulses.     Heart sounds: Normal heart sounds.  Pulmonary:     Effort: Pulmonary effort is normal. No respiratory distress.     Breath sounds: Normal breath sounds.  Abdominal:     General: Bowel sounds are normal. There is no distension.     Palpations: Abdomen is soft.     Tenderness: There is no abdominal tenderness.  Genitourinary:    Comments: Foley present  Penile tear present Musculoskeletal:     Cervical back: Neck supple.     Right lower leg: No edema.     Left lower leg: No edema.     Comments: Right side hemiparesis  Has some movement of right extremities  Able to move left extremities              Lymphadenopathy:     Cervical: No cervical adenopathy.  Skin:    General: Skin is warm and dry.  Neurological:     Mental Status: He is alert. Mental status is at baseline.  Psychiatric:        Mood and Affect: Mood normal.        ASSESSMENT/ PLAN:  TODAY:   1. Microalbuminuria due to type 2 diabetes mellitus: is without change is 2236; is on ARB will monitor   2. Major depression with psychotic features; is stable will continue prozac 30 mg daily is off antipsychotics   3. CKD stage 3 due to type 2 diabetes mellitus: is stable bun 37; creat 1.50 will monitor    PREVIOUS:   4. Dysphagia oropharyngeal phase: is stable no signs of aspiration is now on thin liquids will monitor   5. Chronic urine retention: without change requires long term foley is followed by urology.   6. Type 2 diabetes  mellitus with peripheral vascular disease: hgb a1c 6.4 will continue lantus 26 units nightly and  novolog 10 units with meals.    7. Protein calorie malnutrition severe: is without change weight is stable at 160 pounds; albumin 2.9 will continue to monitor   8.  Nontraumatic subcortical hemorrhage of left cerebral hemisphere/ehomorragic cerebrovascular accident: is stable will continue baclofen 10 mg twice daily for spasticity.   9. CAD: native artery; native heart without angina: is status post CABG 2013; will monitor is on status; lopressor  10. Hypertension associated with diabetes: is stable b/p 136/58 will continue norvasc 5 mg daily and lopressor 12.5 mg twice daily diovan 40 mg twice daily   11. Dyslipidemia associated with type 2 diabetes mellitus; is stable LDL 58 will continue lipitor 40 mg daily       MD is aware of resident's narcotic use and is in agreement with current plan of care. We will attempt to wean resident as appropriate.  Ok Edwards NP [facility]  Contact [phone] Monday through Friday 8am- 5pm  After hours call [phone]

## 2019-12-12 ENCOUNTER — Non-Acute Institutional Stay (SKILLED_NURSING_FACILITY): Payer: Medicare Other | Admitting: Adult Health

## 2019-12-12 ENCOUNTER — Encounter: Payer: Self-pay | Admitting: Adult Health

## 2019-12-12 DIAGNOSIS — F323 Major depressive disorder, single episode, severe with psychotic features: Secondary | ICD-10-CM | POA: Diagnosis not present

## 2019-12-12 NOTE — Progress Notes (Addendum)
Location:    [facility] Room Number: 130/P Place of Service:  SNF (31)   CODE STATUS: Full Code  No Known Allergies  Chief Complaint  Patient presents with  . Acute Visit    Med Management    HPI:  He is presently taking prozac 30 mg daily for his depression. He has been off antipsychotics for several months. He will take off the leg support for his foley at times. There are no reports of agitation; no anxiety no insomnia. It is felt at this time changing his medication could cause him harm.    Past Medical History:  Diagnosis Date  . A-fib ([place])    a. Post-op afib after CABG 08/2012.  [name] Acute gastric ulcer with hemorrhage   . Acute respiratory failure with hypoxia ([place])   . Aphasia following cerebral infarction   . Atrial fibrillation ([place])   . CAD (coronary artery disease)    a. NSTEMI s/p stent to distal RCA 03/2000. b. Inf MI s/p emergent thrombectomy/stenting mid RCA 10/2000. c. NSTEMI s/p CABGx4 (LIMA-LAD, SVG-OM2, seq SVG-acute marginal and PD) 08/14/78 - course complicated by confusion, post-op AF, L pleural effusion with thoracentesis. d. Normal LV function by echo 08/2012.  . Diabetes mellitus   . Diverticulosis   . Dysphagia following cerebral infarction   . Encephalopathy   . Extended spectrum beta lactamase (ESBL) resistance   . Generalized anxiety disorder   . GERD (gastroesophageal reflux disease)   . Hemiplegia and hemiparesis following cerebral infarction affecting right dominant side ([place])   . Hiatal hernia   . HLD (hyperlipidemia)   . HTN (hypertension)   . Non-ST elevation (NSTEMI) myocardial infarction ([place])   . Nontraumatic intracerebral hemorrhage in hemisphere, subcortical ([place])   . Peripheral vascular disease ([place])    a. Carotid dopplers neg 08/13/12. b. Pre-cabg ABIs - R=0.87 suggesting mild dz, L=1.29 possibly falsely elevated due to calcified vessels.  . Pleural effusion    a. L pleural eff after CABG s/p thoracentesis  08/22/12.  . Prostate cancer [facility])    Status post radiation treatment.  . Prostatic hypertrophy    a. Hx of urinary retention, awaiting TURP.  [name] Severe protein-calorie malnutrition ([place])   . Tobacco abuse   . 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 Valvular heart disease    a. Mild  MR by TEE 08/2012.    Past Surgical History:  Procedure Laterality Date  . APPENDECTOMY    . BACK SURGERY    . CORONARY ARTERY BYPASS GRAFT  08/16/2012   Procedure: CORONARY ARTERY BYPASS GRAFTING (CABG);  Surgeon: Melrose Nakayama, MD;  Location: [facility];  Service: Open Heart Surgery;  Laterality: N/A;  . IR REPLC GASTRO/COLONIC TUBE PERCUT W/FLUORO  02/13/2019  . LEFT HEART CATHETERIZATION WITH CORONARY ANGIOGRAM N/A 08/14/2012   Procedure: LEFT HEART CATHETERIZATION WITH CORONARY ANGIOGRAM;  Surgeon: Peter M Martinique, MD;  Location: [facility] CATH LAB;  Service: Cardiovascular;  Laterality: N/A;    Social History   Socioeconomic History  . Marital status: Married    Spouse name: Not on file  . Number of children: Not on file  . Years of education: Not on file  . Highest education level: Not on file  Occupational History  . Occupation: retired   Tobacco Use  . Smoking status: Former Smoker    Quit date: 11/12/1972    Years since quitting: 47.1  . Smokeless tobacco: Never Used  Substance and Sexual Activity  .  Alcohol use: No  . Drug use: No  . Sexual activity: Not Currently  Other Topics Concern  . Not on file  Social History Narrative   He is a long term patient of [facility]    Social Determinants of Health   Financial Resource Strain: Low Risk   . Difficulty of Paying Living Expenses: Not hard at all  Food Insecurity: Unknown  . Worried About Charity fundraiser in the Last Year: Patient refused  . Ran Out of Food in the Last Year: Patient refused  Transportation Needs: Unknown  . Lack of Transportation (Medical): Patient refused  . Lack of Transportation (Non-Medical): Patient  refused  Physical Activity: Inactive  . Days of Exercise per Week: 0 days  . Minutes of Exercise per Session: 0 min  Stress: Unknown  . Feeling of Stress : Patient refused  Social Connections: Unknown  . Frequency of Communication with Friends and Family: Patient refused  . Frequency of Social Gatherings with Friends and Family: Patient refused  . Attends Religious Services: Patient refused  . Active Member of Clubs or Organizations: Patient refused  . Attends Archivist Meetings: Patient refused  . Marital Status: Patient refused  Intimate Partner Violence: Unknown  . Fear of Current or Ex-Partner: Patient refused  . Emotionally Abused: Patient refused  . Physically Abused: Patient refused  . Sexually Abused: Patient refused   Family History  Problem Relation Age of Onset  . Hypertension Mother       VITAL SIGNS BP (!) 122/59   Pulse 65   Temp 97.8 F (36.6 C) (Oral)   Resp 20   Ht 5' 11"  (1.803 m)   Wt 160 lb 6.4 oz (72.8 kg)   SpO2 98%   BMI 22.37 kg/m   Outpatient Encounter Medications as of 12/12/2019  Medication Sig  . acetaminophen (TYLENOL) 325 MG tablet Take 650 mg by mouth every 4 (four) hours as needed.  [name] amLODipine (NORVASC) 5 MG tablet Take 5 mg by mouth daily.   [name] atorvastatin (LIPITOR) 40 MG tablet Take 40 mg by mouth at bedtime.   . baclofen (LIORESAL) 10 MG tablet Take 1 tablet by mouth 2 (two) times daily.   [name] FLUoxetine (PROZAC) 20 MG/5ML solution Take 30 mg by mouth daily.   . hydrocortisone cream (PREPARATION H) 1 % Apply rectally twice daily and as needed for hemorrhoids  . Insulin Glargine (BASAGLAR KWIKPEN) 100 UNIT/ML SOPN Inject 20 Units into the skin daily.  . insulin lispro (HUMALOG) 100 UNIT/ML cartridge Inject 10 Units into the skin 3 (three) times daily before meals.  . lansoprazole (PREVACID SOLUTAB) 30 MG disintegrating tablet Take 30 mg by mouth. Give before meals  . Melatonin 1 MG TABS Take 2 tablets by mouth at bedtime.    . metoprolol tartrate (LOPRESSOR) 25 MG tablet Take 12.5 mg by mouth 2 (two) times daily.   . NON FORMULARY Diet Type:  Dysphagia 3, thin no straws  . Ostomy Supplies (SKIN PREP SPRAY) MISC Apply to bilateral heels every shift for prevention  . Pramoxine-Calamine (AVEENO ANTI-ITCH) 1-3 % LOTN Apply to the extremities and back daily and prn for itching/ dry skin. Do not apply to the buttocks wound  . Probiotic Product (RISA-BID PROBIOTIC) TABS Take 1 tablet by mouth 2 (two) times a day.  . valsartan (DIOVAN) 40 MG tablet Take 40 mg by mouth 2 (two) times daily.    No facility-administered encounter medications on file as of 12/12/2019.  SIGNIFICANT DIAGNOSTIC EXAMS   LABS REVIEWED PREVIOUS:   01-04-19: urine culture: klebsiella oxytoca  01-16-19: wbc 10.1; hgb 13.9; hct 45.2; mcv 96.2; plt 287; glucose 371; bun 44; creat 1.10; k+ 4.7; na++137; ca 8.7 01-28-19: wbc 8.1; hgb 14.9; hct 48.4; mcv 98.;4 plt 282; glucose 285; bun 38; creat 1.17; k+ 4.3 na++ 143 ca 9.3  01-29-19: glucose 263; bun 39; creat 1.17;  k+ 4.0; na++ 139; ca 9.0; ast 52; alt 59  albumin 2.7 hgb a1c 7.6; chol 116; ldl 50; trig 236; hdl 19  03-13-19: wbc 9.3 hgb 14.4; hct 45.0; mcv 93.2; plt 270; chol 106; ldl 58 trig 124; hdl 23; urine micro-albumin 2236.0  05-23-19: wbc 7.0; hgb 14.6; hct 47.4; mcv 93.1; plt 290; glucose 202; bun 30; creat 1.51; k+ 4.0; na++ 140; ca 8.7; ast 50 alt 50 alk phos 131; albumin 2.6 . Hgb a1c 7.5  06-06-19: glucose 85; bun 37; creat 1.50; k+ 4.0; na++ 141; ca 8.8; liver normal albumin 2.9  09-06-19: hgb a1c 6.4 10-07-19: chol 102;ldl 63; trig 81; hdl 23; mag 20 PSA 0.36 10-31-19: hemoccult: neg; hep C neg 11-01-19: hemoccult: neg 11-02-19: hemoccult: neg   NO NEW LABS.   Review of Systems  Reason unable to perform ROS: expressive aphasia     Physical Exam Constitutional:      General: He is not in acute distress.    Appearance: He is well-developed. He is not diaphoretic.  Neck:      Thyroid: No thyromegaly.  Cardiovascular:     Rate and Rhythm: Normal rate and regular rhythm.     Pulses: Normal pulses.     Heart sounds: Normal heart sounds.  Pulmonary:     Effort: Pulmonary effort is normal. No respiratory distress.     Breath sounds: Normal breath sounds.  Abdominal:     General: Bowel sounds are normal. There is no distension.     Palpations: Abdomen is soft.     Tenderness: There is no abdominal tenderness.  Genitourinary:    Comments:  Foley present  Penile tear present Musculoskeletal:     Cervical back: Neck supple.     Right lower leg: No edema.     Left lower leg: No edema.     Comments: Right side hemiparesis  Has some movement of right extremities  Able to move left extremities  Lymphadenopathy:     Cervical: No cervical adenopathy.  Skin:    General: Skin is warm and dry.  Neurological:     Mental Status: He is alert. Mental status is at baseline.  Psychiatric:        Mood and Affect: Mood normal.       ASSESSMENT/ PLAN:  TODAY  1.  Major depression with psychotic features: is stable will continue prozac  30 mg daily and will monitor his status.   MD is aware of resident's narcotic use and is in agreement with current plan of care. We will attempt to wean resident as appropriate.  Ok Edwards NP [facility]  Contact [phone] Monday through Friday 8am- 5pm  After hours call [phone]

## 2019-12-25 ENCOUNTER — Encounter: Payer: Self-pay | Admitting: Adult Health

## 2019-12-25 ENCOUNTER — Non-Acute Institutional Stay (SKILLED_NURSING_FACILITY): Payer: Medicare PPO | Admitting: Adult Health

## 2019-12-25 DIAGNOSIS — E1159 Type 2 diabetes mellitus with other circulatory complications: Secondary | ICD-10-CM | POA: Diagnosis not present

## 2019-12-25 DIAGNOSIS — I615 Nontraumatic intracerebral hemorrhage, intraventricular: Secondary | ICD-10-CM

## 2019-12-25 DIAGNOSIS — I10 Essential (primary) hypertension: Secondary | ICD-10-CM

## 2019-12-25 DIAGNOSIS — N183 Chronic kidney disease, stage 3 unspecified: Secondary | ICD-10-CM

## 2019-12-25 DIAGNOSIS — E1122 Type 2 diabetes mellitus with diabetic chronic kidney disease: Secondary | ICD-10-CM

## 2019-12-25 DIAGNOSIS — I152 Hypertension secondary to endocrine disorders: Secondary | ICD-10-CM

## 2019-12-25 NOTE — Progress Notes (Signed)
Location:    [place] Room Number: 858I Place of Service:  SNF (31) Phillips Grout NP   CODE STATUS: FULL  No Known Allergies  Chief Complaint  Patient presents with  . Acute Visit    Care Plan Meeting    HPI:  We have come together for his care plan meeting. No BIMS; mood 2/30. His weight is stable no falls since Sept 2020. There are no reports of agitation; no changes in appetite; no uncontrolled pain. He continues to be followed for his chronic illnesses including: hypertension; ivh; ckd.   Past Medical History:  Diagnosis Date  . A-fib ([place])    a. Post-op afib after CABG 08/2012.  [name] Acute gastric ulcer with hemorrhage   . Acute respiratory failure with hypoxia ([place])   . Aphasia following cerebral infarction   . Atrial fibrillation ([place])   . CAD (coronary artery disease)    a. NSTEMI s/p stent to distal RCA 03/2000. b. Inf MI s/p emergent thrombectomy/stenting mid RCA 10/2000. c. NSTEMI s/p CABGx4 (LIMA-LAD, SVG-OM2, seq SVG-acute marginal and PD) 5/0/27 - course complicated by confusion, post-op AF, L pleural effusion with thoracentesis. d. Normal LV function by echo 08/2012.  . Diabetes mellitus   . Diverticulosis   . Dysphagia following cerebral infarction   . Encephalopathy   . Extended spectrum beta lactamase (ESBL) resistance   . Generalized anxiety disorder   . GERD (gastroesophageal reflux disease)   . Hemiplegia and hemiparesis following cerebral infarction affecting right dominant side ([place])   . Hiatal hernia   . HLD (hyperlipidemia)   . HTN (hypertension)   . Non-ST elevation (NSTEMI) myocardial infarction ([place])   . Nontraumatic intracerebral hemorrhage in hemisphere, subcortical ([place])   . Peripheral vascular disease ([place])    a. Carotid dopplers neg 08/13/12. b. Pre-cabg ABIs - R=0.87 suggesting mild dz, L=1.29 possibly falsely elevated due to calcified vessels.  . Pleural effusion    a. L pleural eff after CABG s/p thoracentesis  08/22/12.  . Prostate cancer [facility])    Status post radiation treatment.  . Prostatic hypertrophy    a. Hx of urinary retention, awaiting TURP.  [name] Severe protein-calorie malnutrition ([place])   . Tobacco abuse   . 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 Valvular heart disease    a. Mild  MR by TEE 08/2012.    Past Surgical History:  Procedure Laterality Date  . APPENDECTOMY    . BACK SURGERY    . CORONARY ARTERY BYPASS GRAFT  08/16/2012   Procedure: CORONARY ARTERY BYPASS GRAFTING (CABG);  Surgeon: Melrose Nakayama, MD;  Location: [place];  Service: Open Heart Surgery;  Laterality: N/A;  . IR REPLC GASTRO/COLONIC TUBE PERCUT W/FLUORO  02/13/2019  . LEFT HEART CATHETERIZATION WITH CORONARY ANGIOGRAM N/A 08/14/2012   Procedure: LEFT HEART CATHETERIZATION WITH CORONARY ANGIOGRAM;  Surgeon: Peter M Martinique, MD;  Location: [facility] CATH LAB;  Service: Cardiovascular;  Laterality: N/A;    Social History   Socioeconomic History  . Marital status: Married    Spouse name: Not on file  . Number of children: Not on file  . Years of education: Not on file  . Highest education level: Not on file  Occupational History  . Occupation: retired   Tobacco Use  . Smoking status: Former Smoker    Quit date: 11/12/1972    Years since quitting: 47.1  . Smokeless tobacco: Never Used  Substance and Sexual Activity  . Alcohol  use: No  . Drug use: No  . Sexual activity: Not Currently  Other Topics Concern  . Not on file  Social History Narrative   He is a long term patient of [facility]    Social Determinants of Health   Financial Resource Strain: Low Risk   . Difficulty of Paying Living Expenses: Not hard at all  Food Insecurity: Unknown  . Worried About Charity fundraiser in the Last Year: Patient refused  . Ran Out of Food in the Last Year: Patient refused  Transportation Needs: Unknown  . Lack of Transportation (Medical): Patient refused  . Lack of Transportation (Non-Medical): Patient  refused  Physical Activity: Inactive  . Days of Exercise per Week: 0 days  . Minutes of Exercise per Session: 0 min  Stress: Unknown  . Feeling of Stress : Patient refused  Social Connections: Unknown  . Frequency of Communication with Friends and Family: Patient refused  . Frequency of Social Gatherings with Friends and Family: Patient refused  . Attends Religious Services: Patient refused  . Active Member of Clubs or Organizations: Patient refused  . Attends Archivist Meetings: Patient refused  . Marital Status: Patient refused  Intimate Partner Violence: Unknown  . Fear of Current or Ex-Partner: Patient refused  . Emotionally Abused: Patient refused  . Physically Abused: Patient refused  . Sexually Abused: Patient refused   Family History  Problem Relation Age of Onset  . Hypertension Mother       VITAL SIGNS BP (!) 146/82   Pulse (!) 56   Temp 98.4 F (36.9 C) (Oral)   Resp 18   Ht 5' 11"  (1.803 m)   Wt 163 lb 6.4 oz (74.1 kg)   SpO2 98%   BMI 22.79 kg/m   Outpatient Encounter Medications as of 12/25/2019  Medication Sig  . acetaminophen (TYLENOL) 325 MG tablet Take 650 mg by mouth every 4 (four) hours as needed. per standing order for pain/fever DO NOT EXCEED >3,000 mg in 24 hours  . amLODipine (NORVASC) 5 MG tablet Take 5 mg by mouth daily.   [name] atorvastatin (LIPITOR) 40 MG tablet Take 40 mg by mouth at bedtime.   . baclofen (LIORESAL) 10 MG tablet Take 1 tablet by mouth 2 (two) times daily. Hold for sedation  . FLUoxetine (PROZAC) 20 MG/5ML solution Take 30 mg by mouth daily.   . Glucerna (GLUCERNA) LIQD Take 237 mLs by mouth daily.  . hydrocortisone cream (PREPARATION H) 1 % Apply rectally twice daily and as needed for hemorrhoids  . Insulin Glargine (BASAGLAR KWIKPEN) 100 UNIT/ML SOPN Inject 20 Units into the skin daily.  . insulin lispro (HUMALOG) 100 UNIT/ML cartridge Inject 10 Units into the skin 3 (three) times daily before meals. Give 10 units SQ  with meals if Blood Sugar >150  . lansoprazole (PREVACID SOLUTAB) 30 MG disintegrating tablet Take 30 mg by mouth. Give before meals  . Melatonin 1 MG TABS Take 2 tablets by mouth at bedtime.   . metoprolol tartrate (LOPRESSOR) 25 MG tablet Take 12.5 mg by mouth 2 (two) times daily.   . NON FORMULARY Diet Type:  Dysphagia 3, thin no straws  . Ostomy Supplies (SKIN PREP SPRAY) MISC Apply to bilateral heels every shift for prevention  . Pramoxine-Calamine (AVEENO ANTI-ITCH) 1-3 % LOTN Apply to the extremities and back daily and prn for itching/ dry skin. Do not apply to the buttocks wound  . valsartan (DIOVAN) 40 MG tablet Take 40 mg by mouth  2 (two) times daily.   . [DISCONTINUED] Probiotic Product (RISA-BID PROBIOTIC) TABS Take 1 tablet by mouth 2 (two) times a day.   No facility-administered encounter medications on file as of 12/25/2019.     SIGNIFICANT DIAGNOSTIC EXAMS   LABS REVIEWED PREVIOUS:   01-04-19: urine culture: klebsiella oxytoca  01-16-19: wbc 10.1; hgb 13.9; hct 45.2; mcv 96.2; plt 287; glucose 371; bun 44; creat 1.10; k+ 4.7; na++137; ca 8.7 01-28-19: wbc 8.1; hgb 14.9; hct 48.4; mcv 98.;4 plt 282; glucose 285; bun 38; creat 1.17; k+ 4.3 na++ 143 ca 9.3  01-29-19: glucose 263; bun 39; creat 1.17;  k+ 4.0; na++ 139; ca 9.0; ast 52; alt 59  albumin 2.7 hgb a1c 7.6; chol 116; ldl 50; trig 236; hdl 19  03-13-19: wbc 9.3 hgb 14.4; hct 45.0; mcv 93.2; plt 270; chol 106; ldl 58 trig 124; hdl 23; urine micro-albumin 2236.0  05-23-19: wbc 7.0; hgb 14.6; hct 47.4; mcv 93.1; plt 290; glucose 202; bun 30; creat 1.51; k+ 4.0; na++ 140; ca 8.7; ast 50 alt 50 alk phos 131; albumin 2.6 . Hgb a1c 7.5  06-06-19: glucose 85; bun 37; creat 1.50; k+ 4.0; na++ 141; ca 8.8; liver normal albumin 2.9  09-06-19: hgb a1c 6.4 10-07-19: chol 102;ldl 63; trig 81; hdl 23; mag 20 PSA 0.36 10-31-19: hemoccult: neg; hep C neg 11-01-19: hemoccult: neg 11-02-19: hemoccult: neg   NO NEW LABS.   Review of Systems   Reason unable to perform ROS: expressive aphasia    Physical Exam Constitutional:      General: He is not in acute distress.    Appearance: He is well-developed. He is not diaphoretic.  Neck:     Thyroid: No thyromegaly.  Cardiovascular:     Rate and Rhythm: Normal rate and regular rhythm.     Pulses: Normal pulses.     Heart sounds: Normal heart sounds.  Pulmonary:     Effort: Pulmonary effort is normal. No respiratory distress.     Breath sounds: Normal breath sounds.  Abdominal:     General: Bowel sounds are normal. There is no distension.     Palpations: Abdomen is soft.     Tenderness: There is no abdominal tenderness.  Genitourinary:    Comments:  Foley present  Penile tear present Musculoskeletal:     Cervical back: Neck supple.     Right lower leg: No edema.     Left lower leg: No edema.     Comments: Right side hemiparesis  Has some movement of right extremities  Able to move left extremities   Lymphadenopathy:     Cervical: No cervical adenopathy.  Skin:    General: Skin is warm and dry.  Neurological:     Mental Status: He is alert. Mental status is at baseline.  Psychiatric:        Mood and Affect: Mood normal.       ASSESSMENT/ PLAN:  TODAY  1. Hypertension associated with diabetes 2. IVH(intraventricular hemorrhage)  3. CKD stage 2 due to diabetes mellitus type 2  Will continue current medications Will continue current plan of care Will continue to monitor his status.     MD is aware of resident's narcotic use and is in agreement with current plan of care. We will attempt to wean resident as appropriate.  Ok Edwards NP [facility]  Contact [phone] Monday through Friday 8am- 5pm  After hours call [phone]

## 2020-01-13 ENCOUNTER — Non-Acute Institutional Stay (SKILLED_NURSING_FACILITY): Payer: Medicare PPO | Admitting: Adult Health

## 2020-01-13 ENCOUNTER — Encounter: Payer: Self-pay | Admitting: Adult Health

## 2020-01-13 DIAGNOSIS — R339 Retention of urine, unspecified: Secondary | ICD-10-CM

## 2020-01-13 DIAGNOSIS — E43 Unspecified severe protein-calorie malnutrition: Secondary | ICD-10-CM

## 2020-01-13 DIAGNOSIS — Z978 Presence of other specified devices: Secondary | ICD-10-CM | POA: Diagnosis not present

## 2020-01-13 DIAGNOSIS — E1151 Type 2 diabetes mellitus with diabetic peripheral angiopathy without gangrene: Secondary | ICD-10-CM | POA: Diagnosis not present

## 2020-01-13 NOTE — Progress Notes (Signed)
Location:    [place] Room Number: 208Y Place of Service:  SNF (31) Phillips Grout NP    CODE STATUS: FULL CODE   No Known Allergies  Chief Complaint  Patient presents with  . Medical Management of Chronic Issues        Chronic urine retention:  Type 2 diabetes mellitus with peripheral vascular disease:   Protein calorie malnutrition severe:    HPI:  He is a 74 year old long term resident of this facility being seen for the management of his chronic illnesses: urine retention; diabetes; malnutrition. There are no reports of changes in appetite; no reports of agitation; no reports of uncontrolled pain.   Past Medical History:  Diagnosis Date  . A-fib ([place])    a. Post-op afib after CABG 08/2012.  [name] Acute gastric ulcer with hemorrhage   . Acute respiratory failure with hypoxia ([place])   . Aphasia following cerebral infarction   . Atrial fibrillation ([place])   . CAD (coronary artery disease)    a. NSTEMI s/p stent to distal RCA 03/2000. b. Inf MI s/p emergent thrombectomy/stenting mid RCA 10/2000. c. NSTEMI s/p CABGx4 (LIMA-LAD, SVG-OM2, seq SVG-acute marginal and PD) 01/14/32 - course complicated by confusion, post-op AF, L pleural effusion with thoracentesis. d. Normal LV function by echo 08/2012.  . Diabetes mellitus   . Diverticulosis   . Dysphagia following cerebral infarction   . Encephalopathy   . Extended spectrum beta lactamase (ESBL) resistance   . Generalized anxiety disorder   . GERD (gastroesophageal reflux disease)   . Hemiplegia and hemiparesis following cerebral infarction affecting right dominant side ([place])   . Hiatal hernia   . HLD (hyperlipidemia)   . HTN (hypertension)   . Non-ST elevation (NSTEMI) myocardial infarction ([place])   . Nontraumatic intracerebral hemorrhage in hemisphere, subcortical ([place])   . Peripheral vascular disease ([place])    a. Carotid dopplers neg 08/13/12. b. Pre-cabg ABIs - R=0.87 suggesting mild dz, L=1.29 possibly falsely  elevated due to calcified vessels.  . Pleural effusion    a. L pleural eff after CABG s/p thoracentesis 08/22/12.  . Prostate cancer [facility])    Status post radiation treatment.  . Prostatic hypertrophy    a. Hx of urinary retention, awaiting TURP.  [name] Severe protein-calorie malnutrition ([place])   . Tobacco abuse   . 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 Valvular heart disease    a. Mild  MR by TEE 08/2012.    Past Surgical History:  Procedure Laterality Date  . APPENDECTOMY    . BACK SURGERY    . CORONARY ARTERY BYPASS GRAFT  08/16/2012   Procedure: CORONARY ARTERY BYPASS GRAFTING (CABG);  Surgeon: Melrose Nakayama, MD;  Location: [place];  Service: Open Heart Surgery;  Laterality: N/A;  . IR REPLC GASTRO/COLONIC TUBE PERCUT W/FLUORO  02/13/2019  . LEFT HEART CATHETERIZATION WITH CORONARY ANGIOGRAM N/A 08/14/2012   Procedure: LEFT HEART CATHETERIZATION WITH CORONARY ANGIOGRAM;  Surgeon: Peter M Martinique, MD;  Location: [facility] CATH LAB;  Service: Cardiovascular;  Laterality: N/A;    Social History   Socioeconomic History  . Marital status: Married    Spouse name: Not on file  . Number of children: Not on file  . Years of education: Not on file  . Highest education level: Not on file  Occupational History  . Occupation: retired   Tobacco Use  . Smoking status: Former Smoker    Quit date: 11/12/1972  Years since quitting: 91.2  . Smokeless tobacco: Never Used  Substance and Sexual Activity  . Alcohol use: No  . Drug use: No  . Sexual activity: Not Currently  Other Topics Concern  . Not on file  Social History Narrative   He is a long term patient of [facility]    Social Determinants of Health   Financial Resource Strain: Low Risk   . Difficulty of Paying Living Expenses: Not hard at all  Food Insecurity: Unknown  . Worried About Charity fundraiser in the Last Year: Patient refused  . Ran Out of Food in the Last Year: Patient refused  Transportation Needs: Unknown   . Lack of Transportation (Medical): Patient refused  . Lack of Transportation (Non-Medical): Patient refused  Physical Activity: Inactive  . Days of Exercise per Week: 0 days  . Minutes of Exercise per Session: 0 min  Stress: Unknown  . Feeling of Stress : Patient refused  Social Connections: Unknown  . Frequency of Communication with Friends and Family: Patient refused  . Frequency of Social Gatherings with Friends and Family: Patient refused  . Attends Religious Services: Patient refused  . Active Member of Clubs or Organizations: Patient refused  . Attends Archivist Meetings: Patient refused  . Marital Status: Patient refused  Intimate Partner Violence: Unknown  . Fear of Current or Ex-Partner: Patient refused  . Emotionally Abused: Patient refused  . Physically Abused: Patient refused  . Sexually Abused: Patient refused   Family History  Problem Relation Age of Onset  . Hypertension Mother       VITAL SIGNS BP 137/77   Pulse (!) 49   Temp 98.6 F (37 C) (Oral)   Resp 20   Ht 5' 11"  (1.803 m)   Wt 163 lb 6.4 oz (74.1 kg)   SpO2 98%   BMI 22.79 kg/m   Outpatient Encounter Medications as of 01/13/2020  Medication Sig  . acetaminophen (TYLENOL) 325 MG tablet Take 650 mg by mouth every 4 (four) hours as needed. per standing order for pain/fever DO NOT EXCEED >3,000 mg in 24 hours  . amLODipine (NORVASC) 5 MG tablet Take 5 mg by mouth daily.   [name] atorvastatin (LIPITOR) 40 MG tablet Take 40 mg by mouth at bedtime.   . baclofen (LIORESAL) 10 MG tablet Take 1 tablet by mouth 2 (two) times daily. Hold for sedation  . FLUoxetine (PROZAC) 20 MG/5ML solution Take 30 mg by mouth daily.   . hydrocortisone cream (PREPARATION H) 1 % Apply rectally twice daily and as needed for hemorrhoids  . Insulin Glargine (BASAGLAR KWIKPEN) 100 UNIT/ML SOPN Inject 20 Units into the skin daily.  . insulin lispro (HUMALOG) 100 UNIT/ML cartridge Inject 10 Units into the skin 3 (three)  times daily before meals. Give 10 units SQ with meals if Blood Sugar >150  . lansoprazole (PREVACID SOLUTAB) 30 MG disintegrating tablet Take 30 mg by mouth. Give before meals  . Melatonin 1 MG TABS Take 2 tablets by mouth at bedtime.   . metoprolol tartrate (LOPRESSOR) 25 MG tablet Take 12.5 mg by mouth 2 (two) times daily.   . NON FORMULARY Diet Type:  Dysphagia 3, thin no straws  . Pramoxine-Calamine (AVEENO ANTI-ITCH) 1-3 % LOTN Apply to the extremities and back daily and prn for itching/ dry skin. Do not apply to the buttocks wound  . valsartan (DIOVAN) 40 MG tablet Take 40 mg by mouth 2 (two) times daily.   Barrie Folk ([place]) LIQD  Take 237 mLs by mouth daily.  . [DISCONTINUED] Ostomy Supplies (SKIN PREP SPRAY) MISC Apply to bilateral heels every shift for prevention   No facility-administered encounter medications on file as of 01/13/2020.     SIGNIFICANT DIAGNOSTIC EXAMS   LABS REVIEWED PREVIOUS:   01-04-19: urine culture: klebsiella oxytoca  01-16-19: wbc 10.1; hgb 13.9; hct 45.2; mcv 96.2; plt 287; glucose 371; bun 44; creat 1.10; k+ 4.7; na++137; ca 8.7 01-28-19: wbc 8.1; hgb 14.9; hct 48.4; mcv 98.;4 plt 282; glucose 285; bun 38; creat 1.17; k+ 4.3 na++ 143 ca 9.3  01-29-19: glucose 263; bun 39; creat 1.17;  k+ 4.0; na++ 139; ca 9.0; ast 52; alt 59  albumin 2.7 hgb a1c 7.6; chol 116; ldl 50; trig 236; hdl 19  03-13-19: wbc 9.3 hgb 14.4; hct 45.0; mcv 93.2; plt 270; chol 106; ldl 58 trig 124; hdl 23; urine micro-albumin 2236.0  05-23-19: wbc 7.0; hgb 14.6; hct 47.4; mcv 93.1; plt 290; glucose 202; bun 30; creat 1.51; k+ 4.0; na++ 140; ca 8.7; ast 50 alt 50 alk phos 131; albumin 2.6 . Hgb a1c 7.5  06-06-19: glucose 85; bun 37; creat 1.50; k+ 4.0; na++ 141; ca 8.8; liver normal albumin 2.9  09-06-19: hgb a1c 6.4 10-07-19: chol 102;ldl 63; trig 81; hdl 23; mag 20 PSA 0.36 10-31-19: hemoccult: neg; hep C neg 11-01-19: hemoccult: neg 11-02-19: hemoccult: neg   NO NEW LABS.    Review of  Systems  Reason unable to perform ROS: expressive aphasia    Physical Exam Constitutional:      General: He is not in acute distress.    Appearance: He is well-developed. He is not diaphoretic.  Neck:     Thyroid: No thyromegaly.  Cardiovascular:     Rate and Rhythm: Normal rate and regular rhythm.     Pulses: Normal pulses.     Heart sounds: Normal heart sounds.  Pulmonary:     Effort: Pulmonary effort is normal. No respiratory distress.     Breath sounds: Normal breath sounds.  Abdominal:     General: Bowel sounds are normal. There is no distension.     Palpations: Abdomen is soft.     Tenderness: There is no abdominal tenderness.  Genitourinary:    Comments: Foley present  Penile tear present Musculoskeletal:     Cervical back: Neck supple.     Right lower leg: No edema.     Left lower leg: No edema.     Comments: Right side hemiparesis   Is able to move extremities   Lymphadenopathy:     Cervical: No cervical adenopathy.  Skin:    General: Skin is warm and dry.  Neurological:     Mental Status: He is alert. Mental status is at baseline.  Psychiatric:        Mood and Affect: Mood normal.     ASSESSMENT/ PLAN:  TODAY:   1. Chronic urine retention: is without change has long term foley followed by urology  2. Type 2 diabetes mellitus with peripheral vascular disease: hgb a1c 6.4 will continue lantus 25 units nightly and novolog 10 units with meals.   3. Protein calorie malnutrition severe: is stable weight is 163 pounds albumin 2.9 will monitor   PREVIOUS:   4. Dysphagia oropharyngeal phase: is stable no signs of aspiration is now on thin liquids will monitor   5. Nontraumatic subcortical hemorrhage of left cerebral hemisphere/ehomorragic cerebrovascular accident: is stable will continue baclofen 10 mg twice daily for spasticity.  6. CAD: native artery; native heart without angina: is status post CABG 2013; will monitor is on status; lopressor  7.  Hypertension associated with diabetes: is stable b/p 137/77 will continue norvasc 5 mg daily and lopressor 12.5 mg twice daily diovan 40 mg twice daily   8. Dyslipidemia associated with type 2 diabetes mellitus; is stable LDL 58 will continue lipitor 40 mg daily   9. Microalbuminuria due to type 2 diabetes mellitus: is without change is 2236; is on ARB will monitor   10. Major depression with psychotic features; is stable will continue prozac 30 mg daily is off antipsychotics   11. CKD stage 3 due to type 2 diabetes mellitus: is stable bun 37; creat 1.50 will monitor    Will check cbc cmp hgb a1c      MD is aware of resident's narcotic use and is in agreement with current plan of care. We will attempt to wean resident as appropriate.  Ok Edwards NP [facility]  Contact [phone] Monday through Friday 8am- 5pm  After hours call [phone]

## 2020-01-15 ENCOUNTER — Other Ambulatory Visit (HOSPITAL_COMMUNITY)
Admission: RE | Admit: 2020-01-15 | Discharge: 2020-01-15 | Disposition: A | Discharge status: Home / Self Care | Payer: Medicare PPO | Source: Ambulatory Visit | Attending: Adult Health | Admitting: Adult Health

## 2020-01-15 DIAGNOSIS — N189 Chronic kidney disease, unspecified: Secondary | ICD-10-CM | POA: Diagnosis not present

## 2020-01-15 DIAGNOSIS — I1310 Hypertensive heart and chronic kidney disease without heart failure, with stage 1 through stage 4 chronic kidney disease, or unspecified chronic kidney disease: Secondary | ICD-10-CM | POA: Diagnosis present

## 2020-01-15 LAB — CBC
HCT: 51.4 % (ref 39.0–52.0)
Hemoglobin: 16.5 g/dL (ref 13.0–17.0)
MCH: 29.9 pg (ref 26.0–34.0)
MCHC: 32.1 g/dL (ref 30.0–36.0)
MCV: 93.1 fL (ref 80.0–100.0)
Platelets: 188 10*3/uL (ref 150–400)
RBC: 5.52 MIL/uL (ref 4.22–5.81)
RDW: 13.1 % (ref 11.5–15.5)
WBC: 7.7 10*3/uL (ref 4.0–10.5)
nRBC: 0 % (ref 0.0–0.2)

## 2020-01-15 LAB — COMPREHENSIVE METABOLIC PANEL
ALT: 27 U/L (ref 0–44)
AST: 22 U/L (ref 15–41)
Albumin: 3.1 g/dL — ABNORMAL LOW (ref 3.5–5.0)
Alkaline Phosphatase: 117 U/L (ref 38–126)
Anion gap: 9 (ref 5–15)
BUN: 32 mg/dL — ABNORMAL HIGH (ref 8–23)
CO2: 30 mmol/L (ref 22–32)
Calcium: 8.9 mg/dL (ref 8.9–10.3)
Chloride: 103 mmol/L (ref 98–111)
Creatinine, Ser: 1.49 mg/dL — ABNORMAL HIGH (ref 0.61–1.24)
GFR calc Af Amer: 52 mL/min — ABNORMAL LOW (ref >60)
GFR calc non Af Amer: 45 mL/min — ABNORMAL LOW (ref >60)
Glucose, Bld: 99 mg/dL (ref 70–99)
Potassium: 4.2 mmol/L (ref 3.5–5.1)
Sodium: 142 mmol/L (ref 135–145)
Total Bilirubin: 1.3 mg/dL — ABNORMAL HIGH (ref 0.3–1.2)
Total Protein: 6.4 g/dL — ABNORMAL LOW (ref 6.5–8.1)

## 2020-01-15 LAB — HEMOGLOBIN A1C
Hgb A1c MFr Bld: 6.5 % — ABNORMAL HIGH (ref 4.8–5.6)
Mean Plasma Glucose: 139.85 mg/dL

## 2020-02-11 ENCOUNTER — Non-Acute Institutional Stay (SKILLED_NURSING_FACILITY): Payer: Medicare PPO | Admitting: Adult Health

## 2020-02-11 ENCOUNTER — Encounter: Payer: Self-pay | Admitting: Adult Health

## 2020-02-11 DIAGNOSIS — I2510 Atherosclerotic heart disease of native coronary artery without angina pectoris: Secondary | ICD-10-CM | POA: Diagnosis not present

## 2020-02-11 DIAGNOSIS — I615 Nontraumatic intracerebral hemorrhage, intraventricular: Secondary | ICD-10-CM | POA: Diagnosis not present

## 2020-02-11 DIAGNOSIS — I610 Nontraumatic intracerebral hemorrhage in hemisphere, subcortical: Secondary | ICD-10-CM | POA: Diagnosis not present

## 2020-02-11 DIAGNOSIS — E1159 Type 2 diabetes mellitus with other circulatory complications: Secondary | ICD-10-CM

## 2020-02-11 DIAGNOSIS — I152 Hypertension secondary to endocrine disorders: Secondary | ICD-10-CM

## 2020-02-11 DIAGNOSIS — I10 Essential (primary) hypertension: Secondary | ICD-10-CM | POA: Diagnosis not present

## 2020-02-11 NOTE — Progress Notes (Signed)
Location:    [facility] Room Number: 130/P Place of Service:  SNF (31)   CODE STATUS: Full Code  No Known Allergies  Chief Complaint  Patient presents with  . Medical Management of Chronic Issues        Nontraumatic subcortical hemorrhage of left cerebral hemisphere/ hemorrhagic cerebrovascular accident:  CAD native artery native heart without angina:hypertension associated with diabetes mellitus:     HPI:  He is a 74 year old long term resident of this facility being seen for the management of his chronic illnesses: cva; cad; hypertension. There are no reports of agitation; no reports of changes in appetite; no reports of uncontrolled pain.   Past Medical History:  Diagnosis Date  . A-fib ([place])    a. Post-op afib after CABG 08/2012.  [name] Acute gastric ulcer with hemorrhage   . Acute respiratory failure with hypoxia ([place])   . Aphasia following cerebral infarction   . Atrial fibrillation ([place])   . CAD (coronary artery disease)    a. NSTEMI s/p stent to distal RCA 03/2000. b. Inf MI s/p emergent thrombectomy/stenting mid RCA 10/2000. c. NSTEMI s/p CABGx4 (LIMA-LAD, SVG-OM2, seq SVG-acute marginal and PD) 05/16/98 - course complicated by confusion, post-op AF, L pleural effusion with thoracentesis. d. Normal LV function by echo 08/2012.  . Diabetes mellitus   . Diverticulosis   . Dysphagia following cerebral infarction   . Encephalopathy   . Extended spectrum beta lactamase (ESBL) resistance   . Generalized anxiety disorder   . GERD (gastroesophageal reflux disease)   . Hemiplegia and hemiparesis following cerebral infarction affecting right dominant side ([place])   . Hiatal hernia   . HLD (hyperlipidemia)   . HTN (hypertension)   . Non-ST elevation (NSTEMI) myocardial infarction ([place])   . Nontraumatic intracerebral hemorrhage in hemisphere, subcortical ([place])   . Peripheral vascular disease ([place])    a. Carotid dopplers neg 08/13/12. b. Pre-cabg ABIs - R=0.87  suggesting mild dz, L=1.29 possibly falsely elevated due to calcified vessels.  . Pleural effusion    a. L pleural eff after CABG s/p thoracentesis 08/22/12.  . Prostate cancer [facility])    Status post radiation treatment.  . Prostatic hypertrophy    a. Hx of urinary retention, awaiting TURP.  [name] Severe protein-calorie malnutrition ([place])   . Tobacco abuse   . 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 Valvular heart disease    a. Mild  MR by TEE 08/2012.    Past Surgical History:  Procedure Laterality Date  . APPENDECTOMY    . BACK SURGERY    . CORONARY ARTERY BYPASS GRAFT  08/16/2012   Procedure: CORONARY ARTERY BYPASS GRAFTING (CABG);  Surgeon: Melrose Nakayama, MD;  Location: [place];  Service: Open Heart Surgery;  Laterality: N/A;  . IR REPLC GASTRO/COLONIC TUBE PERCUT W/FLUORO  02/13/2019  . LEFT HEART CATHETERIZATION WITH CORONARY ANGIOGRAM N/A 08/14/2012   Procedure: LEFT HEART CATHETERIZATION WITH CORONARY ANGIOGRAM;  Surgeon: Peter M Martinique, MD;  Location: [facility] CATH LAB;  Service: Cardiovascular;  Laterality: N/A;    Social History   Socioeconomic History  . Marital status: Married    Spouse name: Not on file  . Number of children: Not on file  . Years of education: Not on file  . Highest education level: Not on file  Occupational History  . Occupation: retired   Tobacco Use  . Smoking status: Former Smoker    Quit date: 11/12/1972    Years  since quitting: 47.2  . Smokeless tobacco: Never Used  Substance and Sexual Activity  . Alcohol use: No  . Drug use: No  . Sexual activity: Not Currently  Other Topics Concern  . Not on file  Social History Narrative   He is a long term patient of [facility]    Social Determinants of Health   Financial Resource Strain: Low Risk   . Difficulty of Paying Living Expenses: Not hard at all  Food Insecurity: Unknown  . Worried About Charity fundraiser in the Last Year: Patient refused  . Ran Out of Food in the Last Year:  Patient refused  Transportation Needs: Unknown  . Lack of Transportation (Medical): Patient refused  . Lack of Transportation (Non-Medical): Patient refused  Physical Activity: Inactive  . Days of Exercise per Week: 0 days  . Minutes of Exercise per Session: 0 min  Stress: Unknown  . Feeling of Stress : Patient refused  Social Connections: Unknown  . Frequency of Communication with Friends and Family: Patient refused  . Frequency of Social Gatherings with Friends and Family: Patient refused  . Attends Religious Services: Patient refused  . Active Member of Clubs or Organizations: Patient refused  . Attends Archivist Meetings: Patient refused  . Marital Status: Patient refused  Intimate Partner Violence: Unknown  . Fear of Current or Ex-Partner: Patient refused  . Emotionally Abused: Patient refused  . Physically Abused: Patient refused  . Sexually Abused: Patient refused   Family History  Problem Relation Age of Onset  . Hypertension Mother       VITAL SIGNS BP (!) 156/81   Pulse (!) 52   Temp 98.1 F (36.7 C) (Oral)   Resp 20   Ht 5' 11"  (1.803 m)   Wt 159 lb 6.4 oz (72.3 kg)   SpO2 98%   BMI 22.23 kg/m   Outpatient Encounter Medications as of 02/11/2020  Medication Sig  . acetaminophen (TYLENOL) 325 MG tablet Take 650 mg by mouth every 4 (four) hours as needed. per standing order for pain/fever DO NOT EXCEED >3,000 mg in 24 hours  . amLODipine (NORVASC) 5 MG tablet Take 5 mg by mouth daily.   [name] atorvastatin (LIPITOR) 40 MG tablet Take 40 mg by mouth at bedtime.   . baclofen (LIORESAL) 10 MG tablet Take 1 tablet by mouth 2 (two) times daily. Hold for sedation  . FLUoxetine (PROZAC) 20 MG/5ML solution Take 30 mg by mouth daily.   . Glucerna (GLUCERNA) LIQD Take 237 mLs by mouth daily.  . hydrocortisone cream (PREPARATION H) 1 % Apply rectally twice daily and as needed for hemorrhoids  . Insulin Glargine (LANTUS SOLOSTAR) 100 UNIT/ML Solostar Pen Inject 20  Units into the skin daily.  . insulin lispro (HUMALOG) 100 UNIT/ML cartridge Inject 10 Units into the skin 3 (three) times daily before meals. Give 10 units SQ with meals if Blood Sugar >150  . lansoprazole (PREVACID SOLUTAB) 30 MG disintegrating tablet Take 30 mg by mouth daily. Give before meals  . Melatonin 1 MG TABS Take 2 tablets by mouth at bedtime.   . metoprolol tartrate (LOPRESSOR) 25 MG tablet Take 12.5 mg by mouth 2 (two) times daily.   . NON FORMULARY Diet Type:  Dysphagia 3, thin no straws  . Pramoxine-Calamine (AVEENO ANTI-ITCH) 1-3 % LOTN Apply to the extremities and back daily and prn for itching/ dry skin. Do not apply to the buttocks wound  . valsartan (DIOVAN) 40 MG tablet Take 40  mg by mouth 2 (two) times daily.   . [DISCONTINUED] Insulin Glargine (BASAGLAR KWIKPEN) 100 UNIT/ML SOPN Inject 20 Units into the skin daily.   No facility-administered encounter medications on file as of 02/11/2020.     SIGNIFICANT DIAGNOSTIC EXAMS   LABS REVIEWED PREVIOUS:   03-13-19: wbc 9.3 hgb 14.4; hct 45.0; mcv 93.2; plt 270; chol 106; ldl 58 trig 124; hdl 23; urine micro-albumin 2236.0  05-23-19: wbc 7.0; hgb 14.6; hct 47.4; mcv 93.1; plt 290; glucose 202; bun 30; creat 1.51; k+ 4.0; na++ 140; ca 8.7; ast 50 alt 50 alk phos 131; albumin 2.6 . Hgb a1c 7.5  06-06-19: glucose 85; bun 37; creat 1.50; k+ 4.0; na++ 141; ca 8.8; liver normal albumin 2.9  09-06-19: hgb a1c 6.4 10-07-19: chol 102;ldl 63; trig 81; hdl 23; mag 20 PSA 0.36 10-31-19: hemoccult: neg; hep C neg 11-01-19: hemoccult: neg 11-02-19: hemoccult: neg   TODAY  01-15-20: wbc 7.7; hgb 16.5; hct 51.4; mcv 93.1 plt 188; glucose 99; bun 32; creat 1.49; k+ 4.2; na++ 142; ca 8.9; liver normal albumin 3.1; hgb a1c 6.5    Review of Systems  Reason unable to perform ROS: expressie aphasia    Physical Exam Constitutional:      General: He is not in acute distress.    Appearance: He is well-developed. He is not diaphoretic.  Neck:      Thyroid: No thyromegaly.  Cardiovascular:     Rate and Rhythm: Normal rate and regular rhythm.     Pulses: Normal pulses.     Heart sounds: Normal heart sounds.  Pulmonary:     Effort: Pulmonary effort is normal. No respiratory distress.     Breath sounds: Normal breath sounds.  Abdominal:     General: Bowel sounds are normal. There is no distension.     Palpations: Abdomen is soft.     Tenderness: There is no abdominal tenderness.  Genitourinary:    Comments:    Comments: Foley present  Penile tear present Musculoskeletal:     Cervical back: Neck supple.     Comments: Right side hemiparesis   Lymphadenopathy:     Cervical: No cervical adenopathy.  Skin:    General: Skin is warm and dry.  Neurological:     Mental Status: He is alert. Mental status is at baseline.  Psychiatric:        Mood and Affect: Mood normal.     ASSESSMENT/ PLAN:  TODAY:   1. Nontraumatic subcortical hemorrhage of left cerebral hemisphere/ hemorrhagic cerebrovascular accident: is stable will continue baclofen 10 mg twice daily for spasticity  2. CAD native artery native heart without angina: is status post CABG 2013: is stable will continue lopressor 12.5 mg twice daily   3. hypertension associated with diabetes mellitus: is stable b/p 156/81 will continue norvasc 5 mg daily and lopressor 12.5 mg twice daily diovan 40 mg twice daily    PREVIOUS:   4. Dyslipidemia associated with type 2 diabetes mellitus; is stable LDL 58 will continue lipitor 40 mg daily   5. Microalbuminuria due to type 2 diabetes mellitus: is without change is 2236; is on ARB will monitor   6. Major depression with psychotic features; is stable will continue prozac 30 mg daily is off antipsychotics   7. CKD stage 3 due to type 2 diabetes mellitus: is stable bun32; creat 1.49 will monitor   8 Chronic urine retention: is without change has long term foley followed by urology  9. Type 2  diabetes mellitus with peripheral  vascular disease: hgb a1c 6.5 will continue lantus 25 units nightly and novolog 10 units with meals.   10. Protein calorie malnutrition severe: is stable weight is 156 pounds albumin 3.1 will monitor        MD is aware of resident's narcotic use and is in agreement with current plan of care. We will attempt to wean resident as appropriate.  Ok Edwards NP [facility]  Contact [phone] Monday through Friday 8am- 5pm  After hours call [phone]

## 2020-02-12 DIAGNOSIS — I610 Nontraumatic intracerebral hemorrhage in hemisphere, subcortical: Secondary | ICD-10-CM | POA: Diagnosis not present

## 2020-02-12 DIAGNOSIS — Z1159 Encounter for screening for other viral diseases: Secondary | ICD-10-CM | POA: Diagnosis not present

## 2020-02-13 DIAGNOSIS — F5105 Insomnia due to other mental disorder: Secondary | ICD-10-CM | POA: Diagnosis not present

## 2020-02-13 DIAGNOSIS — F333 Major depressive disorder, recurrent, severe with psychotic symptoms: Secondary | ICD-10-CM | POA: Diagnosis not present

## 2020-02-19 ENCOUNTER — Encounter: Payer: Self-pay | Admitting: Adult Health

## 2020-02-19 ENCOUNTER — Non-Acute Institutional Stay: Payer: Self-pay | Admitting: Adult Health

## 2020-02-19 DIAGNOSIS — F323 Major depressive disorder, single episode, severe with psychotic features: Secondary | ICD-10-CM

## 2020-02-19 NOTE — Progress Notes (Signed)
Location:    [place] Room Number: 130/P Place of Service:  SNF (31)   CODE STATUS: Full Code  No Known Allergies  Chief Complaint  Patient presents with  . Medication Management    Medication Review     HPI:  He is taking Prozac 30 mg daily for his depression with psychotic features. He is off anti-psychotics. There are no reports of agitation; no reports of anxiety. There are no reports of insomnia. He is due for a gradual dose reduction.   Past Medical History:  Diagnosis Date  . A-fib ([place])    a. Post-op afib after CABG 08/2012.  [name] Acute gastric ulcer with hemorrhage   . Acute respiratory failure with hypoxia ([place])   . Aphasia following cerebral infarction   . Atrial fibrillation ([place])   . CAD (coronary artery disease)    a. NSTEMI s/p stent to distal RCA 03/2000. b. Inf MI s/p emergent thrombectomy/stenting mid RCA 10/2000. c. NSTEMI s/p CABGx4 (LIMA-LAD, SVG-OM2, seq SVG-acute marginal and PD) 01/15/81 - course complicated by confusion, post-op AF, L pleural effusion with thoracentesis. d. Normal LV function by echo 08/2012.  . Diabetes mellitus   . Diverticulosis   . Dysphagia following cerebral infarction   . Encephalopathy   . Extended spectrum beta lactamase (ESBL) resistance   . Generalized anxiety disorder   . GERD (gastroesophageal reflux disease)   . Hemiplegia and hemiparesis following cerebral infarction affecting right dominant side ([place])   . Hiatal hernia   . HLD (hyperlipidemia)   . HTN (hypertension)   . Non-ST elevation (NSTEMI) myocardial infarction ([place])   . Nontraumatic intracerebral hemorrhage in hemisphere, subcortical ([place])   . Peripheral vascular disease ([place])    a. Carotid dopplers neg 08/13/12. b. Pre-cabg ABIs - R=0.87 suggesting mild dz, L=1.29 possibly falsely elevated due to calcified vessels.  . Pleural effusion    a. L pleural eff after CABG s/p thoracentesis 08/22/12.  . Prostate cancer [facility])    Status post  radiation treatment.  . Prostatic hypertrophy    a. Hx of urinary retention, awaiting TURP.  [name] Severe protein-calorie malnutrition ([place])   . Tobacco abuse   . 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 Valvular heart disease    a. Mild  MR by TEE 08/2012.    Past Surgical History:  Procedure Laterality Date  . APPENDECTOMY    . BACK SURGERY    . CORONARY ARTERY BYPASS GRAFT  08/16/2012   Procedure: CORONARY ARTERY BYPASS GRAFTING (CABG);  Surgeon: Melrose Nakayama, MD;  Location: [place];  Service: Open Heart Surgery;  Laterality: N/A;  . IR REPLC GASTRO/COLONIC TUBE PERCUT W/FLUORO  02/13/2019  . LEFT HEART CATHETERIZATION WITH CORONARY ANGIOGRAM N/A 08/14/2012   Procedure: LEFT HEART CATHETERIZATION WITH CORONARY ANGIOGRAM;  Surgeon: Peter M Martinique, MD;  Location: [facility] CATH LAB;  Service: Cardiovascular;  Laterality: N/A;    Social History   Socioeconomic History  . Marital status: Married    Spouse name: Not on file  . Number of children: Not on file  . Years of education: Not on file  . Highest education level: Not on file  Occupational History  . Occupation: retired   Tobacco Use  . Smoking status: Former Smoker    Quit date: 11/12/1972    Years since quitting: 47.3  . Smokeless tobacco: Never Used  Substance and Sexual Activity  . Alcohol use: No  . Drug use: No  . Sexual  activity: Not Currently  Other Topics Concern  . Not on file  Social History Narrative   He is a long term patient of [facility]    Social Determinants of Health   Financial Resource Strain: Low Risk   . Difficulty of Paying Living Expenses: Not hard at all  Food Insecurity: Unknown  . Worried About Charity fundraiser in the Last Year: Patient refused  . Ran Out of Food in the Last Year: Patient refused  Transportation Needs: Unknown  . Lack of Transportation (Medical): Patient refused  . Lack of Transportation (Non-Medical): Patient refused  Physical Activity: Inactive  . Days of  Exercise per Week: 0 days  . Minutes of Exercise per Session: 0 min  Stress: Unknown  . Feeling of Stress : Patient refused  Social Connections: Unknown  . Frequency of Communication with Friends and Family: Patient refused  . Frequency of Social Gatherings with Friends and Family: Patient refused  . Attends Religious Services: Patient refused  . Active Member of Clubs or Organizations: Patient refused  . Attends Archivist Meetings: Patient refused  . Marital Status: Patient refused  Intimate Partner Violence: Unknown  . Fear of Current or Ex-Partner: Patient refused  . Emotionally Abused: Patient refused  . Physically Abused: Patient refused  . Sexually Abused: Patient refused   Family History  Problem Relation Age of Onset  . Hypertension Mother       VITAL SIGNS BP 137/67   Pulse (!) 52   Temp 97.6 F (36.4 C) (Oral)   Resp 20   Ht _0  (1.803 m)   Wt 159 lb 6.4 oz (72.3 kg)   SpO2 98%   BMI 22.23 kg/m   Outpatient Encounter Medications as of 02/19/2020  Medication Sig  . acetaminophen (TYLENOL) 325 MG tablet Take 650 mg by mouth every 4 (four) hours as needed. per standing order for pain/fever DO NOT EXCEED >3,000 mg in 24 hours  . amLODipine (NORVASC) 5 MG tablet Take 5 mg by mouth daily.   [name] atorvastatin (LIPITOR) 40 MG tablet Take 40 mg by mouth at bedtime.   . baclofen (LIORESAL) 10 MG tablet Take 1 tablet by mouth 2 (two) times daily. Hold for sedation  . FLUoxetine (PROZAC) 20 MG/5ML solution Take 30 mg by mouth daily.   . Glucerna (GLUCERNA) LIQD Take 237 mLs by mouth daily.  . hydrocortisone cream (PREPARATION H) 1 % Apply rectally twice daily and as needed for hemorrhoids  . Insulin Glargine (LANTUS SOLOSTAR) 100 UNIT/ML Solostar Pen Inject 20 Units into the skin daily.  . insulin lispro (HUMALOG) 100 UNIT/ML cartridge Inject 10 Units into the skin 3 (three) times daily before meals. Give 10 units SQ with meals if Blood Sugar >150  .  lansoprazole (PREVACID SOLUTAB) 30 MG disintegrating tablet Take 30 mg by mouth daily. Give before meals  . Melatonin 1 MG TABS Take 2 tablets by mouth at bedtime.   . metoprolol tartrate (LOPRESSOR) 25 MG tablet Take 12.5 mg by mouth 2 (two) times daily.   . NON FORMULARY Diet Type:  Dysphagia 3, thin no straws  . Pramoxine-Calamine (AVEENO ANTI-ITCH) 1-3 % LOTN Apply to the extremities and back daily and prn for itching/ dry skin. Do not apply to the buttocks wound  . valsartan (DIOVAN) 40 MG tablet Take 40 mg by mouth 2 (two) times daily.    No facility-administered encounter medications on file as of 02/19/2020.     SIGNIFICANT DIAGNOSTIC EXAMS  LABS REVIEWED PREVIOUS:   03-13-19: wbc 9.3 hgb 14.4; hct 45.0; mcv 93.2; plt 270; chol 106; ldl 58 trig 124; hdl 23; urine micro-albumin 2236.0  05-23-19: wbc 7.0; hgb 14.6; hct 47.4; mcv 93.1; plt 290; glucose 202; bun 30; creat 1.51; k+ 4.0; na++ 140; ca 8.7; ast 50 alt 50 alk phos 131; albumin 2.6 . Hgb a1c 7.5  06-06-19: glucose 85; bun 37; creat 1.50; k+ 4.0; na++ 141; ca 8.8; liver normal albumin 2.9  09-06-19: hgb a1c 6.4 10-07-19: chol 102;ldl 63; trig 81; hdl 23; mag 20 PSA 0.36 10-31-19: hemoccult: neg; hep C neg 11-01-19: hemoccult: neg 11-02-19: hemoccult: neg  01-15-20: wbc 7.7; hgb 16.5; hct 51.4; mcv 93.1 plt 188; glucose 99; bun 32; creat 1.49; k+ 4.2; na++ 142; ca 8.9; liver normal albumin 3.1; hgb a1c 6.5   NO NEW LABS.   Review of Systems  Reason unable to perform ROS: expressive aphasia     Physical Exam Constitutional:      General: He is not in acute distress.    Appearance: He is well-developed. He is not diaphoretic.  Neck:     Thyroid: No thyromegaly.  Cardiovascular:     Rate and Rhythm: Normal rate and regular rhythm.     Pulses: Normal pulses.     Heart sounds: Normal heart sounds.  Pulmonary:     Effort: Pulmonary effort is normal. No respiratory distress.     Breath sounds: Normal breath sounds.   Abdominal:     General: Bowel sounds are normal. There is no distension.     Palpations: Abdomen is soft.     Tenderness: There is no abdominal tenderness.  Genitourinary:    Comments: Foley present  Penile tear present Musculoskeletal:     Cervical back: Neck supple.     Right lower leg: No edema.     Left lower leg: No edema.     Comments: Right side hemiparesis   Lymphadenopathy:     Cervical: No cervical adenopathy.  Skin:    General: Skin is warm and dry.  Neurological:     Mental Status: He is alert. Mental status is at baseline.  Psychiatric:        Mood and Affect: Mood normal.       ASSESSMENT/ PLAN:  TODAY  1. Major depression with psychotic features: is stable will lower his prozac to 20 mg daily and will monitor his status.    MD is aware of resident's narcotic use and is in agreement with current plan of care. We will attempt to wean resident as appropriate.  Ok Edwards NP [facility]  Contact [phone] Monday through Friday 8am- 5pm  After hours call [phone]

## 2020-02-20 DIAGNOSIS — I610 Nontraumatic intracerebral hemorrhage in hemisphere, subcortical: Secondary | ICD-10-CM | POA: Diagnosis not present

## 2020-02-20 DIAGNOSIS — Z1159 Encounter for screening for other viral diseases: Secondary | ICD-10-CM | POA: Diagnosis not present

## 2020-02-26 DIAGNOSIS — Z1159 Encounter for screening for other viral diseases: Secondary | ICD-10-CM | POA: Diagnosis not present

## 2020-02-26 DIAGNOSIS — I610 Nontraumatic intracerebral hemorrhage in hemisphere, subcortical: Secondary | ICD-10-CM | POA: Diagnosis not present

## 2020-03-17 DIAGNOSIS — Z1159 Encounter for screening for other viral diseases: Secondary | ICD-10-CM | POA: Diagnosis not present

## 2020-03-17 DIAGNOSIS — I610 Nontraumatic intracerebral hemorrhage in hemisphere, subcortical: Secondary | ICD-10-CM | POA: Diagnosis not present

## 2020-03-25 ENCOUNTER — Non-Acute Institutional Stay (SKILLED_NURSING_FACILITY): Payer: Medicare PPO | Admitting: Internal Medicine

## 2020-03-25 ENCOUNTER — Encounter: Payer: Self-pay | Admitting: Internal Medicine

## 2020-03-25 DIAGNOSIS — F323 Major depressive disorder, single episode, severe with psychotic features: Secondary | ICD-10-CM

## 2020-03-25 DIAGNOSIS — E1169 Type 2 diabetes mellitus with other specified complication: Secondary | ICD-10-CM

## 2020-03-25 DIAGNOSIS — E785 Hyperlipidemia, unspecified: Secondary | ICD-10-CM

## 2020-03-25 DIAGNOSIS — E1122 Type 2 diabetes mellitus with diabetic chronic kidney disease: Secondary | ICD-10-CM | POA: Diagnosis not present

## 2020-03-25 DIAGNOSIS — N183 Chronic kidney disease, stage 3 unspecified: Secondary | ICD-10-CM | POA: Diagnosis not present

## 2020-03-25 NOTE — Progress Notes (Signed)
Location:  [facility] Room Number: [id number] Place of Service:  SNF (31)  Hennie Duos, MD  Patient Care Team: Hennie Duos, MD as PCP - General (Internal Medicine) [name], MD as Consulting Physician (Urology) Nyoka Cowden Phylis Bougie, NP as Nurse Practitioner (Geriatric Medicine)  Extended Emergency Contact Information Primary Emergency Contact: Holcomb,[name] Address: [address]          [place] [address] [name] of [place] Phone: [phone] Mobile Phone: [phone] Relation: Spouse Secondary Emergency Contact: [name] of [place] Phone: [phone] Relation: Son    Allergies: Patient has no known allergies.  Chief Complaint  Patient presents with  . Medical Management of Chronic Issues    Routine [place] visit  . Quality Metric Gaps    Needs colonoscopy or equivalent    HPI: Patient is a 74 y.o. male who is being seen for routine issues of depression, hyperlipidemia, and CKD 3.  Past Medical History:  Diagnosis Date  . A-fib ([place])    a. Post-op afib after CABG 08/2012.  [name] Acute gastric ulcer with hemorrhage   . Acute respiratory failure with hypoxia ([place])   . Aphasia following cerebral infarction   . Atrial fibrillation ([place])   . CAD (coronary artery disease)    a. NSTEMI s/p stent to distal RCA 03/2000. b. Inf MI s/p emergent thrombectomy/stenting mid RCA 10/2000. c. NSTEMI s/p CABGx4 (LIMA-LAD, SVG-OM2, seq SVG-acute marginal and PD) [id number] - course complicated by confusion, post-op AF, L pleural effusion with thoracentesis. d. Normal LV function by echo 08/2012.  . Diabetes mellitus   . Diverticulosis   . Dysphagia following cerebral infarction   . Encephalopathy   . Extended spectrum beta lactamase (ESBL) resistance   . Generalized anxiety disorder   . GERD (gastroesophageal reflux disease)   . Hemiplegia and hemiparesis following cerebral infarction affecting right dominant  side ([place])   . Hiatal hernia   . HLD (hyperlipidemia)   . HTN (hypertension)   . Non-ST elevation (NSTEMI) myocardial infarction ([place])   . Nontraumatic intracerebral hemorrhage in hemisphere, subcortical ([place])   . Peripheral vascular disease ([place])    a. Carotid dopplers neg 08/13/12. b. Pre-cabg ABIs - R=0.87 suggesting mild dz, L=1.29 possibly falsely elevated due to calcified vessels.  . Pleural effusion    a. L pleural eff after CABG s/p thoracentesis 08/22/12.  . Prostate cancer [facility])    Status post radiation treatment.  . Prostatic hypertrophy    a. Hx of urinary retention, awaiting TURP.  [name] Severe protein-calorie malnutrition ([place])   . Tobacco abuse   . 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 Valvular heart disease    a. Mild  MR by TEE 08/2012.    Past Surgical History:  Procedure Laterality Date  . APPENDECTOMY    . BACK SURGERY    . CORONARY ARTERY BYPASS GRAFT  08/16/2012   Procedure: CORONARY ARTERY BYPASS GRAFTING (CABG);  Surgeon: Melrose Nakayama, MD;  Location: [facility];  Service: Open Heart Surgery;  Laterality: N/A;  . IR REPLC GASTRO/COLONIC TUBE PERCUT W/FLUORO  02/13/2019  . LEFT HEART CATHETERIZATION WITH CORONARY ANGIOGRAM N/A 08/14/2012   Procedure: LEFT HEART CATHETERIZATION WITH CORONARY ANGIOGRAM;  Surgeon: Peter M Martinique, MD;  Location: [facility] CATH LAB;  Service: Cardiovascular;  Laterality: N/A;    Allergies as of 03/25/2020   No Known Allergies     Medication List    Notice  This visit is during an admission. Changes to the med list made in this visit will be reflected in the After Visit Summary of the admission.    Current Outpatient Medications on File Prior to Visit  Medication Sig Dispense Refill  . acetaminophen (TYLENOL) 325 MG tablet Take 650 mg by mouth every 4 (four) hours as needed. per standing order for pain/fever DO NOT EXCEED >3,000 mg in 24 hours    . amLODipine (NORVASC) 5 MG tablet Take 5 mg by mouth daily.     [name]  atorvastatin (LIPITOR) 40 MG tablet Take 40 mg by mouth at bedtime.     . baclofen (LIORESAL) 10 MG tablet Take 1 tablet by mouth 2 (two) times daily. Hold for sedation    . FLUoxetine (PROZAC) 20 MG/5ML solution Take 30 mg by mouth daily.     . Glucerna (GLUCERNA) LIQD Take 237 mLs by mouth daily.    . hydrocortisone cream (PREPARATION H) 1 % Apply rectally twice daily and as needed for hemorrhoids    . Insulin Glargine (LANTUS SOLOSTAR) 100 UNIT/ML Solostar Pen Inject 20 Units into the skin daily.    . insulin lispro (HUMALOG) 100 UNIT/ML cartridge Inject 10 Units into the skin 3 (three) times daily before meals. Give 10 units SQ with meals if Blood Sugar >150    . lansoprazole (PREVACID SOLUTAB) 30 MG disintegrating tablet Take 30 mg by mouth daily. Give before meals    . Melatonin 1 MG TABS Take 2 tablets by mouth at bedtime.     . metoprolol tartrate (LOPRESSOR) 25 MG tablet Take 12.5 mg by mouth 2 (two) times daily.     . NON FORMULARY Diet Type:  Dysphagia 3, thin no straws    . Pramoxine-Calamine (AVEENO ANTI-ITCH) 1-3 % LOTN Apply to the extremities and back daily and prn for itching/ dry skin. Do not apply to the buttocks wound    . valsartan (DIOVAN) 40 MG tablet Take 40 mg by mouth 2 (two) times daily.      No current facility-administered medications on file prior to visit.     No orders of the defined types were placed in this encounter.   Immunization History  Administered Date(s) Administered  . Influenza-Unspecified 09/16/2019  . Moderna SARS-COVID-2 Vaccination 12/18/2019, 01/15/2020  . Pneumococcal-Unspecified 09/03/2019  . Td 09/03/2019    Social History   Tobacco Use  . Smoking status: Former Smoker    Quit date: 11/12/1972    Years since quitting: 47.4  . Smokeless tobacco: Never Used  Substance Use Topics  . Alcohol use: No    Review of Systems unable to obtain secondary to expressive aphasia    Vitals:   03/25/20 1551  BP: (!) 147/77  Pulse: (!)  50  Resp: 20  Temp: (!) 97 F (36.1 C)  SpO2: 98%   Body mass index is 22.15 kg/m. Physical Exam  GENERAL APPEARANCE: Alert, minimally conversant, No acute distress  SKIN: No diaphoresis rash HEENT: Unremarkable RESPIRATORY: Breathing is even, unlabored. Lung sounds are clear   CARDIOVASCULAR: Heart RRR no murmurs, rubs or gallops. No peripheral edema  GASTROINTESTINAL: Abdomen is soft, non-tender, not distended w/ normal bowel sounds.  GENITOURINARY: Bladder non tender, not distended  MUSCULOSKELETAL: No abnormal joints or musculature NEUROLOGIC: Cranial nerves 2-12 grossly intact except expressive aphasia; right-sided hemiparesis PSYCHIATRIC: Mood and affect appropriate to situation, no behavioral issues  Patient Active Problem List   Diagnosis Date Noted  . Microalbuminuria due to type 2 diabetes  mellitus ([place]) 06/04/2019  . CKD stage 3 due to type 2 diabetes mellitus ([place]) 06/04/2019  . Foley catheter in place 04/25/2019  . Penis injury, sequela 04/25/2019  . Hypertension associated with diabetes ([place]) 01/28/2019  . Dyslipidemia associated with type 2 diabetes mellitus ([place]) 01/28/2019  . Dysphagia, oropharyngeal phase 01/28/2019  . Type 2 diabetes mellitus with peripheral vascular disease ([place]) 01/28/2019  . Major depression with psychotic features ([place]) 01/28/2019  . Protein-calorie malnutrition, severe 01/08/2019  . Pressure injury of skin 01/05/2019  . History of hemorrhagic cerebrovascular accident (CVA) with residual deficit 01/04/2019  . Urinary retention 12/24/2018  . IVH (intraventricular hemorrhage) ([place]) 11/18/2018  . Nontraumatic subcortical hemorrhage of left cerebral hemisphere ([place]) 11/18/2018  . CAD (coronary artery disease)   . Peripheral vascular disease ([place])   . A-fib One Episode after CABG in 2013   . HTN (hypertension) 08/15/2012    CMP     Component Value Date/Time   NA 142 01/15/2020 0733   K 4.2 01/15/2020 0733   CL 103 01/15/2020 0733    CO2 30 01/15/2020 0733   GLUCOSE 99 01/15/2020 0733   BUN 32 (H) 01/15/2020 0733   CREATININE 1.49 (H) 01/15/2020 0733   CALCIUM 8.9 01/15/2020 0733   PROT 6.4 (L) 01/15/2020 0733   ALBUMIN 3.1 (L) 01/15/2020 0733   AST 22 01/15/2020 0733   ALT 27 01/15/2020 0733   ALKPHOS 117 01/15/2020 0733   BILITOT 1.3 (H) 01/15/2020 0733   GFRNONAA 45 (L) 01/15/2020 0733   GFRAA 52 (L) 01/15/2020 0733   Recent Labs    05/23/19 1037 06/06/19 0445 10/07/19 0730 01/15/20 0733  NA 140 141  --  142  K 4.0 4.0  --  4.2  CL 104 103  --  103  CO2 28 27  --  30  GLUCOSE 202* 85  --  99  BUN 30* 37*  --  32*  CREATININE 1.51* 1.50*  --  1.49*  CALCIUM 8.7* 8.8*  --  8.9  MG  --   --  2.0  --    Recent Labs    05/23/19 1037 06/06/19 0445 01/15/20 0733  AST 50* 24 22  ALT 49* 32 27  ALKPHOS 131* 112 117  BILITOT 1.0 0.8 1.3*  PROT 6.1* 6.2* 6.4*  ALBUMIN 2.6* 2.9* 3.1*   Recent Labs    05/23/19 1037 01/15/20 0733  WBC 7.0 7.7  HGB 14.6 16.5  HCT 47.4 51.4  MCV 93.1 93.1  PLT 290 188   Recent Labs    10/07/19 0730  CHOL 102  LDLCALC 63  TRIG 81   Lab Results  Component Value Date   MICROALBUR 2,236.0 (H) 03/13/2019   No results found for: TSH Lab Results  Component Value Date   HGBA1C 6.5 (H) 01/15/2020   Lab Results  Component Value Date   CHOL 102 10/07/2019   HDL 23 (L) 10/07/2019   LDLCALC 63 10/07/2019   TRIG 81 10/07/2019   CHOLHDL 4.4 10/07/2019    Significant Diagnostic Results in last 30 days:  No results found.  Assessment and Plan  Major depression with psychotic features [facility]) Patient controlled; continue Prozac 30 mg daily  Dyslipidemia associated with type 2 diabetes mellitus ([place]) Good control; continue Lipitor 40 mg daily  CKD stage 3 due to type 2 diabetes mellitus (HCC) Creatinine and GFR unchanged; follow at intervals     Hennie Duos, MD

## 2020-04-01 ENCOUNTER — Encounter: Payer: Self-pay | Admitting: Internal Medicine

## 2020-04-04 ENCOUNTER — Encounter: Payer: Self-pay | Admitting: Internal Medicine

## 2020-04-04 NOTE — Assessment & Plan Note (Signed)
Creatinine and GFR unchanged; follow at intervals

## 2020-04-04 NOTE — Assessment & Plan Note (Signed)
Good control; continue Lipitor 40 mg daily

## 2020-04-04 NOTE — Assessment & Plan Note (Signed)
Patient controlled; continue Prozac 30 mg daily

## 2020-04-08 DIAGNOSIS — Z1159 Encounter for screening for other viral diseases: Secondary | ICD-10-CM | POA: Diagnosis not present

## 2020-04-08 DIAGNOSIS — I610 Nontraumatic intracerebral hemorrhage in hemisphere, subcortical: Secondary | ICD-10-CM | POA: Diagnosis not present

## 2020-04-24 DIAGNOSIS — F5105 Insomnia due to other mental disorder: Secondary | ICD-10-CM | POA: Diagnosis not present

## 2020-04-24 DIAGNOSIS — F333 Major depressive disorder, recurrent, severe with psychotic symptoms: Secondary | ICD-10-CM | POA: Diagnosis not present

## 2020-04-28 ENCOUNTER — Encounter: Payer: Self-pay | Admitting: Adult Health

## 2020-04-28 ENCOUNTER — Non-Acute Institutional Stay (SKILLED_NURSING_FACILITY): Payer: Medicare PPO | Admitting: Adult Health

## 2020-04-28 DIAGNOSIS — E1169 Type 2 diabetes mellitus with other specified complication: Secondary | ICD-10-CM

## 2020-04-28 DIAGNOSIS — Z978 Presence of other specified devices: Secondary | ICD-10-CM

## 2020-04-28 DIAGNOSIS — I2510 Atherosclerotic heart disease of native coronary artery without angina pectoris: Secondary | ICD-10-CM

## 2020-04-28 DIAGNOSIS — E1159 Type 2 diabetes mellitus with other circulatory complications: Secondary | ICD-10-CM

## 2020-04-28 DIAGNOSIS — E1122 Type 2 diabetes mellitus with diabetic chronic kidney disease: Secondary | ICD-10-CM | POA: Diagnosis not present

## 2020-04-28 DIAGNOSIS — E785 Hyperlipidemia, unspecified: Secondary | ICD-10-CM

## 2020-04-28 DIAGNOSIS — E1129 Type 2 diabetes mellitus with other diabetic kidney complication: Secondary | ICD-10-CM | POA: Diagnosis not present

## 2020-04-28 DIAGNOSIS — R339 Retention of urine, unspecified: Secondary | ICD-10-CM

## 2020-04-28 DIAGNOSIS — I152 Hypertension secondary to endocrine disorders: Secondary | ICD-10-CM

## 2020-04-28 DIAGNOSIS — I610 Nontraumatic intracerebral hemorrhage in hemisphere, subcortical: Secondary | ICD-10-CM | POA: Diagnosis not present

## 2020-04-28 DIAGNOSIS — E1151 Type 2 diabetes mellitus with diabetic peripheral angiopathy without gangrene: Secondary | ICD-10-CM

## 2020-04-28 DIAGNOSIS — F323 Major depressive disorder, single episode, severe with psychotic features: Secondary | ICD-10-CM

## 2020-04-28 DIAGNOSIS — N183 Chronic kidney disease, stage 3 unspecified: Secondary | ICD-10-CM

## 2020-04-28 DIAGNOSIS — R809 Proteinuria, unspecified: Secondary | ICD-10-CM

## 2020-04-28 DIAGNOSIS — I10 Essential (primary) hypertension: Secondary | ICD-10-CM

## 2020-04-28 NOTE — Progress Notes (Deleted)
Location:    [place] Room Number: 130/P Place of Service:  SNF (31)   CODE STATUS: Full Code  No Known Allergies  Chief Complaint  Patient presents with  . Annual Exam    Annual    HPI:    Past Medical History:  Diagnosis Date  . A-fib ([place])    a. Post-op afib after CABG 08/2012.  [name] Acute gastric ulcer with hemorrhage   . Acute respiratory failure with hypoxia ([place])   . Aphasia following cerebral infarction   . Atrial fibrillation ([place])   . CAD (coronary artery disease)    a. NSTEMI s/p stent to distal RCA 03/2000. b. Inf MI s/p emergent thrombectomy/stenting mid RCA 10/2000. c. NSTEMI s/p CABGx4 (LIMA-LAD, SVG-OM2, seq SVG-acute marginal and PD) [id number] - course complicated by confusion, post-op AF, L pleural effusion with thoracentesis. d. Normal LV function by echo 08/2012.  . Diabetes mellitus   . Diverticulosis   . Dysphagia following cerebral infarction   . Encephalopathy   . Extended spectrum beta lactamase (ESBL) resistance   . Generalized anxiety disorder   . GERD (gastroesophageal reflux disease)   . Hemiplegia and hemiparesis following cerebral infarction affecting right dominant side ([place])   . Hiatal hernia   . HLD (hyperlipidemia)   . HTN (hypertension)   . Non-ST elevation (NSTEMI) myocardial infarction ([place])   . Nontraumatic intracerebral hemorrhage in hemisphere, subcortical ([place])   . Peripheral vascular disease ([place])    a. Carotid dopplers neg 08/13/12. b. Pre-cabg ABIs - R=0.87 suggesting mild dz, L=1.29 possibly falsely elevated due to calcified vessels.  . Pleural effusion    a. L pleural eff after CABG s/p thoracentesis 08/22/12.  . Prostate cancer [facility])    Status post radiation treatment.  . Prostatic hypertrophy    a. Hx of urinary retention, awaiting TURP.  [name] Severe protein-calorie malnutrition ([place])   . Tobacco abuse   . 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 Valvular heart disease    a. Mild  MR by  TEE 08/2012.    Past Surgical History:  Procedure Laterality Date  . APPENDECTOMY    . BACK SURGERY    . CORONARY ARTERY BYPASS GRAFT  08/16/2012   Procedure: CORONARY ARTERY BYPASS GRAFTING (CABG);  Surgeon: Melrose Nakayama, MD;  Location: [place];  Service: Open Heart Surgery;  Laterality: N/A;  . IR REPLC GASTRO/COLONIC TUBE PERCUT W/FLUORO  02/13/2019  . LEFT HEART CATHETERIZATION WITH CORONARY ANGIOGRAM N/A 08/14/2012   Procedure: LEFT HEART CATHETERIZATION WITH CORONARY ANGIOGRAM;  Surgeon: Peter M Martinique, MD;  Location: [facility] CATH LAB;  Service: Cardiovascular;  Laterality: N/A;    Social History   Socioeconomic History  . Marital status: Married    Spouse name: Not on file  . Number of children: Not on file  . Years of education: Not on file  . Highest education level: Not on file  Occupational History  . Occupation: retired   Tobacco Use  . Smoking status: Former Smoker    Quit date: 11/12/1972    Years since quitting: 47.4  . Smokeless tobacco: Never Used  Substance and Sexual Activity  . Alcohol use: No  . Drug use: No  . Sexual activity: Not Currently  Other Topics Concern  . Not on file  Social History Narrative   He is a long term patient of [facility]    Social Determinants of Health   Financial Resource Strain: Low Risk   .  Difficulty of Paying Living Expenses: Not hard at all  Food Insecurity: Unknown  . Worried About Charity fundraiser in the Last Year: Patient refused  . Ran Out of Food in the Last Year: Patient refused  Transportation Needs: Unknown  . Lack of Transportation (Medical): Patient refused  . Lack of Transportation (Non-Medical): Patient refused  Physical Activity: Inactive  . Days of Exercise per Week: 0 days  . Minutes of Exercise per Session: 0 min  Stress: Unknown  . Feeling of Stress : Patient refused  Social Connections: Unknown  . Frequency of Communication with Friends and Family: Patient refused  . Frequency of Social Gatherings with  Friends and Family: Patient refused  . Attends Religious Services: Patient refused  . Active Member of Clubs or Organizations: Patient refused  . Attends Archivist Meetings: Patient refused  . Marital Status: Patient refused  Intimate Partner Violence: Unknown  . Fear of Current or Ex-Partner: Patient refused  . Emotionally Abused: Patient refused  . Physically Abused: Patient refused  . Sexually Abused: Patient refused   Family History  Problem Relation Age of Onset  . Hypertension Mother       VITAL SIGNS BP (!) 164/92   Pulse (!) 53   Temp (!) 97.5 F (36.4 C) (Oral)   Resp 20   Ht 5\' 11"  (1.803 m)   Wt 162 lb 3.2 oz (73.6 kg)   SpO2 98%   BMI 22.62 kg/m   Outpatient Encounter Medications as of 04/28/2020  Medication Sig  . acetaminophen (TYLENOL) 325 MG tablet Take 650 mg by mouth every 4 (four) hours as needed. per standing order for pain/fever DO NOT EXCEED >3,000 mg in 24 hours  . amLODipine (NORVASC) 5 MG tablet Take 5 mg by mouth daily.   [name] atorvastatin (LIPITOR) 40 MG tablet Take 40 mg by mouth at bedtime.   . baclofen (LIORESAL) 10 MG tablet Take 1 tablet by mouth 2 (two) times daily. Hold for sedation  . FLUoxetine (PROZAC) 20 MG/5ML solution Take 30 mg by mouth daily.   . Glucerna (GLUCERNA) LIQD Take 237 mLs by mouth daily.  . hydrocortisone cream (PREPARATION H) 1 % Apply rectally twice daily and as needed for hemorrhoids  . Insulin Glargine (LANTUS SOLOSTAR) 100 UNIT/ML Solostar Pen Inject 20 Units into the skin daily.  . insulin lispro (HUMALOG) 100 UNIT/ML cartridge Inject 10 Units into the skin 3 (three) times daily before meals. Give 10 units SQ with meals if Blood Sugar >150  . lansoprazole (PREVACID SOLUTAB) 30 MG disintegrating tablet Take 30 mg by mouth daily. Give before meals  . Melatonin 1 MG TABS Take 2 tablets by mouth at bedtime.   . metoprolol tartrate (LOPRESSOR) 25 MG tablet Take 12.5 mg by mouth 2 (two) times daily.   . NON  FORMULARY Diet Type:  Dysphagia 3, thin no straws  . Pramoxine-Calamine (AVEENO ANTI-ITCH) 1-3 % LOTN Apply to the extremities and back daily and prn for itching/ dry skin. Do not apply to the buttocks wound  . valsartan (DIOVAN) 40 MG tablet Take 40 mg by mouth 2 (two) times daily.    No facility-administered encounter medications on file as of 04/28/2020.     SIGNIFICANT DIAGNOSTIC EXAMS       ASSESSMENT/ PLAN:    MD is aware of resident's narcotic use and is in agreement with current plan of care. We will attempt to wean resident as appropriate.  Ok Edwards NP [facility] Adult Medicine  Contact [phone] Monday through Friday 8am- 5pm  After hours call [phone]

## 2020-04-28 NOTE — Progress Notes (Signed)
Provider: Phillips Grout NP  Location   [place]  PCP: [name], NP   Extended Emergency Contact Information Primary Emergency Contact: Liburd,[name] Address: [address]          [place] [address] [name] of [place] Phone: [phone] Mobile Phone: [phone] Relation: Spouse Secondary Emergency Contact: [name] of [place] Phone: [phone] Relation: Son  Codes status: Full Code Goals of care: advanced directive information Advanced Directives 04/28/2020  Does Patient Have a Medical Advance Directive? Yes  Type of Advance Directive (No Data)  Does patient want to make changes to medical advance directive? No - Patient declined  Would patient like information on creating a medical advance directive? -  Pre-existing out of facility DNR order (yellow form or pink MOST form) Pink MOST form placed in chart (order not valid for inpatient use)     No Known Allergies  Chief Complaint  Patient presents with  . Annual Exam    Annual    HPI  He is a 74 year old long term resident being seen for his annual exam. He has not required any hospitalizations or ED visits this past year. He has had falls without serious injury. His weight is stable. There are no reports of uncontrolled pain. No reports of agitation or depressive thoughts. He continues to be followed for his chronic illnesses including: cva; diabetes; hypertension    Past Medical History:  Diagnosis Date  . A-fib ([place])    a. Post-op afib after CABG 08/2012.  [name] Acute gastric ulcer with hemorrhage   . Acute respiratory failure with hypoxia ([place])   . Aphasia following cerebral infarction   . Atrial fibrillation ([place])   . CAD (coronary artery disease)    a. NSTEMI s/p stent to distal RCA 03/2000. b. Inf MI s/p emergent thrombectomy/stenting mid RCA 10/2000. c. NSTEMI s/p CABGx4 (LIMA-LAD, SVG-OM2, seq SVG-acute marginal and PD) 02/10/34 - course complicated by  confusion, post-op AF, L pleural effusion with thoracentesis. d. Normal LV function by echo 08/2012.  . Diabetes mellitus   . Diverticulosis   . Dysphagia following cerebral infarction   . Encephalopathy   . Extended spectrum beta lactamase (ESBL) resistance   . Generalized anxiety disorder   . GERD (gastroesophageal reflux disease)   . Hemiplegia and hemiparesis following cerebral infarction affecting right dominant side ([place])   . Hiatal hernia   . HLD (hyperlipidemia)   . HTN (hypertension)   . Non-ST elevation (NSTEMI) myocardial infarction ([place])   . Nontraumatic intracerebral hemorrhage in hemisphere, subcortical ([place])   . Peripheral vascular disease ([place])    a. Carotid dopplers neg 08/13/12. b. Pre-cabg ABIs - R=0.87 suggesting mild dz, L=1.29 possibly falsely elevated due to calcified vessels.  . Pleural effusion    a. L pleural eff after CABG s/p thoracentesis 08/22/12.  . Prostate cancer [facility])    Status post radiation treatment.  . Prostatic hypertrophy    a. Hx of urinary retention, awaiting TURP.  [name] Severe protein-calorie malnutrition ([place])   . Tobacco abuse   . 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 Valvular heart disease    a. Mild  MR by TEE 08/2012.   Past Surgical History:  Procedure Laterality Date  . APPENDECTOMY    . BACK SURGERY    . CORONARY ARTERY BYPASS GRAFT  08/16/2012   Procedure: CORONARY ARTERY BYPASS GRAFTING (CABG);  Surgeon: Melrose Nakayama, MD;  Location: [place];  Service: Open Heart Surgery;  Laterality: N/A;  . IR REPLC GASTRO/COLONIC TUBE PERCUT W/FLUORO  02/13/2019  . LEFT HEART CATHETERIZATION WITH CORONARY ANGIOGRAM N/A 08/14/2012   Procedure: LEFT HEART CATHETERIZATION WITH CORONARY ANGIOGRAM;  Surgeon: Peter M Martinique, MD;  Location: [facility] CATH LAB;  Service: Cardiovascular;  Laterality: N/A;    reports that he quit smoking about 47 years ago. He has never used smokeless tobacco. He reports that he does not drink alcohol or use  drugs. Social History   Tobacco Use  . Smoking status: Former Smoker    Quit date: 11/12/1972    Years since quitting: 47.4  . Smokeless tobacco: Never Used  Substance Use Topics  . Alcohol use: No  . Drug use: No   Family History  Problem Relation Age of Onset  . Hypertension Mother     Pertinent  Health Maintenance Due  Topic Date Due  . COLONOSCOPY  Never done  . URINE MICROALBUMIN  03/12/2020  . INFLUENZA VACCINE  07/12/2020  . HEMOGLOBIN A1C  07/14/2020  . PNA vac Low Risk Adult (2 of 2 - PCV13) 09/02/2020  . FOOT EXAM  10/03/2020  . OPHTHALMOLOGY EXAM  10/20/2020   Fall Risk  10/30/2019 10/30/2019  Falls in the past year? 1 -  Number falls in past yr: 1 -  Injury with Fall? 0 0  Risk for fall due to : History of fall(s);Impaired mobility History of fall(s)   No flowsheet data found.      Outpatient Encounter Medications as of 04/28/2020  Medication Sig  . acetaminophen (TYLENOL) 325 MG tablet Take 650 mg by mouth every 4 (four) hours as needed. per standing order for pain/fever DO NOT EXCEED >3,000 mg in 24 hours  . amLODipine (NORVASC) 5 MG tablet Take 5 mg by mouth daily.   [name] atorvastatin (LIPITOR) 40 MG tablet Take 40 mg by mouth at bedtime.   . baclofen (LIORESAL) 10 MG tablet Take 1 tablet by mouth 2 (two) times daily. Hold for sedation  . FLUoxetine (PROZAC) 20 MG/5ML solution Take 30 mg by mouth daily.   . Glucerna (GLUCERNA) LIQD Take 237 mLs by mouth daily.  . hydrocortisone cream (PREPARATION H) 1 % Apply rectally twice daily and as needed for hemorrhoids  . Insulin Glargine (LANTUS SOLOSTAR) 100 UNIT/ML Solostar Pen Inject 20 Units into the skin daily.  . insulin lispro (HUMALOG) 100 UNIT/ML cartridge Inject 10 Units into the skin 3 (three) times daily before meals. Give 10 units SQ with meals if Blood Sugar >150  . lansoprazole (PREVACID SOLUTAB) 30 MG disintegrating tablet Take 30 mg by mouth daily. Give before meals  . Melatonin 1 MG TABS Take 2  tablets by mouth at bedtime.   . metoprolol tartrate (LOPRESSOR) 25 MG tablet Take 12.5 mg by mouth 2 (two) times daily.   . NON FORMULARY Diet Type:  Dysphagia 3, thin no straws  . Pramoxine-Calamine (AVEENO ANTI-ITCH) 1-3 % LOTN Apply to the extremities and back daily and prn for itching/ dry skin. Do not apply to the buttocks wound  . valsartan (DIOVAN) 40 MG tablet Take 40 mg by mouth 2 (two) times daily.    No facility-administered encounter medications on file as of 04/28/2020.     Vitals:   04/28/20 0951  BP: (!) 164/92  Pulse: (!) 53  Resp: 20  Temp: (!) 97.5 F (36.4 C)  TempSrc: Oral  SpO2: 98%  Weight: 162 lb 3.2 oz (73.6 kg)  Height: 5' 11"  (1.803  m)   Body mass index is 22.62 kg/m.  DIAGNOSTIC EXAMS    LABS REVIEWED PREVIOUS:   05-23-19: wbc 7.0; hgb 14.6; hct 47.4; mcv 93.1; plt 290; glucose 202; bun 30; creat 1.51; k+ 4.0; na++ 140; ca 8.7; ast 50 alt 50 alk phos 131; albumin 2.6 . Hgb a1c 7.5  06-06-19: glucose 85; bun 37; creat 1.50; k+ 4.0; na++ 141; ca 8.8; liver normal albumin 2.9  09-06-19: hgb a1c 6.4 10-07-19: chol 102;ldl 63; trig 81; hdl 23; mag 2.0 PSA 0.36 10-31-19: hemoccult: neg; hep C neg 11-01-19: hemoccult: neg 11-02-19: hemoccult: neg  01-15-20: wbc 7.7; hgb 16.5; hct 51.4; mcv 93.1 plt 188; glucose 99; bun 32; creat 1.49; k+ 4.2; na++ 142; ca 8.9; liver normal albumin 3.1; hgb a1c 6.5   NO NEW LABS   Review of Systems  Reason unable to perform ROS: expressive aphasia      Physical Exam Constitutional:      General: He is not in acute distress.    Appearance: He is well-developed. He is not diaphoretic.  HENT:     Head: Atraumatic.     Nose: Nose normal.     Mouth/Throat:     Mouth: Mucous membranes are moist.     Pharynx: Oropharynx is clear.  Eyes:     Conjunctiva/sclera: Conjunctivae normal.  Neck:     Thyroid: No thyromegaly.  Cardiovascular:     Rate and Rhythm: Normal rate and regular rhythm.     Pulses: Normal pulses.      Heart sounds: Normal heart sounds.  Pulmonary:     Effort: Pulmonary effort is normal. No respiratory distress.     Breath sounds: Normal breath sounds.  Abdominal:     General: Bowel sounds are normal. There is no distension.     Palpations: Abdomen is soft.     Tenderness: There is no abdominal tenderness.  Genitourinary:    Comments:  Foley present  Penile tear present Musculoskeletal:     Cervical back: Neck supple.     Right lower leg: No edema.     Left lower leg: No edema.     Comments: Right hemiparesis   Lymphadenopathy:     Cervical: No cervical adenopathy.  Skin:    General: Skin is warm and dry.  Neurological:     Mental Status: He is alert. Mental status is at baseline.  Psychiatric:        Mood and Affect: Mood normal.      ASSESSMENT/ PLAN:  TODAY:   1. Nontraumatic subcortical hemorrhage of left cerebral hemisphere/ hemorrhagic cerebrovascular accident: is stable will continue baclofen 10 mg twice daily for spasticity  2. CAD native artery native heart without angina: is status post CABG 2013: is stable will continue lopressor 12.5 mg twice daily   3. hypertension associated with diabetes mellitus: is stable b/p 164/92 will continue norvasc 5 mg daily and lopressor 12.5 mg twice daily diovan 40 mg twice daily   4. Dyslipidemia associated with type 2 diabetes mellitus; is stable LDL 58 will continue lipitor 40 mg daily   5. Microalbuminuria due to type 2 diabetes mellitus: is without change is 2236; is on ARB will monitor   6. Major depression with psychotic features; is stable will continue prozac 30 mg daily is off antipsychotics   7. CKD stage 3 due to type 2 diabetes mellitus: is stable bun32; creat 1.49 will monitor   8 Chronic urine retention: is without change has long term foley  followed by urology  9. Type 2 diabetes mellitus with peripheral vascular disease: hgb a1c 6.5 will continue lantus 25 units nightly and novolog 10 units with meals.    10. Protein calorie malnutrition severe: is stable weight is 162 pounds albumin 3.1 will monitor   Will get urine for micro-albumin     Ok Edwards NP [facility]  Contact [phone] Monday through Friday 8am- 5pm  After hours call [phone]

## 2020-05-07 ENCOUNTER — Other Ambulatory Visit (HOSPITAL_COMMUNITY)
Admission: RE | Admit: 2020-05-07 | Discharge: 2020-05-07 | Disposition: A | Discharge status: Home / Self Care | Payer: Medicare PPO | Source: Skilled Nursing Facility | Attending: Adult Health | Admitting: Adult Health

## 2020-05-07 DIAGNOSIS — E1165 Type 2 diabetes mellitus with hyperglycemia: Secondary | ICD-10-CM | POA: Insufficient documentation

## 2020-05-08 LAB — MICROALBUMIN, URINE: Microalb, Ur: 413.2 ug/mL — ABNORMAL HIGH

## 2020-05-18 ENCOUNTER — Other Ambulatory Visit (HOSPITAL_COMMUNITY)
Admission: RE | Admit: 2020-05-18 | Discharge: 2020-05-18 | Disposition: A | Discharge status: Home / Self Care | Payer: Medicare PPO | Source: Skilled Nursing Facility | Attending: Adult Health | Admitting: Adult Health

## 2020-05-18 DIAGNOSIS — I1310 Hypertensive heart and chronic kidney disease without heart failure, with stage 1 through stage 4 chronic kidney disease, or unspecified chronic kidney disease: Secondary | ICD-10-CM | POA: Insufficient documentation

## 2020-05-18 LAB — COMPREHENSIVE METABOLIC PANEL
ALT: 15 U/L (ref 0–44)
AST: 17 U/L (ref 15–41)
Albumin: 2.5 g/dL — ABNORMAL LOW (ref 3.5–5.0)
Alkaline Phosphatase: 142 U/L — ABNORMAL HIGH (ref 38–126)
Anion gap: 7 (ref 5–15)
BUN: 32 mg/dL — ABNORMAL HIGH (ref 8–23)
CO2: 28 mmol/L (ref 22–32)
Calcium: 8.6 mg/dL — ABNORMAL LOW (ref 8.9–10.3)
Chloride: 105 mmol/L (ref 98–111)
Creatinine, Ser: 1.6 mg/dL — ABNORMAL HIGH (ref 0.61–1.24)
GFR calc Af Amer: 48 mL/min — ABNORMAL LOW (ref >60)
GFR calc non Af Amer: 42 mL/min — ABNORMAL LOW (ref >60)
Glucose, Bld: 128 mg/dL — ABNORMAL HIGH (ref 70–99)
Potassium: 4.3 mmol/L (ref 3.5–5.1)
Sodium: 140 mmol/L (ref 135–145)
Total Bilirubin: 0.9 mg/dL (ref 0.3–1.2)
Total Protein: 6.1 g/dL — ABNORMAL LOW (ref 6.5–8.1)

## 2020-05-18 LAB — CBC WITH DIFFERENTIAL/PLATELET
Abs Immature Granulocytes: 0.02 10*3/uL (ref 0.00–0.07)
Basophils Absolute: 0 10*3/uL (ref 0.0–0.1)
Basophils Relative: 0 %
Eosinophils Absolute: 0.3 10*3/uL (ref 0.0–0.5)
Eosinophils Relative: 4 %
HCT: 45.8 % (ref 39.0–52.0)
Hemoglobin: 14.8 g/dL (ref 13.0–17.0)
Immature Granulocytes: 0 %
Lymphocytes Relative: 19 %
Lymphs Abs: 1.7 10*3/uL (ref 0.7–4.0)
MCH: 29.9 pg (ref 26.0–34.0)
MCHC: 32.3 g/dL (ref 30.0–36.0)
MCV: 92.5 fL (ref 80.0–100.0)
Monocytes Absolute: 0.7 10*3/uL (ref 0.1–1.0)
Monocytes Relative: 7 %
Neutro Abs: 6.4 10*3/uL (ref 1.7–7.7)
Neutrophils Relative %: 70 %
Platelets: 270 10*3/uL (ref 150–400)
RBC: 4.95 MIL/uL (ref 4.22–5.81)
RDW: 12.7 % (ref 11.5–15.5)
WBC: 9.2 10*3/uL (ref 4.0–10.5)
nRBC: 0 % (ref 0.0–0.2)

## 2020-05-19 ENCOUNTER — Encounter: Payer: Self-pay | Admitting: Adult Health

## 2020-05-19 ENCOUNTER — Other Ambulatory Visit (HOSPITAL_COMMUNITY)
Admission: RE | Admit: 2020-05-19 | Discharge: 2020-05-19 | Disposition: A | Discharge status: Home / Self Care | Payer: Medicare PPO | Source: Skilled Nursing Facility | Attending: Adult Health | Admitting: Adult Health

## 2020-05-19 ENCOUNTER — Non-Acute Institutional Stay (SKILLED_NURSING_FACILITY): Payer: Medicare PPO | Admitting: Adult Health

## 2020-05-19 DIAGNOSIS — I1310 Hypertensive heart and chronic kidney disease without heart failure, with stage 1 through stage 4 chronic kidney disease, or unspecified chronic kidney disease: Secondary | ICD-10-CM | POA: Diagnosis not present

## 2020-05-19 DIAGNOSIS — N179 Acute kidney failure, unspecified: Secondary | ICD-10-CM | POA: Diagnosis not present

## 2020-05-19 LAB — COMPREHENSIVE METABOLIC PANEL
ALT: 13 U/L (ref 0–44)
AST: 14 U/L — ABNORMAL LOW (ref 15–41)
Albumin: 2.3 g/dL — ABNORMAL LOW (ref 3.5–5.0)
Alkaline Phosphatase: 145 U/L — ABNORMAL HIGH (ref 38–126)
Anion gap: 7 (ref 5–15)
BUN: 34 mg/dL — ABNORMAL HIGH (ref 8–23)
CO2: 28 mmol/L (ref 22–32)
Calcium: 8.2 mg/dL — ABNORMAL LOW (ref 8.9–10.3)
Chloride: 105 mmol/L (ref 98–111)
Creatinine, Ser: 1.79 mg/dL — ABNORMAL HIGH (ref 0.61–1.24)
GFR calc Af Amer: 42 mL/min — ABNORMAL LOW (ref >60)
GFR calc non Af Amer: 36 mL/min — ABNORMAL LOW (ref >60)
Glucose, Bld: 109 mg/dL — ABNORMAL HIGH (ref 70–99)
Potassium: 4 mmol/L (ref 3.5–5.1)
Sodium: 140 mmol/L (ref 135–145)
Total Bilirubin: 0.8 mg/dL (ref 0.3–1.2)
Total Protein: 5.9 g/dL — ABNORMAL LOW (ref 6.5–8.1)

## 2020-05-19 NOTE — Progress Notes (Signed)
Location:  [place] Room Number: 845-X Place of Service:  SNF (31)   CODE STATUS: FULL CODE  No Known Allergies  Chief Complaint  Patient presents with  . Acute Visit    Patient is seen to followup on labs.     HPI:  His renal function is worse with bun 34 creat 1.79. his po intake has been poor. He is less engaging with his surroundings. There are reports of fevers present.   Past Medical History:  Diagnosis Date  . A-fib ([place])    a. Post-op afib after CABG 08/2012.  [name] Acute gastric ulcer with hemorrhage   . Acute respiratory failure with hypoxia ([place])   . Aphasia following cerebral infarction   . Atrial fibrillation ([place])   . CAD (coronary artery disease)    a. NSTEMI s/p stent to distal RCA 03/2000. b. Inf MI s/p emergent thrombectomy/stenting mid RCA 10/2000. c. NSTEMI s/p CABGx4 (LIMA-LAD, SVG-OM2, seq SVG-acute marginal and PD) 05/16/67 - course complicated by confusion, post-op AF, L pleural effusion with thoracentesis. d. Normal LV function by echo 08/2012.  . Diabetes mellitus   . Diverticulosis   . Dysphagia following cerebral infarction   . Encephalopathy   . Extended spectrum beta lactamase (ESBL) resistance   . Generalized anxiety disorder   . GERD (gastroesophageal reflux disease)   . Hemiplegia and hemiparesis following cerebral infarction affecting right dominant side ([place])   . Hiatal hernia   . HLD (hyperlipidemia)   . HTN (hypertension)   . Non-ST elevation (NSTEMI) myocardial infarction ([place])   . Nontraumatic intracerebral hemorrhage in hemisphere, subcortical ([place])   . Peripheral vascular disease ([place])    a. Carotid dopplers neg 08/13/12. b. Pre-cabg ABIs - R=0.87 suggesting mild dz, L=1.29 possibly falsely elevated due to calcified vessels.  . Pleural effusion    a. L pleural eff after CABG s/p thoracentesis 08/22/12.  . Prostate cancer [facility])    Status post radiation treatment.  . Prostatic hypertrophy    a. Hx of urinary  retention, awaiting TURP.  [name] Severe protein-calorie malnutrition ([place])   . Tobacco abuse   . 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 Valvular heart disease    a. Mild  MR by TEE 08/2012.    Past Surgical History:  Procedure Laterality Date  . APPENDECTOMY    . BACK SURGERY    . CORONARY ARTERY BYPASS GRAFT  08/16/2012   Procedure: CORONARY ARTERY BYPASS GRAFTING (CABG);  Surgeon: Melrose Nakayama, MD;  Location: [place];  Service: Open Heart Surgery;  Laterality: N/A;  . IR REPLC GASTRO/COLONIC TUBE PERCUT W/FLUORO  02/13/2019  . LEFT HEART CATHETERIZATION WITH CORONARY ANGIOGRAM N/A 08/14/2012   Procedure: LEFT HEART CATHETERIZATION WITH CORONARY ANGIOGRAM;  Surgeon: Peter M Martinique, MD;  Location: [facility] CATH LAB;  Service: Cardiovascular;  Laterality: N/A;    Social History   Socioeconomic History  . Marital status: Married    Spouse name: Not on file  . Number of children: Not on file  . Years of education: Not on file  . Highest education level: Not on file  Occupational History  . Occupation: retired   Tobacco Use  . Smoking status: Former Smoker    Quit date: 11/12/1972    Years since quitting: 47.5  . Smokeless tobacco: Never Used  Substance and Sexual Activity  . Alcohol use: No  . Drug use: No  . Sexual activity: Not Currently  Other Topics Concern  .  Not on file  Social History Narrative   He is a long term patient of [facility]    Social Determinants of Health   Financial Resource Strain: Low Risk   . Difficulty of Paying Living Expenses: Not hard at all  Food Insecurity: Unknown  . Worried About Charity fundraiser in the Last Year: Patient refused  . Ran Out of Food in the Last Year: Patient refused  Transportation Needs: Unknown  . Lack of Transportation (Medical): Patient refused  . Lack of Transportation (Non-Medical): Patient refused  Physical Activity: Inactive  . Days of Exercise per Week: 0 days  . Minutes of Exercise per Session: 0 min   Stress: Unknown  . Feeling of Stress : Patient refused  Social Connections: Unknown  . Frequency of Communication with Friends and Family: Patient refused  . Frequency of Social Gatherings with Friends and Family: Patient refused  . Attends Religious Services: Patient refused  . Active Member of Clubs or Organizations: Patient refused  . Attends Archivist Meetings: Patient refused  . Marital Status: Patient refused  Intimate Partner Violence: Unknown  . Fear of Current or Ex-Partner: Patient refused  . Emotionally Abused: Patient refused  . Physically Abused: Patient refused  . Sexually Abused: Patient refused   Family History  Problem Relation Age of Onset  . Hypertension Mother       VITAL SIGNS BP 137/72   Pulse (!) 51   Temp 97.8 F (36.6 C) (Oral)   Resp 20   Ht 5' 11"  (1.803 m)   Wt 161 lb 6.4 oz (73.2 kg)   SpO2 98%   BMI 22.51 kg/m   Outpatient Encounter Medications as of 05/19/2020  Medication Sig  . acetaminophen (TYLENOL) 325 MG tablet Take 650 mg by mouth every 4 (four) hours as needed. per standing order for pain/fever DO NOT EXCEED >3,000 mg in 24 hours  . amLODipine (NORVASC) 5 MG tablet Take 5 mg by mouth daily.   [name] atorvastatin (LIPITOR) 40 MG tablet Take 40 mg by mouth at bedtime.   . baclofen (LIORESAL) 10 MG tablet Take 1 tablet by mouth 2 (two) times daily. Hold for sedation  . Eyelid Cleansers (OCUSOFT LID SCRUB EX) Apply 1 each topically daily. Both eyes  . FLUoxetine (PROZAC) 20 MG/5ML solution Take 30 mg by mouth daily.   . Glucerna (GLUCERNA) LIQD Take 237 mLs by mouth daily.  . hydrocortisone cream (PREPARATION H) 1 % Apply rectally twice daily and as needed for hemorrhoids  . Insulin Glargine (LANTUS SOLOSTAR) 100 UNIT/ML Solostar Pen Inject 20 Units into the skin daily.  . insulin lispro (HUMALOG) 100 UNIT/ML cartridge Inject 10 Units into the skin 3 (three) times daily before meals. Give 10 units SQ with meals if Blood Sugar >150   . lansoprazole (PREVACID SOLUTAB) 30 MG disintegrating tablet Take 30 mg by mouth daily. Give before meals  . Melatonin 1 MG TABS Take 2 tablets by mouth at bedtime.   . metoprolol tartrate (LOPRESSOR) 25 MG tablet Take 12.5 mg by mouth 2 (two) times daily.   . NON FORMULARY Diet Type:  Dysphagia 3, thin no straws  . Pramoxine-Calamine (AVEENO ANTI-ITCH) 1-3 % LOTN Apply to the extremities and back daily and prn for itching/ dry skin. Do not apply to the buttocks wound  . sodium chloride 0.9 % infusion Inject 75 mLs into the vein. 75 mL/hour x2 liters  . valsartan (DIOVAN) 40 MG tablet Take 40 mg by mouth 2 (two)  times daily.    No facility-administered encounter medications on file as of 05/19/2020.     SIGNIFICANT DIAGNOSTIC EXAMS   LABS REVIEWED PREVIOUS:   05-23-19: wbc 7.0; hgb 14.6; hct 47.4; mcv 93.1; plt 290; glucose 202; bun 30; creat 1.51; k+ 4.0; na++ 140; ca 8.7; ast 50 alt 50 alk phos 131; albumin 2.6 . Hgb a1c 7.5  06-06-19: glucose 85; bun 37; creat 1.50; k+ 4.0; na++ 141; ca 8.8; liver normal albumin 2.9  09-06-19: hgb a1c 6.4 10-07-19: chol 102;ldl 63; trig 81; hdl 23; mag 2.0 PSA 0.36 10-31-19: hemoccult: neg; hep C neg 11-01-19: hemoccult: neg 11-02-19: hemoccult: neg  01-15-20: wbc 7.7; hgb 16.5; hct 51.4; mcv 93.1 plt 188; glucose 99; bun 32; creat 1.49; k+ 4.2; na++ 142; ca 8.9; liver normal albumin 3.1; hgb a1c 6.5   TODAY  05-18-20: wbc 9.2; hgb 14.8; hct 45.8; mcv 92.5 plt 270; glucose 128; bun 32; creat 1.60 ;k+ 4.3; na++ 140; ca 8.6 alk phos 142 albumin 2.5 05-19-20: glucose 109; bun 34; creat 1.79; k+ 4.0; na++ 140; ca 8.2 alk phos 145; albumin 2.3    Review of Systems  Reason unable to perform ROS: expressive aphasia     Physical Exam Constitutional:      General: He is not in acute distress.    Appearance: He is well-developed. He is not diaphoretic.  Neck:     Thyroid: No thyromegaly.  Cardiovascular:     Rate and Rhythm: Normal rate and regular  rhythm.     Pulses: Normal pulses.     Heart sounds: Normal heart sounds.  Pulmonary:     Effort: Pulmonary effort is normal. No respiratory distress.     Breath sounds: Normal breath sounds.  Abdominal:     General: Bowel sounds are normal. There is no distension.     Palpations: Abdomen is soft.     Tenderness: There is no abdominal tenderness.  Genitourinary:    Comments: Foley present  Penile tear present Musculoskeletal:     Cervical back: Neck supple.     Right lower leg: No edema.     Left lower leg: No edema.     Comments: Right hemiparesis   Lymphadenopathy:     Cervical: No cervical adenopathy.  Skin:    General: Skin is warm and dry.  Neurological:     Mental Status: He is alert. Mental status is at baseline.  Psychiatric:        Mood and Affect: Mood normal.       ASSESSMENT/ PLAN:  TODAY  1. Acute renal failure: is worse: will begin NS at 75 cc per hour for 2 liters will then repeat BMP   MD is aware of resident's narcotic use and is in agreement with current plan of care. We will attempt to wean resident as appropriate.  Ok Edwards NP [facility]  Contact [phone] Monday through Friday 8am- 5pm  After hours call [phone]

## 2020-05-20 DIAGNOSIS — N179 Acute kidney failure, unspecified: Secondary | ICD-10-CM | POA: Insufficient documentation

## 2020-05-22 ENCOUNTER — Other Ambulatory Visit (HOSPITAL_COMMUNITY)
Admission: RE | Admit: 2020-05-22 | Discharge: 2020-05-22 | Disposition: A | Discharge status: Home / Self Care | Payer: Medicare PPO | Source: Skilled Nursing Facility | Attending: Adult Health | Admitting: Adult Health

## 2020-05-22 ENCOUNTER — Encounter: Payer: Self-pay | Admitting: Adult Health

## 2020-05-22 ENCOUNTER — Non-Acute Institutional Stay (SKILLED_NURSING_FACILITY): Payer: Medicare PPO | Admitting: Adult Health

## 2020-05-22 DIAGNOSIS — G8111 Spastic hemiplegia affecting right dominant side: Secondary | ICD-10-CM | POA: Diagnosis not present

## 2020-05-22 DIAGNOSIS — E1159 Type 2 diabetes mellitus with other circulatory complications: Secondary | ICD-10-CM | POA: Diagnosis not present

## 2020-05-22 DIAGNOSIS — I2510 Atherosclerotic heart disease of native coronary artery without angina pectoris: Secondary | ICD-10-CM | POA: Diagnosis not present

## 2020-05-22 DIAGNOSIS — I10 Essential (primary) hypertension: Secondary | ICD-10-CM | POA: Diagnosis not present

## 2020-05-22 DIAGNOSIS — I615 Nontraumatic intracerebral hemorrhage, intraventricular: Secondary | ICD-10-CM

## 2020-05-22 DIAGNOSIS — I1310 Hypertensive heart and chronic kidney disease without heart failure, with stage 1 through stage 4 chronic kidney disease, or unspecified chronic kidney disease: Secondary | ICD-10-CM | POA: Insufficient documentation

## 2020-05-22 DIAGNOSIS — I610 Nontraumatic intracerebral hemorrhage in hemisphere, subcortical: Secondary | ICD-10-CM

## 2020-05-22 DIAGNOSIS — I152 Hypertension secondary to endocrine disorders: Secondary | ICD-10-CM

## 2020-05-22 DIAGNOSIS — F5105 Insomnia due to other mental disorder: Secondary | ICD-10-CM | POA: Diagnosis not present

## 2020-05-22 DIAGNOSIS — F333 Major depressive disorder, recurrent, severe with psychotic symptoms: Secondary | ICD-10-CM | POA: Diagnosis not present

## 2020-05-22 LAB — BASIC METABOLIC PANEL
Anion gap: 7 (ref 5–15)
BUN: 26 mg/dL — ABNORMAL HIGH (ref 8–23)
CO2: 30 mmol/L (ref 22–32)
Calcium: 8.5 mg/dL — ABNORMAL LOW (ref 8.9–10.3)
Chloride: 103 mmol/L (ref 98–111)
Creatinine, Ser: 1.58 mg/dL — ABNORMAL HIGH (ref 0.61–1.24)
GFR calc Af Amer: 49 mL/min — ABNORMAL LOW (ref >60)
GFR calc non Af Amer: 42 mL/min — ABNORMAL LOW (ref >60)
Glucose, Bld: 84 mg/dL (ref 70–99)
Potassium: 4.7 mmol/L (ref 3.5–5.1)
Sodium: 140 mmol/L (ref 135–145)

## 2020-05-22 NOTE — Progress Notes (Signed)
Location:    [place] Room Number: 130/P Place of Service:  SNF (31)   CODE STATUS: Full Code  No Known Allergies  Chief Complaint  Patient presents with  . Medical Management of Chronic Issues          Nontraumatic subcortical hemorrhage of left cerebral hemisphere/ hemorrhagic cerebrovascular accident:    CAD native artery native heart without angina:   hypertension associated with diabetes mellitus    HPI:  He is a 74 year old long term resident of this facility being seen for the management of his chronic illnesses: cva; cad; hypertension. He had been on IVF for his acute renal failure; however; his labs have not improved as much as they should have. Will need to repeat his ivf. There are no reports of uncontrolled pain. He is more responsive today. He has eaten today.   Past Medical History:  Diagnosis Date  . A-fib ([place])    a. Post-op afib after CABG 08/2012.  [name] Acute gastric ulcer with hemorrhage   . Acute respiratory failure with hypoxia ([place])   . Aphasia following cerebral infarction   . Atrial fibrillation ([place])   . CAD (coronary artery disease)    a. NSTEMI s/p stent to distal RCA 03/2000. b. Inf MI s/p emergent thrombectomy/stenting mid RCA 10/2000. c. NSTEMI s/p CABGx4 (LIMA-LAD, SVG-OM2, seq SVG-acute marginal and PD) 03/18/41 - course complicated by confusion, post-op AF, L pleural effusion with thoracentesis. d. Normal LV function by echo 08/2012.  . Diabetes mellitus   . Diverticulosis   . Dysphagia following cerebral infarction   . Encephalopathy   . Extended spectrum beta lactamase (ESBL) resistance   . Generalized anxiety disorder   . GERD (gastroesophageal reflux disease)   . Hemiplegia and hemiparesis following cerebral infarction affecting right dominant side ([place])   . Hiatal hernia   . HLD (hyperlipidemia)   . HTN (hypertension)   . Non-ST elevation (NSTEMI) myocardial infarction ([place])   . Nontraumatic intracerebral hemorrhage in  hemisphere, subcortical ([place])   . Peripheral vascular disease ([place])    a. Carotid dopplers neg 08/13/12. b. Pre-cabg ABIs - R=0.87 suggesting mild dz, L=1.29 possibly falsely elevated due to calcified vessels.  . Pleural effusion    a. L pleural eff after CABG s/p thoracentesis 08/22/12.  . Prostate cancer [facility])    Status post radiation treatment.  . Prostatic hypertrophy    a. Hx of urinary retention, awaiting TURP.  [name] Severe protein-calorie malnutrition ([place])   . Tobacco abuse   . 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 Valvular heart disease    a. Mild  MR by TEE 08/2012.    Past Surgical History:  Procedure Laterality Date  . APPENDECTOMY    . BACK SURGERY    . CORONARY ARTERY BYPASS GRAFT  08/16/2012   Procedure: CORONARY ARTERY BYPASS GRAFTING (CABG);  Surgeon: Melrose Nakayama, MD;  Location: [employer];  Service: Open Heart Surgery;  Laterality: N/A;  . IR REPLC GASTRO/COLONIC TUBE PERCUT W/FLUORO  02/13/2019  . LEFT HEART CATHETERIZATION WITH CORONARY ANGIOGRAM N/A 08/14/2012   Procedure: LEFT HEART CATHETERIZATION WITH CORONARY ANGIOGRAM;  Surgeon: Peter M Martinique, MD;  Location: [facility] CATH LAB;  Service: Cardiovascular;  Laterality: N/A;    Social History   Socioeconomic History  . Marital status: Married    Spouse name: Not on file  . Number of children: Not on file  . Years of education: Not on file  .  Highest education level: Not on file  Occupational History  . Occupation: retired   Tobacco Use  . Smoking status: Former Smoker    Quit date: 11/12/1972    Years since quitting: 47.5  . Smokeless tobacco: Never Used  Vaping Use  . Vaping Use: Never used  Substance and Sexual Activity  . Alcohol use: No  . Drug use: No  . Sexual activity: Not Currently  Other Topics Concern  . Not on file  Social History Narrative   He is a long term patient of [facility]    Social Determinants of Health   Financial Resource Strain: Low Risk   . Difficulty of Paying  Living Expenses: Not hard at all  Food Insecurity: Unknown  . Worried About Charity fundraiser in the Last Year: Patient refused  . Ran Out of Food in the Last Year: Patient refused  Transportation Needs: Unknown  . Lack of Transportation (Medical): Patient refused  . Lack of Transportation (Non-Medical): Patient refused  Physical Activity: Inactive  . Days of Exercise per Week: 0 days  . Minutes of Exercise per Session: 0 min  Stress: Unknown  . Feeling of Stress : Patient refused  Social Connections: Unknown  . Frequency of Communication with Friends and Family: Patient refused  . Frequency of Social Gatherings with Friends and Family: Patient refused  . Attends Religious Services: Patient refused  . Active Member of Clubs or Organizations: Patient refused  . Attends Archivist Meetings: Patient refused  . Marital Status: Patient refused  Intimate Partner Violence: Unknown  . Fear of Current or Ex-Partner: Patient refused  . Emotionally Abused: Patient refused  . Physically Abused: Patient refused  . Sexually Abused: Patient refused   Family History  Problem Relation Age of Onset  . Hypertension Mother       VITAL SIGNS BP (!) 151/81   Pulse 60   Temp 97.9 F (36.6 C) (Oral)   Resp 20   Ht 5' 11"  (1.803 m)   Wt 161 lb 6.4 oz (73.2 kg)   SpO2 100%   BMI 22.51 kg/m   Outpatient Encounter Medications as of 05/22/2020  Medication Sig  . acetaminophen (TYLENOL) 325 MG tablet Take 650 mg by mouth every 4 (four) hours as needed. per standing order for pain/fever DO NOT EXCEED >3,000 mg in 24 hours  . amLODipine (NORVASC) 5 MG tablet Take 5 mg by mouth daily.   [name] atorvastatin (LIPITOR) 40 MG tablet Take 40 mg by mouth at bedtime.   . baclofen (LIORESAL) 10 MG tablet Take 1 tablet by mouth 2 (two) times daily. Hold for sedation  . Eyelid Cleansers (OCUSOFT LID SCRUB EX) Apply 1 each topically daily. Both eyes  . FLUoxetine (PROZAC) 20 MG/5ML solution Take 30 mg  by mouth daily.   . Glucerna (GLUCERNA) LIQD Take 237 mLs by mouth daily.  . hydrocortisone cream (PREPARATION H) 1 % Apply rectally twice daily and as needed for hemorrhoids  . Insulin Glargine (LANTUS SOLOSTAR) 100 UNIT/ML Solostar Pen Inject 20 Units into the skin daily.  . insulin lispro (HUMALOG) 100 UNIT/ML cartridge Inject 10 Units into the skin 3 (three) times daily before meals. Give 10 units SQ with meals if Blood Sugar >150  . lansoprazole (PREVACID SOLUTAB) 30 MG disintegrating tablet Take 30 mg by mouth daily. Give before meals  . Melatonin 1 MG TABS Take 2 tablets by mouth at bedtime.   . metoprolol tartrate (LOPRESSOR) 25 MG tablet Take 12.5 mg  by mouth 2 (two) times daily.   . NON FORMULARY Diet Type:  Dysphagia 3, thin no straws  . Pramoxine-Calamine (AVEENO ANTI-ITCH) 1-3 % LOTN Apply to the extremities and back daily and prn for itching/ dry skin. Do not apply to the buttocks wound  . sodium chloride 0.9 % infusion Inject 75 mLs into the vein in the morning, at noon, and at bedtime. 75 mL/hour x2 liters For Acute Renal Failure  . valsartan (DIOVAN) 40 MG tablet Take 40 mg by mouth 2 (two) times daily.    No facility-administered encounter medications on file as of 05/22/2020.     SIGNIFICANT DIAGNOSTIC EXAMS   LABS REVIEWED PREVIOUS:   05-23-19: wbc 7.0; hgb 14.6; hct 47.4; mcv 93.1; plt 290; glucose 202; bun 30; creat 1.51; k+ 4.0; na++ 140; ca 8.7; ast 50 alt 50 alk phos 131; albumin 2.6 . Hgb a1c 7.5  06-06-19: glucose 85; bun 37; creat 1.50; k+ 4.0; na++ 141; ca 8.8; liver normal albumin 2.9  09-06-19: hgb a1c 6.4 10-07-19: chol 102;ldl 63; trig 81; hdl 23; mag 2.0 PSA 0.36 10-31-19: hemoccult: neg; hep C neg 11-01-19: hemoccult: neg 11-02-19: hemoccult: neg  01-15-20: wbc 7.7; hgb 16.5; hct 51.4; mcv 93.1 plt 188; glucose 99; bun 32; creat 1.49; k+ 4.2; na++ 142; ca 8.9; liver normal albumin 3.1; hgb a1c 6.5  05-07-20: urine for micro-albumin: 413.2 ( on ARB)  05-18-20:  wbc 9.2; hgb 14.8; hct 45.8; mcv 92.5 plt 270; glucose 128; bun 32; creat 1.60 ;k+ 4.3; na++ 140; ca 8.6 alk phos 142 albumin 2.5 05-19-20: glucose 109; bun 34; creat 1.79; k+ 4.0; na++ 140; ca 8.2 alk phos 145; albumin 2.3   TODAY  05-22-20: glucose 84; bun 26; creat 1.58; k+ 4.7; an++ 140; ca 8.5   Review of Systems  Reason unable to perform ROS: expresive aphasia     Physical Exam Constitutional:      General: He is not in acute distress.    Appearance: He is well-developed. He is not diaphoretic.  Neck:     Thyroid: No thyromegaly.  Cardiovascular:     Rate and Rhythm: Normal rate and regular rhythm.     Pulses: Normal pulses.     Heart sounds: Normal heart sounds.  Pulmonary:     Effort: Pulmonary effort is normal. No respiratory distress.     Breath sounds: Normal breath sounds.  Abdominal:     General: Bowel sounds are normal. There is no distension.     Palpations: Abdomen is soft.     Tenderness: There is no abdominal tenderness.  Genitourinary:    Comments: Foley present  Penile tear present Musculoskeletal:     Cervical back: Neck supple.     Right lower leg: No edema.     Left lower leg: No edema.     Comments: Right hemiparesis   Lymphadenopathy:     Cervical: No cervical adenopathy.  Skin:    General: Skin is warm and dry.  Neurological:     Mental Status: He is alert. Mental status is at baseline.  Psychiatric:        Mood and Affect: Mood normal.      ASSESSMENT/ PLAN:   TODAY:   1. Nontraumatic subcortical hemorrhage of left cerebral hemisphere/ hemorrhagic cerebrovascular accident right hemiparesis  is stable will continue baclofen 10 mg twice daily for spasticity  2. CAD native artery native heart without angina: is status post CABG 2013: is stable will continue lopressor 12.5 mg  twice daily   3. hypertension associated with diabetes mellitus: is stable b/p 151/81 will continue norvasc 5 mg daily and lopressor 12.5 mg twice daily diovan 40 mg  twice daily   4. AKI: is slightly better: will begin NS at 75 cc per hour for 2 liters.   PREVIOUS    4. Dyslipidemia associated with type 2 diabetes mellitus; is stable LDL 58 will continue lipitor 40 mg daily   5. Microalbuminuria due to type 2 diabetes mellitus: is without change 413.2; is on ARB will monitor   6. Major depression with psychotic features; is stable will continue prozac 30 mg daily is off antipsychotics   7. CKD stage 3 due to type 2 diabetes mellitus: is stable bun 26; creat 1.58 will monitor   8 Chronic urine retention: is without change has long term foley followed by urology  9. Type 2 diabetes mellitus with peripheral vascular disease: hgb a1c 6.5 will continue lantus 25 units nightly and novolog 10 units with meals.   10. Protein calorie malnutrition severe: is stable weight is 161 pounds albumin 3.1 will monitor     MD is aware of resident's narcotic use and is in agreement with current plan of care. We will attempt to wean resident as appropriate.  Ok Edwards NP [facility] Adult Medicine  Contact [phone] Monday through Friday 8am- 5pm  After hours call [phone]

## 2020-05-25 ENCOUNTER — Encounter (HOSPITAL_COMMUNITY)
Admission: RE | Admit: 2020-05-25 | Discharge: 2020-05-25 | Disposition: A | Discharge status: Home / Self Care | Payer: Medicare PPO | Source: Skilled Nursing Facility | Attending: Radiology | Admitting: Radiology

## 2020-05-25 DIAGNOSIS — I1310 Hypertensive heart and chronic kidney disease without heart failure, with stage 1 through stage 4 chronic kidney disease, or unspecified chronic kidney disease: Secondary | ICD-10-CM | POA: Diagnosis not present

## 2020-05-25 LAB — BASIC METABOLIC PANEL
Anion gap: 9 (ref 5–15)
BUN: 23 mg/dL (ref 8–23)
CO2: 27 mmol/L (ref 22–32)
Calcium: 8.1 mg/dL — ABNORMAL LOW (ref 8.9–10.3)
Chloride: 103 mmol/L (ref 98–111)
Creatinine, Ser: 1.42 mg/dL — ABNORMAL HIGH (ref 0.61–1.24)
GFR calc Af Amer: 56 mL/min — ABNORMAL LOW (ref >60)
GFR calc non Af Amer: 48 mL/min — ABNORMAL LOW (ref >60)
Glucose, Bld: 96 mg/dL (ref 70–99)
Potassium: 4 mmol/L (ref 3.5–5.1)
Sodium: 139 mmol/L (ref 135–145)

## 2020-05-27 ENCOUNTER — Non-Acute Institutional Stay: Payer: Self-pay | Admitting: Adult Health

## 2020-05-27 ENCOUNTER — Encounter: Payer: Self-pay | Admitting: Adult Health

## 2020-05-27 DIAGNOSIS — G8111 Spastic hemiplegia affecting right dominant side: Secondary | ICD-10-CM | POA: Insufficient documentation

## 2020-05-27 DIAGNOSIS — F323 Major depressive disorder, single episode, severe with psychotic features: Secondary | ICD-10-CM | POA: Diagnosis not present

## 2020-05-27 NOTE — Progress Notes (Signed)
Location:    [place] Room Number: 130/P Place of Service:  SNF (31)   CODE STATUS: Full Code  No Known Allergies  Chief Complaint  Patient presents with   Acute Visit    Medication Review     HPI:  We have come together for his medication review. He is presently taking prozac 30 mg daily. He has failed one wean; he had withdrawn. He had been spending more time in bed. He did require IVF due to poor intake. There are no reports of agitation. His po intake has improved. He is due for another gradual dose reduction; however in light of the fact that he has recently failed wean. At this time he would suffered emotionally from another wean.   Past Medical History:  Diagnosis Date   A-fib [facility])    a. Post-op afib after CABG 08/2012.   Acute gastric ulcer with hemorrhage    Acute respiratory failure with hypoxia ([facility])    Aphasia following cerebral infarction    Atrial fibrillation ([facility])    CAD (coronary artery disease)    a. NSTEMI s/p stent to distal RCA 03/2000. b. Inf MI s/p emergent thrombectomy/stenting mid RCA 10/2000. c. NSTEMI s/p CABGx4 (LIMA-LAD, SVG-OM2, seq SVG-acute marginal and PD) 0/2/40 - course complicated by confusion, post-op AF, L pleural effusion with thoracentesis. d. Normal LV function by echo 08/2012.   Diabetes mellitus    Diverticulosis    Dysphagia following cerebral infarction    Encephalopathy    Extended spectrum beta lactamase (ESBL) resistance    Generalized anxiety disorder    GERD (gastroesophageal reflux disease)    Hemiplegia and hemiparesis following cerebral infarction affecting right dominant side (HCC)    Hiatal hernia    HLD (hyperlipidemia)    HTN (hypertension)    Non-ST elevation (NSTEMI) myocardial infarction [facility])    Nontraumatic intracerebral hemorrhage in hemisphere, subcortical [facility])    Peripheral vascular disease (H[facility])    a. Carotid dopplers neg 08/13/12. b. Pre-cabg ABIs - R=0.87  suggesting mild dz, L=1.29 possibly falsely elevated due to calcified vessels.   Pleural effusion    a. L pleural eff after CABG s/p thoracentesis 08/22/12.   Prostate cancer [facility])    Status post radiation treatment.   Prostatic hypertrophy    a. Hx of urinary retention, awaiting TURP.   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 Mild  MR by TEE 08/2012.    Past Surgical History:  Procedure Laterality Date   APPENDECTOMY     BACK SURGERY     CORONARY ARTERY BYPASS GRAFT  08/16/2012   Procedure: CORONARY ARTERY BYPASS GRAFTING (CABG);  Surgeon: Melrose Nakayama, MD;  Location: [place];  Service: Open Heart Surgery;  Laterality: N/A;   IR REPLC GASTRO/COLONIC TUBE PERCUT W/FLUORO  02/13/2019   LEFT HEART CATHETERIZATION WITH CORONARY ANGIOGRAM N/A 08/14/2012   Procedure: LEFT HEART CATHETERIZATION WITH CORONARY ANGIOGRAM;  Surgeon: Peter M Martinique, MD;  Location: [facility] CATH LAB;  Service: Cardiovascular;  Laterality: N/A;    Social History   Socioeconomic History   Marital status: Married    Spouse name: Not on file   Number of children: Not on file   Years of education: Not on file   Highest education level: Not on file  Occupational History   Occupation: retired   Tobacco Use   Smoking  status: Former Smoker    Quit date: 11/12/1972    Years since quitting: 47.5   Smokeless tobacco: Never Used  Vaping Use   Vaping Use: Never used  Substance and Sexual Activity   Alcohol use: No   Drug use: No   Sexual activity: Not Currently  Other Topics Concern   Not on file  Social History Narrative   He is a long term patient of [facility]    Social Determinants of Health   Financial Resource Strain: Low Risk    Difficulty of Paying Living Expenses: Not hard at all  Food Insecurity: Unknown   Worried About Charity fundraiser in the Last Year: Patient refused    Arboriculturist in the Last Year: Patient refused  Transportation Needs: Unknown   Film/video editor (Medical): Patient refused   Lack of Transportation (Non-Medical): Patient refused  Physical Activity: Inactive   Days of Exercise per Week: 0 days   Minutes of Exercise per Session: 0 min  Stress: Unknown   Feeling of Stress : Patient refused  Social Connections: Unknown   Frequency of Communication with Friends and Family: Patient refused   Frequency of Social Gatherings with Friends and Family: Patient refused   Attends Religious Services: Patient refused   Marine scientist or Organizations: Patient refused   Attends Music therapist: Patient refused   Marital Status: Patient refused  Intimate Production manager Violence: Unknown   Fear of Current or Ex-Partner: Patient refused   Emotionally Abused: Patient refused   Physically Abused: Patient refused   Sexually Abused: Patient refused   Family History  Problem Relation Age of Onset   Hypertension Mother       VITAL SIGNS BP (!) 152/84    Pulse 64    Temp 97.9 F (36.6 C) (Oral)    Resp 20    Ht _0  (1.803 m)    Wt 161 lb 6.4 oz (73.2 kg)    SpO2 100%    BMI 22.51 kg/m   Outpatient Encounter Medications as of 05/27/2020  Medication Sig   acetaminophen (TYLENOL) 325 MG tablet Take 650 mg by mouth every 4 (four) hours as needed. per standing order for pain/fever DO NOT EXCEED >3,000 mg in 24 hours   amLODipine (NORVASC) 5 MG tablet Take 5 mg by mouth daily.    atorvastatin (LIPITOR) 40 MG tablet Take 40 mg by mouth at bedtime.    baclofen (LIORESAL) 10 MG tablet Take 1 tablet by mouth 2 (two) times daily. Hold for sedation   Eyelid Cleansers (OCUSOFT LID SCRUB EX) Apply 1 each topically daily. Both eyes   FLUoxetine (PROZAC) 20 MG/5ML solution Take 30 mg by mouth daily.    Glucerna (GLUCERNA) LIQD Take 237 mLs by mouth daily.   hydrocortisone cream (PREPARATION H) 1 % Apply rectally  twice daily and as needed for hemorrhoids   Insulin Glargine (LANTUS SOLOSTAR) 100 UNIT/ML Solostar Pen Inject 20 Units into the skin daily.   insulin lispro (HUMALOG) 100 UNIT/ML cartridge Inject 10 Units into the skin 3 (three) times daily before meals. Give 10 units SQ with meals if Blood Sugar >150   lansoprazole (PREVACID SOLUTAB) 30 MG disintegrating tablet Take 30 mg by mouth daily. Give before meals   Melatonin 1 MG TABS Take 2 tablets by mouth at bedtime.    metoprolol tartrate (LOPRESSOR) 25 MG tablet Take 12.5 mg by mouth 2 (two) times daily.    NON FORMULARY Diet Type:  Dysphagia 3, thin no straws   Pramoxine-Calamine (AVEENO ANTI-ITCH) 1-3 % LOTN Apply to the extremities and back daily and prn for itching/ dry skin. Do not apply to the buttocks wound   sodium chloride 0.9 % infusion Inject 75 mLs into the vein in the morning, at noon, and at bedtime. 75 mL/hour x2 liters For Acute Renal Failure   valsartan (DIOVAN) 40 MG tablet Take 40 mg by mouth 2 (two) times daily.    No facility-administered encounter medications on file as of 05/27/2020.     SIGNIFICANT DIAGNOSTIC EXAMS   LABS REVIEWED PREVIOUS:   05-23-19: wbc 7.0; hgb 14.6; hct 47.4; mcv 93.1; plt 290; glucose 202; bun 30; creat 1.51; k+ 4.0; na++ 140; ca 8.7; ast 50 alt 50 alk phos 131; albumin 2.6 . Hgb a1c 7.5  06-06-19: glucose 85; bun 37; creat 1.50; k+ 4.0; na++ 141; ca 8.8; liver normal albumin 2.9  09-06-19: hgb a1c 6.4 10-07-19: chol 102;ldl 63; trig 81; hdl 23; mag 2.0 PSA 0.36 10-31-19: hemoccult: neg; hep C neg 11-01-19: hemoccult: neg 11-02-19: hemoccult: neg  01-15-20: wbc 7.7; hgb 16.5; hct 51.4; mcv 93.1 plt 188; glucose 99; bun 32; creat 1.49; k+ 4.2; na++ 142; ca 8.9; liver normal albumin 3.1; hgb a1c 6.5  05-07-20: urine for micro-albumin: 413.2 ( on ARB)  05-18-20: wbc 9.2; hgb 14.8; hct 45.8; mcv 92.5 plt 270; glucose 128; bun 32; creat 1.60 ;k+ 4.3; na++ 140; ca 8.6 alk phos 142 albumin  2.5 05-19-20: glucose 109; bun 34; creat 1.79; k+ 4.0; na++ 140; ca 8.2 alk phos 145; albumin 2.3  05-22-20: glucose 84; bun 26; creat 1.58; k+ 4.7; na++ 140; ca 8.5   NO NEW LABS.   Review of Systems  Reason unable to perform ROS: expressive aphasia     Physical Exam Constitutional:      General: He is not in acute distress.    Appearance: He is well-developed. He is not diaphoretic.  Neck:     Thyroid: No thyromegaly.  Cardiovascular:     Rate and Rhythm: Normal rate and regular rhythm.     Pulses: Normal pulses.     Heart sounds: Normal heart sounds.  Pulmonary:     Effort: Pulmonary effort is normal. No respiratory distress.     Breath sounds: Normal breath sounds.  Abdominal:     General: Bowel sounds are normal. There is no distension.     Palpations: Abdomen is soft.     Tenderness: There is no abdominal tenderness.  Genitourinary:    Comments:  Foley present  Penile tear present Musculoskeletal:     Cervical back: Neck supple.     Right lower leg: No edema.     Left lower leg: No edema.     Comments: Right hemiparesis    Lymphadenopathy:     Cervical: No cervical adenopathy.  Skin:    General: Skin is warm and dry.  Neurological:     Mental Status: He is alert. Mental status is at baseline.  Psychiatric:        Mood and Affect: Mood normal.       ASSESSMENT/ PLAN:  TODAY  1. Major depression with psychotic features Is presently stable  Will continue prozac 30 mg daily     MD is aware of resident's narcotic use and is in agreement with current plan of care. We will attempt to wean resident as appropriate.  Ok Edwards NP [facility] Adult Medicine  Contact [phone] Monday through Friday  8am- 5pm  After hours call [phone]

## 2020-06-01 DIAGNOSIS — Z794 Long term (current) use of insulin: Secondary | ICD-10-CM | POA: Diagnosis not present

## 2020-06-01 DIAGNOSIS — B351 Tinea unguium: Secondary | ICD-10-CM | POA: Diagnosis not present

## 2020-06-01 DIAGNOSIS — E1151 Type 2 diabetes mellitus with diabetic peripheral angiopathy without gangrene: Secondary | ICD-10-CM | POA: Diagnosis not present

## 2020-06-01 DIAGNOSIS — E1140 Type 2 diabetes mellitus with diabetic neuropathy, unspecified: Secondary | ICD-10-CM | POA: Diagnosis not present

## 2020-06-11 ENCOUNTER — Encounter: Payer: Self-pay | Admitting: Adult Health

## 2020-06-11 ENCOUNTER — Non-Acute Institutional Stay (SKILLED_NURSING_FACILITY): Payer: Medicare PPO | Admitting: Adult Health

## 2020-06-11 DIAGNOSIS — E1151 Type 2 diabetes mellitus with diabetic peripheral angiopathy without gangrene: Secondary | ICD-10-CM | POA: Diagnosis not present

## 2020-06-11 DIAGNOSIS — I615 Nontraumatic intracerebral hemorrhage, intraventricular: Secondary | ICD-10-CM | POA: Diagnosis not present

## 2020-06-11 DIAGNOSIS — G8111 Spastic hemiplegia affecting right dominant side: Secondary | ICD-10-CM | POA: Diagnosis not present

## 2020-06-11 NOTE — Progress Notes (Signed)
Location:    [place] Room Number: 130/P Place of Service:  SNF (31)   CODE STATUS: Full Code  No Known Allergies  Chief Complaint  Patient presents with  . Acute Visit    Care Plan Meeting     HPI:  We have come together for her care plan meeting. Family present. Unable to do BIMS mood 0/30. He has had one fall without injury. His weight is stable. He continues to pull on his catheter. His wife states that he was tearful last week; missing his son. He is extensive to dependent on his adls . He is incontinent of bowel. He continues to be followed for his chronic illnesses including:  IVH (intraventricular hemorrhage)   Right spastic hemiparesis  Type 2 diabetes mellitus with peripheral vascular disease   Past Medical History:  Diagnosis Date  . A-fib ([place])    a. Post-op afib after CABG 08/2012.  [name] Acute gastric ulcer with hemorrhage   . Acute respiratory failure with hypoxia ([place])   . Aphasia following cerebral infarction   . Atrial fibrillation ([place])   . CAD (coronary artery disease)    a. NSTEMI s/p stent to distal RCA 03/2000. b. Inf MI s/p emergent thrombectomy/stenting mid RCA 10/2000. c. NSTEMI s/p CABGx4 (LIMA-LAD, SVG-OM2, seq SVG-acute marginal and PD) 07/20/31 - course complicated by confusion, post-op AF, L pleural effusion with thoracentesis. d. Normal LV function by echo 08/2012.  . Diabetes mellitus   . Diverticulosis   . Dysphagia following cerebral infarction   . Encephalopathy   . Extended spectrum beta lactamase (ESBL) resistance   . Generalized anxiety disorder   . GERD (gastroesophageal reflux disease)   . Hemiplegia and hemiparesis following cerebral infarction affecting right dominant side ([place])   . Hiatal hernia   . HLD (hyperlipidemia)   . HTN (hypertension)   . Non-ST elevation (NSTEMI) myocardial infarction ([place])   . Nontraumatic intracerebral hemorrhage in hemisphere, subcortical ([place])   . Peripheral vascular disease ([place])     a. Carotid dopplers neg 08/13/12. b. Pre-cabg ABIs - R=0.87 suggesting mild dz, L=1.29 possibly falsely elevated due to calcified vessels.  . Pleural effusion    a. L pleural eff after CABG s/p thoracentesis 08/22/12.  . Prostate cancer [facility])    Status post radiation treatment.  . Prostatic hypertrophy    a. Hx of urinary retention, awaiting TURP.  [name] Severe protein-calorie malnutrition ([place])   . Tobacco abuse   . 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 Valvular heart disease    a. Mild  MR by TEE 08/2012.    Past Surgical History:  Procedure Laterality Date  . APPENDECTOMY    . BACK SURGERY    . CORONARY ARTERY BYPASS GRAFT  08/16/2012   Procedure: CORONARY ARTERY BYPASS GRAFTING (CABG);  Surgeon: Melrose Nakayama, MD;  Location: [place];  Service: Open Heart Surgery;  Laterality: N/A;  . IR REPLC GASTRO/COLONIC TUBE PERCUT W/FLUORO  02/13/2019  . LEFT HEART CATHETERIZATION WITH CORONARY ANGIOGRAM N/A 08/14/2012   Procedure: LEFT HEART CATHETERIZATION WITH CORONARY ANGIOGRAM;  Surgeon: Peter M Martinique, MD;  Location: [facility] CATH LAB;  Service: Cardiovascular;  Laterality: N/A;    Social History   Socioeconomic History  . Marital status: Married    Spouse name: Not on file  . Number of children: Not on file  . Years of education: Not on file  . Highest education level: Not on file  Occupational History  .  Occupation: retired   Tobacco Use  . Smoking status: Former Smoker    Quit date: 11/12/1972    Years since quitting: 47.6  . Smokeless tobacco: Never Used  Vaping Use  . Vaping Use: Never used  Substance and Sexual Activity  . Alcohol use: No  . Drug use: No  . Sexual activity: Not Currently  Other Topics Concern  . Not on file  Social History Narrative   He is a long term patient of [facility]    Social Determinants of Health   Financial Resource Strain: Low Risk   . Difficulty of Paying Living Expenses: Not hard at all  Food Insecurity: Unknown  . Worried About  Charity fundraiser in the Last Year: Patient refused  . Ran Out of Food in the Last Year: Patient refused  Transportation Needs: Unknown  . Lack of Transportation (Medical): Patient refused  . Lack of Transportation (Non-Medical): Patient refused  Physical Activity: Inactive  . Days of Exercise per Week: 0 days  . Minutes of Exercise per Session: 0 min  Stress: Unknown  . Feeling of Stress : Patient refused  Social Connections: Unknown  . Frequency of Communication with Friends and Family: Patient refused  . Frequency of Social Gatherings with Friends and Family: Patient refused  . Attends Religious Services: Patient refused  . Active Member of Clubs or Organizations: Patient refused  . Attends Archivist Meetings: Patient refused  . Marital Status: Patient refused  Intimate Partner Violence: Unknown  . Fear of Current or Ex-Partner: Patient refused  . Emotionally Abused: Patient refused  . Physically Abused: Patient refused  . Sexually Abused: Patient refused   Family History  Problem Relation Age of Onset  . Hypertension Mother       VITAL SIGNS BP 124/76   Pulse (!) 54   Temp 97.6 F (36.4 C)   Resp 20   Ht 5' 11"  (1.803 m)   Wt 161 lb 6.4 oz (73.2 kg)   BMI 22.51 kg/m   Outpatient Encounter Medications as of 06/11/2020  Medication Sig  . acetaminophen (TYLENOL) 325 MG tablet Take 650 mg by mouth every 4 (four) hours as needed. per standing order for pain/fever DO NOT EXCEED >3,000 mg in 24 hours  . amLODipine (NORVASC) 5 MG tablet Take 5 mg by mouth daily.   [name] atorvastatin (LIPITOR) 40 MG tablet Take 40 mg by mouth at bedtime.   . baclofen (LIORESAL) 10 MG tablet Take 1 tablet by mouth 2 (two) times daily. Hold for sedation  . Eyelid Cleansers (OCUSOFT LID SCRUB EX) Apply 1 each topically daily. Both eyes  . FLUoxetine (PROZAC) 20 MG/5ML solution Take 30 mg by mouth daily.   . Glucerna (GLUCERNA) LIQD Take 237 mLs by mouth daily.  . hydrocortisone cream  (PREPARATION H) 1 % Apply rectally twice daily and as needed for hemorrhoids  . Insulin Glargine (LANTUS SOLOSTAR) 100 UNIT/ML Solostar Pen Inject 20 Units into the skin daily.  . insulin lispro (HUMALOG) 100 UNIT/ML cartridge Inject 10 Units into the skin 3 (three) times daily before meals. Give 10 units SQ with meals if Blood Sugar >150  . lansoprazole (PREVACID SOLUTAB) 30 MG disintegrating tablet Take 30 mg by mouth daily. Give before meals  . Melatonin 1 MG TABS Take 2 tablets by mouth at bedtime.   . metoprolol tartrate (LOPRESSOR) 25 MG tablet Take 12.5 mg by mouth 2 (two) times daily.   . NON FORMULARY Diet Type:  Dysphagia 3,  thin no straws  . Pramoxine-Calamine (AVEENO ANTI-ITCH) 1-3 % LOTN Apply to the extremities and back daily and prn for itching/ dry skin. Do not apply to the buttocks wound  . valsartan (DIOVAN) 40 MG tablet Take 40 mg by mouth 2 (two) times daily.   . [DISCONTINUED] sodium chloride 0.9 % infusion Inject 75 mLs into the vein in the morning, at noon, and at bedtime. 75 mL/hour x2 liters For Acute Renal Failure   No facility-administered encounter medications on file as of 06/11/2020.     SIGNIFICANT DIAGNOSTIC EXAMS   LABS REVIEWED PREVIOUS:    09-06-19: hgb a1c 6.4 10-07-19: chol 102;ldl 63; trig 81; hdl 23; mag 2.0 PSA 0.36 10-31-19: hemoccult: neg; hep C neg 11-01-19: hemoccult: neg 11-02-19: hemoccult: neg  01-15-20: wbc 7.7; hgb 16.5; hct 51.4; mcv 93.1 plt 188; glucose 99; bun 32; creat 1.49; k+ 4.2; na++ 142; ca 8.9; liver normal albumin 3.1; hgb a1c 6.5  05-07-20: urine for micro-albumin: 413.2 ( on ARB)  05-18-20: wbc 9.2; hgb 14.8; hct 45.8; mcv 92.5 plt 270; glucose 128; bun 32; creat 1.60 ;k+ 4.3; na++ 140; ca 8.6 alk phos 142 albumin 2.5 05-19-20: glucose 109; bun 34; creat 1.79; k+ 4.0; na++ 140; ca 8.2 alk phos 145; albumin 2.3  05-22-20: glucose 84; bun 26; creat 1.58; k+ 4.7; na++ 140; ca 8.5   NO NEW LABS.   Review of Systems  Reason unable to  perform ROS: expressive aphasia    Physical Exam Constitutional:      General: He is not in acute distress.    Appearance: He is well-developed. He is not diaphoretic.  Neck:     Thyroid: No thyromegaly.  Cardiovascular:     Rate and Rhythm: Normal rate and regular rhythm.     Pulses: Normal pulses.     Heart sounds: Normal heart sounds.  Pulmonary:     Effort: Pulmonary effort is normal. No respiratory distress.     Breath sounds: Normal breath sounds.  Abdominal:     General: Bowel sounds are normal. There is no distension.     Palpations: Abdomen is soft.     Tenderness: There is no abdominal tenderness.  Genitourinary:    Comments: Has foley  Penile tear  Musculoskeletal:     Cervical back: Neck supple.     Right lower leg: No edema.     Left lower leg: No edema.     Comments: Right hemiplegia   Lymphadenopathy:     Cervical: No cervical adenopathy.  Skin:    General: Skin is warm and dry.  Neurological:     Mental Status: He is alert. Mental status is at baseline.  Psychiatric:        Mood and Affect: Mood normal.       ASSESSMENT/ PLAN:  TODAY  1. IVH (intraventricular hemorrhage)  2. Right spastic hemiparesis 3. Type 2 diabetes mellitus with peripheral vascular disease   Will continue current medications Will continue current plan of care Will continue to monitor his status.   MD is aware of resident's narcotic use and is in agreement with current plan of care. We will attempt to wean resident as appropriate.  Ok Edwards NP [facility]  Contact [phone] Monday through Friday 8am- 5pm  After hours call [phone]

## 2020-06-19 ENCOUNTER — Non-Acute Institutional Stay (SKILLED_NURSING_FACILITY): Payer: Medicare PPO | Admitting: Adult Health

## 2020-06-19 ENCOUNTER — Encounter: Payer: Self-pay | Admitting: Adult Health

## 2020-06-19 DIAGNOSIS — E785 Hyperlipidemia, unspecified: Secondary | ICD-10-CM

## 2020-06-19 DIAGNOSIS — E1129 Type 2 diabetes mellitus with other diabetic kidney complication: Secondary | ICD-10-CM

## 2020-06-19 DIAGNOSIS — R809 Proteinuria, unspecified: Secondary | ICD-10-CM

## 2020-06-19 DIAGNOSIS — F323 Major depressive disorder, single episode, severe with psychotic features: Secondary | ICD-10-CM

## 2020-06-19 DIAGNOSIS — E1169 Type 2 diabetes mellitus with other specified complication: Secondary | ICD-10-CM

## 2020-06-19 NOTE — Progress Notes (Signed)
Location:    [place] Room Number: 130/P Place of Service:  SNF (31)   CODE STATUS: Full Code  No Known Allergies  Chief Complaint  Patient presents with  . Medical Management of Chronic Issues          Dyslipidemia associated with type 2 diabetes mellitus   Micro-albuminuria due to type 2 diabetes mellitus  Major depression with psychotic features    HPI:  He is a 74 year old long term resident of this facility being seen for the management of his chronic illnesses: dyslipidemia; microalbuminuria; depression. He has been tolerating his dose reduction without difficulty. There are no reports of uncontrolled pain; no reports of constipation present.   Past Medical History:  Diagnosis Date  . A-fib ([place])    a. Post-op afib after CABG 08/2012.  [name] Acute gastric ulcer with hemorrhage   . Acute respiratory failure with hypoxia ([place])   . Aphasia following cerebral infarction   . Atrial fibrillation ([place])   . CAD (coronary artery disease)    a. NSTEMI s/p stent to distal RCA 03/2000. b. Inf MI s/p emergent thrombectomy/stenting mid RCA 10/2000. c. NSTEMI s/p CABGx4 (LIMA-LAD, SVG-OM2, seq SVG-acute marginal and PD) 07/17/87 - course complicated by confusion, post-op AF, L pleural effusion with thoracentesis. d. Normal LV function by echo 08/2012.  . Diabetes mellitus   . Diverticulosis   . Dysphagia following cerebral infarction   . Encephalopathy   . Extended spectrum beta lactamase (ESBL) resistance   . Generalized anxiety disorder   . GERD (gastroesophageal reflux disease)   . Hemiplegia and hemiparesis following cerebral infarction affecting right dominant side ([place])   . Hiatal hernia   . HLD (hyperlipidemia)   . HTN (hypertension)   . Non-ST elevation (NSTEMI) myocardial infarction ([place])   . Nontraumatic intracerebral hemorrhage in hemisphere, subcortical ([place])   . Peripheral vascular disease ([place])    a. Carotid dopplers neg 08/13/12. b. Pre-cabg ABIs -  R=0.87 suggesting mild dz, L=1.29 possibly falsely elevated due to calcified vessels.  . Pleural effusion    a. L pleural eff after CABG s/p thoracentesis 08/22/12.  . Prostate cancer [facility])    Status post radiation treatment.  . Prostatic hypertrophy    a. Hx of urinary retention, awaiting TURP.  [name] Severe protein-calorie malnutrition ([place])   . Tobacco abuse   . 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 Valvular heart disease    a. Mild  MR by TEE 08/2012.    Past Surgical History:  Procedure Laterality Date  . APPENDECTOMY    . BACK SURGERY    . CORONARY ARTERY BYPASS GRAFT  08/16/2012   Procedure: CORONARY ARTERY BYPASS GRAFTING (CABG);  Surgeon: Melrose Nakayama, MD;  Location: [place];  Service: Open Heart Surgery;  Laterality: N/A;  . IR REPLC GASTRO/COLONIC TUBE PERCUT W/FLUORO  02/13/2019  . LEFT HEART CATHETERIZATION WITH CORONARY ANGIOGRAM N/A 08/14/2012   Procedure: LEFT HEART CATHETERIZATION WITH CORONARY ANGIOGRAM;  Surgeon: Peter M Martinique, MD;  Location: [facility] CATH LAB;  Service: Cardiovascular;  Laterality: N/A;    Social History   Socioeconomic History  . Marital status: Married    Spouse name: Not on file  . Number of children: Not on file  . Years of education: Not on file  . Highest education level: Not on file  Occupational History  . Occupation: retired   Tobacco Use  . Smoking status: Former Smoker    Quit date:  11/12/1972    Years since quitting: 47.6  . Smokeless tobacco: Never Used  Vaping Use  . Vaping Use: Never used  Substance and Sexual Activity  . Alcohol use: No  . Drug use: No  . Sexual activity: Not Currently  Other Topics Concern  . Not on file  Social History Narrative   He is a long term patient of [facility]    Social Determinants of Health   Financial Resource Strain: Low Risk   . Difficulty of Paying Living Expenses: Not hard at all  Food Insecurity: Unknown  . Worried About Charity fundraiser in the Last Year: Patient  refused  . Ran Out of Food in the Last Year: Patient refused  Transportation Needs: Unknown  . Lack of Transportation (Medical): Patient refused  . Lack of Transportation (Non-Medical): Patient refused  Physical Activity: Inactive  . Days of Exercise per Week: 0 days  . Minutes of Exercise per Session: 0 min  Stress: Unknown  . Feeling of Stress : Patient refused  Social Connections: Unknown  . Frequency of Communication with Friends and Family: Patient refused  . Frequency of Social Gatherings with Friends and Family: Patient refused  . Attends Religious Services: Patient refused  . Active Member of Clubs or Organizations: Patient refused  . Attends Archivist Meetings: Patient refused  . Marital Status: Patient refused  Intimate Partner Violence: Unknown  . Fear of Current or Ex-Partner: Patient refused  . Emotionally Abused: Patient refused  . Physically Abused: Patient refused  . Sexually Abused: Patient refused   Family History  Problem Relation Age of Onset  . Hypertension Mother       VITAL SIGNS BP (!) 157/80   Pulse (!) 52   Temp 97.9 F (36.6 C) (Oral)   Resp 20   Ht 5' 11"  (1.803 m)   Wt 154 lb 6.4 oz (70 kg)   SpO2 100%   BMI 21.53 kg/m   Outpatient Encounter Medications as of 06/19/2020  Medication Sig  . acetaminophen (TYLENOL) 325 MG tablet Take 650 mg by mouth every 4 (four) hours as needed. per standing order for pain/fever DO NOT EXCEED >3,000 mg in 24 hours  . amLODipine (NORVASC) 5 MG tablet Take 5 mg by mouth daily.   [name] atorvastatin (LIPITOR) 40 MG tablet Take 40 mg by mouth at bedtime.   . baclofen (LIORESAL) 10 MG tablet Take 1 tablet by mouth 2 (two) times daily. Hold for sedation  . Eyelid Cleansers (OCUSOFT LID SCRUB EX) Apply 1 each topically daily. Both eyes  . FLUoxetine (PROZAC) 20 MG/5ML solution Take 30 mg by mouth daily.   . Glucerna (GLUCERNA) LIQD Take 237 mLs by mouth daily.  . hydrocortisone cream (PREPARATION H) 1 %  Apply rectally twice daily and as needed for hemorrhoids  . Insulin Glargine (LANTUS SOLOSTAR) 100 UNIT/ML Solostar Pen Inject 20 Units into the skin daily.  . insulin lispro (HUMALOG) 100 UNIT/ML cartridge Inject 10 Units into the skin 3 (three) times daily before meals. Give 10 units SQ with meals if Blood Sugar >150  . lansoprazole (PREVACID SOLUTAB) 30 MG disintegrating tablet Take 30 mg by mouth daily. Give before meals  . Melatonin 1 MG TABS Take 2 tablets by mouth at bedtime.   . metoprolol tartrate (LOPRESSOR) 25 MG tablet Take 12.5 mg by mouth 2 (two) times daily.   . NON FORMULARY Diet Type:  Dysphagia 3, thin no straws  . Pramoxine-Calamine (AVEENO ANTI-ITCH) 1-3 % LOTN  Apply to the extremities and back daily and prn for itching/ dry skin. Do not apply to the buttocks wound  . valsartan (DIOVAN) 40 MG tablet Take 40 mg by mouth 2 (two) times daily.    No facility-administered encounter medications on file as of 06/19/2020.     SIGNIFICANT DIAGNOSTIC EXAMS   LABS REVIEWED PREVIOUS:    09-06-19: hgb a1c 6.4 10-07-19: chol 102;ldl 63; trig 81; hdl 23; mag 2.0 PSA 0.36 10-31-19: hemoccult: neg; hep C neg 11-01-19: hemoccult: neg 11-02-19: hemoccult: neg  01-15-20: wbc 7.7; hgb 16.5; hct 51.4; mcv 93.1 plt 188; glucose 99; bun 32; creat 1.49; k+ 4.2; na++ 142; ca 8.9; liver normal albumin 3.1; hgb a1c 6.5  05-07-20: urine for micro-albumin: 413.2 ( on ARB)  05-18-20: wbc 9.2; hgb 14.8; hct 45.8; mcv 92.5 plt 270; glucose 128; bun 32; creat 1.60 ;k+ 4.3; na++ 140; ca 8.6 alk phos 142 albumin 2.5 05-19-20: glucose 109; bun 34; creat 1.79; k+ 4.0; na++ 140; ca 8.2 alk phos 145; albumin 2.3  05-22-20: glucose 84; bun 26; creat 1.58; k+ 4.7; na++ 140; ca 8.5  TODAY  05-25-20: glucose 96; bun 23; creat 1.42; k+ 4.0; na++ 139; ca 8.1     Review of Systems  Reason unable to perform ROS: expressive aphasia    Physical Exam Constitutional:      General: He is not in acute distress.     Appearance: He is well-developed. He is not diaphoretic.  Neck:     Thyroid: No thyromegaly.  Cardiovascular:     Rate and Rhythm: Normal rate and regular rhythm.     Pulses: Normal pulses.     Heart sounds: Normal heart sounds.  Pulmonary:     Effort: Pulmonary effort is normal. No respiratory distress.     Breath sounds: Normal breath sounds.  Abdominal:     General: Bowel sounds are normal. There is no distension.     Palpations: Abdomen is soft.     Tenderness: There is no abdominal tenderness.  Genitourinary:    Comments: Has foley  Penile tear  Musculoskeletal:     Cervical back: Neck supple.     Right lower leg: No edema.     Left lower leg: No edema.     Comments: Right hemiplegia   Lymphadenopathy:     Cervical: No cervical adenopathy.  Skin:    General: Skin is warm and dry.  Neurological:     Mental Status: He is alert. Mental status is at baseline.  Psychiatric:        Mood and Affect: Mood normal.     ASSESSMENT/ PLAN:  TODAY:   1. Dyslipidemia associated with type 2 diabetes mellitus: is stable LDL 58; continue lipitor 40 mg daily   2. Micro-albuminuria due to type 2 diabetes mellitus is without change 413.2 is on ARB   3. Major depression with psychotic features: is stable will continue prozac 20 mg daily is off antipsychotics.   PREVIOUS   4. CKD stage 3 due to type 2 diabetes mellitus: is stable bun 26; creat 1.58 will monitor   5 Chronic urine retention: is without change has long term foley followed by urology  6. Type 2 diabetes mellitus with peripheral vascular disease: hgb a1c 6.5 will continue lantus 25 units nightly and novolog 10 units with meals.   7. Protein calorie malnutrition severe: is stable weight is 161 pounds albumin 3.1 will monitor   8. Nontraumatic subcortical hemorrhage of left cerebral hemisphere/  hemorrhagic cerebrovascular accident right hemiparesis  is stable will continue baclofen 10 mg twice daily for spasticity  9.  CAD native artery native heart without angina: is status post CABG 2013: is stable will continue lopressor 12.5 mg twice daily   10. hypertension associated with diabetes mellitus: is stable b/p 151/81 will continue norvasc 5 mg daily and lopressor 12.5 mg twice daily diovan 40 mg twice daily     Will check hgb a1c      MD is aware of resident's narcotic use and is in agreement with current plan of care. We will attempt to wean resident as appropriate.  Ok Edwards NP [facility]  Contact [phone] Monday through Friday 8am- 5pm  After hours call [phone]

## 2020-07-15 DIAGNOSIS — Z961 Presence of intraocular lens: Secondary | ICD-10-CM | POA: Diagnosis not present

## 2020-07-15 DIAGNOSIS — E113293 Type 2 diabetes mellitus with mild nonproliferative diabetic retinopathy without macular edema, bilateral: Secondary | ICD-10-CM | POA: Diagnosis not present

## 2020-07-15 DIAGNOSIS — H2511 Age-related nuclear cataract, right eye: Secondary | ICD-10-CM | POA: Diagnosis not present

## 2020-07-20 ENCOUNTER — Non-Acute Institutional Stay (SKILLED_NURSING_FACILITY): Payer: Medicare PPO | Admitting: Adult Health

## 2020-07-20 ENCOUNTER — Encounter: Payer: Self-pay | Admitting: Adult Health

## 2020-07-20 DIAGNOSIS — R339 Retention of urine, unspecified: Secondary | ICD-10-CM | POA: Diagnosis not present

## 2020-07-20 DIAGNOSIS — E1122 Type 2 diabetes mellitus with diabetic chronic kidney disease: Secondary | ICD-10-CM | POA: Diagnosis not present

## 2020-07-20 DIAGNOSIS — N183 Chronic kidney disease, stage 3 unspecified: Secondary | ICD-10-CM

## 2020-07-20 DIAGNOSIS — E1151 Type 2 diabetes mellitus with diabetic peripheral angiopathy without gangrene: Secondary | ICD-10-CM | POA: Diagnosis not present

## 2020-07-20 NOTE — Progress Notes (Signed)
Location:    [facility] Room Number: 130/P Place of Service:  SNF (31)   CODE STATUS: Full Code  No Known Allergies  Chief Complaint  Patient presents with  . Medical Management of Chronic Issues         CKD stage 3 due to type 2 diabetes mellitus:  Chronic urine retention: Type 2 diabetes mellitus with peripheral vascular disease     HPI:  He is a 74 year old long term resident of this facility being seen for the management of his chronic illnesses: CKD stage 3 due to type 2 diabetes mellitus:  Chronic urine retention:   Type 2 diabetes mellitus with peripheral vascular disease. There are no reports of uncontrolled pain; no reports of changes in appetite; weight is stable; no report of anxiety or agitation.   Past Medical History:  Diagnosis Date  . A-fib ([place])    a. Post-op afib after CABG 08/2012.  [name] Acute gastric ulcer with hemorrhage   . Acute respiratory failure with hypoxia ([place])   . Aphasia following cerebral infarction   . Atrial fibrillation ([place])   . CAD (coronary artery disease)    a. NSTEMI s/p stent to distal RCA 03/2000. b. Inf MI s/p emergent thrombectomy/stenting mid RCA 10/2000. c. NSTEMI s/p CABGx4 (LIMA-LAD, SVG-OM2, seq SVG-acute marginal and PD) 02/10/11 - course complicated by confusion, post-op AF, L pleural effusion with thoracentesis. d. Normal LV function by echo 08/2012.  . Diabetes mellitus   . Diverticulosis   . Dysphagia following cerebral infarction   . Encephalopathy   . Extended spectrum beta lactamase (ESBL) resistance   . Generalized anxiety disorder   . GERD (gastroesophageal reflux disease)   . Hemiplegia and hemiparesis following cerebral infarction affecting right dominant side ([place])   . Hiatal hernia   . HLD (hyperlipidemia)   . HTN (hypertension)   . Non-ST elevation (NSTEMI) myocardial infarction ([place] )   . Nontraumatic intracerebral hemorrhage in hemisphere, subcortical ([place])   . Peripheral vascular disease ([place])     a. Carotid dopplers neg 08/13/12. b. Pre-cabg ABIs - R=0.87 suggesting mild dz, L=1.29 possibly falsely elevated due to calcified vessels.  . Pleural effusion    a. L pleural eff after CABG s/p thoracentesis 08/22/12.  . Prostate cancer [facility])    Status post radiation treatment.  . Prostatic hypertrophy    a. Hx of urinary retention, awaiting TURP.  [name] Severe protein-calorie malnutrition ([place])   . Tobacco abuse   . 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 Valvular heart disease    a. Mild  MR by TEE 08/2012.    Past Surgical History:  Procedure Laterality Date  . APPENDECTOMY    . BACK SURGERY    . CORONARY ARTERY BYPASS GRAFT  08/16/2012   Procedure: CORONARY ARTERY BYPASS GRAFTING (CABG);  Surgeon: Melrose Nakayama, MD;  Location: [facility];  Service: Open Heart Surgery;  Laterality: N/A;  . IR REPLC GASTRO/COLONIC TUBE PERCUT W/FLUORO  02/13/2019  . LEFT HEART CATHETERIZATION WITH CORONARY ANGIOGRAM N/A 08/14/2012   Procedure: LEFT HEART CATHETERIZATION WITH CORONARY ANGIOGRAM;  Surgeon: Peter M Martinique, MD;  Location: [facility] CATH LAB;  Service: Cardiovascular;  Laterality: N/A;    Social History   Socioeconomic History  . Marital status: Married    Spouse name: Not on file  . Number of children: Not on file  . Years of education: Not on file  . Highest education level: Not on file  Occupational  History  . Occupation: retired   Tobacco Use  . Smoking status: Former Smoker    Quit date: 11/12/1972    Years since quitting: 47.7  . Smokeless tobacco: Never Used  Vaping Use  . Vaping Use: Never used  Substance and Sexual Activity  . Alcohol use: No  . Drug use: No  . Sexual activity: Not Currently  Other Topics Concern  . Not on file  Social History Narrative   He is a long term patient of [facility]    Social Determinants of Health   Financial Resource Strain:   . Difficulty of Paying Living Expenses:   Food Insecurity:   . Worried About Charity fundraiser in the  Last Year:   . Arboriculturist in the Last Year:   Transportation Needs:   . Film/video editor (Medical):   [name] Lack of Transportation (Non-Medical):   Physical Activity: Inactive  . Days of Exercise per Week: 0 days  . Minutes of Exercise per Session: 0 min  Stress:   . Feeling of Stress :   Social Connections:   . Frequency of Communication with Friends and Family:   . Frequency of Social Gatherings with Friends and Family:   . Attends Religious Services:   . Active Member of Clubs or Organizations:   . Attends Archivist Meetings:   [name] Marital Status:   Intimate Partner Violence:   . Fear of Current or Ex-Partner:   . Emotionally Abused:   [name] Physically Abused:   . Sexually Abused:    Family History  Problem Relation Age of Onset  . Hypertension Mother       VITAL SIGNS BP 122/62   Pulse 64   Temp (!) 96.8 F (36 C) (Oral)   Ht 5' 11"  (1.803 m)   Wt 161 lb 3.2 oz (73.1 kg)   BMI 22.48 kg/m   Outpatient Encounter Medications as of 07/20/2020  Medication Sig  . acetaminophen (TYLENOL) 325 MG tablet Take 650 mg by mouth every 4 (four) hours as needed. per standing order for pain/fever DO NOT EXCEED >3,000 mg in 24 hours  . amLODipine (NORVASC) 5 MG tablet Take 5 mg by mouth daily.   [name] atorvastatin (LIPITOR) 40 MG tablet Take 40 mg by mouth at bedtime.   . baclofen (LIORESAL) 10 MG tablet Take 1 tablet by mouth 2 (two) times daily. Hold for sedation  . Eyelid Cleansers (OCUSOFT LID SCRUB EX) Apply 1 each topically daily. Both eyes  . FLUoxetine (PROZAC) 20 MG/5ML solution Take 30 mg by mouth daily.   . Glucerna (GLUCERNA) LIQD Take 237 mLs by mouth daily.  . hydrocortisone cream (PREPARATION H) 1 % Apply rectally twice daily and as needed for hemorrhoids  . Insulin Glargine (LANTUS SOLOSTAR) 100 UNIT/ML Solostar Pen Inject 20 Units into the skin daily.  . insulin lispro (HUMALOG) 100 UNIT/ML cartridge Inject 10 Units into the skin 3 (three) times daily  before meals. Give 10 units SQ with meals if Blood Sugar >150  . lansoprazole (PREVACID SOLUTAB) 30 MG disintegrating tablet Take 30 mg by mouth daily. Give before meals  . Melatonin 1 MG TABS Take 2 tablets by mouth at bedtime.   . metoprolol tartrate (LOPRESSOR) 25 MG tablet Take 12.5 mg by mouth 2 (two) times daily.   . NON FORMULARY Diet Type:  Dysphagia 3, thin no straws  . Pramoxine-Calamine (AVEENO ANTI-ITCH) 1-3 % LOTN Apply to the extremities and back daily and prn  for itching/ dry skin. Do not apply to the buttocks wound  . valsartan (DIOVAN) 40 MG tablet Take 40 mg by mouth 2 (two) times daily.    No facility-administered encounter medications on file as of 07/20/2020.     SIGNIFICANT DIAGNOSTIC EXAMS   LABS REVIEWED PREVIOUS:    09-06-19: hgb a1c 6.4 10-07-19: chol 102;ldl 63; trig 81; hdl 23; mag 2.0 PSA 0.36 10-31-19: hemoccult: neg; hep C neg 11-01-19: hemoccult: neg 11-02-19: hemoccult: neg  01-15-20: wbc 7.7; hgb 16.5; hct 51.4; mcv 93.1 plt 188; glucose 99; bun 32; creat 1.49; k+ 4.2; na++ 142; ca 8.9; liver normal albumin 3.1; hgb a1c 6.5  05-07-20: urine for micro-albumin: 413.2 ( on ARB)  05-18-20: wbc 9.2; hgb 14.8; hct 45.8; mcv 92.5 plt 270; glucose 128; bun 32; creat 1.60 ;k+ 4.3; na++ 140; ca 8.6 alk phos 142 albumin 2.5 05-19-20: glucose 109; bun 34; creat 1.79; k+ 4.0; na++ 140; ca 8.2 alk phos 145; albumin 2.3  05-22-20: glucose 84; bun 26; creat 1.58; k+ 4.7; na++ 140; ca 8.5 05-25-20: glucose 96; bun 23; creat 1.42; k+ 4.0; na++ 139; ca 8.1   NO NEW LABS.     Review of Systems  Reason unable to perform ROS: expressive aphasia       Physical Exam Constitutional:      General: He is not in acute distress.    Appearance: He is well-developed. He is not diaphoretic.  Neck:     Thyroid: No thyromegaly.  Cardiovascular:     Rate and Rhythm: Normal rate and regular rhythm.     Pulses: Normal pulses.     Heart sounds: Normal heart sounds.  Pulmonary:      Effort: Pulmonary effort is normal. No respiratory distress.     Breath sounds: Normal breath sounds.  Abdominal:     General: Bowel sounds are normal. There is no distension.     Palpations: Abdomen is soft.     Tenderness: There is no abdominal tenderness.  Genitourinary:    Comments: Has foley  Penile tear  Musculoskeletal:     Cervical back: Neck supple.     Right lower leg: No edema.     Left lower leg: No edema.     Comments: Right hemiplegia   Lymphadenopathy:     Cervical: No cervical adenopathy.  Skin:    General: Skin is warm and dry.  Neurological:     Mental Status: He is alert. Mental status is at baseline.  Psychiatric:        Mood and Affect: Mood normal.     ASSESSMENT/ PLAN:  TODAY:   1. CKD stage 3 due to type 2 diabetes mellitus: is stable bun 23; creat 1.42 will monitor   2. Chronic urine retention: is stable has long term foley will monitor  3. Type 2 diabetes mellitus with peripheral vascular disease hgb a1c 6. Will continue lantus 25 units nightly and novolog 10 units with meals  PREVIOUS   4. Protein calorie malnutrition severe: is stable weight is 161 pounds albumin 3.1 will monitor   5. Nontraumatic subcortical hemorrhage of left cerebral hemisphere/ hemorrhagic cerebrovascular accident right hemiparesis  is stable will continue baclofen 10 mg twice daily for spasticity  6. CAD native artery native heart without angina: is status post CABG 2013: is stable will continue lopressor 12.5 mg twice daily   7. hypertension associated with diabetes mellitus: is stable b/p 122/62 will continue norvasc 5 mg daily and lopressor 12.5 mg  twice daily diovan 40 mg twice daily   8. Dyslipidemia associated with type 2 diabetes mellitus: is stable LDL 58; continue lipitor 40 mg daily   9. Micro-albuminuria due to type 2 diabetes mellitus is without change 413.2 is on ARB   10. Major depression with psychotic features: is stable will continue prozac 30 mg daily  is off antipsychotics.        MD is aware of resident's narcotic use and is in agreement with current plan of care. We will attempt to wean resident as appropriate.  Ok Edwards NP [facility]  Contact [phone] Monday through Friday 8am- 5pm  After hours call [phone]

## 2020-07-23 ENCOUNTER — Other Ambulatory Visit (HOSPITAL_COMMUNITY)
Admission: RE | Admit: 2020-07-23 | Discharge: 2020-07-23 | Disposition: A | Discharge status: Home / Self Care | Payer: Medicare PPO | Source: Skilled Nursing Facility | Attending: Adult Health | Admitting: Adult Health

## 2020-07-23 DIAGNOSIS — E1165 Type 2 diabetes mellitus with hyperglycemia: Secondary | ICD-10-CM | POA: Insufficient documentation

## 2020-07-23 LAB — LIPID PANEL
Cholesterol: 79 mg/dL (ref 0–200)
HDL: 20 mg/dL — ABNORMAL LOW (ref >40)
LDL Cholesterol: 48 mg/dL (ref 0–99)
Total CHOL/HDL Ratio: 4 RATIO
Triglycerides: 53 mg/dL (ref <150)
VLDL: 11 mg/dL (ref 0–40)

## 2020-07-23 LAB — HEMOGLOBIN A1C
Hgb A1c MFr Bld: 6.2 % — ABNORMAL HIGH (ref 4.8–5.6)
Mean Plasma Glucose: 131.24 mg/dL

## 2020-08-03 DIAGNOSIS — F333 Major depressive disorder, recurrent, severe with psychotic symptoms: Secondary | ICD-10-CM | POA: Diagnosis not present

## 2020-08-03 DIAGNOSIS — I610 Nontraumatic intracerebral hemorrhage in hemisphere, subcortical: Secondary | ICD-10-CM | POA: Diagnosis not present

## 2020-08-03 DIAGNOSIS — F5105 Insomnia due to other mental disorder: Secondary | ICD-10-CM | POA: Diagnosis not present

## 2020-08-03 DIAGNOSIS — Z1159 Encounter for screening for other viral diseases: Secondary | ICD-10-CM | POA: Diagnosis not present

## 2020-08-05 DIAGNOSIS — I615 Nontraumatic intracerebral hemorrhage, intraventricular: Secondary | ICD-10-CM | POA: Diagnosis not present

## 2020-08-05 DIAGNOSIS — G8191 Hemiplegia, unspecified affecting right dominant side: Secondary | ICD-10-CM | POA: Diagnosis not present

## 2020-08-05 DIAGNOSIS — H53461 Homonymous bilateral field defects, right side: Secondary | ICD-10-CM | POA: Diagnosis not present

## 2020-08-05 DIAGNOSIS — I6990 Unspecified sequelae of unspecified cerebrovascular disease: Secondary | ICD-10-CM | POA: Diagnosis not present

## 2020-08-05 DIAGNOSIS — I6930 Unspecified sequelae of cerebral infarction: Secondary | ICD-10-CM | POA: Diagnosis not present

## 2020-08-05 DIAGNOSIS — R4701 Aphasia: Secondary | ICD-10-CM | POA: Diagnosis not present

## 2020-08-07 ENCOUNTER — Encounter: Payer: Self-pay | Admitting: Adult Health

## 2020-08-07 ENCOUNTER — Non-Acute Institutional Stay: Payer: Self-pay | Admitting: Adult Health

## 2020-08-07 DIAGNOSIS — B351 Tinea unguium: Secondary | ICD-10-CM | POA: Diagnosis not present

## 2020-08-07 DIAGNOSIS — F323 Major depressive disorder, single episode, severe with psychotic features: Secondary | ICD-10-CM | POA: Diagnosis not present

## 2020-08-07 DIAGNOSIS — M2042 Other hammer toe(s) (acquired), left foot: Secondary | ICD-10-CM | POA: Diagnosis not present

## 2020-08-07 DIAGNOSIS — E1151 Type 2 diabetes mellitus with diabetic peripheral angiopathy without gangrene: Secondary | ICD-10-CM | POA: Diagnosis not present

## 2020-08-07 DIAGNOSIS — Z794 Long term (current) use of insulin: Secondary | ICD-10-CM | POA: Diagnosis not present

## 2020-08-10 DIAGNOSIS — I610 Nontraumatic intracerebral hemorrhage in hemisphere, subcortical: Secondary | ICD-10-CM | POA: Diagnosis not present

## 2020-08-10 DIAGNOSIS — Z1159 Encounter for screening for other viral diseases: Secondary | ICD-10-CM | POA: Diagnosis not present

## 2020-08-10 NOTE — Progress Notes (Signed)
Location:   [facility] Room Number: 130/P Place of Service:  SNF (31)   CODE STATUS: full code   No Known Allergies  Chief Complaint  Patient presents with  . Acute Visit    medication review     HPI:  He is taking prozac 30 mg daily he has failed one wean in the past. He has not been on antipsychotic medication for an extended period of time. There are no reports of agitation or anxiety. There are no reports of missed doses. No reports of difficulty tolerating medication.   Past Medical History:  Diagnosis Date  . A-fib ([place])    a. Post-op afib after CABG 08/2012.  [name] Acute gastric ulcer with hemorrhage   . Acute respiratory failure with hypoxia ([place])   . Aphasia following cerebral infarction   . Atrial fibrillation ([place])   . CAD (coronary artery disease)    a. NSTEMI s/p stent to distal RCA 03/2000. b. Inf MI s/p emergent thrombectomy/stenting mid RCA 10/2000. c. NSTEMI s/p CABGx4 (LIMA-LAD, SVG-OM2, seq SVG-acute marginal and PD) 06/13/40 - course complicated by confusion, post-op AF, L pleural effusion with thoracentesis. d. Normal LV function by echo 08/2012.  . Diabetes mellitus   . Diverticulosis   . Dysphagia following cerebral infarction   . Encephalopathy   . Extended spectrum beta lactamase (ESBL) resistance   . Generalized anxiety disorder   . GERD (gastroesophageal reflux disease)   . Hemiplegia and hemiparesis following cerebral infarction affecting right dominant side ([place])   . Hiatal hernia   . HLD (hyperlipidemia)   . HTN (hypertension)   . Non-ST elevation (NSTEMI) myocardial infarction ([place])   . Nontraumatic intracerebral hemorrhage in hemisphere, subcortical ([place])   . Peripheral vascular disease ([place])    a. Carotid dopplers neg 08/13/12. b. Pre-cabg ABIs - R=0.87 suggesting mild dz, L=1.29 possibly falsely elevated due to calcified vessels.  . Pleural effusion    a. L pleural eff after CABG s/p thoracentesis 08/22/12.  . Prostate cancer [facility])     Status post radiation treatment.  . Prostatic hypertrophy    a. Hx of urinary retention, awaiting TURP.  [name] Severe protein-calorie malnutrition ([place])   . Tobacco abuse   . 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 Valvular heart disease    a. Mild  MR by TEE 08/2012.    Past Surgical History:  Procedure Laterality Date  . APPENDECTOMY    . BACK SURGERY    . CORONARY ARTERY BYPASS GRAFT  08/16/2012   Procedure: CORONARY ARTERY BYPASS GRAFTING (CABG);  Surgeon: Melrose Nakayama, MD;  Location: [facility];  Service: Open Heart Surgery;  Laterality: N/A;  . IR REPLC GASTRO/COLONIC TUBE PERCUT W/FLUORO  02/13/2019  . LEFT HEART CATHETERIZATION WITH CORONARY ANGIOGRAM N/A 08/14/2012   Procedure: LEFT HEART CATHETERIZATION WITH CORONARY ANGIOGRAM;  Surgeon: Peter M Martinique, MD;  Location: [facility] C[facility];  Service: Cardiovascular;  Laterality: N/A;    Social History   Socioeconomic History  . Marital status: Married    Spouse name: Not on file  . Number of children: Not on file  . Years of education: Not on file  . Highest education level: Not on file  Occupational History  . Occupation: retired   Tobacco Use  . Smoking status: Former Smoker    Quit date: 11/12/1972    Years since quitting: 47.7  . Smokeless tobacco: Never Used  Vaping Use  . Vaping Use: Never used  Substance and Sexual Activity  . Alcohol use: No  . Drug use: No  . Sexual activity: Not Currently  Other Topics Concern  . Not on file  Social History Narrative   He is a long term patient of [facility]    Social Determinants of Health   Financial Resource Strain:   . Difficulty of Paying Living Expenses: Not on file  Food Insecurity:   . Worried About Charity fundraiser in the Last Year: Not on file  . Ran Out of Food in the Last Year: Not on file  Transportation Needs:   . Lack of Transportation (Medical): Not on file  . Lack of Transportation (Non-Medical): Not on file  Physical Activity: Inactive  .  Days of Exercise per Week: 0 days  . Minutes of Exercise per Session: 0 min  Stress:   . Feeling of Stress : Not on file  Social Connections:   . Frequency of Communication with Friends and Family: Not on file  . Frequency of Social Gatherings with Friends and Family: Not on file  . Attends Religious Services: Not on file  . Active Member of Clubs or Organizations: Not on file  . Attends Archivist Meetings: Not on file  . Marital Status: Not on file  Intimate Partner Violence:   . Fear of Current or Ex-Partner: Not on file  . Emotionally Abused: Not on file  . Physically Abused: Not on file  . Sexually Abused: Not on file   Family History  Problem Relation Age of Onset  . Hypertension Mother       VITAL SIGNS BP (!) 147/73   Pulse (!) 56   Temp 98.6 F (37 C) (Oral)   Resp 20   Ht 5' 11"  (1.803 m)   Wt 161 lb 3.2 oz (73.1 kg)   SpO2 100%   BMI 22.48 kg/m   Outpatient Encounter Medications as of 08/07/2020  Medication Sig  . acetaminophen (TYLENOL) 325 MG tablet Take 650 mg by mouth every 4 (four) hours as needed. per standing order for pain/fever DO NOT EXCEED >3,000 mg in 24 hours  . amLODipine (NORVASC) 5 MG tablet Take 5 mg by mouth daily.   [name] atorvastatin (LIPITOR) 40 MG tablet Take 40 mg by mouth at bedtime.   . baclofen (LIORESAL) 10 MG tablet Take 1 tablet by mouth 2 (two) times daily. Hold for sedation  . Eyelid Cleansers (OCUSOFT LID SCRUB EX) Apply 1 each topically daily. Both eyes  . FLUoxetine (PROZAC) 20 MG/5ML solution Take 30 mg by mouth daily.   . Glucerna (GLUCERNA) LIQD Take 237 mLs by mouth daily.  . hydrocortisone cream (PREPARATION H) 1 % Apply rectally twice daily and as needed for hemorrhoids  . Insulin Glargine (LANTUS SOLOSTAR) 100 UNIT/ML Solostar Pen Inject 20 Units into the skin daily.  . insulin lispro (HUMALOG) 100 UNIT/ML cartridge Inject 10 Units into the skin 3 (three) times daily before meals. Give 10 units SQ with meals if  Blood Sugar >150  . lansoprazole (PREVACID SOLUTAB) 30 MG disintegrating tablet Take 30 mg by mouth daily. Give before meals  . Melatonin 1 MG TABS Take 2 tablets by mouth at bedtime.   . metoprolol tartrate (LOPRESSOR) 25 MG tablet Take 12.5 mg by mouth 2 (two) times daily.   . NON FORMULARY Diet Type:  Dysphagia 3, thin no straws  . Pramoxine-Calamine (AVEENO ANTI-ITCH) 1-3 % LOTN Apply to the extremities and back daily and prn for itching/ dry  skin. Do not apply to the buttocks wound  . valsartan (DIOVAN) 40 MG tablet Take 40 mg by mouth 2 (two) times daily.    No facility-administered encounter medications on file as of 08/07/2020.     SIGNIFICANT DIAGNOSTIC EXAMS   LABS REVIEWED PREVIOUS:    09-06-19: hgb a1c 6.4 10-07-19: chol 102;ldl 63; trig 81; hdl 23; mag 2.0 PSA 0.36 10-31-19: hemoccult: neg; hep C neg 11-01-19: hemoccult: neg 11-02-19: hemoccult: neg  01-15-20: wbc 7.7; hgb 16.5; hct 51.4; mcv 93.1 plt 188; glucose 99; bun 32; creat 1.49; k+ 4.2; na++ 142; ca 8.9; liver normal albumin 3.1; hgb a1c 6.5  05-07-20: urine for micro-albumin: 413.2 ( on ARB)  05-18-20: wbc 9.2; hgb 14.8; hct 45.8; mcv 92.5 plt 270; glucose 128; bun 32; creat 1.60 ;k+ 4.3; na++ 140; ca 8.6 alk phos 142 albumin 2.5 05-19-20: glucose 109; bun 34; creat 1.79; k+ 4.0; na++ 140; ca 8.2 alk phos 145; albumin 2.3  05-22-20: glucose 84; bun 26; creat 1.58; k+ 4.7; na++ 140; ca 8.5 05-25-20: glucose 96; bun 23; creat 1.42; k+ 4.0; na++ 139; ca 8.1   NO NEW LABS.     Review of Systems  Reason unable to perform ROS: expressive aphasia     Physical Exam Constitutional:      General: He is not in acute distress.    Appearance: He is well-developed. He is not diaphoretic.  Neck:     Thyroid: No thyromegaly.  Cardiovascular:     Rate and Rhythm: Normal rate and regular rhythm.     Pulses: Normal pulses.     Heart sounds: Normal heart sounds.  Pulmonary:     Effort: Pulmonary effort is normal. No respiratory  distress.     Breath sounds: Normal breath sounds.  Abdominal:     General: Bowel sounds are normal. There is no distension.     Palpations: Abdomen is soft.     Tenderness: There is no abdominal tenderness.  Genitourinary:    Comments: Has foley  Penile tear  Musculoskeletal:     Cervical back: Neck supple.     Right lower leg: No edema.     Left lower leg: No edema.     Comments: Right hemiplegia   Lymphadenopathy:     Cervical: No cervical adenopathy.  Skin:    General: Skin is warm and dry.  Neurological:     Mental Status: He is alert. Mental status is at baseline.  Psychiatric:        Mood and Affect: Mood normal.        ASSESSMENT/ PLAN:  TODAY  1. Major depression with psychotic features  Is stable  Will lower prozac to 20 mg daily  Will continue to monitor his status.     MD is aware of resident's narcotic use and is in agreement with current plan of care. We will attempt to wean resident as appropriate.  Tanner Edwards NP [facility]  Contact [phone] Monday through Friday 8am- 5pm  After hours call [phone]

## 2020-08-13 DIAGNOSIS — I610 Nontraumatic intracerebral hemorrhage in hemisphere, subcortical: Secondary | ICD-10-CM | POA: Diagnosis not present

## 2020-08-13 DIAGNOSIS — Z1159 Encounter for screening for other viral diseases: Secondary | ICD-10-CM | POA: Diagnosis not present

## 2020-08-18 DIAGNOSIS — I610 Nontraumatic intracerebral hemorrhage in hemisphere, subcortical: Secondary | ICD-10-CM | POA: Diagnosis not present

## 2020-08-18 DIAGNOSIS — Z1159 Encounter for screening for other viral diseases: Secondary | ICD-10-CM | POA: Diagnosis not present

## 2020-08-20 DIAGNOSIS — I610 Nontraumatic intracerebral hemorrhage in hemisphere, subcortical: Secondary | ICD-10-CM | POA: Diagnosis not present

## 2020-08-20 DIAGNOSIS — Z1159 Encounter for screening for other viral diseases: Secondary | ICD-10-CM | POA: Diagnosis not present

## 2020-08-24 DIAGNOSIS — I610 Nontraumatic intracerebral hemorrhage in hemisphere, subcortical: Secondary | ICD-10-CM | POA: Diagnosis not present

## 2020-08-24 DIAGNOSIS — Z1159 Encounter for screening for other viral diseases: Secondary | ICD-10-CM | POA: Diagnosis not present

## 2020-08-26 ENCOUNTER — Encounter: Payer: Self-pay | Admitting: Adult Health

## 2020-08-26 ENCOUNTER — Non-Acute Institutional Stay (SKILLED_NURSING_FACILITY): Payer: Medicare PPO | Admitting: Adult Health

## 2020-08-26 DIAGNOSIS — G8111 Spastic hemiplegia affecting right dominant side: Secondary | ICD-10-CM

## 2020-08-26 DIAGNOSIS — I610 Nontraumatic intracerebral hemorrhage in hemisphere, subcortical: Secondary | ICD-10-CM

## 2020-08-26 DIAGNOSIS — I2510 Atherosclerotic heart disease of native coronary artery without angina pectoris: Secondary | ICD-10-CM | POA: Diagnosis not present

## 2020-08-26 DIAGNOSIS — E43 Unspecified severe protein-calorie malnutrition: Secondary | ICD-10-CM

## 2020-08-26 NOTE — Progress Notes (Signed)
Location:    [place] Room Number: 130/P Place of Service:  SNF (31)   CODE STATUS: Full Code  No Known Allergies  Chief Complaint  Patient presents with   Medical Management of Chronic Issues            Protein calorie malnutrition severe:  Nontraumatic subcortical hemorrhage of left cerebral hemisphere/hemorrhagic cerebrovascular accident right hemiparesis    CAD native artery native heart without angina    HPI:  [name] is a 74 year old long term resident of this facility being seen for the management of his chronic illnesses:  Protein calorie malnutrition severe:  Nontraumatic subcortical hemorrhage of left cerebral hemisphere/hemorrhagic cerebrovascular accident right hemiparesis    CAD native artery native heart without angina. There are no reports of agitation; anxiety; no reports of pain; no reports of insomnia.   Past Medical History:  Diagnosis Date   A-fib [facility])    a. Post-op afib after CABG 08/2012.   Acute gastric ulcer with hemorrhage    Acute respiratory failure with hypoxia (HCC)    Aphasia following cerebral infarction    Atrial fibrillation (HCC)    CAD (coronary artery disease)    a. NSTEMI s/p stent to distal RCA 03/2000. b. Inf MI s/p emergent thrombectomy/stenting mid RCA 10/2000. c. NSTEMI s/p CABGx4 (LIMA-LAD, SVG-OM2, seq SVG-acute marginal and PD) 08/20/34 - course complicated by confusion, post-op AF, L pleural effusion with thoracentesis. d. Normal LV function by echo 08/2012.   Diabetes mellitus    Diverticulosis    Dysphagia following cerebral infarction    Encephalopathy    Extended spectrum beta lactamase (ESBL) resistance    Generalized anxiety disorder    GERD (gastroesophageal reflux disease)    Hemiplegia and hemiparesis following cerebral infarction affecting right dominant side (HCC)    Hiatal hernia    HLD (hyperlipidemia)    HTN (hypertension)    Non-ST elevation (NSTEMI) myocardial infarction [facility])     Nontraumatic intracerebral hemorrhage in hemisphere, subcortical [facility])    Peripheral vascular disease (HCC)    a. Carotid dopplers neg 08/13/12. b. Pre-cabg ABIs - R=0.87 suggesting mild dz, L=1.29 possibly falsely elevated due to calcified vessels.   Pleural effusion    a. L pleural eff after CABG s/p thoracentesis 08/22/12.   Prostate cancer [facility])    Status post radiation treatment.   Prostatic hypertrophy    a. Hx of urinary retention, awaiting TURP.   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 Mild  MR by TEE 08/2012.    Past Surgical History:  Procedure Laterality Date   APPENDECTOMY     BACK SURGERY     CORONARY ARTERY BYPASS GRAFT  08/16/2012   Procedure: CORONARY ARTERY BYPASS GRAFTING (CABG);  Surgeon: Melrose Nakayama, MD;  Location: [place];  Service: Open Heart Surgery;  Laterality: N/A;   IR REPLC GASTRO/COLONIC TUBE PERCUT W/FLUORO  02/13/2019   LEFT HEART CATHETERIZATION WITH CORONARY ANGIOGRAM N/A 08/14/2012   Procedure: LEFT HEART CATHETERIZATION WITH CORONARY ANGIOGRAM;  Surgeon: Peter M Martinique, MD;  Location: [facility] CATH LAB;  Service: Cardiovascular;  Laterality: N/A;    Social History   Socioeconomic History   Marital status: Married    Spouse name: Not on file   Number of children: Not on file   Years of education: Not on file   Highest education level: Not  on file  Occupational History   Occupation: retired   Tobacco Use   Smoking status: Former Smoker    Quit date: 11/12/1972    Years since quitting: 47.8   Smokeless tobacco: Never Used  Vaping Use   Vaping Use: Never used  Substance and Sexual Activity   Alcohol use: No   Drug use: No   Sexual activity: Not Currently  Other Topics Concern   Not on file  Social History Narrative   [name] is a long term patient of [facility]    Social Determinants of Health   Financial Resource  Strain:    Difficulty of Paying Living Expenses: Not on file  Food Insecurity:    Worried About Charity fundraiser in the Last Year: Not on file   [employer] of Food in the Last Year: Not on file  Transportation Needs:    Lack of Transportation (Medical): Not on file   Lack of Transportation (Non-Medical): Not on file  Physical Activity: Inactive   Days of Exercise per Week: 0 days   Minutes of Exercise per Session: 0 min  Stress:    Feeling of Stress : Not on file  Social Connections:    Frequency of Communication with Friends and Family: Not on file   Frequency of Social Gatherings with Friends and Family: Not on file   Attends Religious Services: Not on file   Active Member of Clubs or Organizations: Not on file   Attends Archivist Meetings: Not on file   Marital Status: Not on file  Intimate Partner Violence:    Fear of Current or Ex-Partner: Not on file   Emotionally Abused: Not on file   Physically Abused: Not on file   Sexually Abused: Not on file   Family History  Problem Relation Age of Onset   Hypertension Mother       VITAL SIGNS BP 135/73    Pulse (!) 47    Temp (!) 97 F (36.1 C) (Oral)    Ht 5' 11"  (1.803 m)    Wt 154 lb (69.9 kg)    BMI 21.48 kg/m   Outpatient Encounter Medications as of 08/26/2020  Medication Sig   acetaminophen (TYLENOL) 325 MG tablet Take 650 mg by mouth every 4 (four) hours as needed. per standing order for pain/fever DO NOT EXCEED >3,000 mg in 24 hours   amLODipine (NORVASC) 5 MG tablet Take 5 mg by mouth daily.    atorvastatin (LIPITOR) 40 MG tablet Take 40 mg by mouth at bedtime.    baclofen (LIORESAL) 10 MG tablet Take 1 tablet by mouth 2 (two) times daily. Hold for sedation   Eyelid Cleansers (OCUSOFT LID SCRUB EX) Apply 1 each topically daily. Both eyes   FLUoxetine (PROZAC) 20 MG/5ML solution Take 20 mg by mouth daily.    Glucerna (GLUCERNA) LIQD Take 237 mLs by mouth daily.   hydrocortisone  cream (PREPARATION H) 1 % Apply rectally twice daily and as needed for hemorrhoids   Insulin Glargine (LANTUS SOLOSTAR) 100 UNIT/ML Solostar Pen Inject 20 Units into the skin daily.   insulin lispro (HUMALOG) 100 UNIT/ML cartridge Inject 10 Units into the skin 3 (three) times daily before meals. Give 10 units SQ with meals if Blood Sugar >150   lansoprazole (PREVACID SOLUTAB) 30 MG disintegrating tablet Take 30 mg by mouth daily. Give before meals   Melatonin 1 MG TABS Take 2 tablets by mouth at bedtime.    metoprolol tartrate (LOPRESSOR) 25 MG  tablet Take 12.5 mg by mouth 2 (two) times daily.    NON FORMULARY Diet Type:  Dysphagia 3, thin no straws   polyethylene glycol (MIRALAX / GLYCOLAX) 17 g packet Take 17 g by mouth daily as needed.   valsartan (DIOVAN) 40 MG tablet Take 40 mg by mouth 2 (two) times daily.    [DISCONTINUED] Pramoxine-Calamine (AVEENO ANTI-ITCH) 1-3 % LOTN Apply to the extremities and back daily and prn for itching/ dry skin. Do not apply to the buttocks wound   No facility-administered encounter medications on file as of 08/26/2020.     SIGNIFICANT DIAGNOSTIC EXAMS  LABS REVIEWED PREVIOUS:    09-06-19: hgb a1c 6.4 10-07-19: chol 102;ldl 63; trig 81; hdl 23; mag 2.0 PSA 0.36 10-31-19: hemoccult: neg; hep C neg 11-01-19: hemoccult: neg 11-02-19: hemoccult: neg  01-15-20: wbc 7.7; hgb 16.5; hct 51.4; mcv 93.1 plt 188; glucose 99; bun 32; creat 1.49; k+ 4.2; na++ 142; ca 8.9; liver normal albumin 3.1; hgb a1c 6.5  05-07-20: urine for micro-albumin: 413.2 ( on ARB)  05-18-20: wbc 9.2; hgb 14.8; hct 45.8; mcv 92.5 plt 270; glucose 128; bun 32; creat 1.60 ;k+ 4.3; na++ 140; ca 8.6 alk phos 142 albumin 2.5 05-19-20: glucose 109; bun 34; creat 1.79; k+ 4.0; na++ 140; ca 8.2 alk phos 145; albumin 2.3  05-22-20: glucose 84; bun 26; creat 1.58; k+ 4.7; na++ 140; ca 8.5 05-25-20: glucose 96; bun 23; creat 1.42; k+ 4.0; na++ 139; ca 8.1   TODAY  07-23-20: hgb a1c 6.2; chol 79;  ldl 48; trig 53; hdl 20    Review of Systems  Reason unable to perform ROS: expressive aphasia     Physical Exam Constitutional:      General: [name] is not in acute distress.    Appearance: [name] is well-developed. [name] is not diaphoretic.  Neck:     Thyroid: No thyromegaly.  Cardiovascular:     Rate and Rhythm: Normal rate and regular rhythm.     Pulses: Normal pulses.     Heart sounds: Normal heart sounds.  Pulmonary:     Effort: Pulmonary effort is normal. No respiratory distress.     Breath sounds: Normal breath sounds.  Abdominal:     General: Bowel sounds are normal. There is no distension.     Palpations: Abdomen is soft.     Tenderness: There is no abdominal tenderness.  Genitourinary:    Comments: Foley; penile tear  Musculoskeletal:     Cervical back: Neck supple.     Right lower leg: No edema.     Left lower leg: No edema.     Comments: Right hemiplegia   Lymphadenopathy:     Cervical: No cervical adenopathy.  Skin:    General: Skin is warm and dry.  Neurological:     Mental Status: [name] is alert. Mental status is at baseline.  Psychiatric:        Mood and Affect: Mood normal.      ASSESSMENT/ PLAN:  TODAY:   1. Protein calorie malnutrition severe: has lost some weight down to 154 pounds; albumin 3.1 will monitor   2. Nontraumatic subcortical hemorrhage of left cerebral hemisphere/hemorrhagic cerebrovascular accident right hemiparesis is stable will continue baclofen 10 mg twice daily for spasticity  3. CAD native artery native heart without angina: is status post CABG 2012; is stable will continue lopressor 12.5 mg twice daily    PREVIOUS   4. hypertension associated with diabetes mellitus: is stable b/p 135/73 will continue  norvasc 5 mg daily and lopressor 12.5 mg twice daily diovan 40 mg twice daily   5. Dyslipidemia associated with type 2 diabetes mellitus: is stable LDL 48; continue lipitor 40 mg daily   6. Micro-albuminuria due to type 2 diabetes  mellitus is without change 413.2 is on ARB   7. Major depression with psychotic features: is stable will continue prozac 20 mg daily is off antipsychotics.   8. CKD stage 3 due to type 2 diabetes mellitus: is stable bun 23; creat 1.42 will monitor   9. Chronic urine retention: is stable has long term foley will monitor  10. Type 2 diabetes mellitus with peripheral vascular disease hgb a1c 6. Will continue lantus 25 units nightly and novolog 10 units with meals             MD is aware of resident's narcotic use and is in agreement with current plan of care. We will attempt to wean resident as appropriate.  Ok Edwards NP [facility]  Contact [phone] Monday through Friday 8am- 5pm  After hours call [phone]

## 2020-08-27 DIAGNOSIS — Z1159 Encounter for screening for other viral diseases: Secondary | ICD-10-CM | POA: Diagnosis not present

## 2020-08-27 DIAGNOSIS — I610 Nontraumatic intracerebral hemorrhage in hemisphere, subcortical: Secondary | ICD-10-CM | POA: Diagnosis not present

## 2020-08-28 DIAGNOSIS — F5105 Insomnia due to other mental disorder: Secondary | ICD-10-CM | POA: Diagnosis not present

## 2020-08-28 DIAGNOSIS — F333 Major depressive disorder, recurrent, severe with psychotic symptoms: Secondary | ICD-10-CM | POA: Diagnosis not present

## 2020-08-31 DIAGNOSIS — Z1159 Encounter for screening for other viral diseases: Secondary | ICD-10-CM | POA: Diagnosis not present

## 2020-08-31 DIAGNOSIS — I610 Nontraumatic intracerebral hemorrhage in hemisphere, subcortical: Secondary | ICD-10-CM | POA: Diagnosis not present

## 2020-09-08 ENCOUNTER — Encounter: Payer: Self-pay | Admitting: Adult Health

## 2020-09-08 ENCOUNTER — Non-Acute Institutional Stay (SKILLED_NURSING_FACILITY): Payer: Medicare PPO | Admitting: Adult Health

## 2020-09-08 DIAGNOSIS — F323 Major depressive disorder, single episode, severe with psychotic features: Secondary | ICD-10-CM | POA: Diagnosis not present

## 2020-09-08 NOTE — Progress Notes (Signed)
Location:    [place] Room Number: 825-O Place of Service:  SNF (31)   CODE STATUS: Full Code   No Known Allergies  Chief Complaint  Patient presents with  . Acute Visit    Anxiety concerns     HPI:  Staff report that he has been more anxious over the past several days. Today he is stating: I cant talk; and I have a stroke. He is more nervous and is unable to sit still. He has not attempted to get himself out of bed.     Past Medical History:  Diagnosis Date  . A-fib ([place])    a. Post-op afib after CABG 08/2012.  [name] Acute gastric ulcer with hemorrhage   . Acute respiratory failure with hypoxia ([place])   . Aphasia following cerebral infarction   . Atrial fibrillation ([place])   . CAD (coronary artery disease)    a. NSTEMI s/p stent to distal RCA 03/2000. b. Inf MI s/p emergent thrombectomy/stenting mid RCA 10/2000. c. NSTEMI s/p CABGx4 (LIMA-LAD, SVG-OM2, seq SVG-acute marginal and PD) 0/3/70 - course complicated by confusion, post-op AF, L pleural effusion with thoracentesis. d. Normal LV function by echo 08/2012.  . Diabetes mellitus   . Diverticulosis   . Dysphagia following cerebral infarction   . Encephalopathy   . Extended spectrum beta lactamase (ESBL) resistance   . Generalized anxiety disorder   . GERD (gastroesophageal reflux disease)   . Hemiplegia and hemiparesis following cerebral infarction affecting right dominant side ([place])   . Hiatal hernia   . HLD (hyperlipidemia)   . HTN (hypertension)   . Non-ST elevation (NSTEMI) myocardial infarction ([place])   . Nontraumatic intracerebral hemorrhage in hemisphere, subcortical ([place])   . Peripheral vascular disease ([place])    a. Carotid dopplers neg 08/13/12. b. Pre-cabg ABIs - R=0.87 suggesting mild dz, L=1.29 possibly falsely elevated due to calcified vessels.  . Pleural effusion    a. L pleural eff after CABG s/p thoracentesis 08/22/12.  . Prostate cancer [facility])    Status post radiation treatment.  .  Prostatic hypertrophy    a. Hx of urinary retention, awaiting TURP.  [name] Severe protein-calorie malnutrition ([place])   . Tobacco abuse   . 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 Valvular heart disease    a. Mild  MR by TEE 08/2012.    Past Surgical History:  Procedure Laterality Date  . APPENDECTOMY    . BACK SURGERY    . CORONARY ARTERY BYPASS GRAFT  08/16/2012   Procedure: CORONARY ARTERY BYPASS GRAFTING (CABG);  Surgeon: Melrose Nakayama, MD;  Location: [place];  Service: Open Heart Surgery;  Laterality: N/A;  . IR REPLC GASTRO/COLONIC TUBE PERCUT W/FLUORO  02/13/2019  . LEFT HEART CATHETERIZATION WITH CORONARY ANGIOGRAM N/A 08/14/2012   Procedure: LEFT HEART CATHETERIZATION WITH CORONARY ANGIOGRAM;  Surgeon: Peter M Martinique, MD;  Location: [facility] CATH LAB;  Service: Cardiovascular;  Laterality: N/A;    Social History   Socioeconomic History  . Marital status: Married    Spouse name: Not on file  . Number of children: Not on file  . Years of education: Not on file  . Highest education level: Not on file  Occupational History  . Occupation: retired   Tobacco Use  . Smoking status: Former Smoker    Quit date: 11/12/1972    Years since quitting: 47.8  . Smokeless tobacco: Never Used  Vaping Use  . Vaping Use: Never used  Substance  and Sexual Activity  . Alcohol use: No  . Drug use: No  . Sexual activity: Not Currently  Other Topics Concern  . Not on file  Social History Narrative   He is a long term patient of [facility]    Social Determinants of Health   Financial Resource Strain:   . Difficulty of Paying Living Expenses: Not on file  Food Insecurity:   . Worried About Charity fundraiser in the Last Year: Not on file  . Ran Out of Food in the Last Year: Not on file  Transportation Needs:   . Lack of Transportation (Medical): Not on file  . Lack of Transportation (Non-Medical): Not on file  Physical Activity: Inactive  . Days of Exercise per Week: 0 days  .  Minutes of Exercise per Session: 0 min  Stress:   . Feeling of Stress : Not on file  Social Connections:   . Frequency of Communication with Friends and Family: Not on file  . Frequency of Social Gatherings with Friends and Family: Not on file  . Attends Religious Services: Not on file  . Active Member of Clubs or Organizations: Not on file  . Attends Archivist Meetings: Not on file  . Marital Status: Not on file  Intimate Partner Violence:   . Fear of Current or Ex-Partner: Not on file  . Emotionally Abused: Not on file  . Physically Abused: Not on file  . Sexually Abused: Not on file   Family History  Problem Relation Age of Onset  . Hypertension Mother       VITAL SIGNS BP 131/68   Pulse (!) 50   Temp (!) 97.2 F (36.2 C)   Resp 20   Ht 5' 11"  (1.803 m)   Wt 154 lb (69.9 kg)   SpO2 100%   BMI 21.48 kg/m   Outpatient Encounter Medications as of 09/08/2020  Medication Sig  . acetaminophen (TYLENOL) 325 MG tablet Take 650 mg by mouth every 4 (four) hours as needed. per standing order for pain/fever DO NOT EXCEED >3,000 mg in 24 hours  . amLODipine (NORVASC) 5 MG tablet Take 5 mg by mouth daily.   [name] atorvastatin (LIPITOR) 40 MG tablet Take 40 mg by mouth at bedtime.   . baclofen (LIORESAL) 10 MG tablet Take 1 tablet by mouth 2 (two) times daily. Hold for sedation  . busPIRone (BUSPAR) 5 MG tablet Take 5 mg by mouth 2 (two) times daily. For anxiety  . Eyelid Cleansers (OCUSOFT LID SCRUB EX) Apply 1 each topically daily. Both eyes  . FLUoxetine (PROZAC) 20 MG/5ML solution Take 20 mg by mouth daily.   . Glucerna (GLUCERNA) LIQD Take 237 mLs by mouth daily.  . hydrocortisone cream (PREPARATION H) 1 % Apply rectally twice daily and as needed for hemorrhoids  . Insulin Glargine (LANTUS SOLOSTAR) 100 UNIT/ML Solostar Pen Inject 20 Units into the skin daily.  . insulin lispro (HUMALOG) 100 UNIT/ML cartridge Inject 10 Units into the skin 3 (three) times daily before  meals. Give 10 units SQ with meals if Blood Sugar >150  . lansoprazole (PREVACID SOLUTAB) 30 MG disintegrating tablet Take 30 mg by mouth daily. Give before meals  . Melatonin 1 MG TABS Take 2 tablets by mouth at bedtime.   . metoprolol tartrate (LOPRESSOR) 25 MG tablet Take 12.5 mg by mouth 2 (two) times daily.   . NON FORMULARY Diet Type:  Dysphagia 3, thin no straws  . polyethylene glycol (MIRALAX /  GLYCOLAX) 17 g packet Take 17 g by mouth daily as needed.  . valsartan (DIOVAN) 40 MG tablet Take 40 mg by mouth 2 (two) times daily.    No facility-administered encounter medications on file as of 09/08/2020.     SIGNIFICANT DIAGNOSTIC EXAMS   LABS REVIEWED PREVIOUS:    09-06-19: hgb a1c 6.4 10-07-19: chol 102;ldl 63; trig 81; hdl 23; mag 2.0 PSA 0.36 10-31-19: hemoccult: neg; hep C neg 11-01-19: hemoccult: neg 11-02-19: hemoccult: neg  01-15-20: wbc 7.7; hgb 16.5; hct 51.4; mcv 93.1 plt 188; glucose 99; bun 32; creat 1.49; k+ 4.2; na++ 142; ca 8.9; liver normal albumin 3.1; hgb a1c 6.5  05-07-20: urine for micro-albumin: 413.2 ( on ARB)  05-18-20: wbc 9.2; hgb 14.8; hct 45.8; mcv 92.5 plt 270; glucose 128; bun 32; creat 1.60 ;k+ 4.3; na++ 140; ca 8.6 alk phos 142 albumin 2.5 05-19-20: glucose 109; bun 34; creat 1.79; k+ 4.0; na++ 140; ca 8.2 alk phos 145; albumin 2.3  05-22-20: glucose 84; bun 26; creat 1.58; k+ 4.7; na++ 140; ca 8.5 05-25-20: glucose 96; bun 23; creat 1.42; k+ 4.0; na++ 139; ca 8.1  07-23-20: hgb a1c 6.2; chol 79; ldl 48; trig 53; hdl 20   NO NEW LABS.   Review of Systems  Reason unable to perform ROS: aphasia     Physical Exam Constitutional:      General: He is not in acute distress.    Appearance: He is well-developed. He is not diaphoretic.  Neck:     Thyroid: No thyromegaly.  Cardiovascular:     Rate and Rhythm: Normal rate and regular rhythm.     Pulses: Normal pulses.     Heart sounds: Normal heart sounds.  Pulmonary:     Effort: Pulmonary effort is normal.  No respiratory distress.     Breath sounds: Normal breath sounds.  Abdominal:     General: Bowel sounds are normal. There is no distension.     Palpations: Abdomen is soft.     Tenderness: There is no abdominal tenderness.  Genitourinary:    Comments: Foley; penile tear  Musculoskeletal:     Cervical back: Neck supple.     Right lower leg: No edema.     Left lower leg: No edema.     Comments: Right hemiplegia    Lymphadenopathy:     Cervical: No cervical adenopathy.  Skin:    General: Skin is warm and dry.  Neurological:     Mental Status: He is alert. Mental status is at baseline.  Psychiatric:     Comments: Is restless and anxious        ASSESSMENT/ PLAN:  TODAY  1. Major depression with psychotic features  His emotional status is worse Will begin buspar 5 mg twice daily for his anxiety and will monitor his status.     MD is aware of resident's narcotic use and is in agreement with current plan of care. We will attempt to wean resident as appropriate.  Ok Edwards NP [facility]  Contact [phone] Monday through Friday 8am- 5pm  After hours call [phone]

## 2020-09-10 ENCOUNTER — Non-Acute Institutional Stay (SKILLED_NURSING_FACILITY): Payer: Medicare PPO | Admitting: Adult Health

## 2020-09-10 ENCOUNTER — Encounter: Payer: Self-pay | Admitting: Adult Health

## 2020-09-10 DIAGNOSIS — G8111 Spastic hemiplegia affecting right dominant side: Secondary | ICD-10-CM

## 2020-09-10 DIAGNOSIS — E1151 Type 2 diabetes mellitus with diabetic peripheral angiopathy without gangrene: Secondary | ICD-10-CM

## 2020-09-10 DIAGNOSIS — I739 Peripheral vascular disease, unspecified: Secondary | ICD-10-CM | POA: Diagnosis not present

## 2020-09-10 NOTE — Progress Notes (Signed)
Location:    [facility] Room Number: 130/P Place of Service:  SNF (31)   CODE STATUS: Full Code  No Known Allergies  Chief Complaint  Patient presents with  . Acute Visit    Care Plan Meeting    HPI:  We have come together for her care plan meeting. Family present. BIMS unable; mood 2/30. No falls reports. He remains extensive assist to dependent for his adls. He is incontinent of bowel. Has foley.  His weight is 154 pounds his usual weight is near 160. He has lost 7 pounds over the past month. His family will bring foo in at times. Her has a variable intake. There are no reports of pain present. He has been started on low dose buspar without further reports of agitation or anxiety. He continues to be followed for his chronic illnesses including: Right spastic hemiplegia Peripheral vascular disease Type 2 diabetes mellitus with peripheral vascular disease  Past Medical History:  Diagnosis Date  . A-fib ([place])    a. Post-op afib after CABG 08/2012.  [name] Acute gastric ulcer with hemorrhage   . Acute respiratory failure with hypoxia ([place])   . Aphasia following cerebral infarction   . Atrial fibrillation ([place])   . CAD (coronary artery disease)    a. NSTEMI s/p stent to distal RCA 03/2000. b. Inf MI s/p emergent thrombectomy/stenting mid RCA 10/2000. c. NSTEMI s/p CABGx4 (LIMA-LAD, SVG-OM2, seq SVG-acute marginal and PD) 08/16/62 - course complicated by confusion, post-op AF, L pleural effusion with thoracentesis. d. Normal LV function by echo 08/2012.  . Diabetes mellitus   . Diverticulosis   . Dysphagia following cerebral infarction   . Encephalopathy   . Extended spectrum beta lactamase (ESBL) resistance   . Generalized anxiety disorder   . GERD (gastroesophageal reflux disease)   . Hemiplegia and hemiparesis following cerebral infarction affecting right dominant side ([place])   . Hiatal hernia   . HLD (hyperlipidemia)   . HTN (hypertension)   . Non-ST elevation  (NSTEMI) myocardial infarction ([place])   . Nontraumatic intracerebral hemorrhage in hemisphere, subcortical ([place])   . Peripheral vascular disease ([place])    a. Carotid dopplers neg 08/13/12. b. Pre-cabg ABIs - R=0.87 suggesting mild dz, L=1.29 possibly falsely elevated due to calcified vessels.  . Pleural effusion    a. L pleural eff after CABG s/p thoracentesis 08/22/12.  . Prostate cancer [facility])    Status post radiation treatment.  . Prostatic hypertrophy    a. Hx of urinary retention, awaiting TURP.  [name] Severe protein-calorie malnutrition ([place])   . Tobacco abuse   . 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 Valvular heart disease    a. Mild  MR by TEE 08/2012.    Past Surgical History:  Procedure Laterality Date  . APPENDECTOMY    . BACK SURGERY    . CORONARY ARTERY BYPASS GRAFT  08/16/2012   Procedure: CORONARY ARTERY BYPASS GRAFTING (CABG);  Surgeon: Melrose Nakayama, MD;  Location: [facility];  Service: Open Heart Surgery;  Laterality: N/A;  . IR REPLC GASTRO/COLONIC TUBE PERCUT W/FLUORO  02/13/2019  . LEFT HEART CATHETERIZATION WITH CORONARY ANGIOGRAM N/A 08/14/2012   Procedure: LEFT HEART CATHETERIZATION WITH CORONARY ANGIOGRAM;  Surgeon: Peter M Martinique, MD;  Location: [facility] CATH LAB;  Service: Cardiovascular;  Laterality: N/A;    Social History   Socioeconomic History  . Marital status: Married    Spouse name: Not on file  . Number of children: Not  on file  . Years of education: Not on file  . Highest education level: Not on file  Occupational History  . Occupation: retired   Tobacco Use  . Smoking status: Former Smoker    Quit date: 11/12/1972    Years since quitting: 47.8  . Smokeless tobacco: Never Used  Vaping Use  . Vaping Use: Never used  Substance and Sexual Activity  . Alcohol use: No  . Drug use: No  . Sexual activity: Not Currently  Other Topics Concern  . Not on file  Social History Narrative   He is a long term patient of [facility]    Social  Determinants of Health   Financial Resource Strain:   . Difficulty of Paying Living Expenses: Not on file  Food Insecurity:   . Worried About Charity fundraiser in the Last Year: Not on file  . Ran Out of Food in the Last Year: Not on file  Transportation Needs:   . Lack of Transportation (Medical): Not on file  . Lack of Transportation (Non-Medical): Not on file  Physical Activity: Inactive  . Days of Exercise per Week: 0 days  . Minutes of Exercise per Session: 0 min  Stress:   . Feeling of Stress : Not on file  Social Connections:   . Frequency of Communication with Friends and Family: Not on file  . Frequency of Social Gatherings with Friends and Family: Not on file  . Attends Religious Services: Not on file  . Active Member of Clubs or Organizations: Not on file  . Attends Archivist Meetings: Not on file  . Marital Status: Not on file  Intimate Partner Violence:   . Fear of Current or Ex-Partner: Not on file  . Emotionally Abused: Not on file  . Physically Abused: Not on file  . Sexually Abused: Not on file   Family History  Problem Relation Age of Onset  . Hypertension Mother       VITAL SIGNS BP 137/80   Pulse (!) 51   Temp 98 F (36.7 C)   Resp 20   Ht _0  (1.803 m)   Wt 154 lb (69.9 kg)   SpO2 100%   BMI 21.48 kg/m   Outpatient Encounter Medications as of 09/10/2020  Medication Sig  . acetaminophen (TYLENOL) 325 MG tablet Take 650 mg by mouth every 4 (four) hours as needed. per standing order for pain/fever DO NOT EXCEED >3,000 mg in 24 hours  . amLODipine (NORVASC) 5 MG tablet Take 5 mg by mouth daily.   [name] atorvastatin (LIPITOR) 40 MG tablet Take 40 mg by mouth at bedtime.   . baclofen (LIORESAL) 10 MG tablet Take 1 tablet by mouth 2 (two) times daily. Hold for sedation  . busPIRone (BUSPAR) 5 MG tablet Take 5 mg by mouth 2 (two) times daily. For anxiety  . Eyelid Cleansers (OCUSOFT LID SCRUB EX) Apply 1 each topically daily. Both eyes  .  FLUoxetine (PROZAC) 20 MG/5ML solution Take 20 mg by mouth daily.   . Glucerna (GLUCERNA) LIQD Take 237 mLs by mouth daily.  . hydrocortisone cream (PREPARATION H) 1 % Apply rectally twice daily and as needed for hemorrhoids  . Insulin Glargine (LANTUS SOLOSTAR) 100 UNIT/ML Solostar Pen Inject 20 Units into the skin daily.  . insulin lispro (HUMALOG) 100 UNIT/ML cartridge Inject 10 Units into the skin 3 (three) times daily before meals. Give 10 units SQ with meals if Blood Sugar >150  . lansoprazole (PREVACID  SOLUTAB) 30 MG disintegrating tablet Take 30 mg by mouth daily. Give before meals  . Melatonin 1 MG TABS Take 2 tablets by mouth at bedtime.   . metoprolol tartrate (LOPRESSOR) 25 MG tablet Take 12.5 mg by mouth 2 (two) times daily.   . NON FORMULARY Diet Type:  Dysphagia 3, thin no straws  . polyethylene glycol (MIRALAX / GLYCOLAX) 17 g packet Take 17 g by mouth daily as needed.  . valsartan (DIOVAN) 40 MG tablet Take 40 mg by mouth 2 (two) times daily.    No facility-administered encounter medications on file as of 09/10/2020.     SIGNIFICANT DIAGNOSTIC EXAMS   LABS REVIEWED PREVIOUS:    09-06-19: hgb a1c 6.4 10-07-19: chol 102;ldl 63; trig 81; hdl 23; mag 2.0 PSA 0.36 10-31-19: hemoccult: neg; hep C neg 11-01-19: hemoccult: neg 11-02-19: hemoccult: neg  01-15-20: wbc 7.7; hgb 16.5; hct 51.4; mcv 93.1 plt 188; glucose 99; bun 32; creat 1.49; k+ 4.2; na++ 142; ca 8.9; liver normal albumin 3.1; hgb a1c 6.5  05-07-20: urine for micro-albumin: 413.2 ( on ARB)  05-18-20: wbc 9.2; hgb 14.8; hct 45.8; mcv 92.5 plt 270; glucose 128; bun 32; creat 1.60 ;k+ 4.3; na++ 140; ca 8.6 alk phos 142 albumin 2.5 05-19-20: glucose 109; bun 34; creat 1.79; k+ 4.0; na++ 140; ca 8.2 alk phos 145; albumin 2.3  05-22-20: glucose 84; bun 26; creat 1.58; k+ 4.7; na++ 140; ca 8.5 05-25-20: glucose 96; bun 23; creat 1.42; k+ 4.0; na++ 139; ca 8.1  07-23-20: hgb a1c 6.2; chol 79; ldl 48; trig 53; hdl 20   NO NEW  LABS.   Review of Systems  Reason unable to perform ROS: aphasia     Physical Exam Constitutional:      General: He is not in acute distress.    Appearance: He is well-developed. He is not diaphoretic.  Neck:     Thyroid: No thyromegaly.  Cardiovascular:     Rate and Rhythm: Normal rate and regular rhythm.     Pulses: Normal pulses.     Heart sounds: Normal heart sounds.     Comments: History of CABG  Pulmonary:     Effort: Pulmonary effort is normal. No respiratory distress.     Breath sounds: Normal breath sounds.  Abdominal:     General: Bowel sounds are normal. There is no distension.     Palpations: Abdomen is soft.     Tenderness: There is no abdominal tenderness.  Genitourinary:    Comments: Foley penile tear  Musculoskeletal:     Cervical back: Neck supple.     Right lower leg: No edema.     Left lower leg: No edema.     Comments: Right hemiplegia   Lymphadenopathy:     Cervical: No cervical adenopathy.  Skin:    General: Skin is warm and dry.  Neurological:     Mental Status: He is alert. Mental status is at baseline.  Psychiatric:        Mood and Affect: Mood normal.       ASSESSMENT/ PLAN:  TODAY  1. Right spastic hemiplegia 2. Peripheral vascular disease 3. Type 2 diabetes mellitus with peripheral vascular disease  Will continue current medications Will continue current plan of care Will continue to monitor his status.   MD is aware of resident's narcotic use and is in agreement with current plan of care. We will attempt to wean resident as appropriate.  Ok Edwards NP [facility]  Contact [phone] Monday through Friday 8am- 5pm  After hours call [phone]

## 2020-09-25 DIAGNOSIS — F5105 Insomnia due to other mental disorder: Secondary | ICD-10-CM | POA: Diagnosis not present

## 2020-09-25 DIAGNOSIS — F333 Major depressive disorder, recurrent, severe with psychotic symptoms: Secondary | ICD-10-CM | POA: Diagnosis not present

## 2020-09-30 ENCOUNTER — Non-Acute Institutional Stay (SKILLED_NURSING_FACILITY): Payer: Medicare PPO | Admitting: Adult Health

## 2020-09-30 ENCOUNTER — Encounter: Payer: Self-pay | Admitting: Adult Health

## 2020-09-30 DIAGNOSIS — E1159 Type 2 diabetes mellitus with other circulatory complications: Secondary | ICD-10-CM

## 2020-09-30 DIAGNOSIS — E1129 Type 2 diabetes mellitus with other diabetic kidney complication: Secondary | ICD-10-CM | POA: Diagnosis not present

## 2020-09-30 DIAGNOSIS — I152 Hypertension secondary to endocrine disorders: Secondary | ICD-10-CM

## 2020-09-30 DIAGNOSIS — E785 Hyperlipidemia, unspecified: Secondary | ICD-10-CM

## 2020-09-30 DIAGNOSIS — E1169 Type 2 diabetes mellitus with other specified complication: Secondary | ICD-10-CM | POA: Diagnosis not present

## 2020-09-30 DIAGNOSIS — R809 Proteinuria, unspecified: Secondary | ICD-10-CM

## 2020-09-30 NOTE — Progress Notes (Signed)
Location:    [facility] Room Number: 12/21/2018 Place of Service:  SNF (31)   CODE STATUS: Full Code  No Known Allergies  Chief Complaint  Patient presents with   Medical Management of Chronic Issues           Hypertension associated with diabetes mellitus   Dyslipidemia associated with type 2 diabetes mellitus   Micro-albuminuria due to type 2 diabetes mellitus    HPI:  He is a 74 year old long term resident of this facility  being seen for the management of his chronic illnesses: Hypertension associated with diabetes mellitus   Dyslipidemia associated with type 2 diabetes mellitus   Micro-albuminuria due to type 2 diabetes mellitus there are no reports of uncontrolled pain; no reports of agitation or anxiety. No reports of insomnia.   Past Medical History:  Diagnosis Date   A-fib [facility])    a. Post-op afib after CABG 08/2012.   Acute gastric ulcer with hemorrhage    Acute respiratory failure with hypoxia ([facility])    Aphasia following cerebral infarction    Atrial fibrillation ([facility])    CAD (coronary artery disease)    a. NSTEMI s/p stent to distal RCA 03/2000. b. Inf MI s/p emergent thrombectomy/stenting mid RCA 10/2000. c. NSTEMI s/p CABGx4 (LIMA-LAD, SVG-OM2, seq SVG-acute marginal and PD) 4/0/98 - course complicated by confusion, post-op AF, L pleural effusion with thoracentesis. d. Normal LV function by echo 08/2012.   Diabetes mellitus    Diverticulosis    Dysphagia following cerebral infarction    Encephalopathy    Extended spectrum beta lactamase (ESBL) resistance    Generalized anxiety disorder    GERD (gastroesophageal reflux disease)    Hemiplegia and hemiparesis following cerebral infarction affecting right dominant side ([facility])    Hiatal hernia    HLD (hyperlipidemia)    HTN (hypertension)    Non-ST elevation (NSTEMI) myocardial infarction [facility]    Nontraumatic intracerebral hemorrhage in hemisphere, subcortical [facility])     Peripheral vascular disease ([facility])    a. Carotid dopplers neg 08/13/12. b. Pre-cabg ABIs - R=0.87 suggesting mild dz, L=1.29 possibly falsely elevated due to calcified vessels.   Pleural effusion    a. L pleural eff after CABG s/p thoracentesis 08/22/12.   Prostate cancer [facility])    Status post radiation treatment.   Prostatic hypertrophy    a. Hx of urinary retention, awaiting TURP.   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 Mild  MR by TEE 08/2012.    Past Surgical History:  Procedure Laterality Date   APPENDECTOMY     BACK SURGERY     CORONARY ARTERY BYPASS GRAFT  08/16/2012   Procedure: CORONARY ARTERY BYPASS GRAFTING (CABG);  Surgeon: Melrose Nakayama, MD;  Location: [place];  Service: Open Heart Surgery;  Laterality: N/A;   IR REPLC GASTRO/COLONIC TUBE PERCUT W/FLUORO  02/13/2019   LEFT HEART CATHETERIZATION WITH CORONARY ANGIOGRAM N/A 08/14/2012   Procedure: LEFT HEART CATHETERIZATION WITH CORONARY ANGIOGRAM;  Surgeon: Peter M Martinique, MD;  Location: [facility] CATH LAB;  Service: Cardiovascular;  Laterality: N/A;    Social History   Socioeconomic History   Marital status: Married    Spouse name: Not on file   Number of children: Not on file   Years of education: Not on file   Highest education level: Not on file  Occupational History  Occupation: retired   Tobacco Use   Smoking status: Former Smoker    Quit date: 11/12/1972    Years since quitting: 47.9   Smokeless tobacco: Never Used  Vaping Use   Vaping Use: Never used  Substance and Sexual Activity   Alcohol use: No   Drug use: No   Sexual activity: Not Currently  Other Topics Concern   Not on file  Social History Narrative   He is a long term patient of [facility]    Social Determinants of Health   Financial Resource Strain:    Difficulty of Paying Living Expenses: Not on file  Food Insecurity:     Worried About Charity fundraiser in the Last Year: Not on file   [employer] of Food in the Last Year: Not on file  Transportation Needs:    Lack of Transportation (Medical): Not on file   Lack of Transportation (Non-Medical): Not on file  Physical Activity: Inactive   Days of Exercise per Week: 0 days   Minutes of Exercise per Session: 0 min  Stress:    Feeling of Stress : Not on file  Social Connections:    Frequency of Communication with Friends and Family: Not on file   Frequency of Social Gatherings with Friends and Family: Not on file   Attends Religious Services: Not on file   Active Member of Clubs or Organizations: Not on file   Attends Archivist Meetings: Not on file   Marital Status: Not on file  Intimate Partner Violence:    Fear of Current or Ex-Partner: Not on file   Emotionally Abused: Not on file   Physically Abused: Not on file   Sexually Abused: Not on file   Family History  Problem Relation Age of Onset   Hypertension Mother       VITAL SIGNS BP (!) 144/73    Pulse (!) 58    Temp (!) 97.2 F (36.2 C)    Resp 20    Ht 5' 11"  (1.803 m)    Wt 159 lb 3.2 oz (72.2 kg)    BMI 22.20 kg/m   Outpatient Encounter Medications as of 09/30/2020  Medication Sig   acetaminophen (TYLENOL) 325 MG tablet Take 650 mg by mouth every 4 (four) hours as needed. per standing order for pain/fever DO NOT EXCEED >3,000 mg in 24 hours   amLODipine (NORVASC) 5 MG tablet Take 5 mg by mouth daily.    atorvastatin (LIPITOR) 40 MG tablet Take 40 mg by mouth at bedtime.    baclofen (LIORESAL) 10 MG tablet Take 1 tablet by mouth 2 (two) times daily. Hold for sedation   busPIRone (BUSPAR) 5 MG tablet Take 5 mg by mouth 2 (two) times daily. For anxiety   Eyelid Cleansers (OCUSOFT LID SCRUB EX) Apply 1 each topically daily. Both eyes   FLUoxetine (PROZAC) 20 MG/5ML solution Take 20 mg by mouth daily.    Glucerna (GLUCERNA) LIQD Take 237 mLs by mouth  daily.   hydrocortisone cream (PREPARATION H) 1 % Apply rectally twice daily and as needed for hemorrhoids   Insulin Glargine (LANTUS SOLOSTAR) 100 UNIT/ML Solostar Pen Inject 20 Units into the skin daily.   insulin lispro (HUMALOG) 100 UNIT/ML cartridge Inject 10 Units into the skin 3 (three) times daily before meals. Give 10 units SQ with meals if Blood Sugar >150   lansoprazole (PREVACID SOLUTAB) 30 MG disintegrating tablet Take 30 mg by mouth daily. Give before meals   Melatonin  1 MG TABS Take 2 tablets by mouth at bedtime.    metoprolol tartrate (LOPRESSOR) 25 MG tablet Take 12.5 mg by mouth 2 (two) times daily.    NON FORMULARY Diet Change: Regular, thin liquids   polyethylene glycol (MIRALAX / GLYCOLAX) 17 g packet Take 17 g by mouth daily as needed.   valsartan (DIOVAN) 40 MG tablet Take 40 mg by mouth 2 (two) times daily.    No facility-administered encounter medications on file as of 09/30/2020.     SIGNIFICANT DIAGNOSTIC EXAMS   LABS REVIEWED PREVIOUS:     10-07-19: chol 102;ldl 63; trig 81; hdl 23; mag 2.0 PSA 0.36 10-31-19: hemoccult: neg; hep C neg 11-01-19: hemoccult: neg 11-02-19: hemoccult: neg  01-15-20: wbc 7.7; hgb 16.5; hct 51.4; mcv 93.1 plt 188; glucose 99; bun 32; creat 1.49; k+ 4.2; na++ 142; ca 8.9; liver normal albumin 3.1; hgb a1c 6.5  05-07-20: urine for micro-albumin: 413.2 ( on ARB)  05-18-20: wbc 9.2; hgb 14.8; hct 45.8; mcv 92.5 plt 270; glucose 128; bun 32; creat 1.60 ;k+ 4.3; na++ 140; ca 8.6 alk phos 142 albumin 2.5 05-19-20: glucose 109; bun 34; creat 1.79; k+ 4.0; na++ 140; ca 8.2 alk phos 145; albumin 2.3  05-22-20: glucose 84; bun 26; creat 1.58; k+ 4.7; na++ 140; ca 8.5 05-25-20: glucose 96; bun 23; creat 1.42; k+ 4.0; na++ 139; ca 8.1  07-23-20: hgb a1c 6.2; chol 79; ldl 48; trig 53; hdl 20   NO NEW LABS.    Review of Systems  Reason unable to perform ROS: expressive aphasia    Physical Exam Constitutional:      General: He is not in  acute distress.    Appearance: He is well-developed. He is not diaphoretic.  Neck:     Thyroid: No thyromegaly.  Cardiovascular:     Rate and Rhythm: Normal rate and regular rhythm.     Pulses: Normal pulses.     Heart sounds: Normal heart sounds.     Comments: History of CABG  Pulmonary:     Effort: Pulmonary effort is normal. No respiratory distress.     Breath sounds: Normal breath sounds.  Abdominal:     General: Bowel sounds are normal. There is no distension.     Palpations: Abdomen is soft.     Tenderness: There is no abdominal tenderness.  Genitourinary:    Comments: Foley penile tear  Musculoskeletal:     Cervical back: Neck supple.     Right lower leg: No edema.     Left lower leg: No edema.     Comments:  Right hemiplegia    Lymphadenopathy:     Cervical: No cervical adenopathy.  Skin:    General: Skin is warm and dry.  Neurological:     Mental Status: He is alert. Mental status is at baseline.  Psychiatric:        Mood and Affect: Mood normal.      ASSESSMENT/ PLAN:  TODAY:   1. Hypertension associated with diabetes mellitus is stable b/p 144/73; will continue norvasc 5 mg daily and lopressor 12.5 mg twice daily diovan 40 mg daily   2. Dyslipidemia associated with type 2 diabetes mellitus is stable LDL 48 will continue lipitor 40 mg daily   3. Micro-albuminuria due to type 2 diabetes mellitus is without change is 413.2 is on ARB   PREVIOUS   4. Major depression with psychotic features: is stable will continue prozac 20 mg daily buspar 5 mg twice daily  is off antipsychotics.   5. CKD stage 3 due to type 2 diabetes mellitus: is stable bun 23; creat 1.42 will monitor   6. Chronic urine retention: is stable has long term foley will monitor  7. Type 2 diabetes mellitus with peripheral vascular disease hgb a1c 6. Will continue lantus 25 units nightly and novolog 10 units with meals  8. Protein calorie malnutrition severe: is stable weight is 159  pounds;  albumin 3.1 will monitor   9. Nontraumatic subcortical hemorrhage of left cerebral hemisphere/hemorrhagic cerebrovascular accident right hemiparesis is stable will continue baclofen 10 mg twice daily for spasticity  10. CAD native artery native heart without angina: is status post CABG 2012; is stable will continue lopressor 12.5 mg twice daily     MD is aware of resident's narcotic use and is in agreement with current plan of care. We will attempt to wean resident as appropriate.  Ok Edwards NP [facility]  Contact [phone] Monday through Friday 8am- 5pm  After hours call [phone]

## 2020-10-21 ENCOUNTER — Encounter: Payer: Self-pay | Admitting: Adult Health

## 2020-10-21 ENCOUNTER — Non-Acute Institutional Stay (SKILLED_NURSING_FACILITY): Payer: Medicare PPO | Admitting: Adult Health

## 2020-10-21 DIAGNOSIS — E1159 Type 2 diabetes mellitus with other circulatory complications: Secondary | ICD-10-CM

## 2020-10-21 DIAGNOSIS — I152 Hypertension secondary to endocrine disorders: Secondary | ICD-10-CM

## 2020-10-21 DIAGNOSIS — I4811 Longstanding persistent atrial fibrillation: Secondary | ICD-10-CM | POA: Diagnosis not present

## 2020-10-21 NOTE — Progress Notes (Signed)
Location:    [place] Room Number: 130/P Place of Service:  SNF (31)   CODE STATUS: Full Code  No Known Allergies  Chief Complaint  Patient presents with  . Acute Visit    Hypertension, and Bradycardia    HPI:  His pulse has been running in the 50's. His blood pressure readings have been slightly elevated in the 140-150's. He is presently taking lopressor 12.5 mg twice daily valsartan 40 mg daily and norvasc 5 mg daily there are no reports of pain; no reports of agitation or excessive sleeping.   Past Medical History:  Diagnosis Date  . A-fib ([place])    a. Post-op afib after CABG 08/2012.  [name] Acute gastric ulcer with hemorrhage   . Acute respiratory failure with hypoxia ([place])   . Aphasia following cerebral infarction   . Atrial fibrillation ([place])   . CAD (coronary artery disease)    a. NSTEMI s/p stent to distal RCA 03/2000. b. Inf MI s/p emergent thrombectomy/stenting mid RCA 10/2000. c. NSTEMI s/p CABGx4 (LIMA-LAD, SVG-OM2, seq SVG-acute marginal and PD) 12/15/46 - course complicated by confusion, post-op AF, L pleural effusion with thoracentesis. d. Normal LV function by echo 08/2012.  . Diabetes mellitus   . Diverticulosis   . Dysphagia following cerebral infarction   . Encephalopathy   . Extended spectrum beta lactamase (ESBL) resistance   . Generalized anxiety disorder   . GERD (gastroesophageal reflux disease)   . Hemiplegia and hemiparesis following cerebral infarction affecting right dominant side ([place])   . Hiatal hernia   . HLD (hyperlipidemia)   . HTN (hypertension)   . Non-ST elevation (NSTEMI) myocardial infarction ([place])   . Nontraumatic intracerebral hemorrhage in hemisphere, subcortical ([place])   . Peripheral vascular disease ([place])    a. Carotid dopplers neg 08/13/12. b. Pre-cabg ABIs - R=0.87 suggesting mild dz, L=1.29 possibly falsely elevated due to calcified vessels.  . Pleural effusion    a. L pleural eff after CABG s/p thoracentesis  08/22/12.  . Prostate cancer [facility])    Status post radiation treatment.  . Prostatic hypertrophy    a. Hx of urinary retention, awaiting TURP.  [name] Severe protein-calorie malnutrition ([place])   . Tobacco abuse   . 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 Valvular heart disease    a. Mild  MR by TEE 08/2012.    Past Surgical History:  Procedure Laterality Date  . APPENDECTOMY    . BACK SURGERY    . CORONARY ARTERY BYPASS GRAFT  08/16/2012   Procedure: CORONARY ARTERY BYPASS GRAFTING (CABG);  Surgeon: Melrose Nakayama, MD;  Location: [place];  Service: Open Heart Surgery;  Laterality: N/A;  . IR REPLC GASTRO/COLONIC TUBE PERCUT W/FLUORO  02/13/2019  . LEFT HEART CATHETERIZATION WITH CORONARY ANGIOGRAM N/A 08/14/2012   Procedure: LEFT HEART CATHETERIZATION WITH CORONARY ANGIOGRAM;  Surgeon: Peter M Martinique, MD;  Location: [facility] C[facility] LAB;  Service: Cardiovascular;  Laterality: N/A;    Social History   Socioeconomic History  . Marital status: Married    Spouse name: Not on file  . Number of children: Not on file  . Years of education: Not on file  . Highest education level: Not on file  Occupational History  . Occupation: retired   Tobacco Use  . Smoking status: Former Smoker    Quit date: 11/12/1972    Years since quitting: 47.9  . Smokeless tobacco: Never Used  Vaping Use  . Vaping Use: Never used  Substance and Sexual Activity  . Alcohol use: No  . Drug use: No  . Sexual activity: Not Currently  Other Topics Concern  . Not on file  Social History Narrative   He is a long term patient of [facility]    Social Determinants of Health   Financial Resource Strain:   . Difficulty of Paying Living Expenses: Not on file  Food Insecurity:   . Worried About Charity fundraiser in the Last Year: Not on file  . Ran Out of Food in the Last Year: Not on file  Transportation Needs:   . Lack of Transportation (Medical): Not on file  . Lack of Transportation (Non-Medical): Not on  file  Physical Activity: Inactive  . Days of Exercise per Week: 0 days  . Minutes of Exercise per Session: 0 min  Stress:   . Feeling of Stress : Not on file  Social Connections:   . Frequency of Communication with Friends and Family: Not on file  . Frequency of Social Gatherings with Friends and Family: Not on file  . Attends Religious Services: Not on file  . Active Member of Clubs or Organizations: Not on file  . Attends Archivist Meetings: Not on file  . Marital Status: Not on file  Intimate Partner Violence:   . Fear of Current or Ex-Partner: Not on file  . Emotionally Abused: Not on file  . Physically Abused: Not on file  . Sexually Abused: Not on file   Family History  Problem Relation Age of Onset  . Hypertension Mother       VITAL SIGNS BP 140/71   Pulse (!) 53   Temp 97.9 F (36.6 C)   Resp 20   Ht 5' 11"  (1.803 m)   Wt 165 lb 9.6 oz (75.1 kg)   BMI 23.10 kg/m   Outpatient Encounter Medications as of 10/21/2020  Medication Sig  . acetaminophen (TYLENOL) 325 MG tablet Take 650 mg by mouth every 4 (four) hours as needed. per standing order for pain/fever DO NOT EXCEED >3,000 mg in 24 hours  . amLODipine (NORVASC) 5 MG tablet Take 5 mg by mouth daily.   [name] atorvastatin (LIPITOR) 40 MG tablet Take 40 mg by mouth at bedtime.   . baclofen (LIORESAL) 10 MG tablet Take 1 tablet by mouth 2 (two) times daily. Hold for sedation  . busPIRone (BUSPAR) 5 MG tablet Take 5 mg by mouth 2 (two) times daily. For anxiety  . Eyelid Cleansers (OCUSOFT LID SCRUB EX) Apply 1 each topically daily. Both eyes  . FLUoxetine (PROZAC) 20 MG/5ML solution Take 20 mg by mouth daily.   . Glucerna (GLUCERNA) LIQD Take 237 mLs by mouth daily.  . hydrocortisone cream (PREPARATION H) 1 % Apply rectally twice daily and as needed for hemorrhoids  . Insulin Glargine (LANTUS SOLOSTAR) 100 UNIT/ML Solostar Pen Inject 20 Units into the skin daily.  . insulin lispro (HUMALOG) 100 UNIT/ML  cartridge Inject 10 Units into the skin 3 (three) times daily before meals. Give 10 units SQ with meals if Blood Sugar >150  . lansoprazole (PREVACID SOLUTAB) 30 MG disintegrating tablet Take 30 mg by mouth daily. Give before meals  . Melatonin 1 MG TABS Take 2 tablets by mouth at bedtime.   . metoprolol tartrate (LOPRESSOR) 25 MG tablet Take 12.5 mg by mouth 2 (two) times daily.   . NON FORMULARY Diet Change: Regular, thin liquids  . polyethylene glycol (MIRALAX / GLYCOLAX) 17 g packet Take  17 g by mouth daily as needed.  . valsartan (DIOVAN) 40 MG tablet Take 40 mg by mouth 2 (two) times daily.    No facility-administered encounter medications on file as of 10/21/2020.     SIGNIFICANT DIAGNOSTIC EXAMS  LABS REVIEWED PREVIOUS:    10-31-19: hemoccult: neg; hep C neg 11-01-19: hemoccult: neg 11-02-19: hemoccult: neg  01-15-20: wbc 7.7; hgb 16.5; hct 51.4; mcv 93.1 plt 188; glucose 99; bun 32; creat 1.49; k+ 4.2; na++ 142; ca 8.9; liver normal albumin 3.1; hgb a1c 6.5  05-07-20: urine for micro-albumin: 413.2 ( on ARB)  05-18-20: wbc 9.2; hgb 14.8; hct 45.8; mcv 92.5 plt 270; glucose 128; bun 32; creat 1.60 ;k+ 4.3; na++ 140; ca 8.6 alk phos 142 albumin 2.5 05-19-20: glucose 109; bun 34; creat 1.79; k+ 4.0; na++ 140; ca 8.2 alk phos 145; albumin 2.3  05-22-20: glucose 84; bun 26; creat 1.58; k+ 4.7; na++ 140; ca 8.5 05-25-20: glucose 96; bun 23; creat 1.42; k+ 4.0; na++ 139; ca 8.1  07-23-20: hgb a1c 6.2; chol 79; ldl 48; trig 53; hdl 20   NO NEW LABS.   Review of Systems  Reason unable to perform ROS: expressive aphasia     Physical Exam Constitutional:      General: He is not in acute distress.    Appearance: He is well-developed. He is not diaphoretic.  Neck:     Thyroid: No thyromegaly.  Cardiovascular:     Rate and Rhythm: Normal rate and regular rhythm.     Pulses: Normal pulses.     Heart sounds: Normal heart sounds.     Comments: History of CABG  Pulmonary:     Effort:  Pulmonary effort is normal. No respiratory distress.     Breath sounds: Normal breath sounds.  Abdominal:     General: Bowel sounds are normal. There is no distension.     Palpations: Abdomen is soft.     Tenderness: There is no abdominal tenderness.  Genitourinary:    Comments: Foley penile tear  Musculoskeletal:     Cervical back: Neck supple.     Right lower leg: No edema.     Left lower leg: No edema.     Comments: Right hemiplegia   Lymphadenopathy:     Cervical: No cervical adenopathy.  Skin:    General: Skin is warm and dry.  Neurological:     Mental Status: He is alert. Mental status is at baseline.  Psychiatric:        Mood and Affect: Mood normal.        ASSESSMENT/ PLAN:  TODAY  1. Longstanding persistent atrial fibrillation 2. Hypertension associated with type 2 diabetes.   Will lower lopressor to 12.5 mg daily through 10-28-20 then stop Will increase valsartan to 80 mg daily  Will check BMP 10-29-20.     MD is aware of resident's narcotic use and is in agreement with current plan of care. We will attempt to wean resident as appropriate.  Ok Edwards NP [facility]  Contact [phone] Monday through Friday 8am- 5pm  After hours call [phone]

## 2020-10-23 DIAGNOSIS — E1151 Type 2 diabetes mellitus with diabetic peripheral angiopathy without gangrene: Secondary | ICD-10-CM | POA: Diagnosis not present

## 2020-10-23 DIAGNOSIS — Z794 Long term (current) use of insulin: Secondary | ICD-10-CM | POA: Diagnosis not present

## 2020-10-23 DIAGNOSIS — M2042 Other hammer toe(s) (acquired), left foot: Secondary | ICD-10-CM | POA: Diagnosis not present

## 2020-10-23 DIAGNOSIS — F5105 Insomnia due to other mental disorder: Secondary | ICD-10-CM | POA: Diagnosis not present

## 2020-10-23 DIAGNOSIS — F333 Major depressive disorder, recurrent, severe with psychotic symptoms: Secondary | ICD-10-CM | POA: Diagnosis not present

## 2020-10-23 DIAGNOSIS — B351 Tinea unguium: Secondary | ICD-10-CM | POA: Diagnosis not present

## 2020-10-29 ENCOUNTER — Other Ambulatory Visit (HOSPITAL_COMMUNITY)
Admission: RE | Admit: 2020-10-29 | Discharge: 2020-10-29 | Disposition: A | Discharge status: Home / Self Care | Payer: Medicare PPO | Source: Skilled Nursing Facility | Attending: Adult Health | Admitting: Adult Health

## 2020-10-29 DIAGNOSIS — I1310 Hypertensive heart and chronic kidney disease without heart failure, with stage 1 through stage 4 chronic kidney disease, or unspecified chronic kidney disease: Secondary | ICD-10-CM | POA: Diagnosis not present

## 2020-10-29 LAB — BASIC METABOLIC PANEL
Anion gap: 5 (ref 5–15)
BUN: 39 mg/dL — ABNORMAL HIGH (ref 8–23)
CO2: 30 mmol/L (ref 22–32)
Calcium: 8.5 mg/dL — ABNORMAL LOW (ref 8.9–10.3)
Chloride: 105 mmol/L (ref 98–111)
Creatinine, Ser: 1.64 mg/dL — ABNORMAL HIGH (ref 0.61–1.24)
GFR, Estimated: 43 mL/min — ABNORMAL LOW (ref >60)
Glucose, Bld: 103 mg/dL — ABNORMAL HIGH (ref 70–99)
Potassium: 4 mmol/L (ref 3.5–5.1)
Sodium: 140 mmol/L (ref 135–145)

## 2020-10-30 ENCOUNTER — Non-Acute Institutional Stay (SKILLED_NURSING_FACILITY): Payer: Medicare PPO | Admitting: Adult Health

## 2020-10-30 ENCOUNTER — Encounter: Payer: Self-pay | Admitting: Adult Health

## 2020-10-30 DIAGNOSIS — N183 Chronic kidney disease, stage 3 unspecified: Secondary | ICD-10-CM | POA: Diagnosis not present

## 2020-10-30 DIAGNOSIS — R339 Retention of urine, unspecified: Secondary | ICD-10-CM | POA: Diagnosis not present

## 2020-10-30 DIAGNOSIS — F323 Major depressive disorder, single episode, severe with psychotic features: Secondary | ICD-10-CM | POA: Diagnosis not present

## 2020-10-30 DIAGNOSIS — E1122 Type 2 diabetes mellitus with diabetic chronic kidney disease: Secondary | ICD-10-CM

## 2020-10-30 NOTE — Progress Notes (Signed)
Location:    [place] Room Number: 833-X Place of Service:  SNF (31)   CODE STATUS: Full Code  No Known Allergies  Chief Complaint  Patient presents with  . Medical Management of Chronic Issues           Major depression with psychotic features:   CKD stage 3 due to type 2 diabetes mellitus:     Chronic urine retention     HPI:  He is a 74 year old long term resident of this facility being seen for the management of his chronic illnesses: Major depression with psychotic features:   CKD stage 3 due to type 2 diabetes mellitus:     Chronic urine retention. There are no reports of agitation; no reports of uncontrolled pain; no reports of changes in appetite; no constipation.   Past Medical History:  Diagnosis Date  . A-fib ([place])    a. Post-op afib after CABG 08/2012.  [name] Acute gastric ulcer with hemorrhage   . Acute respiratory failure with hypoxia ([place])   . Aphasia following cerebral infarction   . Atrial fibrillation ([place])   . CAD (coronary artery disease)    a. NSTEMI s/p stent to distal RCA 03/2000. b. Inf MI s/p emergent thrombectomy/stenting mid RCA 10/2000. c. NSTEMI s/p CABGx4 (LIMA-LAD, SVG-OM2, seq SVG-acute marginal and PD) 07/14/28 - course complicated by confusion, post-op AF, L pleural effusion with thoracentesis. d. Normal LV function by echo 08/2012.  . Diabetes mellitus   . Diverticulosis   . Dysphagia following cerebral infarction   . Encephalopathy   . Extended spectrum beta lactamase (ESBL) resistance   . Generalized anxiety disorder   . GERD (gastroesophageal reflux disease)   . Hemiplegia and hemiparesis following cerebral infarction affecting right dominant side ([place])   . Hiatal hernia   . HLD (hyperlipidemia)   . HTN (hypertension)   . Non-ST elevation (NSTEMI) myocardial infarction ([place])   . Nontraumatic intracerebral hemorrhage in hemisphere, subcortical ([place])   . Peripheral vascular disease ([place])    a. Carotid dopplers neg 08/13/12.  b. Pre-cabg ABIs - R=0.87 suggesting mild dz, L=1.29 possibly falsely elevated due to calcified vessels.  . Pleural effusion    a. L pleural eff after CABG s/p thoracentesis 08/22/12.  . Prostate cancer [facility])    Status post radiation treatment.  . Prostatic hypertrophy    a. Hx of urinary retention, awaiting TURP.  [name] Severe protein-calorie malnutrition ([place])   . Tobacco abuse   . 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 Valvular heart disease    a. Mild  MR by TEE 08/2012.    Past Surgical History:  Procedure Laterality Date  . APPENDECTOMY    . BACK SURGERY    . CORONARY ARTERY BYPASS GRAFT  08/16/2012   Procedure: CORONARY ARTERY BYPASS GRAFTING (CABG);  Surgeon: Melrose Nakayama, MD;  Location: [place];  Service: Open Heart Surgery;  Laterality: N/A;  . IR REPLC GASTRO/COLONIC TUBE PERCUT W/FLUORO  02/13/2019  . LEFT HEART CATHETERIZATION WITH CORONARY ANGIOGRAM N/A 08/14/2012   Procedure: LEFT HEART CATHETERIZATION WITH CORONARY ANGIOGRAM;  Surgeon: Peter M Martinique, MD;  Location: [facility] CATH LAB;  Service: Cardiovascular;  Laterality: N/A;    Social History   Socioeconomic History  . Marital status: Married    Spouse name: Not on file  . Number of children: Not on file  . Years of education: Not on file  . Highest education level: Not on file  Occupational History  . Occupation: retired   Tobacco Use  . Smoking status: Former Smoker    Quit date: 11/12/1972    Years since quitting: 47.9  . Smokeless tobacco: Never Used  Vaping Use  . Vaping Use: Never used  Substance and Sexual Activity  . Alcohol use: No  . Drug use: No  . Sexual activity: Not Currently  Other Topics Concern  . Not on file  Social History Narrative   He is a long term patient of [facility]    Social Determinants of Health   Financial Resource Strain:   . Difficulty of Paying Living Expenses: Not on file  Food Insecurity:   . Worried About Charity fundraiser in the Last Year: Not on file   . Ran Out of Food in the Last Year: Not on file  Transportation Needs:   . Lack of Transportation (Medical): Not on file  . Lack of Transportation (Non-Medical): Not on file  Physical Activity:   . Days of Exercise per Week: Not on file  . Minutes of Exercise per Session: Not on file  Stress:   . Feeling of Stress : Not on file  Social Connections:   . Frequency of Communication with Friends and Family: Not on file  . Frequency of Social Gatherings with Friends and Family: Not on file  . Attends Religious Services: Not on file  . Active Member of Clubs or Organizations: Not on file  . Attends Archivist Meetings: Not on file  . Marital Status: Not on file  Intimate Partner Violence:   . Fear of Current or Ex-Partner: Not on file  . Emotionally Abused: Not on file  . Physically Abused: Not on file  . Sexually Abused: Not on file   Family History  Problem Relation Age of Onset  . Hypertension Mother       VITAL SIGNS BP 123/68   Pulse 62   Temp (!) 96.3 F (35.7 C)   Resp 20   Ht _0  (1.803 m)   Wt 165 lb 9.6 oz (75.1 kg)   SpO2 100%   BMI 23.10 kg/m   Outpatient Encounter Medications as of 10/30/2020  Medication Sig  . acetaminophen (TYLENOL) 325 MG tablet Take 650 mg by mouth every 4 (four) hours as needed. per standing order for pain/fever DO NOT EXCEED >3,000 mg in 24 hours  . amLODipine (NORVASC) 5 MG tablet Take 5 mg by mouth daily.   [name] atorvastatin (LIPITOR) 40 MG tablet Take 40 mg by mouth at bedtime.   . baclofen (LIORESAL) 10 MG tablet Take 1 tablet by mouth 2 (two) times daily. Hold for sedation  . busPIRone (BUSPAR) 5 MG tablet Take 5 mg by mouth 2 (two) times daily. For anxiety  . Eyelid Cleansers (OCUSOFT LID SCRUB EX) Apply 1 each topically daily. Both eyes  . FLUoxetine (PROZAC) 20 MG/5ML solution Take 20 mg by mouth daily.   . Glucerna (GLUCERNA) LIQD Take 237 mLs by mouth daily.  . hydrocortisone cream (PREPARATION H) 1 % Apply  rectally twice daily and as needed for hemorrhoids  . Insulin Glargine (LANTUS SOLOSTAR) 100 UNIT/ML Solostar Pen Inject 20 Units into the skin daily.  . insulin lispro (HUMALOG) 100 UNIT/ML cartridge Inject 10 Units into the skin 3 (three) times daily before meals. Give 10 units SQ with meals if Blood Sugar >150  . lansoprazole (PREVACID SOLUTAB) 30 MG disintegrating tablet Take 30 mg by mouth daily. Give before meals  .  Melatonin 1 MG TABS Take 2 tablets by mouth at bedtime.   . NON FORMULARY Diet Change: Regular, thin liquids  . polyethylene glycol (MIRALAX / GLYCOLAX) 17 g packet Take 17 g by mouth daily as needed.  . valsartan (DIOVAN) 80 MG tablet Take 80 mg by mouth daily.  . [DISCONTINUED] metoprolol tartrate (LOPRESSOR) 25 MG tablet Take 12.5 mg by mouth 2 (two) times daily.   . [DISCONTINUED] valsartan (DIOVAN) 40 MG tablet Take 40 mg by mouth 2 (two) times daily.  (Patient not taking: Reported on 10/30/2020)   No facility-administered encounter medications on file as of 10/30/2020.     SIGNIFICANT DIAGNOSTIC EXAMS   LABS REVIEWED PREVIOUS:    10-31-19: hemoccult: neg; hep C neg 11-01-19: hemoccult: neg 11-02-19: hemoccult: neg  01-15-20: wbc 7.7; hgb 16.5; hct 51.4; mcv 93.1 plt 188; glucose 99; bun 32; creat 1.49; k+ 4.2; na++ 142; ca 8.9; liver normal albumin 3.1; hgb a1c 6.5  05-07-20: urine for micro-albumin: 413.2 ( on ARB)  05-18-20: wbc 9.2; hgb 14.8; hct 45.8; mcv 92.5 plt 270; glucose 128; bun 32; creat 1.60 ;k+ 4.3; na++ 140; ca 8.6 alk phos 142 albumin 2.5 05-19-20: glucose 109; bun 34; creat 1.79; k+ 4.0; na++ 140; ca 8.2 alk phos 145; albumin 2.3  05-22-20: glucose 84; bun 26; creat 1.58; k+ 4.7; na++ 140; ca 8.5 05-25-20: glucose 96; bun 23; creat 1.42; k+ 4.0; na++ 139; ca 8.1  07-23-20: hgb a1c 6.2; chol 79; ldl 48; trig 53; hdl 20   TODAY  10-29-20: glucose 103; bun 39; creat 1.64; k+ 4.0; na++ 140; ca 8.5   Review of Systems  Reason unable to perform ROS:  epxressive aphasia     Physical Exam Constitutional:      General: He is not in acute distress.    Appearance: He is well-developed. He is not diaphoretic.  Neck:     Thyroid: No thyromegaly.  Cardiovascular:     Rate and Rhythm: Normal rate and regular rhythm.     Heart sounds: Normal heart sounds.     Comments:  History of CABG  Pulmonary:     Effort: Pulmonary effort is normal. No respiratory distress.     Breath sounds: Normal breath sounds.  Abdominal:     General: Bowel sounds are normal. There is no distension.     Palpations: Abdomen is soft.     Tenderness: There is no abdominal tenderness.  Genitourinary:    Comments: Foley penile tear  Musculoskeletal:     Cervical back: Neck supple.     Right lower leg: No edema.     Left lower leg: No edema.     Comments: Right hemiplegia    Lymphadenopathy:     Cervical: No cervical adenopathy.  Skin:    General: Skin is warm and dry.  Neurological:     Mental Status: He is alert. Mental status is at baseline.  Psychiatric:        Mood and Affect: Mood normal.        ASSESSMENT/ PLAN:  TODAY:   1. Major depression with psychotic features: is stable will continue prozac 20 mg daily buspar 5 mg twice daily is off antipsychotics  2. CKD stage 3 due to type 2 diabetes mellitus: is stable bun 39; creat 1.64 will monitor  3. Chronic urine retention is stable has long term foley will monitor   PREVIOUS   4. Type 2 diabetes mellitus with peripheral vascular disease hgb a1c 6.2 Will  continue lantus 25 units nightly and novolog 10 units with meals  5. Protein calorie malnutrition severe: is stable weight is 165  pounds; albumin 3.1 will monitor   6. Nontraumatic subcortical hemorrhage of left cerebral hemisphere/hemorrhagic cerebrovascular accident right hemiparesis is stable will continue baclofen 10 mg twice daily for spasticity  7. CAD native artery native heart without angina: is status post CABG 2012; is stable will  continue diovan 80 mg daily   8. Hypertension associated with diabetes mellitus is stable b/p 123/68; will continue norvasc 5 mg daily and  diovan 80 mg daily   9. Dyslipidemia associated with type 2 diabetes mellitus is stable LDL 48 will continue lipitor 40 mg daily   10. Micro-albuminuria due to type 2 diabetes mellitus is without change is 413.2 is on ARB     MD is aware of resident's narcotic use and is in agreement with current plan of care. We will attempt to wean resident as appropriate.  Ok Edwards NP [facility]  Contact [phone] Monday through Friday 8am- 5pm  After hours call [phone]

## 2020-11-02 ENCOUNTER — Encounter: Payer: Self-pay | Admitting: Adult Health

## 2020-11-02 ENCOUNTER — Non-Acute Institutional Stay (INDEPENDENT_AMBULATORY_CARE_PROVIDER_SITE_OTHER): Payer: Medicare PPO | Admitting: Adult Health

## 2020-11-02 DIAGNOSIS — Z0000 Encounter for general adult medical examination without abnormal findings: Secondary | ICD-10-CM

## 2020-11-02 NOTE — Progress Notes (Signed)
Subjective:   [name] is a 74 y.o. male who presents for Medicare Annual/Subsequent preventive examination.  Review of Systems    Review of Systems  Reason unable to perform ROS: expressive aphasia     Cardiac Risk Factors include: advanced age (>54men, >85 women);diabetes mellitus;dyslipidemia;hypertension;sedentary lifestyle     Objective:    Today's Vitals   11/02/20 1014  BP: 138/78  Pulse: (!) 56  Resp: 20  Temp: (!) 97.4 F (36.3 C)  SpO2: 100%  Weight: 165 lb 9.6 oz (75.1 kg)  Height: 5\' 11"  (1.803 m)   Body mass index is 23.1 kg/m.  Advanced Directives 11/02/2020 10/30/2020 10/21/2020 09/30/2020 09/10/2020 09/08/2020 08/26/2020  Does Patient Have a Medical Advance Directive? Yes Yes Yes Yes Yes Yes Yes  Type of Advance Directive - Out of facility DNR (pink MOST or yellow form) Out of facility DNR (pink MOST or yellow form) Out of facility DNR (pink MOST or yellow form) Out of facility DNR (pink MOST or yellow form) Out of facility DNR (pink MOST or yellow form) (No Data)  Does patient want to make changes to medical advance directive? No - Patient declined No - Patient declined No - Patient declined No - Patient declined No - Patient declined No - Patient declined No - Patient declined  Would patient like information on creating a medical advance directive? - - - - - - -  Pre-existing out of facility DNR order (yellow form or pink MOST form) - Pink MOST form placed in chart (order not valid for inpatient use) Pink MOST form placed in chart (order not valid for inpatient use) Pink MOST form placed in chart (order not valid for inpatient use) Pink MOST form placed in chart (order not valid for inpatient use) Pink MOST form placed in chart (order not valid for inpatient use) Pink MOST form placed in chart (order not valid for inpatient use)    Current Medications (verified) Outpatient Encounter Medications as of 11/02/2020  Medication Sig  . acetaminophen (TYLENOL)  325 MG tablet Take 650 mg by mouth every 4 (four) hours as needed. per standing order for pain/fever DO NOT EXCEED >3,000 mg in 24 hours  . amLODipine (NORVASC) 5 MG tablet Take 5 mg by mouth daily.   [name] atorvastatin (LIPITOR) 40 MG tablet Take 40 mg by mouth at bedtime.   . baclofen (LIORESAL) 10 MG tablet Take 1 tablet by mouth 2 (two) times daily. Hold for sedation  . busPIRone (BUSPAR) 5 MG tablet Take 5 mg by mouth 2 (two) times daily. For anxiety  . Eyelid Cleansers (OCUSOFT LID SCRUB EX) Apply 1 each topically daily. Both eyes  . FLUoxetine (PROZAC) 20 MG/5ML solution Take 20 mg by mouth daily.   . Glucerna (GLUCERNA) LIQD Take 237 mLs by mouth daily.  . hydrocortisone cream (PREPARATION H) 1 % Apply rectally twice daily and as needed for hemorrhoids  . Insulin Glargine (LANTUS SOLOSTAR) 100 UNIT/ML Solostar Pen Inject 20 Units into the skin daily.  . insulin lispro (HUMALOG) 100 UNIT/ML cartridge Inject 10 Units into the skin 3 (three) times daily before meals. Give 10 units SQ with meals if Blood Sugar >150  . lansoprazole (PREVACID SOLUTAB) 30 MG disintegrating tablet Take 30 mg by mouth daily. Give before meals  . Melatonin 1 MG TABS Take 2 tablets by mouth at bedtime.   . NON FORMULARY Diet Change: Regular, thin liquids  . polyethylene glycol (MIRALAX / GLYCOLAX) 17 g packet Take 17 g  by mouth daily as needed.  . valsartan (DIOVAN) 80 MG tablet Take 80 mg by mouth daily.   No facility-administered encounter medications on file as of 11/02/2020.    Allergies (verified) Patient has no known allergies.   History: Past Medical History:  Diagnosis Date  . A-fib ([place])    a. Post-op afib after CABG 08/2012.  [name] Acute gastric ulcer with hemorrhage   . Acute respiratory failure with hypoxia ([place])   . Aphasia following cerebral infarction   . Atrial fibrillation ([place])   . CAD (coronary artery disease)    a. NSTEMI s/p stent to distal RCA 03/2000. b. Inf MI s/p emergent  thrombectomy/stenting mid RCA 10/2000. c. NSTEMI s/p CABGx4 (LIMA-LAD, SVG-OM2, seq SVG-acute marginal and PD) 06/16/18 - course complicated by confusion, post-op AF, L pleural effusion with thoracentesis. d. Normal LV function by echo 08/2012.  . Diabetes mellitus   . Diverticulosis   . Dysphagia following cerebral infarction   . Encephalopathy   . Extended spectrum beta lactamase (ESBL) resistance   . Generalized anxiety disorder   . GERD (gastroesophageal reflux disease)   . Hemiplegia and hemiparesis following cerebral infarction affecting right dominant side ([place])   . Hiatal hernia   . HLD (hyperlipidemia)   . HTN (hypertension)   . Non-ST elevation (NSTEMI) myocardial infarction ([place])   . Nontraumatic intracerebral hemorrhage in hemisphere, subcortical ([place])   . Peripheral vascular disease ([place])    a. Carotid dopplers neg 08/13/12. b. Pre-cabg ABIs - R=0.87 suggesting mild dz, L=1.29 possibly falsely elevated due to calcified vessels.  . Pleural effusion    a. L pleural eff after CABG s/p thoracentesis 08/22/12.  . Prostate cancer [facility])    Status post radiation treatment.  . Prostatic hypertrophy    a. Hx of urinary retention, awaiting TURP.  [name] Severe protein-calorie malnutrition ([place])   . Tobacco abuse   . 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 Valvular heart disease    a. Mild  MR by TEE 08/2012.   Past Surgical History:  Procedure Laterality Date  . APPENDECTOMY    . BACK SURGERY    . CORONARY ARTERY BYPASS GRAFT  08/16/2012   Procedure: CORONARY ARTERY BYPASS GRAFTING (CABG);  Surgeon: Melrose Nakayama, MD;  Location: [facility];  Service: Open Heart Surgery;  Laterality: N/A;  . IR REPLC GASTRO/COLONIC TUBE PERCUT W/FLUORO  02/13/2019  . LEFT HEART CATHETERIZATION WITH CORONARY ANGIOGRAM N/A 08/14/2012   Procedure: LEFT HEART CATHETERIZATION WITH CORONARY ANGIOGRAM;  Surgeon: Peter M Martinique, MD;  Location: [facility] C[facility] LAB;  Service: Cardiovascular;  Laterality: N/A;    Family History  Problem Relation Age of Onset  . Hypertension Mother    Social History   Socioeconomic History  . Marital status: Married    Spouse name: Not on file  . Number of children: Not on file  . Years of education: Not on file  . Highest education level: Not on file  Occupational History  . Occupation: retired   Tobacco Use  . Smoking status: Former Smoker    Quit date: 11/12/1972    Years since quitting: 48.0  . Smokeless tobacco: Never Used  Vaping Use  . Vaping Use: Never used  Substance and Sexual Activity  . Alcohol use: No  . Drug use: No  . Sexual activity: Not Currently  Other Topics Concern  . Not on file  Social History Narrative   He is a long term patient of [facility]  Social Determinants of Health   Financial Resource Strain:   . Difficulty of Paying Living Expenses: Not on file  Food Insecurity:   . Worried About Charity fundraiser in the Last Year: Not on file  . Ran Out of Food in the Last Year: Not on file  Transportation Needs:   . Lack of Transportation (Medical): Not on file  . Lack of Transportation (Non-Medical): Not on file  Physical Activity:   . Days of Exercise per Week: Not on file  . Minutes of Exercise per Session: Not on file  Stress:   . Feeling of Stress : Not on file  Social Connections:   . Frequency of Communication with Friends and Family: Not on file  . Frequency of Social Gatherings with Friends and Family: Not on file  . Attends Religious Services: Not on file  . Active Member of Clubs or Organizations: Not on file  . Attends Archivist Meetings: Not on file  . Marital Status: Not on file    Tobacco Counseling Counseling given: Not Answered   Clinical Intake:  Pre-visit preparation completed: Yes  Pain :  (unable to participate)     BMI - recorded: 23.1 Nutritional Status: BMI of 19-24  Normal Diabetes: No  How often do you need to have someone help you when you read instructions,  pamphlets, or other written materials from your doctor or pharmacy?: 5 - Always  Diabetic?yes   Interpreter Needed?: No      Activities of Daily Living In your present state of health, do you have any difficulty performing the following activities: 11/02/2020  Hearing? N  Vision? N  Difficulty concentrating or making decisions? Y  Walking or climbing stairs? Y  Dressing or bathing? Y  Doing errands, shopping? Y  Preparing Food and eating ? Y  Using the Toilet? Y  In the past six months, have you accidently leaked urine? (No Data)  Comment has foley  Do you have problems with loss of bowel control? Y  Managing your Medications? Y  Managing your Finances? Y  Housekeeping or managing your Housekeeping? Y  Some recent data might be hidden    Patient Care Team: [name], NP as PCP - General (Geriatric Medicine) [name], MD as Consulting Physician (Urology)  Indicate any recent Medical Services you may have received from other than [facility] providers in the past year (date may be approximate).     Assessment:   This is a routine wellness examination for [place].  Hearing/Vision screen No exam data present  Dietary issues and exercise activities discussed: Current Exercise Habits: The patient does not participate in regular exercise at present, Exercise limited by: neurologic condition(s)  Goals    . Follow up with Provider as scheduled    . General - Client will not be readmitted within 30 days (C-SNP)      Depression Screen PHQ 2/9 Scores 11/02/2020 10/30/2019  Exception Documentation Other- indicate reason in comment box Other- indicate reason in comment box  Not completed expressive aphasia unable to participate    Fall Risk Fall Risk  11/02/2020 10/30/2019 10/30/2019  Falls in the past year? 1 1 -  Number falls in past yr: 1 1 -  Injury with Fall? 0 0 0  Risk for fall due to : History of fall(s);Impaired balance/gait;Impaired mobility History of  fall(s);Impaired mobility History of fall(s)  Follow up Falls evaluation completed - -    Any stairs in or around the home?  no If so, are there any without handrails? N/a Home free of loose throw rugs in walkways, pet beds, electrical cords, etc? yes Adequate lighting in your home to reduce risk of falls? Yes   ASSISTIVE DEVICES UTILIZED TO PREVENT FALLS:  Life alert?n/a  Use of a cane, walker or w/c? Wheelchair  Grab bars in the bathroom? Yes  Shower chair or bench in shower? Yes  Elevated toilet seat or a handicapped toilet? Yes   TIMED UP AND GO:  Was the test performed? n/a is nonambulatory   Cognitive Function: MMSE - Mini Mental State Exam 11/02/2020  Not completed: Unable to complete     6CIT Screen 11/02/2020  What Year? (No Data)    Immunizations Immunization History  Administered Date(s) Administered  . Influenza-Unspecified 09/16/2019, 09/18/2020  . Moderna SARS-COVID-2 Vaccination 12/18/2019, 01/15/2020, 10/15/2020  . Pneumococcal Conjugate-13 07/31/2014  . Pneumococcal-Unspecified 09/03/2019  . Td 09/03/2019  . Tdap 07/31/2014  . Zoster 12/02/2009     Qualifies for Shingles Vaccine?yes  Screening Tests Health Maintenance  Topic Date Due  . HEMOGLOBIN A1C  01/23/2021  . URINE MICROALBUMIN  05/07/2021  . OPHTHALMOLOGY EXAM  07/15/2021  . FOOT EXAM  08/07/2021  . TETANUS/TDAP  09/02/2029  . INFLUENZA VACCINE  Completed  . COVID-19 Vaccine  Completed  . Hepatitis C Screening  Completed  . PNA vac Low Risk Adult  Completed    Health Maintenance  There are no preventive care reminders to display for this patient.    Lung Cancer Screening: (Low Dose CT Chest recommended if Age 54-80 years, 30 pack-year currently smoking OR have quit w/in 15years.) does not qualify.   Lung Cancer Screening Referral: n/a  Additional Screening:  Hepatitis C Screening: does  qualify; Completed   Vision Screening: Recommended annual ophthalmology exams for  early detection of glaucoma and other disorders of the eye. Is the patient up to date with their annual eye exam?  yes  Who is the provider or what is the name of the office in which the patient attends annual eye exams? At facility  If pt is not established with a provider, would they like to be referred to a provider to establish care?   Dental Screening: Recommended annual dental exams for proper oral hygiene  Community Resource Referral / Chronic Care Management: CRR required this visit? no  CCM required this visit? No    Plan:     I have personally reviewed and noted the following in the patient's chart:   . Medical and social history . Use of alcohol, tobacco or illicit drugs  . Current medications and supplements . Functional ability and status . Nutritional status . Physical activity . Advanced directives . List of other physicians . Hospitalizations, surgeries, and ER visits in previous 12 months . Vitals . Screenings to include cognitive, depression, and falls . Referrals and appointments  In addition, I have reviewed and discussed with patient certain preventive protocols, quality metrics, and best practice recommendations. A written personalized care plan for preventive services as well as general preventive health recommendations were provided to patient.     [name], NP   11/02/2020

## 2020-11-02 NOTE — Patient Instructions (Signed)
  [name] , Thank you for taking time to come for your Medicare Wellness Visit. I appreciate your ongoing commitment to your health goals. Please review the following plan we discussed and let me know if I can assist you in the future.   These are the goals we discussed: Goals    . Follow up with Provider as scheduled    . General - Client will not be readmitted within 30 days (C-SNP)       This is a list of the screening recommended for you and due dates:  Health Maintenance  Topic Date Due  . Hemoglobin A1C  01/23/2021  . Urine Protein Check  05/07/2021  . Eye exam for diabetics  07/15/2021  . Complete foot exam   08/07/2021  . Tetanus Vaccine  09/02/2029  . Flu Shot  Completed  . COVID-19 Vaccine  Completed  .  Hepatitis C: One time screening is recommended by Center for Disease Control  (CDC) for  adults born from [birth year] through 1967.   Completed  . Pneumonia vaccines  Completed

## 2020-11-10 DIAGNOSIS — Z1159 Encounter for screening for other viral diseases: Secondary | ICD-10-CM | POA: Diagnosis not present

## 2020-11-10 DIAGNOSIS — I610 Nontraumatic intracerebral hemorrhage in hemisphere, subcortical: Secondary | ICD-10-CM | POA: Diagnosis not present

## 2020-11-16 ENCOUNTER — Encounter: Payer: Self-pay | Admitting: Adult Health

## 2020-11-16 ENCOUNTER — Non-Acute Institutional Stay (SKILLED_NURSING_FACILITY): Payer: Medicare PPO | Admitting: Adult Health

## 2020-11-16 DIAGNOSIS — T148XXA Other injury of unspecified body region, initial encounter: Secondary | ICD-10-CM | POA: Diagnosis not present

## 2020-11-16 DIAGNOSIS — R4701 Aphasia: Secondary | ICD-10-CM

## 2020-11-16 NOTE — Progress Notes (Unsigned)
Location:    [facility] Room Number: 130/P Place of Service:  SNF (31)   CODE STATUS: Full Code  No Known Allergies  Chief Complaint  Patient presents with  . Acute Visit    Status    HPI:  His wife over this past weekend has noted bruising to both hands. There is no swelling present. There are no indications of pain present. He has 3 small random bruises present near his thumbs. He is unable to accurately verbalize events. He will misspeak words.  His wife is concerned about the bruising present.   Past Medical History:  Diagnosis Date  . A-fib ([place])    a. Post-op afib after CABG 08/2012.  [name] Acute gastric ulcer with hemorrhage   . Acute respiratory failure with hypoxia ([place])   . Aphasia following cerebral infarction   . Atrial fibrillation ([place])   . CAD (coronary artery disease)    a. NSTEMI s/p stent to distal RCA 03/2000. b. Inf MI s/p emergent thrombectomy/stenting mid RCA 10/2000. c. NSTEMI s/p CABGx4 (LIMA-LAD, SVG-OM2, seq SVG-acute marginal and PD) 08/18/27 - course complicated by confusion, post-op AF, L pleural effusion with thoracentesis. d. Normal LV function by echo 08/2012.  . Diabetes mellitus   . Diverticulosis   . Dysphagia following cerebral infarction   . Encephalopathy   . Extended spectrum beta lactamase (ESBL) resistance   . Generalized anxiety disorder   . GERD (gastroesophageal reflux disease)   . Hemiplegia and hemiparesis following cerebral infarction affecting right dominant side ([place])   . Hiatal hernia   . HLD (hyperlipidemia)   . HTN (hypertension)   . Non-ST elevation (NSTEMI) myocardial infarction ([place])   . Nontraumatic intracerebral hemorrhage in hemisphere, subcortical ([place])   . Peripheral vascular disease ([place])    a. Carotid dopplers neg 08/13/12. b. Pre-cabg ABIs - R=0.87 suggesting mild dz, L=1.29 possibly falsely elevated due to calcified vessels.  . Pleural effusion    a. L pleural eff after CABG s/p thoracentesis  08/22/12.  . Prostate cancer [facility])    Status post radiation treatment.  . Prostatic hypertrophy    a. Hx of urinary retention, awaiting TURP.  [name] Severe protein-calorie malnutrition ([place])   . Tobacco abuse   . 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 Valvular heart disease    a. Mild  MR by TEE 08/2012.    Past Surgical History:  Procedure Laterality Date  . APPENDECTOMY    . BACK SURGERY    . CORONARY ARTERY BYPASS GRAFT  08/16/2012   Procedure: CORONARY ARTERY BYPASS GRAFTING (CABG);  Surgeon: Melrose Nakayama, MD;  Location: [facility];  Service: Open Heart Surgery;  Laterality: N/A;  . IR REPLC GASTRO/COLONIC TUBE PERCUT W/FLUORO  02/13/2019  . LEFT HEART CATHETERIZATION WITH CORONARY ANGIOGRAM N/A 08/14/2012   Procedure: LEFT HEART CATHETERIZATION WITH CORONARY ANGIOGRAM;  Surgeon: Peter M Martinique, MD;  Location: [facility] CATH LAB;  Service: Cardiovascular;  Laterality: N/A;    Social History   Socioeconomic History  . Marital status: Married    Spouse name: Not on file  . Number of children: Not on file  . Years of education: Not on file  . Highest education level: Not on file  Occupational History  . Occupation: retired   Tobacco Use  . Smoking status: Former Smoker    Quit date: 11/12/1972    Years since quitting: 48.0  . Smokeless tobacco: Never Used  Vaping Use  . Vaping Use:  Never used  Substance and Sexual Activity  . Alcohol use: No  . Drug use: No  . Sexual activity: Not Currently  Other Topics Concern  . Not on file  Social History Narrative   He is a long term patient of [facility]    Social Determinants of Health   Financial Resource Strain:   . Difficulty of Paying Living Expenses: Not on file  Food Insecurity:   . Worried About Charity fundraiser in the Last Year: Not on file  . Ran Out of Food in the Last Year: Not on file  Transportation Needs:   . Lack of Transportation (Medical): Not on file  . Lack of Transportation (Non-Medical): Not on  file  Physical Activity:   . Days of Exercise per Week: Not on file  . Minutes of Exercise per Session: Not on file  Stress:   . Feeling of Stress : Not on file  Social Connections:   . Frequency of Communication with Friends and Family: Not on file  . Frequency of Social Gatherings with Friends and Family: Not on file  . Attends Religious Services: Not on file  . Active Member of Clubs or Organizations: Not on file  . Attends Archivist Meetings: Not on file  . Marital Status: Not on file  Intimate Partner Violence:   . Fear of Current or Ex-Partner: Not on file  . Emotionally Abused: Not on file  . Physically Abused: Not on file  . Sexually Abused: Not on file   Family History  Problem Relation Age of Onset  . Hypertension Mother       VITAL SIGNS BP 128/76   Pulse 63   Temp 98.1 F (36.7 C)   Resp 20   Ht 5' 11"  (1.803 m)   Wt 161 lb (73 kg)   BMI 22.45 kg/m   Outpatient Encounter Medications as of 11/16/2020  Medication Sig  . acetaminophen (TYLENOL) 325 MG tablet Take 650 mg by mouth every 4 (four) hours as needed. per standing order for pain/fever DO NOT EXCEED >3,000 mg in 24 hours  . amLODipine (NORVASC) 5 MG tablet Take 5 mg by mouth daily.   [name] atorvastatin (LIPITOR) 40 MG tablet Take 40 mg by mouth at bedtime.   . baclofen (LIORESAL) 10 MG tablet Take 1 tablet by mouth 2 (two) times daily. Hold for sedation  . busPIRone (BUSPAR) 5 MG tablet Take 5 mg by mouth 2 (two) times daily. For anxiety  . Eyelid Cleansers (OCUSOFT LID SCRUB EX) Apply 1 each topically daily. Both eyes  . FLUoxetine (PROZAC) 20 MG/5ML solution Take 20 mg by mouth daily.   . Glucerna (GLUCERNA) LIQD Take 237 mLs by mouth daily.  . hydrocortisone cream (PREPARATION H) 1 % Apply rectally twice daily and as needed for hemorrhoids  . Insulin Glargine (LANTUS SOLOSTAR) 100 UNIT/ML Solostar Pen Inject 20 Units into the skin daily.  . insulin lispro (HUMALOG) 100 UNIT/ML cartridge  Inject 10 Units into the skin 3 (three) times daily before meals. Give 10 units SQ with meals if Blood Sugar >150  . lansoprazole (PREVACID SOLUTAB) 30 MG disintegrating tablet Take 30 mg by mouth daily. Give before meals  . Melatonin 1 MG TABS Take 2 tablets by mouth at bedtime.   . NON FORMULARY Diet Change: Regular, thin liquids  . polyethylene glycol (MIRALAX / GLYCOLAX) 17 g packet Take 17 g by mouth daily as needed.  . valsartan (DIOVAN) 80 MG tablet Take 80  mg by mouth daily.   No facility-administered encounter medications on file as of 11/16/2020.     SIGNIFICANT DIAGNOSTIC EXAMS   LABS REVIEWED PREVIOUS:    01-15-20: wbc 7.7; hgb 16.5; hct 51.4; mcv 93.1 plt 188; glucose 99; bun 32; creat 1.49; k+ 4.2; na++ 142; ca 8.9; liver normal albumin 3.1; hgb a1c 6.5  05-07-20: urine for micro-albumin: 413.2 ( on ARB)  05-18-20: wbc 9.2; hgb 14.8; hct 45.8; mcv 92.5 plt 270; glucose 128; bun 32; creat 1.60 ;k+ 4.3; na++ 140; ca 8.6 alk phos 142 albumin 2.5 05-19-20: glucose 109; bun 34; creat 1.79; k+ 4.0; na++ 140; ca 8.2 alk phos 145; albumin 2.3  05-22-20: glucose 84; bun 26; creat 1.58; k+ 4.7; na++ 140; ca 8.5 05-25-20: glucose 96; bun 23; creat 1.42; k+ 4.0; na++ 139; ca 8.1  07-23-20: hgb a1c 6.2; chol 79; ldl 48; trig 53; hdl 20  10-29-20: glucose 103; bun 39; creat 1.64; k+ 4.0; na++ 140; ca 8.5    NO NEW LABS.   Review of Systems  Reason unable to perform ROS: expressive aphasia      Physical Exam Constitutional:      General: He is not in acute distress.    Appearance: He is well-developed. He is not diaphoretic.  Neck:     Thyroid: No thyromegaly.  Cardiovascular:     Rate and Rhythm: Normal rate and regular rhythm.     Pulses: Normal pulses.     Heart sounds: Normal heart sounds.     Comments: Hx CABG  Pulmonary:     Effort: Pulmonary effort is normal. No respiratory distress.     Breath sounds: Normal breath sounds.  Abdominal:     General: Bowel sounds are normal.  There is no distension.     Palpations: Abdomen is soft.     Tenderness: There is no abdominal tenderness.  Genitourinary:    Comments: Foley; penile tear  Musculoskeletal:     Cervical back: Neck supple.     Right lower leg: No edema.     Left lower leg: No edema.     Comments: Right hemiplegia   Lymphadenopathy:     Cervical: No cervical adenopathy.  Skin:    General: Skin is warm and dry.     Comments: Has 3 small bruises on both hands without indications of trauma present.   Neurological:     Mental Status: He is alert. Mental status is at baseline.  Psychiatric:        Mood and Affect: Mood normal.       ASSESSMENT/ PLAN:  TODAY  1. Expressive aphasia 2. Bruise to bilateral hands  These bruises are random in nature and small without signs of trauma present.  Will continue to monitor his status.   MD is aware of resident's narcotic use and is in agreement with current plan of care. We will attempt to wean resident as appropriate.  Ok Edwards NP [facility]  Contact [phone] Monday through Friday 8am- 5pm  After hours call [phone]

## 2020-11-18 DIAGNOSIS — I69320 Aphasia following cerebral infarction: Secondary | ICD-10-CM | POA: Insufficient documentation

## 2020-11-18 DIAGNOSIS — T148XXA Other injury of unspecified body region, initial encounter: Secondary | ICD-10-CM | POA: Insufficient documentation

## 2020-11-18 DIAGNOSIS — R4701 Aphasia: Secondary | ICD-10-CM | POA: Insufficient documentation

## 2020-11-20 DIAGNOSIS — F5105 Insomnia due to other mental disorder: Secondary | ICD-10-CM | POA: Diagnosis not present

## 2020-11-20 DIAGNOSIS — F333 Major depressive disorder, recurrent, severe with psychotic symptoms: Secondary | ICD-10-CM | POA: Diagnosis not present

## 2020-11-24 ENCOUNTER — Non-Acute Institutional Stay: Payer: Self-pay | Admitting: Adult Health

## 2020-11-24 ENCOUNTER — Encounter: Payer: Self-pay | Admitting: Adult Health

## 2020-11-24 DIAGNOSIS — F323 Major depressive disorder, single episode, severe with psychotic features: Secondary | ICD-10-CM

## 2020-11-24 NOTE — Progress Notes (Signed)
Location:  [place] Room Number: 195-K Place of Service:  SNF (31)   CODE STATUS: Full Code  No Known Allergies  Chief Complaint  Patient presents with   Acute Visit    Medication review     HPI:  He is presently taking buspar 5 mg twice daily in Sept of this year; is also taking prozac 20 mg daily. There are no reports of anxiety; no reports of aggressive behaviors. He is due for a GDR; will need to stop buspar.   Past Medical History:  Diagnosis Date   A-fib [facility])    a. Post-op afib after CABG 08/2012.   Acute gastric ulcer with hemorrhage    Acute respiratory failure with hypoxia (HCC)    Aphasia following cerebral infarction    Atrial fibrillation (HCC)    CAD (coronary artery disease)    a. NSTEMI s/p stent to distal RCA 03/2000. b. Inf MI s/p emergent thrombectomy/stenting mid RCA 10/2000. c. NSTEMI s/p CABGx4 (LIMA-LAD, SVG-OM2, seq SVG-acute marginal and PD) 08/14/25 - course complicated by confusion, post-op AF, L pleural effusion with thoracentesis. d. Normal LV function by echo 08/2012.   Diabetes mellitus    Diverticulosis    Dysphagia following cerebral infarction    Encephalopathy    Extended spectrum beta lactamase (ESBL) resistance    Generalized anxiety disorder    GERD (gastroesophageal reflux disease)    Hemiplegia and hemiparesis following cerebral infarction affecting right dominant side (HCC)    Hiatal hernia    HLD (hyperlipidemia)    HTN (hypertension)    Non-ST elevation (NSTEMI) myocardial infarction [facility])    Nontraumatic intracerebral hemorrhage in hemisphere, subcortical [facility])    Peripheral vascular disease (HCC)    a. Carotid dopplers neg 08/13/12. b. Pre-cabg ABIs - R=0.87 suggesting mild dz, L=1.29 possibly falsely elevated due to calcified vessels.   Pleural effusion    a. L pleural eff after CABG s/p thoracentesis 08/22/12.   Prostate cancer [facility])    Status post radiation treatment.   Prostatic  hypertrophy    a. Hx of urinary retention, awaiting TURP.   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 Mild  MR by TEE 08/2012.    Past Surgical History:  Procedure Laterality Date   APPENDECTOMY     BACK SURGERY     CORONARY ARTERY BYPASS GRAFT  08/16/2012   Procedure: CORONARY ARTERY BYPASS GRAFTING (CABG);  Surgeon: Melrose Nakayama, MD;  Location: [place];  Service: Open Heart Surgery;  Laterality: N/A;   IR REPLC GASTRO/COLONIC TUBE PERCUT W/FLUORO  02/13/2019   LEFT HEART CATHETERIZATION WITH CORONARY ANGIOGRAM N/A 08/14/2012   Procedure: LEFT HEART CATHETERIZATION WITH CORONARY ANGIOGRAM;  Surgeon: Peter M Martinique, MD;  Location: [facility] CATH LAB;  Service: Cardiovascular;  Laterality: N/A;    Social History   Socioeconomic History   Marital status: Married    Spouse name: Not on file   Number of children: Not on file   Years of education: Not on file   Highest education level: Not on file  Occupational History   Occupation: retired   Tobacco Use   Smoking status: Former Smoker    Quit date: 11/12/1972    Years since quitting: 48.0   Smokeless tobacco: Never Used  Vaping Use   Vaping Use: Never used  Substance and Sexual Activity   Alcohol use:  No   Drug use: No   Sexual activity: Not Currently  Other Topics Concern   Not on file  Social History Narrative   He is a long term patient of [place]    Social Determinants of Health   Financial Resource Strain: Not on file  Food Insecurity: Not on file  Transportation Needs: Not on file  Physical Activity: Not on file  Stress: Not on file  Social Connections: Not on file  Intimate Partner Violence: Not on file   Family History  Problem Relation Age of Onset   Hypertension Mother       VITAL SIGNS BP (!) 142/68    Pulse (!) 56    Temp (!) 97.3 F (36.3 C)    Resp 20    Ht 5' 11"  (1.803 m)     Wt 161 lb (73 kg)    SpO2 100%    BMI 22.45 kg/m   Outpatient Encounter Medications as of 11/24/2020  Medication Sig   acetaminophen (TYLENOL) 325 MG tablet Take 650 mg by mouth every 4 (four) hours as needed. per standing order for pain/fever DO NOT EXCEED >3,000 mg in 24 hours   amLODipine (NORVASC) 5 MG tablet Take 5 mg by mouth daily.    atorvastatin (LIPITOR) 40 MG tablet Take 40 mg by mouth at bedtime.    baclofen (LIORESAL) 10 MG tablet Take 1 tablet by mouth 2 (two) times daily. Hold for sedation   busPIRone (BUSPAR) 5 MG tablet Take 5 mg by mouth 2 (two) times daily. For anxiety   Eyelid Cleansers (OCUSOFT LID SCRUB EX) Apply 1 each topically daily. Both eyes   FLUoxetine (PROZAC) 20 MG/5ML solution Take 20 mg by mouth daily.    Glucerna (GLUCERNA) LIQD Take 237 mLs by mouth daily.   hydrocortisone cream (PREPARATION H) 1 % Apply rectally twice daily and as needed for hemorrhoids   Insulin Glargine (LANTUS SOLOSTAR) 100 UNIT/ML Solostar Pen Inject 20 Units into the skin daily.   insulin lispro (HUMALOG) 100 UNIT/ML cartridge Inject 10 Units into the skin 3 (three) times daily before meals. Give 10 units SQ with meals if Blood Sugar >150   lansoprazole (PREVACID SOLUTAB) 30 MG disintegrating tablet Take 30 mg by mouth daily. Give before meals   Melatonin 1 MG TABS Take 2 tablets by mouth at bedtime.    NON FORMULARY Diet Change: Regular, thin liquids   polyethylene glycol (MIRALAX / GLYCOLAX) 17 g packet Take 17 g by mouth daily as needed.   valsartan (DIOVAN) 80 MG tablet Take 80 mg by mouth daily.   No facility-administered encounter medications on file as of 11/24/2020.     SIGNIFICANT DIAGNOSTIC EXAMS  LABS REVIEWED PREVIOUS:    01-15-20: wbc 7.7; hgb 16.5; hct 51.4; mcv 93.1 plt 188; glucose 99; bun 32; creat 1.49; k+ 4.2; na++ 142; ca 8.9; liver normal albumin 3.1; hgb a1c 6.5  05-07-20: urine for micro-albumin: 413.2 ( on ARB)  05-18-20: wbc 9.2; hgb 14.8;  hct 45.8; mcv 92.5 plt 270; glucose 128; bun 32; creat 1.60 ;k+ 4.3; na++ 140; ca 8.6 alk phos 142 albumin 2.5 05-19-20: glucose 109; bun 34; creat 1.79; k+ 4.0; na++ 140; ca 8.2 alk phos 145; albumin 2.3  05-22-20: glucose 84; bun 26; creat 1.58; k+ 4.7; na++ 140; ca 8.5 05-25-20: glucose 96; bun 23; creat 1.42; k+ 4.0; na++ 139; ca 8.1  07-23-20: hgb a1c 6.2; chol 79; ldl 48; trig 53; hdl 20  10-29-20: glucose 103; bun  39; creat 1.64; k+ 4.0; na++ 140; ca 8.5    NO NEW LABS.   Review of Systems  Reason unable to perform ROS: expressive aphasia    Physical Exam Constitutional:      General: He is not in acute distress.    Appearance: He is well-developed and well-nourished. He is not diaphoretic.  Neck:     Thyroid: No thyromegaly.  Cardiovascular:     Rate and Rhythm: Normal rate and regular rhythm.     Pulses: Normal pulses and intact distal pulses.     Heart sounds: Normal heart sounds.     Comments: Hx CABG  Pulmonary:     Effort: Pulmonary effort is normal. No respiratory distress.     Breath sounds: Normal breath sounds.  Abdominal:     General: Bowel sounds are normal. There is no distension.     Palpations: Abdomen is soft.     Tenderness: There is no abdominal tenderness.  Genitourinary:    Comments:  Foley; penile tear  Musculoskeletal:        General: No edema.     Cervical back: Neck supple.     Right lower leg: No edema.     Left lower leg: No edema.     Comments: Right hemiplegia   Lymphadenopathy:     Cervical: No cervical adenopathy.  Skin:    General: Skin is warm and dry.  Neurological:     Mental Status: He is alert. Mental status is at baseline.  Psychiatric:        Mood and Affect: Mood and affect and mood normal.       ASSESSMENT/ PLAN:  TODAY  1. Major depression with psychotic features:  He is emotionally stable Will continue paxil 30 mg daily  Will lopwer buspar to 5 mg daily for one week then stop Will continue to monitor his responsive    MD is aware of resident's narcotic use and is in agreement with current plan of care. We will attempt to wean resident as appropriate.  Ok Edwards NP [facility]  Contact [phone] Monday through Friday 8am- 5pm  After hours call [phone]

## 2020-11-30 ENCOUNTER — Encounter: Payer: Self-pay | Admitting: Adult Health

## 2020-11-30 ENCOUNTER — Non-Acute Institutional Stay (SKILLED_NURSING_FACILITY): Payer: Medicare PPO | Admitting: Adult Health

## 2020-11-30 DIAGNOSIS — G8111 Spastic hemiplegia affecting right dominant side: Secondary | ICD-10-CM

## 2020-11-30 DIAGNOSIS — I610 Nontraumatic intracerebral hemorrhage in hemisphere, subcortical: Secondary | ICD-10-CM | POA: Diagnosis not present

## 2020-11-30 DIAGNOSIS — E43 Unspecified severe protein-calorie malnutrition: Secondary | ICD-10-CM | POA: Diagnosis not present

## 2020-11-30 DIAGNOSIS — E1151 Type 2 diabetes mellitus with diabetic peripheral angiopathy without gangrene: Secondary | ICD-10-CM | POA: Diagnosis not present

## 2020-11-30 NOTE — Progress Notes (Signed)
Location:  [place] Room Number: 130/P Place of Service:  SNF (31)   CODE STATUS: Full Code  No Known Allergies  Chief Complaint  Patient presents with   Medical Management of Chronic Issues          Type 2 diabetes mellitus with peripheral vascular disease  Protein calorie malnutrition severe;   Nontraumatic subcortical hemorrhage of left cerebra hemisphere/hemorrhagic cerebrovascular accident right hemiparesis      HPI:  He is a 74 year old long term resident of this facility being seen for the management of his chronic illnesses: Type 2 diabetes mellitus with peripheral vascular disease  Protein calorie malnutrition severe;   Nontraumatic subcortical hemorrhage of left cerebra hemisphere/hemorrhagic cerebrovascular accident right hemiparesis. There are no reports of uncontrolled pain; no reports of anxiety is off buspar. There are no reports of choking present.   Past Medical History:  Diagnosis Date   A-fib [facility])    a. Post-op afib after CABG 08/2012.   Acute gastric ulcer with hemorrhage    Acute respiratory failure with hypoxia (HCC)    Aphasia following cerebral infarction    Atrial fibrillation (HCC)    CAD (coronary artery disease)    a. NSTEMI s/p stent to distal RCA 03/2000. b. Inf MI s/p emergent thrombectomy/stenting mid RCA 10/2000. c. NSTEMI s/p CABGx4 (LIMA-LAD, SVG-OM2, seq SVG-acute marginal and PD) 6/0/63 - course complicated by confusion, post-op AF, L pleural effusion with thoracentesis. d. Normal LV function by echo 08/2012.   Diabetes mellitus    Diverticulosis    Dysphagia following cerebral infarction    Encephalopathy    Extended spectrum beta lactamase (ESBL) resistance    Generalized anxiety disorder    GERD (gastroesophageal reflux disease)    Hemiplegia and hemiparesis following cerebral infarction affecting right dominant side (HCC)    Hiatal hernia    HLD (hyperlipidemia)    HTN (hypertension)    Non-ST  elevation (NSTEMI) myocardial infarction [facility])    Nontraumatic intracerebral hemorrhage in hemisphere, subcortical [facility])    Peripheral vascular disease (HCC)    a. Carotid dopplers neg 08/13/12. b. Pre-cabg ABIs - R=0.87 suggesting mild dz, L=1.29 possibly falsely elevated due to calcified vessels.   Pleural effusion    a. L pleural eff after CABG s/p thoracentesis 08/22/12.   Prostate cancer [facility])    Status post radiation treatment.   Prostatic hypertrophy    a. Hx of urinary retention, awaiting TURP.   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 Mild  MR by TEE 08/2012.    Past Surgical History:  Procedure Laterality Date   APPENDECTOMY     BACK SURGERY     CORONARY ARTERY BYPASS GRAFT  08/16/2012   Procedure: CORONARY ARTERY BYPASS GRAFTING (CABG);  Surgeon: Melrose Nakayama, MD;  Location: [place];  Service: Open Heart Surgery;  Laterality: N/A;   IR REPLC GASTRO/COLONIC TUBE PERCUT W/FLUORO  02/13/2019   LEFT HEART CATHETERIZATION WITH CORONARY ANGIOGRAM N/A 08/14/2012   Procedure: LEFT HEART CATHETERIZATION WITH CORONARY ANGIOGRAM;  Surgeon: Peter M Martinique, MD;  Location: [facility] CATH LAB;  Service: Cardiovascular;  Laterality: N/A;    Social History   Socioeconomic History   Marital status: Married    Spouse name: Not on file   Number of children: Not on file   Years of education: Not on file   Highest  education level: Not on file  Occupational History   Occupation: retired   Tobacco Use   Smoking status: Former Smoker    Quit date: 11/12/1972    Years since quitting: 48.0   Smokeless tobacco: Never Used  Vaping Use   Vaping Use: Never used  Substance and Sexual Activity   Alcohol use: No   Drug use: No   Sexual activity: Not Currently  Other Topics Concern   Not on file  Social History Narrative   He is a long term patient of [place]    Social  Determinants of Health   Financial Resource Strain: Not on file  Food Insecurity: Not on file  Transportation Needs: Not on file  Physical Activity: Not on file  Stress: Not on file  Social Connections: Not on file  Intimate Partner Violence: Not on file   Family History  Problem Relation Age of Onset   Hypertension Mother       VITAL SIGNS BP 116/64    Pulse 74    Temp 98 F (36.7 C)    Resp 20    Ht _0  (1.803 m)    Wt 161 lb (73 kg)    BMI 22.45 kg/m   Outpatient Encounter Medications as of 11/30/2020  Medication Sig   acetaminophen (TYLENOL) 325 MG tablet Take 650 mg by mouth every 4 (four) hours as needed. per standing order for pain/fever DO NOT EXCEED >3,000 mg in 24 hours   amLODipine (NORVASC) 5 MG tablet Take 5 mg by mouth daily.    atorvastatin (LIPITOR) 40 MG tablet Take 40 mg by mouth at bedtime.    baclofen (LIORESAL) 10 MG tablet Take 1 tablet by mouth 2 (two) times daily. Hold for sedation   busPIRone (BUSPAR) 5 MG tablet Take 5 mg by mouth 2 (two) times daily. For anxiety   Eyelid Cleansers (OCUSOFT LID SCRUB EX) Apply 1 each topically daily. Both eyes   FLUoxetine (PROZAC) 20 MG/5ML solution Take 20 mg by mouth daily.    Glucerna (GLUCERNA) LIQD Take 237 mLs by mouth daily.   hydrocortisone cream 1 % Apply rectally twice daily and as needed for hemorrhoids   Insulin Glargine (LANTUS SOLOSTAR) 100 UNIT/ML Solostar Pen Inject 20 Units into the skin daily.   insulin lispro (HUMALOG) 100 UNIT/ML cartridge Inject 10 Units into the skin 3 (three) times daily before meals. Give 10 units SQ with meals if Blood Sugar >150   lansoprazole (PREVACID SOLUTAB) 30 MG disintegrating tablet Take 30 mg by mouth daily. Give before meals   Melatonin 1 MG TABS Take 2 tablets by mouth at bedtime.    NON FORMULARY Diet Change: Regular, thin liquids   polyethylene glycol (MIRALAX / GLYCOLAX) 17 g packet Take 17 g by mouth daily as needed.   valsartan (DIOVAN) 80  MG tablet Take 80 mg by mouth daily.   No facility-administered encounter medications on file as of 11/30/2020.     SIGNIFICANT DIAGNOSTIC EXAMS  LABS REVIEWED PREVIOUS:    01-15-20: wbc 7.7; hgb 16.5; hct 51.4; mcv 93.1 plt 188; glucose 99; bun 32; creat 1.49; k+ 4.2; na++ 142; ca 8.9; liver normal albumin 3.1; hgb a1c 6.5  05-07-20: urine for micro-albumin: 413.2 ( on ARB)  05-18-20: wbc 9.2; hgb 14.8; hct 45.8; mcv 92.5 plt 270; glucose 128; bun 32; creat 1.60 ;k+ 4.3; na++ 140; ca 8.6 alk phos 142 albumin 2.5 05-19-20: glucose 109; bun 34; creat 1.79; k+ 4.0; na++ 140; ca 8.2 alk  phos 145; albumin 2.3  05-22-20: glucose 84; bun 26; creat 1.58; k+ 4.7; na++ 140; ca 8.5 05-25-20: glucose 96; bun 23; creat 1.42; k+ 4.0; na++ 139; ca 8.1  07-23-20: hgb a1c 6.2; chol 79; ldl 48; trig 53; hdl 20  10-29-20: glucose 103; bun 39; creat 1.64; k+ 4.0; na++ 140; ca 8.5   NO NEW LABS.   Review of Systems  Reason unable to perform ROS: expressive aphasia      Physical Exam Constitutional:      General: He is not in acute distress.    Appearance: He is well-developed and well-nourished. He is not diaphoretic.  Neck:     Thyroid: No thyromegaly.  Cardiovascular:     Rate and Rhythm: Normal rate and regular rhythm.     Pulses: Normal pulses and intact distal pulses.     Heart sounds: Normal heart sounds.     Comments: Hx cabg  Pulmonary:     Effort: Pulmonary effort is normal. No respiratory distress.     Breath sounds: Normal breath sounds.  Abdominal:     General: Bowel sounds are normal. There is no distension.     Palpations: Abdomen is soft.     Tenderness: There is no abdominal tenderness.  Genitourinary:    Comments: Foley; penile tear  Musculoskeletal:        General: No edema.     Cervical back: Neck supple.     Right lower leg: No edema.     Left lower leg: No edema.     Comments: Right hemiplegia   Lymphadenopathy:     Cervical: No cervical adenopathy.  Skin:    General:  Skin is warm and dry.  Neurological:     Mental Status: He is alert. Mental status is at baseline.  Psychiatric:        Mood and Affect: Mood and affect and mood normal.      ASSESSMENT/ PLAN:  TODAY:   1. Type 2 diabetes mellitus with peripheral vascular disease is stable hgb a1c 6.2 will continue lantus 20 units nightly and novolog with 10 units with meals.  2. Protein calorie malnutrition severe; weight is stable weight is 161 pounds albumin 3.1 will monitor   3. Nontraumatic subcortical hemorrhage of left cerebra hemisphere/hemorrhagic cerebrovascular accident right hemiparesis is stable will continue baclofen 10 mg twice daily for spasticity.     PREVIOUS   4. CAD native artery native heart without angina: is status post CABG 2012; is stable will continue diovan 80 mg daily   5. Hypertension associated with diabetes mellitus is stable b/p 116/64; will continue norvasc 5 mg daily and  diovan 80 mg daily   6. Dyslipidemia associated with type 2 diabetes mellitus is stable LDL 48 will continue lipitor 40 mg daily   7. Micro-albuminuria due to type 2 diabetes mellitus is without change is 413.2 is on ARB   8. Major depression with psychotic features: is stable will continue prozac 20 mg daily is off buspar; is off antipsychotics  9. CKD stage 3 due to type 2 diabetes mellitus: is stable bun 39; creat 1.64 will monitor  10. Chronic urine retention is stable has long term foley will monitor    MD is aware of resident's narcotic use and is in agreement with current plan of care. We will attempt to wean resident as appropriate.  Ok Edwards NP [facility]  Contact [phone] Monday through Friday 8am- 5pm  After hours call [phone] .

## 2020-12-09 DIAGNOSIS — I610 Nontraumatic intracerebral hemorrhage in hemisphere, subcortical: Secondary | ICD-10-CM | POA: Diagnosis not present

## 2020-12-09 DIAGNOSIS — Z1159 Encounter for screening for other viral diseases: Secondary | ICD-10-CM | POA: Diagnosis not present

## 2020-12-10 ENCOUNTER — Encounter: Payer: Self-pay | Admitting: Adult Health

## 2020-12-10 ENCOUNTER — Non-Acute Institutional Stay (SKILLED_NURSING_FACILITY): Payer: Medicare PPO | Admitting: Adult Health

## 2020-12-10 DIAGNOSIS — E1159 Type 2 diabetes mellitus with other circulatory complications: Secondary | ICD-10-CM | POA: Diagnosis not present

## 2020-12-10 DIAGNOSIS — I700 Atherosclerosis of aorta: Secondary | ICD-10-CM

## 2020-12-10 DIAGNOSIS — E1151 Type 2 diabetes mellitus with diabetic peripheral angiopathy without gangrene: Secondary | ICD-10-CM

## 2020-12-10 DIAGNOSIS — I152 Hypertension secondary to endocrine disorders: Secondary | ICD-10-CM

## 2020-12-10 DIAGNOSIS — G8111 Spastic hemiplegia affecting right dominant side: Secondary | ICD-10-CM | POA: Diagnosis not present

## 2020-12-10 NOTE — Progress Notes (Signed)
Location:  [facility] Room Number: 130/P Place of Service:  SNF (31)   CODE STATUS: Full Code  No Known Allergies  Chief Complaint  Patient presents with  . Acute Visit    Care Plan Meeting     HPI:  We have come together for his care plan meeting. Family present. No BIMS; mood 0/30. He is extensive to dependent with his adls. Has foley is incontinent of bowel. He feeds self with assistance. He is getting out of bed daily. He has had one fall back in April without injury. He will talk at times. His weight is stable at 161 pounds has a good appetite. His cbg's are stable. There are no reports of pain present; no reports of agitation or insomnia. He continues to be followed for his chronic illnesses including: Aortic atherosclerosis Right spastic hemiplegia Hypertension associated with diabetes Type 2 diabetes mellitus with peripheral vascular disease  Past Medical History:  Diagnosis Date  . A-fib ([place])    a. Post-op afib after CABG 08/2012.  [name] Acute gastric ulcer with hemorrhage   . Acute respiratory failure with hypoxia ([place])   . Aphasia following cerebral infarction   . Atrial fibrillation ([place])   . CAD (coronary artery disease)    a. NSTEMI s/p stent to distal RCA 03/2000. b. Inf MI s/p emergent thrombectomy/stenting mid RCA 10/2000. c. NSTEMI s/p CABGx4 (LIMA-LAD, SVG-OM2, seq SVG-acute marginal and PD) 0/3/00 - course complicated by confusion, post-op AF, L pleural effusion with thoracentesis. d. Normal LV function by echo 08/2012.  . Diabetes mellitus   . Diverticulosis   . Dysphagia following cerebral infarction   . Encephalopathy   . Extended spectrum beta lactamase (ESBL) resistance   . Generalized anxiety disorder   . GERD (gastroesophageal reflux disease)   . Hemiplegia and hemiparesis following cerebral infarction affecting right dominant side ([place])   . Hiatal hernia   . HLD (hyperlipidemia)   . HTN (hypertension)   . Non-ST elevation (NSTEMI)  myocardial infarction ([place])   . Nontraumatic intracerebral hemorrhage in hemisphere, subcortical ([place])   . Peripheral vascular disease ([place])    a. Carotid dopplers neg 08/13/12. b. Pre-cabg ABIs - R=0.87 suggesting mild dz, L=1.29 possibly falsely elevated due to calcified vessels.  . Pleural effusion    a. L pleural eff after CABG s/p thoracentesis 08/22/12.  . Prostate cancer [facility])    Status post radiation treatment.  . Prostatic hypertrophy    a. Hx of urinary retention, awaiting TURP.  [name] Severe protein-calorie malnutrition ([place])   . Tobacco abuse   . 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 Valvular heart disease    a. Mild  MR by TEE 08/2012.    Past Surgical History:  Procedure Laterality Date  . APPENDECTOMY    . BACK SURGERY    . CORONARY ARTERY BYPASS GRAFT  08/16/2012   Procedure: CORONARY ARTERY BYPASS GRAFTING (CABG);  Surgeon: Melrose Nakayama, MD;  Location: [facility];  Service: Open Heart Surgery;  Laterality: N/A;  . IR REPLC GASTRO/COLONIC TUBE PERCUT W/FLUORO  02/13/2019  . LEFT HEART CATHETERIZATION WITH CORONARY ANGIOGRAM N/A 08/14/2012   Procedure: LEFT HEART CATHETERIZATION WITH CORONARY ANGIOGRAM;  Surgeon: Peter M Martinique, MD;  Location: [facility] CATH LAB;  Service: Cardiovascular;  Laterality: N/A;    Social History   Socioeconomic History  . Marital status: Married    Spouse name: Not on file  . Number of children: Not on file  .  Years of education: Not on file  . Highest education level: Not on file  Occupational History  . Occupation: retired   Tobacco Use  . Smoking status: Former Smoker    Quit date: 11/12/1972    Years since quitting: 48.1  . Smokeless tobacco: Never Used  Vaping Use  . Vaping Use: Never used  Substance and Sexual Activity  . Alcohol use: No  . Drug use: No  . Sexual activity: Not Currently  Other Topics Concern  . Not on file  Social History Narrative   He is a long term patient of  [facility]    Social Determinants of  Health   Financial Resource Strain: Not on file  Food Insecurity: Not on file  Transportation Needs: Not on file  Physical Activity: Not on file  Stress: Not on file  Social Connections: Not on file  Intimate Partner Violence: Not on file   Family History  Problem Relation Age of Onset  . Hypertension Mother       VITAL SIGNS BP 126/73   Pulse 60   Temp (!) 97.5 F (36.4 C)   Resp 20   Ht 5' 11"  (1.803 m)   Wt 161 lb (73 kg)   SpO2 100%   BMI 22.45 kg/m   Outpatient Encounter Medications as of 12/10/2020  Medication Sig  . acetaminophen (TYLENOL) 325 MG tablet Take 650 mg by mouth every 4 (four) hours as needed. per standing order for pain/fever DO NOT EXCEED >3,000 mg in 24 hours  . amLODipine (NORVASC) 5 MG tablet Take 5 mg by mouth daily.   [name] atorvastatin (LIPITOR) 40 MG tablet Take 40 mg by mouth at bedtime.   . baclofen (LIORESAL) 10 MG tablet Take 1 tablet by mouth 2 (two) times daily. Hold for sedation  . Eyelid Cleansers (OCUSOFT LID SCRUB EX) Apply 1 each topically daily. Both eyes  . FLUoxetine (PROZAC) 20 MG/5ML solution Take 20 mg by mouth daily.   . Glucerna (GLUCERNA) LIQD Take 237 mLs by mouth daily.  . hydrocortisone cream 1 % Apply rectally twice daily and as needed for hemorrhoids  . Insulin Glargine (LANTUS SOLOSTAR) 100 UNIT/ML Solostar Pen Inject 20 Units into the skin daily.  . insulin lispro (HUMALOG) 100 UNIT/ML cartridge Inject 10 Units into the skin 3 (three) times daily before meals. Give 10 units SQ with meals if Blood Sugar >150  . lansoprazole (PREVACID SOLUTAB) 30 MG disintegrating tablet Take 30 mg by mouth daily. Give before meals  . Melatonin 1 MG TABS Take 2 tablets by mouth at bedtime.   . NON FORMULARY Diet Change: Regular, thin liquids  . polyethylene glycol (MIRALAX / GLYCOLAX) 17 g packet Take 17 g by mouth daily as needed.  . valsartan (DIOVAN) 80 MG tablet Take 80 mg by mouth daily.  . [DISCONTINUED] busPIRone (BUSPAR) 5 MG  tablet Take 5 mg by mouth 2 (two) times daily. For anxiety   No facility-administered encounter medications on file as of 12/10/2020.     SIGNIFICANT DIAGNOSTIC EXAMS   LABS REVIEWED PREVIOUS:    01-15-20: wbc 7.7; hgb 16.5; hct 51.4; mcv 93.1 plt 188; glucose 99; bun 32; creat 1.49; k+ 4.2; na++ 142; ca 8.9; liver normal albumin 3.1; hgb a1c 6.5  05-07-20: urine for micro-albumin: 413.2 ( on ARB)  05-18-20: wbc 9.2; hgb 14.8; hct 45.8; mcv 92.5 plt 270; glucose 128; bun 32; creat 1.60 ;k+ 4.3; na++ 140; ca 8.6 alk phos 142 albumin 2.5 05-19-20: glucose 109; bun  34; creat 1.79; k+ 4.0; na++ 140; ca 8.2 alk phos 145; albumin 2.3  05-22-20: glucose 84; bun 26; creat 1.58; k+ 4.7; na++ 140; ca 8.5 05-25-20: glucose 96; bun 23; creat 1.42; k+ 4.0; na++ 139; ca 8.1  07-23-20: hgb a1c 6.2; chol 79; ldl 48; trig 53; hdl 20  10-29-20: glucose 103; bun 39; creat 1.64; k+ 4.0; na++ 140; ca 8.5   NO NEW LABS.    Review of Systems  Reason unable to perform ROS: expressive aphasia    Physical Exam Constitutional:      General: He is not in acute distress.    Appearance: He is well-developed and well-nourished. He is not diaphoretic.  Neck:     Thyroid: No thyromegaly.  Cardiovascular:     Rate and Rhythm: Normal rate and regular rhythm.     Pulses: Normal pulses and intact distal pulses.     Heart sounds: Normal heart sounds.     Comments: Hx cabg  Pulmonary:     Effort: Pulmonary effort is normal. No respiratory distress.     Breath sounds: Normal breath sounds.  Abdominal:     General: Bowel sounds are normal. There is no distension.     Palpations: Abdomen is soft.     Tenderness: There is no abdominal tenderness.  Genitourinary:    Comments: Foley; penile tear  Musculoskeletal:        General: Normal range of motion.     Cervical back: Neck supple.     Right lower leg: No edema.     Left lower leg: No edema.     Comments: Right hemiplegia   Lymphadenopathy:     Cervical: No cervical  adenopathy.  Skin:    General: Skin is warm and dry.  Neurological:     Mental Status: He is alert. Mental status is at baseline.  Psychiatric:        Mood and Affect: Mood and affect and mood normal.     ASSESSMENT/ PLAN:  TODAY  1. Aortic atherosclerosis ( [place] 01-04-19) 2. Right spastic hemiplegia 3. Hypertension associated with diabetes 4. Type 2 diabetes mellitus with peripheral vascular disease  Will continue current plan of care Will continue current medications Will continue to monitor his status.   MD is aware of resident's narcotic use and is in agreement with current plan of care. We will attempt to wean resident as appropriate.  Ok Edwards NP [facility]  Contact [phone] Monday through Friday 8am- 5pm  After hours call [phone]

## 2020-12-14 DIAGNOSIS — I610 Nontraumatic intracerebral hemorrhage in hemisphere, subcortical: Secondary | ICD-10-CM | POA: Diagnosis not present

## 2020-12-14 DIAGNOSIS — Z1159 Encounter for screening for other viral diseases: Secondary | ICD-10-CM | POA: Diagnosis not present

## 2020-12-17 DIAGNOSIS — I700 Atherosclerosis of aorta: Secondary | ICD-10-CM | POA: Insufficient documentation

## 2020-12-23 DIAGNOSIS — Z1159 Encounter for screening for other viral diseases: Secondary | ICD-10-CM | POA: Diagnosis not present

## 2020-12-23 DIAGNOSIS — I610 Nontraumatic intracerebral hemorrhage in hemisphere, subcortical: Secondary | ICD-10-CM | POA: Diagnosis not present

## 2020-12-30 DIAGNOSIS — I610 Nontraumatic intracerebral hemorrhage in hemisphere, subcortical: Secondary | ICD-10-CM | POA: Diagnosis not present

## 2020-12-30 DIAGNOSIS — Z1159 Encounter for screening for other viral diseases: Secondary | ICD-10-CM | POA: Diagnosis not present

## 2021-01-01 DIAGNOSIS — I610 Nontraumatic intracerebral hemorrhage in hemisphere, subcortical: Secondary | ICD-10-CM | POA: Diagnosis not present

## 2021-01-01 DIAGNOSIS — Z1159 Encounter for screening for other viral diseases: Secondary | ICD-10-CM | POA: Diagnosis not present

## 2021-01-04 DIAGNOSIS — I610 Nontraumatic intracerebral hemorrhage in hemisphere, subcortical: Secondary | ICD-10-CM | POA: Diagnosis not present

## 2021-01-04 DIAGNOSIS — Z1159 Encounter for screening for other viral diseases: Secondary | ICD-10-CM | POA: Diagnosis not present

## 2021-01-06 ENCOUNTER — Non-Acute Institutional Stay (SKILLED_NURSING_FACILITY): Payer: Medicare PPO | Admitting: Adult Health

## 2021-01-06 ENCOUNTER — Encounter: Payer: Self-pay | Admitting: Adult Health

## 2021-01-06 DIAGNOSIS — E1159 Type 2 diabetes mellitus with other circulatory complications: Secondary | ICD-10-CM | POA: Diagnosis not present

## 2021-01-06 DIAGNOSIS — I2510 Atherosclerotic heart disease of native coronary artery without angina pectoris: Secondary | ICD-10-CM | POA: Diagnosis not present

## 2021-01-06 DIAGNOSIS — I610 Nontraumatic intracerebral hemorrhage in hemisphere, subcortical: Secondary | ICD-10-CM | POA: Diagnosis not present

## 2021-01-06 DIAGNOSIS — I152 Hypertension secondary to endocrine disorders: Secondary | ICD-10-CM | POA: Diagnosis not present

## 2021-01-06 DIAGNOSIS — Z1159 Encounter for screening for other viral diseases: Secondary | ICD-10-CM | POA: Diagnosis not present

## 2021-01-06 DIAGNOSIS — I700 Atherosclerosis of aorta: Secondary | ICD-10-CM | POA: Diagnosis not present

## 2021-01-06 NOTE — Progress Notes (Signed)
Location:  [place] Room Number: 130/P Place of Service:  SNF (31)   CODE STATUS: Full Code  No Known Allergies  Chief Complaint  Patient presents with  . Medical Management of Chronic Issues         Aortic atherosclerosis  CAD native heart without  Hypertension associated with type 2 diabetes mellitus:     HPI:  He is a 75 year old long term resident of this facility being seen of the management of his chronic illnesses: Aortic atherosclerosis  CAD native heart without  Hypertension associated with type 2 diabetes mellitus. There are no reports of pain present. No reports of agitation or anxiety. He has a good appetite; no reports of constipatino he does get out of bed daily   Past Medical History:  Diagnosis Date  . A-fib ([place])    a. Post-op afib after CABG 08/2012.  [name] Acute gastric ulcer with hemorrhage   . Acute respiratory failure with hypoxia ([place])   . Aphasia following cerebral infarction   . Atrial fibrillation ([place])   . CAD (coronary artery disease)    a. NSTEMI s/p stent to distal RCA 03/2000. b. Inf MI s/p emergent thrombectomy/stenting mid RCA 10/2000. c. NSTEMI s/p CABGx4 (LIMA-LAD, SVG-OM2, seq SVG-acute marginal and PD) 02/12/28 - course complicated by confusion, post-op AF, L pleural effusion with thoracentesis. d. Normal LV function by echo 08/2012.  . Diabetes mellitus   . Diverticulosis   . Dysphagia following cerebral infarction   . Encephalopathy   . Extended spectrum beta lactamase (ESBL) resistance   . Generalized anxiety disorder   . GERD (gastroesophageal reflux disease)   . Hemiplegia and hemiparesis following cerebral infarction affecting right dominant side ([place])   . Hiatal hernia   . HLD (hyperlipidemia)   . HTN (hypertension)   . Non-ST elevation (NSTEMI) myocardial infarction ([place])   . Nontraumatic intracerebral hemorrhage in hemisphere, subcortical ([place])   . Peripheral vascular disease ([place])    a. Carotid dopplers neg  08/13/12. b. Pre-cabg ABIs - R=0.87 suggesting mild dz, L=1.29 possibly falsely elevated due to calcified vessels.  . Pleural effusion    a. L pleural eff after CABG s/p thoracentesis 08/22/12.  . Prostate cancer [facility])    Status post radiation treatment.  . Prostatic hypertrophy    a. Hx of urinary retention, awaiting TURP.  [name] Severe protein-calorie malnutrition ([place])   . Tobacco abuse   . 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 Valvular heart disease    a. Mild  MR by TEE 08/2012.    Past Surgical History:  Procedure Laterality Date  . APPENDECTOMY    . BACK SURGERY    . CORONARY ARTERY BYPASS GRAFT  08/16/2012   Procedure: CORONARY ARTERY BYPASS GRAFTING (CABG);  Surgeon: Melrose Nakayama, MD;  Location: [facility];  Service: Open Heart Surgery;  Laterality: N/A;  . IR REPLC GASTRO/COLONIC TUBE PERCUT W/FLUORO  02/13/2019  . LEFT HEART CATHETERIZATION WITH CORONARY ANGIOGRAM N/A 08/14/2012   Procedure: LEFT HEART CATHETERIZATION WITH CORONARY ANGIOGRAM;  Surgeon: Peter M Martinique, MD;  Location: [facility] CATH LAB;  Service: Cardiovascular;  Laterality: N/A;    Social History   Socioeconomic History  . Marital status: Married    Spouse name: Not on file  . Number of children: Not on file  . Years of education: Not on file  . Highest education level: Not on file  Occupational History  . Occupation: retired   Tobacco Use  .  Smoking status: Former Smoker    Quit date: 11/12/1972    Years since quitting: 48.1  . Smokeless tobacco: Never Used  Vaping Use  . Vaping Use: Never used  Substance and Sexual Activity  . Alcohol use: No  . Drug use: No  . Sexual activity: Not Currently  Other Topics Concern  . Not on file  Social History Narrative   He is a long term patient of [facility]    Social Determinants of Health   Financial Resource Strain: Not on file  Food Insecurity: Not on file  Transportation Needs: Not on file  Physical Activity: Not on file  Stress: Not on file   Social Connections: Not on file  Intimate Partner Violence: Not on file   Family History  Problem Relation Age of Onset  . Hypertension Mother       VITAL SIGNS BP 137/77   Pulse (!) 55   Temp 97.8 F (36.6 C)   Ht 5' 11"  (1.803 m)   Wt 165 lb (74.8 kg)   BMI 23.01 kg/m   Outpatient Encounter Medications as of 01/06/2021  Medication Sig  . acetaminophen (TYLENOL) 325 MG tablet Take 650 mg by mouth every 4 (four) hours as needed. per standing order for pain/fever DO NOT EXCEED >3,000 mg in 24 hours  . amLODipine (NORVASC) 5 MG tablet Take 5 mg by mouth daily.   [name] atorvastatin (LIPITOR) 40 MG tablet Take 40 mg by mouth at bedtime.   . baclofen (LIORESAL) 10 MG tablet Take 1 tablet by mouth 2 (two) times daily. Hold for sedation  . Eyelid Cleansers (OCUSOFT LID SCRUB EX) Apply 1 each topically daily. Both eyes  . FLUoxetine (PROZAC) 20 MG/5ML solution Take 20 mg by mouth daily.   . Glucerna (GLUCERNA) LIQD Take 237 mLs by mouth daily.  . hydrocortisone cream 1 % Apply rectally twice daily and as needed for hemorrhoids  . Insulin Glargine (LANTUS SOLOSTAR) 100 UNIT/ML Solostar Pen Inject 20 Units into the skin daily.  . insulin lispro (HUMALOG) 100 UNIT/ML cartridge Inject 10 Units into the skin 3 (three) times daily before meals. Give 10 units SQ with meals if Blood Sugar >150  . lansoprazole (PREVACID SOLUTAB) 30 MG disintegrating tablet Take 30 mg by mouth daily. Give before meals  . loperamide (IMODIUM A-D) 2 MG tablet Take 2 mg by mouth as needed for diarrhea or loose stools.  . Melatonin 1 MG TABS Take 2 tablets by mouth at bedtime.   . NON FORMULARY Diet Change: Regular, thin liquids  . polyethylene glycol (MIRALAX / GLYCOLAX) 17 g packet Take 17 g by mouth daily as needed.  . valsartan (DIOVAN) 80 MG tablet Take 80 mg by mouth daily.   No facility-administered encounter medications on file as of 01/06/2021.    SIGNIFICANT DIAGNOSTIC EXAMS  LABS REVIEWED PREVIOUS:     01-15-20: wbc 7.7; hgb 16.5; hct 51.4; mcv 93.1 plt 188; glucose 99; bun 32; creat 1.49; k+ 4.2; na++ 142; ca 8.9; liver normal albumin 3.1; hgb a1c 6.5  05-07-20: urine for micro-albumin: 413.2 ( on ARB)  05-18-20: wbc 9.2; hgb 14.8; hct 45.8; mcv 92.5 plt 270; glucose 128; bun 32; creat 1.60 ;k+ 4.3; na++ 140; ca 8.6 alk phos 142 albumin 2.5 05-19-20: glucose 109; bun 34; creat 1.79; k+ 4.0; na++ 140; ca 8.2 alk phos 145; albumin 2.3  05-22-20: glucose 84; bun 26; creat 1.58; k+ 4.7; na++ 140; ca 8.5 05-25-20: glucose 96; bun 23; creat 1.42; k+  4.0; na++ 139; ca 8.1  07-23-20: hgb a1c 6.2; chol 79; ldl 48; trig 53; hdl 20  10-29-20: glucose 103; bun 39; creat 1.64; k+ 4.0; na++ 140; ca 8.5   NO NEW LABS.   Review of Systems  Reason unable to perform ROS: expressive aphasia    Physical Exam Constitutional:      General: He is not in acute distress.    Appearance: He is well-developed and well-nourished. He is not diaphoretic.  Neck:     Thyroid: No thyromegaly.  Cardiovascular:     Rate and Rhythm: Normal rate and regular rhythm.     Pulses: Normal pulses and intact distal pulses.     Heart sounds: Normal heart sounds.     Comments: Hx cabg  Pulmonary:     Effort: Pulmonary effort is normal. No respiratory distress.     Breath sounds: Normal breath sounds.  Abdominal:     General: Bowel sounds are normal. There is no distension.     Palpations: Abdomen is soft.     Tenderness: There is no abdominal tenderness.  Genitourinary:    Comments:  Foley; penile tear  Musculoskeletal:        General: No edema.     Cervical back: Neck supple.     Right lower leg: No edema.     Left lower leg: No edema.     Comments: Right hemiplegia    Lymphadenopathy:     Cervical: No cervical adenopathy.  Skin:    General: Skin is warm and dry.  Neurological:     Mental Status: He is alert. Mental status is at baseline.  Psychiatric:        Mood and Affect: Mood and affect and mood normal.       ASSESSMENT/ PLAN:  TODAY:   1. Aortic atherosclerosis (ct 01-04-19) will monitor   2. CAD native heart without angina: is status post cabg 2012 will continue diovan 80 mg daily   3. Hypertension associated with type 2 diabetes mellitus: is stable b/p 137/77 will continue noravsc 5 mg daily and diovan 80 mg daily   PREVIOUS   4. Dyslipidemia associated with type 2 diabetes mellitus is stable LDL 48 will continue lipitor 40 mg daily   5. Micro-albuminuria due to type 2 diabetes mellitus is without change is 413.2 is on ARB   6. Major depression with psychotic features: is stable will continue prozac 20 mg daily is off buspar; is off antipsychotics  7. CKD stage 3 due to type 2 diabetes mellitus: is stable bun 39; creat 1.64 will monitor  8. Chronic urine retention is stable has long term foley will monitor   9. Type 2 diabetes mellitus with peripheral vascular disease is stable hgb a1c 6.2 will continue lantus 20 units nightly and novolog with 10 units with meals.  10. Protein calorie malnutrition severe; weight is stable weight is 161 pounds albumin 3.1 will monitor   11. Nontraumatic subcortical hemorrhage of left cerebra hemisphere/hemorrhagic cerebrovascular accident right hemiparesis is stable will continue baclofen 10 mg twice daily for spasticity.          MD is aware of resident's narcotic use and is in agreement with current plan of care. We will attempt to wean resident as appropriate.  Ok Edwards NP [facility]  Contact [phone] Monday through Friday 8am- 5pm  After hours call [phone]

## 2021-01-07 ENCOUNTER — Other Ambulatory Visit (HOSPITAL_COMMUNITY)
Admission: RE | Admit: 2021-01-07 | Discharge: 2021-01-07 | Disposition: A | Discharge status: Home / Self Care | Payer: Medicare PPO | Source: Skilled Nursing Facility | Attending: Adult Health | Admitting: Adult Health

## 2021-01-07 DIAGNOSIS — E1165 Type 2 diabetes mellitus with hyperglycemia: Secondary | ICD-10-CM | POA: Insufficient documentation

## 2021-01-07 LAB — LIPID PANEL
Cholesterol: 101 mg/dL (ref 0–200)
HDL: 26 mg/dL — ABNORMAL LOW (ref >40)
LDL Cholesterol: 60 mg/dL (ref 0–99)
Total CHOL/HDL Ratio: 3.9 RATIO
Triglycerides: 77 mg/dL (ref <150)
VLDL: 15 mg/dL (ref 0–40)

## 2021-01-07 LAB — CBC
HCT: 47.7 % (ref 39.0–52.0)
Hemoglobin: 15.3 g/dL (ref 13.0–17.0)
MCH: 30.1 pg (ref 26.0–34.0)
MCHC: 32.1 g/dL (ref 30.0–36.0)
MCV: 93.7 fL (ref 80.0–100.0)
Platelets: 180 10*3/uL (ref 150–400)
RBC: 5.09 MIL/uL (ref 4.22–5.81)
RDW: 13.1 % (ref 11.5–15.5)
WBC: 7.8 10*3/uL (ref 4.0–10.5)
nRBC: 0 % (ref 0.0–0.2)

## 2021-01-07 LAB — COMPREHENSIVE METABOLIC PANEL
ALT: 20 U/L (ref 0–44)
AST: 19 U/L (ref 15–41)
Albumin: 2.8 g/dL — ABNORMAL LOW (ref 3.5–5.0)
Alkaline Phosphatase: 111 U/L (ref 38–126)
Anion gap: 4 — ABNORMAL LOW (ref 5–15)
BUN: 45 mg/dL — ABNORMAL HIGH (ref 8–23)
CO2: 28 mmol/L (ref 22–32)
Calcium: 8.6 mg/dL — ABNORMAL LOW (ref 8.9–10.3)
Chloride: 108 mmol/L (ref 98–111)
Creatinine, Ser: 1.85 mg/dL — ABNORMAL HIGH (ref 0.61–1.24)
GFR, Estimated: 37 mL/min — ABNORMAL LOW (ref >60)
Glucose, Bld: 101 mg/dL — ABNORMAL HIGH (ref 70–99)
Potassium: 4.4 mmol/L (ref 3.5–5.1)
Sodium: 140 mmol/L (ref 135–145)
Total Bilirubin: 0.8 mg/dL (ref 0.3–1.2)
Total Protein: 6.3 g/dL — ABNORMAL LOW (ref 6.5–8.1)

## 2021-01-07 LAB — HEMOGLOBIN A1C
Hgb A1c MFr Bld: 6.5 % — ABNORMAL HIGH (ref 4.8–5.6)
Mean Plasma Glucose: 139.85 mg/dL

## 2021-01-07 LAB — TSH: TSH: 1.908 u[IU]/mL (ref 0.350–4.500)

## 2021-01-08 DIAGNOSIS — I610 Nontraumatic intracerebral hemorrhage in hemisphere, subcortical: Secondary | ICD-10-CM | POA: Diagnosis not present

## 2021-01-08 DIAGNOSIS — Z1159 Encounter for screening for other viral diseases: Secondary | ICD-10-CM | POA: Diagnosis not present

## 2021-01-11 DIAGNOSIS — I610 Nontraumatic intracerebral hemorrhage in hemisphere, subcortical: Secondary | ICD-10-CM | POA: Diagnosis not present

## 2021-01-11 DIAGNOSIS — Z1159 Encounter for screening for other viral diseases: Secondary | ICD-10-CM | POA: Diagnosis not present

## 2021-01-13 DIAGNOSIS — I610 Nontraumatic intracerebral hemorrhage in hemisphere, subcortical: Secondary | ICD-10-CM | POA: Diagnosis not present

## 2021-01-13 DIAGNOSIS — Z1159 Encounter for screening for other viral diseases: Secondary | ICD-10-CM | POA: Diagnosis not present

## 2021-01-15 DIAGNOSIS — Z1159 Encounter for screening for other viral diseases: Secondary | ICD-10-CM | POA: Diagnosis not present

## 2021-01-15 DIAGNOSIS — I610 Nontraumatic intracerebral hemorrhage in hemisphere, subcortical: Secondary | ICD-10-CM | POA: Diagnosis not present

## 2021-01-18 DIAGNOSIS — I610 Nontraumatic intracerebral hemorrhage in hemisphere, subcortical: Secondary | ICD-10-CM | POA: Diagnosis not present

## 2021-01-18 DIAGNOSIS — Z1159 Encounter for screening for other viral diseases: Secondary | ICD-10-CM | POA: Diagnosis not present

## 2021-01-20 DIAGNOSIS — I610 Nontraumatic intracerebral hemorrhage in hemisphere, subcortical: Secondary | ICD-10-CM | POA: Diagnosis not present

## 2021-01-20 DIAGNOSIS — Z1159 Encounter for screening for other viral diseases: Secondary | ICD-10-CM | POA: Diagnosis not present

## 2021-01-22 DIAGNOSIS — I610 Nontraumatic intracerebral hemorrhage in hemisphere, subcortical: Secondary | ICD-10-CM | POA: Diagnosis not present

## 2021-01-22 DIAGNOSIS — Z1159 Encounter for screening for other viral diseases: Secondary | ICD-10-CM | POA: Diagnosis not present

## 2021-01-25 DIAGNOSIS — Z1159 Encounter for screening for other viral diseases: Secondary | ICD-10-CM | POA: Diagnosis not present

## 2021-01-25 DIAGNOSIS — E1140 Type 2 diabetes mellitus with diabetic neuropathy, unspecified: Secondary | ICD-10-CM | POA: Diagnosis not present

## 2021-01-25 DIAGNOSIS — L602 Onychogryphosis: Secondary | ICD-10-CM | POA: Diagnosis not present

## 2021-01-25 DIAGNOSIS — I610 Nontraumatic intracerebral hemorrhage in hemisphere, subcortical: Secondary | ICD-10-CM | POA: Diagnosis not present

## 2021-01-27 DIAGNOSIS — I610 Nontraumatic intracerebral hemorrhage in hemisphere, subcortical: Secondary | ICD-10-CM | POA: Diagnosis not present

## 2021-01-27 DIAGNOSIS — Z1159 Encounter for screening for other viral diseases: Secondary | ICD-10-CM | POA: Diagnosis not present

## 2021-01-29 DIAGNOSIS — F5105 Insomnia due to other mental disorder: Secondary | ICD-10-CM | POA: Diagnosis not present

## 2021-01-29 DIAGNOSIS — F333 Major depressive disorder, recurrent, severe with psychotic symptoms: Secondary | ICD-10-CM | POA: Diagnosis not present

## 2021-02-03 DIAGNOSIS — I615 Nontraumatic intracerebral hemorrhage, intraventricular: Secondary | ICD-10-CM | POA: Diagnosis not present

## 2021-02-03 DIAGNOSIS — I6990 Unspecified sequelae of unspecified cerebrovascular disease: Secondary | ICD-10-CM | POA: Diagnosis not present

## 2021-02-03 DIAGNOSIS — G8191 Hemiplegia, unspecified affecting right dominant side: Secondary | ICD-10-CM | POA: Diagnosis not present

## 2021-02-03 DIAGNOSIS — I6930 Unspecified sequelae of cerebral infarction: Secondary | ICD-10-CM | POA: Diagnosis not present

## 2021-02-03 DIAGNOSIS — R4701 Aphasia: Secondary | ICD-10-CM | POA: Diagnosis not present

## 2021-02-03 DIAGNOSIS — H53461 Homonymous bilateral field defects, right side: Secondary | ICD-10-CM | POA: Diagnosis not present

## 2021-02-04 ENCOUNTER — Encounter: Payer: Self-pay | Admitting: Adult Health

## 2021-02-04 ENCOUNTER — Non-Acute Institutional Stay (SKILLED_NURSING_FACILITY): Payer: Medicare PPO | Admitting: Adult Health

## 2021-02-04 DIAGNOSIS — E785 Hyperlipidemia, unspecified: Secondary | ICD-10-CM

## 2021-02-04 DIAGNOSIS — F323 Major depressive disorder, single episode, severe with psychotic features: Secondary | ICD-10-CM

## 2021-02-04 DIAGNOSIS — E1169 Type 2 diabetes mellitus with other specified complication: Secondary | ICD-10-CM | POA: Diagnosis not present

## 2021-02-04 DIAGNOSIS — R809 Proteinuria, unspecified: Secondary | ICD-10-CM

## 2021-02-04 DIAGNOSIS — E1129 Type 2 diabetes mellitus with other diabetic kidney complication: Secondary | ICD-10-CM

## 2021-02-04 NOTE — Progress Notes (Signed)
Location:  [place] Room Number: 130/P Place of Service:  SNF (31)   CODE STATUS: Full Code  No Known Allergies  Chief Complaint  Patient presents with  . Medical Management of Chronic Issues           Dyslipidemia associated with type 2 diabetes mellitus:    Micro-albumin due to type 2 diabetes mellitus:   Major depression with psychotic features     HPI:    Past Medical History:  Diagnosis Date  . A-fib ([place])    a. Post-op afib after CABG 08/2012.  [name] Acute gastric ulcer with hemorrhage   . Acute respiratory failure with hypoxia ([place])   . Aphasia following cerebral infarction   . Atrial fibrillation ([place])   . CAD (coronary artery disease)    a. NSTEMI s/p stent to distal RCA 03/2000. b. Inf MI s/p emergent thrombectomy/stenting mid RCA 10/2000. c. NSTEMI s/p CABGx4 (LIMA-LAD, SVG-OM2, seq SVG-acute marginal and PD) 03/18/55 - course complicated by confusion, post-op AF, L pleural effusion with thoracentesis. d. Normal LV function by echo 08/2012.  . Diabetes mellitus   . Diverticulosis   . Dysphagia following cerebral infarction   . Encephalopathy   . Extended spectrum beta lactamase (ESBL) resistance   . Generalized anxiety disorder   . GERD (gastroesophageal reflux disease)   . Hemiplegia and hemiparesis following cerebral infarction affecting right dominant side ([place])   . Hiatal hernia   . HLD (hyperlipidemia)   . HTN (hypertension)   . Non-ST elevation (NSTEMI) myocardial infarction ([place])   . Nontraumatic intracerebral hemorrhage in hemisphere, subcortical ([place])   . Peripheral vascular disease ([place])    a. Carotid dopplers neg 08/13/12. b. Pre-cabg ABIs - R=0.87 suggesting mild dz, L=1.29 possibly falsely elevated due to calcified vessels.  . Pleural effusion    a. L pleural eff after CABG s/p thoracentesis 08/22/12.  . Prostate cancer [facility])    Status post radiation treatment.  . Prostatic hypertrophy    a. Hx of urinary retention, awaiting  TURP.  [name] Severe protein-calorie malnutrition ([place])   . Tobacco abuse   . 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 Valvular heart disease    a. Mild  MR by TEE 08/2012.    Past Surgical History:  Procedure Laterality Date  . APPENDECTOMY    . BACK SURGERY    . CORONARY ARTERY BYPASS GRAFT  08/16/2012   Procedure: CORONARY ARTERY BYPASS GRAFTING (CABG);  Surgeon: Melrose Nakayama, MD;  Location: [place];  Service: Open Heart Surgery;  Laterality: N/A;  . IR REPLC GASTRO/COLONIC TUBE PERCUT W/FLUORO  02/13/2019  . LEFT HEART CATHETERIZATION WITH CORONARY ANGIOGRAM N/A 08/14/2012   Procedure: LEFT HEART CATHETERIZATION WITH CORONARY ANGIOGRAM;  Surgeon: Peter M Martinique, MD;  Location: [facility] CATH LAB;  Service: Cardiovascular;  Laterality: N/A;    Social History   Socioeconomic History  . Marital status: Married    Spouse name: Not on file  . Number of children: Not on file  . Years of education: Not on file  . Highest education level: Not on file  Occupational History  . Occupation: retired   Tobacco Use  . Smoking status: Former Smoker    Quit date: 11/12/1972    Years since quitting: 48.2  . Smokeless tobacco: Never Used  Vaping Use  . Vaping Use: Never used  Substance and Sexual Activity  . Alcohol use: No  . Drug use: No  . Sexual activity: Not  Currently  Other Topics Concern  . Not on file  Social History Narrative   He is a long term patient of [facility]    Social Determinants of Health   Financial Resource Strain: Not on file  Food Insecurity: Not on file  Transportation Needs: Not on file  Physical Activity: Not on file  Stress: Not on file  Social Connections: Not on file  Intimate Partner Violence: Not on file   Family History  Problem Relation Age of Onset  . Hypertension Mother       VITAL SIGNS BP (!) 150/78   Pulse 66   Temp (!) 97.5 F (36.4 C)   Ht 5' 11"  (1.803 m)   Wt 167 lb 6.4 oz (75.9 kg)   BMI 23.35 kg/m   Outpatient  Encounter Medications as of 02/04/2021  Medication Sig  . acetaminophen (TYLENOL) 325 MG tablet Take 650 mg by mouth every 4 (four) hours as needed. per standing order for pain/fever DO NOT EXCEED >3,000 mg in 24 hours  . amLODipine (NORVASC) 5 MG tablet Take 5 mg by mouth daily.   [name] atorvastatin (LIPITOR) 40 MG tablet Take 40 mg by mouth at bedtime.   . baclofen (LIORESAL) 10 MG tablet Take 1 tablet by mouth 2 (two) times daily. Hold for sedation  . Balsam [place]-Castor Oil (VENELEX) OINT Apply topically. Special Instructions: Apply to sacrum, coccyx and bilateral buttocks qshift for prevention/ redness. Every Shift Day, Evening, Night  . Eyelid Cleansers (OCUSOFT LID SCRUB EX) daily. Cleanse both eye lids  . FLUoxetine (PROZAC) 20 MG/5ML solution Take 20 mg by mouth daily.   . Glucerna (GLUCERNA) LIQD Take 237 mLs by mouth daily.  . hydrocortisone cream 1 % Apply rectally twice daily and as needed for hemorrhoids  . Insulin Glargine (LANTUS SOLOSTAR) 100 UNIT/ML Solostar Pen Inject 20 Units into the skin daily.  . insulin lispro (HUMALOG) 100 UNIT/ML cartridge Inject 10 Units into the skin 3 (three) times daily before meals. Give 10 units SQ with meals if Blood Sugar >150  . lansoprazole (PREVACID SOLUTAB) 30 MG disintegrating tablet Take 30 mg by mouth daily. Give before meals  . loperamide (IMODIUM A-D) 2 MG tablet Take 2 mg by mouth as needed for diarrhea or loose stools.  . Melatonin 1 MG TABS Take 2 tablets by mouth at bedtime.   . NON FORMULARY Diet Change: Regular, thin liquids  . polyethylene glycol (MIRALAX / GLYCOLAX) 17 g packet Take 17 g by mouth daily as needed.  . valsartan (DIOVAN) 80 MG tablet Take 80 mg by mouth daily.   No facility-administered encounter medications on file as of 02/04/2021.     SIGNIFICANT DIAGNOSTIC EXAMS   LABS REVIEWED PREVIOUS:    01-15-20: wbc 7.7; hgb 16.5; hct 51.4; mcv 93.1 plt 188; glucose 99; bun 32; creat 1.49; k+ 4.2; na++ 142; ca 8.9; liver  normal albumin 3.1; hgb a1c 6.5  05-07-20: urine for micro-albumin: 413.2 ( on ARB)  05-18-20: wbc 9.2; hgb 14.8; hct 45.8; mcv 92.5 plt 270; glucose 128; bun 32; creat 1.60 ;k+ 4.3; na++ 140; ca 8.6 alk phos 142 albumin 2.5 05-19-20: glucose 109; bun 34; creat 1.79; k+ 4.0; na++ 140; ca 8.2 alk phos 145; albumin 2.3  05-22-20: glucose 84; bun 26; creat 1.58; k+ 4.7; na++ 140; ca 8.5 05-25-20: glucose 96; bun 23; creat 1.42; k+ 4.0; na++ 139; ca 8.1  07-23-20: hgb a1c 6.2; chol 79; ldl 48; trig 53; hdl 20  10-29-20: glucose 103;  bun 39; creat 1.64; k+ 4.0; na++ 140; ca 8.5   NO NEW LABS.   Review of Systems  Reason unable to perform ROS: expressive aphasia    Physical Exam Constitutional:      General: He is not in acute distress.    Appearance: He is well-developed and well-nourished. He is not diaphoretic.  Neck:     Thyroid: No thyromegaly.  Cardiovascular:     Rate and Rhythm: Normal rate and regular rhythm.     Pulses: Normal pulses and intact distal pulses.     Heart sounds: Normal heart sounds.     Comments: Hx cabg Pulmonary:     Effort: Pulmonary effort is normal. No respiratory distress.     Breath sounds: Normal breath sounds.  Abdominal:     General: Bowel sounds are normal. There is no distension.     Palpations: Abdomen is soft.     Tenderness: There is no abdominal tenderness.  Genitourinary:    Comments: Foley penile tear  Musculoskeletal:        General: No edema.     Cervical back: Neck supple.     Right lower leg: No edema.     Left lower leg: No edema.     Comments: Right hemiplegia   Lymphadenopathy:     Cervical: No cervical adenopathy.  Skin:    General: Skin is warm and dry.  Neurological:     Mental Status: He is alert. Mental status is at baseline.  Psychiatric:        Mood and Affect: Mood and affect and mood normal.     ASSESSMENT/ PLAN:  TODAY:   1. Dyslipidemia associated with type 2 diabetes mellitus: is stable LDL 48 will continue lipitor  40 mg daily  2. Micro-albumin due to type 2 diabetes mellitus: is without change is 413.2 is on ARB  3. Major depression with psychotic features is stable will continue prozac 20 mg daily is off buspar is off antipsychotics   PREVIOUS   4. CKD stage 3 due to type 2 diabetes mellitus: is stable bun 39; creat 1.64 will monitor  5. Chronic urine retention is stable has long term foley will monitor   6. Type 2 diabetes mellitus with peripheral vascular disease is stable hgb a1c 6.2 will continue lantus 20 units nightly and novolog with 10 units with meals.  7. Protein calorie malnutrition severe; weight is stable weight is 167 pounds albumin 3.1 will monitor   8. Nontraumatic subcortical hemorrhage of left cerebra hemisphere/hemorrhagic cerebrovascular accident right hemiparesis is stable will continue baclofen 10 mg twice daily for spasticity.    10. Aortic atherosclerosis (ct 01-04-19) will monitor   11. CAD native heart without angina: is status post cabg 2012 will continue diovan 80 mg daily   12. Hypertension associated with type 2 diabetes mellitus: is stable b/p 150/78 will continue noravsc 5 mg daily and diovan 80 mg daily       MD is aware of resident's narcotic use and is in agreement with current plan of care. We will attempt to wean resident as appropriate.  Ok Edwards NP [facility]  Contact [phone] Monday through Friday 8am- 5pm  After hours call [phone]

## 2021-02-10 DIAGNOSIS — H2511 Age-related nuclear cataract, right eye: Secondary | ICD-10-CM | POA: Diagnosis not present

## 2021-02-10 DIAGNOSIS — E113293 Type 2 diabetes mellitus with mild nonproliferative diabetic retinopathy without macular edema, bilateral: Secondary | ICD-10-CM | POA: Diagnosis not present

## 2021-02-10 DIAGNOSIS — H524 Presbyopia: Secondary | ICD-10-CM | POA: Diagnosis not present

## 2021-02-10 DIAGNOSIS — Z961 Presence of intraocular lens: Secondary | ICD-10-CM | POA: Diagnosis not present

## 2021-02-12 ENCOUNTER — Encounter: Payer: Self-pay | Admitting: Adult Health

## 2021-02-12 ENCOUNTER — Non-Acute Institutional Stay (SKILLED_NURSING_FACILITY): Payer: Medicare PPO | Admitting: Adult Health

## 2021-02-12 DIAGNOSIS — N3289 Other specified disorders of bladder: Secondary | ICD-10-CM

## 2021-02-12 NOTE — Progress Notes (Signed)
Location:  [place] Room Number: 2232 Place of Service:  SNF (31)   CODE STATUS: full  No Known Allergies  Chief Complaint  Patient presents with  . Acute Visit     Bladder spasms     HPI:  He has needed to have his foley changed more frequently due to leakage. There are no changes in his urine. No reports of fevers present. The leakage is intermittent and has been happening over the past week hie wife is concerned about the leakage.   Past Medical History:  Diagnosis Date  . A-fib ([place])    a. Post-op afib after CABG 08/2012.  [name] Acute gastric ulcer with hemorrhage   . Acute respiratory failure with hypoxia ([place])   . Aphasia following cerebral infarction   . Atrial fibrillation ([place])   . CAD (coronary artery disease)    a. NSTEMI s/p stent to distal RCA 03/2000. b. Inf MI s/p emergent thrombectomy/stenting mid RCA 10/2000. c. NSTEMI s/p CABGx4 (LIMA-LAD, SVG-OM2, seq SVG-acute marginal and PD) 06/19/23 - course complicated by confusion, post-op AF, L pleural effusion with thoracentesis. d. Normal LV function by echo 08/2012.  . Diabetes mellitus   . Diverticulosis   . Dysphagia following cerebral infarction   . Encephalopathy   . Extended spectrum beta lactamase (ESBL) resistance   . Generalized anxiety disorder   . GERD (gastroesophageal reflux disease)   . Hemiplegia and hemiparesis following cerebral infarction affecting right dominant side ([place])   . Hiatal hernia   . HLD (hyperlipidemia)   . HTN (hypertension)   . Non-ST elevation (NSTEMI) myocardial infarction ([place])   . Nontraumatic intracerebral hemorrhage in hemisphere, subcortical ([place])   . Peripheral vascular disease ([place])    a. Carotid dopplers neg 08/13/12. b. Pre-cabg ABIs - R=0.87 suggesting mild dz, L=1.29 possibly falsely elevated due to calcified vessels.  . Pleural effusion    a. L pleural eff after CABG s/p thoracentesis 08/22/12.  . Prostate cancer [facility])    Status post radiation  treatment.  . Prostatic hypertrophy    a. Hx of urinary retention, awaiting TURP.  [name] Severe protein-calorie malnutrition ([place])   . Tobacco abuse   . 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 Valvular heart disease    a. Mild  MR by TEE 08/2012.    Past Surgical History:  Procedure Laterality Date  . APPENDECTOMY    . BACK SURGERY    . CORONARY ARTERY BYPASS GRAFT  08/16/2012   Procedure: CORONARY ARTERY BYPASS GRAFTING (CABG);  Surgeon: Melrose Nakayama, MD;  Location: [place];  Service: Open Heart Surgery;  Laterality: N/A;  . IR REPLC GASTRO/COLONIC TUBE PERCUT W/FLUORO  02/13/2019  . LEFT HEART CATHETERIZATION WITH CORONARY ANGIOGRAM N/A 08/14/2012   Procedure: LEFT HEART CATHETERIZATION WITH CORONARY ANGIOGRAM;  Surgeon: Peter M Martinique, MD;  Location: [facility] CATH LAB;  Service: Cardiovascular;  Laterality: N/A;    Social History   Socioeconomic History  . Marital status: Married    Spouse name: Not on file  . Number of children: Not on file  . Years of education: Not on file  . Highest education level: Not on file  Occupational History  . Occupation: retired   Tobacco Use  . Smoking status: Former Smoker    Quit date: 11/12/1972    Years since quitting: 48.2  . Smokeless tobacco: Never Used  Vaping Use  . Vaping Use: Never used  Substance and Sexual Activity  .  Alcohol use: No  . Drug use: No  . Sexual activity: Not Currently  Other Topics Concern  . Not on file  Social History Narrative   He is a long term patient of [facility]    Social Determinants of Health   Financial Resource Strain: Not on file  Food Insecurity: Not on file  Transportation Needs: Not on file  Physical Activity: Not on file  Stress: Not on file  Social Connections: Not on file  Intimate Partner Violence: Not on file   Family History  Problem Relation Age of Onset  . Hypertension Mother       VITAL SIGNS BP 129/75   Pulse 71   Temp (!) 97.5 F (36.4 C)   Resp 18   Ht 5'  11" (1.803 m)   Wt 165 lb 3.2 oz (74.9 kg)   SpO2 98%   BMI 23.04 kg/m   Outpatient Encounter Medications as of 02/12/2021  Medication Sig  . acetaminophen (TYLENOL) 325 MG tablet Take 650 mg by mouth every 4 (four) hours as needed. per standing order for pain/fever DO NOT EXCEED >3,000 mg in 24 hours  . amLODipine (NORVASC) 5 MG tablet Take 5 mg by mouth daily.   [name] atorvastatin (LIPITOR) 40 MG tablet Take 40 mg by mouth at bedtime.   . baclofen (LIORESAL) 10 MG tablet Take 1 tablet by mouth 2 (two) times daily. Hold for sedation  . Balsam [place]-Castor Oil (VENELEX) OINT Apply topically. Special Instructions: Apply to sacrum, coccyx and bilateral buttocks qshift for prevention/ redness. Every Shift Day, Evening, Night  . Eyelid Cleansers (OCUSOFT LID SCRUB EX) daily. Cleanse both eye lids  . FLUoxetine (PROZAC) 20 MG/5ML solution Take 20 mg by mouth daily.   . Glucerna (GLUCERNA) LIQD Take 237 mLs by mouth daily.  . hydrocortisone cream 1 % Apply rectally twice daily and as needed for hemorrhoids  . Insulin Glargine (LANTUS SOLOSTAR) 100 UNIT/ML Solostar Pen Inject 20 Units into the skin daily.  . insulin lispro (HUMALOG) 100 UNIT/ML cartridge Inject 10 Units into the skin 3 (three) times daily before meals. Give 10 units SQ with meals if Blood Sugar >150  . lansoprazole (PREVACID SOLUTAB) 30 MG disintegrating tablet Take 30 mg by mouth daily. Give before meals  . loperamide (IMODIUM A-D) 2 MG tablet Take 2 mg by mouth as needed for diarrhea or loose stools.  . Melatonin 1 MG TABS Take 2 tablets by mouth at bedtime.   . NON FORMULARY Diet Change: Regular, thin liquids  . polyethylene glycol (MIRALAX / GLYCOLAX) 17 g packet Take 17 g by mouth daily as needed.  . valsartan (DIOVAN) 80 MG tablet Take 80 mg by mouth daily.   No facility-administered encounter medications on file as of 02/12/2021.     SIGNIFICANT DIAGNOSTIC EXAMS   LABS REVIEWED PREVIOUS:    05-07-20: urine for  micro-albumin: 413.2 ( on ARB)  05-18-20: wbc 9.2; hgb 14.8; hct 45.8; mcv 92.5 plt 270; glucose 128; bun 32; creat 1.60 ;k+ 4.3; na++ 140; ca 8.6 alk phos 142 albumin 2.5 05-19-20: glucose 109; bun 34; creat 1.79; k+ 4.0; na++ 140; ca 8.2 alk phos 145; albumin 2.3  05-22-20: glucose 84; bun 26; creat 1.58; k+ 4.7; na++ 140; ca 8.5 05-25-20: glucose 96; bun 23; creat 1.42; k+ 4.0; na++ 139; ca 8.1  07-23-20: hgb a1c 6.2; chol 79; ldl 48; trig 53; hdl 20  10-29-20: glucose 103; bun 39; creat 1.64; k+ 4.0; na++ 140; ca 8.5  NO NEW LABS.   Review of Systems  Reason unable to perform ROS: expressive aphasia       Physical Exam Constitutional:      General: He is not in acute distress.    Appearance: He is well-developed and well-nourished. He is not diaphoretic.  Neck:     Thyroid: No thyromegaly.  Cardiovascular:     Rate and Rhythm: Normal rate and regular rhythm.     Pulses: Normal pulses and intact distal pulses.     Heart sounds: Normal heart sounds.     Comments: Hx cabg  Pulmonary:     Effort: Pulmonary effort is normal. No respiratory distress.     Breath sounds: Normal breath sounds.  Abdominal:     General: Bowel sounds are normal. There is no distension.     Palpations: Abdomen is soft.     Tenderness: There is no abdominal tenderness.  Genitourinary:    Comments: Foley penile tare Urine is clear Musculoskeletal:        General: No edema.     Cervical back: Neck supple.     Right lower leg: No edema.     Left lower leg: No edema.     Comments: Right hemiplegia   Lymphadenopathy:     Cervical: No cervical adenopathy.  Skin:    General: Skin is warm and dry.  Neurological:     Mental Status: He is alert. Mental status is at baseline.  Psychiatric:        Mood and Affect: Mood and affect and mood normal.      ASSESSMENT/ PLAN:  TODAY  1. Bladder spasms: is worse will begin ditropan 5 mg twice daily he does have a history of urine retention; however he does have  a long term foley, will monitor   Ok Edwards NP [facility]  Contact [phone] Monday through Friday 8am- 5pm  After hours call [phone]

## 2021-02-15 ENCOUNTER — Other Ambulatory Visit (HOSPITAL_COMMUNITY)
Admission: RE | Admit: 2021-02-15 | Discharge: 2021-02-15 | Disposition: A | Discharge status: Home / Self Care | Payer: Medicare PPO | Source: Skilled Nursing Facility | Attending: Adult Health | Admitting: Adult Health

## 2021-02-15 DIAGNOSIS — N390 Urinary tract infection, site not specified: Secondary | ICD-10-CM | POA: Insufficient documentation

## 2021-02-15 LAB — URINALYSIS, ROUTINE W REFLEX MICROSCOPIC
Bilirubin Urine: NEGATIVE
Glucose, UA: NEGATIVE mg/dL
Ketones, ur: NEGATIVE mg/dL
Nitrite: POSITIVE — AB
Protein, ur: >=300 mg/dL — AB
Specific Gravity, Urine: 1.015 (ref 1.005–1.030)
WBC, UA: >50 WBC/hpf — ABNORMAL HIGH (ref 0–5)
pH: 6 (ref 5.0–8.0)

## 2021-02-17 ENCOUNTER — Other Ambulatory Visit (HOSPITAL_COMMUNITY)
Admission: RE | Admit: 2021-02-17 | Discharge: 2021-02-17 | Disposition: A | Discharge status: Home / Self Care | Payer: Medicare PPO | Source: Skilled Nursing Facility | Attending: Adult Health | Admitting: Adult Health

## 2021-02-17 DIAGNOSIS — N390 Urinary tract infection, site not specified: Secondary | ICD-10-CM | POA: Diagnosis not present

## 2021-02-17 LAB — URINALYSIS, ROUTINE W REFLEX MICROSCOPIC
Bilirubin Urine: NEGATIVE
Glucose, UA: 50 mg/dL — AB
Ketones, ur: NEGATIVE mg/dL
Nitrite: POSITIVE — AB
Protein, ur: 100 mg/dL — AB
Specific Gravity, Urine: 1.017 (ref 1.005–1.030)
WBC, UA: >50 WBC/hpf — ABNORMAL HIGH (ref 0–5)
pH: 5 (ref 5.0–8.0)

## 2021-02-17 LAB — URINE CULTURE

## 2021-02-19 LAB — URINE CULTURE

## 2021-02-26 DIAGNOSIS — F333 Major depressive disorder, recurrent, severe with psychotic symptoms: Secondary | ICD-10-CM | POA: Diagnosis not present

## 2021-02-26 DIAGNOSIS — F5105 Insomnia due to other mental disorder: Secondary | ICD-10-CM | POA: Diagnosis not present

## 2021-03-03 ENCOUNTER — Encounter: Payer: Self-pay | Admitting: Adult Health

## 2021-03-03 ENCOUNTER — Non-Acute Institutional Stay (SKILLED_NURSING_FACILITY): Payer: Medicare PPO | Admitting: Adult Health

## 2021-03-03 DIAGNOSIS — N183 Chronic kidney disease, stage 3 unspecified: Secondary | ICD-10-CM | POA: Diagnosis not present

## 2021-03-03 DIAGNOSIS — Z978 Presence of other specified devices: Secondary | ICD-10-CM | POA: Diagnosis not present

## 2021-03-03 DIAGNOSIS — E1151 Type 2 diabetes mellitus with diabetic peripheral angiopathy without gangrene: Secondary | ICD-10-CM | POA: Diagnosis not present

## 2021-03-03 DIAGNOSIS — R339 Retention of urine, unspecified: Secondary | ICD-10-CM | POA: Diagnosis not present

## 2021-03-03 DIAGNOSIS — E1122 Type 2 diabetes mellitus with diabetic chronic kidney disease: Secondary | ICD-10-CM

## 2021-03-03 NOTE — Progress Notes (Signed)
Location:  [facility] Room Number: 130/P Place of Service:  SNF (31)   CODE STATUS: Full Code  No Known Allergies  Chief Complaint  Patient presents with  . Medical Management of Chronic Issues          CKD stage 3 due to type 2 diabetes mellitus:  Chronic urine retention with bladder spasms;  Type 2 diabetes mellitus with peripheral vascular disease      HPI:  She is a 75 year old long term resident of this facility being seen for the management of her chronic illnesses: CKD stage 3 due to type 2 diabetes mellitus:  Chronic urine retention with bladder spasms;  Type 2 diabetes mellitus with peripheral vascular disease . There are no reports of uncontrolled pain; no changes in appetite. Is tolerating ditropan for his bladder spasms. He is tolerating the dose reduction of his prozac.   Past Medical History:  Diagnosis Date  . A-fib (HCC)    a. Post-op afib after CABG 08/2012.  . Acute gastric ulcer with hemorrhage   . Acute respiratory failure with hypoxia (HCC)   . Aphasia following cerebral infarction   . Atrial fibrillation (HCC)   . CAD (coronary artery disease)    a. NSTEMI s/p stent to distal RCA 03/2000. b. Inf MI s/p emergent thrombectomy/stenting mid RCA 10/2000. c. NSTEMI s/p CABGx4 (LIMA-LAD, SVG-OM2, seq SVG-acute marginal and PD) 08/16/12 - course complicated by confusion, post-op AF, L pleural effusion with thoracentesis. d. Normal LV function by echo 08/2012.  . Diabetes mellitus   . Diverticulosis   . Dysphagia following cerebral infarction   . Encephalopathy   . Extended spectrum beta lactamase (ESBL) resistance   . Generalized anxiety disorder   . GERD (gastroesophageal reflux disease)   . Hemiplegia and hemiparesis following cerebral infarction affecting right dominant side (HCC)   . Hiatal hernia   . HLD (hyperlipidemia)   . HTN (hypertension)   . Non-ST elevation (NSTEMI) myocardial infarction (HCC)   . Nontraumatic intracerebral  hemorrhage in hemisphere, subcortical (HCC)   . Peripheral vascular disease (HCC)    a. Carotid dopplers neg 08/13/12. b. Pre-cabg ABIs - R=0.87 suggesting mild dz, L=1.29 possibly falsely elevated due to calcified vessels.  . Pleural effusion    a. L pleural eff after CABG s/p thoracentesis 08/22/12.  . Prostate cancer (HCC)    Status post radiation treatment.  . Prostatic hypertrophy    a. Hx of urinary retention, awaiting TURP.  . Severe protein-calorie malnutrition (HCC)   . Tobacco abuse   . 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 Valvular heart disease    a. Mild  MR by TEE 08/2012.    Past Surgical History:  Procedure Laterality Date  . APPENDECTOMY    . BACK SURGERY    . CORONARY ARTERY BYPASS GRAFT  08/16/2012   Procedure: CORONARY ARTERY BYPASS GRAFTING (CABG);  Surgeon: Steven C Hendrickson, MD;  Location: [facility] OR;  Service: Open Heart Surgery;  Laterality: N/A;  . IR REPLC GASTRO/COLONIC TUBE PERCUT W/FLUORO  02/13/2019  . LEFT HEART CATHETERIZATION WITH CORONARY ANGIOGRAM N/A 08/14/2012   Procedure: LEFT HEART CATHETERIZATION WITH CORONARY ANGIOGRAM;  Surgeon: Peter M Jordan, MD;  Location: [facility] CATH LAB;  Service: Cardiovascular;  Laterality: N/A;    Social History   Socioeconomic History  . Marital status: Married    Spouse name: Not on file  . Number of children: Not on file  . Years of education: Not   on file  . Highest education level: Not on file  Occupational History  . Occupation: retired   Tobacco Use  . Smoking status: Former Smoker    Quit date: 11/12/1972    Years since quitting: 48.3  . Smokeless tobacco: Never Used  Vaping Use  . Vaping Use: Never used  Substance and Sexual Activity  . Alcohol use: No  . Drug use: No  . Sexual activity: Not Currently  Other Topics Concern  . Not on file  Social History Narrative   He is a long term patient of [facility]    Social Determinants of Health   Financial Resource Strain: Not on file  Food  Insecurity: Not on file  Transportation Needs: Not on file  Physical Activity: Not on file  Stress: Not on file  Social Connections: Not on file  Intimate Partner Violence: Not on file   Family History  Problem Relation Age of Onset  . Hypertension Mother       VITAL SIGNS BP 105/60   Pulse 72   Temp 97.6 F (36.4 C)   Ht 5' 11" (1.803 m)   Wt 165 lb 3.2 oz (74.9 kg)   BMI 23.04 kg/m   Outpatient Encounter Medications as of 03/03/2021  Medication Sig  . acetaminophen (TYLENOL) 325 MG tablet Take 650 mg by mouth every 4 (four) hours as needed. per standing order for pain/fever DO NOT EXCEED >3,000 mg in 24 hours  . amLODipine (NORVASC) 5 MG tablet Take 5 mg by mouth daily.   . atorvastatin (LIPITOR) 40 MG tablet Take 40 mg by mouth at bedtime.   . baclofen (LIORESAL) 10 MG tablet Take 1 tablet by mouth 2 (two) times daily. Hold for sedation  . Balsam [place]-Castor Oil (VENELEX) OINT Apply topically. Special Instructions: Apply to sacrum, coccyx and bilateral buttocks qshift for prevention/ redness. Every Shift Day, Evening, Night  . Eyelid Cleansers (OCUSOFT LID SCRUB EX) daily. Cleanse both eye lids  . FLUoxetine (PROZAC) 20 MG/5ML solution Take 10 mg by mouth daily.  . Glucerna (GLUCERNA) LIQD Take 237 mLs by mouth daily.  . hydrocortisone cream 1 % Apply rectally twice daily and as needed for hemorrhoids  . Insulin Glargine (LANTUS SOLOSTAR) 100 UNIT/ML Solostar Pen Inject 20 Units into the skin daily.  . insulin lispro (HUMALOG) 100 UNIT/ML cartridge Inject 10 Units into the skin 3 (three) times daily before meals. Give 10 units SQ with meals if Blood Sugar >150  . lansoprazole (PREVACID SOLUTAB) 30 MG disintegrating tablet Take 30 mg by mouth daily. Give before meals  . loperamide (IMODIUM A-D) 2 MG tablet Take 2 mg by mouth as needed for diarrhea or loose stools.  . Melatonin 1 MG TABS Take 2 tablets by mouth at bedtime.   . NON FORMULARY Diet Change: Regular, thin liquids   . oxybutynin (DITROPAN-XL) 5 MG 24 hr tablet Take 5 mg by mouth 2 (two) times daily.  . polyethylene glycol (MIRALAX / GLYCOLAX) 17 g packet Take 17 g by mouth daily as needed.  . valsartan (DIOVAN) 80 MG tablet Take 80 mg by mouth daily.   No facility-administered encounter medications on file as of 03/03/2021.     SIGNIFICANT DIAGNOSTIC EXAMS   LABS REVIEWED PREVIOUS:    05-07-20: urine for micro-albumin: 413.2 ( on ARB)  05-18-20: wbc 9.2; hgb 14.8; hct 45.8; mcv 92.5 plt 270; glucose 128; bun 32; creat 1.60 ;k+ 4.3; na++ 140; ca 8.6 alk phos 142 albumin 2.5 05-19-20: glucose 109;   bun 34; creat 1.79; k+ 4.0; na++ 140; ca 8.2 alk phos 145; albumin 2.3  05-22-20: glucose 84; bun 26; creat 1.58; k+ 4.7; na++ 140; ca 8.5 05-25-20: glucose 96; bun 23; creat 1.42; k+ 4.0; na++ 139; ca 8.1  07-23-20: hgb a1c 6.2; chol 79; ldl 48; trig 53; hdl 20  10-29-20: glucose 103; bun 39; creat 1.64; k+ 4.0; na++ 140; ca 8.5   TODAY  01-07-21: wbc 7.8; hgb 15.3; hct 47.7; mcv 93.7 plt 180; glucose 101; bun 45; creat 1.85; k+ 4.4; na++ 140; ca 8.6 GFR 37; liver normal albumin 2.8 chol 101; ldl 60; trig 77; hdl 26; hgb a1c 6.5; tsh 1.908 02-15-21: urine culture: multiple species   Review of Systems  Reason unable to perform ROS: expressive aphasia     Physical Exam Constitutional:      General: He is not in acute distress.    Appearance: He is well-developed. He is not diaphoretic.  Neck:     Thyroid: No thyromegaly.  Cardiovascular:     Rate and Rhythm: Normal rate and regular rhythm.     Pulses: Normal pulses.     Heart sounds: Normal heart sounds.     Comments: Hx cabg  Pulmonary:     Effort: Pulmonary effort is normal. No respiratory distress.     Breath sounds: Normal breath sounds.  Abdominal:     General: Bowel sounds are normal. There is no distension.     Palpations: Abdomen is soft.     Tenderness: There is no abdominal tenderness.  Genitourinary:    Comments: Foley penile  tare Musculoskeletal:     Cervical back: Neck supple.     Right lower leg: No edema.     Left lower leg: No edema.     Comments: Right hemiplegia    Lymphadenopathy:     Cervical: No cervical adenopathy.  Skin:    General: Skin is warm and dry.  Neurological:     Mental Status: He is alert. Mental status is at baseline.  Psychiatric:        Mood and Affect: Mood normal.     ASSESSMENT/ PLAN:  TODAY:   1. CKD stage 3 due to type 2 diabetes mellitus: is stable bun 45; creat 1.85 will monitor  2. Chronic urine retention with bladder spasms; will continue ditropan 5 mg twice daily has long term foley  3. Type 2 diabetes mellitus with peripheral vascular disease is stable hgb a1c 6.5 will continue lantus 20 units nightly and novolog 10 units with meals    Is on ARB statin    PREVIOUS   4. Protein calorie malnutrition severe; weight is stable weight is 165 pounds albumin 3.1 will monitor   5. Nontraumatic subcortical hemorrhage of left cerebra hemisphere/hemorrhagic cerebrovascular accident right hemiparesis is stable will continue baclofen 10 mg twice daily for spasticity.    6. Aortic atherosclerosis (ct 01-04-19) will monitor   7. CAD native heart without angina: is status post cabg 2012 will continue diovan 80 mg daily   8. Hypertension associated with type 2 diabetes mellitus: is stable b/p 105/60 will continue noravsc 5 mg daily and diovan 80 mg daily   9. Dyslipidemia associated with type 2 diabetes mellitus: is stable LDL 77 will continue lipitor 40 mg daily  10. Micro-albumin due to type 2 diabetes mellitus: is without change is 413.2 is on ARB  11. Major depression with psychotic features is stable will continue prozac 10 mg daily is off buspar is   off antipsychotics     Ok Edwards NP [facility]  Contact [phone] Monday through Friday 8am- 5pm  After hours call [phone]

## 2021-03-08 NOTE — Progress Notes (Signed)
History of Present Illness: This 75 year old male with history of CVA in 2019 was first seen in this office in 2020.  His last visit was in August of that year.  He has had an indwelling Foley catheter since his stroke.  He is aphasic, does not walk, and in the past has had a feeding tube.  He no longer has that.  He has had an indwelling Foley catheter since that time, apparently changed monthly.  He comes in today with his wife who is the historian.  She states that at times, the catheter has to be changed sooner than every month as it leaks.  He has had a history of bladder spasms according to his old record, and is on oxybutynin 5 mg twice a day.  Despite this, he does leak, and it is felt that maybe his catheter is malfunctioning.  Additionally, he has traumatic hypospadias due to long-term indwelling Foley catheter.  They question whether he should have a suprapubic tube.  Apparently, he was treated for urinary tract infection about 2 to 3 weeks ago as his urine did look different in the bag.  Past Medical History:  Diagnosis Date  . A-fib ([place])    a. Post-op afib after CABG 08/2012.  [name] Acute gastric ulcer with hemorrhage   . Acute respiratory failure with hypoxia ([place])   . Aphasia following cerebral infarction   . Atrial fibrillation ([place])   . CAD (coronary artery disease)    a. NSTEMI s/p stent to distal RCA 03/2000. b. Inf MI s/p emergent thrombectomy/stenting mid RCA 10/2000. c. NSTEMI s/p CABGx4 (LIMA-LAD, SVG-OM2, seq SVG-acute marginal and PD) 08/12/78 - course complicated by confusion, post-op AF, L pleural effusion with thoracentesis. d. Normal LV function by echo 08/2012.  . Diabetes mellitus   . Diverticulosis   . Dysphagia following cerebral infarction   . Encephalopathy   . Extended spectrum beta lactamase (ESBL) resistance   . Generalized anxiety disorder   . GERD (gastroesophageal reflux disease)   . Hemiplegia and hemiparesis following cerebral infarction affecting right  dominant side ([place])   . Hiatal hernia   . HLD (hyperlipidemia)   . HTN (hypertension)   . Non-ST elevation (NSTEMI) myocardial infarction ([place])   . Nontraumatic intracerebral hemorrhage in hemisphere, subcortical ([place])   . Peripheral vascular disease ([place])    a. Carotid dopplers neg 08/13/12. b. Pre-cabg ABIs - R=0.87 suggesting mild dz, L=1.29 possibly falsely elevated due to calcified vessels.  . Pleural effusion    a. L pleural eff after CABG s/p thoracentesis 08/22/12.  . Prostate cancer [facility])    Status post radiation treatment.  . Prostatic hypertrophy    a. Hx of urinary retention, awaiting TURP.  [name] Severe protein-calorie malnutrition ([place])   . Tobacco abuse   . 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 Valvular heart disease    a. Mild  MR by TEE 08/2012.    Past Surgical History:  Procedure Laterality Date  . APPENDECTOMY    . BACK SURGERY    . CORONARY ARTERY BYPASS GRAFT  08/16/2012   Procedure: CORONARY ARTERY BYPASS GRAFTING (CABG);  Surgeon: Melrose Nakayama, MD;  Location: [place];  Service: Open Heart Surgery;  Laterality: N/A;  . IR REPLC GASTRO/COLONIC TUBE PERCUT W/FLUORO  02/13/2019  . LEFT HEART CATHETERIZATION WITH CORONARY ANGIOGRAM N/A 08/14/2012   Procedure: LEFT HEART CATHETERIZATION WITH CORONARY ANGIOGRAM;  Surgeon: Peter M Martinique, MD;  Location: [facility] CATH LAB;  Service: Cardiovascular;  Laterality: N/A;    Home Medications:  Allergies as of 03/09/2021   No Known Allergies     Medication List    Notice   This visit is during an admission. Changes to the med list made in this visit will be reflected in the After Visit Summary of the admission.     Allergies: No Known Allergies  Family History  Problem Relation Age of Onset  . Hypertension Mother     Social History:  reports that he quit smoking about 48 years ago. He has never used smokeless tobacco. He reports that he does not drink alcohol and does not use drugs.  ROS: A complete  review of systems was performed.  All systems are negative except for pertinent findings as noted.  Physical Exam:  Vital signs in last 24 hours: There were no vitals taken for this visit. Constitutional:  Alert, he is aphasic Cardiovascular: Regular rate  Respiratory: Normal respiratory effort Genitourinary: Phallus circumcised.  Foley catheter present.  Traumatic hypospadias at least halfway down the ventral penis  I have reviewed prior pt notes  I have reviewed notes from referring/previous physicians  Nursing home record reviewed  Impression/Assessment:  1.  Status post CVA with probable neurogenic bladder, indwelling Foley catheter long-term  2.  Traumatic hypospadias  3.  Probable bladder spasms that are causing the leakage around the catheter  Plan:  1.  I do not think that the catheter needs to be changed more often than every month, as the leakage I think is mainly due to bladder spasms.  I do not think that upsizing the catheter is good to make this any better.  2.  I would recommend changing his oxybutynin to controlled release 15 mg, 1 p.o. every day  3.  I do not think suprapubic tube is necessary at this point  4.  Continue annual follow-up

## 2021-03-09 ENCOUNTER — Encounter: Payer: Self-pay | Admitting: Urology

## 2021-03-09 ENCOUNTER — Ambulatory Visit (INDEPENDENT_AMBULATORY_CARE_PROVIDER_SITE_OTHER): Payer: Medicare PPO | Admitting: Urology

## 2021-03-09 VITALS — BP 134/79 | HR 68 | Temp 98.0°F | Ht 69.0 in | Wt 157.0 lb

## 2021-03-09 DIAGNOSIS — N3289 Other specified disorders of bladder: Secondary | ICD-10-CM | POA: Diagnosis not present

## 2021-03-09 DIAGNOSIS — N401 Enlarged prostate with lower urinary tract symptoms: Secondary | ICD-10-CM | POA: Diagnosis not present

## 2021-03-09 DIAGNOSIS — R339 Retention of urine, unspecified: Secondary | ICD-10-CM | POA: Diagnosis not present

## 2021-03-09 DIAGNOSIS — Q549 Hypospadias, unspecified: Secondary | ICD-10-CM | POA: Diagnosis not present

## 2021-03-09 NOTE — Progress Notes (Signed)

## 2021-03-25 ENCOUNTER — Other Ambulatory Visit (HOSPITAL_COMMUNITY)
Admission: RE | Admit: 2021-03-25 | Discharge: 2021-03-25 | Disposition: A | Discharge status: Home / Self Care | Payer: Medicare PPO | Source: Skilled Nursing Facility | Attending: Adult Health | Admitting: Adult Health

## 2021-03-25 DIAGNOSIS — E1165 Type 2 diabetes mellitus with hyperglycemia: Secondary | ICD-10-CM | POA: Diagnosis not present

## 2021-03-25 LAB — HEMOGLOBIN A1C
Hgb A1c MFr Bld: 6.6 % — ABNORMAL HIGH (ref 4.8–5.6)
Mean Plasma Glucose: 142.72 mg/dL

## 2021-04-06 DIAGNOSIS — M2042 Other hammer toe(s) (acquired), left foot: Secondary | ICD-10-CM | POA: Diagnosis not present

## 2021-04-06 DIAGNOSIS — E1151 Type 2 diabetes mellitus with diabetic peripheral angiopathy without gangrene: Secondary | ICD-10-CM | POA: Diagnosis not present

## 2021-04-06 DIAGNOSIS — B351 Tinea unguium: Secondary | ICD-10-CM | POA: Diagnosis not present

## 2021-04-06 DIAGNOSIS — Z794 Long term (current) use of insulin: Secondary | ICD-10-CM | POA: Diagnosis not present

## 2021-04-09 ENCOUNTER — Non-Acute Institutional Stay (SKILLED_NURSING_FACILITY): Payer: Medicare PPO | Admitting: Adult Health

## 2021-04-09 ENCOUNTER — Encounter: Payer: Self-pay | Admitting: Adult Health

## 2021-04-09 DIAGNOSIS — G8111 Spastic hemiplegia affecting right dominant side: Secondary | ICD-10-CM | POA: Diagnosis not present

## 2021-04-09 DIAGNOSIS — I700 Atherosclerosis of aorta: Secondary | ICD-10-CM | POA: Diagnosis not present

## 2021-04-09 DIAGNOSIS — I610 Nontraumatic intracerebral hemorrhage in hemisphere, subcortical: Secondary | ICD-10-CM

## 2021-04-09 DIAGNOSIS — E43 Unspecified severe protein-calorie malnutrition: Secondary | ICD-10-CM

## 2021-04-09 NOTE — Progress Notes (Signed)
Location:  [facility] Room Number: 130/P Place of Service:  SNF (31)   CODE STATUS: Full Code  No Known Allergies  Chief Complaint  Patient presents with  . Medical Management of Chronic Issues         Protein calorie malnutrition severe:    Nontraumatic subcortical hemorrhage of left cerebral hemisphere/right spastic hemiplegia :  Aortic atherosclerosis:     HPI:  He is a 75 year old long term resident of this facility being seen for the management of his chronic illnesses: Protein calorie malnutrition severe:    Nontraumatic subcortical hemorrhage of left cerebral hemisphere/right spastic hemiplegia   Aortic atherosclerosis. There are no reports of pain; his appetite is good; he remains on thin liquids. There are no reports of anxiety; does get out of bed daily   Past Medical History:  Diagnosis Date  . A-fib ([place])    a. Post-op afib after CABG 08/2012.  [name] Acute gastric ulcer with hemorrhage   . Acute respiratory failure with hypoxia ([place])   . Aphasia following cerebral infarction   . Atrial fibrillation ([place])   . CAD (coronary artery disease)    a. NSTEMI s/p stent to distal RCA 03/2000. b. Inf MI s/p emergent thrombectomy/stenting mid RCA 10/2000. c. NSTEMI s/p CABGx4 (LIMA-LAD, SVG-OM2, seq SVG-acute marginal and PD) 02/11/81 - course complicated by confusion, post-op AF, L pleural effusion with thoracentesis. d. Normal LV function by echo 08/2012.  . Diabetes mellitus   . Diverticulosis   . Dysphagia following cerebral infarction   . Encephalopathy   . Extended spectrum beta lactamase (ESBL) resistance   . Generalized anxiety disorder   . GERD (gastroesophageal reflux disease)   . Hemiplegia and hemiparesis following cerebral infarction affecting right dominant side ([place])   . Hiatal hernia   . HLD (hyperlipidemia)   . HTN (hypertension)   . Non-ST elevation (NSTEMI) myocardial infarction ([place])   . Nontraumatic intracerebral hemorrhage in hemisphere,  subcortical ([place])   . Peripheral vascular disease ([place])    a. Carotid dopplers neg 08/13/12. b. Pre-cabg ABIs - R=0.87 suggesting mild dz, L=1.29 possibly falsely elevated due to calcified vessels.  . Pleural effusion    a. L pleural eff after CABG s/p thoracentesis 08/22/12.  . Prostate cancer [facility])    Status post radiation treatment.  . Prostatic hypertrophy    a. Hx of urinary retention, awaiting TURP.  [name] Severe protein-calorie malnutrition ([place])   . Tobacco abuse   . 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 Valvular heart disease    a. Mild  MR by TEE 08/2012.    Past Surgical History:  Procedure Laterality Date  . APPENDECTOMY    . BACK SURGERY    . CORONARY ARTERY BYPASS GRAFT  08/16/2012   Procedure: CORONARY ARTERY BYPASS GRAFTING (CABG);  Surgeon: Melrose Nakayama, MD;  Location: [place];  Service: Open Heart Surgery;  Laterality: N/A;  . IR REPLC GASTRO/COLONIC TUBE PERCUT W/FLUORO  02/13/2019  . LEFT HEART CATHETERIZATION WITH CORONARY ANGIOGRAM N/A 08/14/2012   Procedure: LEFT HEART CATHETERIZATION WITH CORONARY ANGIOGRAM;  Surgeon: Peter M Martinique, MD;  Location: [facility] CATH LAB;  Service: Cardiovascular;  Laterality: N/A;    Social History   Socioeconomic History  . Marital status: Married    Spouse name: Not on file  . Number of children: Not on file  . Years of education: Not on file  . Highest education level: Not on file  Occupational History  .  Occupation: retired   Tobacco Use  . Smoking status: Former Smoker    Quit date: 11/12/1972    Years since quitting: 48.4  . Smokeless tobacco: Never Used  Vaping Use  . Vaping Use: Never used  Substance and Sexual Activity  . Alcohol use: No  . Drug use: No  . Sexual activity: Not Currently  Other Topics Concern  . Not on file  Social History Narrative   He is a long term patient of [facility]    Social Determinants of Health   Financial Resource Strain: Not on file  Food Insecurity: Not on file   Transportation Needs: Not on file  Physical Activity: Not on file  Stress: Not on file  Social Connections: Not on file  Intimate Partner Violence: Not on file   Family History  Problem Relation Age of Onset  . Hypertension Mother       VITAL SIGNS BP 129/63   Pulse 61   Temp 97.6 F (36.4 C)   Resp 20   Ht 5' 11"  (1.803 m)   Wt 171 lb 3.2 oz (77.7 kg)   BMI 23.88 kg/m   Outpatient Encounter Medications as of 04/09/2021  Medication Sig  . acetaminophen (TYLENOL) 325 MG tablet Take 650 mg by mouth every 4 (four) hours as needed. per standing order for pain/fever DO NOT EXCEED >3,000 mg in 24 hours  . amLODipine (NORVASC) 5 MG tablet Take 5 mg by mouth daily.   [name] atorvastatin (LIPITOR) 40 MG tablet Take 40 mg by mouth at bedtime.   . baclofen (LIORESAL) 10 MG tablet Take 1 tablet by mouth 2 (two) times daily. Hold for sedation  . Balsam [place]-Castor Oil (VENELEX) OINT Apply topically. Special Instructions: Apply to sacrum, coccyx and bilateral buttocks qshift for prevention/ redness. Every Shift Day, Evening, Night  . Eyelid Cleansers (OCUSOFT LID SCRUB EX) daily. Cleanse both eye lids  . FLUoxetine (PROZAC) 20 MG/5ML solution Take 10 mg by mouth daily.  . Glucerna (GLUCERNA) LIQD Take 237 mLs by mouth daily.  . hydrocortisone cream 1 % Apply rectally twice daily and as needed for hemorrhoids  . Insulin Glargine (LANTUS SOLOSTAR) 100 UNIT/ML Solostar Pen Inject 20 Units into the skin daily.  . insulin lispro (HUMALOG) 100 UNIT/ML cartridge Inject 10 Units into the skin 3 (three) times daily before meals. Give 10 units SQ with meals if Blood Sugar >150  . lansoprazole (PREVACID SOLUTAB) 30 MG disintegrating tablet Take 30 mg by mouth daily. Give before meals  . loperamide (IMODIUM A-D) 2 MG tablet Take 2 mg by mouth as needed for diarrhea or loose stools.  . Melatonin 1 MG TABS Take 2 tablets by mouth at bedtime.   [name] oxybutynin (DITROPAN XL) 15 MG 24 hr tablet Take 15 mg by  mouth at bedtime.  . polyethylene glycol (MIRALAX / GLYCOLAX) 17 g packet Take 17 g by mouth daily as needed.  . valsartan (DIOVAN) 80 MG tablet Take 80 mg by mouth daily.  . NON FORMULARY Diet Change: Regular, thin liquids   No facility-administered encounter medications on file as of 04/09/2021.     SIGNIFICANT DIAGNOSTIC EXAMS  LABS REVIEWED PREVIOUS:    05-07-20: urine for micro-albumin: 413.2 ( on ARB)  05-18-20: wbc 9.2; hgb 14.8; hct 45.8; mcv 92.5 plt 270; glucose 128; bun 32; creat 1.60 ;k+ 4.3; na++ 140; ca 8.6 alk phos 142 albumin 2.5 05-19-20: glucose 109; bun 34; creat 1.79; k+ 4.0; na++ 140; ca 8.2 alk phos 145;  albumin 2.3  05-22-20: glucose 84; bun 26; creat 1.58; k+ 4.7; na++ 140; ca 8.5 05-25-20: glucose 96; bun 23; creat 1.42; k+ 4.0; na++ 139; ca 8.1  07-23-20: hgb a1c 6.2; chol 79; ldl 48; trig 53; hdl 20  10-29-20: glucose 103; bun 39; creat 1.64; k+ 4.0; na++ 140; ca 8.5  01-07-21: wbc 7.8; hgb 15.3; hct 47.7; mcv 93.7 plt 180; glucose 101; bun 45; creat 1.85; k+ 4.4; na++ 140; ca 8.6 GFR 37; liver normal albumin 2.8 chol 101; ldl 60; trig 77; hdl 26; hgb a1c 6.5; tsh 1.908 02-15-21: urine culture: multiple species   TODAY  03-25-21 hgb a1c 6.6   Review of Systems  Reason unable to perform ROS: expressive aphasia     Physical Exam Constitutional:      General: He is not in acute distress.    Appearance: He is well-developed. He is not diaphoretic.  Neck:     Thyroid: No thyromegaly.  Cardiovascular:     Rate and Rhythm: Normal rate and regular rhythm.     Heart sounds: Normal heart sounds.     Comments: Hx cabg  Pulmonary:     Effort: Pulmonary effort is normal. No respiratory distress.     Breath sounds: Normal breath sounds.  Abdominal:     General: Bowel sounds are normal. There is no distension.     Palpations: Abdomen is soft.     Tenderness: There is no abdominal tenderness.  Genitourinary:    Comments: Foley penile tare Musculoskeletal:     Cervical  back: Neck supple.     Right lower leg: No edema.     Left lower leg: No edema.     Comments: Right hemiplegia     Lymphadenopathy:     Cervical: No cervical adenopathy.  Skin:    General: Skin is warm and dry.  Neurological:     Mental Status: He is alert. Mental status is at baseline.  Psychiatric:        Mood and Affect: Mood normal.    ASSESSMENT/ PLAN:  TODAY:   1. Protein calorie malnutrition severe: is stable weight is 171 pounds albumin 2.2 will continue  Supplements as indicated   2. Nontraumatic subcortical hemorrhage of left cerebral hemisphere/right spastic hemiplegia will continue baclofen 10 mg twice daily for spasticity   3. Aortic atherosclerosis: (ct1-24-20) will monitor   PREVIOUS   4. CAD native heart without angina: is status post cabg 2012 will continue diovan 80 mg daily   5. Hypertension associated with type 2 diabetes mellitus: is stable b/p 129/63 will continue noravsc 5 mg daily and diovan 80 mg daily   6. Dyslipidemia associated with type 2 diabetes mellitus: is stable LDL 77 will continue lipitor 40 mg daily  7. Micro-albumin due to type 2 diabetes mellitus: is without change is 413.2 is on ARB  8. Major depression with psychotic features is stable will continue prozac 10 mg daily is off buspar is off antipsychotics   9. CKD stage 3 due to type 2 diabetes mellitus: is stable bun 45; creat 1.85 will monitor  10. Chronic urine retention with bladder spasms; will continue ditropan 5 mg twice daily has long term foley  11. Type 2 diabetes mellitus with peripheral vascular disease is stable hgb a1c 6.6 will continue lantus 20 units nightly and novolog 10 units with meals    Is on ARB statin     Ok Edwards NP [facility]  Contact [phone] Monday through Friday 8am-  5pm  After hours call [phone]

## 2021-05-04 DIAGNOSIS — F333 Major depressive disorder, recurrent, severe with psychotic symptoms: Secondary | ICD-10-CM | POA: Diagnosis not present

## 2021-05-04 DIAGNOSIS — F5105 Insomnia due to other mental disorder: Secondary | ICD-10-CM | POA: Diagnosis not present

## 2021-05-06 ENCOUNTER — Encounter: Payer: Self-pay | Admitting: Adult Health

## 2021-05-06 ENCOUNTER — Non-Acute Institutional Stay (SKILLED_NURSING_FACILITY): Payer: Medicare PPO | Admitting: Adult Health

## 2021-05-06 DIAGNOSIS — E1151 Type 2 diabetes mellitus with diabetic peripheral angiopathy without gangrene: Secondary | ICD-10-CM

## 2021-05-06 DIAGNOSIS — E1129 Type 2 diabetes mellitus with other diabetic kidney complication: Secondary | ICD-10-CM

## 2021-05-06 DIAGNOSIS — R4701 Aphasia: Secondary | ICD-10-CM

## 2021-05-06 DIAGNOSIS — G8111 Spastic hemiplegia affecting right dominant side: Secondary | ICD-10-CM | POA: Diagnosis not present

## 2021-05-06 DIAGNOSIS — E1159 Type 2 diabetes mellitus with other circulatory complications: Secondary | ICD-10-CM

## 2021-05-06 DIAGNOSIS — I610 Nontraumatic intracerebral hemorrhage in hemisphere, subcortical: Secondary | ICD-10-CM

## 2021-05-06 DIAGNOSIS — Z978 Presence of other specified devices: Secondary | ICD-10-CM

## 2021-05-06 DIAGNOSIS — I2510 Atherosclerotic heart disease of native coronary artery without angina pectoris: Secondary | ICD-10-CM

## 2021-05-06 DIAGNOSIS — E1122 Type 2 diabetes mellitus with diabetic chronic kidney disease: Secondary | ICD-10-CM | POA: Diagnosis not present

## 2021-05-06 DIAGNOSIS — E785 Hyperlipidemia, unspecified: Secondary | ICD-10-CM

## 2021-05-06 DIAGNOSIS — F323 Major depressive disorder, single episode, severe with psychotic features: Secondary | ICD-10-CM

## 2021-05-06 DIAGNOSIS — E43 Unspecified severe protein-calorie malnutrition: Secondary | ICD-10-CM

## 2021-05-06 DIAGNOSIS — R339 Retention of urine, unspecified: Secondary | ICD-10-CM

## 2021-05-06 DIAGNOSIS — I700 Atherosclerosis of aorta: Secondary | ICD-10-CM | POA: Diagnosis not present

## 2021-05-06 DIAGNOSIS — N183 Chronic kidney disease, stage 3 unspecified: Secondary | ICD-10-CM

## 2021-05-06 DIAGNOSIS — N3289 Other specified disorders of bladder: Secondary | ICD-10-CM

## 2021-05-06 DIAGNOSIS — I152 Hypertension secondary to endocrine disorders: Secondary | ICD-10-CM

## 2021-05-06 DIAGNOSIS — E1169 Type 2 diabetes mellitus with other specified complication: Secondary | ICD-10-CM | POA: Diagnosis not present

## 2021-05-06 DIAGNOSIS — R809 Proteinuria, unspecified: Secondary | ICD-10-CM

## 2021-05-06 NOTE — Progress Notes (Signed)
Provider: Ok Edwards, NP Location: [place]  PCP: Tanner Fee, NP  Extended Emergency Contact Information Primary Emergency Contact: [name] Address: [address]          [place] [address] [name] of [place] Phone: [phone] Mobile Phone: [phone] Relation: Spouse Secondary Emergency Contact: [name]  [place] Phone: [phone] Relation: Son  Codes status: full code  Goals of care: advanced directive information Advanced Directives 05/06/2021  Does Patient Have a Medical Advance Directive? Yes  Type of Advance Directive Out of facility DNR (pink MOST or yellow form)  Does patient want to make changes to medical advance directive? No - Patient declined  Would patient like information on creating a medical advance directive? -  Pre-existing out of facility DNR order (yellow form or pink MOST form) Pink MOST/Yellow Form most recent copy in chart - Physician notified to receive inpatient order     No Known Allergies  Chief Complaint  Patient presents with  . Annual Exam    Annual.  .         HPI  He is a 75 year old long term resident of this facility being seen for his annual exam. He has not required any hospitalizations or visits to the ED over the past year. There have been no recent falls. His weight is stable. Overall his status remains stable. There are no reports of pain; no reports of changes in appetite; no reports of anxiety. His appetite remains good. He continue to be followed for his chronic illnesses including: CAD native heart without angina   Hypertension associated with type 2 diabetes mellitus:    Dyslipidemia associated with type 2 diabetes mellitus:  Micro-albumin due to type 2 diabetes mellitus:   Past Medical History:  Diagnosis Date  . A-fib ([place])    a. Post-op afib after CABG 08/2012.  [name] Acute gastric ulcer with hemorrhage   . Acute respiratory failure with hypoxia ([place])   .  Aphasia following cerebral infarction   . Atrial fibrillation ([place])   . CAD (coronary artery disease)    a. NSTEMI s/p stent to distal RCA 03/2000. [name]. Inf MI s/p emergent thrombectomy/stenting mid RCA 10/2000. c. NSTEMI s/p CABGx4 (LIMA-LAD, SVG-OM2, seq SVG-acute marginal and PD) 05/14/00 - course complicated by confusion, post-op AF, L pleural effusion with thoracentesis. d. Normal LV function by echo 08/2012.  . Diabetes mellitus   . Diverticulosis   . Dysphagia following cerebral infarction   . Encephalopathy   . Extended spectrum beta lactamase (ESBL) resistance   . Generalized anxiety disorder   . GERD (gastroesophageal reflux disease)   . Hemiplegia and hemiparesis following cerebral infarction affecting right dominant side ([place])   . Hiatal hernia   . HLD (hyperlipidemia)   . HTN (hypertension)   . Non-ST elevation (NSTEMI) myocardial infarction ([place])   . Nontraumatic intracerebral hemorrhage in hemisphere, subcortical ([place])   . Peripheral vascular disease ([place])    a. Carotid dopplers neg 08/13/12. [name]. Pre-cabg ABIs - R=0.87 suggesting mild dz, L=1.29 possibly falsely elevated due to calcified vessels.  . Pleural effusion    a. L pleural eff after CABG s/p thoracentesis 08/22/12.  . Prostate cancer [facility])    Status post radiation treatment.  . Prostatic hypertrophy    a. Hx of urinary retention, awaiting TURP.  [name] Severe protein-calorie malnutrition ([place])   . Tobacco abuse   . 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 Valvular heart  disease    a. Mild  MR by TEE 08/2012.   Past Surgical History:  Procedure Laterality Date  . APPENDECTOMY    . BACK SURGERY    . CORONARY ARTERY BYPASS GRAFT  08/16/2012   Procedure: CORONARY ARTERY BYPASS GRAFTING (CABG);  Surgeon: Melrose Nakayama, MD;  Location: [place];  Service: Open Heart Surgery;  Laterality: N/A;  . IR REPLC GASTRO/COLONIC TUBE PERCUT W/FLUORO  02/13/2019  . LEFT HEART CATHETERIZATION WITH CORONARY ANGIOGRAM N/A  08/14/2012   Procedure: LEFT HEART CATHETERIZATION WITH CORONARY ANGIOGRAM;  Surgeon: Peter M Martinique, MD;  Location: [facility] CATH LAB;  Service: Cardiovascular;  Laterality: N/A;    reports that he quit smoking about 48 years ago. He has never used smokeless tobacco. He reports that he does not drink alcohol and does not use drugs. Social History   Tobacco Use  . Smoking status: Former Smoker    Quit date: 11/12/1972    Years since quitting: 48.5  . Smokeless tobacco: Never Used  Vaping Use  . Vaping Use: Never used  Substance Use Topics  . Alcohol use: No  . Drug use: No   Family History  Problem Relation Age of Onset  . Hypertension Mother     Pertinent  Health Maintenance Due  Topic Date Due  . URINE MICROALBUMIN  05/07/2021  . INFLUENZA VACCINE  07/12/2021  . OPHTHALMOLOGY EXAM  07/15/2021  . FOOT EXAM  08/07/2021  . HEMOGLOBIN A1C  09/24/2021  . PNA vac Low Risk Adult  Completed   Fall Risk  11/02/2020 10/30/2019 10/30/2019  Falls in the past year? 1 1 -  Number falls in past yr: 1 1 -  Injury with Fall? 0 0 0  Risk for fall due to : History of fall(s);Impaired balance/gait;Impaired mobility History of fall(s);Impaired mobility History of fall(s)  Follow up Falls evaluation completed - -   No flowsheet data found.      Outpatient Encounter Medications as of 05/06/2021  Medication Sig  . acetaminophen (TYLENOL) 325 MG tablet Take 650 mg by mouth every 4 (four) hours as needed. per standing order for pain/fever DO NOT EXCEED >3,000 mg in 24 hours  . amLODipine (NORVASC) 5 MG tablet Take 5 mg by mouth daily.   [name] atorvastatin (LIPITOR) 40 MG tablet Take 40 mg by mouth at bedtime.   . baclofen (LIORESAL) 10 MG tablet Take 1 tablet by mouth 2 (two) times daily. Hold for sedation  . Balsam [place]-Castor Oil (VENELEX) OINT Apply topically. Special Instructions: Apply to sacrum, coccyx and bilateral buttocks qshift for prevention/ redness. Every Shift Day, Evening, Night  .  Eyelid Cleansers (OCUSOFT LID SCRUB EX) daily. Cleanse both eye lids  . FLUoxetine (PROZAC) 20 MG/5ML solution Take 10 mg by mouth daily.  . Glucerna (GLUCERNA) LIQD Take 237 mLs by mouth daily.  . hydrocortisone cream 1 % Apply rectally twice daily and as needed for hemorrhoids  . Insulin Glargine (LANTUS SOLOSTAR) 100 UNIT/ML Solostar Pen Inject 20 Units into the skin daily.  . insulin lispro (HUMALOG) 100 UNIT/ML cartridge Inject 10 Units into the skin 3 (three) times daily before meals. Give 10 units SQ with meals if Blood Sugar >150  . lansoprazole (PREVACID SOLUTAB) 30 MG disintegrating tablet Take 30 mg by mouth daily. Give before meals  . loperamide (IMODIUM A-D) 2 MG tablet Take 2 mg by mouth as needed for diarrhea or loose stools.  . Melatonin 1 MG TABS Take 2 tablets by mouth at bedtime.   [name]  NON FORMULARY Diet Change: Regular, thin liquids  . oxybutynin (DITROPAN XL) 15 MG 24 hr tablet Take 15 mg by mouth at bedtime.  . polyethylene glycol (MIRALAX / GLYCOLAX) 17 g packet Take 17 g by mouth daily as needed.  . valsartan (DIOVAN) 80 MG tablet Take 80 mg by mouth daily.  . [DISCONTINUED] NOVOFINE AUTOCOVER PEN NEEDLE 30G X 8 MM MISC  (Patient not taking: No sig reported)   No facility-administered encounter medications on file as of 05/06/2021.     Vitals:   05/06/21 1104  BP: 116/74  Pulse: 74  Resp: 20  Temp: 98.1 F (36.7 C)  SpO2: 100%  Weight: 172 lb (78 kg)  Height: 5' 11" (1.803 m)   Body mass index is 23.99 kg/m.  DIAGNOSTIC EXAMS   LABS REVIEWED PREVIOUS:    05-07-20: urine for micro-albumin: 413.2 ( on ARB)  05-18-20: wbc 9.2; hgb 14.8; hct 45.8; mcv 92.5 plt 270; glucose 128; bun 32; creat 1.60 ;k+ 4.3; na++ 140; ca 8.6 alk phos 142 albumin 2.5 05-19-20: glucose 109; bun 34; creat 1.79; k+ 4.0; na++ 140; ca 8.2 alk phos 145; albumin 2.3  05-22-20: glucose 84; bun 26; creat 1.58; k+ 4.7; na++ 140; ca 8.5 05-25-20: glucose 96; bun 23; creat 1.42; k+ 4.0; na++ 139;  ca 8.1  07-23-20: hgb a1c 6.2; chol 79; ldl 48; trig 53; hdl 20  10-29-20: glucose 103; bun 39; creat 1.64; k+ 4.0; na++ 140; ca 8.5  01-07-21: wbc 7.8; hgb 15.3; hct 47.7; mcv 93.7 plt 180; glucose 101; bun 45; creat 1.85; k+ 4.4; na++ 140; ca 8.6 GFR 37; liver normal albumin 2.8 chol 101; ldl 60; trig 77; hdl 26; hgb a1c 6.5; tsh 1.908 02-15-21: urine culture: multiple species  03-25-21 hgb a1c 6.6  NO NEW LABS.    Review of Systems  Reason unable to perform ROS: expressive aphasia     Physical Exam Constitutional:      General: He is not in acute distress.    Appearance: He is well-developed. He is not diaphoretic.  HENT:     Right Ear: Ear canal normal.     Left Ear: Ear canal normal.     Nose: Nose normal.     Mouth/Throat:     Mouth: Mucous membranes are moist.     Pharynx: Oropharynx is clear.  Eyes:     Conjunctiva/sclera: Conjunctivae normal.  Neck:     Thyroid: No thyromegaly.  Cardiovascular:     Rate and Rhythm: Normal rate and regular rhythm.     Pulses: Normal pulses.     Heart sounds: Normal heart sounds.     Comments: Hx cabg Pulmonary:     Effort: Pulmonary effort is normal. No respiratory distress.     Breath sounds: Normal breath sounds.  Abdominal:     General: Bowel sounds are normal. There is no distension.     Palpations: Abdomen is soft.     Tenderness: There is no abdominal tenderness.  Genitourinary:    Comments: Foley penile tare Musculoskeletal:     Cervical back: Neck supple.     Right lower leg: No edema.     Left lower leg: No edema.     Comments: Right hemiplegia   Lymphadenopathy:     Cervical: No cervical adenopathy.  Skin:    General: Skin is warm and dry.  Neurological:     Mental Status: He is alert. Mental status is at baseline.  Psychiatric:  Mood and Affect: Mood normal.      ASSESSMENT/ PLAN:  TODAY:   1. CAD native heart without angina is status post cabg 2012 will continue diovan 80 mg daily   2. Hypertension  associated with type 2 diabetes mellitus:    is stable [name]/p 116/74 will continue diovan 80 mg daily norvasc 5 mg daily   3. Dyslipidemia associated with type 2 diabetes mellitus: is stable LDL 77 will continue lipitor 40 mg daily   4. Micro-albumin due to type 2 diabetes mellitus: is stable 413.2 is on ARB  5. Major depression with psychotic features: is stable will continue prozac 10 mg daily is off buspar and antipsychotics  6. CKD stage 3 due to type 2 diabetes mellitus: is stable bun 45; creat 1.85 will monitor  7. Chronic urine retention with bladder spasms will continue ditropan 5 mg twice daily has long term foley  8. Type 2 diabetes mellitus with peripheral vascular disease: is stable hgb a1c 6.6 will continue lantus 20 units nightly and novolog 10 units with meals is on ARB and stating  9. Protein calorie malnutrition severe: is stable weight is 172 pounds; albumin 2.2 will continue supplements as indicated   10. Nontraumatic subcortical hemorrhage of left cerebral hemisphere/right spastic hemiplegia: is stable will continue baclofen 10 mg twice daily for spasticity  11. Aortic atherosclerosis: (ct 01-04-19) will monitor   Will check urine for microalbumin   Tanner Edwards NP [facility]  Contact [phone] Monday through Friday 8am- 5pm  After hours call [phone]

## 2021-05-11 ENCOUNTER — Other Ambulatory Visit (HOSPITAL_COMMUNITY)
Admission: RE | Admit: 2021-05-11 | Discharge: 2021-05-11 | Disposition: A | Discharge status: Home / Self Care | Payer: Medicare PPO | Source: Skilled Nursing Facility | Attending: Adult Health | Admitting: Adult Health

## 2021-05-11 DIAGNOSIS — E1165 Type 2 diabetes mellitus with hyperglycemia: Secondary | ICD-10-CM | POA: Insufficient documentation

## 2021-05-12 ENCOUNTER — Encounter: Payer: Self-pay | Admitting: Adult Health

## 2021-05-12 ENCOUNTER — Non-Acute Institutional Stay (SKILLED_NURSING_FACILITY): Payer: Medicare PPO | Admitting: Adult Health

## 2021-05-12 ENCOUNTER — Other Ambulatory Visit: Payer: Self-pay

## 2021-05-12 ENCOUNTER — Observation Stay (HOSPITAL_COMMUNITY)
Admission: EM | Admit: 2021-05-12 | Discharge: 2021-05-15 | Disposition: A | Discharge status: Home / Self Care | Payer: Medicare PPO | Attending: Internal Medicine | Admitting: Internal Medicine

## 2021-05-12 ENCOUNTER — Emergency Department (HOSPITAL_COMMUNITY): Payer: Medicare PPO

## 2021-05-12 DIAGNOSIS — Z8546 Personal history of malignant neoplasm of prostate: Secondary | ICD-10-CM | POA: Diagnosis not present

## 2021-05-12 DIAGNOSIS — I2510 Atherosclerotic heart disease of native coronary artery without angina pectoris: Secondary | ICD-10-CM | POA: Diagnosis present

## 2021-05-12 DIAGNOSIS — I82401 Acute embolism and thrombosis of unspecified deep veins of right lower extremity: Principal | ICD-10-CM | POA: Diagnosis present

## 2021-05-12 DIAGNOSIS — Z20822 Contact with and (suspected) exposure to covid-19: Secondary | ICD-10-CM | POA: Insufficient documentation

## 2021-05-12 DIAGNOSIS — I82431 Acute embolism and thrombosis of right popliteal vein: Secondary | ICD-10-CM | POA: Diagnosis not present

## 2021-05-12 DIAGNOSIS — Z87891 Personal history of nicotine dependence: Secondary | ICD-10-CM | POA: Diagnosis not present

## 2021-05-12 DIAGNOSIS — N1832 Chronic kidney disease, stage 3b: Secondary | ICD-10-CM | POA: Insufficient documentation

## 2021-05-12 DIAGNOSIS — I739 Peripheral vascular disease, unspecified: Secondary | ICD-10-CM | POA: Diagnosis present

## 2021-05-12 DIAGNOSIS — Z951 Presence of aortocoronary bypass graft: Secondary | ICD-10-CM | POA: Insufficient documentation

## 2021-05-12 DIAGNOSIS — I824Y1 Acute embolism and thrombosis of unspecified deep veins of right proximal lower extremity: Secondary | ICD-10-CM

## 2021-05-12 DIAGNOSIS — E1122 Type 2 diabetes mellitus with diabetic chronic kidney disease: Secondary | ICD-10-CM | POA: Diagnosis not present

## 2021-05-12 DIAGNOSIS — I129 Hypertensive chronic kidney disease with stage 1 through stage 4 chronic kidney disease, or unspecified chronic kidney disease: Secondary | ICD-10-CM | POA: Diagnosis not present

## 2021-05-12 DIAGNOSIS — I82491 Acute embolism and thrombosis of other specified deep vein of right lower extremity: Secondary | ICD-10-CM | POA: Diagnosis not present

## 2021-05-12 DIAGNOSIS — Z794 Long term (current) use of insulin: Secondary | ICD-10-CM | POA: Diagnosis not present

## 2021-05-12 DIAGNOSIS — I4891 Unspecified atrial fibrillation: Secondary | ICD-10-CM | POA: Diagnosis not present

## 2021-05-12 DIAGNOSIS — R404 Transient alteration of awareness: Secondary | ICD-10-CM | POA: Diagnosis not present

## 2021-05-12 DIAGNOSIS — I10 Essential (primary) hypertension: Secondary | ICD-10-CM | POA: Diagnosis present

## 2021-05-12 DIAGNOSIS — R2241 Localized swelling, mass and lump, right lower limb: Secondary | ICD-10-CM | POA: Diagnosis present

## 2021-05-12 DIAGNOSIS — I82411 Acute embolism and thrombosis of right femoral vein: Secondary | ICD-10-CM | POA: Diagnosis not present

## 2021-05-12 DIAGNOSIS — Z79899 Other long term (current) drug therapy: Secondary | ICD-10-CM | POA: Diagnosis not present

## 2021-05-12 DIAGNOSIS — I824Z1 Acute embolism and thrombosis of unspecified deep veins of right distal lower extremity: Secondary | ICD-10-CM | POA: Diagnosis not present

## 2021-05-12 DIAGNOSIS — R2243 Localized swelling, mass and lump, lower limb, bilateral: Secondary | ICD-10-CM | POA: Diagnosis not present

## 2021-05-12 LAB — CBC WITH DIFFERENTIAL/PLATELET
Abs Immature Granulocytes: 0.03 10*3/uL (ref 0.00–0.07)
Basophils Absolute: 0.1 10*3/uL (ref 0.0–0.1)
Basophils Relative: 1 %
Eosinophils Absolute: 0.4 10*3/uL (ref 0.0–0.5)
Eosinophils Relative: 4 %
HCT: 47.9 % (ref 39.0–52.0)
Hemoglobin: 15.2 g/dL (ref 13.0–17.0)
Immature Granulocytes: 0 %
Lymphocytes Relative: 19 %
Lymphs Abs: 2 10*3/uL (ref 0.7–4.0)
MCH: 30.2 pg (ref 26.0–34.0)
MCHC: 31.7 g/dL (ref 30.0–36.0)
MCV: 95 fL (ref 80.0–100.0)
Monocytes Absolute: 0.8 10*3/uL (ref 0.1–1.0)
Monocytes Relative: 8 %
Neutro Abs: 7.2 10*3/uL (ref 1.7–7.7)
Neutrophils Relative %: 68 %
Platelets: 181 10*3/uL (ref 150–400)
RBC: 5.04 MIL/uL (ref 4.22–5.81)
RDW: 12.7 % (ref 11.5–15.5)
WBC: 10.6 10*3/uL — ABNORMAL HIGH (ref 4.0–10.5)
nRBC: 0 % (ref 0.0–0.2)

## 2021-05-12 LAB — APTT: aPTT: 32 seconds (ref 24–36)

## 2021-05-12 LAB — BASIC METABOLIC PANEL
Anion gap: 9 (ref 5–15)
BUN: 49 mg/dL — ABNORMAL HIGH (ref 8–23)
CO2: 25 mmol/L (ref 22–32)
Calcium: 8.2 mg/dL — ABNORMAL LOW (ref 8.9–10.3)
Chloride: 103 mmol/L (ref 98–111)
Creatinine, Ser: 1.98 mg/dL — ABNORMAL HIGH (ref 0.61–1.24)
GFR, Estimated: 34 mL/min — ABNORMAL LOW (ref >60)
Glucose, Bld: 233 mg/dL — ABNORMAL HIGH (ref 70–99)
Potassium: 4.4 mmol/L (ref 3.5–5.1)
Sodium: 137 mmol/L (ref 135–145)

## 2021-05-12 LAB — PROTIME-INR
INR: 1.1 (ref 0.8–1.2)
Prothrombin Time: 14.3 seconds (ref 11.4–15.2)

## 2021-05-12 LAB — GLUCOSE, CAPILLARY: Glucose-Capillary: 123 mg/dL — ABNORMAL HIGH (ref 70–99)

## 2021-05-12 LAB — MICROALBUMIN, URINE: Microalb, Ur: 1299.7 ug/mL — ABNORMAL HIGH

## 2021-05-12 LAB — HEPARIN LEVEL (UNFRACTIONATED): Heparin Unfractionated: <0.1 IU/mL — ABNORMAL LOW (ref 0.30–0.70)

## 2021-05-12 MED ORDER — ONDANSETRON HCL 4 MG/2ML IJ SOLN: 4.0000 mg | Freq: Four times a day (QID) | INTRAMUSCULAR | Status: DC | PRN

## 2021-05-12 MED ORDER — POLYETHYLENE GLYCOL 3350 17 G PO PACK: 17.0000 g | PACK | Freq: Every day | ORAL | Status: DC | PRN

## 2021-05-12 MED ORDER — INSULIN ASPART 100 UNIT/ML IJ SOLN: 0.0000 [IU] | Freq: Every day | INTRAMUSCULAR | Status: DC

## 2021-05-12 MED ORDER — HEPARIN BOLUS VIA INFUSION
4000.0000 [IU] | Freq: Once | INTRAVENOUS | Status: AC
  Administered 2021-05-12: 4000 [IU] via INTRAVENOUS
  Filled 2021-05-12: qty 4000

## 2021-05-12 MED ORDER — INSULIN ASPART 100 UNIT/ML IJ SOLN
0.0000 [IU] | Freq: Three times a day (TID) | INTRAMUSCULAR | Status: DC
  Administered 2021-05-13: 3 [IU] via SUBCUTANEOUS
  Administered 2021-05-13: 5 [IU] via SUBCUTANEOUS
  Administered 2021-05-14 (×2): 3 [IU] via SUBCUTANEOUS
  Administered 2021-05-15: 2 [IU] via SUBCUTANEOUS
  Administered 2021-05-15: 3 [IU] via SUBCUTANEOUS

## 2021-05-12 MED ORDER — ONDANSETRON HCL 4 MG PO TABS: 4.0000 mg | ORAL_TABLET | Freq: Four times a day (QID) | ORAL | Status: DC | PRN

## 2021-05-12 MED ORDER — HEPARIN (PORCINE) 25000 UT/250ML-% IV SOLN
1350.0000 [IU]/h | INTRAVENOUS | Status: DC
  Administered 2021-05-12 – 2021-05-14 (×3): 1350 [IU]/h via INTRAVENOUS
  Filled 2021-05-12 (×3): qty 250

## 2021-05-12 MED ORDER — ACETAMINOPHEN 650 MG RE SUPP: 650.0000 mg | Freq: Four times a day (QID) | RECTAL | Status: DC | PRN

## 2021-05-12 MED ORDER — ACETAMINOPHEN 325 MG PO TABS: 650.0000 mg | ORAL_TABLET | Freq: Four times a day (QID) | ORAL | Status: DC | PRN

## 2021-05-12 NOTE — H&P (Signed)
History and Physical    [name] NOM:[id number] DOB: 16-Jun-1946 DOA: 05/12/2021  PCP: [name], NP   Patient coming from: [facility]  I have personally briefly reviewed patient's old medical records in [place]  Chief Complaint: Right lower extremity swelling  HPI: [name] is a 75 y.o. male with medical history significant for right spastic hemiplegia, DVT, atrial fibrillation, prostate cancer, coronary artery disease status post CABG and stents, hypertension, diabetes mellitus, peripheral vascular disease, indwelling Foley catheter. Patient was sent to the ED from [facility] with reports of left LE DVT.  History is limited from patient due to baseline dementia.  On my evaluation he is awake and alert, but unable to answer questions.  At baseline he is noncommunicative.  ED Course: Stable vitals.  Right lower extremity venous Dopplers positive for DVT-extensive acute mostly occlusive deep venous thrombosis extending from the right common femoral vein to the calf veins.  Hospitalist to admit.  Review of Systems: Unable to assess due to baseline dementia.  Past Medical History:  Diagnosis Date  . A-fib ([place])    a. Post-op afib after CABG 08/2012.  [name] Acute gastric ulcer with hemorrhage   . Acute respiratory failure with hypoxia ([place])   . Aphasia following cerebral infarction   . Atrial fibrillation ([place])   . CAD (coronary artery disease)    a. NSTEMI s/p stent to distal RCA 03/2000. b. Inf MI s/p emergent thrombectomy/stenting mid RCA 10/2000. c. NSTEMI s/p CABGx4 (LIMA-LAD, SVG-OM2, seq SVG-acute marginal and PD) 08/18/27 - course complicated by confusion, post-op AF, L pleural effusion with thoracentesis. d. Normal LV function by echo 08/2012.  . Diabetes mellitus   . Diverticulosis   . Dysphagia following cerebral infarction   . Encephalopathy   . Extended spectrum beta lactamase (ESBL) resistance   . Generalized anxiety disorder   . GERD (gastroesophageal  reflux disease)   . Hemiplegia and hemiparesis following cerebral infarction affecting right dominant side ([place])   . Hiatal hernia   . HLD (hyperlipidemia)   . HTN (hypertension)   . Non-ST elevation (NSTEMI) myocardial infarction ([place])   . Nontraumatic intracerebral hemorrhage in hemisphere, subcortical ([place])   . Peripheral vascular disease ([place])    a. Carotid dopplers neg 08/13/12. b. Pre-cabg ABIs - R=0.87 suggesting mild dz, L=1.29 possibly falsely elevated due to calcified vessels.  . Pleural effusion    a. L pleural eff after CABG s/p thoracentesis 08/22/12.  . Prostate cancer [facility])    Status post radiation treatment.  . Prostatic hypertrophy    a. Hx of urinary retention, awaiting TURP.  [name] Severe protein-calorie malnutrition ([place])   . Tobacco abuse   . 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 Valvular heart disease    a. Mild  MR by TEE 08/2012.    Past Surgical History:  Procedure Laterality Date  . APPENDECTOMY    . BACK SURGERY    . CORONARY ARTERY BYPASS GRAFT  08/16/2012   Procedure: CORONARY ARTERY BYPASS GRAFTING (CABG);  Surgeon: Melrose Nakayama, MD;  Location: [place];  Service: Open Heart Surgery;  Laterality: N/A;  . IR REPLC GASTRO/COLONIC TUBE PERCUT W/FLUORO  02/13/2019  . LEFT HEART CATHETERIZATION WITH CORONARY ANGIOGRAM N/A 08/14/2012   Procedure: LEFT HEART CATHETERIZATION WITH CORONARY ANGIOGRAM;  Surgeon: Peter M Martinique, MD;  Location: [facility] CATH LAB;  Service: Cardiovascular;  Laterality: N/A;     reports that he quit smoking about 48 years ago.  He has never used smokeless tobacco. He reports that he does not drink alcohol and does not use drugs.  No Known Allergies  Family History  Problem Relation Age of Onset  . Hypertension Mother     Prior to Admission medications   Medication Sig Start Date End Date Taking? Authorizing Provider  acetaminophen (TYLENOL) 325 MG tablet Take 650 mg by mouth every 4 (four) hours as needed. per standing  order for pain/fever DO NOT EXCEED >3,000 mg in 24 hours 06/16/19  Yes [provider]  amLODipine (NORVASC) 5 MG tablet Take 5 mg by mouth daily.    Yes [provider]  atorvastatin (LIPITOR) 40 MG tablet Take 40 mg by mouth at bedtime.  01/29/19  Yes [provider]  baclofen (LIORESAL) 10 MG tablet Take 1 tablet by mouth 2 (two) times daily. Hold for sedation 04/03/19  Yes [provider]  Janne Lab [facility]) OINT Apply topically. Special Instructions: Apply to sacrum, coccyx and bilateral buttocks qshift for prevention/ redness. Every Shift Day, Evening, Night 01/24/21  Yes [provider]  Eyelid Cleansers (OCUSOFT LID SCRUB EX) daily. Cleanse both eye lids   Yes [provider]  FLUoxetine (PROZAC) 20 MG/5ML solution Take 10 mg by mouth daily. 08/21/20  Yes [provider]  Glucerna (GLUCERNA) LIQD Take 237 mLs by mouth daily.   Yes [provider]  Insulin Glargine (LANTUS SOLOSTAR) 100 UNIT/ML Solostar Pen Inject 20 Units into the skin daily.   Yes [provider]  insulin lispro (HUMALOG) 100 UNIT/ML cartridge Inject 10 Units into the skin 3 (three) times daily before meals. Give 10 units SQ with meals if Blood Sugar >150   Yes [provider]  lansoprazole (PREVACID SOLUTAB) 30 MG disintegrating tablet Take 30 mg by mouth daily. Give before meals 04/03/19  Yes [provider]  loperamide (IMODIUM A-D) 2 MG tablet Take 2 mg by mouth as needed for diarrhea or loose stools. 12/19/20  Yes [provider]  Melatonin 1 MG TABS Take 2 tablets by mouth at bedtime.  01/30/19  Yes [provider]  oxybutynin (DITROPAN XL) 15 MG 24 hr tablet Take 15 mg by mouth daily.   Yes [provider]  polyethylene glycol (MIRALAX / GLYCOLAX) 17 g packet Take 17 g by mouth daily as needed. 08/24/20  Yes [provider]  valsartan (DIOVAN) 80 MG tablet Take 80 mg by mouth  daily.   Yes [provider]  hydrocortisone cream 1 % Apply rectally twice daily and as needed for hemorrhoids Patient not taking: Reported on 05/12/2021 01/12/19   [provider]  NON FORMULARY Diet Change: Regular, thin liquids Patient not taking: Reported on 05/12/2021 06/21/19   [provider]    Physical Exam: Exam limited by patient's dementia. Vitals:   05/12/21 1510 05/12/21 1707 05/12/21 2105 05/12/21 2109  BP: (!) 142/78 (!) 149/85  134/81  Pulse: 66 75  66  Resp: 17 17  20   Temp: 98.3 F (36.8 C) 98.4 F (36.9 C)  98.3 F (36.8 C)  TempSrc: Oral Oral  Oral  SpO2: 96% 100%  98%  Weight:   78 kg   Height:   5\' 11"  (1.803 m)     Constitutional: NAD, calm, comfortable Vitals:   05/12/21 1510 05/12/21 1707 05/12/21 2105 05/12/21 2109  BP: (!) 142/78 (!) 149/85  134/81  Pulse: 66 75  66  Resp: 17 17  20   Temp: 98.3 F (36.8 C)  98.4 F (36.9 C)  98.3 F (36.8 C)  TempSrc: Oral Oral  Oral  SpO2: 96% 100%  98%  Weight:   78 kg   Height:   5\' 11"  (1.803 m)    Eyes: PERRL, lids and conjunctivae normal ENMT: Mucous membranes are moist.  Neck: normal, supple, no masses, no thyromegaly Respiratory: clear to auscultation bilaterally, no wheezing, no crackles. Normal respiratory effort. No accessory muscle use.  Cardiovascular: Regular rate and rhythm, no murmurs / rubs / gallops. Right LE pitting edema. 2+ pedal pulses.  Abdomen: no tenderness, no masses palpated. No hepatosplenomegaly. Bowel sounds positive.  Musculoskeletal: no clubbing / cyanosis. No joint deformity upper and lower extremities. Good ROM, no contractures. Normal muscle tone.  Skin: no rashes, lesions, ulcers. No induration Neurologic: No apparent cranial abnormality.  Exam limited by patient's dementia. Psychiatric: Normal judgment and insight. Alert and oriented x 3. Normal mood.   Labs on Admission: I have personally reviewed following labs and imaging studies  CBC: Recent  Labs  Lab 05/12/21 1611  WBC 10.6*  NEUTROABS 7.2  HGB 15.2  HCT 47.9  MCV 95.0  PLT 295   Basic Metabolic Panel: Recent Labs  Lab 05/12/21 1611  NA 137  K 4.4  CL 103  CO2 25  GLUCOSE 233*  BUN 49*  CREATININE 1.98*  CALCIUM 8.2*   Coagulation Profile: Recent Labs  Lab 05/12/21 2019  INR 1.1    Radiological Exams on Admission: US Venous Img Lower Right (DVT Study)  Result Date: 05/12/2021 CLINICAL DATA:  Right leg swelling. EXAM: RIGHT LOWER EXTREMITY VENOUS DOPPLER ULTRASOUND TECHNIQUE: Gray-scale sonography with compression, as well as color and duplex ultrasound, were performed to evaluate the deep venous system(s) from the level of the common femoral vein through the popliteal and proximal calf veins. COMPARISON:  None. FINDINGS: VENOUS Extensive acute deep venous thrombosis extending from the right common femoral vein into the calf veins. Thrombus also involves the profundal femoral vein. Thrombus is nonocclusive in the right common femoral vein, but occlusive throughout the remaining deep veins of the right leg. Limited views of the contralateral common femoral vein are unremarkable. OTHER None. Limitations: None. IMPRESSION: 1. Extensive acute mostly occlusive deep venous thrombosis extending from the right common femoral vein into the calf veins. Electronically Signed   By: Titus Dubin M.D.   On: 05/12/2021 17:06    EKG: Pending  Assessment/Plan Active Problems:   Right leg DVT (HCC)   Right lower extremity DVT-venous Doppler shows extensive acute mostly occlusive DVT extending from right common femoral vein into calf veins.  Vitals stable.  He has no respiratory symptoms.  He has a history of GI bleed and hospitalization at [place] for hemorrhagic stroke in 2019- documented in ED provider notes 12/2018.  No records in Care Everywhere.  No details of GI bleed documented-likely remote history -IV heparin -Monitor closely for bleeding - Resume PPI  Coronary  artery disease-history of CABG and stents. -Not on antiplatelets. -Resume statin  Hypertension-stable -Resume Norvasc 5 mg, valsartan  Controlled Diabetes mellitus-random gluocse 233. A1c 6.6 - SSI- M -Resume Lantus 20 units daily  CKD 3 B-creatinine 1.98.  Stable.  History of hemorrhagic stroke-December 2019 to January 2020. -Monitor while on heparin  Depression-resume fluoxetine  DVT prophylaxis: Heparin Code Status: Full code, most form at bedside.  Family Communication: None at bedside Disposition Plan: ~ 1 - 2 days Consults called: None Admission status: Obs, tele    Bartt Gonzaga Arlyce Dice MD [facility]  05/12/2021, 10:51 PM

## 2021-05-12 NOTE — ED Provider Notes (Signed)
[facility] EMERGENCY DEPARTMENT Provider Note   CSN: [id number] Arrival date & time: 05/12/21  1447     History Chief Complaint  Patient presents with  . Leg Pain    [name] is a 75 y.o. male.  Patient sent in from [facility].  Reportedly had Doppler study done of left lower extremity showing extensive DVTs.  Contacted radiology department here.  Had no record of doing an ultrasound of his lower extremity here.  Clinically the swelling is to the right lower extremity.  Patient has a history of atrial for but not on any blood thinners.  Has a history of dysphagia following cerebral infarction.  Has encephalopathy.  Has dementia.  Patient moving all 4 extremities but not cooperative with any type of communication or following commands very well.        Past Medical History:  Diagnosis Date  . A-fib ([place])    a. Post-op afib after CABG 08/2012.  [name] Acute gastric ulcer with hemorrhage   . Acute respiratory failure with hypoxia ([place])   . Aphasia following cerebral infarction   . Atrial fibrillation ([place])   . CAD (coronary artery disease)    a. NSTEMI s/p stent to distal RCA 03/2000. b. Inf MI s/p emergent thrombectomy/stenting mid RCA 10/2000. c. NSTEMI s/p CABGx4 (LIMA-LAD, SVG-OM2, seq SVG-acute marginal and PD) 01/14/29 - course complicated by confusion, post-op AF, L pleural effusion with thoracentesis. d. Normal LV function by echo 08/2012.  . Diabetes mellitus   . Diverticulosis   . Dysphagia following cerebral infarction   . Encephalopathy   . Extended spectrum beta lactamase (ESBL) resistance   . Generalized anxiety disorder   . GERD (gastroesophageal reflux disease)   . Hemiplegia and hemiparesis following cerebral infarction affecting right dominant side ([place])   . Hiatal hernia   . HLD (hyperlipidemia)   . HTN (hypertension)   . Non-ST elevation (NSTEMI) myocardial infarction ([place])   . Nontraumatic intracerebral hemorrhage in hemisphere, subcortical ([place])   .  Peripheral vascular disease ([place])    a. Carotid dopplers neg 08/13/12. b. Pre-cabg ABIs - R=0.87 suggesting mild dz, L=1.29 possibly falsely elevated due to calcified vessels.  . Pleural effusion    a. L pleural eff after CABG s/p thoracentesis 08/22/12.  . Prostate cancer [facility])    Status post radiation treatment.  . Prostatic hypertrophy    a. Hx of urinary retention, awaiting TURP.  [name] Severe protein-calorie malnutrition ([place])   . Tobacco abuse   . 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 Valvular heart disease    a. Mild  MR by TEE 08/2012.    Patient Active Problem List   Diagnosis Date Noted  . Acute deep vein thrombosis (DVT) of right lower extremity ([place]) 05/12/2021  . Right leg DVT ([place]) 05/12/2021  . Bladder spasms 02/12/2021  . Aortic atherosclerosis ([place]) 12/17/2020  . Expressive aphasia 11/18/2020  . Bruise 11/18/2020  . Right spastic hemiplegia ([place]) 05/27/2020  . Microalbuminuria due to type 2 diabetes mellitus ([place]) 06/04/2019  . CKD stage 3 due to type 2 diabetes mellitus ([place]) 06/04/2019  . Foley catheter in place 04/25/2019  . Penis injury, sequela 04/25/2019  . Hypertension associated with diabetes ([place]) 01/28/2019  . Dyslipidemia associated with type 2 diabetes mellitus ([place]) 01/28/2019  . Dysphagia, oropharyngeal phase 01/28/2019  . Type 2 diabetes mellitus with peripheral vascular disease ([place]) 01/28/2019  . Major depression with psychotic features ([place]) 01/28/2019  . Protein-calorie malnutrition, severe 01/08/2019  .  History of hemorrhagic cerebrovascular accident (CVA) with residual deficit 01/04/2019  . Urinary retention 12/24/2018  . IVH (intraventricular hemorrhage) ([place]) 11/18/2018  . Nontraumatic subcortical hemorrhage of left cerebral hemisphere ([place]) 11/18/2018  . CAD (coronary artery disease)   . Peripheral vascular disease ([place])   . A-fib One Episode after CABG in 2013   . HTN (hypertension) 08/15/2012    Past Surgical History:   Procedure Laterality Date  . APPENDECTOMY    . BACK SURGERY    . CORONARY ARTERY BYPASS GRAFT  08/16/2012   Procedure: CORONARY ARTERY BYPASS GRAFTING (CABG);  Surgeon: Melrose Nakayama, MD;  Location: [place];  Service: Open Heart Surgery;  Laterality: N/A;  . IR REPLC GASTRO/COLONIC TUBE PERCUT W/FLUORO  02/13/2019  . LEFT HEART CATHETERIZATION WITH CORONARY ANGIOGRAM N/A 08/14/2012   Procedure: LEFT HEART CATHETERIZATION WITH CORONARY ANGIOGRAM;  Surgeon: Peter M Martinique, MD;  Location: [facility] CATH LAB;  Service: Cardiovascular;  Laterality: N/A;       Family History  Problem Relation Age of Onset  . Hypertension Mother     Social History   Tobacco Use  . Smoking status: Former Smoker    Quit date: 11/12/1972    Years since quitting: 48.5  . Smokeless tobacco: Never Used  Vaping Use  . Vaping Use: Never used  Substance Use Topics  . Alcohol use: No  . Drug use: No    Home Medications Prior to Admission medications   Medication Sig Start Date End Date Taking? Authorizing Provider  acetaminophen (TYLENOL) 325 MG tablet Take 650 mg by mouth every 4 (four) hours as needed. per standing order for pain/fever DO NOT EXCEED >3,000 mg in 24 hours 06/16/19  Yes [provider]  amLODipine (NORVASC) 5 MG tablet Take 5 mg by mouth daily.    Yes [provider]  atorvastatin (LIPITOR) 40 MG tablet Take 40 mg by mouth at bedtime.  01/29/19  Yes [provider]  baclofen (LIORESAL) 10 MG tablet Take 1 tablet by mouth 2 (two) times daily. Hold for sedation 04/03/19  Yes [provider]  Janne Lab Oil [facility]) OINT Apply topically. Special Instructions: Apply to sacrum, coccyx and bilateral buttocks qshift for prevention/ redness. Every Shift Day, Evening, Night 01/24/21  Yes [provider]  Eyelid Cleansers (OCUSOFT LID SCRUB EX) daily. Cleanse both eye lids   Yes [provider]  FLUoxetine (PROZAC) 20 MG/5ML solution Take 10 mg by  mouth daily. 08/21/20  Yes [provider]  Glucerna (GLUCERNA) LIQD Take 237 mLs by mouth daily.   Yes [provider]  Insulin Glargine (LANTUS SOLOSTAR) 100 UNIT/ML Solostar Pen Inject 20 Units into the skin daily.   Yes [provider]  insulin lispro (HUMALOG) 100 UNIT/ML cartridge Inject 10 Units into the skin 3 (three) times daily before meals. Give 10 units SQ with meals if Blood Sugar >150   Yes [provider]  lansoprazole (PREVACID SOLUTAB) 30 MG disintegrating tablet Take 30 mg by mouth daily. Give before meals 04/03/19  Yes [provider]  loperamide (IMODIUM A-D) 2 MG tablet Take 2 mg by mouth as needed for diarrhea or loose stools. 12/19/20  Yes [provider]  Melatonin 1 MG TABS Take 2 tablets by mouth at bedtime.  01/30/19  Yes [provider]  oxybutynin (DITROPAN XL) 15 MG 24 hr tablet Take 15 mg by mouth daily.   Yes [provider]  polyethylene glycol (MIRALAX / GLYCOLAX) 17 g packet Take 17  g by mouth daily as needed. 08/24/20  Yes [provider]  valsartan (DIOVAN) 80 MG tablet Take 80 mg by mouth daily.   Yes [provider]  hydrocortisone cream 1 % Apply rectally twice daily and as needed for hemorrhoids Patient not taking: Reported on 05/12/2021 01/12/19   [provider]  NON FORMULARY Diet Change: Regular, thin liquids Patient not taking: Reported on 05/12/2021 06/21/19   [provider]    Allergies    Patient has no known allergies.  Review of Systems   Review of Systems  Unable to perform ROS: Dementia    Physical Exam Updated Vital Signs BP (!) 149/85 (BP Location: Right Arm)   Pulse 75   Temp 98.4 F (36.9 C) (Oral)   Resp 17   Ht 1.803 m (5\' 11" )   Wt 78 kg   SpO2 100%   BMI 23.99 kg/m   Physical Exam Vitals and nursing note reviewed.  Constitutional:      Appearance: Normal appearance. He is well-developed.  HENT:     Head: Normocephalic  and atraumatic.  Eyes:     Extraocular Movements: Extraocular movements intact.     Conjunctiva/sclera: Conjunctivae normal.     Pupils: Pupils are equal, round, and reactive to light.  Cardiovascular:     Rate and Rhythm: Normal rate and regular rhythm.     Heart sounds: No murmur heard.   Pulmonary:     Effort: Pulmonary effort is normal. No respiratory distress.     Breath sounds: Normal breath sounds.  Abdominal:     Palpations: Abdomen is soft.     Tenderness: There is no abdominal tenderness.  Musculoskeletal:        General: Swelling present. Normal range of motion.     Cervical back: Neck supple.     Comments: Right lower extremity with extensive swelling.  Redness from the knee below.  Good cap refill.  Left lower extremity without any edema or swelling.  Some redness just to the toes.  Cap refill.  Skin:    General: Skin is warm and dry.  Neurological:     General: No focal deficit present.     Mental Status: He is alert. Mental status is at baseline.     Comments: Patient moving all extremities spontaneously.     ED Results / Procedures / Treatments   Labs (all labs ordered are listed, but only abnormal results are displayed) Labs Reviewed  CBC WITH DIFFERENTIAL/PLATELET - Abnormal; Notable for the following components:      Result Value   WBC 10.6 (*)    All other components within normal limits  BASIC METABOLIC PANEL - Abnormal; Notable for the following components:   Glucose, Bld 233 (*)    BUN 49 (*)    Creatinine, Ser 1.98 (*)    Calcium 8.2 (*)    GFR, Estimated 34 (*)    All other components within normal limits  SARS CORONAVIRUS 2 (TAT 6-24 HRS)    EKG None  Radiology US Venous Img Lower Right (DVT Study)  Result Date: 05/12/2021 CLINICAL DATA:  Right leg swelling. EXAM: RIGHT LOWER EXTREMITY VENOUS DOPPLER ULTRASOUND TECHNIQUE: Gray-scale sonography with compression, as well as color and duplex ultrasound, were performed to evaluate the deep  venous system(s) from the level of the common femoral vein through the popliteal and proximal calf veins. COMPARISON:  None. FINDINGS: VENOUS Extensive acute deep venous thrombosis extending from the right common femoral vein into the calf  veins. Thrombus also involves the profundal femoral vein. Thrombus is nonocclusive in the right common femoral vein, but occlusive throughout the remaining deep veins of the right leg. Limited views of the contralateral common femoral vein are unremarkable. OTHER None. Limitations: None. IMPRESSION: 1. Extensive acute mostly occlusive deep venous thrombosis extending from the right common femoral vein into the calf veins. Electronically Signed   By: Titus Dubin M.D.   On: 05/12/2021 17:06    Procedures Procedures   CRITICAL CARE Performed by: Fredia Sorrow Total critical care time: 35 minutes Critical care time was exclusive of separately billable procedures and treating other patients. Critical care was necessary to treat or prevent imminent or life-threatening deterioration. Critical care was time spent personally by me on the following activities: development of treatment plan with patient and/or surrogate as well as nursing, discussions with consultants, evaluation of patient's response to treatment, examination of patient, obtaining history from patient or surrogate, ordering and performing treatments and interventions, ordering and review of laboratory studies, ordering and review of radiographic studies, pulse oximetry and re-evaluation of patient's condition.   Medications Ordered in ED Medications - No data to display  ED Course  I have reviewed the triage vital signs and the nursing notes.  Pertinent labs & imaging results that were available during my care of the patient were reviewed by me and considered in my medical decision making (see chart for details).    MDM Rules/Calculators/A&P                         Patient with extensive deep  vein thrombosis to right lower extremity.  Femoral all the way down into the calf.  Occlusive in nature.  Patient has some erythema good cap refill.  Has swelling to the right lower extremity.  Not seem to have any pain.  Patient will be started on heparin.  And then transition.  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  But as mentioned no pain.  They will keep that in mind.  Sometimes it requires vascular surgery consult.  But worth opting not to do that at this point in time hospitalist will admit.  Final Clinical Impression(s) / ED Diagnoses Final diagnoses:  Acute deep vein thrombosis (DVT) of proximal vein of right lower extremity [facility])    Rx / DC Orders ED Discharge Orders    None       Fredia Sorrow, MD 05/12/21 1911

## 2021-05-12 NOTE — ED Triage Notes (Signed)
Pt arrived [facility] from [facility]. Pt had a doppler to left leg and stated blood clots form foot to groin, to sent to ER. No pain , or redness noted to left leg. Pt arrived with foley cath in place. MOST FORM with pt.

## 2021-05-12 NOTE — Progress Notes (Signed)
Location:  [facility] Room Number: 092-Z Place of Service:  SNF (31)   CODE STATUS: Full Code   No Known Allergies  Chief Complaint  Patient presents with  . Acute Visit    Right foot swelling     HPI:  Family reports that his right foot is swollen and red. This AM there is little warm present; 1-2+ edema present. He denies pain; but is verbally unreliable. There are no pulses present: pedal; post tib; popliteal. There are no reports of shortness of breath or chest pain present. He does have a history of cerebral hemorrhage in 2020. He had a venous doppler performed with a preliminary report of clot from groin to foot. I have spoken with Dr. Linna Darner regarding treatment options. IVC filter by research is not an effective treatment. At this point; with the extensive clot he will need to go to the ED.    Past Medical History:  Diagnosis Date  . A-fib ([place])    a. Post-op afib after CABG 08/2012.  [name] Acute gastric ulcer with hemorrhage   . Acute respiratory failure with hypoxia ([place])   . Aphasia following cerebral infarction   . Atrial fibrillation ([place])   . CAD (coronary artery disease)    a. NSTEMI s/p stent to distal RCA 03/2000. b. Inf MI s/p emergent thrombectomy/stenting mid RCA 10/2000. c. NSTEMI s/p CABGx4 (LIMA-LAD, SVG-OM2, seq SVG-acute marginal and PD) 3/0/07 - course complicated by confusion, post-op AF, L pleural effusion with thoracentesis. d. Normal LV function by echo 08/2012.  . Diabetes mellitus   . Diverticulosis   . Dysphagia following cerebral infarction   . Encephalopathy   . Extended spectrum beta lactamase (ESBL) resistance   . Generalized anxiety disorder   . GERD (gastroesophageal reflux disease)   . Hemiplegia and hemiparesis following cerebral infarction affecting right dominant side ([place])   . Hiatal hernia   . HLD (hyperlipidemia)   . HTN (hypertension)   . Non-ST elevation (NSTEMI) myocardial infarction ([place])   . Nontraumatic  intracerebral hemorrhage in hemisphere, subcortical ([place])   . Peripheral vascular disease ([place])    a. Carotid dopplers neg 08/13/12. b. Pre-cabg ABIs - R=0.87 suggesting mild dz, L=1.29 possibly falsely elevated due to calcified vessels.  . Pleural effusion    a. L pleural eff after CABG s/p thoracentesis 08/22/12.  . Prostate cancer [facility])    Status post radiation treatment.  . Prostatic hypertrophy    a. Hx of urinary retention, awaiting TURP.  [name] Severe protein-calorie malnutrition ([place])   . Tobacco abuse   . 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 Valvular heart disease    a. Mild  MR by TEE 08/2012.    Past Surgical History:  Procedure Laterality Date  . APPENDECTOMY    . BACK SURGERY    . CORONARY ARTERY BYPASS GRAFT  08/16/2012   Procedure: CORONARY ARTERY BYPASS GRAFTING (CABG);  Surgeon: Melrose Nakayama, MD;  Location: [facility];  Service: Open Heart Surgery;  Laterality: N/A;  . IR REPLC GASTRO/COLONIC TUBE PERCUT W/FLUORO  02/13/2019  . LEFT HEART CATHETERIZATION WITH CORONARY ANGIOGRAM N/A 08/14/2012   Procedure: LEFT HEART CATHETERIZATION WITH CORONARY ANGIOGRAM;  Surgeon: Peter M Martinique, MD;  Location: [facility] CATH LAB;  Service: Cardiovascular;  Laterality: N/A;    Social History   Socioeconomic History  . Marital status: Married    Spouse name: Not on file  . Number of children: Not on file  . Years  of education: Not on file  . Highest education level: Not on file  Occupational History  . Occupation: retired   Tobacco Use  . Smoking status: Former Smoker    Quit date: 11/12/1972    Years since quitting: 48.5  . Smokeless tobacco: Never Used  Vaping Use  . Vaping Use: Never used  Substance and Sexual Activity  . Alcohol use: No  . Drug use: No  . Sexual activity: Not Currently  Other Topics Concern  . Not on file  Social History Narrative   He is a long term patient of [facility]    Social Determinants of Health   Financial Resource Strain: Not on file   Food Insecurity: Not on file  Transportation Needs: Not on file  Physical Activity: Not on file  Stress: Not on file  Social Connections: Not on file  Intimate Partner Violence: Not on file   Family History  Problem Relation Age of Onset  . Hypertension Mother       VITAL SIGNS BP (!) 155/63   Pulse 70   Temp 98.1 F (36.7 C)   Resp 20   Ht 5' 11"  (1.803 m)   Wt 172 lb (78 kg)   SpO2 100%   BMI 23.99 kg/m   Outpatient Encounter Medications as of 05/12/2021  Medication Sig  . acetaminophen (TYLENOL) 325 MG tablet Take 650 mg by mouth every 4 (four) hours as needed. per standing order for pain/fever DO NOT EXCEED >3,000 mg in 24 hours  . amLODipine (NORVASC) 5 MG tablet Take 5 mg by mouth daily.   [name] atorvastatin (LIPITOR) 40 MG tablet Take 40 mg by mouth at bedtime.   . baclofen (LIORESAL) 10 MG tablet Take 1 tablet by mouth 2 (two) times daily. Hold for sedation  . Balsam [place]-Castor Oil (VENELEX) OINT Apply topically. Special Instructions: Apply to sacrum, coccyx and bilateral buttocks qshift for prevention/ redness. Every Shift Day, Evening, Night  . Eyelid Cleansers (OCUSOFT LID SCRUB EX) daily. Cleanse both eye lids  . FLUoxetine (PROZAC) 20 MG/5ML solution Take 10 mg by mouth daily.  . Glucerna (GLUCERNA) LIQD Take 237 mLs by mouth daily.  . hydrocortisone cream 1 % Apply rectally twice daily and as needed for hemorrhoids  . Insulin Glargine (LANTUS SOLOSTAR) 100 UNIT/ML Solostar Pen Inject 20 Units into the skin daily.  . insulin lispro (HUMALOG) 100 UNIT/ML cartridge Inject 10 Units into the skin 3 (three) times daily before meals. Give 10 units SQ with meals if Blood Sugar >150  . lansoprazole (PREVACID SOLUTAB) 30 MG disintegrating tablet Take 30 mg by mouth daily. Give before meals  . loperamide (IMODIUM A-D) 2 MG tablet Take 2 mg by mouth as needed for diarrhea or loose stools.  . Melatonin 1 MG TABS Take 2 tablets by mouth at bedtime.   . NON FORMULARY Diet  Change: Regular, thin liquids  . oxybutynin (DITROPAN XL) 15 MG 24 hr tablet Take 15 mg by mouth at bedtime.  . polyethylene glycol (MIRALAX / GLYCOLAX) 17 g packet Take 17 g by mouth daily as needed.  . valsartan (DIOVAN) 80 MG tablet Take 80 mg by mouth daily.   No facility-administered encounter medications on file as of 05/12/2021.     SIGNIFICANT DIAGNOSTIC EXAMS  TODAY  05-12-21: right lower extremity venous doppler: preliminary report: clot from groin to foot.   LABS REVIEWED PREVIOUS:    05-07-20: urine for micro-albumin: 413.2 ( on ARB)  05-18-20: wbc 9.2; hgb 14.8; hct  45.8; mcv 92.5 plt 270; glucose 128; bun 32; creat 1.60 ;k+ 4.3; na++ 140; ca 8.6 alk phos 142 albumin 2.5 05-19-20: glucose 109; bun 34; creat 1.79; k+ 4.0; na++ 140; ca 8.2 alk phos 145; albumin 2.3  05-22-20: glucose 84; bun 26; creat 1.58; k+ 4.7; na++ 140; ca 8.5 05-25-20: glucose 96; bun 23; creat 1.42; k+ 4.0; na++ 139; ca 8.1  07-23-20: hgb a1c 6.2; chol 79; ldl 48; trig 53; hdl 20  10-29-20: glucose 103; bun 39; creat 1.64; k+ 4.0; na++ 140; ca 8.5  01-07-21: wbc 7.8; hgb 15.3; hct 47.7; mcv 93.7 plt 180; glucose 101; bun 45; creat 1.85; k+ 4.4; na++ 140; ca 8.6 GFR 37; liver normal albumin 2.8 chol 101; ldl 60; trig 77; hdl 26; hgb a1c 6.5; tsh 1.908 02-15-21: urine culture: multiple species  03-25-21 hgb a1c 6.6  TODAY  05-11-21: urine for micro albumin 1299.7 (413.2) on ARB     Review of Systems  Reason unable to perform ROS: expressive aphasia    Physical Exam Constitutional:      General: He is not in acute distress.    Appearance: He is well-developed. He is not diaphoretic.  Neck:     Thyroid: No thyromegaly.  Cardiovascular:     Rate and Rhythm: Normal rate and regular rhythm.     Heart sounds: Normal heart sounds.     Comments: Hx cabg  Pulmonary:     Effort: Pulmonary effort is normal. No respiratory distress.     Breath sounds: Normal breath sounds.  Abdominal:     General: Bowel sounds are  normal. There is no distension.     Palpations: Abdomen is soft.     Tenderness: There is no abdominal tenderness.  Genitourinary:    Comments: Foley with penile tear Musculoskeletal:     Cervical back: Neck supple.     Right lower leg: Edema present.     Left lower leg: No edema.     Comments: Right hemiplegia Right foot without pedal; post time popliteal pulses  1-2 + right lower extremity edema   Lymphadenopathy:     Cervical: No cervical adenopathy.  Skin:    General: Skin is warm and dry.  Neurological:     Mental Status: He is alert. Mental status is at baseline.  Psychiatric:        Mood and Affect: Mood normal.       ASSESSMENT/ PLAN:  TODAY  1. Acute deep vein thrombosis (DVT) of other specified vein of right lower extremity  Is worse:  At this time will send to the ED for further follow up and treatment options.      Ok Edwards NP [facility]  Contact [phone] Monday through Friday 8am- 5pm  After hours call [phone]

## 2021-05-12 NOTE — Progress Notes (Addendum)
ADDENDUM:  Heparin level and aPTT are wnl F/u 8 hour level    ANTICOAGULATION CONSULT NOTE - Initial Consult  Pharmacy Consult for heparin Indication: DVT  No Known Allergies  Patient Measurements: Height: 5\' 11"  (180.3 cm) Weight: 78 kg (172 lb) IBW/kg (Calculated) : 75.3 Heparin Dosing Weight: 78 kg  Vital Signs: Temp: 98.4 F (36.9 C) (06/01 1707) Temp Source: Oral (06/01 1707) BP: 149/85 (06/01 1707) Pulse Rate: 75 (06/01 1707)  Labs: Recent Labs    05/12/21 1611  HGB 15.2  HCT 47.9  PLT 181  CREATININE 1.98*    Estimated Creatinine Clearance: 33.8 mL/min (A) (by C-G formula based on SCr of 1.98 mg/dL (H)).   Medical History: Past Medical History:  Diagnosis Date  . A-fib ([place])    a. Post-op afib after CABG 08/2012.  [name] Acute gastric ulcer with hemorrhage   . Acute respiratory failure with hypoxia ([place])   . Aphasia following cerebral infarction   . Atrial fibrillation ([place])   . CAD (coronary artery disease)    a. NSTEMI s/p stent to distal RCA 03/2000. b. Inf MI s/p emergent thrombectomy/stenting mid RCA 10/2000. c. NSTEMI s/p CABGx4 (LIMA-LAD, SVG-OM2, seq SVG-acute marginal and PD) 12/12/89 - course complicated by confusion, post-op AF, L pleural effusion with thoracentesis. d. Normal LV function by echo 08/2012.  . Diabetes mellitus   . Diverticulosis   . Dysphagia following cerebral infarction   . Encephalopathy   . Extended spectrum beta lactamase (ESBL) resistance   . Generalized anxiety disorder   . GERD (gastroesophageal reflux disease)   . Hemiplegia and hemiparesis following cerebral infarction affecting right dominant side ([place])   . Hiatal hernia   . HLD (hyperlipidemia)   . HTN (hypertension)   . Non-ST elevation (NSTEMI) myocardial infarction ([place])   . Nontraumatic intracerebral hemorrhage in hemisphere, subcortical ([place])   . Peripheral vascular disease ([place])    a. Carotid dopplers neg 08/13/12. b. Pre-cabg ABIs - R=0.87 suggesting mild dz,  L=1.29 possibly falsely elevated due to calcified vessels.  . Pleural effusion    a. L pleural eff after CABG s/p thoracentesis 08/22/12.  . Prostate cancer [facility])    Status post radiation treatment.  . Prostatic hypertrophy    a. Hx of urinary retention, awaiting TURP.  [name] Severe protein-calorie malnutrition ([place])   . Tobacco abuse   . 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 Valvular heart disease    a. Mild  MR by TEE 08/2012.     Assessment: 1 yoM admitted d/t extensive DVTs in RLE on dopplers. PMH includes AFib NOT on anticoagulation, cerebral infarction, DM, HTN, CAD.  Pharmacy consulted to start heparin infusion. CBC wnl  Goal of Therapy:  Heparin level 0.3-0.7 units/ml Monitor platelets by anticoagulation protocol: Yes   Plan:  Check baseline heparin level and aPTT Give 4000 units bolus x 1 Start heparin infusion at 1350 units/hr Check anti-Xa level in 8 hours and daily while on heparin Continue to monitor H&H and platelets   Thank you for allowing [place] to participate in this patients care. Jens Som, PharmD 05/12/2021 8:44 PM  Please check [email or web address] for unit-specific pharmacy phone numbers.

## 2021-05-13 DIAGNOSIS — I10 Essential (primary) hypertension: Secondary | ICD-10-CM

## 2021-05-13 DIAGNOSIS — I82401 Acute embolism and thrombosis of unspecified deep veins of right lower extremity: Secondary | ICD-10-CM | POA: Diagnosis not present

## 2021-05-13 DIAGNOSIS — I2510 Atherosclerotic heart disease of native coronary artery without angina pectoris: Secondary | ICD-10-CM | POA: Diagnosis not present

## 2021-05-13 DIAGNOSIS — I824Y1 Acute embolism and thrombosis of unspecified deep veins of right proximal lower extremity: Secondary | ICD-10-CM | POA: Diagnosis not present

## 2021-05-13 LAB — HEPARIN LEVEL (UNFRACTIONATED)
Heparin Unfractionated: 0.6 IU/mL (ref 0.30–0.70)
Heparin Unfractionated: 0.57 IU/mL (ref 0.30–0.70)

## 2021-05-13 LAB — CBC
HCT: 47.6 % (ref 39.0–52.0)
Hemoglobin: 15 g/dL (ref 13.0–17.0)
MCH: 29.6 pg (ref 26.0–34.0)
MCHC: 31.5 g/dL (ref 30.0–36.0)
MCV: 93.9 fL (ref 80.0–100.0)
Platelets: 201 10*3/uL (ref 150–400)
RBC: 5.07 MIL/uL (ref 4.22–5.81)
RDW: 12.8 % (ref 11.5–15.5)
WBC: 7.6 10*3/uL (ref 4.0–10.5)
nRBC: 0 % (ref 0.0–0.2)

## 2021-05-13 LAB — BASIC METABOLIC PANEL
Anion gap: 6 (ref 5–15)
BUN: 41 mg/dL — ABNORMAL HIGH (ref 8–23)
CO2: 30 mmol/L (ref 22–32)
Calcium: 8.5 mg/dL — ABNORMAL LOW (ref 8.9–10.3)
Chloride: 103 mmol/L (ref 98–111)
Creatinine, Ser: 1.8 mg/dL — ABNORMAL HIGH (ref 0.61–1.24)
GFR, Estimated: 39 mL/min — ABNORMAL LOW (ref >60)
Glucose, Bld: 110 mg/dL — ABNORMAL HIGH (ref 70–99)
Potassium: 4 mmol/L (ref 3.5–5.1)
Sodium: 139 mmol/L (ref 135–145)

## 2021-05-13 LAB — GLUCOSE, CAPILLARY
Glucose-Capillary: 119 mg/dL — ABNORMAL HIGH (ref 70–99)
Glucose-Capillary: 227 mg/dL — ABNORMAL HIGH (ref 70–99)
Glucose-Capillary: 121 mg/dL — ABNORMAL HIGH (ref 70–99)
Glucose-Capillary: 88 mg/dL (ref 70–99)

## 2021-05-13 LAB — SARS CORONAVIRUS 2 (TAT 6-24 HRS): SARS Coronavirus 2: NEGATIVE

## 2021-05-13 MED ORDER — CHLORHEXIDINE GLUCONATE CLOTH 2 % EX PADS
6.0000 | MEDICATED_PAD | Freq: Every day | CUTANEOUS | Status: DC
  Administered 2021-05-14 – 2021-05-15 (×2): 6 via TOPICAL

## 2021-05-13 MED ORDER — OXYBUTYNIN CHLORIDE ER 15 MG PO TB24
15.0000 mg | ORAL_TABLET | Freq: Every day | ORAL | Status: DC
  Administered 2021-05-13 – 2021-05-15 (×3): 15 mg via ORAL
  Filled 2021-05-13: qty 3
  Filled 2021-05-13 (×2): qty 1

## 2021-05-13 MED ORDER — MELATONIN 3 MG PO TABS
3.0000 mg | ORAL_TABLET | Freq: Every day | ORAL | Status: DC
  Administered 2021-05-13 – 2021-05-14 (×2): 3 mg via ORAL
  Filled 2021-05-13 (×2): qty 1

## 2021-05-13 MED ORDER — INSULIN GLARGINE 100 UNIT/ML ~~LOC~~ SOLN
10.0000 [IU] | Freq: Every day | SUBCUTANEOUS | Status: DC
  Administered 2021-05-13 – 2021-05-15 (×3): 10 [IU] via SUBCUTANEOUS
  Filled 2021-05-13 (×5): qty 0.1

## 2021-05-13 MED ORDER — SODIUM CHLORIDE 0.9 % IV SOLN: INTRAVENOUS | Status: AC

## 2021-05-13 MED ORDER — ATORVASTATIN CALCIUM 40 MG PO TABS
40.0000 mg | ORAL_TABLET | Freq: Every day | ORAL | Status: DC
  Administered 2021-05-13 – 2021-05-14 (×2): 40 mg via ORAL
  Filled 2021-05-13 (×2): qty 1

## 2021-05-13 MED ORDER — AMLODIPINE BESYLATE 5 MG PO TABS
5.0000 mg | ORAL_TABLET | Freq: Every day | ORAL | Status: DC
  Administered 2021-05-13 – 2021-05-15 (×3): 5 mg via ORAL
  Filled 2021-05-13 (×3): qty 1

## 2021-05-13 MED ORDER — FLUOXETINE HCL 10 MG PO CAPS
10.0000 mg | ORAL_CAPSULE | Freq: Every day | ORAL | Status: DC
  Administered 2021-05-13 – 2021-05-15 (×3): 10 mg via ORAL
  Filled 2021-05-13 (×3): qty 1

## 2021-05-13 MED ORDER — GLUCERNA SHAKE PO LIQD
237.0000 mL | Freq: Every day | ORAL | Status: DC
  Administered 2021-05-13 – 2021-05-15 (×3): 237 mL via ORAL

## 2021-05-13 NOTE — Care Management Obs Status (Signed)
[facility] NOTIFICATION   Patient Details  Name: [name] MRN: [id number] Date of Birth: 01/11/1946   Medicare Observation Status Notification Given:  Yes    Tommy Medal 05/13/2021, 4:39 PM

## 2021-05-13 NOTE — Progress Notes (Signed)
[facility] in to get patient. Report Given to [facility] on 4E at [facility]. Patient left via stretcher in stable condition.

## 2021-05-13 NOTE — Progress Notes (Signed)
Patient arrived on the unit from [facility] via carelink , assessment completed see flow sheet placed on tele ccmd notified, patient oriented to room and staff, bed in lowest position call bell within reach will continue to monitor.

## 2021-05-13 NOTE — Progress Notes (Signed)
ANTICOAGULATION CONSULT NOTE -  Pharmacy Consult for heparin Indication: DVT  No Known Allergies  Patient Measurements: Height: 5\' 11"  (180.3 cm) Weight: 78 kg (171 lb 15.3 oz) IBW/kg (Calculated) : 75.3 Heparin Dosing Weight: 78 kg  Vital Signs: Temp: 97.5 F (36.4 C) (06/02 0640) Temp Source: Oral (06/01 2109) BP: 143/79 (06/02 0640) Pulse Rate: 61 (06/02 0640)  Labs: Recent Labs    05/12/21 1611 05/12/21 2019 05/13/21 0645  HGB 15.2  --  15.0  HCT 47.9  --  47.6  PLT 181  --  201  APTT  --  32  --   LABPROT  --  14.3  --   INR  --  1.1  --   HEPARINUNFRC  --  <0.10* 0.60  CREATININE 1.98*  --  1.80*    Estimated Creatinine Clearance: 37.2 mL/min (A) (by C-G formula based on SCr of 1.8 mg/dL (H)).   Medical History: Past Medical History:  Diagnosis Date  . A-fib ([place])    a. Post-op afib after CABG 08/2012.  [name] Acute gastric ulcer with hemorrhage   . Acute respiratory failure with hypoxia ([place])   . Aphasia following cerebral infarction   . Atrial fibrillation ([place])   . CAD (coronary artery disease)    a. NSTEMI s/p stent to distal RCA 03/2000. b. Inf MI s/p emergent thrombectomy/stenting mid RCA 10/2000. c. NSTEMI s/p CABGx4 (LIMA-LAD, SVG-OM2, seq SVG-acute marginal and PD) 08/19/32 - course complicated by confusion, post-op AF, L pleural effusion with thoracentesis. d. Normal LV function by echo 08/2012.  . Diabetes mellitus   . Diverticulosis   . Dysphagia following cerebral infarction   . Encephalopathy   . Extended spectrum beta lactamase (ESBL) resistance   . Generalized anxiety disorder   . GERD (gastroesophageal reflux disease)   . Hemiplegia and hemiparesis following cerebral infarction affecting right dominant side ([place])   . Hiatal hernia   . HLD (hyperlipidemia)   . HTN (hypertension)   . Non-ST elevation (NSTEMI) myocardial infarction ([place])   . Nontraumatic intracerebral hemorrhage in hemisphere, subcortical ([place])   . Peripheral vascular disease  ([place])    a. Carotid dopplers neg 08/13/12. b. Pre-cabg ABIs - R=0.87 suggesting mild dz, L=1.29 possibly falsely elevated due to calcified vessels.  . Pleural effusion    a. L pleural eff after CABG s/p thoracentesis 08/22/12.  . Prostate cancer [facility])    Status post radiation treatment.  . Prostatic hypertrophy    a. Hx of urinary retention, awaiting TURP.  [name] Severe protein-calorie malnutrition ([place])   . Tobacco abuse   . 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 Valvular heart disease    a. Mild  MR by TEE 08/2012.     Assessment: 44 yoM admitted d/t extensive DVTs in RLE on dopplers. PMH includes AFib NOT on anticoagulation, cerebral infarction, DM, HTN, CAD.  Pharmacy consulted to start heparin infusion. CBC wnl  HL 0.60- therapeutic  Goal of Therapy:  Heparin level 0.3-0.7 units/ml Monitor platelets by anticoagulation protocol: Yes   Plan:  Continue heparin infusion at 1350 units/hr Check anti-Xa level in 8 hours and daily while on heparin Continue to monitor H&H and platelets  Margot Ables, PharmD Clinical Pharmacist 05/13/2021 8:57 AM

## 2021-05-13 NOTE — Progress Notes (Signed)
PROGRESS NOTE  [name] EYC:[id number] DOB: [birth year] DOA: 05/12/2021 PCP: [name], NP  Brief History:  75 year old male with a history of vascular dementia, postoperative atrial fibrillation, prior DVT, prostate cancer, hypertension, diabetes mellitus, hemorrhagic stroke January 2020 presenting from [facility] secondary to concerns for DVT.  His providers noted the patient to have right lower extremity edema and were concerned for possible DVT.  Therefore, he was sent to the emergency department for further evaluation.  The patient himself is a poor historian secondary to his dysphasia.  He is able to answer simple questions and identify himself.  There is been no recent injury or trauma or surgical intervention.  The patient himself denies any fevers, chills, chest pain, shortness of breath, nausea, vomiting, diarrhea, domino pain. In the emergency department, the patient was afebrile and hemodynamically stable with oxygen saturation 98% on room air.  BMP was unremarkable with his serum creatinine 1.98 which is near his baseline.  CBC was unremarkable.  Venous duplex of right lower extremity shows extensive DVT in the right common femoral vein extending to the calf veins.  The patient was started on IV heparin.  Assessment/Plan: Acute right lower extremity DVT -Confirmed by venous ultrasound -Concerned about impending phlegmasia cerulea Dolans -Case discussed with vascular surgery, Dr. Shary Key see in consult after transfer to [facility] -Continue IV heparin  Diabetes mellitus type 2, controlled -03/25/2021 hemoglobin A1c 6.6 -Continue NovoLog sliding scale -Start reduced dose Lantus  CKD stage IIIb -Baseline creatinine 1.6-1.8 -A.m. BMP  Essential hypertension -Continue amlodipine -Holding ARB temporarily  Hyperlipidemia -Continue statin  Coronary artery disease -No chest pain presently -On IV heparin presently -Continue Lipitor   Major  depression -Continue fluoxetine  Severe protein calorie malnutrition -Continue supplements  Chronic urinary retention -Continue Foley catheter -Continue Ditropan      Status is: Observation  The patient will require care spanning > 2 midnights and should be moved to inpatient because: Ongoing diagnostic testing needed not appropriate for outpatient work up  Dispo: The patient is from: Home              Anticipated d/c is to: Home              Patient currently is not medically stable to d/c.   Difficult to place patient No        Family Communication:   Spouse updated 6/2  Consultants:  Vascular surgery--Dr. Donzetta Matters  Code Status:  FULL   DVT Prophylaxis:  IV Heparin    Procedures: As Listed in Progress Note Above  Antibiotics: None      Subjective: Patient denies fevers, chills, headache, chest pain, dyspnea, nausea, vomiting, diarrhea, abdominal pain, dysuria, hematuria, hematochezia, and melena.   Objective: Vitals:   05/12/21 2105 05/12/21 2109 05/13/21 0236 05/13/21 0640  BP:  134/81 129/75 (!) 143/79  Pulse:  66 68 61  Resp:  20 18 16   Temp:  98.3 F (36.8 C) 97.6 F (36.4 C) (!) 97.5 F (36.4 C)  TempSrc:  Oral    SpO2:  98% 93% 95%  Weight: 78 kg     Height: 5\' 11"  (1.803 m)       Intake/Output Summary (Last 24 hours) at 05/13/2021 1497 Last data filed at 05/13/2021 0700 Gross per 24 hour  Intake --  Output 850 ml  Net -850 ml   Weight change:  Exam:   General:  Pt is alert, follows  commands appropriately, not in acute distress  HEENT: No icterus, No thrush, No neck mass, [place]/AT  Cardiovascular: RRR, S1/S2, no rubs, no gallops  Respiratory: bibasilar crackles. No wheeze  Abdomen: Soft/+BS, non tender, non distended, no guarding  Extremities: +RLE edema.  Cool right foot.  Could not palpate R-PT or R-DP pulse.  Right foot sensation and motor intact   Data Reviewed: I have personally reviewed following labs and imaging  studies Basic Metabolic Panel: Recent Labs  Lab 05/12/21 1611  NA 137  K 4.4  CL 103  CO2 25  GLUCOSE 233*  BUN 49*  CREATININE 1.98*  CALCIUM 8.2*   Liver Function Tests: No results for input(s): AST, ALT, ALKPHOS, BILITOT, PROT, ALBUMIN in the last 168 hours. No results for input(s): LIPASE, AMYLASE in the last 168 hours. No results for input(s): AMMONIA in the last 168 hours. Coagulation Profile: Recent Labs  Lab 05/12/21 2019  INR 1.1   CBC: Recent Labs  Lab 05/12/21 1611  WBC 10.6*  NEUTROABS 7.2  HGB 15.2  HCT 47.9  MCV 95.0  PLT 181   Cardiac Enzymes: No results for input(s): CKTOTAL, CKMB, CKMBINDEX, TROPONINI in the last 168 hours. BNP: Invalid input(s): POCBNP CBG: Recent Labs  Lab 05/12/21 2216  GLUCAP 123*   HbA1C: No results for input(s): HGBA1C in the last 72 hours. Urine analysis:    Component Value Date/Time   COLORURINE YELLOW 02/17/2021 1044   APPEARANCEUR CLOUDY (A) 02/17/2021 1044   LABSPEC 1.017 02/17/2021 1044   PHURINE 5.0 02/17/2021 1044   GLUCOSEU 50 (A) 02/17/2021 1044   HGBUR MODERATE (A) 02/17/2021 1044   BILIRUBINUR NEGATIVE 02/17/2021 1044   KETONESUR NEGATIVE 02/17/2021 1044   PROTEINUR 100 (A) 02/17/2021 1044   UROBILINOGEN 1.0 08/23/2012 1732   NITRITE POSITIVE (A) 02/17/2021 1044   LEUKOCYTESUR LARGE (A) 02/17/2021 1044   Sepsis Labs: @LABRCNTIP (procalcitonin:4,lacticidven:4) )No results found for this or any previous visit (from the past 240 hour(s)).   Scheduled Meds: . insulin aspart  0-15 Units Subcutaneous TID WC  . insulin aspart  0-5 Units Subcutaneous QHS   Continuous Infusions: . heparin 1,350 Units/hr (05/12/21 2210)    Procedures/Studies: US Venous Img Lower Right (DVT Study)  Result Date: 05/12/2021 CLINICAL DATA:  Right leg swelling. EXAM: RIGHT LOWER EXTREMITY VENOUS DOPPLER ULTRASOUND TECHNIQUE: Gray-scale sonography with compression, as well as color and duplex ultrasound, were performed to  evaluate the deep venous system(s) from the level of the common femoral vein through the popliteal and proximal calf veins. COMPARISON:  None. FINDINGS: VENOUS Extensive acute deep venous thrombosis extending from the right common femoral vein into the calf veins. Thrombus also involves the profundal femoral vein. Thrombus is nonocclusive in the right common femoral vein, but occlusive throughout the remaining deep veins of the right leg. Limited views of the contralateral common femoral vein are unremarkable. OTHER None. Limitations: None. IMPRESSION: 1. Extensive acute mostly occlusive deep venous thrombosis extending from the right common femoral vein into the calf veins. Electronically Signed   By: Titus Dubin M.D.   On: 05/12/2021 17:06    Orson Eva, DO  [facility]  If 7PM-7AM, please contact night-coverage [email or web address] Password [email or web address] 05/13/2021, 8:08 AM   LOS: 0 days

## 2021-05-14 DIAGNOSIS — I739 Peripheral vascular disease, unspecified: Secondary | ICD-10-CM | POA: Diagnosis not present

## 2021-05-14 DIAGNOSIS — I10 Essential (primary) hypertension: Secondary | ICD-10-CM | POA: Diagnosis not present

## 2021-05-14 DIAGNOSIS — I2510 Atherosclerotic heart disease of native coronary artery without angina pectoris: Secondary | ICD-10-CM | POA: Diagnosis not present

## 2021-05-14 DIAGNOSIS — I82411 Acute embolism and thrombosis of right femoral vein: Secondary | ICD-10-CM | POA: Diagnosis not present

## 2021-05-14 DIAGNOSIS — I82401 Acute embolism and thrombosis of unspecified deep veins of right lower extremity: Secondary | ICD-10-CM | POA: Diagnosis not present

## 2021-05-14 LAB — CBC
HCT: 43.7 % (ref 39.0–52.0)
Hemoglobin: 14.3 g/dL (ref 13.0–17.0)
MCH: 29.8 pg (ref 26.0–34.0)
MCHC: 32.7 g/dL (ref 30.0–36.0)
MCV: 91 fL (ref 80.0–100.0)
Platelets: 173 10*3/uL (ref 150–400)
RBC: 4.8 MIL/uL (ref 4.22–5.81)
RDW: 12.6 % (ref 11.5–15.5)
WBC: 7 10*3/uL (ref 4.0–10.5)
nRBC: 0 % (ref 0.0–0.2)

## 2021-05-14 LAB — BASIC METABOLIC PANEL
Anion gap: 4 — ABNORMAL LOW (ref 5–15)
BUN: 32 mg/dL — ABNORMAL HIGH (ref 8–23)
CO2: 27 mmol/L (ref 22–32)
Calcium: 8.2 mg/dL — ABNORMAL LOW (ref 8.9–10.3)
Chloride: 108 mmol/L (ref 98–111)
Creatinine, Ser: 1.68 mg/dL — ABNORMAL HIGH (ref 0.61–1.24)
GFR, Estimated: 42 mL/min — ABNORMAL LOW (ref >60)
Glucose, Bld: 116 mg/dL — ABNORMAL HIGH (ref 70–99)
Potassium: 4.1 mmol/L (ref 3.5–5.1)
Sodium: 139 mmol/L (ref 135–145)

## 2021-05-14 LAB — GLUCOSE, CAPILLARY
Glucose-Capillary: 159 mg/dL — ABNORMAL HIGH (ref 70–99)
Glucose-Capillary: 99 mg/dL (ref 70–99)
Glucose-Capillary: 184 mg/dL — ABNORMAL HIGH (ref 70–99)
Glucose-Capillary: 229 mg/dL — ABNORMAL HIGH (ref 70–99)
Glucose-Capillary: 185 mg/dL — ABNORMAL HIGH (ref 70–99)
Glucose-Capillary: 100 mg/dL — ABNORMAL HIGH (ref 70–99)

## 2021-05-14 LAB — HEPARIN LEVEL (UNFRACTIONATED): Heparin Unfractionated: 0.63 IU/mL (ref 0.30–0.70)

## 2021-05-14 NOTE — Consult Note (Signed)
Hospital Consult    Reason for Consult: Right lower extremity DVT Referring Physician: Dr. Avon Gully MRN #:  [id number]  History of Present Illness: This is a 75 y.o. male history of stroke 2 and half years ago.  He was staying at the [facility] there was concern for right lower extremity swelling he was sent to [facility], The was found to have extensive right lower extremity DVT.  All history obtained from wife at the bedside.  She tells me that patient does not move his right side requires two-person movement.  He does not walk.  He does not have any personal or family history of DVT.  Past Medical History:  Diagnosis Date  . A-fib ([place])    a. Post-op afib after CABG 08/2012.  [name] Acute gastric ulcer with hemorrhage   . Acute respiratory failure with hypoxia ([place])   . Aphasia following cerebral infarction   . Atrial fibrillation ([place])   . CAD (coronary artery disease)    a. NSTEMI s/p stent to distal RCA 03/2000. b. Inf MI s/p emergent thrombectomy/stenting mid RCA 10/2000. c. NSTEMI s/p CABGx4 (LIMA-LAD, SVG-OM2, seq SVG-acute marginal and PD) 07/13/43 - course complicated by confusion, post-op AF, L pleural effusion with thoracentesis. d. Normal LV function by echo 08/2012.  . Diabetes mellitus   . Diverticulosis   . Dysphagia following cerebral infarction   . Encephalopathy   . Extended spectrum beta lactamase (ESBL) resistance   . Generalized anxiety disorder   . GERD (gastroesophageal reflux disease)   . Hemiplegia and hemiparesis following cerebral infarction affecting right dominant side ([place])   . Hiatal hernia   . HLD (hyperlipidemia)   . HTN (hypertension)   . Non-ST elevation (NSTEMI) myocardial infarction ([place])   . Nontraumatic intracerebral hemorrhage in hemisphere, subcortical ([place])   . Peripheral vascular disease ([place])    a. Carotid dopplers neg 08/13/12. b. Pre-cabg ABIs - R=0.87 suggesting mild dz, L=1.29 possibly falsely elevated due to calcified vessels.  . Pleural  effusion    a. L pleural eff after CABG s/p thoracentesis 08/22/12.  . Prostate cancer [facility])    Status post radiation treatment.  . Prostatic hypertrophy    a. Hx of urinary retention, awaiting TURP.  [name] Severe protein-calorie malnutrition ([place])   . Tobacco abuse   . 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 Valvular heart disease    a. Mild  MR by TEE 08/2012.    Past Surgical History:  Procedure Laterality Date  . APPENDECTOMY    . BACK SURGERY    . CORONARY ARTERY BYPASS GRAFT  08/16/2012   Procedure: CORONARY ARTERY BYPASS GRAFTING (CABG);  Surgeon: Melrose Nakayama, MD;  Location: [place];  Service: Open Heart Surgery;  Laterality: N/A;  . IR REPLC GASTRO/COLONIC TUBE PERCUT W/FLUORO  02/13/2019  . LEFT HEART CATHETERIZATION WITH CORONARY ANGIOGRAM N/A 08/14/2012   Procedure: LEFT HEART CATHETERIZATION WITH CORONARY ANGIOGRAM;  Surgeon: Peter M Martinique, MD;  Location: [facility] CATH LAB;  Service: Cardiovascular;  Laterality: N/A;    No Known Allergies  Prior to Admission medications   Medication Sig Start Date End Date Taking? Authorizing Provider  acetaminophen (TYLENOL) 325 MG tablet Take 650 mg by mouth every 4 (four) hours as needed. per standing order for pain/fever DO NOT EXCEED >3,000 mg in 24 hours 06/16/19  Yes [provider]  amLODipine (NORVASC) 5 MG tablet Take 5 mg by mouth daily.    Yes [provider]  atorvastatin (LIPITOR) 40  MG tablet Take 40 mg by mouth at bedtime.  01/29/19  Yes [provider]  baclofen (LIORESAL) 10 MG tablet Take 1 tablet by mouth 2 (two) times daily. Hold for sedation 04/03/19  Yes [provider]  Janne Lab Oil [facility]) OINT Apply topically. Special Instructions: Apply to sacrum, coccyx and bilateral buttocks qshift for prevention/ redness. Every Shift Day, Evening, Night 01/24/21  Yes [provider]  Eyelid Cleansers (OCUSOFT LID SCRUB EX) daily. Cleanse both eye lids   Yes  [provider]  FLUoxetine (PROZAC) 20 MG/5ML solution Take 10 mg by mouth daily. 08/21/20  Yes [provider]  Glucerna (GLUCERNA) LIQD Take 237 mLs by mouth daily.   Yes [provider]  Insulin Glargine (LANTUS SOLOSTAR) 100 UNIT/ML Solostar Pen Inject 20 Units into the skin daily.   Yes [provider]  insulin lispro (HUMALOG) 100 UNIT/ML cartridge Inject 10 Units into the skin 3 (three) times daily before meals. Give 10 units SQ with meals if Blood Sugar >150   Yes [provider]  lansoprazole (PREVACID SOLUTAB) 30 MG disintegrating tablet Take 30 mg by mouth daily. Give before meals 04/03/19  Yes [provider]  loperamide (IMODIUM A-D) 2 MG tablet Take 2 mg by mouth as needed for diarrhea or loose stools. 12/19/20  Yes [provider]  Melatonin 1 MG TABS Take 2 tablets by mouth at bedtime.  01/30/19  Yes [provider]  oxybutynin (DITROPAN XL) 15 MG 24 hr tablet Take 15 mg by mouth daily.   Yes [provider]  polyethylene glycol (MIRALAX / GLYCOLAX) 17 g packet Take 17 g by mouth daily as needed. 08/24/20  Yes [provider]  valsartan (DIOVAN) 80 MG tablet Take 80 mg by mouth daily.   Yes [provider]  hydrocortisone cream 1 % Apply rectally twice daily and as needed for hemorrhoids Patient not taking: Reported on 05/12/2021 01/12/19   [provider]  NON FORMULARY Diet Change: Regular, thin liquids Patient not taking: Reported on 05/12/2021 06/21/19   [provider]    Social History   Socioeconomic History  . Marital status: Married    Spouse name: Not on file  . Number of children: Not on file  . Years of education: Not on file  . Highest education level: Not on file  Occupational History  . Occupation: retired   Tobacco Use  . Smoking status: Former Smoker    Quit date: 11/12/1972    Years since quitting: 48.5  . Smokeless tobacco: Never Used  Vaping Use   . Vaping Use: Never used  Substance and Sexual Activity  . Alcohol use: No  . Drug use: No  . Sexual activity: Not Currently  Other Topics Concern  . Not on file  Social History Narrative   He is a long term patient of [facility]    Social Determinants of Health   Financial Resource Strain: Not on file  Food Insecurity: Not on file  Transportation Needs: Not on file  Physical Activity: Not on file  Stress: Not on file  Social Connections: Not on file  Intimate Partner Violence: Not on file     Family History  Problem Relation Age of Onset  . Hypertension Mother     ROS: Unable to obtain as patient is mostly nonverbal  Physical Examination  Vitals:   05/14/21 0419 05/14/21 0804  BP: 131/74 130/68  Pulse: 60 67  Resp: 16 19  Temp: 98.2 F (  36.8 C) 98 F (36.7 C)  SpO2: 97% 95%   Body mass index is 24.78 kg/m.  General:  nad HENT: WNL, normocephalic Pulmonary: normal non-labored breathing Cardiac: palpable dp bilaterally Abdomen: soft, NT/ND, no masses Extremities: minimal edema rle  Neurologic: awake and alert   CBC    Component Value Date/Time   WBC 7.0 05/14/2021 0501   RBC 4.80 05/14/2021 0501   HGB 14.3 05/14/2021 0501   HCT 43.7 05/14/2021 0501   PLT 173 05/14/2021 0501   MCV 91.0 05/14/2021 0501   MCH 29.8 05/14/2021 0501   MCHC 32.7 05/14/2021 0501   RDW 12.6 05/14/2021 0501   LYMPHSABS 2.0 05/12/2021 1611   MONOABS 0.8 05/12/2021 1611   EOSABS 0.4 05/12/2021 1611   BASOSABS 0.1 05/12/2021 1611    BMET    Component Value Date/Time   NA 139 05/13/2021 0645   K 4.0 05/13/2021 0645   CL 103 05/13/2021 0645   CO2 30 05/13/2021 0645   GLUCOSE 110 (H) 05/13/2021 0645   BUN 41 (H) 05/13/2021 0645   CREATININE 1.80 (H) 05/13/2021 0645   CALCIUM 8.5 (L) 05/13/2021 0645   GFRNONAA 39 (L) 05/13/2021 0645   GFRAA 56 (L) 05/25/2020 0740    COAGS: Lab Results  Component Value Date   INR 1.1 05/12/2021   INR 1.54 (H) 08/16/2012   INR 1.18  08/15/2012     Non-Invasive Vascular Imaging:   Venous duplex IMPRESSION: 1. Extensive acute mostly occlusive deep venous thrombosis extending from the right common femoral vein into the calf veins.    ASSESSMENT/PLAN: This is a 75 y.o. male history of extensive right lower extremity DVT diagnosed yesterday at [facility].  Fortunately patient swelling appears to be resolving.  He is okay for anticoagulation and also gentle compression socks.  I do not think patient would benefit from any intervention given that he is nonambulatory and his right lower extremity is not mobile.  I discussed with the wife that he may have swelling and definitely of the right lower extremity and she demonstrates good understanding.  He can see me on an as-needed basis.  Elina Streng C. Donzetta Matters, MD [facility] Office: [phone] Pager: [phone]

## 2021-05-14 NOTE — Progress Notes (Signed)
ANTICOAGULATION CONSULT NOTE -  Pharmacy Consult for heparin Indication: RLE DVT  No Known Allergies  Patient Measurements: Height: 5\' 11"  (180.3 cm) Weight: 80.6 kg (177 lb 11.1 oz) IBW/kg (Calculated) : 75.3 Heparin Dosing Weight: 78 kg  Vital Signs: Temp: 98 F (36.7 C) (06/03 0804) Temp Source: Oral (06/03 0804) BP: 130/68 (06/03 0804) Pulse Rate: 67 (06/03 0804)  Labs: Recent Labs    05/12/21 1611 05/12/21 2019 05/12/21 2019 05/13/21 0645 05/13/21 2131 05/14/21 0501  HGB 15.2  --   --  15.0  --  14.3  HCT 47.9  --   --  47.6  --  43.7  PLT 181  --   --  201  --  173  APTT  --  32  --   --   --   --   LABPROT  --  14.3  --   --   --   --   INR  --  1.1  --   --   --   --   HEPARINUNFRC  --  <0.10*   < > 0.60 0.57 0.63  CREATININE 1.98*  --   --  1.80*  --  1.68*   < > = values in this interval not displayed.    Estimated Creatinine Clearance: 39.8 mL/min (A) (by C-G formula based on SCr of 1.68 mg/dL (H)).   Medical History: Past Medical History:  Diagnosis Date  . A-fib ([place])    a. Post-op afib after CABG 08/2012.  [name] Acute gastric ulcer with hemorrhage   . Acute respiratory failure with hypoxia ([place])   . Aphasia following cerebral infarction   . Atrial fibrillation ([place])   . CAD (coronary artery disease)    a. NSTEMI s/p stent to distal RCA 03/2000. b. Inf MI s/p emergent thrombectomy/stenting mid RCA 10/2000. c. NSTEMI s/p CABGx4 (LIMA-LAD, SVG-OM2, seq SVG-acute marginal and PD) 03/15/80 - course complicated by confusion, post-op AF, L pleural effusion with thoracentesis. d. Normal LV function by echo 08/2012.  . Diabetes mellitus   . Diverticulosis   . Dysphagia following cerebral infarction   . Encephalopathy   . Extended spectrum beta lactamase (ESBL) resistance   . Generalized anxiety disorder   . GERD (gastroesophageal reflux disease)   . Hemiplegia and hemiparesis following cerebral infarction affecting right dominant side ([place])   . Hiatal hernia    . HLD (hyperlipidemia)   . HTN (hypertension)   . Non-ST elevation (NSTEMI) myocardial infarction ([place])   . Nontraumatic intracerebral hemorrhage in hemisphere, subcortical ([place])   . Peripheral vascular disease ([place])    a. Carotid dopplers neg 08/13/12. b. Pre-cabg ABIs - R=0.87 suggesting mild dz, L=1.29 possibly falsely elevated due to calcified vessels.  . Pleural effusion    a. L pleural eff after CABG s/p thoracentesis 08/22/12.  . Prostate cancer [facility])    Status post radiation treatment.  . Prostatic hypertrophy    a. Hx of urinary retention, awaiting TURP.  [name] Severe protein-calorie malnutrition ([place])   . Tobacco abuse   . 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 Valvular heart disease    a. Mild  MR by TEE 08/2012.     Assessment: 40 yoM admitted d/t extensive DVTs in RLE on dopplers. PMH includes AFib NOT on anticoagulation, cerebral infarction, DM, HTN, CAD, h/o prior DVT.   Pharmacy consulted 6/2 to start heparin infusion. CBC wnl  HL 0.63- therapeutic heparin level on heparin drip 1350 units/hr.  No acute  bleeding reported.  Goal of Therapy:  Heparin level 0.3-0.7 units/ml Monitor platelets by anticoagulation protocol: Yes   Plan:  Continue heparin infusion at 1350 units/hr Check anti-Xa level daily while on heparin Continue to monitor H&H and platelets   Nicole Cella, RPh Clinical Pharmacist [phone] Please check AMION for all [facility] [place] phone numbers After 10:00 PM, call [place] [phone] 05/14/2021 11:51 AM

## 2021-05-14 NOTE — Progress Notes (Signed)
PROGRESS NOTE    [name]  NLG:[id number] DOB: 1946-04-29 DOA: 05/12/2021 PCP: [name], NP   Brief Narrative:  75 year old male with a history of vascular dementia, postoperative atrial fibrillation, prior DVT, prostate cancer, hypertension, diabetes mellitus, hemorrhagic stroke January 2020 presenting from [facility] secondary to concerns for DVT.  The patient himself is a poor historian secondary to his dysphasia. Venous duplex of right lower extremity shows extensive DVT in the right common femoral vein extending to the calf veins.  The patient was started on IV heparin with plan to bridge to oral anticoagulation  Assessment & Plan:   Principal Problem:   Right leg DVT ([place]) Active Problems:   HTN (hypertension)   CAD (coronary artery disease)   Peripheral vascular disease ([facility])  Acute right lower extremity DVT -Confirmed by venous ultrasound -somewhat extensive from right common femoral vein into the calf -Vascular surgery following, appreciate insight and recommendations -supportive care for now, anticoagulation ongoing.  Unless clinically changes will continue to transition over to p.o. anticoagulation in the next 24 to 48 hours -Continue IV heparin  Diabetes mellitus type 2, controlled -03/25/2021 hemoglobin A1c 6.6 -Continue NovoLog sliding scale -Start reduced dose Lantus  CKD stage IIIb -Baseline creatinine 1.6-1.8 -A.m. BMP  Essential hypertension -Continue amlodipine -Holding ARB temporarily  Hyperlipidemia -Continue statin  Coronary artery disease -No chest pain presently -On IV heparin presently -Continue Lipitor   Major depression -Continue fluoxetine  Severe protein calorie malnutrition -Continue supplements  Chronic urinary retention -Continue Foley catheter -Continue Ditropan  DVT prophylaxis: Heparin drip Code Status: Full Family Communication: Wife at bedside  Status is: Inpatient  Dispo: The patient is from:  Facility              Anticipated d/c is to: Same              Anticipated d/c date is: 24 to 48 hours              Patient currently not medically stable for discharge  Consultants:   Vascular surgery  Procedures:   None planned  Antimicrobials:  None indicated  Subjective: No acute issues or events overnight, patient's dementia/history of stroke makes review of systems somewhat difficult but appears well, wife at bedside states he appears to be at baseline mentally.  Objective: Vitals:   05/13/21 2120 05/13/21 2352 05/14/21 0419 05/14/21 0804  BP: (!) 150/79 119/74 131/74 130/68  Pulse:  60 60 67  Resp: 19 16 16 19   Temp: 98.5 F (36.9 C) 98.5 F (36.9 C) 98.2 F (36.8 C) 98 F (36.7 C)  TempSrc: Oral Oral Oral Oral  SpO2: 95% 93% 97% 95%  Weight: 80.6 kg     Height:        Intake/Output Summary (Last 24 hours) at 05/14/2021 0813 Last data filed at 05/14/2021 0648 Gross per 24 hour  Intake 2707.52 ml  Output 1741 ml  Net 966.52 ml   Filed Weights   05/12/21 1458 05/12/21 2105 05/13/21 2120  Weight: 78 kg 78 kg 80.6 kg    Examination:  General:  Pleasantly resting in bed, No acute distress. HEENT:  Normocephalic atraumatic.  Sclerae nonicteric, noninjected.  Extraocular movements intact bilaterally. Neck:  Without mass or deformity.  Trachea is midline. Lungs:  Clear to auscultate bilaterally without rhonchi, wheeze, or rales. Heart:  Regular rate and rhythm.  Without murmurs, rubs, or gallops. Abdomen:  Soft, nontender, nondistended.  Without guarding or rebound GU: Moderately dilated penile  meatus likely in the setting of chronic Foley use and malpositioning.  No skin tearing, infection or bleeding noted. Extremities: Right lower extremity taut skin below the knee with 2+ pitting edema Vascular:  Dorsalis pedis and posterior tibial pulses palpable bilaterally. Skin:  Warm and dry, no erythema, no ulcerations.   Data Reviewed: I have personally reviewed  following labs and imaging studies  CBC: Recent Labs  Lab 05/12/21 1611 05/13/21 0645 05/14/21 0501  WBC 10.6* 7.6 7.0  NEUTROABS 7.2  --   --   HGB 15.2 15.0 14.3  HCT 47.9 47.6 43.7  MCV 95.0 93.9 91.0  PLT 181 201 878   Basic Metabolic Panel: Recent Labs  Lab 05/12/21 1611 05/13/21 0645  NA 137 139  K 4.4 4.0  CL 103 103  CO2 25 30  GLUCOSE 233* 110*  BUN 49* 41*  CREATININE 1.98* 1.80*  CALCIUM 8.2* 8.5*   GFR: Estimated Creatinine Clearance: 37.2 mL/min (A) (by C-G formula based on SCr of 1.8 mg/dL (H)). Liver Function Tests: No results for input(s): AST, ALT, ALKPHOS, BILITOT, PROT, ALBUMIN in the last 168 hours. No results for input(s): LIPASE, AMYLASE in the last 168 hours. No results for input(s): AMMONIA in the last 168 hours. Coagulation Profile: Recent Labs  Lab 05/12/21 2019  INR 1.1   Cardiac Enzymes: No results for input(s): CKTOTAL, CKMB, CKMBINDEX, TROPONINI in the last 168 hours. BNP (last 3 results) No results for input(s): PROBNP in the last 8760 hours. HbA1C: No results for input(s): HGBA1C in the last 72 hours. CBG: Recent Labs  Lab 05/13/21 0824 05/13/21 1108 05/13/21 2004 05/13/21 2126 05/14/21 0631  GLUCAP 119* 227* 121* 88 99   Lipid Profile: No results for input(s): CHOL, HDL, LDLCALC, TRIG, CHOLHDL, LDLDIRECT in the last 72 hours. Thyroid Function Tests: No results for input(s): TSH, T4TOTAL, FREET4, T3FREE, THYROIDAB in the last 72 hours. Anemia Panel: No results for input(s): VITAMINB12, FOLATE, FERRITIN, TIBC, IRON, RETICCTPCT in the last 72 hours. Sepsis Labs: No results for input(s): PROCALCITON, LATICACIDVEN in the last 168 hours.  Recent Results (from the past 240 hour(s))  SARS CORONAVIRUS 2 (TAT 6-24 HRS) Nasopharyngeal Nasopharyngeal Swab     Status: None   Collection Time: 05/12/21  7:43 PM   Specimen: Nasopharyngeal Swab  Result Value Ref Range Status   SARS Coronavirus 2 NEGATIVE NEGATIVE Final     Comment: (NOTE) SARS-CoV-2 target nucleic acids are NOT DETECTED.  The SARS-CoV-2 RNA is generally detectable in upper and lower respiratory specimens during the acute phase of infection. Negative results do not preclude SARS-CoV-2 infection, do not rule out co-infections with other pathogens, and should not be used as the sole basis for treatment or other patient management decisions. Negative results must be combined with clinical observations, patient history, and epidemiological information. The expected result is Negative.  Fact Sheet for Patients: SugarRoll.be  Fact Sheet for Healthcare Providers: https://www.woods-mathews.com/  This test is not yet approved or cleared by the [place] FDA and  has been authorized for detection and/or diagnosis of SARS-CoV-2 by FDA under an Emergency Use Authorization (EUA). This EUA will remain  in effect (meaning this test can be used) for the duration of the COVID-19 declaration under Se ction 564(b)(1) of the Act, 21 U.S.C. section 360bbb-3(b)(1), unless the authorization is terminated or revoked sooner.  Performed at [facility], [place] [address] [place], [place] [address]          Radiology Studies: US Venous Img  Lower Right (DVT Study)  Result Date: 05/12/2021 CLINICAL DATA:  Right leg swelling. EXAM: RIGHT LOWER EXTREMITY VENOUS DOPPLER ULTRASOUND TECHNIQUE: Gray-scale sonography with compression, as well as color and duplex ultrasound, were performed to evaluate the deep venous system(s) from the level of the common femoral vein through the popliteal and proximal calf veins. COMPARISON:  None. FINDINGS: VENOUS Extensive acute deep venous thrombosis extending from the right common femoral vein into the calf veins. Thrombus also involves the profundal femoral vein. Thrombus is nonocclusive in the right common femoral vein, but occlusive throughout the remaining deep veins of the right  leg. Limited views of the contralateral common femoral vein are unremarkable. OTHER None. Limitations: None. IMPRESSION: 1. Extensive acute mostly occlusive deep venous thrombosis extending from the right common femoral vein into the calf veins. Electronically Signed   By: Titus Dubin M.D.   On: 05/12/2021 17:06        Scheduled Meds: . amLODipine  5 mg Oral Daily  . atorvastatin  40 mg Oral QHS  . Chlorhexidine Gluconate Cloth  6 each Topical Daily  . feeding supplement (GLUCERNA SHAKE)  237 mL Oral Daily  . FLUoxetine  10 mg Oral Daily  . insulin aspart  0-15 Units Subcutaneous TID WC  . insulin aspart  0-5 Units Subcutaneous QHS  . insulin glargine  10 Units Subcutaneous Daily  . melatonin  3 mg Oral QHS  . oxybutynin  15 mg Oral Daily   Continuous Infusions: . sodium chloride 75 mL/hr at 05/13/21 2138  . heparin 1,350 Units/hr (05/13/21 1713)     LOS: 0 days   Time spent: 35min  Parv Manthey C Ayame Rena, DO [facility]  If 7PM-7AM, please contact night-coverage [email or web address]  05/14/2021, 8:13 AM

## 2021-05-15 ENCOUNTER — Inpatient Hospital Stay
Admission: RE | Admit: 2021-05-15 | Discharge: 2021-08-09 | Disposition: A | Discharge status: Skilled Nursing Facility | Payer: Medicare PPO | Source: Ambulatory Visit | Attending: Internal Medicine | Admitting: Internal Medicine

## 2021-05-15 DIAGNOSIS — G8190 Hemiplegia, unspecified affecting unspecified side: Secondary | ICD-10-CM | POA: Diagnosis not present

## 2021-05-15 DIAGNOSIS — R410 Disorientation, unspecified: Secondary | ICD-10-CM | POA: Diagnosis not present

## 2021-05-15 DIAGNOSIS — I739 Peripheral vascular disease, unspecified: Secondary | ICD-10-CM | POA: Diagnosis not present

## 2021-05-15 DIAGNOSIS — I82401 Acute embolism and thrombosis of unspecified deep veins of right lower extremity: Secondary | ICD-10-CM | POA: Diagnosis not present

## 2021-05-15 DIAGNOSIS — Z743 Need for continuous supervision: Secondary | ICD-10-CM | POA: Diagnosis not present

## 2021-05-15 DIAGNOSIS — R0902 Hypoxemia: Secondary | ICD-10-CM | POA: Diagnosis not present

## 2021-05-15 DIAGNOSIS — R531 Weakness: Secondary | ICD-10-CM | POA: Diagnosis not present

## 2021-05-15 DIAGNOSIS — I2510 Atherosclerotic heart disease of native coronary artery without angina pectoris: Secondary | ICD-10-CM | POA: Diagnosis not present

## 2021-05-15 DIAGNOSIS — I10 Essential (primary) hypertension: Secondary | ICD-10-CM | POA: Diagnosis not present

## 2021-05-15 DIAGNOSIS — I82411 Acute embolism and thrombosis of right femoral vein: Secondary | ICD-10-CM | POA: Diagnosis not present

## 2021-05-15 LAB — CBC
HCT: 44.6 % (ref 39.0–52.0)
Hemoglobin: 14.7 g/dL (ref 13.0–17.0)
MCH: 29.6 pg (ref 26.0–34.0)
MCHC: 33 g/dL (ref 30.0–36.0)
MCV: 89.7 fL (ref 80.0–100.0)
Platelets: 182 10*3/uL (ref 150–400)
RBC: 4.97 MIL/uL (ref 4.22–5.81)
RDW: 12.4 % (ref 11.5–15.5)
WBC: 7.5 10*3/uL (ref 4.0–10.5)
nRBC: 0 % (ref 0.0–0.2)

## 2021-05-15 LAB — HEPARIN LEVEL (UNFRACTIONATED): Heparin Unfractionated: 0.45 IU/mL (ref 0.30–0.70)

## 2021-05-15 LAB — GLUCOSE, CAPILLARY
Glucose-Capillary: 100 mg/dL — ABNORMAL HIGH (ref 70–99)
Glucose-Capillary: 193 mg/dL — ABNORMAL HIGH (ref 70–99)
Glucose-Capillary: 139 mg/dL — ABNORMAL HIGH (ref 70–99)

## 2021-05-15 MED ORDER — APIXABAN 5 MG PO TABS
10.0000 mg | ORAL_TABLET | Freq: Two times a day (BID) | ORAL | Status: DC
  Administered 2021-05-15: 10 mg via ORAL
  Filled 2021-05-15: qty 2

## 2021-05-15 MED ORDER — APIXABAN 5 MG PO TABS: 5.0000 mg | ORAL_TABLET | Freq: Two times a day (BID) | ORAL | 0 refills | Status: DC

## 2021-05-15 MED ORDER — APIXABAN 5 MG PO TABS: 10.0000 mg | ORAL_TABLET | Freq: Two times a day (BID) | ORAL | 0 refills | Status: DC

## 2021-05-15 MED ORDER — APIXABAN 5 MG PO TABS: 5.0000 mg | ORAL_TABLET | Freq: Two times a day (BID) | ORAL | Status: DC

## 2021-05-15 NOTE — Progress Notes (Signed)
ANTICOAGULATION CONSULT NOTE -  Pharmacy Consult for heparin to apixaban Indication: RLE DVT  No Known Allergies  Patient Measurements: Height: 5\' 11"  (180.3 cm) Weight: 80.6 kg (177 lb 11.1 oz) IBW/kg (Calculated) : 75.3 Heparin Dosing Weight: 78 kg  Vital Signs: Temp: 98.5 F (36.9 C) (06/04 0514) Temp Source: Oral (06/04 0514) BP: 121/70 (06/04 0514) Pulse Rate: 65 (06/04 0514)  Labs: Recent Labs    05/12/21 1611 05/12/21 1611 05/12/21 2019 05/13/21 0645 05/13/21 2131 05/14/21 0501 05/15/21 0135  HGB 15.2  --   --  15.0  --  14.3 14.7  HCT 47.9  --   --  47.6  --  43.7 44.6  PLT 181  --   --  201  --  173 182  APTT  --   --  32  --   --   --   --   LABPROT  --   --  14.3  --   --   --   --   INR  --   --  1.1  --   --   --   --   HEPARINUNFRC  --    < > <0.10* 0.60 0.57 0.63 0.45  CREATININE 1.98*  --   --  1.80*  --  1.68*  --    < > = values in this interval not displayed.    Estimated Creatinine Clearance: 39.8 mL/min (A) (by C-G formula based on SCr of 1.68 mg/dL (H)).   Medical History: Past Medical History:  Diagnosis Date  . A-fib ([place])    a. Post-op afib after CABG 08/2012.  [name] Acute gastric ulcer with hemorrhage   . Acute respiratory failure with hypoxia ([place])   . Aphasia following cerebral infarction   . Atrial fibrillation ([place])   . CAD (coronary artery disease)    a. NSTEMI s/p stent to distal RCA 03/2000. b. Inf MI s/p emergent thrombectomy/stenting mid RCA 10/2000. c. NSTEMI s/p CABGx4 (LIMA-LAD, SVG-OM2, seq SVG-acute marginal and PD) 08/14/22 - course complicated by confusion, post-op AF, L pleural effusion with thoracentesis. d. Normal LV function by echo 08/2012.  . Diabetes mellitus   . Diverticulosis   . Dysphagia following cerebral infarction   . Encephalopathy   . Extended spectrum beta lactamase (ESBL) resistance   . Generalized anxiety disorder   . GERD (gastroesophageal reflux disease)   . Hemiplegia and hemiparesis following  cerebral infarction affecting right dominant side ([place])   . Hiatal hernia   . HLD (hyperlipidemia)   . HTN (hypertension)   . Non-ST elevation (NSTEMI) myocardial infarction ([place])   . Nontraumatic intracerebral hemorrhage in hemisphere, subcortical ([place])   . Peripheral vascular disease ([place])    a. Carotid dopplers neg 08/13/12. b. Pre-cabg ABIs - R=0.87 suggesting mild dz, L=1.29 possibly falsely elevated due to calcified vessels.  . Pleural effusion    a. L pleural eff after CABG s/p thoracentesis 08/22/12.  . Prostate cancer [facility])    Status post radiation treatment.  . Prostatic hypertrophy    a. Hx of urinary retention, awaiting TURP.  [name] Severe protein-calorie malnutrition ([place])   . Tobacco abuse   . 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 Valvular heart disease    a. Mild  MR by TEE 08/2012.     Assessment: 54 yoM admitted d/t extensive DVTs in RLE on dopplers. PMH includes AFib NOT on anticoagulation, cerebral infarction, DM, HTN, CAD, h/o prior DVT.   Pharmacy consulted 6/2  to start heparin infusion, now switching to apixaban. CBC stable, no s/sx bleeding noted.   Goal of Therapy:  Monitor platelets by anticoagulation protocol: Yes   Plan:  - D/c heparin infusion - Initiate apixaban 10mg  twice daily for 7 days, then continue 5mg  twice daily - Monitor CBC and s/sx bleeding  Mercy Riding, PharmD PGY1 Acute Care Pharmacy Resident Please refer to [facility] for unit-specific pharmacist

## 2021-05-15 NOTE — Discharge Summary (Signed)
Physician Discharge Summary  JACOBS GOLAB XBD:[id number] DOB: February 23, 1946 DOA: 05/12/2021  PCP: [name], NP  Admit date: 05/12/2021 Discharge date: 05/15/2021  Admitted From: Facility Disposition: Same  Recommendations for Outpatient Follow-up:  1. Follow up with PCP in 1-2 weeks 2. Please obtain BMP/CBC in one week  Home Health: None Equipment/Devices: None none  Discharge Condition: Stable CODE STATUS: Full Diet recommendation: As tolerated diabetic diet  Brief/Interim Summary: 75 year old male with a history of vascular dementia, postoperative atrial fibrillation, prior DVT, prostate cancer, hypertension, diabetes mellitus, [facility].The patient himself is a poor historian secondary to his dysphasia. Venous duplex of right lower extremity shows extensive DVT in the right common femoral vein extending to the calf veins. The patient was started on IV heparin with plan to bridge to oral anticoagulation and get vascular evaluation for large DVT burden.  Patient is above with right lower extremity swelling and pain with large DVT burden on ultrasound.  Vascular surgery was consulted, at this time given patient's resolution of symptoms tolerating anticoagulation quite well no further indication for intervention or imaging.  We will transition patient off of heparin drip onto Eliquis, 10 mg twice daily for 6 additional days and transition to 5 mg p.o. twice daily.  He will need routine follow-up in 3 to 6 months time for reevaluation to ensure resolution of DVT, at that time discontinuation of anticoagulation could be considered.  Discharge Diagnoses:  Principal Problem:   Right leg DVT ([place]) Active Problems:   HTN (hypertension)   CAD (coronary artery disease)   Peripheral vascular disease [facility])    Discharge Instructions  Discharge Instructions    Call MD for:  extreme fatigue   Complete by:  As directed    Call MD for:  temperature >100.4   Complete by: As directed    Diet - low sodium heart healthy   Complete by: As directed    Increase activity slowly   Complete by: As directed      Allergies as of 05/15/2021   No Known Allergies     Medication List    TAKE these medications   acetaminophen 325 MG tablet Commonly known as: TYLENOL Take 650 mg by mouth every 4 (four) hours as needed. per standing order for pain/fever DO NOT EXCEED >3,000 mg in 24 hours   amLODipine 5 MG tablet Commonly known as: NORVASC Take 5 mg by mouth daily.   apixaban 5 MG Tabs tablet Commonly known as: ELIQUIS Take 2 tablets (10 mg total) by mouth 2 (two) times daily for 6 days.   apixaban 5 MG Tabs tablet Commonly known as: ELIQUIS Take 1 tablet (5 mg total) by mouth 2 (two) times daily. Start taking on: May 22, 2021   atorvastatin 40 MG tablet Commonly known as: LIPITOR Take 40 mg by mouth at bedtime.   baclofen 10 MG tablet Commonly known as: LIORESAL Take 1 tablet by mouth 2 (two) times daily. Hold for sedation   FLUoxetine 20 MG/5ML solution Commonly known as: PROZAC Take 10 mg by mouth daily.   Glucerna Liqd Take 237 mLs by mouth daily.   HumaLOG 100 UNIT/ML cartridge Generic drug: insulin lispro Inject 10 Units into the skin 3 (three) times daily before meals. Give 10 units SQ with meals if Blood Sugar >150   hydrocortisone cream 1 % Apply rectally twice daily and as needed for hemorrhoids   lansoprazole 30 MG disintegrating tablet Commonly  known as: PREVACID SOLUTAB Take 30 mg by mouth daily. Give before meals   Lantus SoloStar 100 UNIT/ML Solostar Pen Generic drug: insulin glargine Inject 20 Units into the skin daily.   loperamide 2 MG tablet Commonly known as: IMODIUM A-D Take 2 mg by mouth as needed for diarrhea or loose stools.   melatonin 1 MG Tabs tablet Take 2 tablets by mouth at bedtime.   NON FORMULARY Diet Change: Regular, thin liquids    OCUSOFT LID SCRUB EX daily. Cleanse both eye lids   oxybutynin 15 MG 24 hr tablet Commonly known as: DITROPAN XL Take 15 mg by mouth daily.   polyethylene glycol 17 g packet Commonly known as: MIRALAX / GLYCOLAX Take 17 g by mouth daily as needed.   valsartan 80 MG tablet Commonly known as: DIOVAN Take 80 mg by mouth daily.   Venelex Oint Apply topically. Special Instructions: Apply to sacrum, coccyx and bilateral buttocks qshift for prevention/ redness. Every Shift Day, Evening, Night       No Known Allergies  Consultations:  Vascular surgery   Procedures/Studies: US Venous Img Lower Right (DVT Study)  Result Date: 05/12/2021 CLINICAL DATA:  Right leg swelling. EXAM: RIGHT LOWER EXTREMITY VENOUS DOPPLER ULTRASOUND TECHNIQUE: Gray-scale sonography with compression, as well as color and duplex ultrasound, were performed to evaluate the deep venous system(s) from the level of the common femoral vein through the popliteal and proximal calf veins. COMPARISON:  None. FINDINGS: VENOUS Extensive acute deep venous thrombosis extending from the right common femoral vein into the calf veins. Thrombus also involves the profundal femoral vein. Thrombus is nonocclusive in the right common femoral vein, but occlusive throughout the remaining deep veins of the right leg. Limited views of the contralateral common femoral vein are unremarkable. OTHER None. Limitations: None. IMPRESSION: 1. Extensive acute mostly occlusive deep venous thrombosis extending from the right common femoral vein into the calf veins. Electronically Signed   By: Titus Dubin M.D.   On: 05/12/2021 17:06     Subjective: No acute issues or events overnight, patient denies lower extremity pain, shortness of breath chest pain nausea vomiting diarrhea constipation headache fevers or chills.   Discharge Exam: Vitals:   05/14/21 2349 05/15/21 0514  BP: 138/74 121/70  Pulse: 65 65  Resp: 18 18  Temp: 98.7 F (37.1 C)  98.5 F (36.9 C)  SpO2: 98% 95%   Vitals:   05/14/21 1601 05/14/21 1925 05/14/21 2349 05/15/21 0514  BP: 117/75 138/68 138/74 121/70  Pulse:   65 65  Resp: 17 14 18 18   Temp: 99.3 F (37.4 C) 98.5 F (36.9 C) 98.7 F (37.1 C) 98.5 F (36.9 C)  TempSrc: Oral Oral Oral Oral  SpO2: 94% 98% 98% 95%  Weight:      Height:        General: Pt is alert, awake, not in acute distress Cardiovascular: RRR, S1/S2 +, no rubs, no gallops Respiratory: CTA bilaterally, no wheezing, no rhonchi Abdominal: Soft, NT, ND, bowel sounds + Extremities: Scant edema right lower extremity posterior calf without pain    The results of significant diagnostics from this hospitalization (including imaging, microbiology, ancillary and laboratory) are listed below for reference.     Microbiology: Recent Results (from the past 240 hour(s))  SARS CORONAVIRUS 2 (TAT 6-24 HRS) Nasopharyngeal Nasopharyngeal Swab     Status: None   Collection Time: 05/12/21  7:43 PM   Specimen: Nasopharyngeal Swab  Result Value Ref Range Status   SARS Coronavirus 2  NEGATIVE NEGATIVE Final    Comment: (NOTE) SARS-CoV-2 target nucleic acids are NOT DETECTED.  The SARS-CoV-2 RNA is generally detectable in upper and lower respiratory specimens during the acute phase of infection. Negative results do not preclude SARS-CoV-2 infection, do not rule out co-infections with other pathogens, and should not be used as the sole basis for treatment or other patient management decisions. Negative results must be combined with clinical observations, patient history, and epidemiological information. The expected result is Negative.  Fact Sheet for Patients: SugarRoll.be  Fact Sheet for Healthcare Providers: https://www.woods-mathews.com/  This test is not yet approved or cleared by the [place] FDA and  has been authorized for detection and/or diagnosis of SARS-CoV-2 by FDA under an  Emergency Use Authorization (EUA). This EUA will remain  in effect (meaning this test can be used) for the duration of the COVID-19 declaration under Se ction 564(b)(1) of the Act, 21 U.S.C. section 360bbb-3(b)(1), unless the authorization is terminated or revoked sooner.  Performed at [facility], [place] [address], [place], [place] [address]      Labs: BNP (last 3 results) No results for input(s): BNP in the last 8760 hours. Basic Metabolic Panel: Recent Labs  Lab 05/12/21 1611 05/13/21 0645 05/14/21 0501  NA 137 139 139  K 4.4 4.0 4.1  CL 103 103 108  CO2 25 30 27   GLUCOSE 233* 110* 116*  BUN 49* 41* 32*  CREATININE 1.98* 1.80* 1.68*  CALCIUM 8.2* 8.5* 8.2*   Liver Function Tests: No results for input(s): AST, ALT, ALKPHOS, BILITOT, PROT, ALBUMIN in the last 168 hours. No results for input(s): LIPASE, AMYLASE in the last 168 hours. No results for input(s): AMMONIA in the last 168 hours. CBC: Recent Labs  Lab 05/12/21 1611 05/13/21 0645 05/14/21 0501 05/15/21 0135  WBC 10.6* 7.6 7.0 7.5  NEUTROABS 7.2  --   --   --   HGB 15.2 15.0 14.3 14.7  HCT 47.9 47.6 43.7 44.6  MCV 95.0 93.9 91.0 89.7  PLT 181 201 173 182   Cardiac Enzymes: No results for input(s): CKTOTAL, CKMB, CKMBINDEX, TROPONINI in the last 168 hours. BNP: Invalid input(s): POCBNP CBG: Recent Labs  Lab 05/14/21 1250 05/14/21 1605 05/14/21 2127 05/15/21 0614 05/15/21 1132  GLUCAP 229* 185* 100* 100* 193*   D-Dimer No results for input(s): DDIMER in the last 72 hours. Hgb A1c No results for input(s): HGBA1C in the last 72 hours. Lipid Profile No results for input(s): CHOL, HDL, LDLCALC, TRIG, CHOLHDL, LDLDIRECT in the last 72 hours. Thyroid function studies No results for input(s): TSH, T4TOTAL, T3FREE, THYROIDAB in the last 72 hours.  Invalid input(s): FREET3 Anemia work up No results for input(s): VITAMINB12, FOLATE, FERRITIN, TIBC, IRON, RETICCTPCT in the last 72  hours. Urinalysis    Component Value Date/Time   COLORURINE YELLOW 02/17/2021 1044   APPEARANCEUR CLOUDY (A) 02/17/2021 1044   LABSPEC 1.017 02/17/2021 1044   PHURINE 5.0 02/17/2021 1044   GLUCOSEU 50 (A) 02/17/2021 1044   HGBUR MODERATE (A) 02/17/2021 1044   BILIRUBINUR NEGATIVE 02/17/2021 1044   KETONESUR NEGATIVE 02/17/2021 1044   PROTEINUR 100 (A) 02/17/2021 1044   UROBILINOGEN 1.0 08/23/2012 1732   NITRITE POSITIVE (A) 02/17/2021 1044   LEUKOCYTESUR LARGE (A) 02/17/2021 1044   Sepsis Labs Invalid input(s): PROCALCITONIN,  WBC,  LACTICIDVEN Microbiology Recent Results (from the past 240 hour(s))  SARS CORONAVIRUS 2 (TAT 6-24 HRS) Nasopharyngeal Nasopharyngeal Swab     Status: None   Collection Time:  05/12/21  7:43 PM   Specimen: Nasopharyngeal Swab  Result Value Ref Range Status   SARS Coronavirus 2 NEGATIVE NEGATIVE Final    Comment: (NOTE) SARS-CoV-2 target nucleic acids are NOT DETECTED.  The SARS-CoV-2 RNA is generally detectable in upper and lower respiratory specimens during the acute phase of infection. Negative results do not preclude SARS-CoV-2 infection, do not rule out co-infections with other pathogens, and should not be used as the sole basis for treatment or other patient management decisions. Negative results must be combined with clinical observations, patient history, and epidemiological information. The expected result is Negative.  Fact Sheet for Patients: SugarRoll.be  Fact Sheet for Healthcare Providers: https://www.woods-mathews.com/  This test is not yet approved or cleared by the [place] FDA and  has been authorized for detection and/or diagnosis of SARS-CoV-2 by FDA under an Emergency Use Authorization (EUA). This EUA will remain  in effect (meaning this test can be used) for the duration of the COVID-19 declaration under Se ction 564(b)(1) of the Act, 21 U.S.C. section 360bbb-3(b)(1), unless  the authorization is terminated or revoked sooner.  Performed at [facility], [place] [address] [place], [place] [address]      Time coordinating discharge: Over 30 minutes  SIGNED:  Little Ishikawa, DO [facility] 05/15/2021, 1:43 PM Pager   If 7PM-7AM, please contact night-coverage [email or web address]

## 2021-05-15 NOTE — Social Work (Signed)
CSW was alerted that pt can be discharged back to [facility] today.CSW called facility and they stated that they could accept pt back today.They are requesting a DC faxed to [phone].  CSW will arrange transport and prepare DC paper work.

## 2021-05-15 NOTE — TOC Transition Note (Signed)
Transition of Care [facility]) - CM/SW Discharge Note   Patient Details  Name: STEPHEN BARUCH MRN: [id number] Date of Birth: 02/25/1946  [facility] CM/SW Contact:  Bary Castilla, LCSW Phone Number: [phone] 05/15/2021, 4:05 PM   Clinical Narrative:    Patient will [place] date:?05/15/2021 Family notified:?Carolyn Transport by: Corey Harold   Per MD patient ready for DC to [facility]. RN, patient, patient's family, and facility notified of DC. Discharge Summary sent to facility. RN given number for report  [phone]. DC packet on chart. Ambulance transport requested for patient.   CSW signing off.   [place], [place] [phone]          Patient Goals and CMS Choice        Discharge Placement                       Discharge Plan and Services                                     Social Determinants of Health (SDOH) Interventions     Readmission Risk Interventions No flowsheet data found.

## 2021-05-15 NOTE — Progress Notes (Addendum)
Pt report was given to [name] at [facility] at ap[prox 1830. Pt VSS, on room air, with foley going w/ pt to SNF that he arrived to the hospital with. L forearm IV taken out. Pt is alert and oriented to name and location. Doppler pulses on both BLE and heart rate is sinus brady on the monitor 60's, BP: 122/70 and afebrile. R: 18.   At times has confusion with situation and time. Pt ate dinner prior to giving report.  Pt will begin VTE Eliquis per MD orders. [facility] will be coming to get pt. AVS documentation will be given to them upon arrival. Will continue to monitor.

## 2021-05-17 ENCOUNTER — Non-Acute Institutional Stay (SKILLED_NURSING_FACILITY): Payer: Medicare PPO | Admitting: Adult Health

## 2021-05-17 ENCOUNTER — Encounter: Payer: Self-pay | Admitting: Adult Health

## 2021-05-17 DIAGNOSIS — E1129 Type 2 diabetes mellitus with other diabetic kidney complication: Secondary | ICD-10-CM | POA: Diagnosis not present

## 2021-05-17 DIAGNOSIS — E1151 Type 2 diabetes mellitus with diabetic peripheral angiopathy without gangrene: Secondary | ICD-10-CM

## 2021-05-17 DIAGNOSIS — I610 Nontraumatic intracerebral hemorrhage in hemisphere, subcortical: Secondary | ICD-10-CM

## 2021-05-17 DIAGNOSIS — F323 Major depressive disorder, single episode, severe with psychotic features: Secondary | ICD-10-CM

## 2021-05-17 DIAGNOSIS — N3289 Other specified disorders of bladder: Secondary | ICD-10-CM

## 2021-05-17 DIAGNOSIS — E1169 Type 2 diabetes mellitus with other specified complication: Secondary | ICD-10-CM | POA: Diagnosis not present

## 2021-05-17 DIAGNOSIS — E1159 Type 2 diabetes mellitus with other circulatory complications: Secondary | ICD-10-CM

## 2021-05-17 DIAGNOSIS — G8111 Spastic hemiplegia affecting right dominant side: Secondary | ICD-10-CM | POA: Diagnosis not present

## 2021-05-17 DIAGNOSIS — I700 Atherosclerosis of aorta: Secondary | ICD-10-CM

## 2021-05-17 DIAGNOSIS — E43 Unspecified severe protein-calorie malnutrition: Secondary | ICD-10-CM

## 2021-05-17 DIAGNOSIS — S3994XS Unspecified injury of external genitals, sequela: Secondary | ICD-10-CM

## 2021-05-17 DIAGNOSIS — E785 Hyperlipidemia, unspecified: Secondary | ICD-10-CM

## 2021-05-17 DIAGNOSIS — I152 Hypertension secondary to endocrine disorders: Secondary | ICD-10-CM

## 2021-05-17 DIAGNOSIS — I82411 Acute embolism and thrombosis of right femoral vein: Secondary | ICD-10-CM | POA: Diagnosis not present

## 2021-05-17 DIAGNOSIS — R339 Retention of urine, unspecified: Secondary | ICD-10-CM

## 2021-05-17 DIAGNOSIS — Z978 Presence of other specified devices: Secondary | ICD-10-CM

## 2021-05-17 DIAGNOSIS — N183 Chronic kidney disease, stage 3 unspecified: Secondary | ICD-10-CM

## 2021-05-17 DIAGNOSIS — R809 Proteinuria, unspecified: Secondary | ICD-10-CM

## 2021-05-17 DIAGNOSIS — E1122 Type 2 diabetes mellitus with diabetic chronic kidney disease: Secondary | ICD-10-CM | POA: Diagnosis not present

## 2021-05-17 NOTE — Progress Notes (Signed)
Location:  [facility] Room Number: 423-N Place of Service:  SNF (31)   CODE STATUS: Full  No Known Allergies  Chief Complaint  Patient presents with  . Hospitalization Follow-up    Hospital follow-up    HPI:  He is a 75 year old long term resident of this facility who has been hospitalized from 05-12-21 through 05-15-21. He developed a significant DVT in his right lower extremity; his stroke affected side. He was sent to the ED for further treatment options. Vascular surgery was consulted. At that time given patient's resolution of symptoms and tolerating eliquis; there is no indication for intervention or imaging. In 3-6 months will need reevaluation of dvt to ensure resolution. Will need to consider stopping anticoagulation.  There are no reports of uncontrolled pain; no changes in appetite; no reports of anxiety or agitation. He continues to be followed for his chronic illnesses including:  CAD native heart without angina:   Hypertension associated with type 2 diabetes mellitus:   Dyslipidemia associated with type 2 diabetes mellitus:    Micro-albumin due to type 2 diabetes mellitus   Past Medical History:  Diagnosis Date  . A-fib ([place])    a. Post-op afib after CABG 08/2012.  [name] Acute gastric ulcer with hemorrhage   . Acute respiratory failure with hypoxia ([place])   . Aphasia following cerebral infarction   . Atrial fibrillation ([place])   . CAD (coronary artery disease)    a. NSTEMI s/p stent to distal RCA 03/2000. b. Inf MI s/p emergent thrombectomy/stenting mid RCA 10/2000. c. NSTEMI s/p CABGx4 (LIMA-LAD, SVG-OM2, seq SVG-acute marginal and PD) 02/14/13 - course complicated by confusion, post-op AF, L pleural effusion with thoracentesis. d. Normal LV function by echo 08/2012.  . Diabetes mellitus   . Diverticulosis   . Dysphagia following cerebral infarction   . Encephalopathy   . Extended spectrum beta lactamase (ESBL) resistance   . Generalized anxiety disorder    . GERD (gastroesophageal reflux disease)   . Hemiplegia and hemiparesis following cerebral infarction affecting right dominant side ([place])   . Hiatal hernia   . HLD (hyperlipidemia)   . HTN (hypertension)   . Non-ST elevation (NSTEMI) myocardial infarction ([place])   . Nontraumatic intracerebral hemorrhage in hemisphere, subcortical ([place])   . Peripheral vascular disease ([place])    a. Carotid dopplers neg 08/13/12. b. Pre-cabg ABIs - R=0.87 suggesting mild dz, L=1.29 possibly falsely elevated due to calcified vessels.  . Pleural effusion    a. L pleural eff after CABG s/p thoracentesis 08/22/12.  . Prostate cancer [facility])    Status post radiation treatment.  . Prostatic hypertrophy    a. Hx of urinary retention, awaiting TURP.  [name] Severe protein-calorie malnutrition ([place])   . Tobacco abuse   . 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 Valvular heart disease    a. Mild  MR by TEE 08/2012.    Past Surgical History:  Procedure Laterality Date  . APPENDECTOMY    . BACK SURGERY    . CORONARY ARTERY BYPASS GRAFT  08/16/2012   Procedure: CORONARY ARTERY BYPASS GRAFTING (CABG);  Surgeon: Melrose Nakayama, MD;  Location: [facility];  Service: Open Heart Surgery;  Laterality: N/A;  . IR REPLC GASTRO/COLONIC TUBE PERCUT W/FLUORO  02/13/2019  . LEFT HEART CATHETERIZATION WITH CORONARY ANGIOGRAM N/A 08/14/2012   Procedure: LEFT HEART CATHETERIZATION WITH CORONARY ANGIOGRAM;  Surgeon: Peter M Martinique, MD;  Location: [facility] CATH LAB;  Service: Cardiovascular;  Laterality:  N/A;    Social History   Socioeconomic History  . Marital status: Married    Spouse name: Not on file  . Number of children: Not on file  . Years of education: Not on file  . Highest education level: Not on file  Occupational History  . Occupation: retired   Tobacco Use  . Smoking status: Former Smoker    Quit date: 11/12/1972    Years since quitting: 48.5  . Smokeless tobacco: Never Used  Vaping Use  . Vaping Use: Never  used  Substance and Sexual Activity  . Alcohol use: No  . Drug use: No  . Sexual activity: Not Currently  Other Topics Concern  . Not on file  Social History Narrative   He is a long term patient of [place]    Social Determinants of Health   Financial Resource Strain: Not on file  Food Insecurity: Not on file  Transportation Needs: Not on file  Physical Activity: Not on file  Stress: Not on file  Social Connections: Not on file  Intimate Partner Violence: Not on file   Family History  Problem Relation Age of Onset  . Hypertension Mother       VITAL SIGNS BP 132/80   Pulse 75   Temp 98.1 F (36.7 C)   Resp 20   Ht 5' 11"  (1.803 m)   Wt 172 lb (78 kg)   SpO2 100%   BMI 23.99 kg/m   Outpatient Encounter Medications as of 05/17/2021  Medication Sig Note  . acetaminophen (TYLENOL) 325 MG tablet Take 650 mg by mouth every 4 (four) hours as needed. per standing order for pain/fever DO NOT EXCEED >3,000 mg in 24 hours   . amLODipine (NORVASC) 5 MG tablet Take 5 mg by mouth daily.    [name] apixaban (ELIQUIS) 5 MG TABS tablet Take 2 tablets (10 mg total) by mouth 2 (two) times daily for 6 days.   [name] atorvastatin (LIPITOR) 40 MG tablet Take 40 mg by mouth at bedtime.    . baclofen (LIORESAL) 10 MG tablet Take 1 tablet by mouth 2 (two) times daily. Hold for sedation   . Balsam [place]-Castor Oil (VENELEX) OINT Apply topically. Special Instructions: Apply to sacrum, coccyx and bilateral buttocks qshift for prevention/ redness. Every Shift Day, Evening, Night   . Eyelid Cleansers (OCUSOFT LID SCRUB EX) daily. Cleanse both eye lids   . FLUoxetine (PROZAC) 20 MG/5ML solution Take 10 mg by mouth daily.   . Glucerna (GLUCERNA) LIQD Take 237 mLs by mouth daily.   . hydrocortisone cream 1 % Apply rectally twice daily and as needed for hemorrhoids   . insulin lispro (HUMALOG) 100 UNIT/ML cartridge Inject 10 Units into the skin 3 (three) times daily before meals. Give 10 units SQ with meals if Blood  Sugar >150   . lansoprazole (PREVACID SOLUTAB) 30 MG disintegrating tablet Take 30 mg by mouth daily. Give before meals   . loperamide (IMODIUM A-D) 2 MG tablet Take 2 mg by mouth as needed for diarrhea or loose stools. 05/12/2021: PRN   . Melatonin 1 MG TABS Take 2 tablets by mouth at bedtime.    . NON FORMULARY Diet Change: Regular, thin liquids   . oxybutynin (DITROPAN XL) 15 MG 24 hr tablet Take 15 mg by mouth daily.   . polyethylene glycol (MIRALAX / GLYCOLAX) 17 g packet Take 17 g by mouth daily as needed.   . valsartan (DIOVAN) 80 MG tablet Take 80 mg by mouth  daily.   .  Insulin Glargine (LANTUS SOLOSTAR) 100 UNIT/ML Solostar Pen Inject 20 Units into the skin daily.    No facility-administered encounter medications on file as of 05/17/2021.     SIGNIFICANT DIAGNOSTIC EXAMS   TODAY  05-12-21: right lower extremity venous doppler: preliminary report: clot from groin to foot.   05-12-21: right lower extremity venous doppler (From ED):   1. Extensive acute mostly occlusive deep venous thrombosis extending from the right common femoral vein into the calf veins.   LABS REVIEWED PREVIOUS:    05-07-20: urine for micro-albumin: 413.2 ( on ARB)  05-18-20: wbc 9.2; hgb 14.8; hct 45.8; mcv 92.5 plt 270; glucose 128; bun 32; creat 1.60 ;k+ 4.3; na++ 140; ca 8.6 alk phos 142 albumin 2.5 05-19-20: glucose 109; bun 34; creat 1.79; k+ 4.0; na++ 140; ca 8.2 alk phos 145; albumin 2.3  05-22-20: glucose 84; bun 26; creat 1.58; k+ 4.7; na++ 140; ca 8.5 05-25-20: glucose 96; bun 23; creat 1.42; k+ 4.0; na++ 139; ca 8.1  07-23-20: hgb a1c 6.2; chol 79; ldl 48; trig 53; hdl 20  10-29-20: glucose 103; bun 39; creat 1.64; k+ 4.0; na++ 140; ca 8.5  01-07-21: wbc 7.8; hgb 15.3; hct 47.7; mcv 93.7 plt 180; glucose 101; bun 45; creat 1.85; k+ 4.4; na++ 140; ca 8.6 GFR 37; liver normal albumin 2.8 chol 101; ldl 60; trig 77; hdl 26; hgb a1c 6.5; tsh 1.908 02-15-21: urine culture: multiple species  03-25-21 hgb a1c  6.6  TODAY  05-11-21: urine for micro albumin 1299.7 (413.2) on ARB   05-12-21: wbc 10.6; hgb `5.2; hct 47.9; mcv 95.0 plt 181; glucose 233; bun 49; creat 1.98; k+ 4.4; na++ 137; ca 8.2; [id number] 05-14-21: glucose 116; bun 32; creat 1.68; k+ 4.1; na++ 139; ca 8.2; GFR 42 05-15-21: wbc 7.5; hgb 14.7; hct 44.6; mcv 89.7 plt 182    Review of Systems  Reason unable to perform ROS: expressive aphasia     Physical Exam Constitutional:      General: He is not in acute distress.    Appearance: He is well-developed. He is not diaphoretic.  Neck:     Thyroid: No thyromegaly.  Cardiovascular:     Rate and Rhythm: Normal rate and regular rhythm.     Pulses: Normal pulses.     Heart sounds: Normal heart sounds.     Comments: Hx cabg  Pulmonary:     Effort: Pulmonary effort is normal. No respiratory distress.     Breath sounds: Normal breath sounds.  Abdominal:     General: Bowel sounds are normal. There is no distension.     Palpations: Abdomen is soft.     Tenderness: There is no abdominal tenderness.  Genitourinary:    Comments: Foley penile tear  Musculoskeletal:     Cervical back: Neck supple.     Right lower leg: Edema present.     Left lower leg: No edema.     Comments: Right hemiplegia Right foot without pedal; post time popliteal pulses  1-2 + right lower extremity edema    Lymphadenopathy:     Cervical: No cervical adenopathy.  Skin:    General: Skin is warm and dry.  Neurological:     Mental Status: He is alert. Mental status is at baseline.  Psychiatric:        Mood and Affect: Mood normal.       ASSESSMENT/ PLAN:  TODAY:   1. Acute deep vein thrombosis (DVT) of femoral  vein of right lower leg (05-12-21): is stable does have right lower extremity edema; will continue eliquis 10 mg twice daily then 05-22-21 5 mg twice daily   2. CAD native heart without angina: is status post cabg 2012: will continue diovan 80 mg daily   3. Hypertension associated with type 2 diabetes  mellitus: is stable b/p 132/80 will continue norvasc 5 mg daily; diovan 80 mg daily   4. Dyslipidemia associated with type 2 diabetes mellitus: is stable LDL 77 will continue lilpitor 40 mg daily   5. Micro-albumin due to type 2 diabetes mellitus: is worse: 1299.7 is on ARB  6. Major depression with psychotic feature: is stable will continue prozac 10 mg daily is off buspar and antipsychotics  7. CKD stage 3 due to type 2 diabetes mellitus: is stable bun 32; creat 1.68; GFR 42  8. Chronic urine retention; with bladder spasms: is stable will continue ditropan xl 15 mg daily   9. Type 2 diabetes mellitus with peripheral vascular disease: is stable hgb a1c 6.7 will continue lantus 20 units nightly and humalog 10 units with meals  Is on statin arb; eliquis   10. Protein calorie malnutrition severe: is stable weight is 172 pounds albumin 2.2 will continue supplements as indicated  11. Nontraumatic subcortical hemorrhage or left cerebral hemisphere/right spastic hemiplegia: is stable will continue baclofen 10 mg twice daily for spasticity   12. Aortic atherosclerosis (ct 01-04-19) will monitor   Will check cbc; bmp   Ok Edwards NP [facility]  Contact [phone] Monday through Friday 8am- 5pm  After hours call [phone]

## 2021-05-21 ENCOUNTER — Other Ambulatory Visit (HOSPITAL_COMMUNITY)
Admission: RE | Admit: 2021-05-21 | Discharge: 2021-05-21 | Disposition: A | Discharge status: Home / Self Care | Payer: Medicare PPO | Source: Skilled Nursing Facility | Attending: Adult Health | Admitting: Adult Health

## 2021-05-21 LAB — CBC WITH DIFFERENTIAL/PLATELET
Abs Immature Granulocytes: 0.02 10*3/uL (ref 0.00–0.07)
Basophils Absolute: 0.1 10*3/uL (ref 0.0–0.1)
Basophils Relative: 1 %
Eosinophils Absolute: 0.4 10*3/uL (ref 0.0–0.5)
Eosinophils Relative: 5 %
HCT: 42.9 % (ref 39.0–52.0)
Hemoglobin: 13.6 g/dL (ref 13.0–17.0)
Immature Granulocytes: 0 %
Lymphocytes Relative: 24 %
Lymphs Abs: 2.1 10*3/uL (ref 0.7–4.0)
MCH: 29.6 pg (ref 26.0–34.0)
MCHC: 31.7 g/dL (ref 30.0–36.0)
MCV: 93.5 fL (ref 80.0–100.0)
Monocytes Absolute: 0.7 10*3/uL (ref 0.1–1.0)
Monocytes Relative: 8 %
Neutro Abs: 5.3 10*3/uL (ref 1.7–7.7)
Neutrophils Relative %: 62 %
Platelets: 209 10*3/uL (ref 150–400)
RBC: 4.59 MIL/uL (ref 4.22–5.81)
RDW: 13 % (ref 11.5–15.5)
WBC: 8.6 10*3/uL (ref 4.0–10.5)
nRBC: 0 % (ref 0.0–0.2)

## 2021-05-21 LAB — BASIC METABOLIC PANEL
Anion gap: 4 — ABNORMAL LOW (ref 5–15)
BUN: 35 mg/dL — ABNORMAL HIGH (ref 8–23)
CO2: 27 mmol/L (ref 22–32)
Calcium: 8.1 mg/dL — ABNORMAL LOW (ref 8.9–10.3)
Chloride: 108 mmol/L (ref 98–111)
Creatinine, Ser: 1.75 mg/dL — ABNORMAL HIGH (ref 0.61–1.24)
GFR, Estimated: 40 mL/min — ABNORMAL LOW (ref >60)
Glucose, Bld: 147 mg/dL — ABNORMAL HIGH (ref 70–99)
Potassium: 4 mmol/L (ref 3.5–5.1)
Sodium: 139 mmol/L (ref 135–145)

## 2021-05-24 ENCOUNTER — Encounter: Payer: Self-pay | Admitting: Internal Medicine

## 2021-05-24 ENCOUNTER — Non-Acute Institutional Stay (SKILLED_NURSING_FACILITY): Payer: Medicare PPO | Admitting: Internal Medicine

## 2021-05-24 DIAGNOSIS — I82491 Acute embolism and thrombosis of other specified deep vein of right lower extremity: Secondary | ICD-10-CM | POA: Diagnosis not present

## 2021-05-24 DIAGNOSIS — E1151 Type 2 diabetes mellitus with diabetic peripheral angiopathy without gangrene: Secondary | ICD-10-CM

## 2021-05-24 DIAGNOSIS — I4811 Longstanding persistent atrial fibrillation: Secondary | ICD-10-CM | POA: Diagnosis not present

## 2021-05-24 DIAGNOSIS — N183 Chronic kidney disease, stage 3 unspecified: Secondary | ICD-10-CM | POA: Diagnosis not present

## 2021-05-24 DIAGNOSIS — E1122 Type 2 diabetes mellitus with diabetic chronic kidney disease: Secondary | ICD-10-CM | POA: Diagnosis not present

## 2021-05-24 NOTE — Progress Notes (Signed)
NURSING HOME LOCATION:  [facility] ROOM NUMBER:  [id number] P  CODE STATUS:  Full Code  PCP:  Celene Squibb MD  This is a redmission note to this SNFperformed on this date less than 30 days from date of readmission. Included are recent medical/surgical history ,reconciled medication list;and comprehensive review of systems.  Comprehensive physical exam was also performed. Additionally a clinical summary was entered for each active diagnosis pertinent to this readmission in the Problem List to enhance continuity of care.  HPI: Patient was rehospitalized 6/1 - 05/15/2021 with pain in the right lower extremity.  Venous Doppler revealed extensive DVT involving the right common femoral vein and extending into the calf veins.  History was difficult to obtain,impacted by his dysphasia. IV heparin was initiated as a bridge to p.o. Eliquis 10 mg twice daily for 6 days with subsequent 5 mg twice daily maintenance dose.  He was to be reevaluated in 3-6 months as to the need for lifelong DOAC. Vascular Surgery consult felt no intervention or further evaluation was indicated as symptoms had improved dramatically with the anticoagulation. A1c 6.6%; creatinine 1.68 and GFR 42. In the hospital glucose range was 110 up to 233.  The latter was an outlier.  He does have a past medical history of hemorrhagic CVA in 2019,manifest as subcortical hemorrhage in the left cerebral hemisphere.  Additionally has a past medical history of gastric ulcer w hemorrhage,essential hypertension, CAD, MI,and diabetes with peripheral vascular disease and CKD. He is S/P CABG in 2013.   Review of systems: Accuchecks here @ SNF range from 80 - 272. His aphasia makes obtaining history difficult.  Most of his answers were a monosyllabic "no".  His only complete sentence was "Who are you?".  When asked if he were having pain his response was "no".  He did state that he was "doing better".  The remainder of the review of systems  again was answered with a monosyllabic reply.  Physical exam:  Pertinent or positive findings: Initially he was asleep but roused easily.  He seemed to have a suspicious countenance as I asked the questions.  Dental hygiene is immaculate.  The second heart sound is slightly increased.  Foley catheter is present.  The left dorsalis pedis pulse is the strongest pulse to palpation.  The pulses in the right foot are weaker but this may be related to the pitting edema which is one half-1+. He could not oppose my hand with the right upper extremity.  The right hand is clawed , holding a soft sphere & braced.  The right lower extremity is weaker than the left.  The right lower extremity is visibly edematous compared to the left.  Bevelyn Buckles' sign is negative in the right calf.  General appearance: Adequately nourished; no acute distress, increased work of breathing is present.   Lymphatic: No lymphadenopathy about the head, neck, axilla. Eyes: No conjunctival inflammation or lid edema is present. There is no scleral icterus. Ears:  External ear exam shows no significant lesions or deformities.   Nose:  External nasal examination shows no deformity or inflammation. Nasal mucosa are pink and moist without lesions, exudates Oral exam: Lips and gums are healthy appearing.There is no oropharyngeal erythema or exudate. Neck:  No thyromegaly, masses, tenderness noted.    Heart:  Normal rate and regular rhythm. S1 normal without gallop, murmur, click, rub.  Lungs: Chest clear to auscultation without wheezes, rhonchi, rales, rubs. Abdomen: Bowel sounds are normal.  Abdomen is  soft and nontender with no organomegaly, hernias, masses. GU: Deferred  Extremities:  No cyanosis, clubbing. Neurologic exam:  Balance, Rhomberg, finger to nose testing could not be completed due to clinical state Skin: Warm & dry w/o tenting. No significant lesions or rash.  See clinical summary under each active problem in the Problem List  with associated updated therapeutic plan

## 2021-05-26 ENCOUNTER — Encounter: Payer: Self-pay | Admitting: Internal Medicine

## 2021-05-26 NOTE — Patient Instructions (Signed)
See assessment and plan under each diagnosis in the problem list and acutely for this visit 

## 2021-05-26 NOTE — Assessment & Plan Note (Addendum)
Clinically in regular rhythm w well controlled rate. Most recent EKG in Epic 01/04/2019.

## 2021-05-26 NOTE — Assessment & Plan Note (Signed)
  Medication List reviewed; no nephrotoxic agents identified; ie not on Metformin , ACE, or ARB.

## 2021-05-26 NOTE — Assessment & Plan Note (Signed)
No bleeding dyscrasias since readmission to SNF

## 2021-05-26 NOTE — Assessment & Plan Note (Signed)
Glucose range 80-272 here @ SNF

## 2021-06-01 DIAGNOSIS — F5105 Insomnia due to other mental disorder: Secondary | ICD-10-CM | POA: Diagnosis not present

## 2021-06-01 DIAGNOSIS — F333 Major depressive disorder, recurrent, severe with psychotic symptoms: Secondary | ICD-10-CM | POA: Diagnosis not present

## 2021-06-09 DIAGNOSIS — I610 Nontraumatic intracerebral hemorrhage in hemisphere, subcortical: Secondary | ICD-10-CM | POA: Diagnosis not present

## 2021-06-09 DIAGNOSIS — Z1159 Encounter for screening for other viral diseases: Secondary | ICD-10-CM | POA: Diagnosis not present

## 2021-06-10 ENCOUNTER — Encounter: Payer: Self-pay | Admitting: Adult Health

## 2021-06-10 ENCOUNTER — Non-Acute Institutional Stay (SKILLED_NURSING_FACILITY): Payer: Medicare PPO | Admitting: Adult Health

## 2021-06-10 DIAGNOSIS — I82491 Acute embolism and thrombosis of other specified deep vein of right lower extremity: Secondary | ICD-10-CM | POA: Diagnosis not present

## 2021-06-10 DIAGNOSIS — G8111 Spastic hemiplegia affecting right dominant side: Secondary | ICD-10-CM | POA: Diagnosis not present

## 2021-06-10 DIAGNOSIS — I6930 Unspecified sequelae of cerebral infarction: Secondary | ICD-10-CM | POA: Diagnosis not present

## 2021-06-10 DIAGNOSIS — I700 Atherosclerosis of aorta: Secondary | ICD-10-CM

## 2021-06-10 NOTE — Progress Notes (Signed)
Location:  [place] Room Number: 992-E Place of Service:  SNF (31)   CODE STATUS: Full  No Known Allergies  Chief Complaint  Patient presents with   Acute Visit    Care plan meeting    HPI:  We have come together for his care plan meeting. No BIMS; mood 0/30. He is nonambulatory and is dependent to get out of bed. He is extensive to dependent with his adls. He requires assist with eating. He is incontinent of bowel has foley; cbg readings are stable. There have been no falls. His weight is stable and is currently not being followed by therapy. There are no reports of uncontrolled pain. He continues to be followed for his chronic illnesses including: Aortic atherosclerosis   Acute deep vein thrombosis (DVT) of other specified vein of right lower extremity  Right spastic hemiplegia  History of hemorrhagic cerebrovascular accident (CVA) with residual deficit  Past Medical History:  Diagnosis Date   A-fib ([place])    a. Post-op afib after CABG 08/2012.   Acute gastric ulcer with hemorrhage    Acute respiratory failure with hypoxia (HCC)    Aphasia following cerebral infarction    Atrial fibrillation (HCC)    CAD (coronary artery disease)    a. NSTEMI s/p stent to distal RCA 03/2000. b. Inf MI s/p emergent thrombectomy/stenting mid RCA 10/2000. c. NSTEMI s/p CABGx4 (LIMA-LAD, SVG-OM2, seq SVG-acute marginal and PD) 01/17/82 - course complicated by confusion, post-op AF, L pleural effusion with thoracentesis. d. Normal LV function by echo 08/2012.   Diverticulosis    DM with CKD    Dysphagia following cerebral infarction    Encephalopathy    Extended spectrum beta lactamase (ESBL) resistance    Generalized anxiety disorder    GERD (gastroesophageal reflux disease)    Hemiplegia and hemiparesis following cerebral infarction affecting right dominant side (HCC)    Hiatal hernia    HLD (hyperlipidemia)    HTN (hypertension)    Non-ST elevation (NSTEMI) myocardial  infarction [facility])    Nontraumatic intracerebral hemorrhage in hemisphere, subcortical [facility])    Peripheral vascular disease ([place])    a. Carotid dopplers neg 08/13/12. b. Pre-cabg ABIs - R=0.87 suggesting mild dz, L=1.29 possibly falsely elevated due to calcified vessels.   Pleural effusion    a. L pleural eff after CABG s/p thoracentesis 08/22/12.   Prostate cancer [facility])    Status post radiation treatment.   Prostatic hypertrophy    a. Hx of urinary retention, awaiting TURP.   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 Mild  MR by TEE 08/2012.    Past Surgical History:  Procedure Laterality Date   APPENDECTOMY     BACK SURGERY     CORONARY ARTERY BYPASS GRAFT  08/16/2012   Procedure: CORONARY ARTERY BYPASS GRAFTING (CABG);  Surgeon: Melrose Nakayama, MD;  Location: [place];  Service: Open Heart Surgery;  Laterality: N/A;   IR REPLC GASTRO/COLONIC TUBE PERCUT W/FLUORO  02/13/2019   LEFT HEART CATHETERIZATION WITH CORONARY ANGIOGRAM N/A 08/14/2012   Procedure: LEFT HEART CATHETERIZATION WITH CORONARY ANGIOGRAM;  Surgeon: Peter M Martinique, MD;  Location: [facility] CATH LAB;  Service: Cardiovascular;  Laterality: N/A;    Social History   Socioeconomic History   Marital status: Married    Spouse name: Not on file   Number of children: Not on file  Years of education: Not on file   Highest education level: Not on file  Occupational History   Occupation: retired   Tobacco Use   Smoking status: Former    Pack years: 0.00    Types: Cigarettes    Quit date: 11/12/1972    Years since quitting: 48.6   Smokeless tobacco: Never  Vaping Use   Vaping Use: Never used  Substance and Sexual Activity   Alcohol use: No   Drug use: No   Sexual activity: Not Currently  Other Topics Concern   Not on file  Social History Narrative   He is a long term patient of [place]    Social Determinants of Health    Financial Resource Strain: Not on file  Food Insecurity: Not on file  Transportation Needs: Not on file  Physical Activity: Not on file  Stress: Not on file  Social Connections: Not on file  Intimate Partner Violence: Not on file   Family History  Problem Relation Age of Onset   Hypertension Mother       VITAL SIGNS BP 120/70   Pulse 66   Temp 97.7 F (36.5 C)   Ht $R'5\' 11"'iC$  (1.803 m)   Wt 172 lb (78 kg)   SpO2 100%   BMI 23.99 kg/m   Outpatient Encounter Medications as of 06/10/2021  Medication Sig   acetaminophen (TYLENOL) 325 MG tablet Take 650 mg by mouth every 4 (four) hours as needed. per standing order for pain/fever DO NOT EXCEED >3,000 mg in 24 hours   amLODipine (NORVASC) 5 MG tablet Take 5 mg by mouth daily.    atorvastatin (LIPITOR) 40 MG tablet Take 40 mg by mouth at bedtime.    baclofen (LIORESAL) 10 MG tablet Take 1 tablet by mouth 2 (two) times daily. Hold for sedation   Balsam [place]-Castor Oil (VENELEX) OINT Apply topically. Special Instructions: Apply to sacrum, coccyx and bilateral buttocks qshift for prevention/ redness. Every Shift Day, Evening, Night   Eyelid Cleansers (OCUSOFT LID SCRUB EX) daily. Cleanse both eye lids   FLUoxetine (PROZAC) 20 MG/5ML solution Take 10 mg by mouth daily.   Glucerna (GLUCERNA) LIQD Take 237 mLs by mouth daily.   hydrocortisone cream 1 % Apply rectally twice daily and as needed for hemorrhoids   insulin glargine (LANTUS) 100 UNIT/ML injection Inject 20 Units into the skin at bedtime.   insulin lispro (HUMALOG) 100 UNIT/ML cartridge Inject 10 Units into the skin 3 (three) times daily before meals. Give 10 units SQ with meals if Blood Sugar >150   lansoprazole (PREVACID SOLUTAB) 30 MG disintegrating tablet Take 30 mg by mouth daily. Give before meals   loperamide (IMODIUM A-D) 2 MG tablet Take 2 mg by mouth as needed for diarrhea or loose stools.   losartan (COZAAR) 50 MG tablet Take 50 mg by mouth daily.   Melatonin 1 MG  TABS Take 2 tablets by mouth at bedtime.    NON FORMULARY Diet Change: Regular, thin liquids   oxybutynin (DITROPAN XL) 15 MG 24 hr tablet Take 15 mg by mouth daily.   polyethylene glycol (MIRALAX / GLYCOLAX) 17 g packet Take 17 g by mouth daily as needed.   apixaban (ELIQUIS) 5 MG TABS tablet Take 2 tablets (10 mg total) by mouth 2 (two) times daily for 6 days.   No facility-administered encounter medications on file as of 06/10/2021.     SIGNIFICANT DIAGNOSTIC EXAMS   REVIEWED   05-12-21: right lower extremity venous doppler: preliminary report:  clot from groin to foot.   05-12-21: right lower extremity venous doppler (From ED):   1. Extensive acute mostly occlusive deep venous thrombosis extending from the right common femoral vein into the calf veins.  NO NEW EXAMS.    LABS REVIEWED PREVIOUS:    05-18-20: wbc 9.2; hgb 14.8; hct 45.8; mcv 92.5 plt 270; glucose 128; bun 32; creat 1.60 ;k+ 4.3; na++ 140; ca 8.6 alk phos 142 albumin 2.5 05-19-20: glucose 109; bun 34; creat 1.79; k+ 4.0; na++ 140; ca 8.2 alk phos 145; albumin 2.3  05-22-20: glucose 84; bun 26; creat 1.58; k+ 4.7; na++ 140; ca 8.5 05-25-20: glucose 96; bun 23; creat 1.42; k+ 4.0; na++ 139; ca 8.1  07-23-20: hgb a1c 6.2; chol 79; ldl 48; trig 53; hdl 20  10-29-20: glucose 103; bun 39; creat 1.64; k+ 4.0; na++ 140; ca 8.5  01-07-21: wbc 7.8; hgb 15.3; hct 47.7; mcv 93.7 plt 180; glucose 101; bun 45; creat 1.85; k+ 4.4; na++ 140; ca 8.6 GFR 37; liver normal albumin 2.8 chol 101; ldl 60; trig 77; hdl 26; hgb a1c 6.5; tsh 1.908 02-15-21: urine culture: multiple species  03-25-21 hgb a1c 6.6 05-11-21: urine for micro albumin 1299.7 (413.2) on ARB   05-12-21: wbc 10.6; hgb `5.2; hct 47.9; mcv 95.0 plt 181; glucose 233; bun 49; creat 1.98; k+ 4.4; na++ 137; ca 8.2; GFR34 05-14-21: glucose 116; bun 32; creat 1.68; k+ 4.1; na++ 139; ca 8.2; GFR 42 05-15-21: wbc 7.5; hgb 14.7; hct 44.6; mcv 89.7 plt 182  NO NEW LABS.    Review of Systems   Reason unable to perform ROS: expressive aphasia.   Physical Exam Constitutional:      General: He is not in acute distress.    Appearance: He is well-developed. He is not diaphoretic.  Neck:     Thyroid: No thyromegaly.  Cardiovascular:     Rate and Rhythm: Normal rate and regular rhythm.     Pulses: Normal pulses.     Heart sounds: Normal heart sounds.     Comments: Hx cabg Pulmonary:     Effort: Pulmonary effort is normal. No respiratory distress.     Breath sounds: Normal breath sounds.  Abdominal:     General: Bowel sounds are normal. There is no distension.     Palpations: Abdomen is soft.     Tenderness: There is no abdominal tenderness.  Genitourinary:    Comments: Foley penile tear Musculoskeletal:     Cervical back: Neck supple.     Right lower leg: Edema present.     Left lower leg: No edema.     Comments: Right hemiplegia 1-2 + right lower extremity edema    Lymphadenopathy:     Cervical: No cervical adenopathy.  Skin:    General: Skin is warm and dry.  Neurological:     Mental Status: He is alert. Mental status is at baseline.  Psychiatric:        Mood and Affect: Mood normal.      ASSESSMENT/ PLAN:  TODAY  Aortic atherosclerosis Acute deep vein thrombosis (DVT) of other specified vein of right lower extremity Right spastic hemiplegia History of hemorrhagic cerebrovascular accident (CVA) with residual deficit   Will continue current plan of care Will continue current medications Will continue to monitor his status.   Time spent with patient: 40 minutes: plan of care; medications; therapy needs; dietary needs      Ok Edwards NP [facility]  Contact [phone] Monday through  Friday 8am- 5pm  After hours call [phone]

## 2021-06-15 DIAGNOSIS — I610 Nontraumatic intracerebral hemorrhage in hemisphere, subcortical: Secondary | ICD-10-CM | POA: Diagnosis not present

## 2021-06-15 DIAGNOSIS — Z1159 Encounter for screening for other viral diseases: Secondary | ICD-10-CM | POA: Diagnosis not present

## 2021-06-17 DIAGNOSIS — Z1159 Encounter for screening for other viral diseases: Secondary | ICD-10-CM | POA: Diagnosis not present

## 2021-06-17 DIAGNOSIS — I610 Nontraumatic intracerebral hemorrhage in hemisphere, subcortical: Secondary | ICD-10-CM | POA: Diagnosis not present

## 2021-06-22 DIAGNOSIS — I610 Nontraumatic intracerebral hemorrhage in hemisphere, subcortical: Secondary | ICD-10-CM | POA: Diagnosis not present

## 2021-06-22 DIAGNOSIS — Z1159 Encounter for screening for other viral diseases: Secondary | ICD-10-CM | POA: Diagnosis not present

## 2021-06-24 DIAGNOSIS — Z1159 Encounter for screening for other viral diseases: Secondary | ICD-10-CM | POA: Diagnosis not present

## 2021-06-24 DIAGNOSIS — I610 Nontraumatic intracerebral hemorrhage in hemisphere, subcortical: Secondary | ICD-10-CM | POA: Diagnosis not present

## 2021-06-29 DIAGNOSIS — Z1159 Encounter for screening for other viral diseases: Secondary | ICD-10-CM | POA: Diagnosis not present

## 2021-06-29 DIAGNOSIS — I610 Nontraumatic intracerebral hemorrhage in hemisphere, subcortical: Secondary | ICD-10-CM | POA: Diagnosis not present

## 2021-06-30 DIAGNOSIS — F5105 Insomnia due to other mental disorder: Secondary | ICD-10-CM | POA: Diagnosis not present

## 2021-06-30 DIAGNOSIS — F333 Major depressive disorder, recurrent, severe with psychotic symptoms: Secondary | ICD-10-CM | POA: Diagnosis not present

## 2021-07-01 DIAGNOSIS — Z1159 Encounter for screening for other viral diseases: Secondary | ICD-10-CM | POA: Diagnosis not present

## 2021-07-01 DIAGNOSIS — I610 Nontraumatic intracerebral hemorrhage in hemisphere, subcortical: Secondary | ICD-10-CM | POA: Diagnosis not present

## 2021-07-06 ENCOUNTER — Non-Acute Institutional Stay (SKILLED_NURSING_FACILITY): Payer: Medicare PPO | Admitting: Internal Medicine

## 2021-07-06 ENCOUNTER — Encounter: Payer: Self-pay | Admitting: Internal Medicine

## 2021-07-06 DIAGNOSIS — I152 Hypertension secondary to endocrine disorders: Secondary | ICD-10-CM

## 2021-07-06 DIAGNOSIS — Z1159 Encounter for screening for other viral diseases: Secondary | ICD-10-CM | POA: Diagnosis not present

## 2021-07-06 DIAGNOSIS — N183 Chronic kidney disease, stage 3 unspecified: Secondary | ICD-10-CM

## 2021-07-06 DIAGNOSIS — E1159 Type 2 diabetes mellitus with other circulatory complications: Secondary | ICD-10-CM

## 2021-07-06 DIAGNOSIS — I610 Nontraumatic intracerebral hemorrhage in hemisphere, subcortical: Secondary | ICD-10-CM | POA: Diagnosis not present

## 2021-07-06 DIAGNOSIS — E1151 Type 2 diabetes mellitus with diabetic peripheral angiopathy without gangrene: Secondary | ICD-10-CM | POA: Diagnosis not present

## 2021-07-06 DIAGNOSIS — E1122 Type 2 diabetes mellitus with diabetic chronic kidney disease: Secondary | ICD-10-CM

## 2021-07-06 NOTE — Assessment & Plan Note (Addendum)
DM with neuro, renal, & vascular complications Glucose range @ SNF: 89-241 Current A1c:6.6% A1c goal : < 8% No symptomatic hypoglycemia No change indicated

## 2021-07-06 NOTE — Progress Notes (Signed)
NURSING HOME LOCATION: [facility] ROOM NUMBER:  [id number]  CODE STATUS:  Full Code  PCP:  Ok Edwards NP  This is a nursing facility follow up visit of chronic medical diagnoses & to document compliance with Regulation 483.30 (c) in The [place] Manual Phase 2 which mandates caregiver visit ( visits can alternate among physician, PA or NP as per statutes) within 10 days of 30 days / 60 days/ 90 days post admission to SNF date    Interim medical record and care since last SNF visit was updated with review of diagnostic studies and change in clinical status since last visit were documented.  HPI: He is a permanent resident of this facility with medical diagnoses of history of acute gastric ulcer with hemorrhage, history of acute respiratory failure with hypoxia, A. fib, history of cerebral infarction complicated by aphasia & hemiparesis, history of diverticulosis, diabetes complicated by CKD, CAD with history of MI, GERD,dyslipidemia, history of severe protein/caloric malnutrition, PVD, and history of personality/mood disorder. Surgeries and procedures include CABG and history of gastro/colon percutaneous tube placement.  Review of systems: He was essentially nonverbal.  He did answer with a monosyllabic "no"occasionally but otherwise was nonverbal or would shake his head negatively in response to certain questions.  He seemed to deny any active symptoms.  Constitutional: No fever, significant weight change, fatigue  Eyes: No redness, discharge, pain, vision change ENT/mouth: No nasal congestion,  purulent discharge, earache, change in hearing, sore throat  Cardiovascular: No chest pain, palpitations, paroxysmal nocturnal dyspnea, claudication, edema  Respiratory: No cough, sputum production, hemoptysis, DOE, significant snoring, apnea   Gastrointestinal: No heartburn, dysphagia, abdominal pain, nausea /vomiting, rectal bleeding, melena, change in  bowels Genitourinary: No dysuria, hematuria, pyuria, incontinence, nocturia Musculoskeletal: No joint stiffness, joint swelling, weakness, pain Dermatologic: No rash, pruritus, change in appearance of skin Neurologic: No dizziness, headache, syncope, seizures, numbness, tingling Psychiatric: No significant anxiety, depression, insomnia, anorexia Endocrine: No change in hair/skin/nails, excessive thirst, excessive hunger, excessive urination  Hematologic/lymphatic: No significant bruising, lymphadenopathy, abnormal bleeding Allergy/immunology: No itchy/watery eyes, significant sneezing, urticaria, angioedema  Physical exam:  Pertinent or positive findings: Pattern alopecia is present.  Ptosis is present greater on the left than the right.  When I asked him to open his mouth he shook his head negatively and did not comply. The left nasolabial fold was decreased.  Second heart sound is increased.  Dorsalis pedis pulses are stronger than posterior tibial pulses.  He has trace edema at the sock line.  There is atrophy of the gastrocnemius muscles.  He would not follow commands to assess strength.  There is partial contraction of the fingers of the right hand.  He has irregular hyperpigmented scarring over the shins.  General appearance:  no acute distress, increased work of breathing is present.   Lymphatic: No lymphadenopathy about the head, neck, axilla. Eyes: No conjunctival inflammation or lid edema is present. There is no scleral icterus. Ears:  External ear exam shows no significant lesions or deformities.   Nose:  External nasal examination shows no deformity or inflammation. Nasal mucosa are pink and moist without lesions, exudates Oral exam:  Lips and gums are healthy appearing. There is no oropharyngeal erythema or exudate. Neck:  No thyromegaly, masses, tenderness noted.    Heart:  Normal rate and regular rhythm. S1  normal without gallop, murmur, click, rub .  Lungs:  without wheezes,  rhonchi, rales, rubs. Abdomen: Bowel sounds are normal. Abdomen is  soft and nontender with no organomegaly, hernias, masses. GU: Deferred  Extremities:  No cyanosis, clubbing, edema  Neurologic exam :Balance, [place], finger to nose testing could not be completed due to clinical state Skin: Warm & dry w/o tenting. No significant  rash.  See summary under each active problem in the Problem List with associated updated therapeutic plan

## 2021-07-06 NOTE — Patient Instructions (Signed)
See assessment and plan under each diagnosis in the problem list and acutely for this visit 

## 2021-07-06 NOTE — Assessment & Plan Note (Signed)
BP controlled; no change in antihypertensive medications unless creatinine rises. ARB adjustment should that occur.

## 2021-07-06 NOTE — Assessment & Plan Note (Addendum)
05/21/21 creat 1.75/GFR 40;CKD Stage 3b Medication List reviewed;on Losartan If CKD progresses  ARB will be weaned or discontinued.

## 2021-07-08 DIAGNOSIS — Z1159 Encounter for screening for other viral diseases: Secondary | ICD-10-CM | POA: Diagnosis not present

## 2021-07-08 DIAGNOSIS — I610 Nontraumatic intracerebral hemorrhage in hemisphere, subcortical: Secondary | ICD-10-CM | POA: Diagnosis not present

## 2021-07-12 ENCOUNTER — Non-Acute Institutional Stay (SKILLED_NURSING_FACILITY): Payer: Medicare PPO | Admitting: Adult Health

## 2021-07-12 ENCOUNTER — Encounter: Payer: Self-pay | Admitting: Adult Health

## 2021-07-12 DIAGNOSIS — L602 Onychogryphosis: Secondary | ICD-10-CM | POA: Diagnosis not present

## 2021-07-12 DIAGNOSIS — G2581 Restless legs syndrome: Secondary | ICD-10-CM

## 2021-07-12 DIAGNOSIS — E1140 Type 2 diabetes mellitus with diabetic neuropathy, unspecified: Secondary | ICD-10-CM | POA: Diagnosis not present

## 2021-07-12 NOTE — Progress Notes (Signed)
Location:  [place] Room Number: [id number] Place of Service:  SNF (31)   CODE STATUS: Full code  No Known Allergies  Chief Complaint  Patient presents with  . Acute Visit    Right leg pain    HPI:  He has told his wife that his right left hurts. Staff reports seeing jerky type movements in his right lower extremity. There are indications of pain with palpation. He continues to get out of bed daily. There are no reports of insomnia.   Past Medical History:  Diagnosis Date  . A-fib ([place])    a. Post-op afib after CABG 08/2012.  [name] Acute gastric ulcer with hemorrhage   . Acute respiratory failure with hypoxia ([place])   . Aphasia following cerebral infarction   . CAD (coronary artery disease)    a. NSTEMI s/p stent to distal RCA 03/2000. b. Inf MI s/p emergent thrombectomy/stenting mid RCA 10/2000. c. NSTEMI s/p CABGx4 (LIMA-LAD, SVG-OM2, seq SVG-acute marginal and PD) [id number] - course complicated by confusion, post-op AF, L pleural effusion with thoracentesis. d. Normal LV function by echo 08/2012.  . Diverticulosis   . DM with CKD   . Dysphagia following cerebral infarction   . Encephalopathy   . Extended spectrum beta lactamase (ESBL) resistance   . Generalized anxiety disorder   . GERD (gastroesophageal reflux disease)   . Hemiplegia and hemiparesis following cerebral infarction affecting right dominant side ([place])   . Hiatal hernia   . HLD (hyperlipidemia)   . HTN (hypertension)   . Non-ST elevation (NSTEMI) myocardial infarction ([place])   . Nontraumatic intracerebral hemorrhage in hemisphere, subcortical ([place])   . Peripheral vascular disease ([place])    a. Carotid dopplers neg 08/13/12. b. Pre-cabg ABIs - R=0.87 suggesting mild dz, L=1.29 possibly falsely elevated due to calcified vessels.  . Pleural effusion    a. L pleural eff after CABG s/p thoracentesis 08/22/12.  . Prostate cancer [facility])    Status post radiation treatment.  . Prostatic hypertrophy    a. Hx  of urinary retention, awaiting TURP.  [name] Severe protein-calorie malnutrition ([place])   . Tobacco abuse   . 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 Valvular heart disease    a. Mild  MR by TEE 08/2012.    Past Surgical History:  Procedure Laterality Date  . APPENDECTOMY    . BACK SURGERY    . CORONARY ARTERY BYPASS GRAFT  08/16/2012   Procedure: CORONARY ARTERY BYPASS GRAFTING (CABG);  Surgeon: Melrose Nakayama, MD;  Location: [place];  Service: Open Heart Surgery;  Laterality: N/A;  . IR REPLC GASTRO/COLONIC TUBE PERCUT W/FLUORO  02/13/2019  . LEFT HEART CATHETERIZATION WITH CORONARY ANGIOGRAM N/A 08/14/2012   Procedure: LEFT HEART CATHETERIZATION WITH CORONARY ANGIOGRAM;  Surgeon: Peter M Martinique, MD;  Location: [facility] CATH LAB;  Service: Cardiovascular;  Laterality: N/A;    Social History   Socioeconomic History  . Marital status: Married    Spouse name: Not on file  . Number of children: Not on file  . Years of education: Not on file  . Highest education level: Not on file  Occupational History  . Occupation: retired   Tobacco Use  . Smoking status: Former    Types: Cigarettes    Quit date: 11/12/1972    Years since quitting: 48.6  . Smokeless tobacco: Never  Vaping Use  . Vaping Use: Never used  Substance and Sexual Activity  . Alcohol use: No  .  Drug use: No  . Sexual activity: Not Currently  Other Topics Concern  . Not on file  Social History Narrative   He is a long term patient of [facility]    Social Determinants of Health   Financial Resource Strain: Not on file  Food Insecurity: Not on file  Transportation Needs: Not on file  Physical Activity: Not on file  Stress: Not on file  Social Connections: Not on file  Intimate Partner Violence: Not on file   Family History  Problem Relation Age of Onset  . Hypertension Mother       VITAL SIGNS BP 115/60   Pulse 71   Temp 97.7 F (36.5 C)   Resp 20   Ht '5\' 11"'$  (1.803 m)   Wt 170 lb (77.1 kg)    SpO2 100%   BMI 23.71 kg/m   Outpatient Encounter Medications as of 07/12/2021  Medication Sig  . acetaminophen (TYLENOL) 325 MG tablet Take 650 mg by mouth every 4 (four) hours as needed. per standing order for pain/fever DO NOT EXCEED >3,000 mg in 24 hours  . amLODipine (NORVASC) 5 MG tablet Take 5 mg by mouth daily.   [name] apixaban (ELIQUIS) 5 MG TABS tablet Take 5 mg by mouth 2 (two) times daily.  [name] atorvastatin (LIPITOR) 40 MG tablet Take 40 mg by mouth at bedtime.   . baclofen (LIORESAL) 10 MG tablet Take 1 tablet by mouth 2 (two) times daily. Hold for sedation  . Balsam [place]-Castor Oil (VENELEX) OINT Apply topically. Special Instructions: Apply to sacrum, coccyx and bilateral buttocks qshift for prevention/ redness. Every Shift Day, Evening, Night  . Eyelid Cleansers (OCUSOFT LID SCRUB EX) daily. Cleanse both eye lids  . FLUoxetine (PROZAC) 20 MG/5ML solution Take 10 mg by mouth daily.  . Glucerna (GLUCERNA) LIQD Take 237 mLs by mouth daily.  . hydrocortisone cream 1 % Apply rectally twice daily and as needed for hemorrhoids  . insulin glargine (LANTUS) 100 UNIT/ML injection Inject 20 Units into the skin at bedtime.  . insulin lispro (HUMALOG) 100 UNIT/ML cartridge Inject 10 Units into the skin 3 (three) times daily before meals. Give 10 units SQ with meals if Blood Sugar >150  . lansoprazole (PREVACID SOLUTAB) 30 MG disintegrating tablet Take 30 mg by mouth daily. Give before meals  . loperamide (IMODIUM A-D) 2 MG tablet Take 2 mg by mouth as needed for diarrhea or loose stools.  [name] losartan (COZAAR) 50 MG tablet Take 50 mg by mouth daily.  . Melatonin 1 MG TABS Take 2 tablets by mouth at bedtime.   . NON FORMULARY Diet Change: Regular, thin liquids  . oxybutynin (DITROPAN XL) 15 MG 24 hr tablet Take 15 mg by mouth daily.  . polyethylene glycol (MIRALAX / GLYCOLAX) 17 g packet Take 17 g by mouth daily as needed.  [name] rOPINIRole (REQUIP) 0.25 MG tablet Take 0.25 mg by mouth at bedtime.  .  [DISCONTINUED] apixaban (ELIQUIS) 5 MG TABS tablet Take 2 tablets (10 mg total) by mouth 2 (two) times daily for 6 days.   No facility-administered encounter medications on file as of 07/12/2021.     SIGNIFICANT DIAGNOSTIC EXAMS  REVIEWED   05-12-21: right lower extremity venous doppler: preliminary report: clot from groin to foot.   05-12-21: right lower extremity venous doppler (From ED):   1. Extensive acute mostly occlusive deep venous thrombosis extending from the right common femoral vein into the calf veins.  NO NEW EXAMS.    LABS REVIEWED  PREVIOUS:   07-23-20: hgb a1c 6.2; chol 79; ldl 48; trig 53; hdl 20  10-29-20: glucose 103; bun 39; creat 1.64; k+ 4.0; na++ 140; ca 8.5  01-07-21: wbc 7.8; hgb 15.3; hct 47.7; mcv 93.7 plt 180; glucose 101; bun 45; creat 1.85; k+ 4.4; na++ 140; ca 8.6 GFR 37; liver normal albumin 2.8 chol 101; ldl 60; trig 77; hdl 26; hgb a1c 6.5; tsh 1.908 02-15-21: urine culture: multiple species  03-25-21 hgb a1c 6.6 05-11-21: urine for micro albumin 1299.7 (413.2) on ARB   05-12-21: wbc 10.6; hgb `5.2; hct 47.9; mcv 95.0 plt 181; glucose 233; bun 49; creat 1.98; k+ 4.4; na++ 137; ca 8.2; GFR34 05-14-21: glucose 116; bun 32; creat 1.68; k+ 4.1; na++ 139; ca 8.2; GFR 42 05-15-21: wbc 7.5; hgb 14.7; hct 44.6; mcv 89.7 plt 182  NO NEW LABS.    Review of Systems  Reason unable to perform ROS: aphasia.   Physical Exam Constitutional:      General: He is not in acute distress.    Appearance: He is well-developed. He is not diaphoretic.  Neck:     Thyroid: No thyromegaly.  Cardiovascular:     Rate and Rhythm: Normal rate and regular rhythm.     Heart sounds: Normal heart sounds.     Comments: Hx cabg  Pulmonary:     Effort: Pulmonary effort is normal. No respiratory distress.     Breath sounds: Normal breath sounds.  Abdominal:     General: Bowel sounds are normal. There is no distension.     Palpations: Abdomen is soft.     Tenderness: There is no abdominal  tenderness.  Genitourinary:    Comments: Foley penile tear Musculoskeletal:     Cervical back: Neck supple.     Right lower leg: Edema present.     Comments: Right hemiplegia 1+ right lower extremity edema   Lymphadenopathy:     Cervical: No cervical adenopathy.  Skin:    General: Skin is warm and dry.  Neurological:     Mental Status: He is alert. Mental status is at baseline.  Psychiatric:        Mood and Affect: Mood normal.        Thought Content: Thought content normal.      ASSESSMENT/ PLAN:  TODAY  RLS (restless leg syndrome)  Will begin requip 0.25 mg nightly will monitor his status.    Ok Edwards NP [facility]  Contact [phone] Monday through Friday 8am- 5pm  After hours call [phone]

## 2021-07-13 DIAGNOSIS — G2581 Restless legs syndrome: Secondary | ICD-10-CM | POA: Insufficient documentation

## 2021-07-13 DIAGNOSIS — I610 Nontraumatic intracerebral hemorrhage in hemisphere, subcortical: Secondary | ICD-10-CM | POA: Diagnosis not present

## 2021-07-13 DIAGNOSIS — Z1159 Encounter for screening for other viral diseases: Secondary | ICD-10-CM | POA: Diagnosis not present

## 2021-07-23 ENCOUNTER — Encounter (HOSPITAL_COMMUNITY)
Admission: RE | Admit: 2021-07-23 | Discharge: 2021-07-23 | Disposition: A | Discharge status: Home / Self Care | Payer: Medicare PPO | Source: Skilled Nursing Facility | Attending: Adult Health | Admitting: Adult Health

## 2021-07-23 DIAGNOSIS — R109 Unspecified abdominal pain: Secondary | ICD-10-CM | POA: Insufficient documentation

## 2021-07-23 LAB — CBC WITH DIFFERENTIAL/PLATELET
Abs Immature Granulocytes: 0.02 10*3/uL (ref 0.00–0.07)
Basophils Absolute: 0 10*3/uL (ref 0.0–0.1)
Basophils Relative: 1 %
Eosinophils Absolute: 0.4 10*3/uL (ref 0.0–0.5)
Eosinophils Relative: 5 %
HCT: 50.3 % (ref 39.0–52.0)
Hemoglobin: 15.8 g/dL (ref 13.0–17.0)
Immature Granulocytes: 0 %
Lymphocytes Relative: 19 %
Lymphs Abs: 1.4 10*3/uL (ref 0.7–4.0)
MCH: 29.8 pg (ref 26.0–34.0)
MCHC: 31.4 g/dL (ref 30.0–36.0)
MCV: 94.7 fL (ref 80.0–100.0)
Monocytes Absolute: 0.4 10*3/uL (ref 0.1–1.0)
Monocytes Relative: 6 %
Neutro Abs: 5.1 10*3/uL (ref 1.7–7.7)
Neutrophils Relative %: 69 %
Platelets: 189 10*3/uL (ref 150–400)
RBC: 5.31 MIL/uL (ref 4.22–5.81)
RDW: 13.2 % (ref 11.5–15.5)
WBC: 7.4 10*3/uL (ref 4.0–10.5)
nRBC: 0 % (ref 0.0–0.2)

## 2021-07-23 LAB — URINALYSIS, COMPLETE (UACMP) WITH MICROSCOPIC
Bilirubin Urine: NEGATIVE
Glucose, UA: 50 mg/dL — AB
Ketones, ur: NEGATIVE mg/dL
Nitrite: NEGATIVE
Protein, ur: 100 mg/dL — AB
RBC / HPF: >50 RBC/hpf — ABNORMAL HIGH (ref 0–5)
Specific Gravity, Urine: 1.017 (ref 1.005–1.030)
WBC, UA: >50 WBC/hpf — ABNORMAL HIGH (ref 0–5)
pH: 6 (ref 5.0–8.0)

## 2021-07-26 ENCOUNTER — Non-Acute Institutional Stay (SKILLED_NURSING_FACILITY): Payer: Medicare PPO | Admitting: Adult Health

## 2021-07-26 DIAGNOSIS — B952 Enterococcus as the cause of diseases classified elsewhere: Secondary | ICD-10-CM

## 2021-07-26 DIAGNOSIS — N390 Urinary tract infection, site not specified: Secondary | ICD-10-CM

## 2021-07-26 LAB — URINE CULTURE: Culture: >=100000 — AB

## 2021-07-26 NOTE — Progress Notes (Signed)
Location:  [place] Room Number: 130 Place of Service:  SNF (31)   CODE STATUS: full code   No Known Allergies  Chief Complaint  Patient presents with   Acute Visit    UTI    HPI:  He has developed severe pelvic pain. He is pulling at his foley complaining of pain. There are no reports of fevers. His urine is foul. There are no reports of changes in his appetite or fluid intake.   Past Medical History:  Diagnosis Date   A-fib [facility])    a. Post-op afib after CABG 08/2012.   Acute gastric ulcer with hemorrhage    Acute respiratory failure with hypoxia (HCC)    Aphasia following cerebral infarction    CAD (coronary artery disease)    a. NSTEMI s/p stent to distal RCA 03/2000. b. Inf MI s/p emergent thrombectomy/stenting mid RCA 10/2000. c. NSTEMI s/p CABGx4 (LIMA-LAD, SVG-OM2, seq SVG-acute marginal and PD) [id number] - course complicated by confusion, post-op AF, L pleural effusion with thoracentesis. d. Normal LV function by echo 08/2012.   Diverticulosis    DM with CKD    Dysphagia following cerebral infarction    Encephalopathy    Extended spectrum beta lactamase (ESBL) resistance    Generalized anxiety disorder    GERD (gastroesophageal reflux disease)    Hemiplegia and hemiparesis following cerebral infarction affecting right dominant side (HCC)    Hiatal hernia    HLD (hyperlipidemia)    HTN (hypertension)    Non-ST elevation (NSTEMI) myocardial infarction [facility])    Nontraumatic intracerebral hemorrhage in hemisphere, subcortical [facility])    Peripheral vascular disease ([place])    a. Carotid dopplers neg 08/13/12. b. Pre-cabg ABIs - R=0.87 suggesting mild dz, L=1.29 possibly falsely elevated due to calcified vessels.   Pleural effusion    a. L pleural eff after CABG s/p thoracentesis 08/22/12.   Prostate cancer [facility])    Status post radiation treatment.   Prostatic hypertrophy    a. Hx of urinary retention, awaiting TURP.   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 Mild  MR by TEE 08/2012.    Past Surgical History:  Procedure Laterality Date   APPENDECTOMY     BACK SURGERY     CORONARY ARTERY BYPASS GRAFT  08/16/2012   Procedure: CORONARY ARTERY BYPASS GRAFTING (CABG);  Surgeon: Melrose Nakayama, MD;  Location: [place];  Service: Open Heart Surgery;  Laterality: N/A;   IR REPLC GASTRO/COLONIC TUBE PERCUT W/FLUORO  02/13/2019   LEFT HEART CATHETERIZATION WITH CORONARY ANGIOGRAM N/A 08/14/2012   Procedure: LEFT HEART CATHETERIZATION WITH CORONARY ANGIOGRAM;  Surgeon: Peter M Martinique, MD;  Location: [facility] CATH LAB;  Service: Cardiovascular;  Laterality: N/A;    Social History   Socioeconomic History   Marital status: Married    Spouse name: Not on file   Number of children: Not on file   Years of education: Not on file   Highest education level: Not on file  Occupational History   Occupation: retired   Tobacco Use   Smoking status: Former    Types: Cigarettes    Quit date: 11/12/1972    Years since quitting: 48.7   Smokeless tobacco: Never  Vaping Use   Vaping Use: Never used  Substance and Sexual Activity   Alcohol use: No   Drug use: No  Sexual activity: Not Currently  Other Topics Concern   Not on file  Social History Narrative   He is a long term patient of [facility]    Social Determinants of Health   Financial Resource Strain: Not on file  Food Insecurity: Not on file  Transportation Needs: Not on file  Physical Activity: Not on file  Stress: Not on file  Social Connections: Not on file  Intimate Partner Violence: Not on file   Family History  Problem Relation Age of Onset   Hypertension Mother       VITAL SIGNS BP 122/79   Pulse 62   Temp 98 F (36.7 C)   Ht '5\' 11"'$  (1.803 m)   Wt 166 lb 9.6 oz (75.6 kg)   BMI 23.24 kg/m   Outpatient Encounter Medications as of 07/26/2021  Medication Sig    acetaminophen (TYLENOL) 325 MG tablet Take 650 mg by mouth every 4 (four) hours as needed. per standing order for pain/fever DO NOT EXCEED >3,000 mg in 24 hours   amLODipine (NORVASC) 5 MG tablet Take 5 mg by mouth daily.    apixaban (ELIQUIS) 5 MG TABS tablet Take 5 mg by mouth 2 (two) times daily.   atorvastatin (LIPITOR) 40 MG tablet Take 40 mg by mouth at bedtime.    baclofen (LIORESAL) 10 MG tablet Take 1 tablet by mouth 2 (two) times daily. Hold for sedation   Balsam [place]-Castor Oil (VENELEX) OINT Apply topically. Special Instructions: Apply to sacrum, coccyx and bilateral buttocks qshift for prevention/ redness. Every Shift Day, Evening, Night   Eyelid Cleansers (OCUSOFT LID SCRUB EX) daily. Cleanse both eye lids   FLUoxetine (PROZAC) 20 MG/5ML solution Take 10 mg by mouth daily.   Glucerna (GLUCERNA) LIQD Take 237 mLs by mouth daily.   hydrocortisone cream 1 % Apply rectally twice daily and as needed for hemorrhoids   insulin glargine (LANTUS) 100 UNIT/ML injection Inject 20 Units into the skin at bedtime.   insulin lispro (HUMALOG) 100 UNIT/ML cartridge Inject 10 Units into the skin 3 (three) times daily before meals. Give 10 units SQ with meals if Blood Sugar >150   lansoprazole (PREVACID SOLUTAB) 30 MG disintegrating tablet Take 30 mg by mouth daily. Give before meals   loperamide (IMODIUM A-D) 2 MG tablet Take 2 mg by mouth as needed for diarrhea or loose stools.   losartan (COZAAR) 50 MG tablet Take 50 mg by mouth daily.   Melatonin 1 MG TABS Take 2 tablets by mouth at bedtime.    NON FORMULARY Diet Change: Regular, thin liquids   oxybutynin (DITROPAN XL) 15 MG 24 hr tablet Take 15 mg by mouth daily.   polyethylene glycol (MIRALAX / GLYCOLAX) 17 g packet Take 17 g by mouth daily as needed.   rOPINIRole (REQUIP) 0.25 MG tablet Take 0.25 mg by mouth at bedtime.   No facility-administered encounter medications on file as of 07/26/2021.     SIGNIFICANT DIAGNOSTIC  EXAMS   REVIEWED   05-12-21: right lower extremity venous doppler: preliminary report: clot from groin to foot.   05-12-21: right lower extremity venous doppler (From ED):   1. Extensive acute mostly occlusive deep venous thrombosis extending from the right common femoral vein into the calf veins.  NO NEW EXAMS.    LABS REVIEWED PREVIOUS:   07-23-20: hgb a1c 6.2; chol 79; ldl 48; trig 53; hdl 20  10-29-20: glucose 103; bun 39; creat 1.64; k+ 4.0; na++ 140; ca 8.5  01-07-21: wbc 7.8; hgb  15.3; hct 47.7; mcv 93.7 plt 180; glucose 101; bun 45; creat 1.85; k+ 4.4; na++ 140; ca 8.6 GFR 37; liver normal albumin 2.8 chol 101; ldl 60; trig 77; hdl 26; hgb a1c 6.5; tsh 1.908 02-15-21: urine culture: multiple species  03-25-21 hgb a1c 6.6 05-11-21: urine for micro albumin 1299.7 (413.2) on ARB   05-12-21: wbc 10.6; hgb `5.2; hct 47.9; mcv 95.0 plt 181; glucose 233; bun 49; creat 1.98; k+ 4.4; na++ 137; ca 8.2; GFR34 05-14-21: glucose 116; bun 32; creat 1.68; k+ 4.1; na++ 139; ca 8.2; GFR 42 05-15-21: wbc 7.5; hgb 14.7; hct 44.6; mcv 89.7 plt 182  TODAY  07-23-21: enterococcus faecalis: macrobid    Review of Systems  Reason unable to perform ROS: aphasia.   Physical Exam Constitutional:      General: He is not in acute distress.    Appearance: He is well-developed. He is not diaphoretic.  Neck:     Thyroid: No thyromegaly.  Cardiovascular:     Rate and Rhythm: Normal rate and regular rhythm.     Heart sounds: Normal heart sounds.     Comments: Hx cabg  Pulmonary:     Effort: Pulmonary effort is normal. No respiratory distress.     Breath sounds: Normal breath sounds.  Abdominal:     General: Bowel sounds are normal. There is no distension.     Palpations: Abdomen is soft.     Tenderness: There is no abdominal tenderness.  Genitourinary:    Comments: Foley Penile tear  Musculoskeletal:     Cervical back: Neck supple.     Right lower leg: No edema.     Left lower leg: No edema.      Comments:  Right hemiplegia  Lymphadenopathy:     Cervical: No cervical adenopathy.  Skin:    General: Skin is warm and dry.  Neurological:     Mental Status: He is alert. Mental status is at baseline.  Psychiatric:        Mood and Affect: Mood normal.     ASSESSMENT/ PLAN:  TODAY  Enterococcus faecalis infection: UTI: will begin nitrofurantoin 100 mg twice daily through 08-02-21 will monitor his status.     Ok Edwards NP [facility]  Contact [phone] Monday through Friday 8am- 5pm  After hours call [phone]

## 2021-08-02 DIAGNOSIS — B952 Enterococcus as the cause of diseases classified elsewhere: Secondary | ICD-10-CM | POA: Insufficient documentation

## 2021-08-06 ENCOUNTER — Non-Acute Institutional Stay (SKILLED_NURSING_FACILITY): Payer: Medicare PPO | Admitting: Adult Health

## 2021-08-06 ENCOUNTER — Encounter: Payer: Self-pay | Admitting: Adult Health

## 2021-08-06 DIAGNOSIS — E1159 Type 2 diabetes mellitus with other circulatory complications: Secondary | ICD-10-CM | POA: Diagnosis not present

## 2021-08-06 DIAGNOSIS — E1169 Type 2 diabetes mellitus with other specified complication: Secondary | ICD-10-CM | POA: Diagnosis not present

## 2021-08-06 DIAGNOSIS — I2510 Atherosclerotic heart disease of native coronary artery without angina pectoris: Secondary | ICD-10-CM

## 2021-08-06 DIAGNOSIS — I152 Hypertension secondary to endocrine disorders: Secondary | ICD-10-CM | POA: Diagnosis not present

## 2021-08-06 DIAGNOSIS — I82491 Acute embolism and thrombosis of other specified deep vein of right lower extremity: Secondary | ICD-10-CM | POA: Diagnosis not present

## 2021-08-06 DIAGNOSIS — E785 Hyperlipidemia, unspecified: Secondary | ICD-10-CM | POA: Diagnosis not present

## 2021-08-06 NOTE — Progress Notes (Signed)
Location:  [place] Room Number: [id number] Place of Service:  SNF (31)   CODE STATUS: Full code  No Known Allergies  Chief Complaint  Patient presents with   Medical Management of Chronic Issues          Acute deep vein thrombosis (DVT) of femoral vein of right leg . CAD native heart without angina:  Hypertension associated with type 2 diabetes mellitus: . Dyslipidemia associated with type 2 diabetes mellitus    HPI:  He is a 75 year old long term resident of this facility being seen for the management of his chronic illnesses: Acute deep vein thrombosis (DVT) of femoral vein of right leg . CAD native heart without angina:  Hypertension associated with type 2 diabetes mellitus: . Dyslipidemia associated with type 2 diabetes mellitus.  There are no reports of uncontrolled pain. He has not had any further complications from his UTI. There are no changes in appetite; no reports of anxiety   Past Medical History:  Diagnosis Date   A-fib ([place])    a. Post-op afib after CABG 08/2012.   Acute gastric ulcer with hemorrhage    Acute respiratory failure with hypoxia (HCC)    Aphasia following cerebral infarction    CAD (coronary artery disease)    a. NSTEMI s/p stent to distal RCA 03/2000. b. Inf MI s/p emergent thrombectomy/stenting mid RCA 10/2000. c. NSTEMI s/p CABGx4 (LIMA-LAD, SVG-OM2, seq SVG-acute marginal and PD) [id number] - course complicated by confusion, post-op AF, L pleural effusion with thoracentesis. d. Normal LV function by echo 08/2012.   Diverticulosis    DM with CKD    Dysphagia following cerebral infarction    Encephalopathy    Extended spectrum beta lactamase (ESBL) resistance    Generalized anxiety disorder    GERD (gastroesophageal reflux disease)    Hemiplegia and hemiparesis following cerebral infarction affecting right dominant side (HCC)    Hiatal hernia    HLD (hyperlipidemia)    HTN (hypertension)    Non-ST elevation (NSTEMI) myocardial  infarction [facility])    Nontraumatic intracerebral hemorrhage in hemisphere, subcortical [facility])    Peripheral vascular disease ([place])    a. Carotid dopplers neg 08/13/12. b. Pre-cabg ABIs - R=0.87 suggesting mild dz, L=1.29 possibly falsely elevated due to calcified vessels.   Pleural effusion    a. L pleural eff after CABG s/p thoracentesis 08/22/12.   Prostate cancer [facility])    Status post radiation treatment.   Prostatic hypertrophy    a. Hx of urinary retention, awaiting TURP.   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 Mild  MR by TEE 08/2012.    Past Surgical History:  Procedure Laterality Date   APPENDECTOMY     BACK SURGERY     CORONARY ARTERY BYPASS GRAFT  08/16/2012   Procedure: CORONARY ARTERY BYPASS GRAFTING (CABG);  Surgeon: Melrose Nakayama, MD;  Location: [place];  Service: Open Heart Surgery;  Laterality: N/A;   IR REPLC GASTRO/COLONIC TUBE PERCUT W/FLUORO  02/13/2019   LEFT HEART CATHETERIZATION WITH CORONARY ANGIOGRAM N/A 08/14/2012   Procedure: LEFT HEART CATHETERIZATION WITH CORONARY ANGIOGRAM;  Surgeon: Peter M Martinique, MD;  Location: [facility] CATH LAB;  Service: Cardiovascular;  Laterality: N/A;    Social History   Socioeconomic History   Marital status: Married    Spouse name: Not on file   Number of children:  Not on file   Years of education: Not on file   Highest education level: Not on file  Occupational History   Occupation: retired   Tobacco Use   Smoking status: Former    Types: Cigarettes    Quit date: 11/12/1972    Years since quitting: 48.7   Smokeless tobacco: Never  Vaping Use   Vaping Use: Never used  Substance and Sexual Activity   Alcohol use: No   Drug use: No   Sexual activity: Not Currently  Other Topics Concern   Not on file  Social History Narrative   He is a long term patient of [place]    Social Determinants of Health   Financial Resource  Strain: Not on file  Food Insecurity: Not on file  Transportation Needs: Not on file  Physical Activity: Not on file  Stress: Not on file  Social Connections: Not on file  Intimate Partner Violence: Not on file   Family History  Problem Relation Age of Onset   Hypertension Mother       VITAL SIGNS BP 127/70   Pulse 72   Temp 98 F (36.7 C)   Resp 16   Ht '5\' 11"'$  (1.803 m)   Wt 166 lb 9.6 oz (75.6 kg)   SpO2 100%   BMI 23.24 kg/m   Outpatient Encounter Medications as of 08/06/2021  Medication Sig   acetaminophen (TYLENOL) 325 MG tablet Take 650 mg by mouth every 4 (four) hours as needed. per standing order for pain/fever DO NOT EXCEED >3,000 mg in 24 hours   amLODipine (NORVASC) 5 MG tablet Take 5 mg by mouth daily.    apixaban (ELIQUIS) 5 MG TABS tablet Take 5 mg by mouth 2 (two) times daily.   atorvastatin (LIPITOR) 40 MG tablet Take 40 mg by mouth at bedtime.    baclofen (LIORESAL) 10 MG tablet Take 1 tablet by mouth 2 (two) times daily. Hold for sedation   Balsam [place]-Castor Oil (VENELEX) OINT Apply topically. Special Instructions: Apply to sacrum, coccyx and bilateral buttocks qshift for prevention/ redness. Every Shift Day, Evening, Night   Eyelid Cleansers (OCUSOFT LID SCRUB EX) daily. Cleanse both eye lids   Glucerna (GLUCERNA) LIQD Take 237 mLs by mouth daily.   hydrocortisone cream 1 % Apply rectally twice daily and as needed for hemorrhoids   insulin glargine (LANTUS) 100 UNIT/ML injection Inject 20 Units into the skin at bedtime.   insulin lispro (HUMALOG) 100 UNIT/ML cartridge Inject 10 Units into the skin 3 (three) times daily before meals. Give 10 units SQ with meals if Blood Sugar >150   lansoprazole (PREVACID SOLUTAB) 30 MG disintegrating tablet Take 30 mg by mouth daily. Give before meals   loperamide (IMODIUM A-D) 2 MG tablet Take 2 mg by mouth as needed for diarrhea or loose stools.   losartan (COZAAR) 50 MG tablet Take 50 mg by mouth daily.   Melatonin 1  MG TABS Take 2 tablets by mouth at bedtime.    NON FORMULARY Diet Change: Regular, thin liquids   oxybutynin (DITROPAN XL) 15 MG 24 hr tablet Take 15 mg by mouth daily.   polyethylene glycol (MIRALAX / GLYCOLAX) 17 g packet Take 17 g by mouth daily as needed.   rOPINIRole (REQUIP) 0.25 MG tablet Take 0.25 mg by mouth at bedtime.   [DISCONTINUED] FLUoxetine (PROZAC) 20 MG/5ML solution Take 10 mg by mouth daily.   No facility-administered encounter medications on file as of 08/06/2021.     SIGNIFICANT DIAGNOSTIC  EXAMS  PREVIOUS   05-12-21: right lower extremity venous doppler: preliminary report: clot from groin to foot.   05-12-21: right lower extremity venous doppler (From ED):   1. Extensive acute mostly occlusive deep venous thrombosis extending from the right common femoral vein into the calf veins.  NO NEW EXAMS.    LABS REVIEWED PREVIOUS:   07-23-20: hgb a1c 6.2; chol 79; ldl 48; trig 53; hdl 20  10-29-20: glucose 103; bun 39; creat 1.64; k+ 4.0; na++ 140; ca 8.5  01-07-21: wbc 7.8; hgb 15.3; hct 47.7; mcv 93.7 plt 180; glucose 101; bun 45; creat 1.85; k+ 4.4; na++ 140; ca 8.6 GFR 37; liver normal albumin 2.8 chol 101; ldl 60; trig 77; hdl 26; hgb a1c 6.5; tsh 1.908 02-15-21: urine culture: multiple species  03-25-21 hgb a1c 6.6 05-11-21: urine for micro albumin 1299.7 (413.2) on ARB   05-12-21: wbc 10.6; hgb `15.2; hct 47.9; mcv 95.0 plt 181; glucose 233; bun 49; creat 1.98; k+ 4.4; na++ 137; ca 8.2; GFR34 05-14-21: glucose 116; bun 32; creat 1.68; k+ 4.1; na++ 139; ca 8.2; GFR 42 05-15-21: wbc 7.5; hgb 14.7; hct 44.6; mcv 89.7 plt 182   TODAY  05-21-21: wbc 8.6; hgb 13.6; hct 42.9; mcv 93.5 plt 209; glucose 147; bun 35; creat 1.75; k+ 4.0 na++ 8.1; GFR 40 07-23-21: wbc 7.4; hgb 15.8; hct 50.3; mcv 94.7 plt 189; urine culture: enterococcus faecalis/providencia stuartii    Review of Systems  Reason unable to perform ROS: expressive aphasia.   Physical Exam Constitutional:       General: He is not in acute distress.    Appearance: He is well-developed. He is not diaphoretic.  Neck:     Thyroid: No thyromegaly.  Cardiovascular:     Rate and Rhythm: Normal rate and regular rhythm.     Pulses: Normal pulses.     Heart sounds: Normal heart sounds.     Comments: Hx: cabg  Pulmonary:     Effort: Pulmonary effort is normal. No respiratory distress.     Breath sounds: Normal breath sounds.  Abdominal:     General: Bowel sounds are normal. There is no distension.     Palpations: Abdomen is soft.     Tenderness: There is no abdominal tenderness.  Genitourinary:    Comments: Foley penile tear  Musculoskeletal:     Cervical back: Neck supple.     Right lower leg: No edema.     Left lower leg: No edema.     Comments: Right hemiplegia  Lymphadenopathy:     Cervical: No cervical adenopathy.  Skin:    General: Skin is warm and dry.  Neurological:     Mental Status: He is alert. Mental status is at baseline.  Psychiatric:        Mood and Affect: Mood normal.      ASSESSMENT/ PLAN:  TODAY:   Acute deep vein thrombosis (DVT) of femoral vein of right leg (05-12-21) is stable will continue eliquis 5 mg twice daily   2. CAD native heart without angina: is status post cabg 2012: will continue cozaar 50 mg daily   3. Hypertension associated with type 2 diabetes mellitus: is stable b/p 127/70 will continue norvasc 5 mg daily cozaar 50 mg daily   4. Dyslipidemia associated with type 2 diabetes mellitus: is stable LDL 77 will continue lipitor 40 mg daily   PREVIOUS   5. Micro-albumin due to type 2 diabetes mellitus: is worse: 1299.7 is on ARB  6.  Major depression with psychotic feature: is stable  is off prozac buspar and antipsychotics  7. CKD stage 3 due to type 2 diabetes mellitus: is stable bun 35; creat 1.72; GFR 40  8. Chronic urine retention; with bladder spasms: is stable will continue ditropan xl 15 mg daily   9. Type 2 diabetes mellitus with peripheral  vascular disease: is stable hgb a1c 6.7 will continue lantus 20 units nightly and humalog 10 units with meals  Is on statin arb; eliquis   10. Protein calorie malnutrition severe: is stable weight is 165 pounds albumin 2.2 will continue supplements as indicated  11. Nontraumatic subcortical hemorrhage or left cerebral hemisphere/right spastic hemiplegia: is stable will continue baclofen 10 mg twice daily for spasticity   12. Aortic atherosclerosis (ct 01-04-19) will monitor     Ok Edwards NP [facility] Adult Medicine  Contact [phone] Monday through Friday 8am- 5pm  After hours call [phone]

## 2021-08-09 ENCOUNTER — Encounter (HOSPITAL_COMMUNITY): Payer: Self-pay | Admitting: Emergency Medicine

## 2021-08-09 ENCOUNTER — Other Ambulatory Visit: Payer: Self-pay

## 2021-08-09 ENCOUNTER — Emergency Department (HOSPITAL_COMMUNITY)
Admission: EM | Admit: 2021-08-09 | Discharge: 2021-08-10 | Disposition: A | Discharge status: Home / Self Care | Payer: Medicare PPO | Attending: Emergency Medicine | Admitting: Emergency Medicine

## 2021-08-09 ENCOUNTER — Emergency Department (HOSPITAL_COMMUNITY): Payer: Medicare PPO

## 2021-08-09 DIAGNOSIS — Z951 Presence of aortocoronary bypass graft: Secondary | ICD-10-CM | POA: Diagnosis not present

## 2021-08-09 DIAGNOSIS — R531 Weakness: Secondary | ICD-10-CM | POA: Diagnosis not present

## 2021-08-09 DIAGNOSIS — N1832 Chronic kidney disease, stage 3b: Secondary | ICD-10-CM

## 2021-08-09 DIAGNOSIS — R4182 Altered mental status, unspecified: Secondary | ICD-10-CM

## 2021-08-09 DIAGNOSIS — I2510 Atherosclerotic heart disease of native coronary artery without angina pectoris: Secondary | ICD-10-CM | POA: Diagnosis not present

## 2021-08-09 DIAGNOSIS — Z79899 Other long term (current) drug therapy: Secondary | ICD-10-CM | POA: Diagnosis not present

## 2021-08-09 DIAGNOSIS — I4891 Unspecified atrial fibrillation: Secondary | ICD-10-CM | POA: Diagnosis not present

## 2021-08-09 DIAGNOSIS — Z794 Long term (current) use of insulin: Secondary | ICD-10-CM | POA: Diagnosis not present

## 2021-08-09 DIAGNOSIS — E1122 Type 2 diabetes mellitus with diabetic chronic kidney disease: Secondary | ICD-10-CM | POA: Insufficient documentation

## 2021-08-09 DIAGNOSIS — N183 Chronic kidney disease, stage 3 unspecified: Secondary | ICD-10-CM | POA: Insufficient documentation

## 2021-08-09 DIAGNOSIS — Z8659 Personal history of other mental and behavioral disorders: Secondary | ICD-10-CM

## 2021-08-09 DIAGNOSIS — Z87891 Personal history of nicotine dependence: Secondary | ICD-10-CM | POA: Diagnosis not present

## 2021-08-09 DIAGNOSIS — I129 Hypertensive chronic kidney disease with stage 1 through stage 4 chronic kidney disease, or unspecified chronic kidney disease: Secondary | ICD-10-CM | POA: Insufficient documentation

## 2021-08-09 DIAGNOSIS — Z8546 Personal history of malignant neoplasm of prostate: Secondary | ICD-10-CM | POA: Diagnosis not present

## 2021-08-09 DIAGNOSIS — Z7901 Long term (current) use of anticoagulants: Secondary | ICD-10-CM | POA: Insufficient documentation

## 2021-08-09 DIAGNOSIS — E43 Unspecified severe protein-calorie malnutrition: Secondary | ICD-10-CM

## 2021-08-09 DIAGNOSIS — R404 Transient alteration of awareness: Secondary | ICD-10-CM | POA: Diagnosis not present

## 2021-08-09 HISTORY — DX: Cerebral infarction, unspecified: I63.9

## 2021-08-09 LAB — COMPREHENSIVE METABOLIC PANEL
ALT: 34 U/L (ref 0–44)
AST: 27 U/L (ref 15–41)
Albumin: 2.6 g/dL — ABNORMAL LOW (ref 3.5–5.0)
Alkaline Phosphatase: 130 U/L — ABNORMAL HIGH (ref 38–126)
Anion gap: 3 — ABNORMAL LOW (ref 5–15)
BUN: 34 mg/dL — ABNORMAL HIGH (ref 8–23)
CO2: 28 mmol/L (ref 22–32)
Calcium: 7.8 mg/dL — ABNORMAL LOW (ref 8.9–10.3)
Chloride: 109 mmol/L (ref 98–111)
Creatinine, Ser: 1.83 mg/dL — ABNORMAL HIGH (ref 0.61–1.24)
GFR, Estimated: 38 mL/min — ABNORMAL LOW (ref >60)
Glucose, Bld: 127 mg/dL — ABNORMAL HIGH (ref 70–99)
Potassium: 4.1 mmol/L (ref 3.5–5.1)
Sodium: 140 mmol/L (ref 135–145)
Total Bilirubin: 0.4 mg/dL (ref 0.3–1.2)
Total Protein: 5.9 g/dL — ABNORMAL LOW (ref 6.5–8.1)

## 2021-08-09 LAB — CBC
HCT: 45.3 % (ref 39.0–52.0)
Hemoglobin: 14.5 g/dL (ref 13.0–17.0)
MCH: 30 pg (ref 26.0–34.0)
MCHC: 32 g/dL (ref 30.0–36.0)
MCV: 93.8 fL (ref 80.0–100.0)
Platelets: 192 10*3/uL (ref 150–400)
RBC: 4.83 MIL/uL (ref 4.22–5.81)
RDW: 13.5 % (ref 11.5–15.5)
WBC: 8.3 10*3/uL (ref 4.0–10.5)
nRBC: 0 % (ref 0.0–0.2)

## 2021-08-09 NOTE — ED Provider Notes (Signed)
[facility] EMERGENCY DEPARTMENT Provider Note   CSN: [id number] Arrival date & time: 08/09/21  2207     History Chief Complaint  Patient presents with   Altered Mental Status    [name] is a 75 y.o. male.  Pt with hx afib, prior cva, dementia, with decreased verbal responsiveness today, and sleeping more than normal. Pt limited historian - level 5 caveat. Pt denies pain. No headache. No chest pain. No sob. No fevers noted. Has foley - urine in tube cloudy. No report of trauma/fall. No reports of change in meds/new meds.   The history is provided by the patient, medical records and the nursing home. The history is limited by the condition of the patient.  Altered Mental Status Associated symptoms: no abdominal pain, no fever and no headaches       Past Medical History:  Diagnosis Date   A-fib ([place])    a. Post-op afib after CABG 08/2012.   Acute gastric ulcer with hemorrhage    Acute respiratory failure with hypoxia (HCC)    Aphasia following cerebral infarction    CAD (coronary artery disease)    a. NSTEMI s/p stent to distal RCA 03/2000. b. Inf MI s/p emergent thrombectomy/stenting mid RCA 10/2000. c. NSTEMI s/p CABGx4 (LIMA-LAD, SVG-OM2, seq SVG-acute marginal and PD) [id number] - course complicated by confusion, post-op AF, L pleural effusion with thoracentesis. d. Normal LV function by echo 08/2012.   Diverticulosis    DM with CKD    Dysphagia following cerebral infarction    Encephalopathy    Extended spectrum beta lactamase (ESBL) resistance    Generalized anxiety disorder    GERD (gastroesophageal reflux disease)    Hemiplegia and hemiparesis following cerebral infarction affecting right dominant side (HCC)    Hiatal hernia    HLD (hyperlipidemia)    HTN (hypertension)    Non-ST elevation (NSTEMI) myocardial infarction [facility])    Nontraumatic intracerebral hemorrhage in hemisphere, subcortical [facility])    Peripheral vascular disease ([place])    a. Carotid dopplers neg  08/13/12. b. Pre-cabg ABIs - R=0.87 suggesting mild dz, L=1.29 possibly falsely elevated due to calcified vessels.   Pleural effusion    a. L pleural eff after CABG s/p thoracentesis 08/22/12.   Prostate cancer [facility])    Status post radiation treatment.   Prostatic hypertrophy    a. Hx of urinary retention, awaiting TURP.   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 Mild  MR by TEE 08/2012.    Patient Active Problem List   Diagnosis Date Noted   Enterococcus faecalis infection 08/02/2021   RLS (restless legs syndrome) 07/13/2021   Acute deep vein thrombosis (DVT) of right lower extremity ([place]) 05/12/2021   Bladder spasms 02/12/2021   Aortic atherosclerosis ([place]) 12/17/2020   Expressive aphasia 11/18/2020   Bruise 11/18/2020   Right spastic hemiplegia ([place]) 05/27/2020   Microalbuminuria due to type 2 diabetes mellitus ([place]) 06/04/2019   CKD stage 3 due to type 2 diabetes mellitus ([place]) 06/04/2019   Foley catheter in place 04/25/2019   Penis injury, sequela 04/25/2019   Hypertension associated with diabetes ([place]) 01/28/2019   Dyslipidemia associated with type 2 diabetes mellitus ([place]) 01/28/2019   Dysphagia, oropharyngeal phase 01/28/2019   Type 2 diabetes mellitus with peripheral vascular disease ([place]) 01/28/2019   Major depression with psychotic features ([place]) 01/28/2019  Protein-calorie malnutrition, severe 01/08/2019   History of hemorrhagic cerebrovascular accident (CVA) with residual deficit 01/04/2019   Urinary retention 12/24/2018   Nontraumatic subcortical hemorrhage of left cerebral hemisphere [facility]) 11/18/2018   CAD (coronary artery disease)    Peripheral vascular disease ([place])    A-fib One Episode after CABG in 2013    HTN (hypertension) 08/15/2012    Past Surgical History:  Procedure Laterality Date   APPENDECTOMY     BACK SURGERY     CORONARY  ARTERY BYPASS GRAFT  08/16/2012   Procedure: CORONARY ARTERY BYPASS GRAFTING (CABG);  Surgeon: Melrose Nakayama, MD;  Location: [place];  Service: Open Heart Surgery;  Laterality: N/A;   IR REPLC GASTRO/COLONIC TUBE PERCUT W/FLUORO  02/13/2019   LEFT HEART CATHETERIZATION WITH CORONARY ANGIOGRAM N/A 08/14/2012   Procedure: LEFT HEART CATHETERIZATION WITH CORONARY ANGIOGRAM;  Surgeon: Peter M Martinique, MD;  Location: [facility] CATH LAB;  Service: Cardiovascular;  Laterality: N/A;       Family History  Problem Relation Age of Onset   Hypertension Mother     Social History   Tobacco Use   Smoking status: Former    Types: Cigarettes    Quit date: 11/12/1972    Years since quitting: 48.7   Smokeless tobacco: Never  Vaping Use   Vaping Use: Never used  Substance Use Topics   Alcohol use: No   Drug use: No    Home Medications Prior to Admission medications   Medication Sig Start Date End Date Taking? Authorizing Provider  acetaminophen (TYLENOL) 325 MG tablet Take 650 mg by mouth every 4 (four) hours as needed. per standing order for pain/fever DO NOT EXCEED >3,000 mg in 24 hours 06/16/19   [provider]  amLODipine (NORVASC) 5 MG tablet Take 5 mg by mouth daily.     [provider]  apixaban (ELIQUIS) 5 MG TABS tablet Take 5 mg by mouth 2 (two) times daily.    [provider]  atorvastatin (LIPITOR) 40 MG tablet Take 40 mg by mouth at bedtime.  01/29/19   [provider]  baclofen (LIORESAL) 10 MG tablet Take 1 tablet by mouth 2 (two) times daily. Hold for sedation 04/03/19   [provider]  Janne Lab Oil [facility]) OINT Apply topically. Special Instructions: Apply to sacrum, coccyx and bilateral buttocks qshift for prevention/ redness. Every Shift Day, Evening, Night 01/24/21   [provider]  Eyelid Cleansers (OCUSOFT LID SCRUB EX) daily. Cleanse both eye lids    [provider]  Glucerna (GLUCERNA) LIQD Take 237 mLs by  mouth daily.    [provider]  hydrocortisone cream 1 % Apply rectally twice daily and as needed for hemorrhoids 01/12/19   [provider]  insulin glargine (LANTUS) 100 UNIT/ML injection Inject 20 Units into the skin at bedtime.    [provider]  insulin lispro (HUMALOG) 100 UNIT/ML cartridge Inject 10 Units into the skin 3 (three) times daily before meals. Give 10 units SQ with meals if Blood Sugar >150    [provider]  lansoprazole (PREVACID SOLUTAB) 30 MG disintegrating tablet Take 30 mg by mouth daily. Give before meals 04/03/19   [provider]  loperamide (IMODIUM A-D) 2 MG tablet Take 2 mg by mouth as needed for diarrhea or loose stools. 12/19/20   [provider]  losartan (COZAAR) 50 MG tablet Take 50 mg by mouth daily.    [provider]  Melatonin 1 MG TABS Take 2 tablets  by mouth at bedtime.  01/30/19   [provider]  NON FORMULARY Diet Change: Regular, thin liquids 06/21/19   [provider]  oxybutynin (DITROPAN XL) 15 MG 24 hr tablet Take 15 mg by mouth daily.    [provider]  polyethylene glycol (MIRALAX / GLYCOLAX) 17 g packet Take 17 g by mouth daily as needed. 08/24/20   [provider]  rOPINIRole (REQUIP) 0.25 MG tablet Take 0.25 mg by mouth at bedtime.    [provider]    Allergies    Patient has no known allergies.  Review of Systems   Review of Systems  Unable to perform ROS: Mental status change  Constitutional:  Negative for fever.  Respiratory:  Negative for shortness of breath.   Cardiovascular:  Negative for chest pain.  Gastrointestinal:  Negative for abdominal pain.  Neurological:  Negative for headaches.  Level 5 caveat - altered mental status   Physical Exam Updated Vital Signs BP 107/73   Pulse 61   Temp 98.5 F (36.9 C) (Oral)   Resp 12   Ht 1.803 m ('5\' 11"'$ )   Wt 75 kg   SpO2 98%   BMI 23.06 kg/m   Physical Exam Vitals and  nursing note reviewed.  Constitutional:      Appearance: Normal appearance. He is well-developed.  HENT:     Head: Atraumatic.     Nose: Nose normal.     Mouth/Throat:     Mouth: Mucous membranes are moist.     Pharynx: Oropharynx is clear.  Eyes:     General: No scleral icterus.    Conjunctiva/sclera: Conjunctivae normal.     Pupils: Pupils are equal, round, and reactive to light.  Neck:     Vascular: No carotid bruit.     Trachea: No tracheal deviation.     Comments: No stiffness or  rigidity.  Cardiovascular:     Rate and Rhythm: Normal rate and regular rhythm.     Pulses: Normal pulses.     Heart sounds: Normal heart sounds. No murmur heard.   No friction rub. No gallop.  Pulmonary:     Effort: Pulmonary effort is normal. No accessory muscle usage or respiratory distress.     Breath sounds: Normal breath sounds.  Abdominal:     General: Bowel sounds are normal. There is no distension.     Palpations: Abdomen is soft.     Tenderness: There is no abdominal tenderness. There is no guarding.  Genitourinary:    Comments: No cva tenderness. Normal external gu exam, foley cath in place, cloudy urine flowing in tubing.  Musculoskeletal:        General: No swelling or tenderness.     Cervical back: Normal range of motion and neck supple. No rigidity.  Skin:    General: Skin is warm and dry.     Findings: No rash.  Neurological:     Mental Status: He is alert.     Comments: Resting. Easily aroused. Answers some questions with simple yes/no. Moves purposefully.   Psychiatric:        Mood and Affect: Mood normal.    ED Results / Procedures / Treatments   Labs (all labs ordered are listed, but only abnormal results are displayed) Results for orders placed or performed during the hospital encounter of 08/09/21  CBC  Result Value Ref Range   WBC 8.3 4.0 - 10.5 K/uL   RBC 4.83 4.22 - 5.81 MIL/uL   Hemoglobin 14.5  13.0 - 17.0 g/dL   HCT 45.3 39.0 - 52.0 %   MCV 93.8 80.0 -  100.0 fL   MCH 30.0 26.0 - 34.0 pg   MCHC 32.0 30.0 - 36.0 g/dL   RDW 13.5 11.5 - 15.5 %   Platelets 192 150 - 400 K/uL   nRBC 0.0 0.0 - 0.2 %  Comprehensive metabolic panel  Result Value Ref Range   Sodium 140 135 - 145 mmol/L   Potassium 4.1 3.5 - 5.1 mmol/L   Chloride 109 98 - 111 mmol/L   CO2 28 22 - 32 mmol/L   Glucose, Bld 127 (H) 70 - 99 mg/dL   BUN 34 (H) 8 - 23 mg/dL   Creatinine, Ser 1.83 (H) 0.61 - 1.24 mg/dL   Calcium 7.8 (L) 8.9 - 10.3 mg/dL   Total Protein 5.9 (L) 6.5 - 8.1 g/dL   Albumin 2.6 (L) 3.5 - 5.0 g/dL   AST 27 15 - 41 U/L   ALT 34 0 - 44 U/L   Alkaline Phosphatase 130 (H) 38 - 126 U/L   Total Bilirubin 0.4 0.3 - 1.2 mg/dL   GFR, Estimated 38 (L) >60 mL/min   Anion gap 3 (L) 5 - 15   CT Head Wo Contrast  Result Date: 08/09/2021 CLINICAL DATA:  Altered mental status. EXAM: CT HEAD WITHOUT CONTRAST TECHNIQUE: Contiguous axial images were obtained from the base of the skull through the vertex without intravenous contrast. COMPARISON:  January 04, 2019 FINDINGS: Brain: There is moderate severity cerebral atrophy with widening of the extra-axial spaces and ventricular dilatation. There are areas of decreased attenuation within the white matter tracts of the supratentorial brain, consistent with microvascular disease changes. Vascular: No hyperdense vessel or unexpected calcification. Skull: Normal. Negative for fracture or focal lesion. Sinuses/Orbits: No acute finding. Other: None. IMPRESSION: 1. Generalized cerebral atrophy. 2. No acute intracranial abnormality. Electronically Signed   By: Virgina Norfolk M.D.   On: 08/09/2021 22:38    EKG None  Radiology CT Head Wo Contrast  Result Date: 08/09/2021 CLINICAL DATA:  Altered mental status. EXAM: CT HEAD WITHOUT CONTRAST TECHNIQUE: Contiguous axial images were obtained from the base of the skull through the vertex without intravenous contrast. COMPARISON:  January 04, 2019 FINDINGS: Brain: There is moderate  severity cerebral atrophy with widening of the extra-axial spaces and ventricular dilatation. There are areas of decreased attenuation within the white matter tracts of the supratentorial brain, consistent with microvascular disease changes. Vascular: No hyperdense vessel or unexpected calcification. Skull: Normal. Negative for fracture or focal lesion. Sinuses/Orbits: No acute finding. Other: None. IMPRESSION: 1. Generalized cerebral atrophy. 2. No acute intracranial abnormality. Electronically Signed   By: Virgina Norfolk M.D.   On: 08/09/2021 22:38    Procedures Procedures   Medications Ordered in ED Medications - No data to display  ED Course  I have reviewed the triage vital signs and the nursing notes.  Pertinent labs & imaging results that were available during my care of the patient were reviewed by me and considered in my medical decision making (see chart for details).    MDM Rules/Calculators/A&P                           Iv ns. Continuous pulse ox and cardiac monitoring. Stat labs and imaging.  Reviewed nursing notes and prior charts for additional history.   CT reviewed/interpreted by me - no hem.   Labs reviewed/interpreted by me -  wbc and hgb normal.  Ua pending.  2350 ua pending - signed out to Dr Christy Gentles to check UA - feel likely pt can return to ECF, if UA positive, treat.    Final Clinical Impression(s) / ED Diagnoses Final diagnoses:  None    Rx / DC Orders ED Discharge Orders     None        Lajean Saver, MD 08/09/21 2354

## 2021-08-09 NOTE — Discharge Instructions (Addendum)
It was our pleasure to provide your ER care today - we hope that you feel better.  Drink plenty of fluids/stay well hydrated. Minimize sedative meds/baclofen.   Follow up with primary care doctor in the coming week.  Return to ER if worse, new symptoms, fevers, new or severe pain, trouble breathing, or other concern.

## 2021-08-09 NOTE — ED Triage Notes (Signed)
Per facility pt has not been talking and very lethargic and hard to arouse since at least 1430. LKW unknown.

## 2021-08-10 ENCOUNTER — Inpatient Hospital Stay
Admission: RE | Admit: 2021-08-10 | Discharge: 2021-09-29 | Disposition: A | Discharge status: Other Acute Inpatient Hospital | Payer: Medicare PPO | Source: Ambulatory Visit | Attending: Internal Medicine | Admitting: Internal Medicine

## 2021-08-10 DIAGNOSIS — K5900 Constipation, unspecified: Principal | ICD-10-CM

## 2021-08-10 DIAGNOSIS — R4182 Altered mental status, unspecified: Secondary | ICD-10-CM | POA: Diagnosis not present

## 2021-08-10 LAB — CBG MONITORING, ED: Glucose-Capillary: 113 mg/dL — ABNORMAL HIGH (ref 70–99)

## 2021-08-10 LAB — URINALYSIS, ROUTINE W REFLEX MICROSCOPIC
Bilirubin Urine: NEGATIVE
Glucose, UA: NEGATIVE mg/dL
Ketones, ur: NEGATIVE mg/dL
Nitrite: NEGATIVE
Protein, ur: 100 mg/dL — AB
Specific Gravity, Urine: 1.017 (ref 1.005–1.030)
pH: 5 (ref 5.0–8.0)

## 2021-08-10 NOTE — ED Provider Notes (Signed)
I assumed care in signout to follow-up on labs.  No convincing signs Of infection.  CT head negative.  Will send urine culture.  Patient is in no acute distress.  I see no skin breakdown on his sacrum or buttocks.  Foley catheter is in place.  No wounds in his extremities.  Patient is awake and alert but has had a previous stroke.  Wife is at bedside.  I suspect patient may be oversedated from some his medications as she reports he is noted to have increased drowsiness recently Patient is on melatonin and baclofen. Patient is currently awake and alert and can recognize his wife and family from a photo. Will discharge back to facility   [name], MD 08/10/21 [phone]

## 2021-08-11 ENCOUNTER — Non-Acute Institutional Stay (SKILLED_NURSING_FACILITY): Payer: Medicare PPO | Admitting: Adult Health

## 2021-08-11 ENCOUNTER — Encounter: Payer: Self-pay | Admitting: Adult Health

## 2021-08-11 DIAGNOSIS — I610 Nontraumatic intracerebral hemorrhage in hemisphere, subcortical: Secondary | ICD-10-CM

## 2021-08-11 NOTE — Progress Notes (Signed)
Location:  [place] Room Number: 130 Place of Service:  SNF (31) Ok Edwards, NP   CODE STATUS: FULL CODE  No Known Allergies  Chief Complaint  Patient presents with   Acute Visit    Follow up from ED    HPI:  He was taken to the ED due to increased lethargy. His workup is negative for infection at this time. His urine culture is pending. The thought is that he is overly sedated; however; he has not had any recent medication changes. There are no reports of fevers present.   Past Medical History:  Diagnosis Date   A-fib [facility])    a. Post-op afib after CABG 08/2012.   Acute gastric ulcer with hemorrhage    Acute respiratory failure with hypoxia (HCC)    Aphasia following cerebral infarction    CAD (coronary artery disease)    a. NSTEMI s/p stent to distal RCA 03/2000. b. Inf MI s/p emergent thrombectomy/stenting mid RCA 10/2000. c. NSTEMI s/p CABGx4 (LIMA-LAD, SVG-OM2, seq SVG-acute marginal and PD) 06/17/40 - course complicated by confusion, post-op AF, L pleural effusion with thoracentesis. d. Normal LV function by echo 08/2012.   Diverticulosis    DM with CKD    Dysphagia following cerebral infarction    Encephalopathy    Extended spectrum beta lactamase (ESBL) resistance    Generalized anxiety disorder    GERD (gastroesophageal reflux disease)    Hemiplegia and hemiparesis following cerebral infarction affecting right dominant side (HCC)    Hiatal hernia    HLD (hyperlipidemia)    HTN (hypertension)    Non-ST elevation (NSTEMI) myocardial infarction [facility])    Nontraumatic intracerebral hemorrhage in hemisphere, subcortical [facility])    Peripheral vascular disease ([place])    a. Carotid dopplers neg 08/13/12. b. Pre-cabg ABIs - R=0.87 suggesting mild dz, L=1.29 possibly falsely elevated due to calcified vessels.   Pleural effusion    a. L pleural eff after CABG s/p thoracentesis 08/22/12.   Prostate cancer [facility])    Status post radiation treatment.    Prostatic hypertrophy    a. Hx of urinary retention, awaiting TURP.   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 Mild  MR by TEE 08/2012.    Past Surgical History:  Procedure Laterality Date   APPENDECTOMY     BACK SURGERY     CORONARY ARTERY BYPASS GRAFT  08/16/2012   Procedure: CORONARY ARTERY BYPASS GRAFTING (CABG);  Surgeon: Melrose Nakayama, MD;  Location: [place];  Service: Open Heart Surgery;  Laterality: N/A;   IR REPLC GASTRO/COLONIC TUBE PERCUT W/FLUORO  02/13/2019   LEFT HEART CATHETERIZATION WITH CORONARY ANGIOGRAM N/A 08/14/2012   Procedure: LEFT HEART CATHETERIZATION WITH CORONARY ANGIOGRAM;  Surgeon: Peter M Martinique, MD;  [facility];  Service: Cardiovascular;  Laterality: N/A;    Social History   Socioeconomic History   Marital status: Married    Spouse name: Not on file   Number of children: Not on file   Years of education: Not on file   Highest education level: Not on file  Occupational History   Occupation: retired   Tobacco Use   Smoking status: Former    Types: Cigarettes    Quit date: 11/12/1972    Years since quitting: 48.7   Smokeless tobacco: Never  Vaping Use   Vaping  Use: Never used  Substance and Sexual Activity   Alcohol use: No   Drug use: No   Sexual activity: Not Currently  Other Topics Concern   Not on file  Social History Narrative   He is a long term patient of [place]    Social Determinants of Health   Financial Resource Strain: Not on file  Food Insecurity: Not on file  Transportation Needs: Not on file  Physical Activity: Not on file  Stress: Not on file  Social Connections: Not on file  Intimate Partner Violence: Not on file   Family History  Problem Relation Age of Onset   Hypertension Mother       VITAL SIGNS BP 116/62   Pulse 67   Temp 98 F (36.7 C)   Resp 16   Ht 5' 11"  (1.803 m)    Wt 166 lb 9.6 oz (75.6 kg)   SpO2 100%   BMI 23.24 kg/m   Outpatient Encounter Medications as of 08/11/2021  Medication Sig   acetaminophen (TYLENOL) 325 MG tablet Take 650 mg by mouth every 4 (four) hours as needed. per standing order for pain/fever DO NOT EXCEED >3,000 mg in 24 hours   amLODipine (NORVASC) 5 MG tablet Take 5 mg by mouth daily.    apixaban (ELIQUIS) 5 MG TABS tablet Take 5 mg by mouth 2 (two) times daily.   atorvastatin (LIPITOR) 40 MG tablet Take 40 mg by mouth at bedtime.    baclofen (LIORESAL) 10 MG tablet Take 10 mg by mouth 3 (three) times daily.   Balsam [place]-Castor Oil (VENELEX) OINT Apply topically. Special Instructions: Apply to sacrum, coccyx and bilateral buttocks qshift for prevention/ redness. Every Shift Day, Evening, Night   Eyelid Cleansers (OCUSOFT LID SCRUB EX) daily. Cleanse both eye lids   Glucerna (GLUCERNA) LIQD Take 237 mLs by mouth daily.   hydrocortisone cream 1 % Apply rectally twice daily and as needed for hemorrhoids   insulin glargine (LANTUS) 100 UNIT/ML injection Inject 20 Units into the skin at bedtime.   insulin lispro (HUMALOG) 100 UNIT/ML cartridge Inject 10 Units into the skin 3 (three) times daily before meals. Give 10 units SQ with meals if Blood Sugar >150   lansoprazole (PREVACID SOLUTAB) 30 MG disintegrating tablet Take 30 mg by mouth daily. Give before meals   loperamide (IMODIUM A-D) 2 MG tablet Take 2 mg by mouth as needed for diarrhea or loose stools.   losartan (COZAAR) 50 MG tablet Take 50 mg by mouth daily.   Melatonin 1 MG TABS Take 2 tablets by mouth at bedtime.    NON FORMULARY Diet Change: Regular, thin liquids   oxybutynin (DITROPAN XL) 15 MG 24 hr tablet Take 15 mg by mouth daily.   polyethylene glycol (MIRALAX / GLYCOLAX) 17 g packet Take 17 g by mouth daily as needed.   rOPINIRole (REQUIP) 0.25 MG tablet Take 0.25 mg by mouth at bedtime.   No facility-administered encounter medications on file as of 08/11/2021.      SIGNIFICANT DIAGNOSTIC EXAMS   PREVIOUS   05-12-21: right lower extremity venous doppler: preliminary report: clot from groin to foot.   05-12-21: right lower extremity venous doppler (From ED):   1. Extensive acute mostly occlusive deep venous thrombosis extending from the right common femoral vein into the calf veins.  NO NEW EXAMS.    LABS REVIEWED PREVIOUS:   07-23-20: hgb a1c 6.2; chol 79; ldl 48; trig 53; hdl 20  10-29-20: glucose 103; bun 39; creat  1.64; k+ 4.0; na++ 140; ca 8.5  01-07-21: wbc 7.8; hgb 15.3; hct 47.7; mcv 93.7 plt 180; glucose 101; bun 45; creat 1.85; k+ 4.4; na++ 140; ca 8.6 GFR 37; liver normal albumin 2.8 chol 101; ldl 60; trig 77; hdl 26; hgb a1c 6.5; tsh 1.908 02-15-21: urine culture: multiple species  03-25-21 hgb a1c 6.6 05-11-21: urine for micro albumin 1299.7 (413.2) on ARB   05-12-21: wbc 10.6; hgb `15.2; hct 47.9; mcv 95.0 plt 181; glucose 233; bun 49; creat 1.98; k+ 4.4; na++ 137; ca 8.2; GFR34 05-14-21: glucose 116; bun 32; creat 1.68; k+ 4.1; na++ 139; ca 8.2; GFR 42 05-15-21: wbc 7.5; hgb 14.7; hct 44.6; mcv 89.7 plt 182  05-21-21: wbc 8.6; hgb 13.6; hct 42.9; mcv 93.5 plt 209; glucose 147; bun 35; creat 1.75; k+ 4.0 na++ 8.1; GFR 40 07-23-21: wbc 7.4; hgb 15.8; hct 50.3; mcv 94.7 plt 189; urine culture: enterococcus faecalis/providencia stuartii   TODAY  08-09-21: wbc 8.3; hgb 14.5; hct 45.3; mcv 93.8 plt 192; glucose 127; bun 34; creat 1.83; k+ 4.1; na++ 140; ca 7.8; GFR 38; alk phos 130; albumin 2.6    Review of Systems  Reason unable to perform ROS: expressive aphasia.   Physical Exam Constitutional:      General: He is not in acute distress.    Appearance: He is well-developed. He is not diaphoretic.  Neck:     Thyroid: No thyromegaly.  Cardiovascular:     Rate and Rhythm: Normal rate and regular rhythm.     Pulses: Normal pulses.     Heart sounds: Normal heart sounds.     Comments: Hx cabg  Pulmonary:     Effort: Pulmonary effort is  normal. No respiratory distress.     Breath sounds: Normal breath sounds.  Abdominal:     General: Bowel sounds are normal. There is no distension.     Palpations: Abdomen is soft.     Tenderness: There is no abdominal tenderness.  Genitourinary:    Comments: Foley penile tear  Musculoskeletal:     Cervical back: Neck supple.     Right lower leg: No edema.     Left lower leg: No edema.     Comments: Right hemiplegia   Lymphadenopathy:     Cervical: No cervical adenopathy.  Skin:    General: Skin is warm and dry.  Neurological:     Mental Status: He is alert. Mental status is at baseline.  Psychiatric:        Mood and Affect: Mood normal.     ASSESSMENT/ PLAN:  TODAY  Nontraumatic subcortical hemorrhage of left cerebral hemisphere  He is presently stable will not make changes; will await his urine culture and will treat as indicated.    Ok Edwards NP [facility]  Contact [phone] Monday through Friday 8am- 5pm  After hours call [phone]

## 2021-08-12 ENCOUNTER — Encounter: Payer: Self-pay | Admitting: Adult Health

## 2021-08-12 ENCOUNTER — Non-Acute Institutional Stay (SKILLED_NURSING_FACILITY): Payer: Medicare PPO | Admitting: Adult Health

## 2021-08-12 DIAGNOSIS — N390 Urinary tract infection, site not specified: Secondary | ICD-10-CM | POA: Diagnosis not present

## 2021-08-12 DIAGNOSIS — T83510S Infection and inflammatory reaction due to cystostomy catheter, sequela: Secondary | ICD-10-CM

## 2021-08-12 LAB — URINE CULTURE: Culture: >=100000 — AB

## 2021-08-12 NOTE — Progress Notes (Signed)
Location:  [place] Room Number: 482-N Place of Service:  SNF (31)   CODE STATUS: Full code  No Known Allergies  Chief Complaint  Patient presents with   Acute Visit    UTI    HPI:  His urine culture has grown pseudomonas aeruginosa. It is sensitive to cipro. There are no reports of fevers present. He does have some periods of fatigue. The culture was performed in the ED. This is the second UTI this past month.   Past Medical History:  Diagnosis Date   A-fib [facility])    a. Post-op afib after CABG 08/2012.   Acute gastric ulcer with hemorrhage    Acute respiratory failure with hypoxia (HCC)    Aphasia following cerebral infarction    CAD (coronary artery disease)    a. NSTEMI s/p stent to distal RCA 03/2000. b. Inf MI s/p emergent thrombectomy/stenting mid RCA 10/2000. c. NSTEMI s/p CABGx4 (LIMA-LAD, SVG-OM2, seq SVG-acute marginal and PD) 0/0/37 - course complicated by confusion, post-op AF, L pleural effusion with thoracentesis. d. Normal LV function by echo 08/2012.   Diverticulosis    DM with CKD    Dysphagia following cerebral infarction    Encephalopathy    Extended spectrum beta lactamase (ESBL) resistance    Generalized anxiety disorder    GERD (gastroesophageal reflux disease)    Hemiplegia and hemiparesis following cerebral infarction affecting right dominant side (HCC)    Hiatal hernia    HLD (hyperlipidemia)    HTN (hypertension)    Non-ST elevation (NSTEMI) myocardial infarction [facility])    Nontraumatic intracerebral hemorrhage in hemisphere, subcortical [facility])    Peripheral vascular disease ([place])    a. Carotid dopplers neg 08/13/12. b. Pre-cabg ABIs - R=0.87 suggesting mild dz, L=1.29 possibly falsely elevated due to calcified vessels.   Pleural effusion    a. L pleural eff after CABG s/p thoracentesis 08/22/12.   Prostate cancer [facility])    Status post radiation treatment.   Prostatic hypertrophy    a. Hx of urinary retention, awaiting TURP.    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 Mild  MR by TEE 08/2012.    Past Surgical History:  Procedure Laterality Date   APPENDECTOMY     BACK SURGERY     CORONARY ARTERY BYPASS GRAFT  08/16/2012   Procedure: CORONARY ARTERY BYPASS GRAFTING (CABG);  Surgeon: Melrose Nakayama, MD;  Location: [place];  Service: Open Heart Surgery;  Laterality: N/A;   IR REPLC GASTRO/COLONIC TUBE PERCUT W/FLUORO  02/13/2019   LEFT HEART CATHETERIZATION WITH CORONARY ANGIOGRAM N/A 08/14/2012   Procedure: LEFT HEART CATHETERIZATION WITH CORONARY ANGIOGRAM;  Surgeon: Peter M Martinique, MD;  Location: [facility] CATH LAB;  Service: Cardiovascular;  Laterality: N/A;    Social History   Socioeconomic History   Marital status: Married    Spouse name: Not on file   Number of children: Not on file   Years of education: Not on file   Highest education level: Not on file  Occupational History   Occupation: retired   Tobacco Use   Smoking status: Former    Types: Cigarettes    Quit date: 11/12/1972    Years since quitting: 48.7   Smokeless tobacco: Never  Vaping Use   Vaping Use: Never used  Substance and Sexual Activity   Alcohol use: No  Drug use: No   Sexual activity: Not Currently  Other Topics Concern   Not on file  Social History Narrative   He is a long term patient of [place] [facility]    Social Determinants of Health   Financial Resource Strain: Not on file  Food Insecurity: Not on file  Transportation Needs: Not on file  Physical Activity: Not on file  Stress: Not on file  Social Connections: Not on file  Intimate Partner Violence: Not on file   Family History  Problem Relation Age of Onset   Hypertension Mother       VITAL SIGNS BP 124/80   Pulse 76   Temp 98.1 F (36.7 C)   Resp 20   Ht _0  (1.803 m)   Wt 166 lb 9.6 oz (75.6 kg)   SpO2 94%   BMI 23.24 kg/m    Outpatient Encounter Medications as of 08/12/2021  Medication Sig   acetaminophen (TYLENOL) 325 MG tablet Take 650 mg by mouth every 4 (four) hours as needed. per standing order for pain/fever DO NOT EXCEED >3,000 mg in 24 hours   amLODipine (NORVASC) 5 MG tablet Take 5 mg by mouth daily.    apixaban (ELIQUIS) 5 MG TABS tablet Take 5 mg by mouth 2 (two) times daily.   atorvastatin (LIPITOR) 40 MG tablet Take 40 mg by mouth at bedtime.    baclofen (LIORESAL) 10 MG tablet Take 10 mg by mouth 3 (three) times daily.   Balsam [place]-Castor Oil (VENELEX) OINT Apply topically. Special Instructions: Apply to sacrum, coccyx and bilateral buttocks qshift for prevention/ redness. Every Shift Day, Evening, Night   ciprofloxacin (CIPRO) 500 MG tablet Take 500 mg by mouth 2 (two) times daily. pseudomonas aeruginosa (from ED) UTI   Eyelid Cleansers (OCUSOFT LID SCRUB EX) daily. Cleanse both eye lids   Glucerna (GLUCERNA) LIQD Take 237 mLs by mouth daily.   hydrocortisone cream 1 % Apply rectally twice daily and as needed for hemorrhoids   insulin glargine (LANTUS) 100 UNIT/ML injection Inject 20 Units into the skin at bedtime.   insulin lispro (HUMALOG) 100 UNIT/ML cartridge Inject 10 Units into the skin 3 (three) times daily before meals. Give 10 units SQ with meals if Blood Sugar >150   lansoprazole (PREVACID SOLUTAB) 30 MG disintegrating tablet Take 30 mg by mouth daily. Give before meals   loperamide (IMODIUM A-D) 2 MG tablet Take 2 mg by mouth as needed for diarrhea or loose stools.   losartan (COZAAR) 50 MG tablet Take 50 mg by mouth daily.   Melatonin 1 MG TABS Take 2 tablets by mouth at bedtime.    NON FORMULARY Diet Change: Regular, thin liquids   oxybutynin (DITROPAN XL) 15 MG 24 hr tablet Take 15 mg by mouth daily.   polyethylene glycol (MIRALAX / GLYCOLAX) 17 g packet Take 17 g by mouth daily as needed.   rOPINIRole (REQUIP) 0.25 MG tablet Take 0.25 mg by mouth at bedtime.   No  facility-administered encounter medications on file as of 08/12/2021.     SIGNIFICANT DIAGNOSTIC EXAMS  PREVIOUS   05-12-21: right lower extremity venous doppler: preliminary report: clot from groin to foot.   05-12-21: right lower extremity venous doppler (From ED):   1. Extensive acute mostly occlusive deep venous thrombosis extending from the right common femoral vein into the calf veins.  NO NEW EXAMS.    LABS REVIEWED PREVIOUS:   10-29-20: glucose 103; bun 39; creat 1.64; k+ 4.0; na++ 140; ca 8.5  01-07-21: wbc 7.8; hgb 15.3; hct 47.7; mcv 93.7 plt 180; glucose 101; bun 45; creat 1.85; k+ 4.4; na++ 140; ca 8.6 GFR 37; liver normal albumin 2.8 chol 101; ldl 60; trig 77; hdl 26; hgb a1c 6.5; tsh 1.908 02-15-21: urine culture: multiple species  03-25-21 hgb a1c 6.6 05-11-21: urine for micro albumin 1299.7 (413.2) on ARB   05-12-21: wbc 10.6; hgb `15.2; hct 47.9; mcv 95.0 plt 181; glucose 233; bun 49; creat 1.98; k+ 4.4; na++ 137; ca 8.2; GFR34 05-14-21: glucose 116; bun 32; creat 1.68; k+ 4.1; na++ 139; ca 8.2; GFR 42 05-15-21: wbc 7.5; hgb 14.7; hct 44.6; mcv 89.7 plt 182  05-21-21: wbc 8.6; hgb 13.6; hct 42.9; mcv 93.5 plt 209; glucose 147; bun 35; creat 1.75; k+ 4.0 na++ 8.1; GFR 40 07-23-21: wbc 7.4; hgb 15.8; hct 50.3; mcv 94.7 plt 189; urine culture: enterococcus faecalis/providencia stuartii   TODAY  08-09-21: wbc 8.3; hgb 14.5; hct 45.3; mcv 93.8 plt 192; glucose 127; bun 34; creat 1.83; k+ 4.1; na++ 140; ca 7.8; GFR 38; alk phos 130; albumin 2.6 urine culture: pseudomonas aeruginosa: cipro   Review of Systems  Reason unable to perform ROS: expressive aphasia.   Physical Exam Constitutional:      General: He is not in acute distress.    Appearance: He is well-developed. He is not diaphoretic.  Neck:     Thyroid: No thyromegaly.  Cardiovascular:     Rate and Rhythm: Normal rate and regular rhythm.     Pulses: Normal pulses.     Heart sounds: Normal heart sounds.  Pulmonary:      Effort: Pulmonary effort is normal. No respiratory distress.     Breath sounds: Normal breath sounds.  Abdominal:     General: Bowel sounds are normal. There is no distension.     Palpations: Abdomen is soft.     Tenderness: There is no abdominal tenderness.  Genitourinary:    Comments: Foley penile tear  Musculoskeletal:     Cervical back: Neck supple.     Right lower leg: No edema.     Left lower leg: No edema.     Comments: Right hemiplegia   Lymphadenopathy:     Cervical: No cervical adenopathy.  Skin:    General: Skin is warm and dry.  Neurological:     Mental Status: He is alert. Mental status is at baseline.  Psychiatric:        Mood and Affect: Mood normal.     ASSESSMENT/ PLAN:  TODAY  Urinary tract infection associated with cystostomy catheter sequela  Will begin cipro 500 mg twice daily through 08-19-21.     Ok Edwards NP [facility] Adult Medicine  Contact [phone] Monday through Friday 8am- 5pm  After hours call [phone]

## 2021-08-13 ENCOUNTER — Telehealth: Payer: Self-pay

## 2021-08-13 NOTE — Progress Notes (Signed)
ED Antimicrobial Stewardship Positive Culture Follow Up   DECKLIN POINTER is an 75 y.o. male who presented to [facility] on 08/10/2021 with a chief complaint of altered mental status    Recent Results (from the past 720 hour(s))  Urine Culture     Status: Abnormal   Collection Time: 07/23/21  4:45 PM   Specimen: Urine, Clean Catch  Result Value Ref Range Status   Specimen Description   Final    URINE, CLEAN CATCH Performed at [facility], [address] [place], [place] [address]    Special Requests   Final    NONE Performed at [facility], [address] [place], [place] [address]    Culture (A)  Final    >=100,000 COLONIES/mL ENTEROCOCCUS FAECALIS 20,000 COLONIES/mL PROVIDENCIA STUARTII    Report Status 07/26/2021 FINAL  Final   Organism ID, Bacteria ENTEROCOCCUS FAECALIS (A)  Final   Organism ID, Bacteria PROVIDENCIA STUARTII (A)  Final      Susceptibility   Enterococcus faecalis - MIC*    AMPICILLIN <=2 SENSITIVE Sensitive     NITROFURANTOIN <=16 SENSITIVE Sensitive     VANCOMYCIN 1 SENSITIVE Sensitive     * >=100,000 COLONIES/mL ENTEROCOCCUS FAECALIS   Providencia stuartii - MIC*    AMPICILLIN >=32 RESISTANT Resistant     CEFAZOLIN >=64 RESISTANT Resistant     CEFEPIME <=0.12 SENSITIVE Sensitive     CEFTRIAXONE <=0.25 SENSITIVE Sensitive     CIPROFLOXACIN 2 INTERMEDIATE Intermediate     GENTAMICIN RESISTANT Resistant     IMIPENEM 2 SENSITIVE Sensitive     NITROFURANTOIN 128 RESISTANT Resistant     TRIMETH/SULFA <=20 SENSITIVE Sensitive     AMPICILLIN/SULBACTAM >=32 RESISTANT Resistant     PIP/TAZO <=4 SENSITIVE Sensitive     * 20,000 COLONIES/mL PROVIDENCIA STUARTII  Urine Culture     Status: Abnormal   Collection Time: 08/10/21  1:53 AM   Specimen: Urine, Catheterized  Result Value Ref Range Status   Specimen Description   Final    URINE, CATHETERIZED Performed at [facility], [address], [place], [place] [address]    Special Requests   Final     NONE Performed at [facility], [address] [place],  [address]    Culture >=100,000 COLONIES/mL PSEUDOMONAS AERUGINOSA (A)  Final   Report Status 08/12/2021 FINAL  Final   Organism ID, Bacteria PSEUDOMONAS AERUGINOSA (A)  Final      Susceptibility   Pseudomonas aeruginosa - MIC*    CEFTAZIDIME 4 SENSITIVE Sensitive     CIPROFLOXACIN <=0.25 SENSITIVE Sensitive     GENTAMICIN <=1 SENSITIVE Sensitive     IMIPENEM 2 SENSITIVE Sensitive     PIP/TAZO 8 SENSITIVE Sensitive     CEFEPIME 2 SENSITIVE Sensitive     * >=100,000 COLONIES/mL PSEUDOMONAS AERUGINOSA    '[x]'$  Patient discharged originally without antimicrobial agent and treatment is now indicated  New antibiotic prescription: Ciprofloxacin 250 mg twice daily x 7 days   Patient placement to call nursing home to instruct patient's nurse to change foley catheter.   ED Provider: Octaviano Glow, MD    Albertina Parr, PharmD., BCPS, BCCCP Clinical Pharmacist Please refer to [facility] for unit-specific pharmacist

## 2021-08-13 NOTE — Telephone Encounter (Signed)
Post ED Visit - Positive Culture Follow-up: Successful Patient Follow-Up  Culture assessed and recommendations reviewed by:  '[]'$  Elenor Quinones, Pharm.D. '[]'$  Heide Guile, Pharm.D., BCPS AQ-ID '[]'$  Parks Neptune, Pharm.D., BCPS '[]'$  Alycia Rossetti, Pharm.D., BCPS '[]'$  [place], [place].D., BCPS, AAHIVP '[]'$  Legrand Como, Pharm.D., BCPS, AAHIVP '[]'$  Salome Arnt, PharmD, BCPS '[]'$  Johnnette Gourd, PharmD, BCPS '[]'$  Hughes Better, PharmD, BCPS '[]'$  Leeroy Cha, PharmD  Positive urine culture  '[x]'$  Patient discharged without antimicrobial prescription and treatment is now indicated '[]'$  Organism is resistant to prescribed ED discharge antimicrobial '[]'$  Patient with positive blood cultures  Changes discussed with ED provider: Lucilla Edin, MD  New antibiotic prescription Ciprofloxacin 250 mg po BID x 7 days. Instructions also given to change foley catheter soon.  Called and Faxed to [facility]  Contacted patient, date 08/13/2021, time 11:50 am   Glennon Hamilton 08/13/2021, 11:51 AM

## 2021-08-17 ENCOUNTER — Encounter: Payer: Self-pay | Admitting: Adult Health

## 2021-08-17 ENCOUNTER — Non-Acute Institutional Stay (SKILLED_NURSING_FACILITY): Payer: Medicare PPO | Admitting: Adult Health

## 2021-08-17 ENCOUNTER — Ambulatory Visit (HOSPITAL_COMMUNITY): Payer: Medicare PPO | Attending: Adult Health

## 2021-08-17 DIAGNOSIS — K5900 Constipation, unspecified: Secondary | ICD-10-CM | POA: Diagnosis not present

## 2021-08-17 DIAGNOSIS — R112 Nausea with vomiting, unspecified: Secondary | ICD-10-CM | POA: Diagnosis not present

## 2021-08-17 NOTE — Progress Notes (Signed)
Location:  [place] Room Number: 491-P Place of Service:  SNF (31)   CODE STATUS: Full Code   No Known Allergies  Chief Complaint  Patient presents with   Acute Visit    Nausea and vomiting     HPI:  Staff reports that over the past weekend he was having nausea and vomiting after meals. There are no reports of fevers; no reports of diarrhea. Today he has been able to tolerate his breakfast. There are no indications of pain present.   Past Medical History:  Diagnosis Date   A-fib [facility])    a. Post-op afib after CABG 08/2012.   Acute gastric ulcer with hemorrhage    Acute respiratory failure with hypoxia (HCC)    Aphasia following cerebral infarction    CAD (coronary artery disease)    a. NSTEMI s/p stent to distal RCA 03/2000. b. Inf MI s/p emergent thrombectomy/stenting mid RCA 10/2000. c. NSTEMI s/p CABGx4 (LIMA-LAD, SVG-OM2, seq SVG-acute marginal and PD) 08/12/49 - course complicated by confusion, post-op AF, L pleural effusion with thoracentesis. d. Normal LV function by echo 08/2012.   Diverticulosis    DM with CKD    Dysphagia following cerebral infarction    Encephalopathy    Extended spectrum beta lactamase (ESBL) resistance    Generalized anxiety disorder    GERD (gastroesophageal reflux disease)    Hemiplegia and hemiparesis following cerebral infarction affecting right dominant side (HCC)    Hiatal hernia    HLD (hyperlipidemia)    HTN (hypertension)    Non-ST elevation (NSTEMI) myocardial infarction [facility])    Nontraumatic intracerebral hemorrhage in hemisphere, subcortical [facility])    Peripheral vascular disease ([place])    a. Carotid dopplers neg 08/13/12. b. Pre-cabg ABIs - R=0.87 suggesting mild dz, L=1.29 possibly falsely elevated due to calcified vessels.   Pleural effusion    a. L pleural eff after CABG s/p thoracentesis 08/22/12.   Prostate cancer [facility])    Status post radiation treatment.   Prostatic hypertrophy    a. Hx of urinary  retention, awaiting TURP.   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 Mild  MR by TEE 08/2012.    Past Surgical History:  Procedure Laterality Date   APPENDECTOMY     BACK SURGERY     CORONARY ARTERY BYPASS GRAFT  08/16/2012   Procedure: CORONARY ARTERY BYPASS GRAFTING (CABG);  Surgeon: Melrose Nakayama, MD;  Location: [place];  Service: Open Heart Surgery;  Laterality: N/A;   IR REPLC GASTRO/COLONIC TUBE PERCUT W/FLUORO  02/13/2019   LEFT HEART CATHETERIZATION WITH CORONARY ANGIOGRAM N/A 08/14/2012   Procedure: LEFT HEART CATHETERIZATION WITH CORONARY ANGIOGRAM;  Surgeon: Peter M Martinique, MD;  Location: [facility] CATH LAB;  Service: Cardiovascular;  Laterality: N/A;    Social History   Socioeconomic History   Marital status: Married    Spouse name: Not on file   Number of children: Not on file   Years of education: Not on file   Highest education level: Not on file  Occupational History   Occupation: retired   Tobacco Use   Smoking status: Former    Types: Cigarettes    Quit date: 11/12/1972    Years since quitting: 48.7   Smokeless tobacco: Never  Vaping Use   Vaping Use: Never used  Substance and Sexual Activity  Alcohol use: No   Drug use: No   Sexual activity: Not Currently  Other Topics Concern   Not on file  Social History Narrative   He is a long term patient of [facility]    Social Determinants of Health   Financial Resource Strain: Not on file  Food Insecurity: Not on file  Transportation Needs: Not on file  Physical Activity: Not on file  Stress: Not on file  Social Connections: Not on file  Intimate Partner Violence: Not on file   Family History  Problem Relation Age of Onset   Hypertension Mother       VITAL SIGNS BP (!) 134/59   Pulse 67   Temp 98.1 F (36.7 C)   Resp 20   Ht $R'5\' 11"'QZ$  (1.803 m)   Wt 171 lb 12.8 oz (77.9 kg)   SpO2  94%   BMI 23.96 kg/m   Outpatient Encounter Medications as of 08/17/2021  Medication Sig   acetaminophen (TYLENOL) 325 MG tablet Take 650 mg by mouth every 4 (four) hours as needed. per standing order for pain/fever DO NOT EXCEED >3,000 mg in 24 hours   amLODipine (NORVASC) 5 MG tablet Take 5 mg by mouth daily.    apixaban (ELIQUIS) 5 MG TABS tablet Take 5 mg by mouth 2 (two) times daily.   atorvastatin (LIPITOR) 40 MG tablet Take 40 mg by mouth at bedtime.    baclofen (LIORESAL) 10 MG tablet Take 10 mg by mouth 3 (three) times daily.   Balsam [place]-Castor Oil (VENELEX) OINT Apply topically. Special Instructions: Apply to sacrum, coccyx and bilateral buttocks qshift for prevention/ redness. Every Shift Day, Evening, Night   ciprofloxacin (CIPRO) 500 MG tablet Take 500 mg by mouth 2 (two) times daily. pseudomonas aeruginosa (from ED) UTI   Eyelid Cleansers (OCUSOFT LID SCRUB EX) daily. Cleanse both eye lids   Glucerna (GLUCERNA) LIQD Take 237 mLs by mouth daily.   hydrocortisone cream 1 % Apply 1 application topically as needed (for hemorrhoids).   insulin glargine (LANTUS) 100 UNIT/ML injection Inject 20 Units into the skin at bedtime.   insulin lispro (HUMALOG) 100 UNIT/ML cartridge Inject 10 Units into the skin 3 (three) times daily before meals. Give 10 units SQ with meals if Blood Sugar >150   lansoprazole (PREVACID SOLUTAB) 30 MG disintegrating tablet Take 30 mg by mouth daily. Give before meals   loperamide (IMODIUM A-D) 2 MG tablet Take 2 mg by mouth as needed for diarrhea or loose stools.   losartan (COZAAR) 50 MG tablet Take 50 mg by mouth daily.   Melatonin 1 MG TABS Take 2 tablets by mouth at bedtime.    NON FORMULARY Diet Change: Regular, thin liquids   ondansetron (ZOFRAN-ODT) 4 MG disintegrating tablet Take 4 mg by mouth every 6 (six) hours as needed for nausea or vomiting.   oxybutynin (DITROPAN XL) 15 MG 24 hr tablet Take 15 mg by mouth daily.   polyethylene glycol (MIRALAX /  GLYCOLAX) 17 g packet Take 17 g by mouth daily as needed.   rOPINIRole (REQUIP) 0.25 MG tablet Take 0.25 mg by mouth at bedtime.   No facility-administered encounter medications on file as of 08/17/2021.     SIGNIFICANT DIAGNOSTIC EXAMS  PREVIOUS   05-12-21: right lower extremity venous doppler: preliminary report: clot from groin to foot.   05-12-21: right lower extremity venous doppler (From ED):   1. Extensive acute mostly occlusive deep venous thrombosis extending from the right common femoral vein into the  calf veins.  NO NEW EXAMS.    LABS REVIEWED PREVIOUS:   10-29-20: glucose 103; bun 39; creat 1.64; k+ 4.0; na++ 140; ca 8.5  01-07-21: wbc 7.8; hgb 15.3; hct 47.7; mcv 93.7 plt 180; glucose 101; bun 45; creat 1.85; k+ 4.4; na++ 140; ca 8.6 GFR 37; liver normal albumin 2.8 chol 101; ldl 60; trig 77; hdl 26; hgb a1c 6.5; tsh 1.908 02-15-21: urine culture: multiple species  03-25-21 hgb a1c 6.6 05-11-21: urine for micro albumin 1299.7 (413.2) on ARB   05-12-21: wbc 10.6; hgb `15.2; hct 47.9; mcv 95.0 plt 181; glucose 233; bun 49; creat 1.98; k+ 4.4; na++ 137; ca 8.2; GFR34 05-14-21: glucose 116; bun 32; creat 1.68; k+ 4.1; na++ 139; ca 8.2; GFR 42 05-15-21: wbc 7.5; hgb 14.7; hct 44.6; mcv 89.7 plt 182  05-21-21: wbc 8.6; hgb 13.6; hct 42.9; mcv 93.5 plt 209; glucose 147; bun 35; creat 1.75; k+ 4.0 na++ 8.1; GFR 40 07-23-21: wbc 7.4; hgb 15.8; hct 50.3; mcv 94.7 plt 189; urine culture: enterococcus faecalis/providencia stuartii  08-09-21: wbc 8.3; hgb 14.5; hct 45.3; mcv 93.8 plt 192; glucose 127; bun 34; creat 1.83; k+ 4.1; na++ 140; ca 7.8; GFR 38; alk phos 130; albumin 2.6 urine culture: pseudomonas aeruginosa: cipro  NO NEW LABS.   Review of Systems  Reason unable to perform ROS: expressive aphasia.    Physical Exam Constitutional:      General: He is not in acute distress.    Appearance: He is well-developed. He is not diaphoretic.  Neck:     Thyroid: No thyromegaly.   Cardiovascular:     Rate and Rhythm: Normal rate and regular rhythm.     Pulses: Normal pulses.     Heart sounds: Normal heart sounds.  Pulmonary:     Effort: Pulmonary effort is normal. No respiratory distress.     Breath sounds: Normal breath sounds.  Abdominal:     General: Bowel sounds are normal. There is no distension.     Palpations: Abdomen is soft.     Tenderness: There is no abdominal tenderness.  Genitourinary:    Comments: Foley; penile tear Musculoskeletal:        General: Normal range of motion.     Cervical back: Neck supple.     Right lower leg: No edema.     Left lower leg: No edema.     Comments: Right hemiplegia   Lymphadenopathy:     Cervical: No cervical adenopathy.  Skin:    General: Skin is warm and dry.  Neurological:     Mental Status: He is alert. Mental status is at baseline.  Psychiatric:        Mood and Affect: Mood normal.     ASSESSMENT/ PLAN:  TODAY  Nausea and vomiting: is without change: will continue to use zofran as ordered; will get a KUB and will treat as indicated. Will monitor his status.    Ok Edwards NP [facility] Adult Medicine  Contact [phone] Monday through Friday 8am- 5pm  After hours call [phone]

## 2021-08-18 DIAGNOSIS — T83511A Infection and inflammatory reaction due to indwelling urethral catheter, initial encounter: Secondary | ICD-10-CM | POA: Insufficient documentation

## 2021-08-18 DIAGNOSIS — N390 Urinary tract infection, site not specified: Secondary | ICD-10-CM | POA: Insufficient documentation

## 2021-09-03 ENCOUNTER — Non-Acute Institutional Stay (SKILLED_NURSING_FACILITY): Payer: Medicare PPO | Admitting: Adult Health

## 2021-09-03 ENCOUNTER — Encounter: Payer: Self-pay | Admitting: Adult Health

## 2021-09-03 DIAGNOSIS — F323 Major depressive disorder, single episode, severe with psychotic features: Secondary | ICD-10-CM

## 2021-09-03 DIAGNOSIS — I739 Peripheral vascular disease, unspecified: Secondary | ICD-10-CM | POA: Diagnosis not present

## 2021-09-03 DIAGNOSIS — G8111 Spastic hemiplegia affecting right dominant side: Secondary | ICD-10-CM | POA: Diagnosis not present

## 2021-09-03 DIAGNOSIS — I610 Nontraumatic intracerebral hemorrhage in hemisphere, subcortical: Secondary | ICD-10-CM | POA: Diagnosis not present

## 2021-09-03 DIAGNOSIS — I700 Atherosclerosis of aorta: Secondary | ICD-10-CM | POA: Diagnosis not present

## 2021-09-03 NOTE — Progress Notes (Signed)
Location:  [place] Room Number: 706-C Place of Service:  SNF (31)   CODE STATUS:dnr   No Known Allergies  Chief Complaint  Patient presents with   Acute Visit    Care plan meeting.     HPI:  We have come together for his care plan meeting. Family present. No BIMS; mood 1/30: decreased interest in activities. He is extensive to dependent with his adls. Is out of bed to geri-chair daily. He is able to feed himself most of the time. There have been no falls. He has a chronic foley and is incontinent of bowel. His cbg readings remain stable. Dietary: weight is 171 pounds regular diet on prostat for low albumin. Therapy none at this time. We have discussed his advanced directives to include CPR. She desires to change to him to DNR. He continues to be followed for his chronic illnesses including:  Aortic atherosclerosis Spastic right hemiplegia  Major depression with psychotic features  Peripheral vascular disease   Past Medical History:  Diagnosis Date   A-fib ([place])    a. Post-op afib after CABG 08/2012.   Acute gastric ulcer with hemorrhage    Acute respiratory failure with hypoxia (HCC)    Aphasia following cerebral infarction    CAD (coronary artery disease)    a. NSTEMI s/p stent to distal RCA 03/2000. b. Inf MI s/p emergent thrombectomy/stenting mid RCA 10/2000. c. NSTEMI s/p CABGx4 (LIMA-LAD, SVG-OM2, seq SVG-acute marginal and PD) 02/15/61 - course complicated by confusion, post-op AF, L pleural effusion with thoracentesis. d. Normal LV function by echo 08/2012.   Diverticulosis    DM with CKD    Dysphagia following cerebral infarction    Encephalopathy    Extended spectrum beta lactamase (ESBL) resistance    Generalized anxiety disorder    GERD (gastroesophageal reflux disease)    Hemiplegia and hemiparesis following cerebral infarction affecting right dominant side (HCC)    Hiatal hernia    HLD (hyperlipidemia)    HTN (hypertension)    Non-ST elevation  (NSTEMI) myocardial infarction [facility])    Nontraumatic intracerebral hemorrhage in hemisphere, subcortical [facility])    Peripheral vascular disease ([place])    a. Carotid dopplers neg 08/13/12. b. Pre-cabg ABIs - R=0.87 suggesting mild dz, L=1.29 possibly falsely elevated due to calcified vessels.   Pleural effusion    a. L pleural eff after CABG s/p thoracentesis 08/22/12.   Prostate cancer [facility])    Status post radiation treatment.   Prostatic hypertrophy    a. Hx of urinary retention, awaiting TURP.   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 Mild  MR by TEE 08/2012.    Past Surgical History:  Procedure Laterality Date   APPENDECTOMY     BACK SURGERY     CORONARY ARTERY BYPASS GRAFT  08/16/2012   Procedure: CORONARY ARTERY BYPASS GRAFTING (CABG);  Surgeon: Melrose Nakayama, MD;  Location: [place];  Service: Open Heart Surgery;  Laterality: N/A;   IR REPLC GASTRO/COLONIC TUBE PERCUT W/FLUORO  02/13/2019   LEFT HEART CATHETERIZATION WITH CORONARY ANGIOGRAM N/A 08/14/2012   Procedure: LEFT HEART CATHETERIZATION WITH CORONARY ANGIOGRAM;  Surgeon: Peter M Martinique, MD;  Location: [facility] CATH LAB;  Service: Cardiovascular;  Laterality: N/A;    Social History   Socioeconomic History   Marital status: Married    Spouse name:  Not on file   Number of children: Not on file   Years of education: Not on file   Highest education level: Not on file  Occupational History   Occupation: retired   Tobacco Use   Smoking status: Former    Types: Cigarettes    Quit date: 11/12/1972    Years since quitting: 48.8   Smokeless tobacco: Never  Vaping Use   Vaping Use: Never used  Substance and Sexual Activity   Alcohol use: No   Drug use: No   Sexual activity: Not Currently  Other Topics Concern   Not on file  Social History Narrative   He is a long term patient of [facility]    Social  Determinants of Health   Financial Resource Strain: Not on file  Food Insecurity: Not on file  Transportation Needs: Not on file  Physical Activity: Not on file  Stress: Not on file  Social Connections: Not on file  Intimate Partner Violence: Not on file   Family History  Problem Relation Age of Onset   Hypertension Mother       VITAL SIGNS BP 130/76   Pulse 65   Temp 98 F (36.7 C)   Resp 20   Ht _0  (1.803 m)   Wt 171 lb 12.8 oz (77.9 kg)   SpO2 93%   BMI 23.96 kg/m   Outpatient Encounter Medications as of 09/03/2021  Medication Sig   acetaminophen (TYLENOL) 325 MG tablet Take 650 mg by mouth every 4 (four) hours as needed. per standing order for pain/fever DO NOT EXCEED >3,000 mg in 24 hours   Amino Acids-Protein Hydrolys (FEEDING SUPPLEMENT, PRO-STAT SUGAR FREE 64,) LIQD Take 30 mLs by mouth 3 (three) times daily with meals. for albumin 2.6   amLODipine (NORVASC) 5 MG tablet Take 5 mg by mouth daily.    apixaban (ELIQUIS) 5 MG TABS tablet Take 5 mg by mouth 2 (two) times daily.   atorvastatin (LIPITOR) 40 MG tablet Take 40 mg by mouth at bedtime.    baclofen (LIORESAL) 10 MG tablet Take 10 mg by mouth 3 (three) times daily.   Balsam [place]-Castor Oil (VENELEX) OINT Apply topically. Special Instructions: Apply to sacrum, coccyx and bilateral buttocks qshift for prevention/ redness. Every Shift Day, Evening, Night   Eyelid Cleansers (OCUSOFT LID SCRUB EX) daily. Cleanse both eye lids   Glucerna (GLUCERNA) LIQD Take 237 mLs by mouth daily.   hydrocortisone cream 1 % Apply 1 application topically as needed (for hemorrhoids).   insulin glargine (LANTUS) 100 UNIT/ML injection Inject 20 Units into the skin at bedtime.   insulin lispro (HUMALOG) 100 UNIT/ML cartridge Inject 10 Units into the skin 3 (three) times daily before meals. Give 10 units SQ with meals if Blood Sugar >150   lansoprazole (PREVACID SOLUTAB) 30 MG disintegrating tablet Take 30 mg by mouth daily. Give  before meals   loperamide (IMODIUM A-D) 2 MG tablet Take 2 mg by mouth as needed for diarrhea or loose stools. May give another dose after each loose stool, up to 8 mg per 24 hours.   losartan (COZAAR) 50 MG tablet Take 50 mg by mouth daily.   Melatonin 1 MG TABS Take 2 tablets by mouth at bedtime.    NON FORMULARY Diet Change: Regular, thin liquids   ondansetron (ZOFRAN-ODT) 4 MG disintegrating tablet Take 4 mg by mouth every 6 (six) hours as needed for nausea or vomiting.   oxybutynin (DITROPAN XL) 15 MG 24 hr tablet Take  15 mg by mouth daily.   polyethylene glycol (MIRALAX / GLYCOLAX) 17 g packet Take 17 g by mouth daily as needed.   rOPINIRole (REQUIP) 0.25 MG tablet Take 0.25 mg by mouth at bedtime.   [DISCONTINUED] ciprofloxacin (CIPRO) 500 MG tablet Take 500 mg by mouth 2 (two) times daily. pseudomonas aeruginosa (from ED) UTI   No facility-administered encounter medications on file as of 09/03/2021.     SIGNIFICANT DIAGNOSTIC EXAMS  PREVIOUS   05-12-21: right lower extremity venous doppler: preliminary report: clot from groin to foot.   05-12-21: right lower extremity venous doppler (From ED):   1. Extensive acute mostly occlusive deep venous thrombosis extending from the right common femoral vein into the calf veins.  NO NEW EXAMS.    LABS REVIEWED PREVIOUS:   10-29-20: glucose 103; bun 39; creat 1.64; k+ 4.0; na++ 140; ca 8.5  01-07-21: wbc 7.8; hgb 15.3; hct 47.7; mcv 93.7 plt 180; glucose 101; bun 45; creat 1.85; k+ 4.4; na++ 140; ca 8.6 GFR 37; liver normal albumin 2.8 chol 101; ldl 60; trig 77; hdl 26; hgb a1c 6.5; tsh 1.908 02-15-21: urine culture: multiple species  03-25-21 hgb a1c 6.6 05-11-21: urine for micro albumin 1299.7 (413.2) on ARB   05-12-21: wbc 10.6; hgb `15.2; hct 47.9; mcv 95.0 plt 181; glucose 233; bun 49; creat 1.98; k+ 4.4; na++ 137; ca 8.2; GFR34 05-14-21: glucose 116; bun 32; creat 1.68; k+ 4.1; na++ 139; ca 8.2; GFR 42 05-15-21: wbc 7.5; hgb 14.7; hct 44.6; mcv  89.7 plt 182  05-21-21: wbc 8.6; hgb 13.6; hct 42.9; mcv 93.5 plt 209; glucose 147; bun 35; creat 1.75; k+ 4.0 na++ 8.1; GFR 40 07-23-21: wbc 7.4; hgb 15.8; hct 50.3; mcv 94.7 plt 189; urine culture: enterococcus faecalis/providencia stuartii  08-09-21: wbc 8.3; hgb 14.5; hct 45.3; mcv 93.8 plt 192; glucose 127; bun 34; creat 1.83; k+ 4.1; na++ 140; ca 7.8; GFR 38; alk phos 130; albumin 2.6 urine culture: pseudomonas aeruginosa: cipro  NO NEW LABS.   Review of Systems  Respiratory:  Negative for cough.   Cardiovascular:  Negative for chest pain.  Gastrointestinal:  Negative for abdominal pain.  Musculoskeletal:  Negative for myalgias.  Skin: Negative.   Neurological:  Negative for dizziness.  Psychiatric/Behavioral:  The patient is not nervous/anxious.    Physical Exam Constitutional:      General: He is not in acute distress.    Appearance: He is well-developed. He is not diaphoretic.  Neck:     Thyroid: No thyromegaly.  Cardiovascular:     Rate and Rhythm: Normal rate and regular rhythm.     Pulses: Normal pulses.     Heart sounds: Normal heart sounds.  Pulmonary:     Effort: Pulmonary effort is normal. No respiratory distress.     Breath sounds: Normal breath sounds.  Abdominal:     General: Bowel sounds are normal. There is no distension.     Palpations: Abdomen is soft.     Tenderness: There is no abdominal tenderness.  Genitourinary:    Comments: Foley penile tear Musculoskeletal:     Cervical back: Neck supple.     Right lower leg: No edema.     Left lower leg: No edema.     Comments: Right hemiplegia   Lymphadenopathy:     Cervical: No cervical adenopathy.  Skin:    General: Skin is warm and dry.  Neurological:     Mental Status: He is alert. Mental status is at baseline.  Psychiatric:        Mood and Affect: Mood normal.      ASSESSMENT/ PLAN:  TODAY  Aortic atherosclerosis Spastic right hemiplegia Major depression with psychotic features Peripheral  vascular disease    Will continue current medications Will change his status to DNR Will continue to monitor his status.   Time spent with patient: 45 minutes: 20 minutes spent with advanced directives and discussing code status. Weight; medications; care plan discusses.    Ok Edwards NP [facility]  Contact [phone] Monday through Friday 8am- 5pm  After hours call [phone]

## 2021-09-06 ENCOUNTER — Non-Acute Institutional Stay (SKILLED_NURSING_FACILITY): Payer: Medicare PPO | Admitting: Adult Health

## 2021-09-06 DIAGNOSIS — N183 Chronic kidney disease, stage 3 unspecified: Secondary | ICD-10-CM

## 2021-09-06 DIAGNOSIS — F333 Major depressive disorder, recurrent, severe with psychotic symptoms: Secondary | ICD-10-CM | POA: Diagnosis not present

## 2021-09-06 DIAGNOSIS — R809 Proteinuria, unspecified: Secondary | ICD-10-CM | POA: Diagnosis not present

## 2021-09-06 DIAGNOSIS — R339 Retention of urine, unspecified: Secondary | ICD-10-CM

## 2021-09-06 DIAGNOSIS — E1129 Type 2 diabetes mellitus with other diabetic kidney complication: Secondary | ICD-10-CM

## 2021-09-06 DIAGNOSIS — E1122 Type 2 diabetes mellitus with diabetic chronic kidney disease: Secondary | ICD-10-CM | POA: Diagnosis not present

## 2021-09-06 DIAGNOSIS — N3289 Other specified disorders of bladder: Secondary | ICD-10-CM | POA: Diagnosis not present

## 2021-09-06 DIAGNOSIS — F5105 Insomnia due to other mental disorder: Secondary | ICD-10-CM | POA: Diagnosis not present

## 2021-09-06 DIAGNOSIS — F323 Major depressive disorder, single episode, severe with psychotic features: Secondary | ICD-10-CM | POA: Diagnosis not present

## 2021-09-06 NOTE — Progress Notes (Signed)
Location:  [place] Room Number: 559-R Place of Service:  SNF (31)   CODE STATUS: DNR  No Known Allergies  Chief Complaint  Patient presents with   Medical Management of Chronic Issues           Micro-albuminuria due to type 2 diabetes mellitus:  Major depression with psychotic features:  CKD stage 3 due to type 2 diabetes mellitus:  Chronic urine retention/bladder spasms     HPI:  [name] is a 75 year old long term resident of this facility being seen for the management of his chronic illnesses: Micro-albuminuria due to type 2 diabetes mellitus:  Major depression with psychotic features:  CKD stage 3 due to type 2 diabetes mellitus:  Chronic urine retention/bladder spasms. There are no reports of uncontrolled pain. Since lowering his baclofen there have been no further episodes of altered mental status.   Past Medical History:  Diagnosis Date   A-fib [facility])    a. Post-op afib after CABG 08/2012.   Acute gastric ulcer with hemorrhage    Acute respiratory failure with hypoxia (HCC)    Aphasia following cerebral infarction    CAD (coronary artery disease)    a. NSTEMI s/p stent to distal RCA 03/2000. b. Inf MI s/p emergent thrombectomy/stenting mid RCA 10/2000. c. NSTEMI s/p CABGx4 (LIMA-LAD, SVG-OM2, seq SVG-acute marginal and PD) 03/12/62 - course complicated by confusion, post-op AF, L pleural effusion with thoracentesis. d. Normal LV function by echo 08/2012.   Diverticulosis    DM with CKD    Dysphagia following cerebral infarction    Encephalopathy    Extended spectrum beta lactamase (ESBL) resistance    Generalized anxiety disorder    GERD (gastroesophageal reflux disease)    Hemiplegia and hemiparesis following cerebral infarction affecting right dominant side (HCC)    Hiatal hernia    HLD (hyperlipidemia)    HTN (hypertension)    Non-ST elevation (NSTEMI) myocardial infarction [facility])    Nontraumatic intracerebral hemorrhage in hemisphere, subcortical  [facility])    Peripheral vascular disease ([place])    a. Carotid dopplers neg 08/13/12. b. Pre-cabg ABIs - R=0.87 suggesting mild dz, L=1.29 possibly falsely elevated due to calcified vessels.   Pleural effusion    a. L pleural eff after CABG s/p thoracentesis 08/22/12.   Prostate cancer [facility])    Status post radiation treatment.   Prostatic hypertrophy    a. Hx of urinary retention, awaiting TURP.   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 Mild  MR by TEE 08/2012.    Past Surgical History:  Procedure Laterality Date   APPENDECTOMY     BACK SURGERY     CORONARY ARTERY BYPASS GRAFT  08/16/2012   Procedure: CORONARY ARTERY BYPASS GRAFTING (CABG);  Surgeon: Melrose Nakayama, MD;  Location: [place];  Service: Open Heart Surgery;  Laterality: N/A;   IR REPLC GASTRO/COLONIC TUBE PERCUT W/FLUORO  02/13/2019   LEFT HEART CATHETERIZATION WITH CORONARY ANGIOGRAM N/A 08/14/2012   Procedure: LEFT HEART CATHETERIZATION WITH CORONARY ANGIOGRAM;  Surgeon: Peter M Martinique, MD;  Location: [facility] CATH LAB;  Service: Cardiovascular;  Laterality: N/A;    Social History   Socioeconomic History   Marital status: Married    Spouse name: Not on file   Number of children: Not on file   Years of education: Not on file  Highest education level: Not on file  Occupational History   Occupation: retired   Tobacco Use   Smoking status: Former    Types: Cigarettes    Quit date: 11/12/1972    Years since quitting: 48.8   Smokeless tobacco: Never  Vaping Use   Vaping Use: Never used  Substance and Sexual Activity   Alcohol use: No   Drug use: No   Sexual activity: Not Currently  Other Topics Concern   Not on file  Social History Narrative   [name] is a long term patient of [facility]    Social Determinants of Health   Financial Resource Strain: Not on file  Food Insecurity: Not on file  Transportation  Needs: Not on file  Physical Activity: Not on file  Stress: Not on file  Social Connections: Not on file  Intimate Partner Violence: Not on file   Family History  Problem Relation Age of Onset   Hypertension Mother       VITAL SIGNS BP 135/82   Pulse 79   Temp 98 F (36.7 C)   Resp 20   Ht 5' 11"  (1.803 m)   Wt 171 lb 12.8 oz (77.9 kg)   SpO2 93%   BMI 23.96 kg/m   Outpatient Encounter Medications as of 09/06/2021  Medication Sig   acetaminophen (TYLENOL) 325 MG tablet Take 650 mg by mouth every 4 (four) hours as needed. per standing order for pain/fever DO NOT EXCEED >3,000 mg in 24 hours   Amino Acids-Protein Hydrolys (FEEDING SUPPLEMENT, PRO-STAT SUGAR FREE 64,) LIQD Take 30 mLs by mouth 3 (three) times daily with meals. for albumin 2.6   amLODipine (NORVASC) 5 MG tablet Take 5 mg by mouth daily.    apixaban (ELIQUIS) 5 MG TABS tablet Take 5 mg by mouth 2 (two) times daily.   atorvastatin (LIPITOR) 40 MG tablet Take 40 mg by mouth at bedtime.    baclofen (LIORESAL) 10 MG tablet Take 5 mg by mouth 3 (three) times daily.   Balsam [place]-Castor Oil (VENELEX) OINT Apply topically. Special Instructions: Apply to sacrum, coccyx and bilateral buttocks qshift for prevention/ redness. Every Shift Day, Evening, Night   Eyelid Cleansers (OCUSOFT LID SCRUB EX) daily. Cleanse both eye lids   Glucerna (GLUCERNA) LIQD Take 237 mLs by mouth daily.   hydrocortisone cream 1 % Apply 1 application topically as needed (for hemorrhoids).   insulin glargine (LANTUS) 100 UNIT/ML injection Inject 20 Units into the skin at bedtime.   insulin lispro (HUMALOG) 100 UNIT/ML cartridge Inject 10 Units into the skin 3 (three) times daily before meals. Give 10 units SQ with meals if Blood Sugar >150   lansoprazole (PREVACID SOLUTAB) 30 MG disintegrating tablet Take 30 mg by mouth daily. Give before meals   loperamide (IMODIUM A-D) 2 MG tablet Take 2 mg by mouth as needed for diarrhea or loose stools. May  give another dose after each loose stool, up to 8 mg per 24 hours.   losartan (COZAAR) 50 MG tablet Take 50 mg by mouth daily.   Melatonin 1 MG TABS Take 2 tablets by mouth at bedtime.    NON FORMULARY Diet Change: Regular, thin liquids   ondansetron (ZOFRAN-ODT) 4 MG disintegrating tablet Take 4 mg by mouth every 6 (six) hours as needed for nausea or vomiting.   oxybutynin (DITROPAN XL) 15 MG 24 hr tablet Take 15 mg by mouth daily.   polyethylene glycol (MIRALAX / GLYCOLAX) 17 g packet Take 17 g by mouth daily  as needed.   rOPINIRole (REQUIP) 0.25 MG tablet Take 0.25 mg by mouth at bedtime.   No facility-administered encounter medications on file as of 09/06/2021.     SIGNIFICANT DIAGNOSTIC EXAMS  PREVIOUS   05-12-21: right lower extremity venous doppler: preliminary report: clot from groin to foot.   05-12-21: right lower extremity venous doppler (From ED):   1. Extensive acute mostly occlusive deep venous thrombosis extending from the right common femoral vein into the calf veins.  NO NEW EXAMS.    LABS REVIEWED PREVIOUS:   10-29-20: glucose 103; bun 39; creat 1.64; k+ 4.0; na++ 140; ca 8.5  01-07-21: wbc 7.8; hgb 15.3; hct 47.7; mcv 93.7 plt 180; glucose 101; bun 45; creat 1.85; k+ 4.4; na++ 140; ca 8.6 GFR 37; liver normal albumin 2.8 chol 101; ldl 60; trig 77; hdl 26; hgb a1c 6.5; tsh 1.908 02-15-21: urine culture: multiple species  03-25-21 hgb a1c 6.6 05-11-21: urine for micro albumin 1299.7 (413.2) on ARB   05-12-21: wbc 10.6; hgb `15.2; hct 47.9; mcv 95.0 plt 181; glucose 233; bun 49; creat 1.98; k+ 4.4; na++ 137; ca 8.2; GFR34 05-14-21: glucose 116; bun 32; creat 1.68; k+ 4.1; na++ 139; ca 8.2; GFR 42 05-15-21: wbc 7.5; hgb 14.7; hct 44.6; mcv 89.7 plt 182  05-21-21: wbc 8.6; hgb 13.6; hct 42.9; mcv 93.5 plt 209; glucose 147; bun 35; creat 1.75; k+ 4.0 na++ 8.1; GFR 40 07-23-21: wbc 7.4; hgb 15.8; hct 50.3; mcv 94.7 plt 189; urine culture: enterococcus faecalis/providencia stuartii   08-09-21: wbc 8.3; hgb 14.5; hct 45.3; mcv 93.8 plt 192; glucose 127; bun 34; creat 1.83; k+ 4.1; na++ 140; ca 7.8; GFR 38; alk phos 130; albumin 2.6 urine culture: pseudomonas aeruginosa: cipro  NO NEW LABS.   Review of Systems  Constitutional:  Negative for malaise/fatigue.  Respiratory:  Negative for cough.   Cardiovascular:  Negative for chest pain and leg swelling.  Gastrointestinal:  Negative for heartburn.  Musculoskeletal:  Negative for back pain, joint pain and myalgias.  Skin: Negative.   Neurological:  Negative for weakness.  Psychiatric/Behavioral:  The patient is not nervous/anxious.     Physical Exam Constitutional:      General: [name] is not in acute distress.    Appearance: [name] is well-developed. [name] is not diaphoretic.  Neck:     Thyroid: No thyromegaly.  Cardiovascular:     Rate and Rhythm: Normal rate and regular rhythm.     Pulses: Normal pulses.     Heart sounds: Normal heart sounds.  Pulmonary:     Effort: Pulmonary effort is normal. No respiratory distress.     Breath sounds: Normal breath sounds.  Abdominal:     General: Bowel sounds are normal. There is no distension.     Palpations: Abdomen is soft.     Tenderness: There is no abdominal tenderness.  Genitourinary:    Comments: Foley penile tear  Musculoskeletal:     Cervical back: Neck supple.     Right lower leg: No edema.     Left lower leg: No edema.     Comments: Right hemiplegia  Lymphadenopathy:     Cervical: No cervical adenopathy.  Skin:    General: Skin is warm and dry.  Neurological:     Mental Status: [name] is alert. Mental status is at baseline.  Psychiatric:        Mood and Affect: Mood normal.     ASSESSMENT/ PLAN:  TODAY:   Micro-albuminuria due to type 2  diabetes mellitus: is without change 1299.7 is on ARB  2. Major depression with psychotic features: is stable is presently off all medications will monitor   3. CKD stage 3 due to type 2 diabetes mellitus: is stable bun 35;  creat 1.72; GFR 40   4. Chronic urine retention/bladder spasms: is stable has long term foley: will continue ditropan xl 15 mg daily   PREVIOUS   5. Type 2 diabetes mellitus with peripheral vascular disease: is stable hgb a1c 6.7 will continue lantus 20 units nightly and humalog 10 units with meals  Is on statin arb; eliquis   6. Protein calorie malnutrition severe: is stable weight is 165 pounds albumin 2.2 will continue supplements as indicated  7. Nontraumatic subcortical hemorrhage or left cerebral hemisphere/right spastic hemiplegia: is stable will continue baclofen 10 mg twice daily for spasticity   8. Aortic atherosclerosis (ct 01-04-19) will monitor   9. Acute deep vein thrombosis (DVT) of femoral vein of right leg (05-12-21) is stable will continue eliquis 5 mg twice daily   10. CAD native heart without angina: is status post cabg 2012: will continue cozaar 50 mg daily   11. Hypertension associated with type 2 diabetes mellitus: is stable b/p 135/82 will continue norvasc 5 mg daily cozaar 50 mg daily   12 Dyslipidemia associated with type 2 diabetes mellitus: is stable LDL 77 will continue lipitor 40 mg daily     Ok Edwards NP [facility]  Contact [phone] Monday through Friday 8am- 5pm  After hours call [phone]

## 2021-09-09 ENCOUNTER — Encounter: Payer: Self-pay | Admitting: Adult Health

## 2021-09-09 ENCOUNTER — Encounter (HOSPITAL_COMMUNITY)
Admission: RE | Admit: 2021-09-09 | Discharge: 2021-09-09 | Disposition: A | Discharge status: Home / Self Care | Payer: Medicare PPO | Source: Skilled Nursing Facility | Attending: Adult Health | Admitting: Adult Health

## 2021-09-09 DIAGNOSIS — N400 Enlarged prostate without lower urinary tract symptoms: Secondary | ICD-10-CM | POA: Diagnosis not present

## 2021-09-09 DIAGNOSIS — E1165 Type 2 diabetes mellitus with hyperglycemia: Secondary | ICD-10-CM | POA: Insufficient documentation

## 2021-09-09 LAB — PSA: Prostatic Specific Antigen: 0.37 ng/mL (ref 0.00–4.00)

## 2021-09-09 LAB — LIPID PANEL
Cholesterol: 95 mg/dL (ref 0–200)
HDL: 22 mg/dL — ABNORMAL LOW (ref >40)
LDL Cholesterol: 58 mg/dL (ref 0–99)
Total CHOL/HDL Ratio: 4.3 RATIO
Triglycerides: 75 mg/dL (ref <150)
VLDL: 15 mg/dL (ref 0–40)

## 2021-09-10 LAB — HEMOGLOBIN A1C
Hgb A1c MFr Bld: 6.5 % — ABNORMAL HIGH (ref 4.8–5.6)
Mean Plasma Glucose: 140 mg/dL

## 2021-09-13 ENCOUNTER — Non-Acute Institutional Stay (SKILLED_NURSING_FACILITY): Payer: Medicare PPO | Admitting: Adult Health

## 2021-09-13 ENCOUNTER — Encounter: Payer: Self-pay | Admitting: Adult Health

## 2021-09-13 ENCOUNTER — Other Ambulatory Visit (HOSPITAL_COMMUNITY)
Admission: RE | Admit: 2021-09-13 | Discharge: 2021-09-13 | Disposition: A | Discharge status: Home / Self Care | Payer: Medicare PPO | Source: Skilled Nursing Facility | Attending: Adult Health | Admitting: Adult Health

## 2021-09-13 DIAGNOSIS — Z136 Encounter for screening for cardiovascular disorders: Secondary | ICD-10-CM | POA: Diagnosis not present

## 2021-09-13 DIAGNOSIS — U071 COVID-19: Secondary | ICD-10-CM | POA: Diagnosis not present

## 2021-09-13 LAB — CBC
HCT: 48.5 % (ref 39.0–52.0)
Hemoglobin: 15.3 g/dL (ref 13.0–17.0)
MCH: 29.7 pg (ref 26.0–34.0)
MCHC: 31.5 g/dL (ref 30.0–36.0)
MCV: 94 fL (ref 80.0–100.0)
Platelets: 180 10*3/uL (ref 150–400)
RBC: 5.16 MIL/uL (ref 4.22–5.81)
RDW: 13.2 % (ref 11.5–15.5)
WBC: 7.4 10*3/uL (ref 4.0–10.5)
nRBC: 0 % (ref 0.0–0.2)

## 2021-09-13 LAB — D-DIMER, QUANTITATIVE: D-Dimer, Quant: <0.27 ug/mL-FEU (ref 0.00–0.50)

## 2021-09-13 LAB — BASIC METABOLIC PANEL
Anion gap: 4 — ABNORMAL LOW (ref 5–15)
BUN: 43 mg/dL — ABNORMAL HIGH (ref 8–23)
CO2: 28 mmol/L (ref 22–32)
Calcium: 8.3 mg/dL — ABNORMAL LOW (ref 8.9–10.3)
Chloride: 107 mmol/L (ref 98–111)
Creatinine, Ser: 2.11 mg/dL — ABNORMAL HIGH (ref 0.61–1.24)
GFR, Estimated: 32 mL/min — ABNORMAL LOW (ref >60)
Glucose, Bld: 247 mg/dL — ABNORMAL HIGH (ref 70–99)
Potassium: 4.7 mmol/L (ref 3.5–5.1)
Sodium: 139 mmol/L (ref 135–145)

## 2021-09-13 LAB — SARS CORONAVIRUS 2 BY RT PCR (HOSPITAL ORDER, PERFORMED IN ~~LOC~~ HOSPITAL LAB): SARS Coronavirus 2: NEGATIVE

## 2021-09-13 NOTE — Progress Notes (Signed)
Location:  [place] Room Number: 130-P Place of Service:  SNF (31)   CODE STATUS: DNR  No Known Allergies  Chief Complaint  Patient presents with   Acute Visit    COVID 19    HPI:  He has tested positive with 2 rapid tests today. He is without any symptoms there are no reports of fever present. He is not coughing; no excessive fatigue. PCR results pending.   Past Medical History:  Diagnosis Date   A-fib [facility])    a. Post-op afib after CABG 08/2012.   Acute gastric ulcer with hemorrhage    Acute respiratory failure with hypoxia (HCC)    Aphasia following cerebral infarction    CAD (coronary artery disease)    a. NSTEMI s/p stent to distal RCA 03/2000. b. Inf MI s/p emergent thrombectomy/stenting mid RCA 10/2000. c. NSTEMI s/p CABGx4 (LIMA-LAD, SVG-OM2, seq SVG-acute marginal and PD) 01/14/54 - course complicated by confusion, post-op AF, L pleural effusion with thoracentesis. d. Normal LV function by echo 08/2012.   Diverticulosis    DM with CKD    Dysphagia following cerebral infarction    Encephalopathy    Extended spectrum beta lactamase (ESBL) resistance    Generalized anxiety disorder    GERD (gastroesophageal reflux disease)    Hemiplegia and hemiparesis following cerebral infarction affecting right dominant side (HCC)    Hiatal hernia    HLD (hyperlipidemia)    HTN (hypertension)    Non-ST elevation (NSTEMI) myocardial infarction [facility])    Nontraumatic intracerebral hemorrhage in hemisphere, subcortical [facility])    Peripheral vascular disease ([place])    a. Carotid dopplers neg 08/13/12. b. Pre-cabg ABIs - R=0.87 suggesting mild dz, L=1.29 possibly falsely elevated due to calcified vessels.   Pleural effusion    a. L pleural eff after CABG s/p thoracentesis 08/22/12.   Prostate cancer [facility])    Status post radiation treatment.   Prostatic hypertrophy    a. Hx of urinary retention, awaiting TURP.   Severe protein-calorie malnutrition ([place])    Stroke  (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 Mild  MR by TEE 08/2012.    Past Surgical History:  Procedure Laterality Date   APPENDECTOMY     BACK SURGERY     CORONARY ARTERY BYPASS GRAFT  08/16/2012   Procedure: CORONARY ARTERY BYPASS GRAFTING (CABG);  Surgeon: Melrose Nakayama, MD;  Location: [place];  Service: Open Heart Surgery;  Laterality: N/A;   IR REPLC GASTRO/COLONIC TUBE PERCUT W/FLUORO  02/13/2019   LEFT HEART CATHETERIZATION WITH CORONARY ANGIOGRAM N/A 08/14/2012   Procedure: LEFT HEART CATHETERIZATION WITH CORONARY ANGIOGRAM;  Surgeon: Peter M Martinique, MD;  Location: [facility] CATH LAB;  Service: Cardiovascular;  Laterality: N/A;    Social History   Socioeconomic History   Marital status: Married    Spouse name: Not on file   Number of children: Not on file   Years of education: Not on file   Highest education level: Not on file  Occupational History   Occupation: retired   Tobacco Use   Smoking status: Former    Types: Cigarettes    Quit date: 11/12/1972    Years since quitting: 48.8   Smokeless tobacco: Never  Vaping Use   Vaping Use: Never used  Substance and Sexual Activity   Alcohol use: No   Drug use: No   Sexual activity: Not  Currently  Other Topics Concern   Not on file  Social History Narrative   He is a long term patient of [facility]    Social Determinants of Health   Financial Resource Strain: Not on file  Food Insecurity: Not on file  Transportation Needs: Not on file  Physical Activity: Not on file  Stress: Not on file  Social Connections: Not on file  Intimate Partner Violence: Not on file   Family History  Problem Relation Age of Onset   Hypertension Mother       VITAL SIGNS BP 112/60   Pulse 76   Temp 98 F (36.7 C)   Resp 20   Ht 5' 11"  (1.803 m)   Wt 171 lb 12.8 oz (77.9 kg)   SpO2 93%   BMI 23.96 kg/m   Outpatient Encounter Medications as of 09/13/2021  Medication  Sig   acetaminophen (TYLENOL) 325 MG tablet Take 650 mg by mouth every 4 (four) hours as needed. per standing order for pain/fever DO NOT EXCEED >3,000 mg in 24 hours   Amino Acids-Protein Hydrolys (FEEDING SUPPLEMENT, PRO-STAT SUGAR FREE 64,) LIQD Take 30 mLs by mouth 3 (three) times daily with meals. for albumin 2.6   amLODipine (NORVASC) 5 MG tablet Take 5 mg by mouth daily.    apixaban (ELIQUIS) 5 MG TABS tablet Take 5 mg by mouth 2 (two) times daily.   atorvastatin (LIPITOR) 40 MG tablet Take 40 mg by mouth at bedtime.    Baclofen 5 MG TABS Take 5 mg by mouth 3 (three) times daily. Nontraumatic intracerebral hemorrhage in hemisphere, subcortical   Balsam [place]-Castor Oil (VENELEX) OINT Apply topically. Special Instructions: Apply to sacrum, coccyx and bilateral buttocks qshift for prevention/ redness. Every Shift Day, Evening, Night   Eyelid Cleansers (OCUSOFT LID SCRUB EX) daily. Cleanse both eye lids   Glucerna (GLUCERNA) LIQD Take 237 mLs by mouth daily.   hydrocortisone cream 1 % Apply 1 application topically as needed (for hemorrhoids).   insulin glargine (LANTUS) 100 UNIT/ML injection Inject 20 Units into the skin at bedtime.   insulin lispro (HUMALOG) 100 UNIT/ML cartridge Inject 10 Units into the skin 3 (three) times daily before meals. Give 10 units SQ with meals if Blood Sugar >150   lansoprazole (PREVACID SOLUTAB) 30 MG disintegrating tablet Take 30 mg by mouth daily. Give before meals   loperamide (IMODIUM A-D) 2 MG tablet Take 2 mg by mouth as needed for diarrhea or loose stools. May give another dose after each loose stool, up to 8 mg per 24 hours.   losartan (COZAAR) 50 MG tablet Take 50 mg by mouth daily.   Melatonin 1 MG TABS Take 2 tablets by mouth at bedtime.    molnupiravir EUA (LAGEVRIO) 200 MG CAPS capsule Take 4 capsules by mouth every 12 (twelve) hours. x 5 days - Covid-19/ received wifes permission   NON FORMULARY Diet Change: Regular, thin liquids   ondansetron  (ZOFRAN-ODT) 4 MG disintegrating tablet Take 4 mg by mouth every 6 (six) hours as needed for nausea or vomiting.   oxybutynin (DITROPAN XL) 15 MG 24 hr tablet Take 15 mg by mouth daily.   polyethylene glycol (MIRALAX / GLYCOLAX) 17 g packet Take 17 g by mouth daily as needed.   rOPINIRole (REQUIP) 0.25 MG tablet Take 0.25 mg by mouth at bedtime.   vitamin C (ASCORBIC ACID) 500 MG tablet Take 500 mg by mouth 2 (two) times daily.   Zinc Sulfate (ZINC-220 PO) Take by mouth.   [  DISCONTINUED] baclofen (LIORESAL) 10 MG tablet Take 10 mg by mouth 3 (three) times daily.   No facility-administered encounter medications on file as of 09/13/2021.     SIGNIFICANT DIAGNOSTIC EXAMS   PREVIOUS   05-12-21: right lower extremity venous doppler: preliminary report: clot from groin to foot.   05-12-21: right lower extremity venous doppler (From ED):   1. Extensive acute mostly occlusive deep venous thrombosis extending from the right common femoral vein into the calf veins.  NO NEW EXAMS.    LABS REVIEWED PREVIOUS:   10-29-20: glucose 103; bun 39; creat 1.64; k+ 4.0; na++ 140; ca 8.5  01-07-21: wbc 7.8; hgb 15.3; hct 47.7; mcv 93.7 plt 180; glucose 101; bun 45; creat 1.85; k+ 4.4; na++ 140; ca 8.6 GFR 37; liver normal albumin 2.8 chol 101; ldl 60; trig 77; hdl 26; hgb a1c 6.5; tsh 1.908 02-15-21: urine culture: multiple species  03-25-21 hgb a1c 6.6 05-11-21: urine for micro albumin 1299.7 (413.2) on ARB   05-12-21: wbc 10.6; hgb `15.2; hct 47.9; mcv 95.0 plt 181; glucose 233; bun 49; creat 1.98; k+ 4.4; na++ 137; ca 8.2; GFR34 05-14-21: glucose 116; bun 32; creat 1.68; k+ 4.1; na++ 139; ca 8.2; GFR 42 05-15-21: wbc 7.5; hgb 14.7; hct 44.6; mcv 89.7 plt 182  05-21-21: wbc 8.6; hgb 13.6; hct 42.9; mcv 93.5 plt 209; glucose 147; bun 35; creat 1.75; k+ 4.0 na++ 8.1; GFR 40 07-23-21: wbc 7.4; hgb 15.8; hct 50.3; mcv 94.7 plt 189; urine culture: enterococcus faecalis/providencia stuartii  08-09-21: wbc 8.3; hgb 14.5; hct  45.3; mcv 93.8 plt 192; glucose 127; bun 34; creat 1.83; k+ 4.1; na++ 140; ca 7.8; GFR 38; alk phos 130; albumin 2.6 urine culture: pseudomonas aeruginosa: cipro  NO NEW LABS.   Review of Systems  Constitutional:  Negative for malaise/fatigue.  Respiratory:  Negative for cough and shortness of breath.   Cardiovascular:  Negative for chest pain, palpitations and leg swelling.  Gastrointestinal:  Negative for abdominal pain, constipation and heartburn.  Musculoskeletal:  Negative for back pain, joint pain and myalgias.  Skin: Negative.   Neurological:  Negative for dizziness.  Psychiatric/Behavioral:  The patient is not nervous/anxious.    Physical Exam Constitutional:      General: He is not in acute distress.    Appearance: He is well-developed. He is not diaphoretic.  Neck:     Thyroid: No thyromegaly.  Cardiovascular:     Rate and Rhythm: Normal rate and regular rhythm.     Pulses: Normal pulses.     Heart sounds: Normal heart sounds.  Pulmonary:     Effort: Pulmonary effort is normal. No respiratory distress.     Breath sounds: Normal breath sounds.  Abdominal:     General: Bowel sounds are normal. There is no distension.     Palpations: Abdomen is soft.     Tenderness: There is no abdominal tenderness.  Genitourinary:    Comments:  Foley penile tear  Musculoskeletal:     Cervical back: Neck supple.     Right lower leg: No edema.     Left lower leg: No edema.     Comments: Right hemiplegia   Lymphadenopathy:     Cervical: No cervical adenopathy.  Skin:    General: Skin is warm and dry.  Neurological:     Mental Status: He is alert. Mental status is at baseline.  Psychiatric:        Mood and Affect: Mood normal.  ASSESSMENT/ PLAN:  TODAY  COVID 19 Will begin the following vit D 50,000 units weekly zinc daily; vit C daily for 2 weeks Will begin molnupiravir 250 mg 4 caps twice daily for 5 days    ADDENDUM:  The PCR is negative will stop the above  orders.    Ok Edwards NP [facility]  Contact [phone] Monday through Friday 8am- 5pm  After hours call [phone]

## 2021-09-14 LAB — HIGH SENSITIVITY CRP: CRP, High Sensitivity: 2.34 mg/L (ref 0.00–3.00)

## 2021-09-23 ENCOUNTER — Encounter: Payer: Self-pay | Admitting: Adult Health

## 2021-09-23 NOTE — Progress Notes (Signed)
Location:  [place] Room Number: 528-U Place of Service:  SNF (31)   CODE STATUS: DNR  No Known Allergies  Chief Complaint  Patient presents with  . Medical Management of Chronic Issues    Routine visit. Discuss need for shingrix and eye exam or exclude/postpone, if patient refuses.     HPI:    Past Medical History:  Diagnosis Date  . A-fib ([place])    a. Post-op afib after CABG 08/2012.  [name] Acute gastric ulcer with hemorrhage   . Acute respiratory failure with hypoxia ([place])   . Aphasia following cerebral infarction   . CAD (coronary artery disease)    a. NSTEMI s/p stent to distal RCA 03/2000. b. Inf MI s/p emergent thrombectomy/stenting mid RCA 10/2000. c. NSTEMI s/p CABGx4 (LIMA-LAD, SVG-OM2, seq SVG-acute marginal and PD) 12/14/22 - course complicated by confusion, post-op AF, L pleural effusion with thoracentesis. d. Normal LV function by echo 08/2012.  . Diverticulosis   . DM with CKD   . Dysphagia following cerebral infarction   . Encephalopathy   . Extended spectrum beta lactamase (ESBL) resistance   . Generalized anxiety disorder   . GERD (gastroesophageal reflux disease)   . Hemiplegia and hemiparesis following cerebral infarction affecting right dominant side ([place])   . Hiatal hernia   . HLD (hyperlipidemia)   . HTN (hypertension)   . Non-ST elevation (NSTEMI) myocardial infarction ([place])   . Nontraumatic intracerebral hemorrhage in hemisphere, subcortical ([place])   . Peripheral vascular disease ([place])    a. Carotid dopplers neg 08/13/12. b. Pre-cabg ABIs - R=0.87 suggesting mild dz, L=1.29 possibly falsely elevated due to calcified vessels.  . Pleural effusion    a. L pleural eff after CABG s/p thoracentesis 08/22/12.  . Prostate cancer [facility])    Status post radiation treatment.  . Prostatic hypertrophy    a. Hx of urinary retention, awaiting TURP.  [name] Severe protein-calorie malnutrition ([place])   . Stroke ([place])   . Tobacco abuse   . Unspecified  disorder of adult personality and behavior   . Valvular heart disease    a. Mild  MR by TEE 08/2012.    Past Surgical History:  Procedure Laterality Date  . APPENDECTOMY    . BACK SURGERY    . CORONARY ARTERY BYPASS GRAFT  08/16/2012   Procedure: CORONARY ARTERY BYPASS GRAFTING (CABG);  Surgeon: Melrose Nakayama, MD;  Location: [place];  Service: Open Heart Surgery;  Laterality: N/A;  . IR REPLC GASTRO/COLONIC TUBE PERCUT W/FLUORO  02/13/2019  . LEFT HEART CATHETERIZATION WITH CORONARY ANGIOGRAM N/A 08/14/2012   Procedure: LEFT HEART CATHETERIZATION WITH CORONARY ANGIOGRAM;  Surgeon: Peter M Martinique, MD;  Location: [facility] CATH LAB;  Service: Cardiovascular;  Laterality: N/A;    Social History   Socioeconomic History  . Marital status: Married    Spouse name: Not on file  . Number of children: Not on file  . Years of education: Not on file  . Highest education level: Not on file  Occupational History  . Occupation: retired   Tobacco Use  . Smoking status: Former    Types: Cigarettes    Quit date: 11/12/1972    Years since quitting: 48.8  . Smokeless tobacco: Never  Vaping Use  . Vaping Use: Never used  Substance and Sexual Activity  . Alcohol use: No  . Drug use: No  . Sexual activity: Not Currently  Other Topics Concern  . Not on file  Social History Narrative  He is a long term patient of [facility]    Social Determinants of Health   Financial Resource Strain: Not on file  Food Insecurity: Not on file  Transportation Needs: Not on file  Physical Activity: Not on file  Stress: Not on file  Social Connections: Not on file  Intimate Partner Violence: Not on file   Family History  Problem Relation Age of Onset  . Hypertension Mother       VITAL SIGNS BP 119/79   Pulse 63   Temp (!) 96.8 F (36 C)   Resp 20   Ht 5\' 11"  (1.803 m)   Wt 167 lb (75.8 kg)   SpO2 95%   BMI 23.29 kg/m   Outpatient Encounter Medications as of 09/23/2021  Medication Sig  . acetaminophen  (TYLENOL) 325 MG tablet Take 650 mg by mouth every 4 (four) hours as needed. per standing order for pain/fever DO NOT EXCEED >3,000 mg in 24 hours  . Amino Acids-Protein Hydrolys (FEEDING SUPPLEMENT, PRO-STAT SUGAR FREE 64,) LIQD Take 30 mLs by mouth 3 (three) times daily with meals. for albumin 2.6  . amLODipine (NORVASC) 5 MG tablet Take 5 mg by mouth daily.   [name] apixaban (ELIQUIS) 5 MG TABS tablet Take 5 mg by mouth 2 (two) times daily.  [name] atorvastatin (LIPITOR) 40 MG tablet Take 40 mg by mouth at bedtime.   . Baclofen 5 MG TABS Take 5 mg by mouth 3 (three) times daily. Nontraumatic intracerebral hemorrhage in hemisphere, subcortical  . Balsam [place]-Castor Oil (VENELEX) OINT Apply topically. Special Instructions: Apply to sacrum, coccyx and bilateral buttocks qshift for prevention/ redness. Every Shift Day, Evening, Night  . ergocalciferol (VITAMIN D2) 1.25 MG (50000 UT) capsule Take 50,000 Units by mouth once a week.  . Eyelid Cleansers (OCUSOFT LID SCRUB EX) daily. Cleanse both eye lids  . Glucerna (GLUCERNA) LIQD Take 237 mLs by mouth daily.  . hydrocortisone cream 1 % Apply 1 application topically as needed (for hemorrhoids).  . insulin glargine (LANTUS) 100 UNIT/ML injection Inject 20 Units into the skin at bedtime.  . insulin lispro (HUMALOG) 100 UNIT/ML cartridge Inject 10 Units into the skin 3 (three) times daily before meals. Give 10 units SQ with meals if Blood Sugar >150  . lansoprazole (PREVACID SOLUTAB) 30 MG disintegrating tablet Take 30 mg by mouth daily. Give before meals  . loperamide (IMODIUM A-D) 2 MG tablet Take 2 mg by mouth as needed for diarrhea or loose stools. May give another dose after each loose stool, up to 8 mg per 24 hours.  [name] losartan (COZAAR) 50 MG tablet Take 50 mg by mouth daily.  . Melatonin 1 MG TABS Take 2 tablets by mouth at bedtime.   . NON FORMULARY Diet Change: Regular, thin liquids  . ondansetron (ZOFRAN-ODT) 4 MG disintegrating tablet Take 4 mg by  mouth every 6 (six) hours as needed for nausea or vomiting.  [name] oxybutynin (DITROPAN XL) 15 MG 24 hr tablet Take 15 mg by mouth daily.  . polyethylene glycol (MIRALAX / GLYCOLAX) 17 g packet Take 17 g by mouth daily as needed.  [name] rOPINIRole (REQUIP) 0.25 MG tablet Take 0.25 mg by mouth at bedtime.  . [DISCONTINUED] molnupiravir EUA (LAGEVRIO) 200 MG CAPS capsule Take 4 capsules by mouth every 12 (twelve) hours. x 5 days - Covid-19/ received wifes permission  . [DISCONTINUED] vitamin C (ASCORBIC ACID) 500 MG tablet Take 500 mg by mouth 2 (two) times daily.  . [DISCONTINUED] Zinc Sulfate (ZINC-220  PO) Take by mouth.   No facility-administered encounter medications on file as of 09/23/2021.     SIGNIFICANT DIAGNOSTIC EXAMS       ASSESSMENT/ PLAN:     Ok Edwards NP [facility]  Contact [phone] Monday through Friday 8am- 5pm  After hours call [phone]   This encounter was created in error - please disregard.

## 2021-09-28 ENCOUNTER — Encounter: Payer: Self-pay | Admitting: Internal Medicine

## 2021-09-28 ENCOUNTER — Non-Acute Institutional Stay (SKILLED_NURSING_FACILITY): Payer: Medicare PPO | Admitting: Internal Medicine

## 2021-09-28 DIAGNOSIS — R310 Gross hematuria: Secondary | ICD-10-CM | POA: Diagnosis not present

## 2021-09-28 DIAGNOSIS — I4811 Longstanding persistent atrial fibrillation: Secondary | ICD-10-CM

## 2021-09-28 NOTE — Progress Notes (Signed)
NURSING HOME LOCATION:  [facility] ROOM NUMBER:  130 P  CODE STATUS:  DNR  PCP:  Ok Edwards NP  This is a nursing facility follow up visit for specific acute issue of gross blood in Foley bag.  Interim medical record and care since last SNF visit was updated with review of diagnostic studies and change in clinical status since last visit were documented.  HPI: Staff reported gross blood in the Foley catheter which apparently is an exacerbation of pre-existing issue. Significantly he is on Eliquis 5 mg twice daily. Foley catheter placement is in the context of CVA, probable neurogenic bladder, and symptomatic BPH.  Urologist Dr. Franchot Gallo has been following the patient. He has had an indwelling Foley catheter which is changed monthly.  Urologic history also includes bladder spasms for which he is on oxybutynin daily.  Despite this urine leakage has been documented.  The Foley placement has been complicated by traumatic hypospadias.  The question of suprapubic tube placement has been raised.  Urology assessment was last completed 03/09/2021.  It was Dr. Alan Ripper opinion that the catheter change monthly was adequate as any leakage was felt mainly due to the bladder spasms.  He did not feel that upsizing the catheter would address this issue.  At that time oxybutynin was changed to controlled release formulation.  Suprapubic tube placement was not felt to be necessary at that point.  Past medical history includes acute gastric ulcer with hemorrhage and nontraumatic intracerebral hemorrhage in the subcortical hemisphere.  Most current CBC and differential was completed 10/3 and revealed H/H of 15.3/48.5 with a platelet count of 180,000.  Urine culture 8/29 revealed over 100,000 Pseudomonas.  Culture on 8/12 revealed over 100,000 enterococcal species.  Review of systems: Dementia invalidated responses.  He denied any genitourinary or bleeding dyscrasia symptoms or  signs.  His response to all queries was a monosyllabic "no".  The only time he spoke more than 1 word was to instruct me to turn off the light as I was leaving his room following the exam.  Constitutional: No fever, significant weight change  Cardiovascular: No chest pain, palpitations, paroxysmal nocturnal dyspnea,  edema  Respiratory: No cough, sputum production, hemoptysis Gastrointestinal: No heartburn, dysphagia, abdominal pain, nausea /vomiting, rectal bleeding, melena, change in bowels Genitourinary: No dysuria, hematuria, pyuria, incontinence, nocturia Dermatologic: No rash, pruritus, change in appearance of skin Neurologic: No dizziness, headache, syncope, seizures, numbness, tingling Endocrine: No change in hair/skin/nails, excessive thirst, excessive hunger, excessive urination  Hematologic/lymphatic: No significant bruising, lymphadenopathy, abnormal bleeding  Physical exam:  Pertinent or positive findings: He is somewhat lethargic and exhibits blank facies.  Eyebrows are decreased laterally.  Teeth are immaculate.  Heart rhythm is irregular but rate is well controlled.  Breath sounds are slightly decreased on the left compared to the right.  Abdomen is protuberant.  There is dark blood in the urine collection bag.  Pedal pulses are decreased.  He has trace edema at the ankles.  He is weak to opposition on the left upper and lower extremities but exhibits no opposition on the right side.  General appearance: Adequately nourished; no acute distress, increased work of breathing is present.   Lymphatic: No lymphadenopathy about the head, neck, axilla. Eyes: No conjunctival inflammation or lid edema is present. There is no scleral icterus. Ears:  External ear exam shows no significant lesions or deformities.   Nose:  External nasal examination shows no deformity or inflammation. Nasal mucosa are pink and  moist without lesions, exudates Oral exam:  Lips and gums are healthy appearing.  There is no oropharyngeal erythema or exudate. Neck:  No thyromegaly, masses, tenderness noted.    Heart:  No gallop, murmur, click, rub .  Lungs:  without wheezes, rhonchi, rales, rubs. Abdomen: Bowel sounds are normal. Abdomen is soft and nontender with no organomegaly, hernias, masses. GU: Deferred  Extremities:  No cyanosis, clubbing  Skin: Warm & dry w/o tenting. No significant lesions or rash.  See summary under each active problem in the Problem List with associated updated therapeutic plan

## 2021-09-28 NOTE — Patient Instructions (Signed)
See assessment and plan under each diagnosis acutely for this visit

## 2021-09-28 NOTE — Assessment & Plan Note (Addendum)
Urinalysis and C&S will be collected as he did have Pseudomonas and enterococcal isolates on urine culture in August. CBC and differential will be repeated. In the absence of any other bleeding dyscrasias and his neurovascular history; Eliquis will not be held unless there is evidence of systemic bleeding dyscrasias.

## 2021-09-29 ENCOUNTER — Encounter (HOSPITAL_COMMUNITY)
Admission: RE | Admit: 2021-09-29 | Discharge: 2021-09-29 | Disposition: A | Discharge status: Home / Self Care | Payer: Medicare PPO | Source: Skilled Nursing Facility | Attending: Adult Health | Admitting: Adult Health

## 2021-09-29 ENCOUNTER — Inpatient Hospital Stay
Admission: RE | Admit: 2021-09-29 | Discharge: 2023-01-02 | Disposition: A | Discharge status: Skilled Nursing Facility | Payer: Medicare PPO | Source: Ambulatory Visit | Attending: Internal Medicine | Admitting: Internal Medicine

## 2021-09-29 ENCOUNTER — Encounter (HOSPITAL_COMMUNITY): Payer: Self-pay | Admitting: Emergency Medicine

## 2021-09-29 ENCOUNTER — Other Ambulatory Visit: Payer: Self-pay

## 2021-09-29 ENCOUNTER — Emergency Department (HOSPITAL_COMMUNITY)
Admission: EM | Admit: 2021-09-29 | Discharge: 2021-09-29 | Disposition: A | Discharge status: Home / Self Care | Payer: Medicare PPO | Source: Home / Self Care | Attending: Emergency Medicine | Admitting: Emergency Medicine

## 2021-09-29 DIAGNOSIS — I129 Hypertensive chronic kidney disease with stage 1 through stage 4 chronic kidney disease, or unspecified chronic kidney disease: Secondary | ICD-10-CM | POA: Insufficient documentation

## 2021-09-29 DIAGNOSIS — N183 Chronic kidney disease, stage 3 unspecified: Secondary | ICD-10-CM | POA: Insufficient documentation

## 2021-09-29 DIAGNOSIS — R319 Hematuria, unspecified: Secondary | ICD-10-CM | POA: Diagnosis not present

## 2021-09-29 DIAGNOSIS — Z79899 Other long term (current) drug therapy: Secondary | ICD-10-CM | POA: Insufficient documentation

## 2021-09-29 DIAGNOSIS — I4891 Unspecified atrial fibrillation: Secondary | ICD-10-CM | POA: Insufficient documentation

## 2021-09-29 DIAGNOSIS — Z8546 Personal history of malignant neoplasm of prostate: Secondary | ICD-10-CM | POA: Insufficient documentation

## 2021-09-29 DIAGNOSIS — Z7901 Long term (current) use of anticoagulants: Secondary | ICD-10-CM | POA: Insufficient documentation

## 2021-09-29 DIAGNOSIS — N390 Urinary tract infection, site not specified: Secondary | ICD-10-CM | POA: Insufficient documentation

## 2021-09-29 DIAGNOSIS — Z951 Presence of aortocoronary bypass graft: Secondary | ICD-10-CM | POA: Insufficient documentation

## 2021-09-29 DIAGNOSIS — Z794 Long term (current) use of insulin: Secondary | ICD-10-CM | POA: Insufficient documentation

## 2021-09-29 DIAGNOSIS — Z87891 Personal history of nicotine dependence: Secondary | ICD-10-CM | POA: Insufficient documentation

## 2021-09-29 DIAGNOSIS — I639 Cerebral infarction, unspecified: Principal | ICD-10-CM

## 2021-09-29 DIAGNOSIS — I2510 Atherosclerotic heart disease of native coronary artery without angina pectoris: Secondary | ICD-10-CM | POA: Insufficient documentation

## 2021-09-29 DIAGNOSIS — E1122 Type 2 diabetes mellitus with diabetic chronic kidney disease: Secondary | ICD-10-CM | POA: Insufficient documentation

## 2021-09-29 LAB — BASIC METABOLIC PANEL
Anion gap: 5 (ref 5–15)
BUN: 50 mg/dL — ABNORMAL HIGH (ref 8–23)
CO2: 24 mmol/L (ref 22–32)
Calcium: 7.9 mg/dL — ABNORMAL LOW (ref 8.9–10.3)
Chloride: 105 mmol/L (ref 98–111)
Creatinine, Ser: 2.04 mg/dL — ABNORMAL HIGH (ref 0.61–1.24)
GFR, Estimated: 33 mL/min — ABNORMAL LOW (ref >60)
Glucose, Bld: 168 mg/dL — ABNORMAL HIGH (ref 70–99)
Potassium: 4.5 mmol/L (ref 3.5–5.1)
Sodium: 134 mmol/L — ABNORMAL LOW (ref 135–145)

## 2021-09-29 LAB — URINALYSIS, MICROSCOPIC (REFLEX): RBC / HPF: >50 RBC/hpf (ref 0–5)

## 2021-09-29 LAB — CBC WITH DIFFERENTIAL/PLATELET
Abs Immature Granulocytes: 0.03 K/uL (ref 0.00–0.07)
Abs Immature Granulocytes: 0.03 K/uL (ref 0.00–0.07)
Basophils Absolute: 0.1 K/uL (ref 0.0–0.1)
Basophils Absolute: 0.1 K/uL (ref 0.0–0.1)
Basophils Relative: 1 %
Basophils Relative: 1 %
Eosinophils Absolute: 0.6 K/uL — ABNORMAL HIGH (ref 0.0–0.5)
Eosinophils Absolute: 0.7 K/uL — ABNORMAL HIGH (ref 0.0–0.5)
Eosinophils Relative: 8 %
Eosinophils Relative: 9 %
HCT: 46.9 % (ref 39.0–52.0)
HCT: 45.1 % (ref 39.0–52.0)
Hemoglobin: 14.9 g/dL (ref 13.0–17.0)
Hemoglobin: 14.1 g/dL (ref 13.0–17.0)
Immature Granulocytes: 0 %
Immature Granulocytes: 0 %
Lymphocytes Relative: 24 %
Lymphocytes Relative: 28 %
Lymphs Abs: 2 K/uL (ref 0.7–4.0)
Lymphs Abs: 2.1 K/uL (ref 0.7–4.0)
MCH: 30 pg (ref 26.0–34.0)
MCH: 29.6 pg (ref 26.0–34.0)
MCHC: 31.8 g/dL (ref 30.0–36.0)
MCHC: 31.3 g/dL (ref 30.0–36.0)
MCV: 94.4 fL (ref 80.0–100.0)
MCV: 94.7 fL (ref 80.0–100.0)
Monocytes Absolute: 0.5 K/uL (ref 0.1–1.0)
Monocytes Absolute: 0.6 K/uL (ref 0.1–1.0)
Monocytes Relative: 7 %
Monocytes Relative: 8 %
Neutro Abs: 4.8 K/uL (ref 1.7–7.7)
Neutro Abs: 4 K/uL (ref 1.7–7.7)
Neutrophils Relative %: 60 %
Neutrophils Relative %: 54 %
Platelets: 170 K/uL (ref 150–400)
Platelets: 185 K/uL (ref 150–400)
RBC: 4.97 MIL/uL (ref 4.22–5.81)
RBC: 4.76 MIL/uL (ref 4.22–5.81)
RDW: 13.2 % (ref 11.5–15.5)
RDW: 13.1 % (ref 11.5–15.5)
WBC: 8 K/uL (ref 4.0–10.5)
WBC: 7.5 K/uL (ref 4.0–10.5)
nRBC: 0 % (ref 0.0–0.2)
nRBC: 0 % (ref 0.0–0.2)

## 2021-09-29 LAB — URINALYSIS, ROUTINE W REFLEX MICROSCOPIC
Bilirubin Urine: NEGATIVE
Glucose, UA: NEGATIVE mg/dL
Ketones, ur: NEGATIVE mg/dL
Nitrite: NEGATIVE
Protein, ur: 100 mg/dL — AB
Specific Gravity, Urine: 1.02 (ref 1.005–1.030)
pH: 5 (ref 5.0–8.0)

## 2021-09-29 MED ORDER — CIPROFLOXACIN HCL 500 MG PO TABS: 500.0000 mg | ORAL_TABLET | Freq: Two times a day (BID) | ORAL | 0 refills | Status: DC

## 2021-09-29 MED ORDER — CIPRO 500 MG/5ML (10%) PO SUSR: 500.0000 mg | Freq: Two times a day (BID) | ORAL | 0 refills | Status: DC

## 2021-09-29 MED ORDER — CIPROFLOXACIN HCL 250 MG PO TABS: 500.0000 mg | ORAL_TABLET | Freq: Once | ORAL | Status: DC

## 2021-09-29 MED ORDER — CIPROFLOXACIN 500 MG/5ML (10%) PO SUSR: 500.0000 mg | ORAL | Status: DC

## 2021-09-29 MED ORDER — CIPRO 500 MG/5ML (10%) PO SUSR: 500.0000 mg | Freq: Two times a day (BID) | ORAL | 0 refills | Status: AC

## 2021-09-29 NOTE — Assessment & Plan Note (Signed)
Rhythm slightly irregular but rate controlled. On Eliquis

## 2021-09-29 NOTE — ED Provider Notes (Signed)
[facility] Provider Note   CSN: [id number] Arrival date & time: 09/29/21  1630     History No chief complaint on file.   [name] is a 75 y.o. male.  HPI  This patient is a 75 year old male with a history of atrial fibrillation after having a CABG in 2013.  He has a history of prior intracranial hemorrhage causing a stroke with right-sided deficits.  He is currently living at the [facility] and has an indwelling Foley catheter.  The patient has had several days of some hematuria but no clogging of the catheter.  The catheter was changed this morning and because of the ongoing hematuria and the inability to get an expedited urology appointment he was evidently sent to the emergency department for evaluation.  There was no report of blockage of the catheter, no fevers or chills the patient has no abdominal pain, no nausea or vomiting, no back pain, no swelling, no headache, no sore throat runny nose cough and congestion or shortness of breath.  The patient is otherwise asymptomatic.  He continues to take Eliquis twice daily for prior history of vascular thrombosis of his popliteal vessels and atrial fibrillation.  Past Medical History:  Diagnosis Date   A-fib [facility]    a. Post-op afib after CABG 08/2012.   Acute gastric ulcer with hemorrhage    Acute respiratory failure with hypoxia (HCC)    Aphasia following cerebral infarction    CAD (coronary artery disease)    a. NSTEMI s/p stent to distal RCA 03/2000. b. Inf MI s/p emergent thrombectomy/stenting mid RCA 10/2000. c. NSTEMI s/p CABGx4 (LIMA-LAD, SVG-OM2, seq SVG-acute marginal and PD) 8/0/99 - course complicated by confusion, post-op AF, L pleural effusion with thoracentesis. d. Normal LV function by echo 08/2012.   Diverticulosis    DM with CKD    Dysphagia following cerebral infarction    Encephalopathy    Extended spectrum beta lactamase (ESBL) resistance    Generalized anxiety disorder    GERD  (gastroesophageal reflux disease)    Hemiplegia and hemiparesis following cerebral infarction affecting right dominant side (HCC)    Hiatal hernia    HLD (hyperlipidemia)    HTN (hypertension)    Non-ST elevation (NSTEMI) myocardial infarction [facility])    Nontraumatic intracerebral hemorrhage in hemisphere, subcortical [facility])    Peripheral vascular disease ([place])    a. Carotid dopplers neg 08/13/12. b. Pre-cabg ABIs - R=0.87 suggesting mild dz, L=1.29 possibly falsely elevated due to calcified vessels.   Pleural effusion    a. L pleural eff after CABG s/p thoracentesis 08/22/12.   Prostate cancer [facility])    Status post radiation treatment.   Prostatic hypertrophy    a. Hx of urinary retention, awaiting TURP.   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 Mild  MR by TEE 08/2012.    Patient Active Problem List   Diagnosis Date Noted   Gross hematuria 09/28/2021   UTI (urinary tract infection) due to urinary indwelling catheter ([place]) 08/18/2021   Enterococcus faecalis infection 08/02/2021   RLS (restless legs syndrome) 07/13/2021   Acute deep vein thrombosis (DVT) of right lower extremity ([place]) 05/12/2021   Bladder spasms 02/12/2021   Aortic atherosclerosis ([place]) 12/17/2020   Expressive aphasia 11/18/2020   Bruise 11/18/2020   Right spastic hemiplegia ([place]) 05/27/2020   Microalbuminuria due  to type 2 diabetes mellitus ([place]) 06/04/2019   CKD stage 3 due to type 2 diabetes mellitus ([place]) 06/04/2019   Foley catheter in place 04/25/2019   Penis injury, sequela 04/25/2019   Hypertension associated with diabetes ([place]) 01/28/2019   Dyslipidemia associated with type 2 diabetes mellitus ([place]) 01/28/2019   Dysphagia, oropharyngeal phase 01/28/2019   Type 2 diabetes mellitus with peripheral vascular disease ([place]) 01/28/2019   Major depression with psychotic features ([place])  01/28/2019   Protein-calorie malnutrition, severe 01/08/2019   History of hemorrhagic cerebrovascular accident (CVA) with residual deficit 01/04/2019   Urinary retention 12/24/2018   Nontraumatic subcortical hemorrhage of left cerebral hemisphere ([place]) 11/18/2018   CAD (coronary artery disease)    Peripheral vascular disease ([place])    A-fib One Episode after CABG in 2013    HTN (hypertension) 08/15/2012    Past Surgical History:  Procedure Laterality Date   APPENDECTOMY     BACK SURGERY     CORONARY ARTERY BYPASS GRAFT  08/16/2012   Procedure: CORONARY ARTERY BYPASS GRAFTING (CABG);  Surgeon: Melrose Nakayama, MD;  Location: [facility];  Service: Open Heart Surgery;  Laterality: N/A;   IR REPLC GASTRO/COLONIC TUBE PERCUT W/FLUORO  02/13/2019   LEFT HEART CATHETERIZATION WITH CORONARY ANGIOGRAM N/A 08/14/2012   Procedure: LEFT HEART CATHETERIZATION WITH CORONARY ANGIOGRAM;  Surgeon: Peter M Martinique, MD;  Location: [facility] CATH LAB;  Service: Cardiovascular;  Laterality: N/A;       Family History  Problem Relation Age of Onset   Hypertension Mother     Social History   Tobacco Use   Smoking status: Former    Types: Cigarettes    Quit date: 11/12/1972    Years since quitting: 48.9   Smokeless tobacco: Never  Vaping Use   Vaping Use: Never used  Substance Use Topics   Alcohol use: No   Drug use: No    Home Medications Prior to Admission medications   Medication Sig Start Date End Date Taking? Authorizing Provider  acetaminophen (TYLENOL) 325 MG tablet Take 650 mg by mouth every 4 (four) hours as needed. per standing order for pain/fever DO NOT EXCEED >3,000 mg in 24 hours 06/16/19  Yes [provider]  Amino Acids-Protein Hydrolys (FEEDING SUPPLEMENT, PRO-STAT SUGAR FREE 64,) LIQD Take 30 mLs by mouth 3 (three) times daily with meals. for albumin 2.6   Yes [provider]  amLODipine (NORVASC) 5 MG tablet Take 5 mg by mouth daily.    Yes [provider]   apixaban (ELIQUIS) 5 MG TABS tablet Take 5 mg by mouth 2 (two) times daily.   Yes [provider]  atorvastatin (LIPITOR) 40 MG tablet Take 40 mg by mouth at bedtime.  01/29/19  Yes [provider]  Baclofen 5 MG TABS Take 5 mg by mouth 3 (three) times daily. Nontraumatic intracerebral hemorrhage in hemisphere, subcortical   Yes [provider]  Janne Lab Oil (VENELEX) OINT Apply topically. Special Instructions: Apply to sacrum, coccyx and bilateral buttocks qshift for prevention/ redness. Every Shift Day, Evening, Night 01/24/21  Yes [provider]  ergocalciferol (VITAMIN D2) 1.25 MG (50000 UT) capsule Take 50,000 Units by mouth once a week.   Yes [provider]  Eyelid Cleansers (OCUSOFT LID SCRUB EX) daily. Cleanse both eye lids   Yes [provider]  Glucerna (GLUCERNA) LIQD Take 237 mLs by mouth daily.   Yes [provider]  insulin glargine (LANTUS) 100 UNIT/ML injection Inject 20 Units into the skin  daily.   Yes [provider]  insulin lispro (HUMALOG) 100 UNIT/ML cartridge Inject 10 Units into the skin 3 (three) times daily before meals. Give 10 units SQ with meals if Blood Sugar >150   Yes [provider]  lansoprazole (PREVACID SOLUTAB) 30 MG disintegrating tablet Take 30 mg by mouth daily. Give before meals 04/03/19  Yes [provider]  loratadine (CLARITIN) 10 MG tablet Take 10 mg by mouth daily.   Yes [provider]  losartan (COZAAR) 50 MG tablet Take 50 mg by mouth daily.   Yes [provider]  Melatonin 1 MG TABS Take 2 tablets by mouth at bedtime.  01/30/19  Yes [provider]  ondansetron (ZOFRAN-ODT) 4 MG disintegrating tablet Take 4 mg by mouth every 6 (six) hours as needed for nausea or vomiting.   Yes [provider]  oxybutynin (DITROPAN XL) 15 MG 24 hr tablet Take 15 mg by mouth daily.   Yes [provider]  polyethylene glycol  (MIRALAX / GLYCOLAX) 17 g packet Take 17 g by mouth daily as needed. 08/24/20  Yes [provider]  rOPINIRole (REQUIP) 0.25 MG tablet Take 0.25 mg by mouth at bedtime.   Yes [provider]  ciprofloxacin (CIPRO) 500 MG tablet Take 1 tablet (500 mg total) by mouth 2 (two) times daily. 09/29/21   Noemi Chapel, MD  hydrocortisone cream 1 % Apply 1 application topically as needed (for hemorrhoids). 01/12/19   [provider]  loperamide (IMODIUM A-D) 2 MG tablet Take 2 mg by mouth as needed for diarrhea or loose stools. May give another dose after each loose stool, up to 8 mg per 24 hours. 12/19/20   [provider]  NON FORMULARY Diet Change: Regular, thin liquids 06/21/19   [provider]    Allergies    Patient has no known allergies.  Review of Systems   Review of Systems  All other systems reviewed and are negative.  Physical Exam Updated Vital Signs BP 123/73   Pulse 69   Resp 17   Ht 1.803 m (5\' 11" )   Wt 75.8 kg   SpO2 95%   BMI 23.31 kg/m   Physical Exam Vitals and nursing note reviewed.  Constitutional:      General: He is not in acute distress.    Appearance: He is well-developed.  HENT:     Head: Normocephalic and atraumatic.     Mouth/Throat:     Pharynx: No oropharyngeal exudate.  Eyes:     General: No scleral icterus.       Right eye: No discharge.        Left eye: No discharge.     Conjunctiva/sclera: Conjunctivae normal.     Pupils: Pupils are equal, round, and reactive to light.  Neck:     Thyroid: No thyromegaly.     Vascular: No JVD.  Cardiovascular:     Rate and Rhythm: Normal rate and regular rhythm.     Heart sounds: Normal heart sounds. No murmur heard.   No friction rub. No gallop.  Pulmonary:     Effort: Pulmonary effort is normal. No respiratory distress.     Breath sounds: Normal breath sounds. No wheezing or rales.  Abdominal:     General: Bowel sounds are normal. There is no distension.      Palpations: Abdomen is soft. There is no mass.     Tenderness: There is no abdominal tenderness.     Comments: Soft nondistended  abdomen, nontender, there is no masses palpated in the suprapubic region  Genitourinary:    Comments: Normal-appearing penis scrotum and testicles, Foley catheter is well-seated in the urethral meatus.  The Foley catheter tubing has some visible hematuria but no clots Musculoskeletal:        General: No tenderness. Normal range of motion.     Cervical back: Normal range of motion and neck supple.  Lymphadenopathy:     Cervical: No cervical adenopathy.  Skin:    General: Skin is warm and dry.     Findings: No erythema or rash.  Neurological:     Mental Status: He is alert.     Coordination: Coordination normal.     Comments: Normal speech, right-sided hemiparesis of the arm and leg.  His left side has normal function  Psychiatric:        Behavior: Behavior normal.    ED Results / Procedures / Treatments   Labs (all labs ordered are listed, but only abnormal results are displayed) Labs Reviewed  CBC WITH DIFFERENTIAL/PLATELET - Abnormal; Notable for the following components:      Result Value   Eosinophils Absolute 0.7 (*)    All other components within normal limits  BASIC METABOLIC PANEL - Abnormal; Notable for the following components:   Sodium 134 (*)    Glucose, Bld 168 (*)    BUN 50 (*)    Creatinine, Ser 2.04 (*)    Calcium 7.9 (*)    GFR, Estimated 33 (*)    All other components within normal limits  URINALYSIS, ROUTINE W REFLEX MICROSCOPIC - Abnormal; Notable for the following components:   APPearance CLOUDY (*)    Hgb urine dipstick LARGE (*)    Protein, ur 100 (*)    Leukocytes,Ua MODERATE (*)    All other components within normal limits  URINALYSIS, MICROSCOPIC (REFLEX) - Abnormal; Notable for the following components:   Bacteria, UA RARE (*)    All other components within normal limits  URINE CULTURE    EKG None  Radiology No  results found.  Procedures Procedures   Medications Ordered in ED Medications  ciprofloxacin (CIPRO) tablet 500 mg (has no administration in time range)    ED Course  I have reviewed the triage vital signs and the nursing notes.  Pertinent labs & imaging results that were available during my care of the patient were reviewed by me and considered in my medical decision making (see chart for details).  Clinical Course as of 09/29/21 1814  Wed Sep 29, 2021  1811 Patient has no anemia, the creatinine seems to be close to baseline, the urine does show some blood but no signs of obvious infection, will add Cipro twice daily [BM]    Clinical Course User Index [BM] Noemi Chapel, MD   MDM Rules/Calculators/A&P                           Exam is unremarkable, he is not tachycardic or febrile, he has an indwelling Foley with some hematuria.  He is anticoagulated.  He does not appear to be in distress at all, we will check a CBC to make sure he is not severely anemic, urinalysis to make sure there is no infection, he can likely follow-up outpatient unless there is significant or severe abnormalities.  The catheter is draining appropriately without any difficulty, no need to irrigate at this time  Patient stable for discharge on Cipro  Final Clinical  Impression(s) / ED Diagnoses Final diagnoses:  Hematuria, unspecified type    Rx / DC Orders ED Discharge Orders          Ordered    ciprofloxacin (CIPRO) 500 MG tablet  2 times daily,   Status:  Discontinued        09/29/21 1813    ciprofloxacin (CIPRO) 500 MG tablet  2 times daily        09/29/21 1813             Noemi Chapel, MD 09/29/21 1814

## 2021-09-29 NOTE — ED Triage Notes (Signed)
Pt to the ED from the [facility] with a chronic foley and hematuria for the last 3-4 days.   [facility] called urology and was unable to get an appointment before November.

## 2021-09-29 NOTE — ED Notes (Signed)
Looking for discharge papers .[name]

## 2021-09-29 NOTE — Discharge Instructions (Signed)
Your testing was rather unremarkable, you are not anemic, there is no obvious urinary tract infection but because of the bleeding we will start an antibiotic for the week.  The culture will be done in 2 days.  You will need to see your doctor, your urologist at your next appointment, if you have not made 1 it should be made and you should be seen by Monday at the latest  ER for worsening symptoms

## 2021-09-30 ENCOUNTER — Non-Acute Institutional Stay (SKILLED_NURSING_FACILITY): Payer: Medicare PPO | Admitting: Adult Health

## 2021-09-30 DIAGNOSIS — I610 Nontraumatic intracerebral hemorrhage in hemisphere, subcortical: Secondary | ICD-10-CM

## 2021-09-30 DIAGNOSIS — R310 Gross hematuria: Secondary | ICD-10-CM

## 2021-09-30 NOTE — [place] Notes (Signed)
Location:  [place] Room Number: 130 Place of Service:  SNF (31)   CODE STATUS: dnr   No Known Allergies  Chief Complaint  Patient presents with   Acute Visit    Follow up ED visit     HPI:  He had frank blood in his urine yesterday. Unfortunately urology was unable to see him; he does have an appointment in early November. He went to the ED. His u/a does demonstrate a possible infection. He was placed on cipro. This AM his urine is without obvious signs of bleeding present. There are no reports of fevers; there are no indications of pain present.   Past Medical History:  Diagnosis Date   A-fib [facility])    a. Post-op afib after CABG 08/2012.   Acute gastric ulcer with hemorrhage    Acute respiratory failure with hypoxia (HCC)    Aphasia following cerebral infarction    CAD (coronary artery disease)    a. NSTEMI s/p stent to distal RCA 03/2000. b. Inf MI s/p emergent thrombectomy/stenting mid RCA 10/2000. c. NSTEMI s/p CABGx4 (LIMA-LAD, SVG-OM2, seq SVG-acute marginal and PD) 02/13/69 - course complicated by confusion, post-op AF, L pleural effusion with thoracentesis. d. Normal LV function by echo 08/2012.   Diverticulosis    DM with CKD    Dysphagia following cerebral infarction    Encephalopathy    Extended spectrum beta lactamase (ESBL) resistance    Generalized anxiety disorder    GERD (gastroesophageal reflux disease)    Hemiplegia and hemiparesis following cerebral infarction affecting right dominant side (HCC)    Hiatal hernia    HLD (hyperlipidemia)    HTN (hypertension)    Non-ST elevation (NSTEMI) myocardial infarction [facility])    Nontraumatic intracerebral hemorrhage in hemisphere, subcortical [facility])    Peripheral vascular disease ([place])    a. Carotid dopplers neg 08/13/12. b. Pre-cabg ABIs - R=0.87 suggesting mild dz, L=1.29 possibly falsely elevated due to calcified vessels.   Pleural effusion    a. L pleural eff after CABG s/p thoracentesis  08/22/12.   Prostate cancer [facility])    Status post radiation treatment.   Prostatic hypertrophy    a. Hx of urinary retention, awaiting TURP.   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 Mild  MR by TEE 08/2012.    Past Surgical History:  Procedure Laterality Date   APPENDECTOMY     BACK SURGERY     CORONARY ARTERY BYPASS GRAFT  08/16/2012   Procedure: CORONARY ARTERY BYPASS GRAFTING (CABG);  Surgeon: Melrose Nakayama, MD;  Location: [place];  Service: Open Heart Surgery;  Laterality: N/A;   IR REPLC GASTRO/COLONIC TUBE PERCUT W/FLUORO  02/13/2019   LEFT HEART CATHETERIZATION WITH CORONARY ANGIOGRAM N/A 08/14/2012   Procedure: LEFT HEART CATHETERIZATION WITH CORONARY ANGIOGRAM;  Surgeon: Peter M Martinique, MD;  Location: [facility] CATH LAB;  Service: Cardiovascular;  Laterality: N/A;    Social History   Socioeconomic History   Marital status: Married    Spouse name: Not on file   Number of children: Not on file   Years of education: Not on file   Highest education level: Not on file  Occupational History   Occupation: retired   Tobacco Use   Smoking status: Former    Types: Cigarettes    Quit date: 11/12/1972  Years since quitting: 48.9   Smokeless tobacco: Never  Vaping Use   Vaping Use: Never used  Substance and Sexual Activity   Alcohol use: No   Drug use: No   Sexual activity: Not Currently  Other Topics Concern   Not on file  Social History Narrative   He is a long term patient of [facility]    Social Determinants of Health   Financial Resource Strain: Not on file  Food Insecurity: Not on file  Transportation Needs: Not on file  Physical Activity: Not on file  Stress: Not on file  Social Connections: Not on file  Intimate Partner Violence: Not on file   Family History  Problem Relation Age of Onset   Hypertension Mother       VITAL SIGNS BP (!)  127/57   Pulse 78   Temp (!) 96.8 F (36 C)   Ht _0  (1.803 m)   Wt 167 lb (75.8 kg)   BMI 23.29 kg/m   Outpatient Encounter Medications as of 09/30/2021  Medication Sig   acetaminophen (TYLENOL) 325 MG tablet Take 650 mg by mouth every 4 (four) hours as needed. per standing order for pain/fever DO NOT EXCEED >3,000 mg in 24 hours   Amino Acids-Protein Hydrolys (FEEDING SUPPLEMENT, PRO-STAT SUGAR FREE 64,) LIQD Take 30 mLs by mouth 3 (three) times daily with meals. for albumin 2.6   amLODipine (NORVASC) 5 MG tablet Take 5 mg by mouth daily.    apixaban (ELIQUIS) 5 MG TABS tablet Take 5 mg by mouth 2 (two) times daily.   atorvastatin (LIPITOR) 40 MG tablet Take 40 mg by mouth at bedtime.    Baclofen 5 MG TABS Take 5 mg by mouth 3 (three) times daily. Nontraumatic intracerebral hemorrhage in hemisphere, subcortical   Balsam [place]-Castor Oil (VENELEX) OINT Apply topically. Special Instructions: Apply to sacrum, coccyx and bilateral buttocks qshift for prevention/ redness. Every Shift Day, Evening, Night   ciprofloxacin (CIPRO) 500 MG tablet Take 1 tablet (500 mg total) by mouth 2 (two) times daily.   ciprofloxacin (CIPRO) 500 MG/5ML (10%) suspension Take 5 mLs (500 mg total) by mouth 2 (two) times daily for 5 days. Do NOT administer via tube   ergocalciferol (VITAMIN D2) 1.25 MG (50000 UT) capsule Take 50,000 Units by mouth once a week.   Eyelid Cleansers (OCUSOFT LID SCRUB EX) daily. Cleanse both eye lids   Glucerna (GLUCERNA) LIQD Take 237 mLs by mouth daily.   hydrocortisone cream 1 % Apply 1 application topically as needed (for hemorrhoids).   insulin glargine (LANTUS) 100 UNIT/ML injection Inject 20 Units into the skin daily.   insulin lispro (HUMALOG) 100 UNIT/ML cartridge Inject 10 Units into the skin 3 (three) times daily before meals. Give 10 units SQ with meals if Blood Sugar >150   lansoprazole (PREVACID SOLUTAB) 30 MG disintegrating tablet Take 30 mg by mouth daily. Give before  meals   loperamide (IMODIUM A-D) 2 MG tablet Take 2 mg by mouth as needed for diarrhea or loose stools. May give another dose after each loose stool, up to 8 mg per 24 hours.   loratadine (CLARITIN) 10 MG tablet Take 10 mg by mouth daily.   losartan (COZAAR) 50 MG tablet Take 50 mg by mouth daily.   Melatonin 1 MG TABS Take 2 tablets by mouth at bedtime.    NON FORMULARY Diet Change: Regular, thin liquids   ondansetron (ZOFRAN-ODT) 4 MG disintegrating tablet Take 4 mg by mouth every 6 (six) hours  as needed for nausea or vomiting.   oxybutynin (DITROPAN XL) 15 MG 24 hr tablet Take 15 mg by mouth daily.   polyethylene glycol (MIRALAX / GLYCOLAX) 17 g packet Take 17 g by mouth daily as needed.   rOPINIRole (REQUIP) 0.25 MG tablet Take 0.25 mg by mouth at bedtime.   No facility-administered encounter medications on file as of 09/30/2021.     SIGNIFICANT DIAGNOSTIC EXAMS   PREVIOUS   05-12-21: right lower extremity venous doppler: preliminary report: clot from groin to foot.   05-12-21: right lower extremity venous doppler (From ED):   1. Extensive acute mostly occlusive deep venous thrombosis extending from the right common femoral vein into the calf veins.  NO NEW EXAMS.    LABS REVIEWED PREVIOUS:   10-29-20: glucose 103; bun 39; creat 1.64; k+ 4.0; na++ 140; ca 8.5  01-07-21: wbc 7.8; hgb 15.3; hct 47.7; mcv 93.7 plt 180; glucose 101; bun 45; creat 1.85; k+ 4.4; na++ 140; ca 8.6 GFR 37; liver normal albumin 2.8 chol 101; ldl 60; trig 77; hdl 26; hgb a1c 6.5; tsh 1.908 02-15-21: urine culture: multiple species  03-25-21 hgb a1c 6.6 05-11-21: urine for micro albumin 1299.7 (413.2) on ARB   05-12-21: wbc 10.6; hgb `15.2; hct 47.9; mcv 95.0 plt 181; glucose 233; bun 49; creat 1.98; k+ 4.4; na++ 137; ca 8.2; GFR34 05-14-21: glucose 116; bun 32; creat 1.68; k+ 4.1; na++ 139; ca 8.2; GFR 42 05-15-21: wbc 7.5; hgb 14.7; hct 44.6; mcv 89.7 plt 182  05-21-21: wbc 8.6; hgb 13.6; hct 42.9; mcv 93.5 plt 209;  glucose 147; bun 35; creat 1.75; k+ 4.0 na++ 8.1; GFR 40 07-23-21: wbc 7.4; hgb 15.8; hct 50.3; mcv 94.7 plt 189; urine culture: enterococcus faecalis/providencia stuartii  08-09-21: wbc 8.3; hgb 14.5; hct 45.3; mcv 93.8 plt 192; glucose 127; bun 34; creat 1.83; k+ 4.1; na++ 140; ca 7.8; GFR 38; alk phos 130; albumin 2.6 urine culture: pseudomonas aeruginosa: cipro  TODAY  09-09-21: hgb a1c 6.5; chol 95; ldl 58; trig 75; hdl 22; PSA 0.37 09-13-21: wbc 7.4; hgb 15.3; hct 48.5; mcv 94.0 plt 180; glucose 247; bun 43; creat 2.11; k+ 4.7; na++ 139; ca 8.3; GFR 32; d-dimer <0.27; CRP 2.34 09-29-21: wbc 8.0; hgb 14.9; hct 46.9; mcv 94.4 plt 170; glucose 168; bun 50; creat 2.04; k+ 4.5; na++ 134; ca 7.9; GFR 33; urine culture: no growth   Review of Systems  Constitutional:  Negative for malaise/fatigue.  Respiratory:  Negative for cough.   Cardiovascular:  Negative for chest pain.  Gastrointestinal:  Negative for abdominal pain.  Musculoskeletal:  Negative for back pain, joint pain and myalgias.  Skin: Negative.   Neurological:  Negative for dizziness.  Psychiatric/Behavioral:  The patient is not nervous/anxious.    Physical Exam Constitutional:      General: He is not in acute distress.    Appearance: He is well-developed. He is not diaphoretic.  Neck:     Thyroid: No thyromegaly.  Cardiovascular:     Rate and Rhythm: Normal rate and regular rhythm.     Pulses: Normal pulses.     Heart sounds: Normal heart sounds.  Pulmonary:     Effort: Pulmonary effort is normal. No respiratory distress.     Breath sounds: Normal breath sounds.  Abdominal:     General: Bowel sounds are normal. There is no distension.     Palpations: Abdomen is soft.     Tenderness: There is no abdominal tenderness.  Genitourinary:  Comments: Foley urine clear  Musculoskeletal:     Cervical back: Neck supple.     Right lower leg: No edema.     Left lower leg: No edema.     Comments:  Right hemiplegia     Lymphadenopathy:     Cervical: No cervical adenopathy.  Skin:    General: Skin is warm and dry.  Neurological:     Mental Status: He is alert. Mental status is at baseline.  Psychiatric:        Mood and Affect: Mood normal.     ASSESSMENT/ PLAN:  TODAY  Gross hematuria: is improved; urine clear; will follow up with urology. Will stop cipro as his culture is negative for growth.    Ok Edwards NP [facility]  Contact [phone] Monday through Friday 8am- 5pm  After hours call [phone]

## 2021-10-03 LAB — URINE CULTURE: Culture: 40000 — AB

## 2021-10-04 ENCOUNTER — Telehealth: Payer: Self-pay | Admitting: *Deleted

## 2021-10-04 NOTE — Telephone Encounter (Signed)
Post ED Visit - Positive Culture Follow-up  Culture report reviewed by antimicrobial stewardship pharmacist: [place] Team []  Elenor Quinones, Pharm.D. []  Heide Guile, Pharm.D., BCPS AQ-ID []  Parks Neptune, Pharm.D., BCPS []  Alycia Rossetti, Pharm.D., BCPS []  [place], Pharm.D., BCPS, AAHIVP []  Legrand Como, Pharm.D., BCPS, AAHIVP []  Salome Arnt, PharmD, BCPS []  Johnnette Gourd, PharmD, BCPS []  Hughes Better, PharmD, BCPS []  Leeroy Cha, PharmD []  Laqueta Linden, PharmD, BCPS []  Albertina Parr, PharmD  [place] Team []  Leodis Sias, PharmD []  Lindell Spar, PharmD []  Royetta Asal, PharmD []  Graylin Shiver, Rph []  Rema Fendt) Glennon Mac, PharmD []  Arlyn Dunning, PharmD []  Netta Cedars, PharmD []  Dia Sitter, PharmD []  Leone Haven, PharmD []  Gretta Arab, PharmD []  Theodis Shove, PharmD []  Peggyann Juba, PharmD []  Reuel Boom, PharmD   Positive urine culture D/C Cipro and no further patient follow-up is required at this time. Nuala Alpha, PA-C  [place] 10/04/2021, 10:07 AM

## 2021-10-28 ENCOUNTER — Encounter: Payer: Self-pay | Admitting: Adult Health

## 2021-10-28 ENCOUNTER — Non-Acute Institutional Stay (SKILLED_NURSING_FACILITY): Payer: Medicare PPO | Admitting: Adult Health

## 2021-10-28 DIAGNOSIS — I610 Nontraumatic intracerebral hemorrhage in hemisphere, subcortical: Secondary | ICD-10-CM | POA: Diagnosis not present

## 2021-10-28 DIAGNOSIS — I2510 Atherosclerotic heart disease of native coronary artery without angina pectoris: Secondary | ICD-10-CM | POA: Diagnosis not present

## 2021-10-28 DIAGNOSIS — E1151 Type 2 diabetes mellitus with diabetic peripheral angiopathy without gangrene: Secondary | ICD-10-CM | POA: Diagnosis not present

## 2021-10-28 DIAGNOSIS — G8111 Spastic hemiplegia affecting right dominant side: Secondary | ICD-10-CM | POA: Diagnosis not present

## 2021-10-28 DIAGNOSIS — E43 Unspecified severe protein-calorie malnutrition: Secondary | ICD-10-CM

## 2021-10-28 NOTE — [place] Notes (Signed)
Location:  [place] Room Number: 130 Place of Service:  SNF (31)   CODE STATUS: dnr   No Known Allergies  Chief Complaint  Patient presents with   Medical Management of Chronic Issues              Type 2 diabetes mellitus with peripheral vascular disease:  Protein calorie malnutrition severe:   Nontraumatic subcortical hemorrhage of left cerebral hemisphere. Right spastic hemiplegia:    HPI:  He is a 75 year old long term resident of this facility being seen for the management of his chronic illnesses:  Type 2 diabetes mellitus with peripheral vascular disease:  Protein calorie malnutrition severe:   Nontraumatic subcortical hemorrhage of left cerebral hemisphere. Right spastic hemiplegia. There are no reports of uncontrolled pain. His appetite is good; his weight is stable. He does get out of bed daily.   Past Medical History:  Diagnosis Date   A-fib [facility])    a. Post-op afib after CABG 08/2012.   Acute gastric ulcer with hemorrhage    Acute respiratory failure with hypoxia (HCC)    Aphasia following cerebral infarction    CAD (coronary artery disease)    a. NSTEMI s/p stent to distal RCA 03/2000. b. Inf MI s/p emergent thrombectomy/stenting mid RCA 10/2000. c. NSTEMI s/p CABGx4 (LIMA-LAD, SVG-OM2, seq SVG-acute marginal and PD) 03/16/98 - course complicated by confusion, post-op AF, L pleural effusion with thoracentesis. d. Normal LV function by echo 08/2012.   Diverticulosis    DM with CKD    Dysphagia following cerebral infarction    Encephalopathy    Extended spectrum beta lactamase (ESBL) resistance    Generalized anxiety disorder    GERD (gastroesophageal reflux disease)    Hemiplegia and hemiparesis following cerebral infarction affecting right dominant side (HCC)    Hiatal hernia    HLD (hyperlipidemia)    HTN (hypertension)    Non-ST elevation (NSTEMI) myocardial infarction [facility])    Nontraumatic intracerebral hemorrhage in hemisphere,  subcortical [facility])    Peripheral vascular disease ([place])    a. Carotid dopplers neg 08/13/12. b. Pre-cabg ABIs - R=0.87 suggesting mild dz, L=1.29 possibly falsely elevated due to calcified vessels.   Pleural effusion    a. L pleural eff after CABG s/p thoracentesis 08/22/12.   Prostate cancer [facility])    Status post radiation treatment.   Prostatic hypertrophy    a. Hx of urinary retention, awaiting TURP.   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 Mild  MR by TEE 08/2012.    Past Surgical History:  Procedure Laterality Date   APPENDECTOMY     BACK SURGERY     CORONARY ARTERY BYPASS GRAFT  08/16/2012   Procedure: CORONARY ARTERY BYPASS GRAFTING (CABG);  Surgeon: Melrose Nakayama, MD;  Location: [place];  Service: Open Heart Surgery;  Laterality: N/A;   IR REPLC GASTRO/COLONIC TUBE PERCUT W/FLUORO  02/13/2019   LEFT HEART CATHETERIZATION WITH CORONARY ANGIOGRAM N/A 08/14/2012   Procedure: LEFT HEART CATHETERIZATION WITH CORONARY ANGIOGRAM;  Surgeon: Peter M Martinique, MD;  Location: [facility] CATH LAB;  Service: Cardiovascular;  Laterality: N/A;    Social History   Socioeconomic History   Marital status: Married    Spouse name: Not on file   Number of children: Not on file   Years of education: Not on file  Highest education level: Not on file  Occupational History   Occupation: retired   Tobacco Use   Smoking status: Former    Types: Cigarettes    Quit date: 11/12/1972    Years since quitting: 48.9   Smokeless tobacco: Never  Vaping Use   Vaping Use: Never used  Substance and Sexual Activity   Alcohol use: No   Drug use: No   Sexual activity: Not Currently  Other Topics Concern   Not on file  Social History Narrative   He is a long term patient of [place]    Social Determinants of Health   Financial Resource Strain: Not on file  Food Insecurity: Not on file   Transportation Needs: Not on file  Physical Activity: Not on file  Stress: Not on file  Social Connections: Not on file  Intimate Partner Violence: Not on file   Family History  Problem Relation Age of Onset   Hypertension Mother       VITAL SIGNS BP 124/78   Pulse 70   Temp 97.6 F (36.4 C)   Resp 18   Ht _0  (1.803 m)   Wt 166 lb 12.8 oz (75.7 kg)   SpO2 95%   BMI 23.26 kg/m   Outpatient Encounter Medications as of 10/28/2021  Medication Sig   acetaminophen (TYLENOL) 325 MG tablet Take 650 mg by mouth every 4 (four) hours as needed. per standing order for pain/fever DO NOT EXCEED >3,000 mg in 24 hours   Amino Acids-Protein Hydrolys (FEEDING SUPPLEMENT, PRO-STAT SUGAR FREE 64,) LIQD Take 30 mLs by mouth 3 (three) times daily with meals. for albumin 2.6   amLODipine (NORVASC) 5 MG tablet Take 5 mg by mouth daily.    apixaban (ELIQUIS) 5 MG TABS tablet Take 5 mg by mouth 2 (two) times daily.   atorvastatin (LIPITOR) 40 MG tablet Take 40 mg by mouth at bedtime.    Baclofen 5 MG TABS Take 5 mg by mouth 3 (three) times daily. Nontraumatic intracerebral hemorrhage in hemisphere, subcortical   Balsam [place]-Castor Oil (VENELEX) OINT Apply topically. Special Instructions: Apply to sacrum, coccyx and bilateral buttocks qshift for prevention/ redness. Every Shift Day, Evening, Night   ciprofloxacin (CIPRO) 500 MG tablet Take 1 tablet (500 mg total) by mouth 2 (two) times daily.   ergocalciferol (VITAMIN D2) 1.25 MG (50000 UT) capsule Take 50,000 Units by mouth once a week.   Eyelid Cleansers (OCUSOFT LID SCRUB EX) daily. Cleanse both eye lids   Glucerna (GLUCERNA) LIQD Take 237 mLs by mouth daily.   hydrocortisone cream 1 % Apply 1 application topically as needed (for hemorrhoids).   insulin glargine (LANTUS) 100 UNIT/ML injection Inject 20 Units into the skin daily.   insulin lispro (HUMALOG) 100 UNIT/ML cartridge Inject 10 Units into the skin 3 (three) times daily before meals.  Give 10 units SQ with meals if Blood Sugar >150   lansoprazole (PREVACID SOLUTAB) 30 MG disintegrating tablet Take 30 mg by mouth daily. Give before meals   loperamide (IMODIUM A-D) 2 MG tablet Take 2 mg by mouth as needed for diarrhea or loose stools. May give another dose after each loose stool, up to 8 mg per 24 hours.   loratadine (CLARITIN) 10 MG tablet Take 10 mg by mouth daily.   losartan (COZAAR) 50 MG tablet Take 50 mg by mouth daily.   Melatonin 1 MG TABS Take 2 tablets by mouth at bedtime.    NON FORMULARY Diet Change: Regular, thin liquids  ondansetron (ZOFRAN-ODT) 4 MG disintegrating tablet Take 4 mg by mouth every 6 (six) hours as needed for nausea or vomiting.   oxybutynin (DITROPAN XL) 15 MG 24 hr tablet Take 15 mg by mouth daily.   polyethylene glycol (MIRALAX / GLYCOLAX) 17 g packet Take 17 g by mouth daily as needed.   rOPINIRole (REQUIP) 0.25 MG tablet Take 0.25 mg by mouth at bedtime.   No facility-administered encounter medications on file as of 10/28/2021.     SIGNIFICANT DIAGNOSTIC EXAMS  PREVIOUS   05-12-21: right lower extremity venous doppler: preliminary report: clot from groin to foot.   05-12-21: right lower extremity venous doppler (From ED):   1. Extensive acute mostly occlusive deep venous thrombosis extending from the right common femoral vein into the calf veins.  NO NEW EXAMS.    LABS REVIEWED PREVIOUS:   10-29-20: glucose 103; bun 39; creat 1.64; k+ 4.0; na++ 140; ca 8.5  01-07-21: wbc 7.8; hgb 15.3; hct 47.7; mcv 93.7 plt 180; glucose 101; bun 45; creat 1.85; k+ 4.4; na++ 140; ca 8.6 GFR 37; liver normal albumin 2.8 chol 101; ldl 60; trig 77; hdl 26; hgb a1c 6.5; tsh 1.908 02-15-21: urine culture: multiple species  03-25-21 hgb a1c 6.6 05-11-21: urine for micro albumin 1299.7 (413.2) on ARB   05-12-21: wbc 10.6; hgb `15.2; hct 47.9; mcv 95.0 plt 181; glucose 233; bun 49; creat 1.98; k+ 4.4; na++ 137; ca 8.2; GFR34 05-14-21: glucose 116; bun 32; creat  1.68; k+ 4.1; na++ 139; ca 8.2; GFR 42 05-15-21: wbc 7.5; hgb 14.7; hct 44.6; mcv 89.7 plt 182  05-21-21: wbc 8.6; hgb 13.6; hct 42.9; mcv 93.5 plt 209; glucose 147; bun 35; creat 1.75; k+ 4.0 na++ 8.1; GFR 40 07-23-21: wbc 7.4; hgb 15.8; hct 50.3; mcv 94.7 plt 189; urine culture: enterococcus faecalis/providencia stuartii  08-09-21: wbc 8.3; hgb 14.5; hct 45.3; mcv 93.8 plt 192; glucose 127; bun 34; creat 1.83; k+ 4.1; na++ 140; ca 7.8; GFR 38; alk phos 130; albumin 2.6 urine culture: pseudomonas aeruginosa: cipro 09-09-21: hgb a1c 6.5; chol 95; ldl 58; trig 75; hdl 22; PSA 0.37 09-13-21: wbc 7.4; hgb 15.3; hct 48.5; mcv 94.0 plt 180; glucose 247; bun 43; creat 2.11; k+ 4.7; na++ 139; ca 8.3; GFR 32; d-dimer <0.27; CRP 2.34 09-29-21: wbc 8.0; hgb 14.9; hct 46.9; mcv 94.4 plt 170; glucose 168; bun 50; creat 2.04; k+ 4.5; na++ 134; ca 7.9; GFR 33; urine culture: no growth   NO NEW LABS.   Review of Systems  Constitutional:  Negative for malaise/fatigue.  Respiratory:  Negative for cough and shortness of breath.   Cardiovascular:  Negative for chest pain, palpitations and leg swelling.  Gastrointestinal:  Negative for abdominal pain, constipation and heartburn.  Musculoskeletal:  Negative for back pain, joint pain and myalgias.  Skin: Negative.   Neurological:  Negative for dizziness.  Psychiatric/Behavioral:  The patient is not nervous/anxious.     Physical Exam Constitutional:      General: He is not in acute distress.    Appearance: He is well-developed. He is not diaphoretic.  Neck:     Thyroid: No thyromegaly.  Cardiovascular:     Rate and Rhythm: Normal rate and regular rhythm.     Heart sounds: Normal heart sounds.  Pulmonary:     Effort: Pulmonary effort is normal. No respiratory distress.     Breath sounds: Normal breath sounds.  Abdominal:     General: Bowel sounds are normal. There is no distension.  Palpations: Abdomen is soft.     Tenderness: There is no abdominal  tenderness.  Genitourinary:    Comments: Foley  Musculoskeletal:     Cervical back: Neck supple.     Right lower leg: No edema.     Left lower leg: No edema.     Comments: Right hemiplegia   Lymphadenopathy:     Cervical: No cervical adenopathy.  Skin:    General: Skin is warm and dry.  Neurological:     Mental Status: He is alert. Mental status is at baseline.  Psychiatric:        Mood and Affect: Mood normal.     ASSESSMENT/ PLAN:  TODAY:   Type 2 diabetes mellitus with peripheral vascular disease: is stable hgb a1c 6.7; will continue lantus 20 units nightly humalog 10 units with meals is on statin arb eliquis  2. Protein calorie malnutrition severe: is stable weight is 166 pounds; albumin 2.2 will continue supplements as indicated  3. Nontraumatic subcortical hemorrhage of left cerebral hemisphere. Right spastic hemiplegia: is stable will continue baclofen 10 mg twice daily for spasticity   PREVIOUS   4. Aortic atherosclerosis (ct 01-04-19) will monitor   5. Acute deep vein thrombosis (DVT) of femoral vein of right leg (05-12-21) is stable will continue eliquis 5 mg twice daily   6. CAD native heart without angina: is status post cabg 2012: will continue cozaar 50 mg daily   7. Hypertension associated with type 2 diabetes mellitus: is stable b/p 135/82 will continue norvasc 5 mg daily cozaar 50 mg daily   8 Dyslipidemia associated with type 2 diabetes mellitus: is stable LDL 77 will continue lipitor 40 mg daily   9. Micro-albuminuria due to type 2 diabetes mellitus: is without change 1299.7 is on ARB  10. Major depression with psychotic features: is stable is presently off all medications will monitor   11. CKD stage 3 due to type 2 diabetes mellitus: is stable bun 35; creat 1.72; GFR 40   12. Chronic urine retention/bladder spasms: is stable has long term foley: will continue ditropan xl 15 mg daily    Ok Edwards NP [facility] Adult [facility]  Contact [phone]  Monday through Friday 8am- 5pm  After hours call [phone]

## 2021-10-29 ENCOUNTER — Non-Acute Institutional Stay (SKILLED_NURSING_FACILITY): Payer: Medicare PPO | Admitting: Adult Health

## 2021-10-29 ENCOUNTER — Encounter: Payer: Self-pay | Admitting: Adult Health

## 2021-10-29 DIAGNOSIS — I739 Peripheral vascular disease, unspecified: Secondary | ICD-10-CM | POA: Diagnosis not present

## 2021-10-29 DIAGNOSIS — R809 Proteinuria, unspecified: Secondary | ICD-10-CM

## 2021-10-29 DIAGNOSIS — E1129 Type 2 diabetes mellitus with other diabetic kidney complication: Secondary | ICD-10-CM | POA: Diagnosis not present

## 2021-10-29 DIAGNOSIS — I700 Atherosclerosis of aorta: Secondary | ICD-10-CM

## 2021-10-29 NOTE — Progress Notes (Signed)
Location:  [place] Room Number: 294-T Place of Service:  SNF (31) Provider: Ok Edwards, NP  CODE STATUS: DNR  No Known Allergies  Chief Complaint  Patient presents with   Acute Visit    Care plan meeting.    HPI:  We have come together for his care plan meeting. Family present . BIMS no; mood 1/30: decreased interest in activities. He requires extensive to dependent assist with adls. Has foley is incontinent of bowel. He is nonambulatory gets out of bed daily to [employer]. Dietary:  regular diet; cbg readings are stable; good appetite weight 167 is stable; feeds self after setup.  Therapy none at this time.  He continues to be followed for his chronic illnesses including:   Aortic atherosclerosis  Peripheral vascular disease  Microalbuminuria due to type 2 diabetes mellitus  Past Medical History:  Diagnosis Date   A-fib ([place])    a. Post-op afib after CABG 08/2012.   Acute gastric ulcer with hemorrhage    Acute respiratory failure with hypoxia (HCC)    Aphasia following cerebral infarction    CAD (coronary artery disease)    a. NSTEMI s/p stent to distal RCA 03/2000. b. Inf MI s/p emergent thrombectomy/stenting mid RCA 10/2000. c. NSTEMI s/p CABGx4 (LIMA-LAD, SVG-OM2, seq SVG-acute marginal and PD) 05/16/45 - course complicated by confusion, post-op AF, L pleural effusion with thoracentesis. d. Normal LV function by echo 08/2012.   Diverticulosis    DM with CKD    Dysphagia following cerebral infarction    Encephalopathy    Extended spectrum beta lactamase (ESBL) resistance    Generalized anxiety disorder    GERD (gastroesophageal reflux disease)    Hemiplegia and hemiparesis following cerebral infarction affecting right dominant side (HCC)    Hiatal hernia    HLD (hyperlipidemia)    HTN (hypertension)    Non-ST elevation (NSTEMI) myocardial infarction [facility])    Nontraumatic intracerebral hemorrhage in hemisphere, subcortical [facility])    Peripheral  vascular disease ([place])    a. Carotid dopplers neg 08/13/12. b. Pre-cabg ABIs - R=0.87 suggesting mild dz, L=1.29 possibly falsely elevated due to calcified vessels.   Pleural effusion    a. L pleural eff after CABG s/p thoracentesis 08/22/12.   Prostate cancer [facility])    Status post radiation treatment.   Prostatic hypertrophy    a. Hx of urinary retention, awaiting TURP.   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 Mild  MR by TEE 08/2012.    Past Surgical History:  Procedure Laterality Date   APPENDECTOMY     BACK SURGERY     CORONARY ARTERY BYPASS GRAFT  08/16/2012   Procedure: CORONARY ARTERY BYPASS GRAFTING (CABG);  Surgeon: Melrose Nakayama, MD;  [facility];  Service: Open Heart Surgery;  Laterality: N/A;   IR REPLC GASTRO/COLONIC TUBE PERCUT W/FLUORO  02/13/2019   LEFT HEART CATHETERIZATION WITH CORONARY ANGIOGRAM N/A 08/14/2012   Procedure: LEFT HEART CATHETERIZATION WITH CORONARY ANGIOGRAM;  Surgeon: Peter M Martinique, MD;  Location: [facility] CATH LAB;  Service: Cardiovascular;  Laterality: N/A;    Social History   Socioeconomic History   Marital status: Married    Spouse name: Not on file   Number of children: Not on file   Years of education: Not on file   Highest education level: Not on file  Occupational History   Occupation: retired   Tobacco Use   Smoking status: Former    Types: Cigarettes    Quit date: 11/12/1972    Years since quitting: 48.9   Smokeless tobacco: Never  Vaping Use   Vaping Use: Never used  Substance and Sexual Activity   Alcohol use: No   Drug use: No   Sexual activity: Not Currently  Other Topics Concern   Not on file  Social History Narrative   He is a long term patient of [facility]    Social Determinants of Health   Financial Resource Strain: Not on file  Food Insecurity: Not on file  Transportation Needs: Not on file   Physical Activity: Not on file  Stress: Not on file  Social Connections: Not on file  Intimate Partner Violence: Not on file   Family History  Problem Relation Age of Onset   Hypertension Mother       VITAL SIGNS BP 124/78   Pulse 70   Temp 97.6 F (36.4 C)   Resp 18   Ht _0  (1.803 m)   Wt 166 lb 12.8 oz (75.7 kg)   SpO2 95%   BMI 23.26 kg/m   Outpatient Encounter Medications as of 10/29/2021  Medication Sig   acetaminophen (TYLENOL) 325 MG tablet Take 650 mg by mouth every 4 (four) hours as needed. per standing order for pain/fever DO NOT EXCEED >3,000 mg in 24 hours   Amino Acids-Protein Hydrolys (FEEDING SUPPLEMENT, PRO-STAT SUGAR FREE 64,) LIQD Take 30 mLs by mouth 3 (three) times daily with meals. for albumin 2.6   amLODipine (NORVASC) 5 MG tablet Take 5 mg by mouth daily.    apixaban (ELIQUIS) 5 MG TABS tablet Take 5 mg by mouth 2 (two) times daily.   atorvastatin (LIPITOR) 40 MG tablet Take 40 mg by mouth at bedtime.    Baclofen 5 MG TABS Take 5 mg by mouth 3 (three) times daily. Nontraumatic intracerebral hemorrhage in hemisphere, subcortical   Balsam [place]-Castor Oil (VENELEX) OINT Apply topically. Special Instructions: Apply to sacrum, coccyx and bilateral buttocks qshift for prevention/ redness. Every Shift Day, Evening, Night   Eyelid Cleansers (OCUSOFT LID SCRUB EX) daily. Cleanse both eye lids   Glucerna (GLUCERNA) LIQD Take 237 mLs by mouth daily.   hydrocortisone cream 1 % Apply 1 application topically as needed (for hemorrhoids).   insulin glargine (LANTUS) 100 UNIT/ML injection Inject 20 Units into the skin daily.   insulin lispro (HUMALOG) 100 UNIT/ML cartridge Inject 10 Units into the skin 3 (three) times daily before meals. Give 10 units SQ with meals if Blood Sugar >150   lansoprazole (PREVACID SOLUTAB) 30 MG disintegrating tablet Take 30 mg by mouth daily. Give before meals   loperamide (IMODIUM A-D) 2 MG tablet Take 2 mg by mouth as needed for  diarrhea or loose stools. May give another dose after each loose stool, up to 8 mg per 24 hours.   loratadine (CLARITIN) 10 MG tablet Take 10 mg by mouth daily.   losartan (COZAAR) 50 MG tablet Take 50 mg by mouth daily.   Melatonin 1 MG TABS Take 2 tablets by mouth at bedtime.    ondansetron (ZOFRAN-ODT) 4 MG disintegrating tablet Take 4 mg by mouth every 6 (six) hours as needed for nausea or vomiting.   oxybutynin (DITROPAN XL) 15 MG 24 hr tablet Take 15 mg by mouth daily.   polyethylene glycol (MIRALAX / GLYCOLAX) 17 g packet Take 17 g by mouth  daily as needed.   rOPINIRole (REQUIP) 0.25 MG tablet Take 0.25 mg by mouth at bedtime.   NON FORMULARY Diet Change: Regular, thin liquids   [DISCONTINUED] ciprofloxacin (CIPRO) 500 MG tablet Take 1 tablet (500 mg total) by mouth 2 (two) times daily.   [DISCONTINUED] ergocalciferol (VITAMIN D2) 1.25 MG (50000 UT) capsule Take 50,000 Units by mouth once a week.   No facility-administered encounter medications on file as of 10/29/2021.     SIGNIFICANT DIAGNOSTIC EXAMS  PREVIOUS   05-12-21: right lower extremity venous doppler: preliminary report: clot from groin to foot.   05-12-21: right lower extremity venous doppler (From ED):   1. Extensive acute mostly occlusive deep venous thrombosis extending from the right common femoral vein into the calf veins.  NO NEW EXAMS.    LABS REVIEWED PREVIOUS:   10-29-20: glucose 103; bun 39; creat 1.64; k+ 4.0; na++ 140; ca 8.5  01-07-21: wbc 7.8; hgb 15.3; hct 47.7; mcv 93.7 plt 180; glucose 101; bun 45; creat 1.85; k+ 4.4; na++ 140; ca 8.6 GFR 37; liver normal albumin 2.8 chol 101; ldl 60; trig 77; hdl 26; hgb a1c 6.5; tsh 1.908 02-15-21: urine culture: multiple species  03-25-21 hgb a1c 6.6 05-11-21: urine for micro albumin 1299.7 (413.2) on ARB   05-12-21: wbc 10.6; hgb `15.2; hct 47.9; mcv 95.0 plt 181; glucose 233; bun 49; creat 1.98; k+ 4.4; na++ 137; ca 8.2; GFR34 05-14-21: glucose 116; bun 32; creat 1.68;  k+ 4.1; na++ 139; ca 8.2; GFR 42 05-15-21: wbc 7.5; hgb 14.7; hct 44.6; mcv 89.7 plt 182  05-21-21: wbc 8.6; hgb 13.6; hct 42.9; mcv 93.5 plt 209; glucose 147; bun 35; creat 1.75; k+ 4.0 na++ 8.1; GFR 40 07-23-21: wbc 7.4; hgb 15.8; hct 50.3; mcv 94.7 plt 189; urine culture: enterococcus faecalis/providencia stuartii  08-09-21: wbc 8.3; hgb 14.5; hct 45.3; mcv 93.8 plt 192; glucose 127; bun 34; creat 1.83; k+ 4.1; na++ 140; ca 7.8; GFR 38; alk phos 130; albumin 2.6 urine culture: pseudomonas aeruginosa: cipro 09-09-21: hgb a1c 6.5; chol 95; ldl 58; trig 75; hdl 22; PSA 0.37 09-13-21: wbc 7.4; hgb 15.3; hct 48.5; mcv 94.0 plt 180; glucose 247; bun 43; creat 2.11; k+ 4.7; na++ 139; ca 8.3; GFR 32; d-dimer <0.27; CRP 2.34 09-29-21: wbc 8.0; hgb 14.9; hct 46.9; mcv 94.4 plt 170; glucose 168; bun 50; creat 2.04; k+ 4.5; na++ 134; ca 7.9; GFR 33; urine culture: no growth   NO NEW LABS.   Review of Systems  Constitutional:  Negative for malaise/fatigue.  Respiratory:  Negative for cough and shortness of breath.   Cardiovascular:  Negative for chest pain, palpitations and leg swelling.  Gastrointestinal:  Negative for abdominal pain, constipation and heartburn.  Musculoskeletal:  Negative for back pain, joint pain and myalgias.  Skin: Negative.   Neurological:  Negative for dizziness.  Psychiatric/Behavioral:  The patient is not nervous/anxious.     Physical Exam Constitutional:      General: He is not in acute distress.    Appearance: He is well-developed. He is not diaphoretic.  Neck:     Thyroid: No thyromegaly.  Cardiovascular:     Rate and Rhythm: Normal rate and regular rhythm.     Pulses: Normal pulses.     Heart sounds: Normal heart sounds.  Pulmonary:     Effort: Pulmonary effort is normal. No respiratory distress.     Breath sounds: Normal breath sounds.  Abdominal:     General: Bowel sounds are normal. There  is no distension.     Palpations: Abdomen is soft.     Tenderness: There is  no abdominal tenderness.  Genitourinary:    Comments: Foley  Musculoskeletal:     Cervical back: Neck supple.     Right lower leg: No edema.     Left lower leg: No edema.     Comments: Right hemiplegia     Lymphadenopathy:     Cervical: No cervical adenopathy.  Skin:    General: Skin is warm and dry.  Neurological:     Mental Status: He is alert. Mental status is at baseline.  Psychiatric:        Mood and Affect: Mood normal.     ASSESSMENT/ PLAN:  TODAY  Aortic atherosclerosis Peripheral vascular disease Microalbuminuria due to type 2 diabetes mellitus  Will continue current medications Will continue current plan of care Will continue to monitor his status.    Time spent with patient: 40 minutes: medications; plan of care.    Ok Edwards NP [facility]  Contact [phone] Monday through Friday 8am- 5pm  After hours call [phone]

## 2021-11-25 ENCOUNTER — Encounter: Payer: Self-pay | Admitting: Adult Health

## 2021-11-25 ENCOUNTER — Non-Acute Institutional Stay (SKILLED_NURSING_FACILITY): Payer: Medicare PPO | Admitting: Adult Health

## 2021-11-25 DIAGNOSIS — I2510 Atherosclerotic heart disease of native coronary artery without angina pectoris: Secondary | ICD-10-CM | POA: Diagnosis not present

## 2021-11-25 DIAGNOSIS — E1159 Type 2 diabetes mellitus with other circulatory complications: Secondary | ICD-10-CM

## 2021-11-25 DIAGNOSIS — I82491 Acute embolism and thrombosis of other specified deep vein of right lower extremity: Secondary | ICD-10-CM

## 2021-11-25 DIAGNOSIS — I700 Atherosclerosis of aorta: Secondary | ICD-10-CM | POA: Diagnosis not present

## 2021-11-25 DIAGNOSIS — I152 Hypertension secondary to endocrine disorders: Secondary | ICD-10-CM

## 2021-11-25 NOTE — Progress Notes (Signed)
Location:  [place] Room Number: 810-F Place of Service:  SNF (31) Provider: Ok Edwards, NP  CODE STATUS: DNR  No Known Allergies  Chief Complaint  Patient presents with   Medical Management of Chronic Issues             Aortic atherosclerosis  Acute deep vein thrombosis (DVT) of femoral vein of right leg   CAD native heart without angina:  Hypertension associated with type 2 diabetes mellitus:    HPI:  He is a 75 year old long term resident of this facility being seen for the management of his chronic illnesses:  Aortic atherosclerosis  Acute deep vein thrombosis (DVT) of femoral vein of right leg   CAD native heart without angina:  Hypertension associated with type 2 diabetes mellitus. There are no reports of uncontrolled pain. Appetite is good; weight is stable.   Past Medical History:  Diagnosis Date   A-fib [facility])    a. Post-op afib after CABG 08/2012.   Acute gastric ulcer with hemorrhage    Acute respiratory failure with hypoxia (HCC)    Aphasia following cerebral infarction    CAD (coronary artery disease)    a. NSTEMI s/p stent to distal RCA 03/2000. b. Inf MI s/p emergent thrombectomy/stenting mid RCA 10/2000. c. NSTEMI s/p CABGx4 (LIMA-LAD, SVG-OM2, seq SVG-acute marginal and PD) 06/15/09 - course complicated by confusion, post-op AF, L pleural effusion with thoracentesis. d. Normal LV function by echo 08/2012.   Diverticulosis    DM with CKD    Dysphagia following cerebral infarction    Encephalopathy    Extended spectrum beta lactamase (ESBL) resistance    Generalized anxiety disorder    GERD (gastroesophageal reflux disease)    Hemiplegia and hemiparesis following cerebral infarction affecting right dominant side (HCC)    Hiatal hernia    HLD (hyperlipidemia)    HTN (hypertension)    Non-ST elevation (NSTEMI) myocardial infarction [facility])    Nontraumatic intracerebral hemorrhage in hemisphere, subcortical [facility])    Peripheral vascular disease  ([place])    a. Carotid dopplers neg 08/13/12. b. Pre-cabg ABIs - R=0.87 suggesting mild dz, L=1.29 possibly falsely elevated due to calcified vessels.   Pleural effusion    a. L pleural eff after CABG s/p thoracentesis 08/22/12.   Prostate cancer [facility])    Status post radiation treatment.   Prostatic hypertrophy    a. Hx of urinary retention, awaiting TURP.   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 Mild  MR by TEE 08/2012.    Past Surgical History:  Procedure Laterality Date   APPENDECTOMY     BACK SURGERY     CORONARY ARTERY BYPASS GRAFT  08/16/2012   Procedure: CORONARY ARTERY BYPASS GRAFTING (CABG);  Surgeon: Melrose Nakayama, MD;  Location: [place];  Service: Open Heart Surgery;  Laterality: N/A;   IR REPLC GASTRO/COLONIC TUBE PERCUT W/FLUORO  02/13/2019   LEFT HEART CATHETERIZATION WITH CORONARY ANGIOGRAM N/A 08/14/2012   Procedure: LEFT HEART CATHETERIZATION WITH CORONARY ANGIOGRAM;  Surgeon: Peter M Martinique, MD;  Location: [facility] CATH LAB;  Service: Cardiovascular;  Laterality: N/A;    Social History   Socioeconomic History   Marital status: Married    Spouse name: Not on file   Number of children: Not on file   Years of education: Not on file  Highest education level: Not on file  Occupational History   Occupation: retired   Tobacco Use   Smoking status: Former    Types: Cigarettes    Quit date: 11/12/1972    Years since quitting: 49.0   Smokeless tobacco: Never  Vaping Use   Vaping Use: Never used  Substance and Sexual Activity   Alcohol use: No   Drug use: No   Sexual activity: Not Currently  Other Topics Concern   Not on file  Social History Narrative   He is a long term patient of [facility]    Social Determinants of Health   Financial Resource Strain: Not on file  Food Insecurity: Not on file  Transportation Needs: Not on file  Physical Activity:  Not on file  Stress: Not on file  Social Connections: Not on file  Intimate Partner Violence: Not on file   Family History  Problem Relation Age of Onset   Hypertension Mother       VITAL SIGNS BP 130/68    Pulse 78    Temp 97.6 F (36.4 C)    Resp 18    Ht _0  (1.803 m)    Wt 171 lb 6.4 oz (77.7 kg)    SpO2 95%    BMI 23.91 kg/m   Outpatient Encounter Medications as of 11/25/2021  Medication Sig   acetaminophen (TYLENOL) 325 MG tablet Take 650 mg by mouth every 4 (four) hours as needed. per standing order for pain/fever DO NOT EXCEED >3,000 mg in 24 hours   Amino Acids-Protein Hydrolys (FEEDING SUPPLEMENT, PRO-STAT SUGAR FREE 64,) LIQD Take 30 mLs by mouth 3 (three) times daily with meals. for albumin 2.6   amLODipine (NORVASC) 5 MG tablet Take 5 mg by mouth daily.    apixaban (ELIQUIS) 5 MG TABS tablet Take 5 mg by mouth 2 (two) times daily.   atorvastatin (LIPITOR) 40 MG tablet Take 40 mg by mouth daily.   Baclofen 5 MG TABS Take 5 mg by mouth 3 (three) times daily. Nontraumatic intracerebral hemorrhage in hemisphere, subcortical   Balsam [place]-Castor Oil (VENELEX) OINT Apply topically. Special Instructions: Apply to sacrum, coccyx and bilateral buttocks qshift for prevention/ redness. Every Shift Day, Evening, Night   Eyelid Cleansers (OCUSOFT LID SCRUB EX) daily. Cleanse both eye lids   Glucerna (GLUCERNA) LIQD Take 237 mLs by mouth daily.   hydrocortisone cream 1 % Apply 1 application topically as needed (for hemorrhoids).   insulin glargine (LANTUS) 100 UNIT/ML injection Inject 20 Units into the skin daily.   insulin lispro (HUMALOG) 100 UNIT/ML cartridge Inject 10 Units into the skin 3 (three) times daily before meals. Give 10 units SQ with meals if Blood Sugar >150   lansoprazole (PREVACID SOLUTAB) 30 MG disintegrating tablet Take 30 mg by mouth daily. Give before meals   loperamide (IMODIUM A-D) 2 MG tablet Take 2 mg by mouth as needed for diarrhea or loose stools. May  give another dose after each loose stool, up to 8 mg per 24 hours.   loratadine (CLARITIN) 10 MG tablet Take 10 mg by mouth daily.   losartan (COZAAR) 50 MG tablet Take 50 mg by mouth daily.   Melatonin 1 MG TABS Take 2 tablets by mouth at bedtime.    NON FORMULARY Diet Change: Regular, thin liquids   ondansetron (ZOFRAN-ODT) 4 MG disintegrating tablet Take 4 mg by mouth every 6 (six) hours as needed for nausea or vomiting.   oxybutynin (DITROPAN XL) 15 MG 24 hr tablet  Take 15 mg by mouth daily.   polyethylene glycol (MIRALAX / GLYCOLAX) 17 g packet Take 17 g by mouth daily as needed.   rOPINIRole (REQUIP) 0.25 MG tablet Take 0.25 mg by mouth at bedtime.   No facility-administered encounter medications on file as of 11/25/2021.     SIGNIFICANT DIAGNOSTIC EXAMS   PREVIOUS   05-12-21: right lower extremity venous doppler: preliminary report: clot from groin to foot.   05-12-21: right lower extremity venous doppler (From ED):   1. Extensive acute mostly occlusive deep venous thrombosis extending from the right common femoral vein into the calf veins.  NO NEW EXAMS.    LABS REVIEWED PREVIOUS:    01-07-21: wbc 7.8; hgb 15.3; hct 47.7; mcv 93.7 plt 180; glucose 101; bun 45; creat 1.85; k+ 4.4; na++ 140; ca 8.6 GFR 37; liver normal albumin 2.8 chol 101; ldl 60; trig 77; hdl 26; hgb a1c 6.5; tsh 1.908 02-15-21: urine culture: multiple species  03-25-21 hgb a1c 6.6 05-11-21: urine for micro albumin 1299.7 (413.2) on ARB   05-12-21: wbc 10.6; hgb `15.2; hct 47.9; mcv 95.0 plt 181; glucose 233; bun 49; creat 1.98; k+ 4.4; na++ 137; ca 8.2; GFR34 05-14-21: glucose 116; bun 32; creat 1.68; k+ 4.1; na++ 139; ca 8.2; GFR 42 05-15-21: wbc 7.5; hgb 14.7; hct 44.6; mcv 89.7 plt 182  05-21-21: wbc 8.6; hgb 13.6; hct 42.9; mcv 93.5 plt 209; glucose 147; bun 35; creat 1.75; k+ 4.0 na++ 8.1; GFR 40 07-23-21: wbc 7.4; hgb 15.8; hct 50.3; mcv 94.7 plt 189; urine culture: enterococcus faecalis/providencia stuartii   08-09-21: wbc 8.3; hgb 14.5; hct 45.3; mcv 93.8 plt 192; glucose 127; bun 34; creat 1.83; k+ 4.1; na++ 140; ca 7.8; GFR 38; alk phos 130; albumin 2.6 urine culture: pseudomonas aeruginosa: cipro 09-09-21: hgb a1c 6.5; chol 95; ldl 58; trig 75; hdl 22; PSA 0.37 09-13-21: wbc 7.4; hgb 15.3; hct 48.5; mcv 94.0 plt 180; glucose 247; bun 43; creat 2.11; k+ 4.7; na++ 139; ca 8.3; GFR 32; d-dimer <0.27; CRP 2.34 09-29-21: wbc 8.0; hgb 14.9; hct 46.9; mcv 94.4 plt 170; glucose 168; bun 50; creat 2.04; k+ 4.5; na++ 134; ca 7.9; GFR 33; urine culture: no growth   NO NEW LABS.    Review of Systems  Constitutional:  Negative for malaise/fatigue.  Respiratory:  Negative for cough and shortness of breath.   Cardiovascular:  Negative for chest pain, palpitations and leg swelling.  Gastrointestinal:  Negative for abdominal pain, constipation and heartburn.  Musculoskeletal:  Negative for back pain, joint pain and myalgias.  Skin: Negative.   Neurological:  Negative for dizziness.  Psychiatric/Behavioral:  The patient is not nervous/anxious.      Physical Exam Constitutional:      General: He is not in acute distress.    Appearance: He is well-developed. He is not diaphoretic.  Neck:     Thyroid: No thyromegaly.  Cardiovascular:     Rate and Rhythm: Normal rate and regular rhythm.     Pulses: Normal pulses.     Heart sounds: Normal heart sounds.  Pulmonary:     Effort: Pulmonary effort is normal. No respiratory distress.     Breath sounds: Normal breath sounds.  Abdominal:     General: Bowel sounds are normal. There is no distension.     Palpations: Abdomen is soft.     Tenderness: There is no abdominal tenderness.  Genitourinary:    Comments: foley Musculoskeletal:     Cervical back: Neck supple.  Right lower leg: No edema.     Left lower leg: No edema.     Comments: Right hemiplegia   Lymphadenopathy:     Cervical: No cervical adenopathy.  Skin:    General: Skin is warm and dry.   Neurological:     Mental Status: He is alert. Mental status is at baseline.  Psychiatric:        Mood and Affect: Mood normal.    ASSESSMENT/ PLAN:  TODAY:   Aortic atherosclerosis (ct 01-04-19) will monitor   2. Acute deep vein thrombosis (DVT) of femoral vein of right leg (05-12-21) is stable will continue eliquis 5 mg twice daily  3. CAD native heart without angina: is status post CABG 2012. Will continue cozaar 50 mg daily   4. Hypertension associated with type 2 diabetes mellitus: is stable b/p 130/68 will continue norvasc 5 mg daily cozaar 50 mg daily   PREVIOUS   5.  Dyslipidemia associated with type 2 diabetes mellitus: is stable LDL 77 will continue lipitor 40 mg daily   6. Micro-albuminuria due to type 2 diabetes mellitus: is without change 1299.7 is on ARB  7. Major depression with psychotic features: is stable is presently off all medications will monitor   8. CKD stage 3 due to type 2 diabetes mellitus: is stable bun 35; creat 1.72; GFR 40   9. Chronic urine retention/bladder spasms: is stable has long term foley: will continue ditropan xl 15 mg daily   10.Type 2 diabetes mellitus with peripheral vascular disease: is stable hgb a1c 6.7; will continue lantus 20 units nightly humalog 10 units with meals is on statin arb eliquis  11. Protein calorie malnutrition severe: is stable weight is 171 pounds; albumin 2.2 will continue supplements as indicated  12. Nontraumatic subcortical hemorrhage of left cerebral hemisphere. Right spastic hemiplegia: is stable will continue baclofen 10 mg twice daily for spasticity     Ok Edwards NP [facility]   call [phone]

## 2021-11-29 NOTE — Progress Notes (Signed)
History of Present Illness: Patient has had urinary retention since his CVA in 2019.  He has an indwelling catheter that is changed every month at the [facility].  Went to the emergency room in October, on the 19th with gross hematuria.  Catheter draining well.  Urine culture grew MRSA.  Prior to culture result the patient was placed on Cipro.  I do not know if he was placed on a different medication following culture result.  He is on Eliquis.  He is also on oxybutynin 15 mg daily.  He still has occasional leakage around the catheter.  Past Medical History:  Diagnosis Date   A-fib [facility])    a. Post-op afib after CABG 08/2012.   Acute gastric ulcer with hemorrhage    Acute respiratory failure with hypoxia (HCC)    Aphasia following cerebral infarction    CAD (coronary artery disease)    a. NSTEMI s/p stent to distal RCA 03/2000. b. Inf MI s/p emergent thrombectomy/stenting mid RCA 10/2000. c. NSTEMI s/p CABGx4 (LIMA-LAD, SVG-OM2, seq SVG-acute marginal and PD) 0/1/02 - course complicated by confusion, post-op AF, L pleural effusion with thoracentesis. d. Normal LV function by echo 08/2012.   Diverticulosis    DM with CKD    Dysphagia following cerebral infarction    Encephalopathy    Extended spectrum beta lactamase (ESBL) resistance    Generalized anxiety disorder    GERD (gastroesophageal reflux disease)    Hemiplegia and hemiparesis following cerebral infarction affecting right dominant side (HCC)    Hiatal hernia    HLD (hyperlipidemia)    HTN (hypertension)    Non-ST elevation (NSTEMI) myocardial infarction [facility])    Nontraumatic intracerebral hemorrhage in hemisphere, subcortical [facility])    Peripheral vascular disease ([place])    a. Carotid dopplers neg 08/13/12. b. Pre-cabg ABIs - R=0.87 suggesting mild dz, L=1.29 possibly falsely elevated due to calcified vessels.   Pleural effusion    a. L pleural eff after CABG s/p thoracentesis 08/22/12.   Prostate cancer [facility])    Status post  radiation treatment.   Prostatic hypertrophy    a. Hx of urinary retention, awaiting TURP.   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 Mild  MR by TEE 08/2012.    Past Surgical History:  Procedure Laterality Date   APPENDECTOMY     BACK SURGERY     CORONARY ARTERY BYPASS GRAFT  08/16/2012   Procedure: CORONARY ARTERY BYPASS GRAFTING (CABG);  Surgeon: Melrose Nakayama, MD;  Location: [place];  Service: Open Heart Surgery;  Laterality: N/A;   IR REPLC GASTRO/COLONIC TUBE PERCUT W/FLUORO  02/13/2019   LEFT HEART CATHETERIZATION WITH CORONARY ANGIOGRAM N/A 08/14/2012   Procedure: LEFT HEART CATHETERIZATION WITH CORONARY ANGIOGRAM;  Surgeon: Peter M Martinique, MD;  Location: [facility] CATH LAB;  Service: Cardiovascular;  Laterality: N/A;    Home Medications:  Allergies as of 11/30/2021   No Known Allergies      Medication List      Notice   This visit is during an admission. Changes to the med list made in this visit will be reflected in the After Visit Summary of the admission.     Allergies: No Known Allergies  Family History  Problem Relation Age of Onset   Hypertension Mother     Social History:  reports that he quit smoking about  49 years ago. His smoking use included cigarettes. He has never used smokeless tobacco. He reports that he does not drink alcohol and does not use drugs.  ROS: A complete review of systems was performed.  All systems are negative except for pertinent findings as noted.  Physical Exam:  Vital signs in last 24 hours: There were no vitals taken for this visit. Constitutional:  Alert and oriented, No acute distress.  Patient in reclining wheelchair. Cardiovascular: Regular rate  Respiratory: Normal respiratory effort Lymphatic: No lymphadenopathy Neurologic: Grossly intact, no focal deficits Psychiatric: Normal mood and  affect  Urine fairly clear in bag.  I have reviewed prior pt notes  I have reviewed notes from emergency room visit  I have reviewed urinalysis results  I have reviewed prior urine culture   Impression/Assessment:  Urinary retention, with catheter in place.  Status post CVA  Recent urinary tract infection, treated  Plan:  1.  I would continue oxybutynin 15 mg daily  2.  Continue catheter changes monthly  3.  Return in 1 year, before then if recurring problems.

## 2021-11-30 ENCOUNTER — Ambulatory Visit (INDEPENDENT_AMBULATORY_CARE_PROVIDER_SITE_OTHER): Payer: Medicare PPO | Admitting: Urology

## 2021-11-30 ENCOUNTER — Encounter: Payer: Self-pay | Admitting: Urology

## 2021-11-30 VITALS — BP 131/81 | HR 73

## 2021-11-30 DIAGNOSIS — N401 Enlarged prostate with lower urinary tract symptoms: Secondary | ICD-10-CM

## 2021-11-30 DIAGNOSIS — R339 Retention of urine, unspecified: Secondary | ICD-10-CM

## 2021-11-30 DIAGNOSIS — N3289 Other specified disorders of bladder: Secondary | ICD-10-CM | POA: Diagnosis not present

## 2021-11-30 NOTE — Progress Notes (Signed)
Urological Symptom Review  Patient is experiencing the following symptoms: Blood in urine   Review of Systems  Gastrointestinal (upper)  : Negative for upper GI symptoms  Gastrointestinal (lower) : Negative for lower GI symptoms  Constitutional : Negative for symptoms  Skin: Negative for skin symptoms  Eyes: Negative for eye symptoms  Ear/Nose/Throat : Negative for Ear/Nose/Throat symptoms  Hematologic/Lymphatic: Negative for Hematologic/Lymphatic symptoms  Cardiovascular : Negative for cardiovascular symptoms  Respiratory : Negative for respiratory symptoms  Endocrine: Negative for endocrine symptoms  Musculoskeletal: Negative for musculoskeletal symptoms  Neurological: Negative for neurological symptoms  Psychologic: Negative for psychiatric symptoms  

## 2021-12-07 ENCOUNTER — Other Ambulatory Visit (HOSPITAL_COMMUNITY)
Admission: RE | Admit: 2021-12-07 | Discharge: 2021-12-07 | Disposition: A | Discharge status: Home / Self Care | Payer: Medicare PPO | Source: Skilled Nursing Facility | Attending: Adult Health | Admitting: Adult Health

## 2021-12-07 ENCOUNTER — Telehealth: Payer: Self-pay

## 2021-12-07 DIAGNOSIS — N390 Urinary tract infection, site not specified: Secondary | ICD-10-CM | POA: Diagnosis present

## 2021-12-07 LAB — URINALYSIS, COMPLETE (UACMP) WITH MICROSCOPIC
Bilirubin Urine: NEGATIVE
Glucose, UA: NEGATIVE mg/dL
Ketones, ur: NEGATIVE mg/dL
Nitrite: POSITIVE — AB
Protein, ur: 100 mg/dL — AB
Specific Gravity, Urine: 1.025 (ref 1.005–1.030)
WBC, UA: >50 WBC/hpf (ref 0–5)
pH: 5.5 (ref 5.0–8.0)

## 2021-12-07 NOTE — Telephone Encounter (Signed)
Nurse from [facility], [place], called about patient catheter causing penile erosion. Reviewed this with Dr. Diona Fanti who stated this can occur in males with urinary catheters and to make sure the catheter tube does not have tension on it.  Reviewed this with Asa Lente who stated catheter is anchored down. Instructed Asa Lente to make sure anchor is not causing tension on penis and reposition so that it is not pulling down causing erosion further. Asa Lente voiced understanding.

## 2021-12-08 ENCOUNTER — Encounter: Payer: Self-pay | Admitting: Adult Health

## 2021-12-08 ENCOUNTER — Non-Acute Institutional Stay (SKILLED_NURSING_FACILITY): Payer: Medicare PPO | Admitting: Adult Health

## 2021-12-08 DIAGNOSIS — R339 Retention of urine, unspecified: Secondary | ICD-10-CM | POA: Diagnosis not present

## 2021-12-08 DIAGNOSIS — S3093XA Unspecified superficial injury of penis, initial encounter: Secondary | ICD-10-CM

## 2021-12-08 LAB — URINE CULTURE

## 2021-12-08 NOTE — Progress Notes (Signed)
Location:  [place] Room Number: 701-X Place of Service:  SNF (31)   CODE STATUS: DNR  No Known Allergies  Chief Complaint  Patient presents with   Acute Visit    Penile injury     HPI:  Staff is concerned about an area on the shaft of his penis that looks like a "fistula". He has been in to see urology without any indication that there was a problem. He does not indicate that there is any pain. There are no reports of urine leaking out of the area.   Past Medical History:  Diagnosis Date   A-fib [facility])    a. Post-op afib after CABG 08/2012.   Acute gastric ulcer with hemorrhage    Acute respiratory failure with hypoxia (HCC)    Aphasia following cerebral infarction    CAD (coronary artery disease)    a. NSTEMI s/p stent to distal RCA 03/2000. b. Inf MI s/p emergent thrombectomy/stenting mid RCA 10/2000. c. NSTEMI s/p CABGx4 (LIMA-LAD, SVG-OM2, seq SVG-acute marginal and PD) 06/19/38 - course complicated by confusion, post-op AF, L pleural effusion with thoracentesis. d. Normal LV function by echo 08/2012.   Diverticulosis    DM with CKD    Dysphagia following cerebral infarction    Encephalopathy    Extended spectrum beta lactamase (ESBL) resistance    Generalized anxiety disorder    GERD (gastroesophageal reflux disease)    Hemiplegia and hemiparesis following cerebral infarction affecting right dominant side (HCC)    Hiatal hernia    HLD (hyperlipidemia)    HTN (hypertension)    Non-ST elevation (NSTEMI) myocardial infarction [facility])    Nontraumatic intracerebral hemorrhage in hemisphere, subcortical [facility])    Peripheral vascular disease ([place])    a. Carotid dopplers neg 08/13/12. b. Pre-cabg ABIs - R=0.87 suggesting mild dz, L=1.29 possibly falsely elevated due to calcified vessels.   Pleural effusion    a. L pleural eff after CABG s/p thoracentesis 08/22/12.   Prostate cancer [facility])    Status post radiation treatment.   Prostatic hypertrophy    a. Hx  of urinary retention, awaiting TURP.   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 Mild  MR by TEE 08/2012.    Past Surgical History:  Procedure Laterality Date   APPENDECTOMY     BACK SURGERY     CORONARY ARTERY BYPASS GRAFT  08/16/2012   Procedure: CORONARY ARTERY BYPASS GRAFTING (CABG);  Surgeon: Melrose Nakayama, MD;  Location: [place];  Service: Open Heart Surgery;  Laterality: N/A;   IR REPLC GASTRO/COLONIC TUBE PERCUT W/FLUORO  02/13/2019   LEFT HEART CATHETERIZATION WITH CORONARY ANGIOGRAM N/A 08/14/2012   Procedure: LEFT HEART CATHETERIZATION WITH CORONARY ANGIOGRAM;  Surgeon: Peter M Martinique, MD;  Location: [facility] CATH LAB;  Service: Cardiovascular;  Laterality: N/A;    Social History   Socioeconomic History   Marital status: Married    Spouse name: Not on file   Number of children: Not on file   Years of education: Not on file   Highest education level: Not on file  Occupational History   Occupation: retired   Tobacco Use   Smoking status: Former    Types: Cigarettes    Quit date: 11/12/1972    Years since quitting: 49.1   Smokeless tobacco: Never  Vaping Use   Vaping Use:  Never used  Substance and Sexual Activity   Alcohol use: No   Drug use: No   Sexual activity: Not Currently  Other Topics Concern   Not on file  Social History Narrative   He is a long term patient of [facility]    Social Determinants of Health   Financial Resource Strain: Not on file  Food Insecurity: Not on file  Transportation Needs: Not on file  Physical Activity: Not on file  Stress: Not on file  Social Connections: Not on file  Intimate Partner Violence: Not on file   Family History  Problem Relation Age of Onset   Hypertension Mother       VITAL SIGNS BP 128/74    Pulse 74    Temp 97.6 F (36.4 C)    Resp 18    Ht 5' 11"  (1.803 m)    Wt 171 lb 6.4 oz (77.7 kg)     SpO2 95%    BMI 23.91 kg/m   Outpatient Encounter Medications as of 12/08/2021  Medication Sig   acetaminophen (TYLENOL) 325 MG tablet Take 650 mg by mouth every 4 (four) hours as needed. per standing order for pain/fever DO NOT EXCEED >3,000 mg in 24 hours   Amino Acids-Protein Hydrolys (FEEDING SUPPLEMENT, PRO-STAT SUGAR FREE 64,) LIQD Take 30 mLs by mouth 3 (three) times daily with meals. for albumin 2.6   amLODipine (NORVASC) 5 MG tablet Take 5 mg by mouth daily.    apixaban (ELIQUIS) 5 MG TABS tablet Take 5 mg by mouth 2 (two) times daily.   atorvastatin (LIPITOR) 40 MG tablet Take 40 mg by mouth daily.   Baclofen 5 MG TABS Take 5 mg by mouth 3 (three) times daily. Nontraumatic intracerebral hemorrhage in hemisphere, subcortical   Balsam [place]-Castor Oil (VENELEX) OINT Apply topically. Special Instructions: Apply to sacrum, coccyx and bilateral buttocks qshift for prevention/ redness. Every Shift Day, Evening, Night   Eyelid Cleansers (OCUSOFT LID SCRUB EX) daily. Cleanse both eye lids   Glucerna (GLUCERNA) LIQD Take 237 mLs by mouth daily.   hydrocortisone cream 1 % Apply 1 application topically as needed (for hemorrhoids).   insulin glargine (LANTUS) 100 UNIT/ML injection Inject 20 Units into the skin daily.   insulin lispro (HUMALOG) 100 UNIT/ML cartridge Inject 10 Units into the skin 3 (three) times daily before meals. Give 10 units SQ with meals if Blood Sugar >150   lansoprazole (PREVACID SOLUTAB) 30 MG disintegrating tablet Take 30 mg by mouth daily. Give before meals   loperamide (IMODIUM A-D) 2 MG tablet Take 2 mg by mouth as needed for diarrhea or loose stools. May give another dose after each loose stool, up to 8 mg per 24 hours.   loratadine (CLARITIN) 10 MG tablet Take 10 mg by mouth daily.   losartan (COZAAR) 50 MG tablet Take 50 mg by mouth daily.   Melatonin 1 MG TABS Take 2 tablets by mouth at bedtime.    NON FORMULARY Diet Change: Regular, thin liquids   ondansetron  (ZOFRAN-ODT) 4 MG disintegrating tablet Take 4 mg by mouth every 6 (six) hours as needed for nausea or vomiting.   oxybutynin (DITROPAN XL) 15 MG 24 hr tablet Take 15 mg by mouth daily.   polyethylene glycol (MIRALAX / GLYCOLAX) 17 g packet Take 17 g by mouth daily as needed.   rOPINIRole (REQUIP) 0.25 MG tablet Take 0.25 mg by mouth at bedtime.   No facility-administered encounter medications on file as of 12/08/2021.  SIGNIFICANT DIAGNOSTIC EXAMS   PREVIOUS   05-12-21: right lower extremity venous doppler: preliminary report: clot from groin to foot.   05-12-21: right lower extremity venous doppler (From ED):   1. Extensive acute mostly occlusive deep venous thrombosis extending from the right common femoral vein into the calf veins.  NO NEW EXAMS.    LABS REVIEWED PREVIOUS:    01-07-21: wbc 7.8; hgb 15.3; hct 47.7; mcv 93.7 plt 180; glucose 101; bun 45; creat 1.85; k+ 4.4; na++ 140; ca 8.6 GFR 37; liver normal albumin 2.8 chol 101; ldl 60; trig 77; hdl 26; hgb a1c 6.5; tsh 1.908 02-15-21: urine culture: multiple species  03-25-21 hgb a1c 6.6 05-11-21: urine for micro albumin 1299.7 (413.2) on ARB   05-12-21: wbc 10.6; hgb `15.2; hct 47.9; mcv 95.0 plt 181; glucose 233; bun 49; creat 1.98; k+ 4.4; na++ 137; ca 8.2; GFR34 05-14-21: glucose 116; bun 32; creat 1.68; k+ 4.1; na++ 139; ca 8.2; GFR 42 05-15-21: wbc 7.5; hgb 14.7; hct 44.6; mcv 89.7 plt 182  05-21-21: wbc 8.6; hgb 13.6; hct 42.9; mcv 93.5 plt 209; glucose 147; bun 35; creat 1.75; k+ 4.0 na++ 8.1; GFR 40 07-23-21: wbc 7.4; hgb 15.8; hct 50.3; mcv 94.7 plt 189; urine culture: enterococcus faecalis/providencia stuartii  08-09-21: wbc 8.3; hgb 14.5; hct 45.3; mcv 93.8 plt 192; glucose 127; bun 34; creat 1.83; k+ 4.1; na++ 140; ca 7.8; GFR 38; alk phos 130; albumin 2.6 urine culture: pseudomonas aeruginosa: cipro 09-09-21: hgb a1c 6.5; chol 95; ldl 58; trig 75; hdl 22; PSA 0.37 09-13-21: wbc 7.4; hgb 15.3; hct 48.5; mcv 94.0 plt 180; glucose  247; bun 43; creat 2.11; k+ 4.7; na++ 139; ca 8.3; GFR 32; d-dimer <0.27; CRP 2.34 09-29-21: wbc 8.0; hgb 14.9; hct 46.9; mcv 94.4 plt 170; glucose 168; bun 50; creat 2.04; k+ 4.5; na++ 134; ca 7.9; GFR 33; urine culture: no growth   NO NEW LABS.   Review of Systems  Reason unable to perform ROS: unable to participate.   Physical Exam Constitutional:      General: He is not in acute distress.    Appearance: He is well-developed. He is not diaphoretic.  Neck:     Thyroid: No thyromegaly.  Cardiovascular:     Rate and Rhythm: Normal rate and regular rhythm.     Pulses: Normal pulses.     Heart sounds: Normal heart sounds.  Pulmonary:     Effort: Pulmonary effort is normal. No respiratory distress.     Breath sounds: Normal breath sounds.  Abdominal:     General: Bowel sounds are normal. There is no distension.     Palpations: Abdomen is soft.     Tenderness: There is no abdominal tenderness.  Genitourinary:    Comments: Foley present.  On side of shaft of penis approximately 4 mm. There is no drainage present this appears to be superficial  Musculoskeletal:     Cervical back: Neck supple.     Right lower leg: No edema.     Left lower leg: No edema.     Comments: Right hemiplegia   Lymphadenopathy:     Cervical: No cervical adenopathy.  Skin:    General: Skin is warm and dry.  Neurological:     Mental Status: He is alert. Mental status is at baseline.  Psychiatric:        Mood and Affect: Mood normal.     ASSESSMENT/ PLAN:  TODAY  Penile injury: will not make changes will  monitor the area .    Ok Edwards NP [facility]  Contact [phone] Monday through Friday 8am- 5pm  After hours call [phone]

## 2021-12-09 ENCOUNTER — Other Ambulatory Visit (HOSPITAL_COMMUNITY)
Admission: AD | Admit: 2021-12-09 | Discharge: 2021-12-09 | Disposition: A | Discharge status: Home / Self Care | Payer: Medicare PPO | Source: Skilled Nursing Facility | Attending: Adult Health | Admitting: Adult Health

## 2021-12-09 DIAGNOSIS — N390 Urinary tract infection, site not specified: Secondary | ICD-10-CM | POA: Insufficient documentation

## 2021-12-10 LAB — URINE CULTURE: Special Requests: NORMAL

## 2021-12-14 ENCOUNTER — Other Ambulatory Visit (HOSPITAL_COMMUNITY)
Admission: AD | Admit: 2021-12-14 | Discharge: 2021-12-14 | Disposition: A | Discharge status: Home / Self Care | Payer: Medicare PPO | Source: Skilled Nursing Facility | Attending: Adult Health | Admitting: Adult Health

## 2021-12-14 ENCOUNTER — Non-Acute Institutional Stay (SKILLED_NURSING_FACILITY): Payer: Medicare PPO | Admitting: Adult Health

## 2021-12-14 ENCOUNTER — Encounter: Payer: Self-pay | Admitting: Adult Health

## 2021-12-14 DIAGNOSIS — N390 Urinary tract infection, site not specified: Secondary | ICD-10-CM

## 2021-12-14 NOTE — Progress Notes (Signed)
Location:  [place] Room Number: 130 Place of Service:  SNF (31)   CODE STATUS: dnr   No Known Allergies  Chief Complaint  Patient presents with   Acute Visit    UTI     HPI:  Staff is concerned about him having a UTI. He is having increased confusion; his appetite is poor for the past several days. There is change in his urine: more foul. There are no reports of fevers present.   Past Medical History:  Diagnosis Date   A-fib [facility])    a. Post-op afib after CABG 08/2012.   Acute gastric ulcer with hemorrhage    Acute respiratory failure with hypoxia (HCC)    Aphasia following cerebral infarction    CAD (coronary artery disease)    a. NSTEMI s/p stent to distal RCA 03/2000. b. Inf MI s/p emergent thrombectomy/stenting mid RCA 10/2000. c. NSTEMI s/p CABGx4 (LIMA-LAD, SVG-OM2, seq SVG-acute marginal and PD) 08/17/28 - course complicated by confusion, post-op AF, L pleural effusion with thoracentesis. d. Normal LV function by echo 08/2012.   Diverticulosis    DM with CKD    Dysphagia following cerebral infarction    Encephalopathy    Extended spectrum beta lactamase (ESBL) resistance    Generalized anxiety disorder    GERD (gastroesophageal reflux disease)    Hemiplegia and hemiparesis following cerebral infarction affecting right dominant side (HCC)    Hiatal hernia    HLD (hyperlipidemia)    HTN (hypertension)    Non-ST elevation (NSTEMI) myocardial infarction [facility])    Nontraumatic intracerebral hemorrhage in hemisphere, subcortical [facility])    Peripheral vascular disease ([place])    a. Carotid dopplers neg 08/13/12. b. Pre-cabg ABIs - R=0.87 suggesting mild dz, L=1.29 possibly falsely elevated due to calcified vessels.   Pleural effusion    a. L pleural eff after CABG s/p thoracentesis 08/22/12.   Prostate cancer [facility])    Status post radiation treatment.   Prostatic hypertrophy    a. Hx of urinary retention, awaiting TURP.   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 Mild  MR by TEE 08/2012.    Past Surgical History:  Procedure Laterality Date   APPENDECTOMY     BACK SURGERY     CORONARY ARTERY BYPASS GRAFT  08/16/2012   Procedure: CORONARY ARTERY BYPASS GRAFTING (CABG);  Surgeon: Melrose Nakayama, MD;  Location: [place];  Service: Open Heart Surgery;  Laterality: N/A;   IR REPLC GASTRO/COLONIC TUBE PERCUT W/FLUORO  02/13/2019   LEFT HEART CATHETERIZATION WITH CORONARY ANGIOGRAM N/A 08/14/2012   Procedure: LEFT HEART CATHETERIZATION WITH CORONARY ANGIOGRAM;  Surgeon: Peter M Martinique, MD;  Location: [facility] CATH LAB;  Service: Cardiovascular;  Laterality: N/A;    Social History   Socioeconomic History   Marital status: Married    Spouse name: Not on file   Number of children: Not on file   Years of education: Not on file   Highest education level: Not on file  Occupational History   Occupation: retired   Tobacco Use   Smoking status: Former    Types: Cigarettes    Quit date: 11/12/1972    Years since quitting: 49.1   Smokeless tobacco: Never  Vaping Use   Vaping Use: Never used  Substance and Sexual Activity   Alcohol use: No  Drug use: No   Sexual activity: Not Currently  Other Topics Concern   Not on file  Social History Narrative   He is a long term patient of [facility]    Social Determinants of Health   Financial Resource Strain: Not on file  Food Insecurity: Not on file  Transportation Needs: Not on file  Physical Activity: Not on file  Stress: Not on file  Social Connections: Not on file  Intimate Partner Violence: Not on file   Family History  Problem Relation Age of Onset   Hypertension Mother       VITAL SIGNS BP 120/70    Pulse 68    Temp 97.6 F (36.4 C)    Resp 18    Ht 5' 11"  (1.803 m)    Wt 171 lb 6.4 oz (77.7 kg)    BMI 23.91 kg/m   Outpatient Encounter Medications as of  12/14/2021  Medication Sig   acetaminophen (TYLENOL) 325 MG tablet Take 650 mg by mouth every 4 (four) hours as needed. per standing order for pain/fever DO NOT EXCEED >3,000 mg in 24 hours   Amino Acids-Protein Hydrolys (FEEDING SUPPLEMENT, PRO-STAT SUGAR FREE 64,) LIQD Take 30 mLs by mouth 3 (three) times daily with meals. for albumin 2.6   amLODipine (NORVASC) 5 MG tablet Take 5 mg by mouth daily.    apixaban (ELIQUIS) 5 MG TABS tablet Take 5 mg by mouth 2 (two) times daily.   atorvastatin (LIPITOR) 40 MG tablet Take 40 mg by mouth daily.   Baclofen 5 MG TABS Take 5 mg by mouth 3 (three) times daily. Nontraumatic intracerebral hemorrhage in hemisphere, subcortical   Balsam [place]-Castor Oil (VENELEX) OINT Apply topically. Special Instructions: Apply to sacrum, coccyx and bilateral buttocks qshift for prevention/ redness. Every Shift Day, Evening, Night   Eyelid Cleansers (OCUSOFT LID SCRUB EX) daily. Cleanse both eye lids   Glucerna (GLUCERNA) LIQD Take 237 mLs by mouth daily.   hydrocortisone cream 1 % Apply 1 application topically as needed (for hemorrhoids).   insulin glargine (LANTUS) 100 UNIT/ML injection Inject 20 Units into the skin daily.   insulin lispro (HUMALOG) 100 UNIT/ML cartridge Inject 10 Units into the skin 3 (three) times daily before meals. Give 10 units SQ with meals if Blood Sugar >150   lansoprazole (PREVACID SOLUTAB) 30 MG disintegrating tablet Take 30 mg by mouth daily. Give before meals   loperamide (IMODIUM A-D) 2 MG tablet Take 2 mg by mouth as needed for diarrhea or loose stools. May give another dose after each loose stool, up to 8 mg per 24 hours.   loratadine (CLARITIN) 10 MG tablet Take 10 mg by mouth daily.   losartan (COZAAR) 50 MG tablet Take 50 mg by mouth daily.   Melatonin 1 MG TABS Take 2 tablets by mouth at bedtime.    NON FORMULARY Diet Change: Regular, thin liquids   ondansetron (ZOFRAN-ODT) 4 MG disintegrating tablet Take 4 mg by mouth every 6 (six)  hours as needed for nausea or vomiting.   oxybutynin (DITROPAN XL) 15 MG 24 hr tablet Take 15 mg by mouth daily.   polyethylene glycol (MIRALAX / GLYCOLAX) 17 g packet Take 17 g by mouth daily as needed.   rOPINIRole (REQUIP) 0.25 MG tablet Take 0.25 mg by mouth at bedtime.   No facility-administered encounter medications on file as of 12/14/2021.     SIGNIFICANT DIAGNOSTIC EXAMS  PREVIOUS   05-12-21: right lower extremity venous doppler: preliminary report: clot from groin  to foot.   05-12-21: right lower extremity venous doppler (From ED):   1. Extensive acute mostly occlusive deep venous thrombosis extending from the right common femoral vein into the calf veins.  NO NEW EXAMS.    LABS REVIEWED PREVIOUS:    01-07-21: wbc 7.8; hgb 15.3; hct 47.7; mcv 93.7 plt 180; glucose 101; bun 45; creat 1.85; k+ 4.4; na++ 140; ca 8.6 GFR 37; liver normal albumin 2.8 chol 101; ldl 60; trig 77; hdl 26; hgb a1c 6.5; tsh 1.908 02-15-21: urine culture: multiple species  03-25-21 hgb a1c 6.6 05-11-21: urine for micro albumin 1299.7 (413.2) on ARB   05-12-21: wbc 10.6; hgb `15.2; hct 47.9; mcv 95.0 plt 181; glucose 233; bun 49; creat 1.98; k+ 4.4; na++ 137; ca 8.2; GFR34 05-14-21: glucose 116; bun 32; creat 1.68; k+ 4.1; na++ 139; ca 8.2; GFR 42 05-15-21: wbc 7.5; hgb 14.7; hct 44.6; mcv 89.7 plt 182  05-21-21: wbc 8.6; hgb 13.6; hct 42.9; mcv 93.5 plt 209; glucose 147; bun 35; creat 1.75; k+ 4.0 na++ 8.1; GFR 40 07-23-21: wbc 7.4; hgb 15.8; hct 50.3; mcv 94.7 plt 189; urine culture: enterococcus faecalis/providencia stuartii  08-09-21: wbc 8.3; hgb 14.5; hct 45.3; mcv 93.8 plt 192; glucose 127; bun 34; creat 1.83; k+ 4.1; na++ 140; ca 7.8; GFR 38; alk phos 130; albumin 2.6 urine culture: pseudomonas aeruginosa: cipro 09-09-21: hgb a1c 6.5; chol 95; ldl 58; trig 75; hdl 22; PSA 0.37 09-13-21: wbc 7.4; hgb 15.3; hct 48.5; mcv 94.0 plt 180; glucose 247; bun 43; creat 2.11; k+ 4.7; na++ 139; ca 8.3; GFR 32; d-dimer <0.27;  CRP 2.34 09-29-21: wbc 8.0; hgb 14.9; hct 46.9; mcv 94.4 plt 170; glucose 168; bun 50; creat 2.04; k+ 4.5; na++ 134; ca 7.9; GFR 33; urine culture: no growth   TODAY  12-07-21: urine culture: mixed bacteria   Review of Systems  Reason unable to perform ROS: unable to participate.   Physical Exam Constitutional:      General: He is not in acute distress.    Appearance: He is well-developed. He is not diaphoretic.  Neck:     Thyroid: No thyromegaly.  Cardiovascular:     Rate and Rhythm: Normal rate and regular rhythm.     Pulses: Normal pulses.     Heart sounds: Normal heart sounds.  Pulmonary:     Effort: Pulmonary effort is normal. No respiratory distress.     Breath sounds: Normal breath sounds.  Abdominal:     General: Bowel sounds are normal. There is no distension.     Palpations: Abdomen is soft.     Tenderness: There is no abdominal tenderness.  Genitourinary:    Comments: Foley present. Musculoskeletal:     Cervical back: Neck supple.     Right lower leg: No edema.     Left lower leg: No edema.     Comments: Right hemiplegia  Lymphadenopathy:     Cervical: No cervical adenopathy.  Skin:    General: Skin is warm and dry.  Neurological:     Mental Status: He is alert. Mental status is at baseline.  Psychiatric:        Mood and Affect: Mood normal.      ASSESSMENT/ PLAN:  TODAY  Acute UTI: will begin cipro 500 mg twice daily through 12-21-21 will get I/O cath for new specimen monitor his status;    Tanner Edwards NP [facility]   call [phone]

## 2021-12-16 DIAGNOSIS — Z1159 Encounter for screening for other viral diseases: Secondary | ICD-10-CM | POA: Diagnosis not present

## 2021-12-16 DIAGNOSIS — I441 Atrioventricular block, second degree: Secondary | ICD-10-CM | POA: Diagnosis not present

## 2021-12-17 ENCOUNTER — Encounter: Payer: Self-pay | Admitting: Adult Health

## 2021-12-17 ENCOUNTER — Non-Acute Institutional Stay (INDEPENDENT_AMBULATORY_CARE_PROVIDER_SITE_OTHER): Payer: Medicare PPO | Admitting: Adult Health

## 2021-12-17 DIAGNOSIS — Z0000 Encounter for general adult medical examination without abnormal findings: Secondary | ICD-10-CM

## 2021-12-17 LAB — URINE CULTURE: Culture: 40000 — AB

## 2021-12-17 NOTE — Progress Notes (Signed)
Location:  [place] Room Number: 850-Y Place of Service:  SNF (31)   CODE STATUS: DNR  No Known Allergies  Chief Complaint  Patient presents with   Acute Visit    Follow-up UTI    HPI:    Past Medical History:  Diagnosis Date   A-fib ([place])    a. Post-op afib after CABG 08/2012.   Acute gastric ulcer with hemorrhage    Acute respiratory failure with hypoxia (HCC)    Aphasia following cerebral infarction    CAD (coronary artery disease)    a. NSTEMI s/p stent to distal RCA 03/2000. b. Inf MI s/p emergent thrombectomy/stenting mid RCA 10/2000. c. NSTEMI s/p CABGx4 (LIMA-LAD, SVG-OM2, seq SVG-acute marginal and PD) 06/17/40 - course complicated by confusion, post-op AF, L pleural effusion with thoracentesis. d. Normal LV function by echo 08/2012.   Diverticulosis    DM with CKD    Dysphagia following cerebral infarction    Encephalopathy    Extended spectrum beta lactamase (ESBL) resistance    Generalized anxiety disorder    GERD (gastroesophageal reflux disease)    Hemiplegia and hemiparesis following cerebral infarction affecting right dominant side (HCC)    Hiatal hernia    HLD (hyperlipidemia)    HTN (hypertension)    Non-ST elevation (NSTEMI) myocardial infarction [facility])    Nontraumatic intracerebral hemorrhage in hemisphere, subcortical [facility])    Peripheral vascular disease ([place])    a. Carotid dopplers neg 08/13/12. b. Pre-cabg ABIs - R=0.87 suggesting mild dz, L=1.29 possibly falsely elevated due to calcified vessels.   Pleural effusion    a. L pleural eff after CABG s/p thoracentesis 08/22/12.   Prostate cancer [facility])    Status post radiation treatment.   Prostatic hypertrophy    a. Hx of urinary retention, awaiting TURP.   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 Mild  MR by TEE 08/2012.    Past Surgical History:   Procedure Laterality Date   APPENDECTOMY     BACK SURGERY     CORONARY ARTERY BYPASS GRAFT  08/16/2012   Procedure: CORONARY ARTERY BYPASS GRAFTING (CABG);  Surgeon: Melrose Nakayama, MD;  Location: [place];  Service: Open Heart Surgery;  Laterality: N/A;   IR REPLC GASTRO/COLONIC TUBE PERCUT W/FLUORO  02/13/2019   LEFT HEART CATHETERIZATION WITH CORONARY ANGIOGRAM N/A 08/14/2012   Procedure: LEFT HEART CATHETERIZATION WITH CORONARY ANGIOGRAM;  Surgeon: Peter M Martinique, MD;  Location: [facility] CATH LAB;  Service: Cardiovascular;  Laterality: N/A;    Social History   Socioeconomic History   Marital status: Married    Spouse name: Not on file   Number of children: Not on file   Years of education: Not on file   Highest education level: Not on file  Occupational History   Occupation: retired   Tobacco Use   Smoking status: Former    Types: Cigarettes    Quit date: 11/12/1972    Years since quitting: 49.1   Smokeless tobacco: Never  Vaping Use   Vaping Use: Never used  Substance and Sexual Activity   Alcohol use: No   Drug use: No   Sexual activity: Not Currently  Other Topics Concern   Not on file  Social History Narrative   He is a long [facility] patient of [facility]    Social Determinants of Health  Financial Resource Strain: Not on file  Food Insecurity: Not on file  Transportation Needs: Not on file  Physical Activity: Not on file  Stress: Not on file  Social Connections: Not on file  Intimate Partner Violence: Not on file   Family History  Problem Relation Age of Onset   Hypertension Mother       VITAL SIGNS BP 120/70    Pulse 68    Temp 98.2 F (36.8 C)    Resp 18    Ht 5\' 11"  (1.803 m)    Wt 171 lb 6.4 oz (77.7 kg)    SpO2 95%    BMI 23.91 kg/m   Outpatient Encounter Medications as of 12/17/2021  Medication Sig   acetaminophen (TYLENOL) 325 MG tablet Take 650 mg by mouth every 4 (four) hours as needed. per standing order for pain/fever DO NOT EXCEED >3,000 mg in 24 hours    Amino Acids-Protein Hydrolys (FEEDING SUPPLEMENT, PRO-STAT SUGAR FREE 64,) LIQD Take 30 mLs by mouth 3 (three) times daily with meals. for albumin 2.6   amLODipine (NORVASC) 5 MG tablet Take 5 mg by mouth daily.    apixaban (ELIQUIS) 5 MG TABS tablet Take 5 mg by mouth 2 (two) times daily.   atorvastatin (LIPITOR) 40 MG tablet Take 40 mg by mouth daily.   Baclofen 5 MG TABS Take 5 mg by mouth 3 (three) times daily. Nontraumatic intracerebral hemorrhage in hemisphere, subcortical   Balsam [place]-Castor Oil (VENELEX) OINT Apply topically. Special Instructions: Apply to sacrum, coccyx and bilateral buttocks qshift for prevention/ redness. Every Shift Day, Evening, Night   Eyelid Cleansers (OCUSOFT LID SCRUB EX) daily. Cleanse both eye lids   Glucerna (GLUCERNA) LIQD Take 237 mLs by mouth daily.   hydrocortisone cream 1 % Apply 1 application topically as needed (for hemorrhoids).   insulin glargine (LANTUS) 100 UNIT/ML injection Inject 20 Units into the skin daily.   insulin lispro (HUMALOG) 100 UNIT/ML cartridge Inject 10 Units into the skin 3 (three) times daily before meals. Give 10 units SQ with meals if Blood Sugar >150   lansoprazole (PREVACID SOLUTAB) 30 MG disintegrating tablet Take 30 mg by mouth daily. Give before meals   loperamide (IMODIUM A-D) 2 MG tablet Take 2 mg by mouth as needed for diarrhea or loose stools. May give another dose after each loose stool, up to 8 mg per 24 hours.   loratadine (CLARITIN) 10 MG tablet Take 10 mg by mouth daily.   losartan (COZAAR) 50 MG tablet Take 50 mg by mouth daily.   Melatonin 1 MG TABS Take 2 tablets by mouth at bedtime.    NON FORMULARY Diet Change: Regular, thin liquids   ondansetron (ZOFRAN-ODT) 4 MG disintegrating tablet Take 4 mg by mouth every 6 (six) hours as needed for nausea or vomiting.   oxybutynin (DITROPAN XL) 15 MG 24 hr tablet Take 15 mg by mouth daily.   polyethylene glycol (MIRALAX / GLYCOLAX) 17 g packet Take 17 g by mouth daily  as needed.   rOPINIRole (REQUIP) 0.25 MG tablet Take 0.25 mg by mouth at bedtime.   sulfamethoxazole-trimethoprim (BACTRIM DS) 800-160 MG tablet Take 1 tablet by mouth 2 (two) times daily. Special Instructions: for MRSA UTI   No facility-administered encounter medications on file as of 12/17/2021.     SIGNIFICANT DIAGNOSTIC EXAMS       ASSESSMENT/ PLAN:     Ok Edwards NP [facility]  Contact [phone] Monday through Friday 8am- 5pm  After hours  call [phone]

## 2021-12-21 ENCOUNTER — Encounter: Payer: Self-pay | Admitting: Adult Health

## 2021-12-21 NOTE — Progress Notes (Signed)
Subjective:   [name] is a 76 y.o. male who presents for Medicare Annual/Subsequent preventive examination.  Review of Systems    Review of Systems  Reason unable to perform ROS: unable to participate.   Cardiac Risk Factors include: advanced age (>68men, >45 women);diabetes mellitus;dyslipidemia;sedentary lifestyle     Objective:    Today's Vitals   12/17/21 1503  BP: 120/70  Pulse: 68  Resp: 18  Temp: 98.2 F (36.8 C)  SpO2: 95%  Weight: 171 lb 6.4 oz (77.7 kg)  Height: 5\' 11"  (1.803 m)   Body mass index is 23.91 kg/m.  Advanced Directives 12/17/2021 12/08/2021 11/25/2021 10/29/2021 09/23/2021 09/13/2021 09/06/2021  Does Patient Have a Medical Advance Directive? Yes Yes Yes Yes Yes Yes Yes  Type of Advance Directive Out of facility DNR (pink MOST or yellow form) Out of facility DNR (pink MOST or yellow form) Out of facility DNR (pink MOST or yellow form) Out of facility DNR (pink MOST or yellow form) Out of facility DNR (pink MOST or yellow form) Out of facility DNR (pink MOST or yellow form) Out of facility DNR (pink MOST or yellow form)  Does patient want to make changes to medical advance directive? No - Patient declined No - Patient declined No - Patient declined No - Patient declined No - Patient declined No - Patient declined No - Patient declined  Would patient like information on creating a medical advance directive? - - - - - - -  Pre-existing out of facility DNR order (yellow form or pink MOST form) Yellow form placed in chart (order not valid for inpatient use) Yellow form placed in chart (order not valid for inpatient use);Pink MOST form placed in chart (order not valid for inpatient use) - - Yellow form placed in chart (order not valid for inpatient use);Pink MOST form placed in chart (order not valid for inpatient use) Yellow form placed in chart (order not valid for inpatient use) -  Some encounter information is confidential and restricted. Go to Review  Flowsheets activity to see all data.    Current Medications (verified) Outpatient Encounter Medications as of 12/17/2021  Medication Sig   acetaminophen (TYLENOL) 325 MG tablet Take 650 mg by mouth every 4 (four) hours as needed. per standing order for pain/fever DO NOT EXCEED >3,000 mg in 24 hours   Amino Acids-Protein Hydrolys (FEEDING SUPPLEMENT, PRO-STAT SUGAR FREE 64,) LIQD Take 30 mLs by mouth 3 (three) times daily with meals. for albumin 2.6   amLODipine (NORVASC) 5 MG tablet Take 5 mg by mouth daily.    apixaban (ELIQUIS) 5 MG TABS tablet Take 5 mg by mouth 2 (two) times daily.   atorvastatin (LIPITOR) 40 MG tablet Take 40 mg by mouth daily.   Baclofen 5 MG TABS Take 5 mg by mouth 3 (three) times daily. Nontraumatic intracerebral hemorrhage in hemisphere, subcortical   Balsam [place]-Castor Oil (VENELEX) OINT Apply topically. Special Instructions: Apply to sacrum, coccyx and bilateral buttocks qshift for prevention/ redness. Every Shift Day, Evening, Night   Eyelid Cleansers (OCUSOFT LID SCRUB EX) daily. Cleanse both eye lids   Glucerna (GLUCERNA) LIQD Take 237 mLs by mouth daily.   hydrocortisone cream 1 % Apply 1 application topically as needed (for hemorrhoids).   insulin glargine (LANTUS) 100 UNIT/ML injection Inject 20 Units into the skin daily.   insulin lispro (HUMALOG) 100 UNIT/ML cartridge Inject 10 Units into the skin 3 (three) times daily before meals. Give 10 units SQ with meals if  Blood Sugar >150   lansoprazole (PREVACID SOLUTAB) 30 MG disintegrating tablet Take 30 mg by mouth daily. Give before meals   loperamide (IMODIUM A-D) 2 MG tablet Take 2 mg by mouth as needed for diarrhea or loose stools. May give another dose after each loose stool, up to 8 mg per 24 hours.   loratadine (CLARITIN) 10 MG tablet Take 10 mg by mouth daily.   losartan (COZAAR) 50 MG tablet Take 50 mg by mouth daily.   Melatonin 1 MG TABS Take 2 tablets by mouth at bedtime.    NON FORMULARY Diet Change:  Regular, thin liquids   ondansetron (ZOFRAN-ODT) 4 MG disintegrating tablet Take 4 mg by mouth every 6 (six) hours as needed for nausea or vomiting.   oxybutynin (DITROPAN XL) 15 MG 24 hr tablet Take 15 mg by mouth daily.   polyethylene glycol (MIRALAX / GLYCOLAX) 17 g packet Take 17 g by mouth daily as needed.   rOPINIRole (REQUIP) 0.25 MG tablet Take 0.25 mg by mouth at bedtime.   sulfamethoxazole-trimethoprim (BACTRIM DS) 800-160 MG tablet Take 1 tablet by mouth 2 (two) times daily. Special Instructions: for MRSA UTI   No facility-administered encounter medications on file as of 12/17/2021.    Allergies (verified) Patient has no known allergies.   History: Past Medical History:  Diagnosis Date   A-fib ([place])    a. Post-op afib after CABG 08/2012.   Acute gastric ulcer with hemorrhage    Acute respiratory failure with hypoxia (HCC)    Aphasia following cerebral infarction    CAD (coronary artery disease)    a. NSTEMI s/p stent to distal RCA 03/2000. b. Inf MI s/p emergent thrombectomy/stenting mid RCA 10/2000. c. NSTEMI s/p CABGx4 (LIMA-LAD, SVG-OM2, seq SVG-acute marginal and PD) 02/14/13 - course complicated by confusion, post-op AF, L pleural effusion with thoracentesis. d. Normal LV function by echo 08/2012.   Diverticulosis    DM with CKD    Dysphagia following cerebral infarction    Encephalopathy    Extended spectrum beta lactamase (ESBL) resistance    Generalized anxiety disorder    GERD (gastroesophageal reflux disease)    Hemiplegia and hemiparesis following cerebral infarction affecting right dominant side (HCC)    Hiatal hernia    HLD (hyperlipidemia)    HTN (hypertension)    Non-ST elevation (NSTEMI) myocardial infarction [facility])    Nontraumatic intracerebral hemorrhage in hemisphere, subcortical [facility])    Peripheral vascular disease ([place])    a. Carotid dopplers neg 08/13/12. b. Pre-cabg ABIs - R=0.87 suggesting mild dz, L=1.29 possibly falsely elevated due to calcified  vessels.   Pleural effusion    a. L pleural eff after CABG s/p thoracentesis 08/22/12.   Prostate cancer [facility])    Status post radiation treatment.   Prostatic hypertrophy    a. Hx of urinary retention, awaiting TURP.   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 Mild  MR by TEE 08/2012.   Past Surgical History:  Procedure Laterality Date   APPENDECTOMY     BACK SURGERY     CORONARY ARTERY BYPASS GRAFT  08/16/2012   Procedure: CORONARY ARTERY BYPASS GRAFTING (CABG);  Surgeon: Melrose Nakayama, MD;  Location: [place];  Service: Open Heart Surgery;  Laterality: N/A;   IR REPLC GASTRO/COLONIC TUBE PERCUT W/FLUORO  02/13/2019   LEFT HEART CATHETERIZATION WITH CORONARY  ANGIOGRAM N/A 08/14/2012   Procedure: LEFT HEART CATHETERIZATION WITH CORONARY ANGIOGRAM;  Surgeon: Peter M Martinique, MD;  Location: [facility] CATH LAB;  Service: Cardiovascular;  Laterality: N/A;   Family History  Problem Relation Age of Onset   Hypertension Mother    Social History   Socioeconomic History   Marital status: Married    Spouse name: Not on file   Number of children: Not on file   Years of education: Not on file   Highest education level: Not on file  Occupational History   Occupation: retired   Tobacco Use   Smoking status: Former    Types: Cigarettes    Quit date: 11/12/1972    Years since quitting: 49.1   Smokeless tobacco: Never  Vaping Use   Vaping Use: Never used  Substance and Sexual Activity   Alcohol use: No   Drug use: No   Sexual activity: Not Currently  Other Topics Concern   Not on file  Social History Narrative   He is a long term patient of [facility]    Social Determinants of Health   Financial Resource Strain: Not on file  Food Insecurity: Not on file  Transportation Needs: Not on file  Physical Activity: Not on file  Stress: Not on file  Social Connections: Not on  file    Tobacco Counseling Counseling given: Not Answered   Clinical Intake:  Pre-visit preparation completed: Yes  Pain : No/denies pain     BMI - recorded: 23.91 Nutritional Status: BMI of 19-24  Normal Nutritional Risks: Unintentional weight loss Diabetes: Yes CBG done?: Yes CBG resulted in Enter/ Edit results?:  (per facility) Did pt. bring in CBG monitor from home?: No  How often do you need to have someone help you when you read instructions, pamphlets, or other written materials from your doctor or pharmacy?: 5 - Always  Diabetic?yes  Interpreter Needed?: No      Activities of Daily Living In your present state of health, do you have any difficulty performing the following activities: 12/21/2021  Hearing? N  Vision? N  Difficulty concentrating or making decisions? Y  Walking or climbing stairs? Y  Dressing or bathing? Y  Doing errands, shopping? Y  Preparing Food and eating ? Y  Using the Toilet? Y  In the past six months, have you accidently leaked urine? Y  Do you have problems with loss of bowel control? Y  Managing your Medications? Y  Managing your Finances? Y  Housekeeping or managing your Housekeeping? Y  Some recent data might be hidden    Patient Care Team: [name], NP as PCP - General (Geriatric Medicine) [name], MD as Consulting Physician (Urology)  Indicate any recent Medical Services you may have received from other than [facility] providers in the past year (date may be approximate).     Assessment:   This is a routine wellness examination for Shiheem.  Hearing/Vision screen No results found.  Dietary issues and exercise activities discussed: Current Exercise Habits: The patient does not participate in regular exercise at present, Exercise limited by: neurologic condition(s)   Goals Addressed             This Visit's Progress    Absence of Fall and Fall-Related Injury   On track    Evidence-based guidance:   Assess fall risk using a validated tool when available. Consider balance and gait impairment, muscle weakness, diminished vision or hearing, environmental hazards, presence of urinary or bowel urgency and/or incontinence.  Communicate fall injury risk to interprofessional healthcare team.  Develop a fall prevention plan with the patient and family.  Promote use of personal vision and auditory aids.  Promote reorientation, appropriate sensory stimulation, and routines to decrease risk of fall when changes in mental status are present.  Assess assistance level required for safe and effective self-care; consider referral for home care.  Encourage physical activity, such as performance of self-care at highest level of ability, strength and balance exercise program, and provision of appropriate assistive devices; refer to rehabilitation therapy.  Refer to community-based fall prevention program where available.  If fall occurs, determine the cause and revise fall injury prevention plan.  Regularly review medication contribution to fall risk; consider risk related to polypharmacy and age.  Refer to pharmacist for consultation when concerns about medications are revealed.  Balance adequate pain management with potential for oversedation.  Provide guidance related to environmental modifications.  Consider supplementation with Vitamin D.   Notes:      Follow up with Provider as scheduled   On track    General - Client will not be readmitted within 30 days ([facility])   On track      Depression Screen [facility] 2/9 Scores 12/21/2021 11/02/2020 10/30/2019  Exception Documentation Medical reason Other- indicate reason in comment box Other- indicate reason in comment box  Not completed - expressive aphasia unable to participate    Fall Risk Fall Risk  12/21/2021 12/16/2021 11/02/2020 10/30/2019 10/30/2019  Falls in the past year? 0 0 1 1 -  Number falls in past yr: 0 0 1 1 -  Injury with Fall? 0 0 0 0 0  Risk  for fall due to : Impaired balance/gait - History of fall(s);Impaired balance/gait;Impaired mobility History of fall(s);Impaired mobility History of fall(s)  Follow up - - Falls evaluation completed - -    FALL RISK PREVENTION PERTAINING TO THE HOME:  Any stairs in or around the home? No  If so, are there any without handrails? No  Home free of loose throw rugs in walkways, pet beds, electrical cords, etc? Yes  Adequate lighting in your home to reduce risk of falls? Yes   ASSISTIVE DEVICES UTILIZED TO PREVENT FALLS:  Life alert? No  Use of a cane, walker or w/c? Yes  Grab bars in the bathroom? Yes  Shower chair or bench in shower? Yes  Elevated toilet seat or a handicapped toilet? Yes   TIMED UP AND GO:  Was the test performed? No .  Length of time to ambulate 10 feet: nonambulatory  .   Gait unsteady with use of assistive device, provider informed and education provided.   Cognitive Function: MMSE - Mini Mental State Exam 12/21/2021 11/02/2020  Not completed: Unable to complete Unable to complete     6CIT Screen 11/02/2020  What Year? (No Data)    Immunizations Immunization History  Administered Date(s) Administered   Influenza,inj,Quad PF,6+ Mos 09/15/2021   Influenza-Unspecified 09/16/2019, 09/18/2020   Moderna Covid-19 Vaccine Bivalent Booster 56yrs & up 10/05/2021   Moderna SARS-COV2 Booster Vaccination 03/24/2021   Moderna Sars-Covid-2 Vaccination 12/18/2019, 01/15/2020, 10/15/2020   PNEUMOCOCCAL CONJUGATE-20 06/30/2021   Pneumococcal Conjugate-13 07/31/2014   Pneumococcal-Unspecified 09/03/2019   Td 09/03/2019   Tdap 07/31/2014   Zoster Recombinat (Shingrix) 11/12/2021   Zoster, Live 12/02/2009   Zoster, Unspecified 08/11/2021    TDAP status: Up to date  Flu Vaccine status: Up to date  Pneumococcal vaccine status: Up to date  Covid-19 vaccine status: Completed vaccines  Qualifies for Shingles Vaccine? Yes   Zostavax completed Yes   Shingrix  Completed?: Yes  Screening Tests Health Maintenance  Topic Date Due   Zoster Vaccines- Shingrix (2 of 2) 01/07/2022   OPHTHALMOLOGY EXAM  02/10/2022   HEMOGLOBIN A1C  03/09/2022   URINE MICROALBUMIN  05/11/2022   FOOT EXAM  07/12/2022   TETANUS/TDAP  09/02/2029   Pneumonia Vaccine 45+ Years old  Completed   INFLUENZA VACCINE  Completed   COVID-19 Vaccine  Completed   Hepatitis C Screening  Completed   HPV VACCINES  Aged Out    Health Maintenance  There are no preventive care reminders to display for this patient.  Colorectal cancer screening: No longer required.   Lung Cancer Screening: (Low Dose CT Chest recommended if Age 11-80 years, 30 pack-year currently smoking OR have quit w/in 15years.) does not qualify.   Lung Cancer Screening Referral: n/a  Additional Screening:  Hepatitis C Screening: does qualify; Completed has been ordered   Vision Screening: Recommended annual ophthalmology exams for early detection of glaucoma and other disorders of the eye. Is the patient up to date with their annual eye exam?  Yes  Who is the provider or what is the name of the office in which the patient attends annual eye exams?  If pt is not established with a provider, would they like to be referred to a provider to establish care? Yes .   Dental Screening: Recommended annual dental exams for proper oral hygiene  Community Resource Referral / Chronic Care Management: CRR required this visit?  No   CCM required this visit?  No      Plan:     I have personally reviewed and noted the following in the patients chart:   Medical and social history Use of alcohol, tobacco or illicit drugs  Current medications and supplements including opioid prescriptions. Patient is not currently taking opioid prescriptions. Functional ability and status Nutritional status Physical activity Advanced directives List of other physicians Hospitalizations, surgeries, and ER visits in previous 12  months Vitals Screenings to include cognitive, depression, and falls Referrals and appointments  In addition, I have reviewed and discussed with patient certain preventive protocols, quality metrics, and best practice recommendations. A written personalized care plan for preventive services as well as general preventive health recommendations were provided to patient.     [name], NP   12/21/2021   Nurse Notes:

## 2021-12-23 DIAGNOSIS — I441 Atrioventricular block, second degree: Secondary | ICD-10-CM | POA: Diagnosis not present

## 2021-12-23 DIAGNOSIS — Z1159 Encounter for screening for other viral diseases: Secondary | ICD-10-CM | POA: Diagnosis not present

## 2021-12-27 ENCOUNTER — Encounter: Payer: Self-pay | Admitting: Internal Medicine

## 2021-12-27 ENCOUNTER — Non-Acute Institutional Stay (SKILLED_NURSING_FACILITY): Payer: Medicare PPO | Admitting: Internal Medicine

## 2021-12-27 DIAGNOSIS — N183 Chronic kidney disease, stage 3 unspecified: Secondary | ICD-10-CM | POA: Diagnosis not present

## 2021-12-27 DIAGNOSIS — F323 Major depressive disorder, single episode, severe with psychotic features: Secondary | ICD-10-CM

## 2021-12-27 DIAGNOSIS — E43 Unspecified severe protein-calorie malnutrition: Secondary | ICD-10-CM | POA: Diagnosis not present

## 2021-12-27 DIAGNOSIS — S3994XS Unspecified injury of external genitals, sequela: Secondary | ICD-10-CM | POA: Diagnosis not present

## 2021-12-27 DIAGNOSIS — G8111 Spastic hemiplegia affecting right dominant side: Secondary | ICD-10-CM

## 2021-12-27 DIAGNOSIS — I152 Hypertension secondary to endocrine disorders: Secondary | ICD-10-CM

## 2021-12-27 DIAGNOSIS — E1122 Type 2 diabetes mellitus with diabetic chronic kidney disease: Secondary | ICD-10-CM

## 2021-12-27 DIAGNOSIS — E1159 Type 2 diabetes mellitus with other circulatory complications: Secondary | ICD-10-CM | POA: Diagnosis not present

## 2021-12-27 DIAGNOSIS — I4811 Longstanding persistent atrial fibrillation: Secondary | ICD-10-CM

## 2021-12-27 NOTE — Assessment & Plan Note (Signed)
Clinically the rhythm is essentially regular and the rate adequately controlled.

## 2021-12-27 NOTE — Patient Instructions (Signed)
See assessment and plan under each diagnosis in the problem list and acutely for this visit 

## 2021-12-27 NOTE — Assessment & Plan Note (Addendum)
BP controlled; no change in antihypertensive medications Diabetic control is excellent.  Fasting blood sugars at the facility range from 81-2 24 with average of 127.  AC evening glucoses range from 62-187 with an average of 132.  A1c is sufficiently current and reveals excellent control @ 6.5%.  This would be due for update within the next month.

## 2021-12-27 NOTE — Progress Notes (Signed)
NURSING HOME LOCATION: [facility] ROOM NUMBER:  [id number]  CODE STATUS:  DNR  PCP:  Ok Edwards NP,PSC  This is a nursing facility follow up visit of chronic medical diagnoses & to document compliance with Regulation 483.30 (c) in The [place] Manual Phase 2 which mandates caregiver visit ( visits can alternate among physician, PA or NP as per statutes) within 10 days of 30 days / 60 days/ 90 days post admission to SNF date    Interim medical record and care since last SNF visit was updated with review of diagnostic studies and change in clinical status since last visit were documented.  HPI: He is a permanent resident of the facility with diagnoses of history of gastric ulcer with hemorrhage; history of A. fib; CAD; history of diverticulosis; diabetes with CKD; GERD; history of stroke with hemiplegia and hemiparesis affecting the right dominant side; dyslipidemia, essential hypertension, and history of prostate cancer. Surgeries include CABG and PEG insertion.  Current labs revealed mild hyponatremia with a sodium of 134.  CKD 3B is present with a creatinine of 2.04 and GFR 33.  A1c was completed 9/29 and revealed a value of 6.5%, revealing excellent control.  Accu-Cheks are done twice a day.  Morning ranges 81-224 with an average of 127.  The premeal range in the evenings is 62-187 with an average of 132. Staff reports that urinary leakage due to the urethral split has required placement of an 18-gauge Foley cath in place of a 16-gauge.  Urology follow-up is pending.  Review of systems: Neurocognitive deficit prevented adequate completion of review of systems.  He did complain of not sleeping well.  Despite his hemiparesis he denies any weakness.  The remainder of the queries were answered with a monosyllabic no.  Constitutional: No fever, significant weight change, fatigue  Eyes: No redness, discharge, pain, vision change ENT/mouth: No nasal congestion,   purulent discharge, earache, change in hearing, sore throat  Cardiovascular: No chest pain, palpitations, paroxysmal nocturnal dyspnea, claudication, edema  Respiratory: No cough, sputum production, hemoptysis, DOE, significant snoring, apnea   Gastrointestinal: No heartburn, dysphagia, abdominal pain, nausea /vomiting, rectal bleeding, melena, change in bowels Genitourinary: No dysuria, hematuria, pyuria, incontinence, nocturia Musculoskeletal: No joint stiffness, joint swelling, weakness, pain Dermatologic: No rash, pruritus, change in appearance of skin Neurologic: No dizziness, headache, syncope, seizures, numbness, tingling Psychiatric: No significant anxiety, depression, anorexia Endocrine: No change in hair/skin/nails, excessive thirst, excessive hunger, excessive urination  Hematologic/lymphatic: No significant bruising, lymphadenopathy, abnormal bleeding  Physical exam:  Pertinent or positive findings: Facies are blank and the monosyllabic responses are delayed.  Even when awake he was markedly lethargic.  Pattern alopecia is present.  His voice is somewhat hoarse and speech dysarthric.Ptosis is marked on the right.  The left nasolabial fold was decreased compared to the right.  Dental hygiene is immaculate.  He has grade 1/2 systolic murmur at the base.  Second heart sound is increased.  He has minor scattered low-grade rales.  Foley catheter is present.  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  Dorsalis pedis pulses are stronger than posterior tibial pulses.  When the left foot was palpated the left toe was upgoing.  He has profound atrophy of the left lower extremity but clinically appeared somewhat stronger to opposition in LLE than the right lower extremity.  Left upper extremity is stronger than the right upper extremity.  There  is a vitiliginous scar over the right elbow.  He has a small benign papule in the right lateral eyebrow  and also 1 above the right eyebrow medially.  General appearance: Adequately nourished; no acute distress, increased work of breathing is present.   Lymphatic: No lymphadenopathy about the head, neck, axilla. Eyes: No conjunctival inflammation or lid edema is present. There is no scleral icterus. Ears:  External ear exam shows no significant lesions or deformities.   Nose:  External nasal examination shows no deformity or inflammation. Nasal mucosa are pink and moist without lesions, exudates Oral exam:  Lips and gums are healthy appearing. There is no oropharyngeal erythema or exudate. Neck:  No thyromegaly, masses, tenderness noted.    Heart:  Normal rate and regular rhythm. S1 normal without gallop, click, rub .  Lungs: without wheezes, rhonchi, rubs. Abdomen: Bowel sounds are normal. Abdomen is soft and nontender with no organomegaly, hernias, masses. GU: Deferred  Extremities:  No cyanosis, clubbing, edema  Neurologic exam :Balance, Rhomberg, finger to nose testing could not be completed due to clinical state Skin: Warm & dry w/o tenting. No significant rash.  See summary under each active problem in the Problem List with associated updated therapeutic plan

## 2021-12-27 NOTE — Assessment & Plan Note (Addendum)
There is dramatic split of the urethra and also open deficit over the right lateral glans.  Urology follow-up is scheduled.  At the SNF because of leakage the Foley has been changed from 16-gauge to 18-gauge.

## 2021-12-27 NOTE — Assessment & Plan Note (Signed)
His affect is profoundly flat with minimal interaction.  Clinically depression cannot be diagnosed.

## 2021-12-27 NOTE — Progress Notes (Signed)
This encounter was created in error - please disregard.

## 2021-12-27 NOTE — Assessment & Plan Note (Signed)
Most recent albumin was 2.6 and total protein 5.9.  He does have profound atrophy of the left lower extremity.  Updated of the albumin and total protein will be discussed with the [facility] NP.

## 2021-12-27 NOTE — Assessment & Plan Note (Signed)
Current creatinine is 2.04 with a GFR 33, stage IIIb.  This has progressed somewhat. UptoDate indicates there is no need to adjust the losartan dose which is 50 mg daily.

## 2021-12-27 NOTE — Assessment & Plan Note (Signed)
Right hemiplegia persists.  Despite profound atrophy of the left lower extremity it is stronger than the right lower extremity.

## 2021-12-28 DIAGNOSIS — F5105 Insomnia due to other mental disorder: Secondary | ICD-10-CM | POA: Diagnosis not present

## 2021-12-28 DIAGNOSIS — F333 Major depressive disorder, recurrent, severe with psychotic symptoms: Secondary | ICD-10-CM | POA: Diagnosis not present

## 2021-12-30 DIAGNOSIS — I441 Atrioventricular block, second degree: Secondary | ICD-10-CM | POA: Diagnosis not present

## 2021-12-30 DIAGNOSIS — Z1159 Encounter for screening for other viral diseases: Secondary | ICD-10-CM | POA: Diagnosis not present

## 2021-12-31 ENCOUNTER — Encounter: Payer: Self-pay | Admitting: Adult Health

## 2021-12-31 NOTE — Progress Notes (Signed)
Location:  [place] Room Number: 734-L Place of Service:  SNF (31)   CODE STATUS: DNR  No Known Allergies  Chief Complaint  Patient presents with   Medical Management of Chronic Issues    Diabetes    HPI:    Past Medical History:  Diagnosis Date   A-fib ([place])    a. Post-op afib after CABG 08/2012.   Acute gastric ulcer with hemorrhage    Acute respiratory failure with hypoxia (HCC)    Aphasia following cerebral infarction    CAD (coronary artery disease)    a. NSTEMI s/p stent to distal RCA 03/2000. b. Inf MI s/p emergent thrombectomy/stenting mid RCA 10/2000. c. NSTEMI s/p CABGx4 (LIMA-LAD, SVG-OM2, seq SVG-acute marginal and PD) 08/14/78 - course complicated by confusion, post-op AF, L pleural effusion with thoracentesis. d. Normal LV function by echo 08/2012.   Diverticulosis    DM with CKD    Dysphagia following cerebral infarction    Encephalopathy    Extended spectrum beta lactamase (ESBL) resistance    Generalized anxiety disorder    GERD (gastroesophageal reflux disease)    Hemiplegia and hemiparesis following cerebral infarction affecting right dominant side (HCC)    Hiatal hernia    HLD (hyperlipidemia)    HTN (hypertension)    Non-ST elevation (NSTEMI) myocardial infarction [facility])    Nontraumatic intracerebral hemorrhage in hemisphere, subcortical [facility])    Peripheral vascular disease ([place])    a. Carotid dopplers neg 08/13/12. b. Pre-cabg ABIs - R=0.87 suggesting mild dz, L=1.29 possibly falsely elevated due to calcified vessels.   Pleural effusion    a. L pleural eff after CABG s/p thoracentesis 08/22/12.   Prostate cancer [facility])    Status post radiation treatment.   Prostatic hypertrophy    a. Hx of urinary retention, awaiting TURP.   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 Mild  MR by TEE 08/2012.    Past  Surgical History:  Procedure Laterality Date   APPENDECTOMY     BACK SURGERY     CORONARY ARTERY BYPASS GRAFT  08/16/2012   Procedure: CORONARY ARTERY BYPASS GRAFTING (CABG);  Surgeon: Melrose Nakayama, MD;  Location: [place];  Service: Open Heart Surgery;  Laterality: N/A;   IR REPLC GASTRO/COLONIC TUBE PERCUT W/FLUORO  02/13/2019   LEFT HEART CATHETERIZATION WITH CORONARY ANGIOGRAM N/A 08/14/2012   Procedure: LEFT HEART CATHETERIZATION WITH CORONARY ANGIOGRAM;  Surgeon: Peter M Martinique, MD;  Location: [facility] CATH LAB;  Service: Cardiovascular;  Laterality: N/A;    Social History   Socioeconomic History   Marital status: Married    Spouse name: Not on file   Number of children: Not on file   Years of education: Not on file   Highest education level: Not on file  Occupational History   Occupation: retired   Tobacco Use   Smoking status: Former    Types: Cigarettes    Quit date: 11/12/1972    Years since quitting: 49.1   Smokeless tobacco: Never  Vaping Use   Vaping Use: Never used  Substance and Sexual Activity   Alcohol use: No   Drug use: No   Sexual activity: Not Currently  Other Topics Concern   Not on file  Social History Narrative   He is a long term patient of [facility]    Social Determinants  of Health   Financial Resource Strain: Not on file  Food Insecurity: Not on file  Transportation Needs: Not on file  Physical Activity: Not on file  Stress: Not on file  Social Connections: Not on file  Intimate Partner Violence: Not on file   Family History  Problem Relation Age of Onset   Hypertension Mother       VITAL SIGNS BP 129/76    Pulse 64    Temp 98.2 F (36.8 C)    Resp 18    Ht 5\' 11"  (1.803 m)    Wt 171 lb 6.4 oz (77.7 kg)    SpO2 95%    BMI 23.91 kg/m   Outpatient Encounter Medications as of 12/31/2021  Medication Sig   acetaminophen (TYLENOL) 325 MG tablet Take 650 mg by mouth every 4 (four) hours as needed. per standing order for pain/fever DO NOT EXCEED  >3,000 mg in 24 hours   Amino Acids-Protein Hydrolys (FEEDING SUPPLEMENT, PRO-STAT SUGAR FREE 64,) LIQD Take 30 mLs by mouth 3 (three) times daily with meals. for albumin 2.6   amLODipine (NORVASC) 5 MG tablet Take 5 mg by mouth daily.    apixaban (ELIQUIS) 5 MG TABS tablet Take 5 mg by mouth 2 (two) times daily.   atorvastatin (LIPITOR) 40 MG tablet Take 40 mg by mouth daily.   Baclofen 5 MG TABS Take 5 mg by mouth 3 (three) times daily. Nontraumatic intracerebral hemorrhage in hemisphere, subcortical   Balsam [place]-Castor Oil (VENELEX) OINT Apply topically. Special Instructions: Apply to sacrum, coccyx and bilateral buttocks qshift for prevention/ redness. Every Shift Day, Evening, Night   Eyelid Cleansers (OCUSOFT LID SCRUB EX) daily. Cleanse both eye lids   Glucerna (GLUCERNA) LIQD Take 237 mLs by mouth daily.   hydrocortisone cream 1 % Apply 1 application topically as needed (for hemorrhoids).   insulin glargine (LANTUS) 100 UNIT/ML injection Inject 20 Units into the skin daily.   insulin lispro (HUMALOG) 100 UNIT/ML cartridge Inject 10 Units into the skin 3 (three) times daily before meals. Give 10 units SQ with meals if Blood Sugar >150   lansoprazole (PREVACID SOLUTAB) 30 MG disintegrating tablet Take 30 mg by mouth daily. Give before meals   loperamide (IMODIUM A-D) 2 MG tablet Take 2 mg by mouth as needed for diarrhea or loose stools. May give another dose after each loose stool, up to 8 mg per 24 hours.   loratadine (CLARITIN) 10 MG tablet Take 10 mg by mouth daily.   losartan (COZAAR) 50 MG tablet Take 50 mg by mouth daily.   Melatonin 1 MG TABS Take 2 tablets by mouth at bedtime.    NON FORMULARY Diet Change: Regular, thin liquids   ondansetron (ZOFRAN-ODT) 4 MG disintegrating tablet Take 4 mg by mouth every 6 (six) hours as needed for nausea or vomiting.   oxybutynin (DITROPAN XL) 15 MG 24 hr tablet Take 15 mg by mouth daily.   polyethylene glycol (MIRALAX / GLYCOLAX) 17 g packet  Take 17 g by mouth daily as needed.   rOPINIRole (REQUIP) 0.25 MG tablet Take 0.25 mg by mouth at bedtime.   No facility-administered encounter medications on file as of 12/31/2021.     SIGNIFICANT DIAGNOSTIC EXAMS       ASSESSMENT/ PLAN:     Ok Edwards NP [facility]  Contact [phone] Monday through Friday 8am- 5pm  After hours call [phone]

## 2022-01-03 ENCOUNTER — Encounter: Payer: Self-pay | Admitting: Adult Health

## 2022-01-03 ENCOUNTER — Other Ambulatory Visit (HOSPITAL_COMMUNITY)
Admission: RE | Admit: 2022-01-03 | Discharge: 2022-01-03 | Disposition: A | Discharge status: Home / Self Care | Payer: Medicare PPO | Source: Skilled Nursing Facility | Attending: Adult Health | Admitting: Adult Health

## 2022-01-03 ENCOUNTER — Non-Acute Institutional Stay (SKILLED_NURSING_FACILITY): Payer: Medicare PPO | Admitting: Adult Health

## 2022-01-03 DIAGNOSIS — E1151 Type 2 diabetes mellitus with diabetic peripheral angiopathy without gangrene: Secondary | ICD-10-CM

## 2022-01-03 DIAGNOSIS — E1122 Type 2 diabetes mellitus with diabetic chronic kidney disease: Secondary | ICD-10-CM | POA: Diagnosis not present

## 2022-01-03 DIAGNOSIS — E1159 Type 2 diabetes mellitus with other circulatory complications: Secondary | ICD-10-CM | POA: Diagnosis not present

## 2022-01-03 DIAGNOSIS — N183 Chronic kidney disease, stage 3 unspecified: Secondary | ICD-10-CM | POA: Diagnosis not present

## 2022-01-03 DIAGNOSIS — E1169 Type 2 diabetes mellitus with other specified complication: Secondary | ICD-10-CM | POA: Insufficient documentation

## 2022-01-03 DIAGNOSIS — I152 Hypertension secondary to endocrine disorders: Secondary | ICD-10-CM

## 2022-01-03 LAB — COMPREHENSIVE METABOLIC PANEL
ALT: 35 U/L (ref 0–44)
AST: 31 U/L (ref 15–41)
Albumin: 3.2 g/dL — ABNORMAL LOW (ref 3.5–5.0)
Alkaline Phosphatase: 114 U/L (ref 38–126)
Anion gap: 7 (ref 5–15)
BUN: 54 mg/dL — ABNORMAL HIGH (ref 8–23)
CO2: 26 mmol/L (ref 22–32)
Calcium: 8.7 mg/dL — ABNORMAL LOW (ref 8.9–10.3)
Chloride: 105 mmol/L (ref 98–111)
Creatinine, Ser: 2.15 mg/dL — ABNORMAL HIGH (ref 0.61–1.24)
GFR, Estimated: 31 mL/min — ABNORMAL LOW (ref >60)
Glucose, Bld: 71 mg/dL (ref 70–99)
Potassium: 4.4 mmol/L (ref 3.5–5.1)
Sodium: 138 mmol/L (ref 135–145)
Total Bilirubin: 1 mg/dL (ref 0.3–1.2)
Total Protein: 6.5 g/dL (ref 6.5–8.1)

## 2022-01-03 LAB — HEMOGLOBIN A1C
Hgb A1c MFr Bld: 6.2 % — ABNORMAL HIGH (ref 4.8–5.6)
Mean Plasma Glucose: 131 mg/dL

## 2022-01-03 NOTE — Progress Notes (Signed)
This encounter was created in error - please disregard.

## 2022-01-03 NOTE — Progress Notes (Signed)
Location:  [place] Room Number: 761-Y Place of Service:  SNF (31)   CODE STATUS: DNR  No Known Allergies  Chief Complaint  Patient presents with   Acute Visit    Lab follow-up    HPI:  His renal failure is worsening. His creat is 2.15 which is slowly increasing. His presently taking cozaar 50 mg daily. He is taking humalog 10 units with meals; with an a1c of 6.2. there are no reports of changes in appetite; no nervousness; no excessive hunger or thirst.   Past Medical History:  Diagnosis Date   A-fib ([place])    a. Post-op afib after CABG 08/2012.   Acute gastric ulcer with hemorrhage    Acute respiratory failure with hypoxia (HCC)    Aphasia following cerebral infarction    CAD (coronary artery disease)    a. NSTEMI s/p stent to distal RCA 03/2000. b. Inf MI s/p emergent thrombectomy/stenting mid RCA 10/2000. c. NSTEMI s/p CABGx4 (LIMA-LAD, SVG-OM2, seq SVG-acute marginal and PD) 0/7/37 - course complicated by confusion, post-op AF, L pleural effusion with thoracentesis. d. Normal LV function by echo 08/2012.   Diverticulosis    DM with CKD    Dysphagia following cerebral infarction    Encephalopathy    Extended spectrum beta lactamase (ESBL) resistance    Generalized anxiety disorder    GERD (gastroesophageal reflux disease)    Hemiplegia and hemiparesis following cerebral infarction affecting right dominant side (HCC)    Hiatal hernia    HLD (hyperlipidemia)    HTN (hypertension)    Non-ST elevation (NSTEMI) myocardial infarction [facility])    Nontraumatic intracerebral hemorrhage in hemisphere, subcortical [facility])    Peripheral vascular disease ([place])    a. Carotid dopplers neg 08/13/12. b. Pre-cabg ABIs - R=0.87 suggesting mild dz, L=1.29 possibly falsely elevated due to calcified vessels.   Pleural effusion    a. L pleural eff after CABG s/p thoracentesis 08/22/12.   Prostate cancer [facility])    Status post radiation treatment.   Prostatic hypertrophy    a.  Hx of urinary retention, awaiting TURP.   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 Mild  MR by TEE 08/2012.    Past Surgical History:  Procedure Laterality Date   APPENDECTOMY     BACK SURGERY     CORONARY ARTERY BYPASS GRAFT  08/16/2012   Procedure: CORONARY ARTERY BYPASS GRAFTING (CABG);  Surgeon: Melrose Nakayama, MD;  Location: [place];  Service: Open Heart Surgery;  Laterality: N/A;   IR REPLC GASTRO/COLONIC TUBE PERCUT W/FLUORO  02/13/2019   LEFT HEART CATHETERIZATION WITH CORONARY ANGIOGRAM N/A 08/14/2012   Procedure: LEFT HEART CATHETERIZATION WITH CORONARY ANGIOGRAM;  Surgeon: Peter M Martinique, MD;  Location: [facility] CATH LAB;  Service: Cardiovascular;  Laterality: N/A;    Social History   Socioeconomic History   Marital status: Married    Spouse name: Not on file   Number of children: Not on file   Years of education: Not on file   Highest education level: Not on file  Occupational History   Occupation: retired   Tobacco Use   Smoking status: Former    Types: Cigarettes    Quit date: 11/12/1972    Years since quitting: 49.1   Smokeless tobacco: Never  Vaping Use   Vaping Use: Never used  Substance and  Sexual Activity   Alcohol use: No   Drug use: No   Sexual activity: Not Currently  Other Topics Concern   Not on file  Social History Narrative   He is a long term patient of [place]    Social Determinants of Health   Financial Resource Strain: Not on file  Food Insecurity: Not on file  Transportation Needs: Not on file  Physical Activity: Not on file  Stress: Not on file  Social Connections: Not on file  Intimate Partner Violence: Not on file   Family History  Problem Relation Age of Onset   Hypertension Mother       VITAL SIGNS BP 126/72    Pulse 62    Temp 98.2 F (36.8 C)    Resp 18    Ht _0  (1.803 m)    Wt 171 lb 6.4 oz (77.7 kg)     SpO2 95%    BMI 23.91 kg/m   Outpatient Encounter Medications as of 01/03/2022  Medication Sig   acetaminophen (TYLENOL) 325 MG tablet Take 650 mg by mouth every 4 (four) hours as needed. per standing order for pain/fever DO NOT EXCEED >3,000 mg in 24 hours   Amino Acids-Protein Hydrolys (FEEDING SUPPLEMENT, PRO-STAT SUGAR FREE 64,) LIQD Take 30 mLs by mouth 3 (three) times daily with meals. for albumin 2.6   amLODipine (NORVASC) 5 MG tablet Take 5 mg by mouth daily.    apixaban (ELIQUIS) 5 MG TABS tablet Take 5 mg by mouth 2 (two) times daily.   atorvastatin (LIPITOR) 40 MG tablet Take 40 mg by mouth daily.   Baclofen 5 MG TABS Take 5 mg by mouth 3 (three) times daily. Nontraumatic intracerebral hemorrhage in hemisphere, subcortical   Balsam [place]-Castor Oil (VENELEX) OINT Apply topically. Special Instructions: Apply to sacrum, coccyx and bilateral buttocks qshift for prevention/ redness. Every Shift Day, Evening, Night   Eyelid Cleansers (OCUSOFT LID SCRUB EX) daily. Cleanse both eye lids   Glucerna (GLUCERNA) LIQD Take 237 mLs by mouth daily.   hydrocortisone cream 1 % Apply 1 application topically as needed (for hemorrhoids).   insulin glargine (LANTUS) 100 UNIT/ML injection Inject 20 Units into the skin daily.   insulin lispro (HUMALOG) 100 UNIT/ML cartridge Inject 10 Units into the skin 3 (three) times daily before meals. Give 10 units SQ with meals if Blood Sugar >150   lansoprazole (PREVACID SOLUTAB) 30 MG disintegrating tablet Take 30 mg by mouth daily. Give before meals   loperamide (IMODIUM A-D) 2 MG tablet Take 2 mg by mouth as needed for diarrhea or loose stools. May give another dose after each loose stool, up to 8 mg per 24 hours.   loratadine (CLARITIN) 10 MG tablet Take 10 mg by mouth daily.   Melatonin 1 MG TABS Take 2 tablets by mouth at bedtime.    NON FORMULARY Diet Change: Regular, thin liquids   ondansetron (ZOFRAN-ODT) 4 MG disintegrating tablet Take 4 mg by mouth  every 6 (six) hours as needed for nausea or vomiting.   oxybutynin (DITROPAN XL) 15 MG 24 hr tablet Take 15 mg by mouth daily.   polyethylene glycol (MIRALAX / GLYCOLAX) 17 g packet Take 17 g by mouth daily as needed.   rOPINIRole (REQUIP) 0.25 MG tablet Take 0.25 mg by mouth at bedtime.   [DISCONTINUED] losartan (COZAAR) 50 MG tablet Take 50 mg by mouth daily.   No facility-administered encounter medications on file as of 01/03/2022.     SIGNIFICANT DIAGNOSTIC EXAMS  PREVIOUS   05-12-21: right lower extremity venous doppler: preliminary report: clot from groin to foot.   05-12-21: right lower extremity venous doppler (From ED):   1. Extensive acute mostly occlusive deep venous thrombosis extending from the right common femoral vein into the calf veins.  NO NEW EXAMS.    LABS REVIEWED PREVIOUS:    01-07-21: wbc 7.8; hgb 15.3; hct 47.7; mcv 93.7 plt 180; glucose 101; bun 45; creat 1.85; k+ 4.4; na++ 140; ca 8.6 GFR 37; liver normal albumin 2.8 chol 101; ldl 60; trig 77; hdl 26; hgb a1c 6.5; tsh 1.908 02-15-21: urine culture: multiple species  03-25-21 hgb a1c 6.6 05-11-21: urine for micro albumin 1299.7 (413.2) on ARB   05-12-21: wbc 10.6; hgb `15.2; hct 47.9; mcv 95.0 plt 181; glucose 233; bun 49; creat 1.98; k+ 4.4; na++ 137; ca 8.2; GFR34 05-14-21: glucose 116; bun 32; creat 1.68; k+ 4.1; na++ 139; ca 8.2; GFR 42 05-15-21: wbc 7.5; hgb 14.7; hct 44.6; mcv 89.7 plt 182  05-21-21: wbc 8.6; hgb 13.6; hct 42.9; mcv 93.5 plt 209; glucose 147; bun 35; creat 1.75; k+ 4.0 na++ 8.1; GFR 40 07-23-21: wbc 7.4; hgb 15.8; hct 50.3; mcv 94.7 plt 189; urine culture: enterococcus faecalis/providencia stuartii  08-09-21: wbc 8.3; hgb 14.5; hct 45.3; mcv 93.8 plt 192; glucose 127; bun 34; creat 1.83; k+ 4.1; na++ 140; ca 7.8; GFR 38; alk phos 130; albumin 2.6 urine culture: pseudomonas aeruginosa: cipro 09-09-21: hgb a1c 6.5; chol 95; ldl 58; trig 75; hdl 22; PSA 0.37 09-13-21: wbc 7.4; hgb 15.3; hct 48.5; mcv 94.0  plt 180; glucose 247; bun 43; creat 2.11; k+ 4.7; na++ 139; ca 8.3; GFR 32; d-dimer <0.27; CRP 2.34 09-29-21: wbc 8.0; hgb 14.9; hct 46.9; mcv 94.4 plt 170; glucose 168; bun 50; creat 2.04; k+ 4.5; na++ 134; ca 7.9; GFR 33; urine culture: no growth  12-07-21: urine culture: mixed bacteria  TODAY  01-03-22: glucose 71; bun 54; creat 2.15; k+ 4.4; na++ 138; ca 8.7; GFR 31; liver normal albumin 3.2; hgb a1c 6.2    Review of Systems  Reason unable to perform ROS: unable to fully participate.   Physical Exam Constitutional:      General: He is not in acute distress.    Appearance: He is well-developed. He is not diaphoretic.  Neck:     Thyroid: No thyromegaly.  Cardiovascular:     Rate and Rhythm: Normal rate and regular rhythm.     Pulses: Normal pulses.     Heart sounds: Normal heart sounds.  Pulmonary:     Effort: Pulmonary effort is normal. No respiratory distress.     Breath sounds: Normal breath sounds.  Abdominal:     General: Bowel sounds are normal. There is no distension.     Palpations: Abdomen is soft.     Tenderness: There is no abdominal tenderness.  Genitourinary:    Comments: Foley  Musculoskeletal:     Cervical back: Neck supple.     Right lower leg: No edema.     Left lower leg: No edema.     Comments: Right hemiplegia   Lymphadenopathy:     Cervical: No cervical adenopathy.  Skin:    General: Skin is warm and dry.  Neurological:     Mental Status: He is alert. Mental status is at baseline.  Psychiatric:        Mood and Affect: Mood normal.      ASSESSMENT/ PLAN:  TODAY  Hypertension associated with type  2 diabetes mellitus CKD stage 3 due to type 2 diabetes mellitus Type 2 diabetes mellitus with peripheral vascular disease  Will lower cozaar to 25 mg daily  Will lower lispro to 5 units with meals Will repeat bmp 01-10-22    Ok Edwards NP [facility]   call [phone]

## 2022-01-06 DIAGNOSIS — Z8616 Personal history of COVID-19: Secondary | ICD-10-CM | POA: Diagnosis not present

## 2022-01-06 DIAGNOSIS — Z1159 Encounter for screening for other viral diseases: Secondary | ICD-10-CM | POA: Diagnosis not present

## 2022-01-06 DIAGNOSIS — I610 Nontraumatic intracerebral hemorrhage in hemisphere, subcortical: Secondary | ICD-10-CM | POA: Diagnosis not present

## 2022-01-10 ENCOUNTER — Other Ambulatory Visit (HOSPITAL_COMMUNITY)
Admission: RE | Admit: 2022-01-10 | Discharge: 2022-01-10 | Disposition: A | Discharge status: Home / Self Care | Payer: Medicare PPO | Source: Skilled Nursing Facility | Attending: Adult Health | Admitting: Adult Health

## 2022-01-10 DIAGNOSIS — I1310 Hypertensive heart and chronic kidney disease without heart failure, with stage 1 through stage 4 chronic kidney disease, or unspecified chronic kidney disease: Secondary | ICD-10-CM | POA: Diagnosis not present

## 2022-01-10 LAB — BASIC METABOLIC PANEL
Anion gap: 4 — ABNORMAL LOW (ref 5–15)
BUN: 49 mg/dL — ABNORMAL HIGH (ref 8–23)
CO2: 27 mmol/L (ref 22–32)
Calcium: 8.7 mg/dL — ABNORMAL LOW (ref 8.9–10.3)
Chloride: 110 mmol/L (ref 98–111)
Creatinine, Ser: 1.9 mg/dL — ABNORMAL HIGH (ref 0.61–1.24)
GFR, Estimated: 36 mL/min — ABNORMAL LOW (ref >60)
Glucose, Bld: 89 mg/dL (ref 70–99)
Potassium: 4.2 mmol/L (ref 3.5–5.1)
Sodium: 141 mmol/L (ref 135–145)

## 2022-01-10 NOTE — Progress Notes (Signed)
History of Present Illness:   Patient has had urinary retention since his CVA in 2019.  He has an indwelling catheter that is changed every month at the [facility].  There is also a remote history of prostate cancer.  He completed EBRT at [facility] in [place] many years ago.  Last PSA was in 2022 and was 0.3.  Went to the emergency room in October, on the 19th with gross hematuria.  Catheter draining well.  Urine culture grew MRSA.  Prior to culture result the patient was placed on Cipro.  I do not know if he was placed on a different medication following culture result.  He is on Eliquis.  He is also on oxybutynin 15 mg daily.  He still has occasional leakage around the catheter.  1.23.2023: Recently here for routine check.  He is coming back, apparently sent because of questions about his traumatic hypospadias (long-term indwelling Foley catheter within his penis).  He also has leakage around his catheter fairly often.  He is on oxybutynin for that.  Past Medical History:  Diagnosis Date   A-fib [facility])    a. Post-op afib after CABG 08/2012.   Acute gastric ulcer with hemorrhage    Acute respiratory failure with hypoxia (HCC)    Aphasia following cerebral infarction    CAD (coronary artery disease)    a. NSTEMI s/p stent to distal RCA 03/2000. b. Inf MI s/p emergent thrombectomy/stenting mid RCA 10/2000. c. NSTEMI s/p CABGx4 (LIMA-LAD, SVG-OM2, seq SVG-acute marginal and PD) 08/16/61 - course complicated by confusion, post-op AF, L pleural effusion with thoracentesis. d. Normal LV function by echo 08/2012.   Diverticulosis    DM with CKD    Dysphagia following cerebral infarction    Encephalopathy    Extended spectrum beta lactamase (ESBL) resistance    Generalized anxiety disorder    GERD (gastroesophageal reflux disease)    Hemiplegia and hemiparesis following cerebral infarction affecting right dominant side (HCC)    Hiatal hernia    HLD (hyperlipidemia)    HTN  (hypertension)    Non-ST elevation (NSTEMI) myocardial infarction [facility])    Nontraumatic intracerebral hemorrhage in hemisphere, subcortical [facility])    Peripheral vascular disease ([place])    a. Carotid dopplers neg 08/13/12. b. Pre-cabg ABIs - R=0.87 suggesting mild dz, L=1.29 possibly falsely elevated due to calcified vessels.   Pleural effusion    a. L pleural eff after CABG s/p thoracentesis 08/22/12.   Prostate cancer [facility])    Status post radiation treatment.   Prostatic hypertrophy    a. Hx of urinary retention, awaiting TURP.   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 Mild  MR by TEE 08/2012.    Past Surgical History:  Procedure Laterality Date   APPENDECTOMY     BACK SURGERY     CORONARY ARTERY BYPASS GRAFT  08/16/2012   Procedure: CORONARY ARTERY BYPASS GRAFTING (CABG);  Surgeon: Melrose Nakayama, MD;  Location: [place];  Service: Open Heart Surgery;  Laterality: N/A;   IR REPLC GASTRO/COLONIC TUBE PERCUT W/FLUORO  02/13/2019   LEFT HEART CATHETERIZATION WITH CORONARY ANGIOGRAM N/A 08/14/2012   Procedure: LEFT HEART CATHETERIZATION WITH CORONARY ANGIOGRAM;  Surgeon: Peter M Martinique, MD;  Location: [facility] CATH LAB;  Service: Cardiovascular;  Laterality: N/A;    Home Medications:  Allergies as of  01/11/2022   No Known Allergies      Medication List      Notice   This visit is during an admission. Changes to the med list made in this visit will be reflected in the After Visit Summary of the admission.     Allergies: No Known Allergies  Family History  Problem Relation Age of Onset   Hypertension Mother     Social History:  reports that he quit smoking about 49 years ago. His smoking use included cigarettes. He has never used smokeless tobacco. He reports that he does not drink alcohol and does not use drugs.  ROS: A complete review of systems was  performed.  All systems are negative except for pertinent findings as noted.  Physical Exam:  Vital signs in last 24 hours: There were no vitals taken for this visit. Constitutional:  Alert and oriented, No acute distress Cardiovascular: Regular rate  Respiratory: Normal respiratory effort GI: Abdomen is soft, nontender, nondistended, no abdominal masses.  Mildly obese. Genitourinary: Circumcised male phallus, traumatic hypospadias most of the way ventrally in the penile shaft.  Testes are descended bilaterally and non-tender and without masses, scrotum is normal in appearance without lesions or masses, perineum is normal on inspection. Psychiatric: Normal mood and affect  I have reviewed prior pt notes  I have reviewed notes from referring/previous physicians-recent physicians note reviewed.  I have reviewed prior PSA results    Impression/Assessment:  Urinary retention, long-term Foley catheter in place  Traumatic hypospadias secondary to traction of catheter.  This is not currently a problem to the patient  Urinary incontinence around the catheter, most like for bladder spasms.  On antimuscarinic therapy.  Plan:  I do not think he is a candidate for suprapubic tube placement.  Certainly, there is no need for reconstructive procedure for his hypospadiac meatus  I would recommend continued catheterization through his penis.  He is not bothered by this hypospadias  Continue monthly catheter changes at the [facility] visit in a year  His wife was in attendance today and agrees with the above plan.

## 2022-01-11 ENCOUNTER — Encounter: Payer: Self-pay | Admitting: Urology

## 2022-01-11 ENCOUNTER — Ambulatory Visit (INDEPENDENT_AMBULATORY_CARE_PROVIDER_SITE_OTHER): Payer: Medicare PPO | Admitting: Urology

## 2022-01-11 ENCOUNTER — Other Ambulatory Visit: Payer: Self-pay

## 2022-01-11 VITALS — BP 127/73 | HR 72

## 2022-01-11 DIAGNOSIS — R339 Retention of urine, unspecified: Secondary | ICD-10-CM

## 2022-01-11 DIAGNOSIS — N3289 Other specified disorders of bladder: Secondary | ICD-10-CM | POA: Diagnosis not present

## 2022-01-11 DIAGNOSIS — N401 Enlarged prostate with lower urinary tract symptoms: Secondary | ICD-10-CM | POA: Diagnosis not present

## 2022-01-11 DIAGNOSIS — Q549 Hypospadias, unspecified: Secondary | ICD-10-CM

## 2022-01-11 NOTE — Progress Notes (Signed)

## 2022-01-13 DIAGNOSIS — I610 Nontraumatic intracerebral hemorrhage in hemisphere, subcortical: Secondary | ICD-10-CM | POA: Diagnosis not present

## 2022-01-13 DIAGNOSIS — Z1159 Encounter for screening for other viral diseases: Secondary | ICD-10-CM | POA: Diagnosis not present

## 2022-01-20 DIAGNOSIS — I610 Nontraumatic intracerebral hemorrhage in hemisphere, subcortical: Secondary | ICD-10-CM | POA: Diagnosis not present

## 2022-01-20 DIAGNOSIS — Z1159 Encounter for screening for other viral diseases: Secondary | ICD-10-CM | POA: Diagnosis not present

## 2022-01-21 ENCOUNTER — Encounter: Payer: Self-pay | Admitting: Adult Health

## 2022-01-21 ENCOUNTER — Non-Acute Institutional Stay (SKILLED_NURSING_FACILITY): Payer: Medicare PPO | Admitting: Adult Health

## 2022-01-21 DIAGNOSIS — E1129 Type 2 diabetes mellitus with other diabetic kidney complication: Secondary | ICD-10-CM

## 2022-01-21 DIAGNOSIS — R809 Proteinuria, unspecified: Secondary | ICD-10-CM

## 2022-01-21 DIAGNOSIS — I739 Peripheral vascular disease, unspecified: Secondary | ICD-10-CM

## 2022-01-21 DIAGNOSIS — I700 Atherosclerosis of aorta: Secondary | ICD-10-CM | POA: Diagnosis not present

## 2022-01-21 DIAGNOSIS — G8111 Spastic hemiplegia affecting right dominant side: Secondary | ICD-10-CM

## 2022-01-21 NOTE — Progress Notes (Signed)
Location:  [facility] [place] Room Number: 811-X Place of Service:  SNF (31)   CODE STATUS: DNR  No Known Allergies  Chief Complaint  Patient presents with   Acute Visit    Care plan meeting    HPI:  We have come together for his care plan meeting. Family present. BIMS 4/15; mood 0/30. He requires extensive to dependent assist with his adls. He has a long term foley in place. Is incontinent of bowel. He does get out of bed daily; no recent falls. Dietary: weight stable at 171 pounds; cbg readings are stable. His mood state is stable. Therapy: none at this time. He continues to be followed for his chronic illnesses including:   Aortic atherosclerosis  Peripheral vascular disease  Right spastic hemiplegia Microalbuminemia due to type 2 diabetes mellitus  Past Medical History:  Diagnosis Date   A-fib ([place])    a. Post-op afib after CABG 08/2012.   Acute gastric ulcer with hemorrhage    Acute respiratory failure with hypoxia (HCC)    Aphasia following cerebral infarction    CAD (coronary artery disease)    a. NSTEMI s/p stent to distal RCA 03/2000. b. Inf MI s/p emergent thrombectomy/stenting mid RCA 10/2000. c. NSTEMI s/p CABGx4 (LIMA-LAD, SVG-OM2, seq SVG-acute marginal and PD) 06/12/61 - course complicated by confusion, post-op AF, L pleural effusion with thoracentesis. d. Normal LV function by echo 08/2012.   Diverticulosis    DM with CKD    Dysphagia following cerebral infarction    Encephalopathy    Extended spectrum beta lactamase (ESBL) resistance    Generalized anxiety disorder    GERD (gastroesophageal reflux disease)    Hemiplegia and hemiparesis following cerebral infarction affecting right dominant side (HCC)    Hiatal hernia    HLD (hyperlipidemia)    HTN (hypertension)    Non-ST elevation (NSTEMI) myocardial infarction [facility])    Nontraumatic intracerebral hemorrhage in hemisphere, subcortical [facility])    Peripheral vascular disease ([place])    a. Carotid  dopplers neg 08/13/12. b. Pre-cabg ABIs - R=0.87 suggesting mild dz, L=1.29 possibly falsely elevated due to calcified vessels.   Pleural effusion    a. L pleural eff after CABG s/p thoracentesis 08/22/12.   Prostate cancer [facility])    Status post radiation treatment.   Prostatic hypertrophy    a. Hx of urinary retention, awaiting TURP.   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 Mild  MR by TEE 08/2012.    Past Surgical History:  Procedure Laterality Date   APPENDECTOMY     BACK SURGERY     CORONARY ARTERY BYPASS GRAFT  08/16/2012   Procedure: CORONARY ARTERY BYPASS GRAFTING (CABG);  Surgeon: Melrose Nakayama, MD;  Location: [place];  Service: Open Heart Surgery;  Laterality: N/A;   IR REPLC GASTRO/COLONIC TUBE PERCUT W/FLUORO  02/13/2019   LEFT HEART CATHETERIZATION WITH CORONARY ANGIOGRAM N/A 08/14/2012   Procedure: LEFT HEART CATHETERIZATION WITH CORONARY ANGIOGRAM;  Surgeon: Peter M Martinique, MD;  Location: [facility] CATH LAB;  Service: Cardiovascular;  Laterality: N/A;    Social History   Socioeconomic History   Marital status: Married    Spouse name: Not on file   Number of children: Not on file   Years of education: Not on file   Highest education level: Not on file  Occupational History  Occupation: retired   Tobacco Use   Smoking status: Former    Types: Cigarettes    Quit date: 11/12/1972    Years since quitting: 49.2   Smokeless tobacco: Never  Vaping Use   Vaping Use: Never used  Substance and Sexual Activity   Alcohol use: No   Drug use: No   Sexual activity: Not Currently  Other Topics Concern   Not on file  Social History Narrative   He is a long term patient of [facility]    Social Determinants of Health   Financial Resource Strain: Not on file  Food Insecurity: Not on file  Transportation Needs: Not on file  Physical Activity: Not on file   Stress: Not on file  Social Connections: Not on file  Intimate Partner Violence: Not on file   Family History  Problem Relation Age of Onset   Hypertension Mother       VITAL SIGNS BP 126/77    Pulse 66    Temp 98.2 F (36.8 C)    Resp 18    Ht 5' 11"  (1.803 m)    Wt 171 lb 12.8 oz (77.9 kg)    SpO2 95%    BMI 23.96 kg/m   Outpatient Encounter Medications as of 01/21/2022  Medication Sig   acetaminophen (TYLENOL) 325 MG tablet Take 650 mg by mouth every 4 (four) hours as needed. per standing order for pain/fever DO NOT EXCEED >3,000 mg in 24 hours   Amino Acids-Protein Hydrolys (FEEDING SUPPLEMENT, PRO-STAT SUGAR FREE 64,) LIQD Take 30 mLs by mouth 3 (three) times daily with meals. for albumin 2.6   amLODipine (NORVASC) 5 MG tablet Take 5 mg by mouth daily.    apixaban (ELIQUIS) 5 MG TABS tablet Take 5 mg by mouth 2 (two) times daily.   atorvastatin (LIPITOR) 40 MG tablet Take 40 mg by mouth daily.   Baclofen 5 MG TABS Take 5 mg by mouth 3 (three) times daily. Nontraumatic intracerebral hemorrhage in hemisphere, subcortical   Balsam [place]-Castor Oil (VENELEX) OINT Apply topically. Special Instructions: Apply to sacrum, coccyx and bilateral buttocks qshift for prevention/ redness. Every Shift Day, Evening, Night   Eyelid Cleansers (OCUSOFT LID SCRUB EX) daily. Cleanse both eye lids   Glucerna (GLUCERNA) LIQD Take 237 mLs by mouth daily.   hydrocortisone cream 1 % Apply 1 application topically as needed (for hemorrhoids).   insulin glargine (LANTUS) 100 UNIT/ML injection Inject 20 Units into the skin daily.   insulin lispro (HUMALOG) 100 UNIT/ML cartridge Inject 10 Units into the skin 3 (three) times daily before meals. Give 10 units SQ with meals if Blood Sugar >150   lansoprazole (PREVACID SOLUTAB) 30 MG disintegrating tablet Take 30 mg by mouth daily. Give before meals   loperamide (IMODIUM A-D) 2 MG tablet Take 2 mg by mouth as needed for diarrhea or loose stools. May give another  dose after each loose stool, up to 8 mg per 24 hours.   loratadine (CLARITIN) 10 MG tablet Take 10 mg by mouth daily.   losartan (COZAAR) 25 MG tablet Take 25 mg by mouth daily. DX: Hypertensive heart and chronic kidney disease without heart failure, with stage 1 through stage 4 chronic kidney disease, or unspecified chronic kidney disease-per NP note, code updated   Melatonin 1 MG TABS Take 2 tablets by mouth at bedtime.    NON FORMULARY Diet Change: Regular, thin liquids   ondansetron (ZOFRAN-ODT) 4 MG disintegrating tablet Take 4 mg by mouth every 6 (six)  hours as needed for nausea or vomiting.   oxybutynin (DITROPAN XL) 15 MG 24 hr tablet Take 15 mg by mouth daily.   polyethylene glycol (MIRALAX / GLYCOLAX) 17 g packet Take 17 g by mouth daily as needed.   rOPINIRole (REQUIP) 0.25 MG tablet Take 0.25 mg by mouth at bedtime.   No facility-administered encounter medications on file as of 01/21/2022.     SIGNIFICANT DIAGNOSTIC EXAMS  PREVIOUS   05-12-21: right lower extremity venous doppler: preliminary report: clot from groin to foot.   05-12-21: right lower extremity venous doppler (From ED):   1. Extensive acute mostly occlusive deep venous thrombosis extending from the right common femoral vein into the calf veins.  NO NEW EXAMS.    LABS REVIEWED PREVIOUS:    02-15-21: urine culture: multiple species  03-25-21 hgb a1c 6.6 05-11-21: urine for micro albumin 1299.7 (413.2) on ARB   05-12-21: wbc 10.6; hgb `15.2; hct 47.9; mcv 95.0 plt 181; glucose 233; bun 49; creat 1.98; k+ 4.4; na++ 137; ca 8.2; GFR34 05-14-21: glucose 116; bun 32; creat 1.68; k+ 4.1; na++ 139; ca 8.2; GFR 42 05-15-21: wbc 7.5; hgb 14.7; hct 44.6; mcv 89.7 plt 182  05-21-21: wbc 8.6; hgb 13.6; hct 42.9; mcv 93.5 plt 209; glucose 147; bun 35; creat 1.75; k+ 4.0 na++ 8.1; GFR 40 07-23-21: wbc 7.4; hgb 15.8; hct 50.3; mcv 94.7 plt 189; urine culture: enterococcus faecalis/providencia stuartii  08-09-21: wbc 8.3; hgb 14.5; hct  45.3; mcv 93.8 plt 192; glucose 127; bun 34; creat 1.83; k+ 4.1; na++ 140; ca 7.8; GFR 38; alk phos 130; albumin 2.6 urine culture: pseudomonas aeruginosa: cipro 09-09-21: hgb a1c 6.5; chol 95; ldl 58; trig 75; hdl 22; PSA 0.37 09-13-21: wbc 7.4; hgb 15.3; hct 48.5; mcv 94.0 plt 180; glucose 247; bun 43; creat 2.11; k+ 4.7; na++ 139; ca 8.3; GFR 32; d-dimer <0.27; CRP 2.34 09-29-21: wbc 8.0; hgb 14.9; hct 46.9; mcv 94.4 plt 170; glucose 168; bun 50; creat 2.04; k+ 4.5; na++ 134; ca 7.9; GFR 33; urine culture: no growth  12-07-21: urine culture: mixed bacteria 01-03-22: glucose 71; bun 54; creat 2.15; k+ 4.4; na++ 138; ca 8.7; GFR 31; liver normal albumin 3.2; hgb a1c 6.2   NO NEW LABS.   Review of Systems  Reason unable to perform ROS: unable to participate.    Physical Exam Constitutional:      General: He is not in acute distress.    Appearance: He is well-developed. He is not diaphoretic.  Neck:     Thyroid: No thyromegaly.  Cardiovascular:     Rate and Rhythm: Normal rate and regular rhythm.     Pulses: Normal pulses.     Heart sounds: Normal heart sounds.  Pulmonary:     Effort: Pulmonary effort is normal. No respiratory distress.     Breath sounds: Normal breath sounds.  Abdominal:     General: Bowel sounds are normal. There is no distension.     Palpations: Abdomen is soft.     Tenderness: There is no abdominal tenderness.  Genitourinary:    Comments: Foley  Musculoskeletal:     Cervical back: Neck supple.     Right lower leg: No edema.     Left lower leg: No edema.     Comments: Right hemiplegia   Lymphadenopathy:     Cervical: No cervical adenopathy.  Skin:    General: Skin is warm and dry.  Neurological:     Mental Status: He is alert.  Mental status is at baseline.  Psychiatric:        Mood and Affect: Mood normal.     ASSESSMENT/ PLAN:  TODAY  Aortic atherosclerosis Peripheral vascular disease Right spastic hemiplegia Microalbuminemia due to type 2  diabetes mellitus  Will continue current medications Will continue current plan of care Will continue to monitor his status.    Time spent with patient: 40 minutes: medications; behaviors; plan of care.    Ok Edwards NP [facility] Adult Medicine  call [phone]

## 2022-01-25 ENCOUNTER — Encounter: Payer: Self-pay | Admitting: Adult Health

## 2022-01-25 ENCOUNTER — Non-Acute Institutional Stay (SKILLED_NURSING_FACILITY): Payer: Medicare PPO | Admitting: Adult Health

## 2022-01-25 DIAGNOSIS — E1151 Type 2 diabetes mellitus with diabetic peripheral angiopathy without gangrene: Secondary | ICD-10-CM | POA: Diagnosis not present

## 2022-01-25 NOTE — Progress Notes (Signed)
Location:  [place] of Service:      CODE STATUS: dnr   No Known Allergies  Chief Complaint  Patient presents with   Acute Visit    Diabetes management     HPI:  He has had some cbg readings under 100 at HS. There are no reports of excessive hunger or thirst.   Past Medical History:  Diagnosis Date   A-fib ([place])    a. Post-op afib after CABG 08/2012.   Acute gastric ulcer with hemorrhage    Acute respiratory failure with hypoxia (HCC)    Aphasia following cerebral infarction    CAD (coronary artery disease)    a. NSTEMI s/p stent to distal RCA 03/2000. b. Inf MI s/p emergent thrombectomy/stenting mid RCA 10/2000. c. NSTEMI s/p CABGx4 (LIMA-LAD, SVG-OM2, seq SVG-acute marginal and PD) 03/16/61 - course complicated by confusion, post-op AF, L pleural effusion with thoracentesis. d. Normal LV function by echo 08/2012.   Diverticulosis    DM with CKD    Dysphagia following cerebral infarction    Encephalopathy    Extended spectrum beta lactamase (ESBL) resistance    Generalized anxiety disorder    GERD (gastroesophageal reflux disease)    Hemiplegia and hemiparesis following cerebral infarction affecting right dominant side (HCC)    Hiatal hernia    HLD (hyperlipidemia)    HTN (hypertension)    Non-ST elevation (NSTEMI) myocardial infarction [facility])    Nontraumatic intracerebral hemorrhage in hemisphere, subcortical [facility])    Peripheral vascular disease ([place])    a. Carotid dopplers neg 08/13/12. b. Pre-cabg ABIs - R=0.87 suggesting mild dz, L=1.29 possibly falsely elevated due to calcified vessels.   Pleural effusion    a. L pleural eff after CABG s/p thoracentesis 08/22/12.   Prostate cancer [facility])    Status post radiation treatment.   Prostatic hypertrophy    a. Hx of urinary retention, awaiting TURP.   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 Mild  MR by TEE 08/2012.    Past Surgical History:  Procedure Laterality Date   APPENDECTOMY     BACK SURGERY     CORONARY ARTERY BYPASS GRAFT  08/16/2012   Procedure: CORONARY ARTERY BYPASS GRAFTING (CABG);  Surgeon: Melrose Nakayama, MD;  Location: [place];  Service: Open Heart Surgery;  Laterality: N/A;   IR REPLC GASTRO/COLONIC TUBE PERCUT W/FLUORO  02/13/2019   LEFT HEART CATHETERIZATION WITH CORONARY ANGIOGRAM N/A 08/14/2012   Procedure: LEFT HEART CATHETERIZATION WITH CORONARY ANGIOGRAM;  Surgeon: Peter M Martinique, MD;  Location: [facility] CATH LAB;  Service: Cardiovascular;  Laterality: N/A;    Social History   Socioeconomic History   Marital status: Married    Spouse name: Not on file   Number of children: Not on file   Years of education: Not on file   Highest education level: Not on file  Occupational History   Occupation: retired   Tobacco Use   Smoking status: Former    Types: Cigarettes    Quit date: 11/12/1972    Years since quitting: 49.2   Smokeless tobacco: Never  Vaping Use   Vaping Use: Never used  Substance and Sexual Activity   Alcohol use: No   Drug use: No   Sexual activity: Not Currently  Other Topics Concern   Not on file  Social History  Narrative   He is a long term patient of [facility]    Social Determinants of Health   Financial Resource Strain: Not on file  Food Insecurity: Not on file  Transportation Needs: Not on file  Physical Activity: Not on file  Stress: Not on file  Social Connections: Not on file  Intimate Partner Violence: Not on file   Family History  Problem Relation Age of Onset   Hypertension Mother       VITAL SIGNS BP 124/73    Pulse 70    Temp 98 F (36.7 C)    Resp 18    Ht 5' 11"  (1.803 m)    Wt 171 lb 12.8 oz (77.9 kg)    BMI 23.96 kg/m   Outpatient Encounter Medications as of 01/25/2022  Medication Sig   acetaminophen (TYLENOL) 325 MG tablet Take 650 mg by mouth every 4 (four) hours as needed. per standing  order for pain/fever DO NOT EXCEED >3,000 mg in 24 hours   Amino Acids-Protein Hydrolys (FEEDING SUPPLEMENT, PRO-STAT SUGAR FREE 64,) LIQD Take 30 mLs by mouth 3 (three) times daily with meals. for albumin 2.6   amLODipine (NORVASC) 5 MG tablet Take 5 mg by mouth daily.    apixaban (ELIQUIS) 5 MG TABS tablet Take 5 mg by mouth 2 (two) times daily.   atorvastatin (LIPITOR) 40 MG tablet Take 40 mg by mouth daily.   Baclofen 5 MG TABS Take 5 mg by mouth 3 (three) times daily. Nontraumatic intracerebral hemorrhage in hemisphere, subcortical   Balsam [place]-Castor Oil (VENELEX) OINT Apply topically. Special Instructions: Apply to sacrum, coccyx and bilateral buttocks qshift for prevention/ redness. Every Shift Day, Evening, Night   Eyelid Cleansers (OCUSOFT LID SCRUB EX) daily. Cleanse both eye lids   Glucerna (GLUCERNA) LIQD Take 237 mLs by mouth daily.   hydrocortisone cream 1 % Apply 1 application topically as needed (for hemorrhoids).   insulin glargine (LANTUS) 100 UNIT/ML injection Inject 20 Units into the skin daily.   insulin lispro (HUMALOG) 100 UNIT/ML cartridge Inject 10 Units into the skin 3 (three) times daily before meals. Give 10 units SQ with meals if Blood Sugar >150   lansoprazole (PREVACID SOLUTAB) 30 MG disintegrating tablet Take 30 mg by mouth daily. Give before meals   loperamide (IMODIUM A-D) 2 MG tablet Take 2 mg by mouth as needed for diarrhea or loose stools. May give another dose after each loose stool, up to 8 mg per 24 hours.   loratadine (CLARITIN) 10 MG tablet Take 10 mg by mouth daily.   losartan (COZAAR) 25 MG tablet Take 25 mg by mouth daily. DX: Hypertensive heart and chronic kidney disease without heart failure, with stage 1 through stage 4 chronic kidney disease, or unspecified chronic kidney disease-per NP note, code updated   Melatonin 1 MG TABS Take 2 tablets by mouth at bedtime.    NON FORMULARY Diet Change: Regular, thin liquids   ondansetron (ZOFRAN-ODT) 4 MG  disintegrating tablet Take 4 mg by mouth every 6 (six) hours as needed for nausea or vomiting.   oxybutynin (DITROPAN XL) 15 MG 24 hr tablet Take 15 mg by mouth daily.   polyethylene glycol (MIRALAX / GLYCOLAX) 17 g packet Take 17 g by mouth daily as needed.   rOPINIRole (REQUIP) 0.25 MG tablet Take 0.25 mg by mouth at bedtime.   No facility-administered encounter medications on file as of 01/25/2022.     SIGNIFICANT DIAGNOSTIC EXAMS  PREVIOUS   05-12-21: right lower extremity  venous doppler: preliminary report: clot from groin to foot.   05-12-21: right lower extremity venous doppler (From ED):   1. Extensive acute mostly occlusive deep venous thrombosis extending from the right common femoral vein into the calf veins.  NO NEW EXAMS.    LABS REVIEWED PREVIOUS:    02-15-21: urine culture: multiple species  03-25-21 hgb a1c 6.6 05-11-21: urine for micro albumin 1299.7 (413.2) on ARB   05-12-21: wbc 10.6; hgb `15.2; hct 47.9; mcv 95.0 plt 181; glucose 233; bun 49; creat 1.98; k+ 4.4; na++ 137; ca 8.2; GFR34 05-14-21: glucose 116; bun 32; creat 1.68; k+ 4.1; na++ 139; ca 8.2; GFR 42 05-15-21: wbc 7.5; hgb 14.7; hct 44.6; mcv 89.7 plt 182  05-21-21: wbc 8.6; hgb 13.6; hct 42.9; mcv 93.5 plt 209; glucose 147; bun 35; creat 1.75; k+ 4.0 na++ 8.1; GFR 40 07-23-21: wbc 7.4; hgb 15.8; hct 50.3; mcv 94.7 plt 189; urine culture: enterococcus faecalis/providencia stuartii  08-09-21: wbc 8.3; hgb 14.5; hct 45.3; mcv 93.8 plt 192; glucose 127; bun 34; creat 1.83; k+ 4.1; na++ 140; ca 7.8; GFR 38; alk phos 130; albumin 2.6 urine culture: pseudomonas aeruginosa: cipro 09-09-21: hgb a1c 6.5; chol 95; ldl 58; trig 75; hdl 22; PSA 0.37 09-13-21: wbc 7.4; hgb 15.3; hct 48.5; mcv 94.0 plt 180; glucose 247; bun 43; creat 2.11; k+ 4.7; na++ 139; ca 8.3; GFR 32; d-dimer <0.27; CRP 2.34 09-29-21: wbc 8.0; hgb 14.9; hct 46.9; mcv 94.4 plt 170; glucose 168; bun 50; creat 2.04; k+ 4.5; na++ 134; ca 7.9; GFR 33; urine culture: no  growth  12-07-21: urine culture: mixed bacteria 01-03-22: glucose 71; bun 54; creat 2.15; k+ 4.4; na++ 138; ca 8.7; GFR 31; liver normal albumin 3.2; hgb a1c 6.2   NO NEW LABS.   Review of Systems  Reason unable to perform ROS: aphasia; unable to fully participate.    Physical Exam Constitutional:      General: He is not in acute distress.    Appearance: He is well-developed. He is not diaphoretic.  Neck:     Thyroid: No thyromegaly.  Cardiovascular:     Rate and Rhythm: Normal rate and regular rhythm.     Pulses: Normal pulses.     Heart sounds: Normal heart sounds.  Pulmonary:     Effort: Pulmonary effort is normal. No respiratory distress.     Breath sounds: Normal breath sounds.  Abdominal:     General: Bowel sounds are normal. There is no distension.     Palpations: Abdomen is soft.     Tenderness: There is no abdominal tenderness.  Genitourinary:    Comments: Foley  Musculoskeletal:     Cervical back: Neck supple.     Right lower leg: No edema.     Left lower leg: No edema.     Comments: Right hemiplegia  Lymphadenopathy:     Cervical: No cervical adenopathy.  Skin:    General: Skin is warm and dry.  Neurological:     Mental Status: He is alert. Mental status is at baseline.  Psychiatric:        Mood and Affect: Mood normal.     ASSESSMENT/ PLAN:  TODAY  Type 2 diabetes mellitus with peripheral vascular disease:   Will stop supper time insulin and will monitor his status.    Ok Edwards NP [facility] Adult Medicine  call [phone]

## 2022-01-27 DIAGNOSIS — I610 Nontraumatic intracerebral hemorrhage in hemisphere, subcortical: Secondary | ICD-10-CM | POA: Diagnosis not present

## 2022-01-27 DIAGNOSIS — Z1159 Encounter for screening for other viral diseases: Secondary | ICD-10-CM | POA: Diagnosis not present

## 2022-01-28 ENCOUNTER — Encounter: Payer: Self-pay | Admitting: Adult Health

## 2022-01-28 ENCOUNTER — Non-Acute Institutional Stay (SKILLED_NURSING_FACILITY): Payer: Medicare PPO | Admitting: Adult Health

## 2022-01-28 DIAGNOSIS — R809 Proteinuria, unspecified: Secondary | ICD-10-CM

## 2022-01-28 DIAGNOSIS — E1129 Type 2 diabetes mellitus with other diabetic kidney complication: Secondary | ICD-10-CM | POA: Diagnosis not present

## 2022-01-28 DIAGNOSIS — F323 Major depressive disorder, single episode, severe with psychotic features: Secondary | ICD-10-CM | POA: Diagnosis not present

## 2022-01-28 DIAGNOSIS — E785 Hyperlipidemia, unspecified: Secondary | ICD-10-CM | POA: Diagnosis not present

## 2022-01-28 DIAGNOSIS — N183 Chronic kidney disease, stage 3 unspecified: Secondary | ICD-10-CM | POA: Diagnosis not present

## 2022-01-28 DIAGNOSIS — E1169 Type 2 diabetes mellitus with other specified complication: Secondary | ICD-10-CM

## 2022-01-28 DIAGNOSIS — E1122 Type 2 diabetes mellitus with diabetic chronic kidney disease: Secondary | ICD-10-CM

## 2022-01-28 NOTE — Progress Notes (Signed)
Location:  [place] Room Number: 275-T Place of Service:  SNF (31)   CODE STATUS: DNR  No Known Allergies  Chief Complaint  Patient presents with   Medical Management of Chronic Issues           Dyslipidemia associated with type 2 diabetes mellitus; Micro-albuminuria due to type 2 diabetes mellitus:[name] Major depression with psychotic features: CKD stage 3 due to type 2 diabetes mellitus     HPI:  He is a 76 year old long term resident of this facility being seen for the management of his chronic illnesses:  Dyslipidemia associated with type 2 diabetes mellitus; Micro-albuminuria due to type 2 diabetes mellitus:[name] Major depression with psychotic features: CKD stage 3 due to type 2 diabetes mellitus. There are no reports of uncontrolled pain. No reports of anxiety or depressive thoughts for the past couple of weeks.   Past Medical History:  Diagnosis Date   A-fib [facility])    a. Post-op afib after CABG 08/2012.   Acute gastric ulcer with hemorrhage    Acute respiratory failure with hypoxia (HCC)    Aphasia following cerebral infarction    CAD (coronary artery disease)    a. NSTEMI s/p stent to distal RCA 03/2000. b. Inf MI s/p emergent thrombectomy/stenting mid RCA 10/2000. c. NSTEMI s/p CABGx4 (LIMA-LAD, SVG-OM2, seq SVG-acute marginal and PD) 7/0/01 - course complicated by confusion, post-op AF, L pleural effusion with thoracentesis. d. Normal LV function by echo 08/2012.   Diverticulosis    DM with CKD    Dysphagia following cerebral infarction    Encephalopathy    Extended spectrum beta lactamase (ESBL) resistance    Generalized anxiety disorder    GERD (gastroesophageal reflux disease)    Hemiplegia and hemiparesis following cerebral infarction affecting right dominant side (HCC)    Hiatal hernia    HLD (hyperlipidemia)    HTN (hypertension)    Non-ST elevation (NSTEMI) myocardial infarction [facility])    Nontraumatic intracerebral hemorrhage in hemisphere,  subcortical [facility])    Peripheral vascular disease ([place])    a. Carotid dopplers neg 08/13/12. b. Pre-cabg ABIs - R=0.87 suggesting mild dz, L=1.29 possibly falsely elevated due to calcified vessels.   Pleural effusion    a. L pleural eff after CABG s/p thoracentesis 08/22/12.   Prostate cancer [facility])    Status post radiation treatment.   Prostatic hypertrophy    a. Hx of urinary retention, awaiting TURP.   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 Mild  MR by TEE 08/2012.    Past Surgical History:  Procedure Laterality Date   APPENDECTOMY     BACK SURGERY     CORONARY ARTERY BYPASS GRAFT  08/16/2012   Procedure: CORONARY ARTERY BYPASS GRAFTING (CABG);  Surgeon: Melrose Nakayama, MD;  Location: [place];  Service: Open Heart Surgery;  Laterality: N/A;   IR REPLC GASTRO/COLONIC TUBE PERCUT W/FLUORO  02/13/2019   LEFT HEART CATHETERIZATION WITH CORONARY ANGIOGRAM N/A 08/14/2012   Procedure: LEFT HEART CATHETERIZATION WITH CORONARY ANGIOGRAM;  Surgeon: Peter M Martinique, MD;  Location: [facility] CATH LAB;  Service: Cardiovascular;  Laterality: N/A;    Social History   Socioeconomic History   Marital status: Married    Spouse name: Not on file   Number of children: Not on file   Years of education: Not on file  Highest education level: Not on file  Occupational History   Occupation: retired   Tobacco Use   Smoking status: Former    Types: Cigarettes    Quit date: 11/12/1972    Years since quitting: 49.2   Smokeless tobacco: Never  Vaping Use   Vaping Use: Never used  Substance and Sexual Activity   Alcohol use: No   Drug use: No   Sexual activity: Not Currently  Other Topics Concern   Not on file  Social History Narrative   He is a long term patient of [facility]    Social Determinants of Health   Financial Resource Strain: Not on file  Food Insecurity: Not on file   Transportation Needs: Not on file  Physical Activity: Not on file  Stress: Not on file  Social Connections: Not on file  Intimate Partner Violence: Not on file   Family History  Problem Relation Age of Onset   Hypertension Mother       VITAL SIGNS BP 124/73    Pulse 69    Temp 98.2 F (36.8 C)    Resp 18    Ht _0  (1.803 m)    Wt 171 lb 12.8 oz (77.9 kg)    SpO2 95%    BMI 23.96 kg/m   Outpatient Encounter Medications as of 01/28/2022  Medication Sig   acetaminophen (TYLENOL) 325 MG tablet Take 650 mg by mouth every 4 (four) hours as needed. per standing order for pain/fever DO NOT EXCEED >3,000 mg in 24 hours   Amino Acids-Protein Hydrolys (FEEDING SUPPLEMENT, PRO-STAT SUGAR FREE 64,) LIQD Take 30 mLs by mouth 3 (three) times daily with meals. for albumin 2.6   amLODipine (NORVASC) 5 MG tablet Take 5 mg by mouth daily.    apixaban (ELIQUIS) 5 MG TABS tablet Take 5 mg by mouth 2 (two) times daily.   atorvastatin (LIPITOR) 40 MG tablet Take 40 mg by mouth daily.   Baclofen 5 MG TABS Take 5 mg by mouth 3 (three) times daily. Nontraumatic intracerebral hemorrhage in hemisphere, subcortical   Balsam [place]-Castor Oil (VENELEX) OINT Apply topically. Special Instructions: Apply to sacrum, coccyx and bilateral buttocks qshift for prevention/ redness. Every Shift Day, Evening, Night   Eyelid Cleansers (OCUSOFT LID SCRUB EX) daily. Cleanse both eye lids   Glucerna (GLUCERNA) LIQD Take 237 mLs by mouth daily.   hydrocortisone cream 1 % Apply 1 application topically as needed (for hemorrhoids).   insulin glargine (LANTUS) 100 UNIT/ML injection Inject 20 Units into the skin daily.   insulin lispro (HUMALOG) 100 UNIT/ML cartridge Inject 5 Units into the skin in the morning and at bedtime. Give 5 units  with breakfast and lunch   lansoprazole (PREVACID SOLUTAB) 30 MG disintegrating tablet Take 30 mg by mouth daily. Give before meals   loperamide (IMODIUM A-D) 2 MG tablet Take 2 mg by mouth as  needed for diarrhea or loose stools. May give another dose after each loose stool, up to 8 mg per 24 hours.   loratadine (CLARITIN) 10 MG tablet Take 10 mg by mouth daily.   losartan (COZAAR) 25 MG tablet Take 25 mg by mouth daily. DX: Hypertensive heart and chronic kidney disease without heart failure, with stage 1 through stage 4 chronic kidney disease, or unspecified chronic kidney disease-per NP note, code updated   Melatonin 1 MG TABS Take 2 tablets by mouth at bedtime.    NON FORMULARY Diet Change: Regular, thin liquids   ondansetron (ZOFRAN-ODT) 4 MG disintegrating  tablet Take 4 mg by mouth every 6 (six) hours as needed for nausea or vomiting.   oxybutynin (DITROPAN XL) 15 MG 24 hr tablet Take 15 mg by mouth daily.   polyethylene glycol (MIRALAX / GLYCOLAX) 17 g packet Take 17 g by mouth daily as needed.   rOPINIRole (REQUIP) 0.25 MG tablet Take 0.25 mg by mouth at bedtime.   No facility-administered encounter medications on file as of 01/28/2022.     SIGNIFICANT DIAGNOSTIC EXAMS   PREVIOUS   05-12-21: right lower extremity venous doppler: preliminary report: clot from groin to foot.   05-12-21: right lower extremity venous doppler (From ED):   1. Extensive acute mostly occlusive deep venous thrombosis extending from the right common femoral vein into the calf veins.  NO NEW EXAMS.    LABS REVIEWED PREVIOUS:    02-15-21: urine culture: multiple species  03-25-21 hgb a1c 6.6 05-11-21: urine for micro albumin 1299.7 (413.2) on ARB   05-12-21: wbc 10.6; hgb `15.2; hct 47.9; mcv 95.0 plt 181; glucose 233; bun 49; creat 1.98; k+ 4.4; na++ 137; ca 8.2; GFR34 05-14-21: glucose 116; bun 32; creat 1.68; k+ 4.1; na++ 139; ca 8.2; GFR 42 05-15-21: wbc 7.5; hgb 14.7; hct 44.6; mcv 89.7 plt 182  05-21-21: wbc 8.6; hgb 13.6; hct 42.9; mcv 93.5 plt 209; glucose 147; bun 35; creat 1.75; k+ 4.0 na++ 8.1; GFR 40 07-23-21: wbc 7.4; hgb 15.8; hct 50.3; mcv 94.7 plt 189; urine culture: enterococcus  faecalis/providencia stuartii  08-09-21: wbc 8.3; hgb 14.5; hct 45.3; mcv 93.8 plt 192; glucose 127; bun 34; creat 1.83; k+ 4.1; na++ 140; ca 7.8; GFR 38; alk phos 130; albumin 2.6 urine culture: pseudomonas aeruginosa: cipro 09-09-21: hgb a1c 6.5; chol 95; ldl 58; trig 75; hdl 22; PSA 0.37 09-13-21: wbc 7.4; hgb 15.3; hct 48.5; mcv 94.0 plt 180; glucose 247; bun 43; creat 2.11; k+ 4.7; na++ 139; ca 8.3; GFR 32; d-dimer <0.27; CRP 2.34 09-29-21: wbc 8.0; hgb 14.9; hct 46.9; mcv 94.4 plt 170; glucose 168; bun 50; creat 2.04; k+ 4.5; na++ 134; ca 7.9; GFR 33; urine culture: no growth  12-07-21: urine culture: mixed bacteria 01-03-22: glucose 71; bun 54; creat 2.15; k+ 4.4; na++ 138; ca 8.7; GFR 31; liver normal albumin 3.2; hgb a1c 6.2   NO NEW LABS.   Review of Systems  Reason unable to perform ROS: aphasia.     Physical Exam Constitutional:      General: He is not in acute distress.    Appearance: He is well-developed. He is not diaphoretic.  Neck:     Thyroid: No thyromegaly.  Cardiovascular:     Rate and Rhythm: Normal rate and regular rhythm.     Pulses: Normal pulses.     Heart sounds: Normal heart sounds.  Pulmonary:     Effort: Pulmonary effort is normal. No respiratory distress.     Breath sounds: Normal breath sounds.  Abdominal:     General: Bowel sounds are normal. There is no distension.     Palpations: Abdomen is soft.     Tenderness: There is no abdominal tenderness.  Genitourinary:    Comments: Foley  Musculoskeletal:     Cervical back: Neck supple.     Right lower leg: No edema.     Left lower leg: No edema.     Comments: Right hemiplegia   Lymphadenopathy:     Cervical: No cervical adenopathy.  Skin:    General: Skin is warm and dry.  Neurological:  Mental Status: He is alert. Mental status is at baseline.  Psychiatric:        Mood and Affect: Mood normal.   ASSESSMENT/ PLAN:  TODAY:   Dyslipidemia associated with type 2 diabetes mellitus; is stable  ldl 77 will continue lipitor 40 mgm daily   2. Micro-albuminuria due to type 2 diabetes mellitus: is without cagne 1299.7 is on ARB  3. Major depression with psychotic features: is stable and presently off medications   4. CKD stage 3 due to type 2 diabetes mellitus: is stable bun 35; creat 1.72 GFR 40    PREVIOUS   5. Chronic urine retention/bladder spasms: is stable has long term foley: will continue ditropan xl 15 mg daily   6.Type 2 diabetes mellitus with peripheral vascular disease: is stable hgb a1c 6.7; will continue lantus 20 units nightly humalog 10 units with meals is on statin arb eliquis  7. Protein calorie malnutrition severe: is stable weight is 171 pounds; albumin 2.2 will continue supplements as indicated  8. Nontraumatic subcortical hemorrhage of left cerebral hemisphere. Right spastic hemiplegia: is stable will continue baclofen 10 mg twice daily for spasticity   9. Aortic atherosclerosis (ct 01-04-19) will monitor   10. Acute deep vein thrombosis (DVT) of femoral vein of right leg (05-12-21) is stable will continue eliquis 5 mg twice daily  11. CAD native heart without angina: is status post CABG 2012. Will continue cozaar 50 mg daily   12. Hypertension associated with type 2 diabetes mellitus: is stable b/p 124/73 will continue norvasc 5 mg daily cozaar 50 mg daily    Ok Edwards NP [facility]  call [phone]

## 2022-02-03 DIAGNOSIS — I610 Nontraumatic intracerebral hemorrhage in hemisphere, subcortical: Secondary | ICD-10-CM | POA: Diagnosis not present

## 2022-02-03 DIAGNOSIS — Z1159 Encounter for screening for other viral diseases: Secondary | ICD-10-CM | POA: Diagnosis not present

## 2022-02-10 DIAGNOSIS — I610 Nontraumatic intracerebral hemorrhage in hemisphere, subcortical: Secondary | ICD-10-CM | POA: Diagnosis not present

## 2022-02-10 DIAGNOSIS — Z1159 Encounter for screening for other viral diseases: Secondary | ICD-10-CM | POA: Diagnosis not present

## 2022-02-23 ENCOUNTER — Encounter: Payer: Self-pay | Admitting: Adult Health

## 2022-02-23 ENCOUNTER — Non-Acute Institutional Stay (SKILLED_NURSING_FACILITY): Payer: Medicare PPO | Admitting: Adult Health

## 2022-02-23 DIAGNOSIS — G8111 Spastic hemiplegia affecting right dominant side: Secondary | ICD-10-CM

## 2022-02-23 DIAGNOSIS — E1151 Type 2 diabetes mellitus with diabetic peripheral angiopathy without gangrene: Secondary | ICD-10-CM

## 2022-02-23 DIAGNOSIS — R339 Retention of urine, unspecified: Secondary | ICD-10-CM

## 2022-02-23 DIAGNOSIS — I610 Nontraumatic intracerebral hemorrhage in hemisphere, subcortical: Secondary | ICD-10-CM

## 2022-02-23 DIAGNOSIS — N3289 Other specified disorders of bladder: Secondary | ICD-10-CM | POA: Diagnosis not present

## 2022-02-23 NOTE — Progress Notes (Signed)
?Location:  [place] [facility] Room Number: 130 ?Place of Service:  SNF (31) ?Provider: Ok Edwards, NP ? ? ?CODE STATUS: DNR ? ?No Known Allergies ? ?Chief Complaint  ?Patient presents with  ? Medical Management of Chronic Issues  ?                         Chronic urine retention/bladder spasms: Type 2 diabetes mellitus with peripheral vascular disease: Protein calorie malnutrition severe: Nontraumatic subcortical hemorrhage left cerebral hemisphere/ right spastic hemiplegia  ?             ? ? ?HPI: ? ?He is a [name] long term resident of this facility being seen for the management of his chronic illnesses: Chronic urine retention/bladder spasms: Type 2 diabetes mellitus with peripheral vascular disease: Protein calorie malnutrition severe: Nontraumatic subcortical hemorrhage left cerebral hemisphere/ right spastic hemiplegia. There are no reports of uncontrolled pain. No reports of anxiety or depressive thoughts. No changes in appetite  ? ?Past Medical History:  ?Diagnosis Date  ? A-fib ([place])   ? a. Post-op afib after CABG 08/2012.  ? Acute gastric ulcer with hemorrhage   ? Acute respiratory failure with hypoxia ([place])   ? Aphasia following cerebral infarction   ? CAD (coronary artery disease)   ? a. NSTEMI s/p stent to distal RCA 03/2000. b. Inf MI s/p emergent thrombectomy/stenting mid RCA 10/2000. c. NSTEMI s/p CABGx4 (LIMA-LAD, SVG-OM2, seq SVG-acute marginal and PD) 0/9/62 - course complicated by confusion, post-op AF, L pleural effusion with thoracentesis. d. Normal LV function by echo 08/2012.  ? Diverticulosis   ? DM with CKD   ? Dysphagia following cerebral infarction   ? Encephalopathy   ? Extended spectrum beta lactamase (ESBL) resistance   ? Generalized anxiety disorder   ? GERD (gastroesophageal reflux disease)   ? Hemiplegia and hemiparesis following cerebral infarction affecting right dominant side ([place])   ? Hiatal hernia   ? HLD (hyperlipidemia)   ? HTN (hypertension)   ? Non-ST  elevation (NSTEMI) myocardial infarction [facility])   ? Nontraumatic intracerebral hemorrhage in hemisphere, subcortical ([place])   ? Peripheral vascular disease ([place])   ? a. Carotid dopplers neg 08/13/12. b. Pre-cabg ABIs - R=0.87 suggesting mild dz, L=1.29 possibly falsely elevated due to calcified vessels.  ? Pleural effusion   ? a. L pleural eff after CABG s/p thoracentesis 08/22/12.  ? Prostate cancer ([place])   ? Status post radiation treatment.  ? Prostatic hypertrophy   ? a. Hx of urinary retention, awaiting TURP.  ? Severe protein-calorie malnutrition ([place])   ? Stroke [facility])   ? Tobacco abuse   ? 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 Valvular heart disease   ? a. Mild  MR by TEE 08/2012.  ? ? ?Past Surgical History:  ?Procedure Laterality Date  ? APPENDECTOMY    ? BACK SURGERY    ? CORONARY ARTERY BYPASS GRAFT  08/16/2012  ? Procedure: CORONARY ARTERY BYPASS GRAFTING (CABG);  Surgeon: Melrose Nakayama, MD;  Location: [place];  Service: Open Heart Surgery;  Laterality: N/A;  ? IR REPLC GASTRO/COLONIC TUBE PERCUT W/FLUORO  02/13/2019  ? LEFT HEART CATHETERIZATION WITH CORONARY ANGIOGRAM N/A 08/14/2012  ? Procedure: LEFT HEART CATHETERIZATION WITH CORONARY ANGIOGRAM;  Surgeon: Peter M Martinique, MD;  Location: [facility] CATH LAB;  Service: Cardiovascular;  Laterality: N/A;  ? ? ?Social History  ? ?Socioeconomic History  ? Marital status: Married  ?  Spouse name: Not on file  ? Number of children: Not on file  ? Years of education: Not on file  ? Highest education level: Not on file  ?Occupational History  ? Occupation: retired   ?Tobacco Use  ? Smoking status: Former  ?  Types: Cigarettes  ?  Quit date: 11/12/1972  ?  Years since quitting: Tanner.3  ? Smokeless tobacco: Never  ?Vaping Use  ? Vaping Use: Never used  ?Substance and Sexual Activity  ? Alcohol use: No  ? Drug use: No  ? Sexual activity: Not Currently  ?Other Topics Concern  ? Not on file  ?Social History Narrative  ? He is a long term patient of [name]   ? ?Social  Determinants of Health  ? ?Financial Resource Strain: Not on file  ?Food Insecurity: Not on file  ?Transportation Needs: Not on file  ?Physical Activity: Not on file  ?Stress: Not on file  ?Social Connections: Not on file  ?Intimate Partner Violence: Not on file  ? ?Family History  ?Problem Relation Age of Onset  ? Hypertension Mother   ? ? ? ? ?VITAL SIGNS ?BP 128/75   Pulse 64   Temp 98 ?F (36.7 ?C)   Ht '5\' 11"'$  (1.803 m)   Wt 172 lb 12.8 oz (78.4 kg)   BMI 24.10 kg/m?  ? ?Outpatient Encounter Medications as of 02/23/2022  ?Medication Sig  ? acetaminophen (TYLENOL) 325 MG tablet Take 650 mg by mouth every 4 (four) hours as needed. per standing order for pain/fever DO NOT EXCEED >3,000 mg in 24 hours  ? amLODipine (NORVASC) 5 MG tablet Take 5 mg by mouth daily.   ? apixaban (ELIQUIS) 5 MG TABS tablet Take 5 mg by mouth 2 (two) times daily.  ? atorvastatin (LIPITOR) 40 MG tablet Take 40 mg by mouth daily.  ? Baclofen 5 MG TABS Take 5 mg by mouth 3 (three) times daily. Nontraumatic intracerebral hemorrhage in hemisphere, subcortical  ? Balsam [place]-Castor Oil (VENELEX) OINT Apply topically. Special Instructions: Apply to sacrum, coccyx and bilateral buttocks qshift for prevention/ redness. ?Every Shift ?Day, Evening, Night  ? BD AUTOSHIELD DUO 30G X 5 MM MISC 3/16"  ? Eyelid Cleansers (OCUSOFT LID SCRUB EX) daily. Cleanse both eye lids  ? Glucerna (GLUCERNA) LIQD Take 237 mLs by mouth daily.  ? hydrocortisone cream 1 % Apply 1 application topically as needed (for hemorrhoids).  ? insulin glargine (LANTUS) 100 UNIT/ML injection Inject 20 Units into the skin daily.  ? insulin lispro (HUMALOG) 100 UNIT/ML cartridge Inject 5 Units into the skin in the morning and at bedtime. Give 5 units  with breakfast and lunch  ? lansoprazole (PREVACID SOLUTAB) 30 MG disintegrating tablet Take 30 mg by mouth daily. Give before meals  ? loperamide (IMODIUM A-D) 2 MG tablet Take 2 mg by mouth as needed for diarrhea or loose stools.  May give another dose after each loose stool, up to 8 mg per 24 hours.  ? loratadine (CLARITIN) 10 MG tablet Take 10 mg by mouth daily.  ? losartan (COZAAR) 25 MG tablet Take 25 mg by mouth daily. DX: Hypertensive heart and chronic kidney disease without heart failure, with stage 1 through stage 4 chronic kidney disease, or unspecified chronic kidney disease-per NP note, code updated  ? Melatonin 1 MG TABS Take 2 tablets by mouth at bedtime.   ? NON FORMULARY Diet Change: Regular, thin liquids  ? ondansetron (ZOFRAN-ODT) 4 MG disintegrating tablet Take 4 mg by mouth every  6 (six) hours as needed for nausea or vomiting.  ? oxybutynin (DITROPAN XL) 15 MG 24 hr tablet Take 15 mg by mouth daily.  ? polyethylene glycol (MIRALAX / GLYCOLAX) 17 g packet Take 17 g by mouth daily as needed.  ? rOPINIRole (REQUIP) 0.25 MG tablet Take 0.25 mg by mouth at bedtime.  ? [DISCONTINUED] Amino Acids-Protein Hydrolys (FEEDING SUPPLEMENT, PRO-STAT SUGAR FREE 64,) LIQD Take 30 mLs by mouth 3 (three) times daily with meals. for albumin 2.6  ? ?No facility-administered encounter medications on file as of 02/23/2022.  ? ? ? ?SIGNIFICANT DIAGNOSTIC EXAMS ? ?PREVIOUS  ? ?05-12-21: right lower extremity venous doppler: preliminary report: clot from groin to foot.  ? ?05-12-21: right lower extremity venous doppler (From ED):  ? 1. Extensive acute mostly occlusive deep venous thrombosis extending from the right common femoral vein into the calf veins. ? ?NO NEW EXAMS.  ? ? ?LABS REVIEWED PREVIOUS:  ?  ?02-15-21: urine culture: multiple species  ?03-25-21 hgb a1c 6.6 ?05-11-21: urine for micro albumin 1299.7 (413.2) on ARB   ?05-12-21: wbc 10.6; hgb `15.2; hct 47.9; mcv 95.0 plt 181; glucose 233; bun [place]; creat 1.98; k+ 4.4; na++ 137; ca 8.2; GFR34 ?05-14-21: glucose 116; bun 32; creat 1.68; k+ 4.1; na++ 139; ca 8.2; GFR 42 ?05-15-21: wbc 7.5; hgb 14.7; hct 44.6; mcv 89.7 plt 182  ?05-21-21: wbc 8.6; hgb 13.6; hct 42.9; mcv 93.5 plt 209; glucose 147; bun 35;  creat 1.75; k+ 4.0 na++ 8.1; GFR 40 ?07-23-21: wbc 7.4; hgb 15.8; hct 50.3; mcv 94.7 plt 189; urine culture: enterococcus faecalis/providencia stuartii  ?08-09-21: wbc 8.3; hgb 14.5; hct 45.3; mcv 93.8 plt 192;

## 2022-02-25 ENCOUNTER — Encounter: Payer: Self-pay | Admitting: Adult Health

## 2022-03-01 DIAGNOSIS — Z86718 Personal history of other venous thrombosis and embolism: Secondary | ICD-10-CM | POA: Diagnosis not present

## 2022-03-01 DIAGNOSIS — I872 Venous insufficiency (chronic) (peripheral): Secondary | ICD-10-CM | POA: Diagnosis not present

## 2022-03-01 DIAGNOSIS — R2241 Localized swelling, mass and lump, right lower limb: Secondary | ICD-10-CM | POA: Diagnosis not present

## 2022-03-08 ENCOUNTER — Ambulatory Visit: Payer: Medicare PPO | Admitting: Urology

## 2022-03-09 DIAGNOSIS — F333 Major depressive disorder, recurrent, severe with psychotic symptoms: Secondary | ICD-10-CM | POA: Diagnosis not present

## 2022-03-09 DIAGNOSIS — F5105 Insomnia due to other mental disorder: Secondary | ICD-10-CM | POA: Diagnosis not present

## 2022-03-13 ENCOUNTER — Encounter (HOSPITAL_COMMUNITY)
Admission: RE | Admit: 2022-03-13 | Discharge: 2022-03-13 | Disposition: A | Discharge status: Home / Self Care | Payer: Medicare PPO | Source: Skilled Nursing Facility | Attending: Internal Medicine | Admitting: Internal Medicine

## 2022-03-13 DIAGNOSIS — U071 COVID-19: Secondary | ICD-10-CM | POA: Diagnosis not present

## 2022-03-13 DIAGNOSIS — R918 Other nonspecific abnormal finding of lung field: Secondary | ICD-10-CM | POA: Diagnosis not present

## 2022-03-13 LAB — CBC
HCT: 51.2 % (ref 39.0–52.0)
Hemoglobin: 16 g/dL (ref 13.0–17.0)
MCH: 29.3 pg (ref 26.0–34.0)
MCHC: 31.3 g/dL (ref 30.0–36.0)
MCV: 93.6 fL (ref 80.0–100.0)
Platelets: 148 10*3/uL — ABNORMAL LOW (ref 150–400)
RBC: 5.47 MIL/uL (ref 4.22–5.81)
RDW: 13.3 % (ref 11.5–15.5)
WBC: 8 10*3/uL (ref 4.0–10.5)
nRBC: 0 % (ref 0.0–0.2)

## 2022-03-13 LAB — C-REACTIVE PROTEIN: CRP: 6.4 mg/dL — ABNORMAL HIGH (ref <1.0)

## 2022-03-13 LAB — BASIC METABOLIC PANEL
Anion gap: 6 (ref 5–15)
BUN: 47 mg/dL — ABNORMAL HIGH (ref 8–23)
CO2: 27 mmol/L (ref 22–32)
Calcium: 8.7 mg/dL — ABNORMAL LOW (ref 8.9–10.3)
Chloride: 107 mmol/L (ref 98–111)
Creatinine, Ser: 2.5 mg/dL — ABNORMAL HIGH (ref 0.61–1.24)
GFR, Estimated: 26 mL/min — ABNORMAL LOW (ref >60)
Glucose, Bld: 156 mg/dL — ABNORMAL HIGH (ref 70–99)
Potassium: 4.7 mmol/L (ref 3.5–5.1)
Sodium: 140 mmol/L (ref 135–145)

## 2022-03-13 LAB — D-DIMER, QUANTITATIVE: D-Dimer, Quant: 0.41 ug/mL-FEU (ref 0.00–0.50)

## 2022-03-14 ENCOUNTER — Non-Acute Institutional Stay (SKILLED_NURSING_FACILITY): Payer: Medicare PPO | Admitting: Adult Health

## 2022-03-14 ENCOUNTER — Encounter: Payer: Self-pay | Admitting: Adult Health

## 2022-03-14 ENCOUNTER — Other Ambulatory Visit (HOSPITAL_COMMUNITY)
Admission: RE | Admit: 2022-03-14 | Discharge: 2022-03-14 | Disposition: A | Discharge status: Home / Self Care | Payer: Medicare PPO | Source: Skilled Nursing Facility | Attending: Internal Medicine | Admitting: Internal Medicine

## 2022-03-14 DIAGNOSIS — U071 COVID-19: Secondary | ICD-10-CM | POA: Insufficient documentation

## 2022-03-14 DIAGNOSIS — R7982 Elevated C-reactive protein (CRP): Secondary | ICD-10-CM

## 2022-03-14 DIAGNOSIS — J1282 Pneumonia due to coronavirus disease 2019: Secondary | ICD-10-CM | POA: Diagnosis not present

## 2022-03-14 LAB — SARS CORONAVIRUS 2 BY RT PCR (HOSPITAL ORDER, PERFORMED IN ~~LOC~~ HOSPITAL LAB): SARS Coronavirus 2: POSITIVE — AB

## 2022-03-14 NOTE — Progress Notes (Signed)
?Location:  [place] ?Nursing Home Room Number: 130-P ?Place of Service:  SNF (31) ? ? ?CODE STATUS: DNR ? ?No Known Allergies ? ?Chief Complaint  ?Patient presents with  ? Acute Visit  ?  COVID positive   ? ? ?HPI: ? ?He has been tested positive for covid 19. His chest x-ray demonstrated left lower lobe pneumonia. No reports of fevers; no reports of cough or shortness of breath present. No body body aches present.  ? ?Past Medical History:  ?Diagnosis Date  ? A-fib ([place])   ? a. Post-op afib after CABG 08/2012.  ? Acute gastric ulcer with hemorrhage   ? Acute respiratory failure with hypoxia ([place])   ? Aphasia following cerebral infarction   ? CAD (coronary artery disease)   ? a. NSTEMI s/p stent to distal RCA 03/2000. b. Inf MI s/p emergent thrombectomy/stenting mid RCA 10/2000. c. NSTEMI s/p CABGx4 (LIMA-LAD, SVG-OM2, seq SVG-acute marginal and PD) 01/18/36 - course complicated by confusion, post-op AF, L pleural effusion with thoracentesis. d. Normal LV function by echo 08/2012.  ? Diverticulosis   ? DM with CKD   ? Dysphagia following cerebral infarction   ? Encephalopathy   ? Extended spectrum beta lactamase (ESBL) resistance   ? Generalized anxiety disorder   ? GERD (gastroesophageal reflux disease)   ? Hemiplegia and hemiparesis following cerebral infarction affecting right dominant side ([place])   ? Hiatal hernia   ? HLD (hyperlipidemia)   ? HTN (hypertension)   ? Non-ST elevation (NSTEMI) myocardial infarction [facility])   ? Nontraumatic intracerebral hemorrhage in hemisphere, subcortical ([place])   ? Peripheral vascular disease ([place])   ? a. Carotid dopplers neg 08/13/12. b. Pre-cabg ABIs - R=0.87 suggesting mild dz, L=1.29 possibly falsely elevated due to calcified vessels.  ? Pleural effusion   ? a. L pleural eff after CABG s/p thoracentesis 08/22/12.  ? Prostate cancer ([place])   ? Status post radiation treatment.  ? Prostatic hypertrophy   ? a. Hx of urinary retention, awaiting TURP.  ? Severe protein-calorie  malnutrition ([place])   ? Stroke [facility])   ? Tobacco abuse   ? 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 Valvular heart disease   ? a. Mild  MR by TEE 08/2012.  ? ? ?Past Surgical History:  ?Procedure Laterality Date  ? APPENDECTOMY    ? BACK SURGERY    ? CORONARY ARTERY BYPASS GRAFT  08/16/2012  ? Procedure: CORONARY ARTERY BYPASS GRAFTING (CABG);  Surgeon: Melrose Nakayama, MD;  Location: [place];  Service: Open Heart Surgery;  Laterality: N/A;  ? IR REPLC GASTRO/COLONIC TUBE PERCUT W/FLUORO  02/13/2019  ? LEFT HEART CATHETERIZATION WITH CORONARY ANGIOGRAM N/A 08/14/2012  ? Procedure: LEFT HEART CATHETERIZATION WITH CORONARY ANGIOGRAM;  Surgeon: Peter M Martinique, MD;  Location: [facility] CATH LAB;  Service: Cardiovascular;  Laterality: N/A;  ? ? ?Social History  ? ?Socioeconomic History  ? Marital status: Married  ?  Spouse name: Not on file  ? Number of children: Not on file  ? Years of education: Not on file  ? Highest education level: Not on file  ?Occupational History  ? Occupation: retired   ?Tobacco Use  ? Smoking status: Former  ?  Types: Cigarettes  ?  Quit date: 11/12/1972  ?  Years since quitting: 49.3  ? Smokeless tobacco: Never  ?Vaping Use  ? Vaping Use: Never used  ?Substance and Sexual Activity  ? Alcohol use: No  ? Drug use: No  ?  Sexual activity: Not Currently  ?Other Topics Concern  ? Not on file  ?Social History Narrative  ? He is a long term patient of [facility]   ? ?Social Determinants of Health  ? ?Financial Resource Strain: Not on file  ?Food Insecurity: Not on file  ?Transportation Needs: Not on file  ?Physical Activity: Not on file  ?Stress: Not on file  ?Social Connections: Not on file  ?Intimate Partner Violence: Not on file  ? ?Family History  ?Problem Relation Age of Onset  ? Hypertension Mother   ? ? ? ? ?VITAL SIGNS ?BP (!) 116/58   Pulse 80   Temp 97.9 ?F (36.6 ?C)   Resp 20   Ht _0  (1.803 m)   Wt 172 lb 12.8 oz (78.4 kg)   SpO2 95%   BMI 24.10 kg/m?  ? ?Outpatient Encounter  Medications as of 03/14/2022  ?Medication Sig  ? acetaminophen (TYLENOL) 325 MG tablet Take 650 mg by mouth every 4 (four) hours as needed. per standing order for pain/fever DO NOT EXCEED >3,000 mg in 24 hours  ? amLODipine (NORVASC) 5 MG tablet Take 5 mg by mouth daily.   ? amoxicillin-clavulanate (AUGMENTIN) 875-125 MG tablet Take 1 tablet by mouth 2 (two) times daily. COVID pneumonia  ? apixaban (ELIQUIS) 5 MG TABS tablet Take 5 mg by mouth 2 (two) times daily.  ? atorvastatin (LIPITOR) 40 MG tablet Take 40 mg by mouth daily.  ? Baclofen 5 MG TABS Take 5 mg by mouth 3 (three) times daily. Nontraumatic intracerebral hemorrhage in hemisphere, subcortical  ? Balsam [place]-Castor Oil (VENELEX) OINT Apply topically. Special Instructions: Apply to sacrum, coccyx and bilateral buttocks qshift for prevention/ redness. ?Every Shift ?Day, Evening, Night  ? BD AUTOSHIELD DUO 30G X 5 MM MISC 3/16"  ? ergocalciferol (VITAMIN D2) 1.25 MG (50000 UT) capsule Take 1,250 mcg by mouth once a week.  ? Eyelid Cleansers (OCUSOFT LID SCRUB EX) daily. Cleanse both eye lids  ? Glucerna (GLUCERNA) LIQD Take 237 mLs by mouth daily.  ? hydrocortisone cream 1 % Apply 1 application topically as needed (for hemorrhoids).  ? insulin glargine (LANTUS) 100 UNIT/ML injection Inject 20 Units into the skin daily.  ? insulin lispro (HUMALOG) 100 UNIT/ML cartridge Inject 5 Units into the skin in the morning and at bedtime. Give 5 units  with breakfast and lunch  ? lansoprazole (PREVACID SOLUTAB) 30 MG disintegrating tablet Take 30 mg by mouth daily. Give before meals  ? loperamide (IMODIUM A-D) 2 MG tablet Take 2 mg by mouth as needed for diarrhea or loose stools. May give another dose after each loose stool, up to 8 mg per 24 hours.  ? loratadine (CLARITIN) 10 MG tablet Take 10 mg by mouth daily.  ? losartan (COZAAR) 25 MG tablet Take 25 mg by mouth daily. DX: Hypertensive heart and chronic kidney disease without heart failure, with stage 1 through stage  4 chronic kidney disease, or unspecified chronic kidney disease-per NP note, code updated  ? Melatonin 1 MG TABS Take 2 tablets by mouth at bedtime.   ? molnupiravir EUA (LAGEVRIO) 200 MG CAPS capsule Take 4 capsules by mouth 2 (two) times daily.  ? NON FORMULARY Diet Change: Regular, thin liquids  ? ondansetron (ZOFRAN-ODT) 4 MG disintegrating tablet Take 4 mg by mouth every 6 (six) hours as needed for nausea or vomiting.  ? oxybutynin (DITROPAN XL) 15 MG 24 hr tablet Take 15 mg by mouth daily.  ? polyethylene glycol (MIRALAX /  GLYCOLAX) 17 g packet Take 17 g by mouth daily as needed.  ? predniSONE (DELTASONE) 20 MG tablet Take 20 mg by mouth in the morning and at bedtime. for CRP 6.4  ? rOPINIRole (REQUIP) 0.25 MG tablet Take 0.25 mg by mouth at bedtime.  ? Throat Lozenges (ZINC W/A&C) LOZG lozenge; - ; oral ?Special Instructions: 5 Times Per Day x 2 weeks.  ? vitamin C (ASCORBIC ACID) 500 MG tablet Take 500 mg by mouth 2 (two) times daily.  ? ?No facility-administered encounter medications on file as of 03/14/2022.  ? ? ? ?SIGNIFICANT DIAGNOSTIC EXAMS ? ?PREVIOUS  ? ?05-12-21: right lower extremity venous doppler: preliminary report: clot from groin to foot.  ? ?05-12-21: right lower extremity venous doppler (From ED):  ? 1. Extensive acute mostly occlusive deep venous thrombosis extending from the right common femoral vein into the calf veins. ? ?TODAY ? ?03-13-22: chest x-ray: left base infiltrate   ? ? ?LABS REVIEWED PREVIOUS:  ?   ?03-25-21 hgb a1c 6.6 ?05-11-21: urine for micro albumin 1299.7 (413.2) on ARB   ?05-12-21: wbc 10.6; hgb `15.2; hct 47.9; mcv 95.0 plt 181; glucose 233; bun 49; creat 1.98; k+ 4.4; na++ 137; ca 8.2; GFR34 ?05-14-21: glucose 116; bun 32; creat 1.68; k+ 4.1; na++ 139; ca 8.2; GFR 42 ?05-15-21: wbc 7.5; hgb 14.7; hct 44.6; mcv 89.7 plt 182  ?05-21-21: wbc 8.6; hgb 13.6; hct 42.9; mcv 93.5 plt 209; glucose 147; bun 35; creat 1.75; k+ 4.0 na++ 8.1; GFR 40 ?07-23-21: wbc 7.4; hgb 15.8; hct 50.3; mcv 94.7  plt 189; urine culture: enterococcus faecalis/providencia stuartii  ?08-09-21: wbc 8.3; hgb 14.5; hct 45.3; mcv 93.8 plt 192; glucose 127; bun 34; creat 1.83; k+ 4.1; na++ 140; ca 7.8; GFR 38; alk phos 130; albumin 2

## 2022-03-17 DIAGNOSIS — U071 COVID-19: Secondary | ICD-10-CM | POA: Insufficient documentation

## 2022-03-17 DIAGNOSIS — J1282 Pneumonia due to coronavirus disease 2019: Secondary | ICD-10-CM | POA: Insufficient documentation

## 2022-03-17 DIAGNOSIS — R7982 Elevated C-reactive protein (CRP): Secondary | ICD-10-CM | POA: Insufficient documentation

## 2022-03-21 ENCOUNTER — Other Ambulatory Visit (HOSPITAL_COMMUNITY)
Admission: RE | Admit: 2022-03-21 | Discharge: 2022-03-21 | Disposition: A | Discharge status: Home / Self Care | Payer: Medicare PPO | Source: Skilled Nursing Facility | Attending: Adult Health | Admitting: Adult Health

## 2022-03-21 DIAGNOSIS — U071 COVID-19: Secondary | ICD-10-CM | POA: Diagnosis not present

## 2022-03-21 LAB — C-REACTIVE PROTEIN: CRP: <0.5 mg/dL (ref <1.0)

## 2022-03-31 ENCOUNTER — Non-Acute Institutional Stay (SKILLED_NURSING_FACILITY): Payer: Medicare PPO | Admitting: Internal Medicine

## 2022-03-31 ENCOUNTER — Encounter: Payer: Self-pay | Admitting: Internal Medicine

## 2022-03-31 DIAGNOSIS — U071 COVID-19: Secondary | ICD-10-CM | POA: Diagnosis not present

## 2022-03-31 DIAGNOSIS — I152 Hypertension secondary to endocrine disorders: Secondary | ICD-10-CM | POA: Diagnosis not present

## 2022-03-31 DIAGNOSIS — E1159 Type 2 diabetes mellitus with other circulatory complications: Secondary | ICD-10-CM

## 2022-03-31 DIAGNOSIS — N183 Chronic kidney disease, stage 3 unspecified: Secondary | ICD-10-CM | POA: Diagnosis not present

## 2022-03-31 DIAGNOSIS — E1122 Type 2 diabetes mellitus with diabetic chronic kidney disease: Secondary | ICD-10-CM

## 2022-03-31 DIAGNOSIS — R1312 Dysphagia, oropharyngeal phase: Secondary | ICD-10-CM

## 2022-03-31 NOTE — Assessment & Plan Note (Addendum)
Current creatinine is 2.50 and GFR 26 indicating AKI stage IV disease in the context of recent COVID infection.  Medication list reviewed; no change indicated at this time even in the ARB dose based on Up to Date reference. ?

## 2022-03-31 NOTE — Assessment & Plan Note (Signed)
BP controlled; no change in antihypertensive medications  

## 2022-03-31 NOTE — Assessment & Plan Note (Signed)
He denies any food or pill dysphagia; validity of history is in question.  CT of the head demonstrated moderately severe cortical atrophy as well as microvascular disease suggesting vascular dementia component. ?

## 2022-03-31 NOTE — Assessment & Plan Note (Signed)
He denies any COVID residual symptoms but again validity of responses is questionable at best as vascular dementia is suggested. ?

## 2022-03-31 NOTE — Progress Notes (Signed)
? ?NURSING HOME LOCATION:  [place] ?ROOM NUMBER: 130 P ? ?CODE STATUS: DNR ? ?PCP: Ok Edwards, NP ? ?This is a nursing facility follow up visit of chronic medical diagnoses & to document compliance with Regulation 483.30 (c) in [employer] Phase [employer] which mandates caregiver visit ( visits can alternate among physician, PA or NP as per statutes) within 10 days of 30 days / 60 days/ 90 days post admission to SNF date   ? ?Interim medical record and care since last SNF visit was updated with review of diagnostic studies and change in clinical status since last visit were documented. ? ?HPI: He is a permanent resident of facility with medical diagnoses of history of A-fib, history of stroke complicated by aphasia and dysphagia, CAD with history of MI, diabetes with CKD, GERD, dyslipidemia, essential hypertension, and severe protein/caloric malnutrition. ?He has had a CABG procedure as well as placement of PEG tube. ? ?On 4/3 he was seen for acute COVID infection.  Labs revealed AKI superimposed on CKD stage III with creatinine of 2.50 and GFR of 26 indicating stage IV disease.  CBC was normal except for a platelet count of 148,000.  D-dimer was 0.41 and C-reactive protein 6.4. ?Current A1c is 6.2%. ? ?Review of systems: He is essentially nonverbal, responding to queries with a negative shake of the head.  Via this mode, he denies any active symptoms. ? ?Constitutional: No fever, significant weight change, fatigue  ?Eyes: No redness, discharge, pain, vision change ?ENT/mouth: No nasal congestion,  purulent discharge, earache, change in hearing, sore throat  ?Cardiovascular: No chest pain, palpitations, paroxysmal nocturnal dyspnea, claudication, edema  ?Respiratory: No cough, sputum production, hemoptysis, DOE, significant snoring, apnea   ?Gastrointestinal: No heartburn, dysphagia, abdominal pain, nausea /vomiting, rectal bleeding, melena, change in bowels ?Genitourinary: No  dysuria, hematuria, pyuria, incontinence, nocturia ?Musculoskeletal: No joint stiffness, joint swelling, weakness, pain ?Dermatologic: No rash, pruritus, change in appearance of skin ?Neurologic: No dizziness, headache, syncope, seizures, numbness, tingling ?Psychiatric: No significant anxiety, depression, insomnia, anorexia ?Endocrine: No change in hair/skin/nails, excessive thirst, excessive hunger, excessive urination  ?Hematologic/lymphatic: No significant bruising, lymphadenopathy, abnormal bleeding ?Allergy/immunology: No itchy/watery eyes, significant sneezing, urticaria, angioedema ? ?Physical exam:  ?Pertinent or positive findings: Facies are blank.  As noted he is essentially nonverbal.  Pattern alopecia is present.  Eyebrows are decreased.  Ptosis is present greater on the right than the left.  Exhibits a slight gallop rhythm.  Breath sounds are decreased.  Foley catheter is in place.  He has 1/2+ edema of the right lower extremity.  Dorsalis pedis pulses are stronger than posterior tibial pulses.  He does not follow commands to test strength.  The right hand is flexed at the wrist and there is partial contraction of the fingers.  The lower extremities are atrophic, seemingly greater on the left than the right.  Toenails are thickened. ? ?General appearance: Adequately nourished; no acute distress, increased work of breathing is present.   ?Lymphatic: No lymphadenopathy about the head, neck, axilla. ?Eyes: No conjunctival inflammation or lid edema is present. There is no scleral icterus. ?Ears:  External ear exam shows no significant lesions or deformities.   ?Nose:  External nasal examination shows no deformity or inflammation. Nasal mucosa are pink and moist without lesions, exudates ?Oral exam:  Lips and gums are healthy appearing. There is no oropharyngeal erythema or exudate. ?Neck:  No thyromegaly, masses, tenderness noted.    ?Heart:  No murmur,  click, rub .  ?Lungs: without wheezes, rhonchi,  rales, rubs. ?Abdomen: Bowel sounds are normal. Abdomen is soft and nontender with no organomegaly, hernias, masses. ?GU: Deferred  ?Extremities:  No cyanosis, clubbing ?Neurologic exam :Balance, [place], finger to nose testing could not be completed due to clinical state ?Skin: Warm & dry w/o tenting. ?No significant lesions or rash. ? ?See summary under each active problem in the Problem List with associated updated therapeutic plan ? ? ?

## 2022-03-31 NOTE — Patient Instructions (Signed)
See assessment and plan under each diagnosis in the problem list and acutely for this visit 

## 2022-04-06 DIAGNOSIS — F5105 Insomnia due to other mental disorder: Secondary | ICD-10-CM | POA: Diagnosis not present

## 2022-04-06 DIAGNOSIS — F333 Major depressive disorder, recurrent, severe with psychotic symptoms: Secondary | ICD-10-CM | POA: Diagnosis not present

## 2022-04-15 ENCOUNTER — Encounter: Payer: Self-pay | Admitting: Adult Health

## 2022-04-15 ENCOUNTER — Non-Acute Institutional Stay (SKILLED_NURSING_FACILITY): Payer: Medicare PPO | Admitting: Adult Health

## 2022-04-15 DIAGNOSIS — I700 Atherosclerosis of aorta: Secondary | ICD-10-CM | POA: Diagnosis not present

## 2022-04-15 DIAGNOSIS — G8111 Spastic hemiplegia affecting right dominant side: Secondary | ICD-10-CM

## 2022-04-15 DIAGNOSIS — I739 Peripheral vascular disease, unspecified: Secondary | ICD-10-CM

## 2022-04-15 NOTE — Progress Notes (Signed)
?Location:  [place] ?Nursing Home Room Number: 130-P ?Place of Service:  SNF (31) ? ? ?CODE STATUS: dnr  ? ?No Known Allergies ? ?Chief Complaint  ?Patient presents with  ? Acute Visit  ?  Care plan meeting  ? ? ?HPI: ? ?We have come together for his care plan meeting. Family present  BIMS 5/15 mood 9/30. He is nonambulatory no falls. He requires extensive assist to dependent for his adl care. He has a foley and is incontinent of bowel. His urology appointment is pending for questionable suprapubic placement. Dietary:  weight is 172 pounds feeds self; regular diet food in bowls. Has good appetite.  Therapy: none at this time.  He continues to be followed for his chronic illnesses including:   Aortic atherosclerosis  Peripheral vascular disease Right spastic hemiparesis ? ?Past Medical History:  ?Diagnosis Date  ? A-fib ([place])   ? a. Post-op afib after CABG 08/2012.  ? Acute gastric ulcer with hemorrhage   ? Acute respiratory failure with hypoxia ([place])   ? Aphasia following cerebral infarction   ? CAD (coronary artery disease)   ? a. NSTEMI s/p stent to distal RCA 03/2000. b. Inf MI s/p emergent thrombectomy/stenting mid RCA 10/2000. c. NSTEMI s/p CABGx4 (LIMA-LAD, SVG-OM2, seq SVG-acute marginal and PD) 0/3/49 - course complicated by confusion, post-op AF, L pleural effusion with thoracentesis. d. Normal LV function by echo 08/2012.  ? Diverticulosis   ? DM with CKD   ? Dysphagia following cerebral infarction   ? Encephalopathy   ? Extended spectrum beta lactamase (ESBL) resistance   ? Generalized anxiety disorder   ? GERD (gastroesophageal reflux disease)   ? Hemiplegia and hemiparesis following cerebral infarction affecting right dominant side ([place])   ? Hiatal hernia   ? HLD (hyperlipidemia)   ? HTN (hypertension)   ? Non-ST elevation (NSTEMI) myocardial infarction [facility])   ? Nontraumatic intracerebral hemorrhage in hemisphere, subcortical ([place])   ? Peripheral vascular disease ([place])   ? a. Carotid dopplers  neg 08/13/12. b. Pre-cabg ABIs - R=0.87 suggesting mild dz, L=1.29 possibly falsely elevated due to calcified vessels.  ? Pleural effusion   ? a. L pleural eff after CABG s/p thoracentesis 08/22/12.  ? Prostate cancer ([place])   ? Status post radiation treatment.  ? Prostatic hypertrophy   ? a. Hx of urinary retention, awaiting TURP.  ? Severe protein-calorie malnutrition ([place])   ? Stroke [facility])   ? Tobacco abuse   ? 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 Valvular heart disease   ? a. Mild  MR by TEE 08/2012.  ? ? ?Past Surgical History:  ?Procedure Laterality Date  ? APPENDECTOMY    ? BACK SURGERY    ? CORONARY ARTERY BYPASS GRAFT  08/16/2012  ? Procedure: CORONARY ARTERY BYPASS GRAFTING (CABG);  Surgeon: Melrose Nakayama, MD;  Location: [place];  Service: Open Heart Surgery;  Laterality: N/A;  ? IR REPLC GASTRO/COLONIC TUBE PERCUT W/FLUORO  02/13/2019  ? LEFT HEART CATHETERIZATION WITH CORONARY ANGIOGRAM N/A 08/14/2012  ? Procedure: LEFT HEART CATHETERIZATION WITH CORONARY ANGIOGRAM;  Surgeon: Peter M Martinique, MD;  Location: [facility] CATH LAB;  Service: Cardiovascular;  Laterality: N/A;  ? ? ?Social History  ? ?Socioeconomic History  ? Marital status: Married  ?  Spouse name: Not on file  ? Number of children: Not on file  ? Years of education: Not on file  ? Highest education level: Not on file  ?Occupational History  ?  Occupation: retired   ?Tobacco Use  ? Smoking status: Former  ?  Types: Cigarettes  ?  Quit date: 11/12/1972  ?  Years since quitting: 49.4  ? Smokeless tobacco: Never  ?Vaping Use  ? Vaping Use: Never used  ?Substance and Sexual Activity  ? Alcohol use: No  ? Drug use: No  ? Sexual activity: Not Currently  ?Other Topics Concern  ? Not on file  ?Social History Narrative  ? He is a long term patient of [facility]   ? ?Social Determinants of Health  ? ?Financial Resource Strain: Not on file  ?Food Insecurity: Not on file  ?Transportation Needs: Not on file  ?Physical Activity: Not on file  ?Stress: Not on  file  ?Social Connections: Not on file  ?Intimate Partner Violence: Not on file  ? ?Family History  ?Problem Relation Age of Onset  ? Hypertension Mother   ? ? ? ? ?VITAL SIGNS ?BP 122/79   Pulse 70   Temp 98.3 ?F (36.8 ?C)   Resp 20   Ht 5' 11"  (1.803 m)   Wt 172 lb 1.6 oz (78.1 kg)   SpO2 95%   BMI 24.00 kg/m?  ? ?Outpatient Encounter Medications as of 04/15/2022  ?Medication Sig  ? acetaminophen (TYLENOL) 325 MG tablet Take 650 mg by mouth every 4 (four) hours as needed. per standing order for pain/fever DO NOT EXCEED >3,000 mg in 24 hours  ? amLODipine (NORVASC) 5 MG tablet Take 5 mg by mouth daily.   ? amoxicillin-clavulanate (AUGMENTIN) 875-125 MG tablet Take 1 tablet by mouth 2 (two) times daily. COVID pneumonia  ? apixaban (ELIQUIS) 5 MG TABS tablet Take 5 mg by mouth 2 (two) times daily.  ? atorvastatin (LIPITOR) 40 MG tablet Take 40 mg by mouth daily.  ? Baclofen 5 MG TABS Take 5 mg by mouth 3 (three) times daily. Nontraumatic intracerebral hemorrhage in hemisphere, subcortical  ? Balsam [place]-Castor Oil (VENELEX) OINT Apply topically. Special Instructions: Apply to sacrum, coccyx and bilateral buttocks qshift for prevention/ redness. ?Every Shift ?Day, Evening, Night  ? BD AUTOSHIELD DUO 30G X 5 MM MISC 3/16"  ? Eyelid Cleansers (OCUSOFT LID SCRUB EX) daily. Cleanse both eye lids  ? Glucerna (GLUCERNA) LIQD Take 237 mLs by mouth daily.  ? hydrocortisone cream 1 % Apply 1 application topically as needed (for hemorrhoids).  ? insulin glargine (LANTUS) 100 UNIT/ML injection Inject 20 Units into the skin daily.  ? insulin lispro (HUMALOG) 100 UNIT/ML cartridge Inject 5 Units into the skin in the morning and at bedtime. Give 5 units  with breakfast and lunch  ? lansoprazole (PREVACID SOLUTAB) 30 MG disintegrating tablet Take 30 mg by mouth daily. Give before meals  ? loperamide (IMODIUM A-D) 2 MG tablet Take 2 mg by mouth as needed for diarrhea or loose stools. May give another dose after each loose stool,  up to 8 mg per 24 hours.  ? loratadine (CLARITIN) 10 MG tablet Take 10 mg by mouth daily.  ? losartan (COZAAR) 25 MG tablet Take 25 mg by mouth daily. DX: Hypertensive heart and chronic kidney disease without heart failure, with stage 1 through stage 4 chronic kidney disease, or unspecified chronic kidney disease-per NP note, code updated  ? Melatonin 1 MG TABS Take 2 tablets by mouth at bedtime.   ? NON FORMULARY Diet Change: Regular, thin liquids  ? ondansetron (ZOFRAN-ODT) 4 MG disintegrating tablet Take 4 mg by mouth every 6 (six) hours as needed for nausea or  vomiting.  ? oxybutynin (DITROPAN XL) 15 MG 24 hr tablet Take 15 mg by mouth daily.  ? polyethylene glycol (MIRALAX / GLYCOLAX) 17 g packet Take 17 g by mouth daily as needed.  ? rOPINIRole (REQUIP) 0.25 MG tablet Take 0.25 mg by mouth at bedtime.  ? ?No facility-administered encounter medications on file as of 04/15/2022.  ? ? ? ?SIGNIFICANT DIAGNOSTIC EXAMS ? ?PREVIOUS  ? ?05-12-21: right lower extremity venous doppler: preliminary report: clot from groin to foot.  ? ?05-12-21: right lower extremity venous doppler (From ED):  ? 1. Extensive acute mostly occlusive deep venous thrombosis extending from the right common femoral vein into the calf veins. ? ?TODAY ? ?03-13-22: chest x-ray: left base infiltrate   ? ? ?LABS REVIEWED PREVIOUS:  ?   ?03-25-21 hgb a1c 6.6 ?05-11-21: urine for micro albumin 1299.7 (413.2) on ARB   ?05-12-21: wbc 10.6; hgb `15.2; hct 47.9; mcv 95.0 plt 181; glucose 233; bun 49; creat 1.98; k+ 4.4; na++ 137; ca 8.2; GFR34 ?05-14-21: glucose 116; bun 32; creat 1.68; k+ 4.1; na++ 139; ca 8.2; GFR 42 ?05-15-21: wbc 7.5; hgb 14.7; hct 44.6; mcv 89.7 plt 182  ?05-21-21: wbc 8.6; hgb 13.6; hct 42.9; mcv 93.5 plt 209; glucose 147; bun 35; creat 1.75; k+ 4.0 na++ 8.1; GFR 40 ?07-23-21: wbc 7.4; hgb 15.8; hct 50.3; mcv 94.7 plt 189; urine culture: enterococcus faecalis/providencia stuartii  ?08-09-21: wbc 8.3; hgb 14.5; hct 45.3; mcv 93.8 plt 192; glucose 127;  bun 34; creat 1.83; k+ 4.1; na++ 140; ca 7.8; GFR 38; alk phos 130; albumin 2.6 urine culture: pseudomonas aeruginosa: cipro ?09-09-21: hgb a1c 6.5; chol 95; ldl 58; trig 75; hdl 22; PSA 0.37 ?09-13-21: wbc

## 2022-04-15 NOTE — Progress Notes (Signed)
?Location:  [place] [facility] Room Number: 130-P ?Place of Service:  SNF (31) ? ? ?CODE STATUS: DNR ? ?No Known Allergies ? ?Chief Complaint  ?Patient presents with  ? Acute Visit  ?  Care plan meeting  ? ? ?HPI: ? ? ? ?Past Medical History:  ?Diagnosis Date  ? A-fib ([place])   ? a. Post-op afib after CABG 08/2012.  ? Acute gastric ulcer with hemorrhage   ? Acute respiratory failure with hypoxia ([place])   ? Aphasia following cerebral infarction   ? CAD (coronary artery disease)   ? a. NSTEMI s/p stent to distal RCA 03/2000. b. Inf MI s/p emergent thrombectomy/stenting mid RCA 10/2000. c. NSTEMI s/p CABGx4 (LIMA-LAD, SVG-OM2, seq SVG-acute marginal and PD) 01/15/39 - course complicated by confusion, post-op AF, L pleural effusion with thoracentesis. d. Normal LV function by echo 08/2012.  ? Diverticulosis   ? DM with CKD   ? Dysphagia following cerebral infarction   ? Encephalopathy   ? Extended spectrum beta lactamase (ESBL) resistance   ? Generalized anxiety disorder   ? GERD (gastroesophageal reflux disease)   ? Hemiplegia and hemiparesis following cerebral infarction affecting right dominant side ([place])   ? Hiatal hernia   ? HLD (hyperlipidemia)   ? HTN (hypertension)   ? Non-ST elevation (NSTEMI) myocardial infarction [facility])   ? Nontraumatic intracerebral hemorrhage in hemisphere, subcortical ([place])   ? Peripheral vascular disease ([place])   ? a. Carotid dopplers neg 08/13/12. b. Pre-cabg ABIs - R=0.87 suggesting mild dz, L=1.29 possibly falsely elevated due to calcified vessels.  ? Pleural effusion   ? a. L pleural eff after CABG s/p thoracentesis 08/22/12.  ? Prostate cancer ([place])   ? Status post radiation treatment.  ? Prostatic hypertrophy   ? a. Hx of urinary retention, awaiting TURP.  ? Severe protein-calorie malnutrition ([place])   ? Stroke [facility])   ? Tobacco abuse   ? 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 Valvular heart disease   ? a. Mild  MR by TEE 08/2012.  ? ? ?Past Surgical History:   ?Procedure Laterality Date  ? APPENDECTOMY    ? BACK SURGERY    ? CORONARY ARTERY BYPASS GRAFT  08/16/2012  ? Procedure: CORONARY ARTERY BYPASS GRAFTING (CABG);  Surgeon: Melrose Nakayama, MD;  Location: [place];  Service: Open Heart Surgery;  Laterality: N/A;  ? IR REPLC GASTRO/COLONIC TUBE PERCUT W/FLUORO  02/13/2019  ? LEFT HEART CATHETERIZATION WITH CORONARY ANGIOGRAM N/A 08/14/2012  ? Procedure: LEFT HEART CATHETERIZATION WITH CORONARY ANGIOGRAM;  Surgeon: Peter M Martinique, MD;  Location: [facility] CATH LAB;  Service: Cardiovascular;  Laterality: N/A;  ? ? ?Social History  ? ?Socioeconomic History  ? Marital status: Married  ?  Spouse name: Not on file  ? Number of children: Not on file  ? Years of education: Not on file  ? Highest education level: Not on file  ?Occupational History  ? Occupation: retired   ?Tobacco Use  ? Smoking status: Former  ?  Types: Cigarettes  ?  Quit date: 11/12/1972  ?  Years since quitting: 49.4  ? Smokeless tobacco: Never  ?Vaping Use  ? Vaping Use: Never used  ?Substance and Sexual Activity  ? Alcohol use: No  ? Drug use: No  ? Sexual activity: Not Currently  ?Other Topics Concern  ? Not on file  ?Social History Narrative  ? He is a long term patient of [facility]   ? ?Social Determinants of  Health  ? ?Financial Resource Strain: Not on file  ?Food Insecurity: Not on file  ?Transportation Needs: Not on file  ?Physical Activity: Not on file  ?Stress: Not on file  ?Social Connections: Not on file  ?Intimate Partner Violence: Not on file  ? ?Family History  ?Problem Relation Age of Onset  ? Hypertension Mother   ? ? ? ? ?VITAL SIGNS ?BP 122/79   Pulse 70   Temp 98.3 ?F (36.8 ?C)   Resp 20   Ht '5\' 11"'$  (1.803 m)   Wt 172 lb 1.6 oz (78.1 kg)   SpO2 95%   BMI 24.00 kg/m?  ? ?Outpatient Encounter Medications as of 04/15/2022  ?Medication Sig  ? acetaminophen (TYLENOL) 325 MG tablet Take 650 mg by mouth every 4 (four) hours as needed. per standing order for pain/fever DO NOT EXCEED >3,000 mg in 24 hours  ?  amLODipine (NORVASC) 5 MG tablet Take 5 mg by mouth daily.   ? apixaban (ELIQUIS) 5 MG TABS tablet Take 5 mg by mouth 2 (two) times daily.  ? atorvastatin (LIPITOR) 40 MG tablet Take 40 mg by mouth daily.  ? Baclofen 5 MG TABS Take 5 mg by mouth 3 (three) times daily. Nontraumatic intracerebral hemorrhage in hemisphere, subcortical  ? Balsam [place]-Castor Oil (VENELEX) OINT Apply topically. Special Instructions: Apply to sacrum, coccyx and bilateral buttocks qshift for prevention/ redness. ?Every Shift ?Day, Evening, Night  ? BD AUTOSHIELD DUO 30G X 5 MM MISC 3/16"  ? Eyelid Cleansers (OCUSOFT LID SCRUB EX) daily. Cleanse both eye lids  ? Glucerna (GLUCERNA) LIQD Take 237 mLs by mouth daily.  ? hydrocortisone cream 1 % Apply 1 application topically as needed (for hemorrhoids).  ? insulin glargine (LANTUS) 100 UNIT/ML injection Inject 20 Units into the skin daily.  ? insulin lispro (HUMALOG) 100 UNIT/ML cartridge Inject 5 Units into the skin in the morning and at bedtime. Give 5 units  with breakfast and lunch  ? lansoprazole (PREVACID SOLUTAB) 30 MG disintegrating tablet Take 30 mg by mouth daily. Give before meals  ? loperamide (IMODIUM A-D) 2 MG tablet Take 2 mg by mouth as needed for diarrhea or loose stools. May give another dose after each loose stool, up to 8 mg per 24 hours.  ? loratadine (CLARITIN) 10 MG tablet Take 10 mg by mouth daily.  ? losartan (COZAAR) 25 MG tablet Take 25 mg by mouth daily. DX: Hypertensive heart and chronic kidney disease without heart failure, with stage 1 through stage 4 chronic kidney disease, or unspecified chronic kidney disease-per NP note, code updated  ? Melatonin 1 MG TABS Take 2 tablets by mouth at bedtime.   ? miconazole (MICOTIN) 2 % cream Apply 1 application. topically 3 (three) times daily. apply to scrotum until resolved  ? NON FORMULARY Diet Change: Regular, thin liquids  ? ondansetron (ZOFRAN-ODT) 4 MG disintegrating tablet Take 4 mg by mouth every 6 (six) hours as  needed for nausea or vomiting.  ? oxybutynin (DITROPAN XL) 15 MG 24 hr tablet Take 15 mg by mouth daily. 5 mg; amt: 1 tablet; oral ?Special Instructions: give along with '15mg'$  to equal '20mg'$  for bladder spasms  ? polyethylene glycol (MIRALAX / GLYCOLAX) 17 g packet Take 17 g by mouth daily as needed.  ? rOPINIRole (REQUIP) 0.25 MG tablet Take 0.25 mg by mouth at bedtime.  ? [DISCONTINUED] amoxicillin-clavulanate (AUGMENTIN) 875-125 MG tablet Take 1 tablet by mouth 2 (two) times daily. COVID pneumonia  ? ?No facility-administered encounter  medications on file as of 04/15/2022.  ? ? ? ?SIGNIFICANT DIAGNOSTIC EXAMS ? ? ? ? ? ? ?ASSESSMENT/ PLAN: ? ? ? ? ?Ok Edwards NP ?[facility]  ?Contact [phone] Monday through Friday 8am- 5pm  ?After hours call [phone]  ? ?

## 2022-04-27 ENCOUNTER — Non-Acute Institutional Stay (SKILLED_NURSING_FACILITY): Payer: Medicare PPO | Admitting: Adult Health

## 2022-04-27 ENCOUNTER — Encounter: Payer: Self-pay | Admitting: Adult Health

## 2022-04-27 DIAGNOSIS — U071 COVID-19: Secondary | ICD-10-CM

## 2022-04-27 DIAGNOSIS — G2581 Restless legs syndrome: Secondary | ICD-10-CM

## 2022-04-27 DIAGNOSIS — E1129 Type 2 diabetes mellitus with other diabetic kidney complication: Secondary | ICD-10-CM

## 2022-04-27 DIAGNOSIS — E1122 Type 2 diabetes mellitus with diabetic chronic kidney disease: Secondary | ICD-10-CM | POA: Diagnosis not present

## 2022-04-27 DIAGNOSIS — E1159 Type 2 diabetes mellitus with other circulatory complications: Secondary | ICD-10-CM | POA: Diagnosis not present

## 2022-04-27 DIAGNOSIS — G8111 Spastic hemiplegia affecting right dominant side: Secondary | ICD-10-CM

## 2022-04-27 DIAGNOSIS — R339 Retention of urine, unspecified: Secondary | ICD-10-CM | POA: Diagnosis not present

## 2022-04-27 DIAGNOSIS — E1169 Type 2 diabetes mellitus with other specified complication: Secondary | ICD-10-CM

## 2022-04-27 DIAGNOSIS — E43 Unspecified severe protein-calorie malnutrition: Secondary | ICD-10-CM

## 2022-04-27 DIAGNOSIS — I700 Atherosclerosis of aorta: Secondary | ICD-10-CM

## 2022-04-27 DIAGNOSIS — Z86718 Personal history of other venous thrombosis and embolism: Secondary | ICD-10-CM

## 2022-04-27 DIAGNOSIS — E785 Hyperlipidemia, unspecified: Secondary | ICD-10-CM

## 2022-04-27 DIAGNOSIS — E1151 Type 2 diabetes mellitus with diabetic peripheral angiopathy without gangrene: Secondary | ICD-10-CM

## 2022-04-27 DIAGNOSIS — I610 Nontraumatic intracerebral hemorrhage in hemisphere, subcortical: Secondary | ICD-10-CM | POA: Diagnosis not present

## 2022-04-27 DIAGNOSIS — I152 Hypertension secondary to endocrine disorders: Secondary | ICD-10-CM

## 2022-04-27 DIAGNOSIS — N183 Chronic kidney disease, stage 3 unspecified: Secondary | ICD-10-CM

## 2022-04-27 DIAGNOSIS — I6930 Unspecified sequelae of cerebral infarction: Secondary | ICD-10-CM

## 2022-04-27 DIAGNOSIS — N3289 Other specified disorders of bladder: Secondary | ICD-10-CM

## 2022-04-27 DIAGNOSIS — F323 Major depressive disorder, single episode, severe with psychotic features: Secondary | ICD-10-CM

## 2022-04-27 DIAGNOSIS — R809 Proteinuria, unspecified: Secondary | ICD-10-CM

## 2022-04-28 ENCOUNTER — Encounter: Payer: Self-pay | Admitting: Adult Health

## 2022-04-28 DIAGNOSIS — Z86718 Personal history of other venous thrombosis and embolism: Secondary | ICD-10-CM | POA: Insufficient documentation

## 2022-04-28 NOTE — Progress Notes (Signed)
Location:  [place] Room Number: 459X Place of Service:  SNF (31)   CODE STATUS: dnr   No Known Allergies  Chief Complaint  Patient presents with   Annual Exam    HPI:  He is a 76 year old long term resident of this facility being seen for his annual exam. There have been no hospitalizations. He has been to the ED one time over the past year for hematuria. He continues to get out of bed daily. There are no reports of uncontrolled pain. He continues to be followed for his chronic illnesses including:    Type 2 diabetes mellitus with peripheral vascular disease:Protein calorie malnutrition severe:  Nontraumatic subcortical hemorrhage left cerebral hemisphere/ right spastic hemiplegia/  Aortic atherosclerosis  Past Medical History:  Diagnosis Date   A-fib ([place])    a. Post-op afib after CABG 08/2012.   Acute gastric ulcer with hemorrhage    Acute respiratory failure with hypoxia ([facility])    Aphasia following cerebral infarction    CAD (coronary artery disease)    a. NSTEMI s/p stent to distal RCA 03/2000. b. Inf MI s/p emergent thrombectomy/stenting mid RCA 10/2000. c. NSTEMI s/p CABGx4 (LIMA-LAD, SVG-OM2, seq SVG-acute marginal and PD) 06/17/40 - course complicated by confusion, post-op AF, L pleural effusion with thoracentesis. d. Normal LV function by echo 08/2012.   Diverticulosis    DM with CKD    Dysphagia following cerebral infarction    Encephalopathy    Extended spectrum beta lactamase (ESBL) resistance    Generalized anxiety disorder    GERD (gastroesophageal reflux disease)    Hemiplegia and hemiparesis following cerebral infarction affecting right dominant side ([facility])    Hiatal hernia    HLD (hyperlipidemia)    HTN (hypertension)    Non-ST elevation (NSTEMI) myocardial infarction [facility])    Nontraumatic intracerebral hemorrhage in hemisphere, subcortical [facility])    Peripheral vascular disease ([place])    a. Carotid dopplers neg 08/13/12. b. Pre-cabg ABIs -  R=0.87 suggesting mild dz, L=1.29 possibly falsely elevated due to calcified vessels.   Pleural effusion    a. L pleural eff after CABG s/p thoracentesis 08/22/12.   Prostate cancer [facility])    Status post radiation treatment.   Prostatic hypertrophy    a. Hx of urinary retention, awaiting TURP.   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 Mild  MR by TEE 08/2012.    Past Surgical History:  Procedure Laterality Date   APPENDECTOMY     BACK SURGERY     CORONARY ARTERY BYPASS GRAFT  08/16/2012   Procedure: CORONARY ARTERY BYPASS GRAFTING (CABG);  Surgeon: Melrose Nakayama, MD;  Location: [place];  Service: Open Heart Surgery;  Laterality: N/A;   IR REPLC GASTRO/COLONIC TUBE PERCUT W/FLUORO  02/13/2019   LEFT HEART CATHETERIZATION WITH CORONARY ANGIOGRAM N/A 08/14/2012   Procedure: LEFT HEART CATHETERIZATION WITH CORONARY ANGIOGRAM;  Surgeon: Peter M Martinique, MD;  Location: [facility] CATH LAB;  Service: Cardiovascular;  Laterality: N/A;    Social History   Socioeconomic History   Marital status: Married    Spouse name: Not on file   Number of children: Not on file   Years of education: Not on file   Highest education level: Not on file  Occupational History   Occupation: retired   Tobacco Use   Smoking  status: Former    Types: Cigarettes    Quit date: 11/12/1972    Years since quitting: 49.4   Smokeless tobacco: Never  Vaping Use   Vaping Use: Never used  Substance and Sexual Activity   Alcohol use: No   Drug use: No   Sexual activity: Not Currently  Other Topics Concern   Not on file  Social History Narrative   He is a long term patient of [facility]    Social Determinants of Health   Financial Resource Strain: Not on file  Food Insecurity: Not on file  Transportation Needs: Not on file  Physical Activity: Not on file  Stress: Not on file  Social Connections: Not  on file  Intimate Partner Violence: Not on file   Family History  Problem Relation Age of Onset   Hypertension Mother       VITAL SIGNS BP 120/70   Pulse 80   Temp 98.3 F (36.8 C)   Ht 5' 11"  (1.803 m)   Wt 172 lb 1.6 oz (78.1 kg)   BMI 24.00 kg/m   Outpatient Encounter Medications as of 04/27/2022  Medication Sig   acetaminophen (TYLENOL) 325 MG tablet Take 650 mg by mouth every 4 (four) hours as needed. per standing order for pain/fever DO NOT EXCEED >3,000 mg in 24 hours   amLODipine (NORVASC) 5 MG tablet Take 5 mg by mouth daily.    apixaban (ELIQUIS) 5 MG TABS tablet Take 5 mg by mouth 2 (two) times daily.   atorvastatin (LIPITOR) 40 MG tablet Take 40 mg by mouth daily.   Baclofen 5 MG TABS Take 5 mg by mouth 3 (three) times daily. Nontraumatic intracerebral hemorrhage in hemisphere, subcortical   Balsam [place]-Castor Oil (VENELEX) OINT Apply topically. Special Instructions: Apply to sacrum, coccyx and bilateral buttocks qshift for prevention/ redness. Every Shift Day, Evening, Night   BD AUTOSHIELD DUO 30G X 5 MM MISC 3/16"   Eyelid Cleansers (OCUSOFT LID SCRUB EX) daily. Cleanse both eye lids   Glucerna (GLUCERNA) LIQD Take 237 mLs by mouth daily.   hydrocortisone cream 1 % Apply 1 application topically as needed (for hemorrhoids).   insulin glargine (LANTUS) 100 UNIT/ML injection Inject 20 Units into the skin daily.   insulin lispro (HUMALOG) 100 UNIT/ML cartridge Inject 5 Units into the skin in the morning and at bedtime. Give 5 units  with breakfast and lunch   lansoprazole (PREVACID SOLUTAB) 30 MG disintegrating tablet Take 30 mg by mouth daily. Give before meals   loperamide (IMODIUM A-D) 2 MG tablet Take 2 mg by mouth as needed for diarrhea or loose stools. May give another dose after each loose stool, up to 8 mg per 24 hours.   loratadine (CLARITIN) 10 MG tablet Take 10 mg by mouth daily.   losartan (COZAAR) 25 MG tablet Take 25 mg by mouth daily. DX: Hypertensive  heart and chronic kidney disease without heart failure, with stage 1 through stage 4 chronic kidney disease, or unspecified chronic kidney disease-per NP note, code updated   Melatonin 1 MG TABS Take 2 tablets by mouth at bedtime.    miconazole (MICOTIN) 2 % cream Apply 1 application. topically 3 (three) times daily. apply to scrotum until resolved   NON FORMULARY Diet Change: Regular, thin liquids   ondansetron (ZOFRAN-ODT) 4 MG disintegrating tablet Take 4 mg by mouth every 6 (six) hours as needed for nausea or vomiting.   oxybutynin (DITROPAN XL) 15 MG 24 hr tablet Take 15 mg by  mouth daily. 5 mg; amt: 1 tablet; oral Special Instructions: give along with 82m to equal 270mfor bladder spasms   oxybutynin (DITROPAN) 5 MG tablet Take 5 mg by mouth. Take with 15 mg to equal 20 mg   polyethylene glycol (MIRALAX / GLYCOLAX) 17 g packet Take 17 g by mouth daily as needed.   rOPINIRole (REQUIP) 0.25 MG tablet Take 0.25 mg by mouth at bedtime.   No facility-administered encounter medications on file as of 04/27/2022.     SIGNIFICANT DIAGNOSTIC EXAMS  PREVIOUS   05-12-21: right lower extremity venous doppler: preliminary report: clot from groin to foot.   05-12-21: right lower extremity venous doppler (From ED):   1. Extensive acute mostly occlusive deep venous thrombosis extending from the right common femoral vein into the calf veins.  TODAY  03-01-22: right lower extremity venous doppler: no evidence of dvt within visualized vein of right lower extremity   LABS REVIEWED PREVIOUS:    05-11-21: urine for micro albumin 1299.7 (413.2) on ARB   05-12-21: wbc 10.6; hgb `15.2; hct 47.9; mcv 95.0 plt 181; glucose 233; bun 49; creat 1.98; k+ 4.4; na++ 137; ca 8.2; GFR34 05-14-21: glucose 116; bun 32; creat 1.68; k+ 4.1; na++ 139; ca 8.2; GFR 42 05-15-21: wbc 7.5; hgb 14.7; hct 44.6; mcv 89.7 plt 182  05-21-21: wbc 8.6; hgb 13.6; hct 42.9; mcv 93.5 plt 209; glucose 147; bun 35; creat 1.75; k+ 4.0 na++ 8.1;  GFR 40 07-23-21: wbc 7.4; hgb 15.8; hct 50.3; mcv 94.7 plt 189; urine culture: enterococcus faecalis/providencia stuartii  08-09-21: wbc 8.3; hgb 14.5; hct 45.3; mcv 93.8 plt 192; glucose 127; bun 34; creat 1.83; k+ 4.1; na++ 140; ca 7.8; GFR 38; alk phos 130; albumin 2.6 urine culture: pseudomonas aeruginosa: cipro 09-09-21: hgb a1c 6.5; chol 95; ldl 58; trig 75; hdl 22; PSA 0.37 09-13-21: wbc 7.4; hgb 15.3; hct 48.5; mcv 94.0 plt 180; glucose 247; bun 43; creat 2.11; k+ 4.7; na++ 139; ca 8.3; GFR 32; d-dimer <0.27; CRP 2.34 09-29-21: wbc 8.0; hgb 14.9; hct 46.9; mcv 94.4 plt 170; glucose 168; bun 50; creat 2.04; k+ 4.5; na++ 134; ca 7.9; GFR 33; urine culture: no growth  12-07-21: urine culture: mixed bacteria 01-03-22: glucose 71; bun 54; creat 2.15; k+ 4.4; na++ 138; ca 8.7; GFR 31; liver normal albumin 3.2; hgb a1c 6.2  01-10-22: glucose 89; bun 49; creat 1.90; k+ 4.2; na++ 141; ca 8.7; GFR 36 03-13-22: wbc 8.0; hgb 16.0; hct 51.2; mcv 993.6 plt 148; glucose 156; bun 47; creat 2.50; k+ 4.7; na++ 140; ca 8.7; GFR 26 CRP 6.4; d-dimer 0.41  NO NEW LABS.   Review of Systems  Reason unable to perform ROS: aphasia.   Physical Exam Constitutional:      General: He is not in acute distress.    Appearance: He is well-developed. He is not diaphoretic.  Neck:     Thyroid: No thyromegaly.  Cardiovascular:     Rate and Rhythm: Normal rate and regular rhythm.     Pulses: Normal pulses.     Heart sounds: Normal heart sounds.  Pulmonary:     Effort: Pulmonary effort is normal. No respiratory distress.     Breath sounds: Normal breath sounds.  Abdominal:     General: Bowel sounds are normal. There is no distension.     Palpations: Abdomen is soft.     Tenderness: There is no abdominal tenderness.  Genitourinary:    Comments: Foley  Musculoskeletal:  Cervical back: Neck supple.     Right lower leg: No edema.     Left lower leg: No edema.     Comments:  Right hemiparesis    Lymphadenopathy:      Cervical: No cervical adenopathy.  Skin:    General: Skin is warm and dry.  Neurological:     Mental Status: He is alert. Mental status is at baseline.  Psychiatric:        Mood and Affect: Mood normal.     ASSESSMENT/ PLAN:  TODAY:   Chronic urine retention/bladder spasms: has long term foley; will continue ditropan xl 20 mg daily for spasms  2. Type 2 diabetes mellitus with peripheral vascular disease: is stable hgb 6.7; will continue lantus 20 units nightly humalog 10 units with meals; is on statin arb and eliquis  3. Protein calorie malnutrition severe: weight is 172; albumin 2.2 will continue supplements as directed   4. Nontraumatic subcortical hemorrhage left cerebral hemisphere/ right spastic hemiplegia/ stable will continue baclofen 5 mg three times  daily for spasticity   5. Aortic atherosclerosis (ct 01-04-19)  is on statin and eliquis will monitor   6. History of DVT: will stop eliquis   7. CAD native heart without angina: is status post CABG 2012. Will continue cozaar 25 mg daily   8. Hypertension associated with type 2 diabetes mellitus: is stable b/p 120/70 will continue norvasc 5 mg daily cozaar 50 mg daily  9. Dyslipidemia associated with type 2 diabetes mellitus; is stable ldl 77 will continue lipitor 40 mgm daily   10. Micro-albuminuria due to type 2 diabetes mellitus: is without change 1299.7 is on ARB  11. Major depression with psychotic features: is stable and presently off medications   12. CKD stage 3 due to type 2 diabetes mellitus: is stable bun 49; creat 1.94 GFR 36    Ok Edwards NP [facility]   call [phone]

## 2022-05-04 DIAGNOSIS — R3914 Feeling of incomplete bladder emptying: Secondary | ICD-10-CM | POA: Diagnosis not present

## 2022-05-04 DIAGNOSIS — N401 Enlarged prostate with lower urinary tract symptoms: Secondary | ICD-10-CM | POA: Diagnosis not present

## 2022-05-11 DIAGNOSIS — F5105 Insomnia due to other mental disorder: Secondary | ICD-10-CM | POA: Diagnosis not present

## 2022-05-11 DIAGNOSIS — F333 Major depressive disorder, recurrent, severe with psychotic symptoms: Secondary | ICD-10-CM | POA: Diagnosis not present

## 2022-05-18 DIAGNOSIS — Z961 Presence of intraocular lens: Secondary | ICD-10-CM | POA: Diagnosis not present

## 2022-05-18 DIAGNOSIS — H2511 Age-related nuclear cataract, right eye: Secondary | ICD-10-CM | POA: Diagnosis not present

## 2022-05-18 DIAGNOSIS — E113293 Type 2 diabetes mellitus with mild nonproliferative diabetic retinopathy without macular edema, bilateral: Secondary | ICD-10-CM | POA: Diagnosis not present

## 2022-05-18 DIAGNOSIS — H524 Presbyopia: Secondary | ICD-10-CM | POA: Diagnosis not present

## 2022-05-26 ENCOUNTER — Encounter: Payer: Self-pay | Admitting: Adult Health

## 2022-05-26 ENCOUNTER — Non-Acute Institutional Stay (SKILLED_NURSING_FACILITY): Payer: Medicare PPO | Admitting: Adult Health

## 2022-05-26 DIAGNOSIS — R339 Retention of urine, unspecified: Secondary | ICD-10-CM | POA: Diagnosis not present

## 2022-05-26 DIAGNOSIS — I6930 Unspecified sequelae of cerebral infarction: Secondary | ICD-10-CM

## 2022-05-26 DIAGNOSIS — G8111 Spastic hemiplegia affecting right dominant side: Secondary | ICD-10-CM

## 2022-05-26 DIAGNOSIS — E1151 Type 2 diabetes mellitus with diabetic peripheral angiopathy without gangrene: Secondary | ICD-10-CM

## 2022-05-26 NOTE — Progress Notes (Signed)
Location:  [place] Room Number: 854-O Place of Service:  SNF (31)   CODE STATUS: DNR  No Known Allergies  Chief Complaint  Patient presents with   Medical Management of Chronic Issues                                Chronic urine retention/bladder spasms: Type 2 diabetes mellitus with peripheral vascular disease: Protein calorie malnutrition severe: Nontraumatic subcortical hemorrhage left cerebral hemisphere/ right spastic hemiplegia/    HPI:  He is a 76 year old long term resident of this facility being seen for the management of his chronic illnesses:  Chronic urine retention/bladder spasms: Type 2 diabetes mellitus with peripheral vascular disease: Protein calorie malnutrition severe: Nontraumatic subcortical hemorrhage left cerebral hemisphere/ right spastic hemiplegia. He is not being followed by therapy. There are no reports of uncontrolled pain. There are no reports of anxiety or depressive thoughts.   Past Medical History:  Diagnosis Date   A-fib [facility])    a. Post-op afib after CABG 08/2012.   Acute gastric ulcer with hemorrhage    Acute respiratory failure with hypoxia (HCC)    Aphasia following cerebral infarction    CAD (coronary artery disease)    a. NSTEMI s/p stent to distal RCA 03/2000. b. Inf MI s/p emergent thrombectomy/stenting mid RCA 10/2000. c. NSTEMI s/p CABGx4 (LIMA-LAD, SVG-OM2, seq SVG-acute marginal and PD) 01/18/02 - course complicated by confusion, post-op AF, L pleural effusion with thoracentesis. d. Normal LV function by echo 08/2012.   Diverticulosis    DM with CKD    Dysphagia following cerebral infarction    Encephalopathy    Extended spectrum beta lactamase (ESBL) resistance    Generalized anxiety disorder    GERD (gastroesophageal reflux disease)    Hemiplegia and hemiparesis following cerebral infarction affecting right dominant side (HCC)    Hiatal hernia    HLD (hyperlipidemia)    HTN (hypertension)    Non-ST elevation  (NSTEMI) myocardial infarction [facility])    Nontraumatic intracerebral hemorrhage in hemisphere, subcortical [facility])    Peripheral vascular disease ([place])    a. Carotid dopplers neg 08/13/12. b. Pre-cabg ABIs - R=0.87 suggesting mild dz, L=1.29 possibly falsely elevated due to calcified vessels.   Pleural effusion    a. L pleural eff after CABG s/p thoracentesis 08/22/12.   Prostate cancer [facility])    Status post radiation treatment.   Prostatic hypertrophy    a. Hx of urinary retention, awaiting TURP.   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 Mild  MR by TEE 08/2012.    Past Surgical History:  Procedure Laterality Date   APPENDECTOMY     BACK SURGERY     CORONARY ARTERY BYPASS GRAFT  08/16/2012   Procedure: CORONARY ARTERY BYPASS GRAFTING (CABG);  Surgeon: Melrose Nakayama, MD;  Location: [place];  Service: Open Heart Surgery;  Laterality: N/A;   IR REPLC GASTRO/COLONIC TUBE PERCUT W/FLUORO  02/13/2019   LEFT HEART CATHETERIZATION WITH CORONARY ANGIOGRAM N/A 08/14/2012   Procedure: LEFT HEART CATHETERIZATION WITH CORONARY ANGIOGRAM;  Surgeon: Peter M Martinique, MD;  Location: [facility] CATH LAB;  Service: Cardiovascular;  Laterality: N/A;    Social History   Socioeconomic History   Marital status: Married    Spouse name: Not on  file   Number of children: Not on file   Years of education: Not on file   Highest education level: Not on file  Occupational History   Occupation: retired   Tobacco Use   Smoking status: Former    Types: Cigarettes    Quit date: 11/12/1972    Years since quitting: 49.5   Smokeless tobacco: Never  Vaping Use   Vaping Use: Never used  Substance and Sexual Activity   Alcohol use: No   Drug use: No   Sexual activity: Not Currently  Other Topics Concern   Not on file  Social History Narrative   He is a long term patient of [facility]    Social  Determinants of Health   Financial Resource Strain: Low Risk  (07/01/2019)   Overall Financial Resource Strain (CARDIA)    Difficulty of Paying Living Expenses: Not hard at all  Food Insecurity: Unknown (07/01/2019)   Hunger Vital Sign    Worried About Learned in the Last Year: Patient refused    [place] in the Last Year: Patient refused  Transportation Needs: Unknown (07/01/2019)   PRAPARE - Transportation    Lack of Transportation (Medical): Patient refused    Lack of Transportation (Non-Medical): Patient refused  Physical Activity: Inactive (10/30/2019)   Exercise Vital Sign    Days of Exercise per Week: 0 days    Minutes of Exercise per Session: 0 min  Stress: Unknown (07/01/2019)   [place] of Stress : Patient refused  Social Connections: Unknown (07/01/2019)   Social Connection and Isolation Panel [NHANES]    Frequency of Communication with Friends and Family: Patient refused    Frequency of Social Gatherings with Friends and Family: Patient refused    Attends Religious Services: Patient refused    Active Member of Clubs or Organizations: Patient refused    Attends Archivist Meetings: Patient refused    Marital Status: Patient refused  Intimate Partner Violence: Unknown (07/01/2019)   Humiliation, Afraid, Rape, and Kick questionnaire    Fear of Current or Ex-Partner: Patient refused    Emotionally Abused: Patient refused    Physically Abused: Patient refused    Sexually Abused: Patient refused   Family History  Problem Relation Age of Onset   Hypertension Mother       VITAL SIGNS BP 129/68   Pulse 86   Temp (!) 97.3 F (36.3 C)   Resp 20   Ht 5' 11"  (1.803 m)   Wt 170 lb 6.4 oz (77.3 kg)   SpO2 95%   BMI 23.77 kg/m   Outpatient Encounter Medications as of 05/26/2022  Medication Sig   acetaminophen (TYLENOL) 325 MG tablet Take 650 mg by mouth every 4  (four) hours as needed. per standing order for pain/fever DO NOT EXCEED >3,000 mg in 24 hours   amLODipine (NORVASC) 5 MG tablet Take 5 mg by mouth daily.    atorvastatin (LIPITOR) 40 MG tablet Take 40 mg by mouth daily.   Baclofen 5 MG TABS Take 5 mg by mouth 3 (three) times daily. Nontraumatic intracerebral hemorrhage in hemisphere, subcortical   Balsam [place]-Castor Oil (VENELEX) OINT Apply topically. Special Instructions: Apply to sacrum, coccyx and bilateral buttocks qshift for prevention/ redness. Every Shift Day, Evening, Night   BD AUTOSHIELD DUO 30G X 5 MM MISC 3/16"   Eyelid Cleansers (OCUSOFT LID SCRUB EX) daily. Cleanse both eye lids  Glucerna (GLUCERNA) LIQD Take 237 mLs by mouth daily.   hydrocortisone cream 1 % Apply 1 application topically as needed (for hemorrhoids).   insulin glargine (LANTUS) 100 UNIT/ML injection Inject 20 Units into the skin daily.   insulin lispro (HUMALOG) 100 UNIT/ML cartridge Inject 5 Units into the skin in the morning and at bedtime. Give 5 units  with breakfast and lunch   lansoprazole (PREVACID SOLUTAB) 30 MG disintegrating tablet Take 30 mg by mouth daily. Give before meals   loperamide (IMODIUM A-D) 2 MG tablet Take 2 mg by mouth as needed for diarrhea or loose stools. May give another dose after each loose stool, up to 8 mg per 24 hours.   loratadine (CLARITIN) 10 MG tablet Take 10 mg by mouth daily.   losartan (COZAAR) 25 MG tablet Take 25 mg by mouth daily. DX: Hypertensive heart and chronic kidney disease without heart failure, with stage 1 through stage 4 chronic kidney disease, or unspecified chronic kidney disease-per NP note, code updated   Melatonin 1 MG TABS Take 2 tablets by mouth at bedtime.    miconazole (MICOTIN) 2 % cream Apply 1 application. topically 3 (three) times daily. apply to scrotum until resolved   NON FORMULARY Diet Change: Regular, thin liquids   ondansetron (ZOFRAN-ODT) 4 MG disintegrating tablet Take 4 mg by mouth every 6  (six) hours as needed for nausea or vomiting.   oxybutynin (DITROPAN XL) 15 MG 24 hr tablet Take 15 mg by mouth daily. 5 mg; amt: 1 tablet; oral Special Instructions: give along with 55m to equal 293mfor bladder spasms   oxybutynin (DITROPAN) 5 MG tablet Take 5 mg by mouth. Take with 15 mg to equal 20 mg   polyethylene glycol (MIRALAX / GLYCOLAX) 17 g packet Take 17 g by mouth daily as needed.   rOPINIRole (REQUIP) 0.25 MG tablet Take 0.25 mg by mouth at bedtime.   [DISCONTINUED] apixaban (ELIQUIS) 5 MG TABS tablet Take 5 mg by mouth 2 (two) times daily.   No facility-administered encounter medications on file as of 05/26/2022.     SIGNIFICANT DIAGNOSTIC EXAMS  PREVIOUS   05-12-21: right lower extremity venous doppler: preliminary report: clot from groin to foot.   05-12-21: right lower extremity venous doppler (From ED):   1. Extensive acute mostly occlusive deep venous thrombosis extending from the right common femoral vein into the calf veins.  03-01-22: right lower extremity venous doppler: no evidence of dvt within visualized vein of right lower extremity   LABS REVIEWED PREVIOUS:    05-12-21: wbc 10.6; hgb 15.2; hct 47.9; mcv 95.0 plt 181; glucose 233; bun 49; creat 1.98; k+ 4.4; na++ 137; ca 8.2; GFR34 05-14-21: glucose 116; bun 32; creat 1.68; k+ 4.1; na++ 139; ca 8.2; GFR 42 05-15-21: wbc 7.5; hgb 14.7; hct 44.6; mcv 89.7 plt 182  05-21-21: wbc 8.6; hgb 13.6; hct 42.9; mcv 93.5 plt 209; glucose 147; bun 35; creat 1.75; k+ 4.0 na++ 8.1; GFR 40 07-23-21: wbc 7.4; hgb 15.8; hct 50.3; mcv 94.7 plt 189; urine culture: enterococcus faecalis/providencia stuartii  08-09-21: wbc 8.3; hgb 14.5; hct 45.3; mcv 93.8 plt 192; glucose 127; bun 34; creat 1.83; k+ 4.1; na++ 140; ca 7.8; GFR 38; alk phos 130; albumin 2.6 urine culture: pseudomonas aeruginosa: cipro 09-09-21: hgb a1c 6.5; chol 95; ldl 58; trig 75; hdl 22; PSA 0.37 09-13-21: wbc 7.4; hgb 15.3; hct 48.5; mcv 94.0 plt 180; glucose 247; bun 43;  creat 2.11; k+ 4.7; na++ 139; ca 8.3;  GFR 32; d-dimer <0.27; CRP 2.34 09-29-21: wbc 8.0; hgb 14.9; hct 46.9; mcv 94.4 plt 170; glucose 168; bun 50; creat 2.04; k+ 4.5; na++ 134; ca 7.9; GFR 33; urine culture: no growth  12-07-21: urine culture: mixed bacteria 01-03-22: glucose 71; bun 54; creat 2.15; k+ 4.4; na++ 138; ca 8.7; GFR 31; liver normal albumin 3.2; hgb a1c 6.2  01-10-22: glucose 89; bun 49; creat 1.90; k+ 4.2; na++ 141; ca 8.7; GFR 36 03-13-22: wbc 8.0; hgb 16.0; hct 51.2; mcv 993.6 plt 148; glucose 156; bun 47; creat 2.50; k+ 4.7; na++ 140; ca 8.7; GFR 26 CRP 6.4; d-dimer 0.41  NO NEW LABS.   Review of Systems  Reason unable to perform ROS: aphasia.   Physical Exam Constitutional:      General: He is not in acute distress.    Appearance: He is well-developed. He is not diaphoretic.  Neck:     Thyroid: No thyromegaly.  Cardiovascular:     Rate and Rhythm: Normal rate and regular rhythm.     Pulses: Normal pulses.     Heart sounds: Normal heart sounds.  Pulmonary:     Effort: Pulmonary effort is normal. No respiratory distress.     Breath sounds: Normal breath sounds.  Abdominal:     General: Bowel sounds are normal. There is no distension.     Palpations: Abdomen is soft.     Tenderness: There is no abdominal tenderness.  Genitourinary:    Comments: foley Musculoskeletal:     Cervical back: Neck supple.     Right lower leg: No edema.     Left lower leg: No edema.     Comments: Right hemiparesis   Lymphadenopathy:     Cervical: No cervical adenopathy.  Skin:    General: Skin is warm and dry.  Neurological:     Mental Status: He is alert. Mental status is at baseline.  Psychiatric:        Mood and Affect: Mood normal.     ASSESSMENT/ PLAN:  TODAY:   Chronic urine retention/bladder spasms: has long term foley; will continue ditropan xl 15 mg daily for spasms  2. Type 2 diabetes mellitus with peripheral vascular disease: is stable hgb 6.7; will continue lantus  20 units nightly humalog 10 units twice daily ; is on statin arb   3. Protein calorie malnutrition severe: weight is 172; albumin 2.2 will continue supplements as directed   4. Nontraumatic subcortical hemorrhage left cerebral hemisphere/ right spastic hemiplegia/ stable will continue baclofen 5 mg three times  daily for spasticity   PREVIOUS   5. Aortic atherosclerosis (ct 01-04-19)  is on statin  will monitor   6. History of DVT: has completed eliquis therapy   7. CAD native heart without angina: is status post CABG 2012. Will continue cozaar 25 mg daily   8. Hypertension associated with type 2 diabetes mellitus: is stable b/p 129/68 will continue norvasc 5 mg daily cozaar 50 mg daily  9. Dyslipidemia associated with type 2 diabetes mellitus; is stable ldl 77 will continue lipitor 40 mgm daily   10. Micro-albuminuria due to type 2 diabetes mellitus: is without change 1299.7 is on ARB  11. Major depression with psychotic features: is stable and presently off medications   12. CKD stage 3 due to type 2 diabetes mellitus: is stable bun 49; creat 1.94 GFR 36     Ok Edwards NP [facility]  call [phone]

## 2022-06-06 DIAGNOSIS — B351 Tinea unguium: Secondary | ICD-10-CM | POA: Diagnosis not present

## 2022-06-06 DIAGNOSIS — M2041 Other hammer toe(s) (acquired), right foot: Secondary | ICD-10-CM | POA: Diagnosis not present

## 2022-06-06 DIAGNOSIS — E1151 Type 2 diabetes mellitus with diabetic peripheral angiopathy without gangrene: Secondary | ICD-10-CM | POA: Diagnosis not present

## 2022-06-06 DIAGNOSIS — Z794 Long term (current) use of insulin: Secondary | ICD-10-CM | POA: Diagnosis not present

## 2022-06-07 ENCOUNTER — Ambulatory Visit (INDEPENDENT_AMBULATORY_CARE_PROVIDER_SITE_OTHER): Payer: Medicare PPO | Admitting: Urology

## 2022-06-07 ENCOUNTER — Encounter: Payer: Self-pay | Admitting: Urology

## 2022-06-07 DIAGNOSIS — R339 Retention of urine, unspecified: Secondary | ICD-10-CM

## 2022-06-07 DIAGNOSIS — N401 Enlarged prostate with lower urinary tract symptoms: Secondary | ICD-10-CM | POA: Diagnosis not present

## 2022-06-07 DIAGNOSIS — N3289 Other specified disorders of bladder: Secondary | ICD-10-CM

## 2022-06-09 ENCOUNTER — Ambulatory Visit (INDEPENDENT_AMBULATORY_CARE_PROVIDER_SITE_OTHER): Payer: Medicare PPO | Admitting: Physician Assistant

## 2022-06-09 VITALS — BP 118/76 | HR 66

## 2022-06-09 DIAGNOSIS — N39498 Other specified urinary incontinence: Secondary | ICD-10-CM | POA: Diagnosis not present

## 2022-06-09 DIAGNOSIS — R339 Retention of urine, unspecified: Secondary | ICD-10-CM

## 2022-06-09 NOTE — Progress Notes (Signed)
Assessment: 1. Urinary retention - BLADDER SCAN AMB NON-IMAGING    Plan: The patient will continue oxybutynin with daily PVRs.  Orders given to the nursing facility to check PVRs if patient unable to void or has complaints of urinary urgency or abdominal pain.  Follow-up in 6 months for recheck with Dr. Diona Fanti as previously scheduled.  Chief Complaint: No chief complaint on file.   HPI: [name] is a 76 y.o. male who presents for continued evaluation of retention and incontinence since removal of his catheter 2 days ago. He remains on oxybutynin daily and is extremely pleased with his urinary status since removal of the catheter.  He is adamant that he does not want to have this replaced again.  Skilled facility records indicate PVRs have been 10 mL to 100 mL with 4 times daily checks.  No gross hematuria, dysuria, urinary burning.  No complaints of abdominal pain.  06/07/22 Work in to see me for difficult catheter placement.  The patient has significant neurologic issues.  He has had a long-term indwelling catheter.  He has traumatic hypospadias as a result.  His catheter has had significant leakage around it.  This has been resistant to any overactive bladder/bladder spasm medication.  Catheter replacement was met with significant yelling by the patient.  Attempt by me to place a coud catheter was for successful I thought but irrigation came out around the catheter.   Portions of the above documentation were copied from a prior visit for review purposes only.  Allergies: No Known Allergies  PMH: Past Medical History:  Diagnosis Date   A-fib ([place])    a. Post-op afib after CABG 08/2012.   Acute gastric ulcer with hemorrhage    Acute respiratory failure with hypoxia (HCC)    Aphasia following cerebral infarction    CAD (coronary artery disease)    a. NSTEMI s/p stent to distal RCA 03/2000. b. Inf MI s/p emergent thrombectomy/stenting mid RCA 10/2000. c. NSTEMI s/p CABGx4  (LIMA-LAD, SVG-OM2, seq SVG-acute marginal and PD) 3/0/13 - course complicated by confusion, post-op AF, L pleural effusion with thoracentesis. d. Normal LV function by echo 08/2012.   Diverticulosis    DM with CKD    Dysphagia following cerebral infarction    Encephalopathy    Extended spectrum beta lactamase (ESBL) resistance    Generalized anxiety disorder    GERD (gastroesophageal reflux disease)    Hemiplegia and hemiparesis following cerebral infarction affecting right dominant side (HCC)    Hiatal hernia    HLD (hyperlipidemia)    HTN (hypertension)    Non-ST elevation (NSTEMI) myocardial infarction [facility])    Nontraumatic intracerebral hemorrhage in hemisphere, subcortical [facility])    Peripheral vascular disease ([place])    a. Carotid dopplers neg 08/13/12. b. Pre-cabg ABIs - R=0.87 suggesting mild dz, L=1.29 possibly falsely elevated due to calcified vessels.   Pleural effusion    a. L pleural eff after CABG s/p thoracentesis 08/22/12.   Prostate cancer [facility])    Status post radiation treatment.   Prostatic hypertrophy    a. Hx of urinary retention, awaiting TURP.   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 Mild  MR by TEE 08/2012.    PSH: Past Surgical History:  Procedure Laterality Date   APPENDECTOMY     BACK SURGERY     CORONARY ARTERY  BYPASS GRAFT  08/16/2012   Procedure: CORONARY ARTERY BYPASS GRAFTING (CABG);  Surgeon: Melrose Nakayama, MD;  Location: [place];  Service: Open Heart Surgery;  Laterality: N/A;   IR REPLC GASTRO/COLONIC TUBE PERCUT W/FLUORO  02/13/2019   LEFT HEART CATHETERIZATION WITH CORONARY ANGIOGRAM N/A 08/14/2012   Procedure: LEFT HEART CATHETERIZATION WITH CORONARY ANGIOGRAM;  Surgeon: Peter M Martinique, MD;  Location: [facility] CATH LAB;  Service: Cardiovascular;  Laterality: N/A;    SH: Social History   Tobacco Use   Smoking status: Former     Types: Cigarettes    Quit date: 11/12/1972    Years since quitting: 49.6   Smokeless tobacco: Never  Vaping Use   Vaping Use: Never used  Substance Use Topics   Alcohol use: No   Drug use: No    ROS: All other review of systems were reviewed and are negative except what is noted above in HPI  PE: BP 118/76   Pulse 66  GENERAL APPEARANCE: Alert, resting comfortably in wheelchair in no apparent distress. HEENT:  Atraumatic, normocephalic NECK:  Supple.  ABDOMEN:  Soft, non-tender, no masses NEUROLOGIC:  Alert and oriented x 3, wheelchair mobilization in reclining position MENTAL STATUS:  appropriate SKIN:  Warm, dry, and intact   Results: Laboratory Data: Lab Results  Component Value Date   WBC 8.0 03/13/2022   HGB 16.0 03/13/2022   HCT 51.2 03/13/2022   MCV 93.6 03/13/2022   PLT 148 (L) 03/13/2022    Lab Results  Component Value Date   CREATININE 2.50 (H) 03/13/2022    No results found for: "PSA"  No results found for: "TESTOSTERONE"  Lab Results  Component Value Date   HGBA1C 6.2 (H) 01/03/2022    Urinalysis    Component Value Date/Time   COLORURINE YELLOW 12/07/2021 1503   APPEARANCEUR HAZY (A) 12/07/2021 1503   LABSPEC 1.025 12/07/2021 1503   PHURINE 5.5 12/07/2021 1503   GLUCOSEU NEGATIVE 12/07/2021 1503   HGBUR MODERATE (A) 12/07/2021 1503   BILIRUBINUR NEGATIVE 12/07/2021 1503   KETONESUR NEGATIVE 12/07/2021 1503   PROTEINUR 100 (A) 12/07/2021 1503   UROBILINOGEN 1.0 08/23/2012 1732   NITRITE POSITIVE (A) 12/07/2021 1503   LEUKOCYTESUR LARGE (A) 12/07/2021 1503    Lab Results  Component Value Date   BACTERIA MANY (A) 12/07/2021    Pertinent Imaging: Results for orders placed during the hospital encounter of 08/10/21  DG Abd 1 View  Narrative CLINICAL DATA:  Constipation  EXAM: ABDOMEN - 1 VIEW  COMPARISON:  02/12/2019, 02/11/2019  FINDINGS: Chronic elevation of left diaphragm with atelectasis. Surgical clips in the right  upper quadrant. Large amount of stool in the colon. Mild increased small bowel gas without definitive obstructive pattern.  IMPRESSION: Overall nonobstructed gas pattern with large amount of stool in the colon.   Electronically Signed By: Donavan Foil M.D. On: 08/17/2021 15:14  No results found for this or any previous visit.  No results found for this or any previous visit.  No results found for this or any previous visit.  No results found for this or any previous visit.  No results found for this or any previous visit.  No results found for this or any previous visit.  No results found for this or any previous visit.  No results found for this or any previous visit (from the past 24 hour(s)).

## 2022-06-16 ENCOUNTER — Non-Acute Institutional Stay (SKILLED_NURSING_FACILITY): Payer: Medicare PPO | Admitting: Internal Medicine

## 2022-06-16 ENCOUNTER — Encounter: Payer: Self-pay | Admitting: Internal Medicine

## 2022-06-16 DIAGNOSIS — R339 Retention of urine, unspecified: Secondary | ICD-10-CM | POA: Diagnosis not present

## 2022-06-16 DIAGNOSIS — E1159 Type 2 diabetes mellitus with other circulatory complications: Secondary | ICD-10-CM | POA: Diagnosis not present

## 2022-06-16 DIAGNOSIS — N183 Chronic kidney disease, stage 3 unspecified: Secondary | ICD-10-CM

## 2022-06-16 DIAGNOSIS — E1151 Type 2 diabetes mellitus with diabetic peripheral angiopathy without gangrene: Secondary | ICD-10-CM

## 2022-06-16 DIAGNOSIS — E1122 Type 2 diabetes mellitus with diabetic chronic kidney disease: Secondary | ICD-10-CM | POA: Diagnosis not present

## 2022-06-16 DIAGNOSIS — I152 Hypertension secondary to endocrine disorders: Secondary | ICD-10-CM | POA: Diagnosis not present

## 2022-06-16 NOTE — Progress Notes (Unsigned)
NURSING HOME LOCATION:  [facility] ROOM NUMBER:  130 P  CODE STATUS:  DNR  PCP:  Ok Edwards NP,PSC  This is a nursing facility follow up visit of chronic medical diagnoses & to document compliance with Regulation 483.30 (c) in The [place] Manual Phase 2 which mandates caregiver visit ( visits can alternate among physician, PA or NP as per statutes) within 10 days of 30 days / 60 days/ 90 days post admission to SNF date    Interim medical record and care since last SNF visit was updated with review of diagnostic studies and change in clinical status since last visit were documented.  HPI: He is a permanent resident of facility with medical diagnoses of diabetes with peripheral vascular disease, essential hypertension, CAD, PAF, dyslipidemia, CKD stage III, history of hemorrhagic CVA with right spastic hemiplegia and expressive aphasia, history of DVT, and protein/caloric malnutrition. Most recent A1c was 6.2% on 01/03/2022.  On 4/2 creatinine was 2.50 with a GFR 26; prior value was 1.90-2.15.  H/H are high normal with values of 16/51.2.  Platelet count was minimally reduced at 148,000.  Review of systems: He was much more alert and focused than when previously seen.  He could not remember the urologist name but was aware of the Foley has been removed and he adamantly stated it would "never again" be reinserted because of the pain.  He denied any other active symptoms.  Constitutional: No fever, significant weight change, fatigue  Eyes: No redness, discharge, pain, vision change ENT/mouth: No nasal congestion,  purulent discharge, earache, change in hearing, sore throat  Cardiovascular: No chest pain, palpitations, paroxysmal nocturnal dyspnea, claudication, edema  Respiratory: No cough, sputum production, hemoptysis, DOE, significant snoring, apnea   Gastrointestinal: No heartburn, dysphagia, abdominal pain, nausea /vomiting, rectal bleeding, melena, change  in bowels Genitourinary: No dysuria, hematuria, pyuria, incontinence, nocturia Musculoskeletal: No joint stiffness, joint swelling, weakness, pain Dermatologic: No rash, pruritus, change in appearance of skin Neurologic: No dizziness, headache, syncope, seizures, numbness, tingling Psychiatric: No significant anxiety, depression, insomnia, anorexia Endocrine: No change in hair/skin/nails, excessive thirst, excessive hunger, excessive urination  Hematologic/lymphatic: No significant bruising, lymphadenopathy, abnormal bleeding Allergy/immunology: No itchy/watery eyes, significant sneezing, urticaria, angioedema  Physical exam:  Pertinent or positive findings: Initially when entered the room he was staring blankly at the TV but then turned and engaged me.  As noted he was focused rather than distant as has been the case in the past.  Eyebrows are decreased laterally.  Teeth are immaculate.  Heart rhythm is slightly irregular.  First and second heart sounds are slightly increased.  Breath sounds are decreased.  He has 1/2+ edema at the sock line.  Pedal pulses are surprisingly good.  Interosseous wasting of the hands is noted.  The right upper and lower extremities are weaker than the left with the greatest weakness in the right upper extremity. General appearance: Adequately nourished; no acute distress, increased work of breathing is present.   Lymphatic: No lymphadenopathy about the head, neck, axilla. Eyes: No conjunctival inflammation or lid edema is present. There is no scleral icterus. Ears:  External ear exam shows no significant lesions or deformities.   Nose:  External nasal examination shows no deformity or inflammation. Nasal mucosa are pink and moist without lesions, exudates Oral exam:  Lips and gums are healthy appearing. There is no oropharyngeal erythema or exudate. Neck:  No thyromegaly, masses, tenderness noted.    Heart:  Normal rate and  regular rhythm. S1 and S2 normal without  gallop, murmur, click, rub .  Lungs: Chest clear to auscultation without wheezes, rhonchi, rales, rubs. Abdomen: Bowel sounds are normal. Abdomen is soft and nontender with no organomegaly, hernias, masses. GU: Deferred  Extremities:  No cyanosis, clubbing, edema  Neurologic exam : Cn 2-7 intact Strength equal  in upper & lower extremities Balance, Rhomberg, finger to nose testing could not be completed due to clinical state Deep tendon reflexes are equal Skin: Warm & dry w/o tenting. No significant lesions or rash.  See summary under each active problem in the Problem List with associated updated therapeutic plan

## 2022-06-17 NOTE — Patient Instructions (Signed)
See assessment and plan under each diagnosis in the problem list and acutely for this visit 

## 2022-06-17 NOTE — Assessment & Plan Note (Signed)
He has a history of urinary retention; the Foley catheter has been removed. He adamantly states it will not be reinserted.  He is incontinent but clinically there is no urinary retention.   03/13/22 creat 2.50 & GFR 26, CKD Stage 4.ARB is low dose. Update of renal function indicated.

## 2022-06-17 NOTE — Assessment & Plan Note (Addendum)
Urology removed the Foley; he remains incontinent without clinical urinary retention.  He adamantly declares he will not have the Foley replaced. Epic lists Ditropan which is contraindicated with urinary retention. Discuss with NP.

## 2022-06-17 NOTE — Assessment & Plan Note (Signed)
BP controlled; no change in antihypertensive medications  

## 2022-06-17 NOTE — Assessment & Plan Note (Signed)
01/03/2022 A1c 6.2% indicating excellent control.  No hypoglycemia reported; A1c update indicated.

## 2022-06-27 ENCOUNTER — Other Ambulatory Visit (HOSPITAL_COMMUNITY)
Admission: RE | Admit: 2022-06-27 | Discharge: 2022-06-27 | Disposition: A | Discharge status: Home / Self Care | Payer: Medicare PPO | Source: Skilled Nursing Facility | Attending: Adult Health | Admitting: Adult Health

## 2022-06-27 DIAGNOSIS — E1165 Type 2 diabetes mellitus with hyperglycemia: Secondary | ICD-10-CM | POA: Insufficient documentation

## 2022-06-27 LAB — LIPID PANEL
Cholesterol: 103 mg/dL (ref 0–200)
HDL: 23 mg/dL — ABNORMAL LOW (ref >40)
LDL Cholesterol: 65 mg/dL (ref 0–99)
Total CHOL/HDL Ratio: 4.5 RATIO
Triglycerides: 76 mg/dL (ref <150)
VLDL: 15 mg/dL (ref 0–40)

## 2022-06-27 LAB — HEMOGLOBIN A1C
Hgb A1c MFr Bld: 6.5 % — ABNORMAL HIGH (ref 4.8–5.6)
Mean Plasma Glucose: 139.85 mg/dL

## 2022-06-27 LAB — CBC
HCT: 47.6 % (ref 39.0–52.0)
Hemoglobin: 15.4 g/dL (ref 13.0–17.0)
MCH: 29.8 pg (ref 26.0–34.0)
MCHC: 32.4 g/dL (ref 30.0–36.0)
MCV: 92.2 fL (ref 80.0–100.0)
Platelets: 159 10*3/uL (ref 150–400)
RBC: 5.16 MIL/uL (ref 4.22–5.81)
RDW: 13.2 % (ref 11.5–15.5)
WBC: 7.7 10*3/uL (ref 4.0–10.5)
nRBC: 0 % (ref 0.0–0.2)

## 2022-06-27 LAB — COMPREHENSIVE METABOLIC PANEL
ALT: 27 U/L (ref 0–44)
AST: 24 U/L (ref 15–41)
Albumin: 2.9 g/dL — ABNORMAL LOW (ref 3.5–5.0)
Alkaline Phosphatase: 106 U/L (ref 38–126)
Anion gap: 5 (ref 5–15)
BUN: 38 mg/dL — ABNORMAL HIGH (ref 8–23)
CO2: 29 mmol/L (ref 22–32)
Calcium: 8.7 mg/dL — ABNORMAL LOW (ref 8.9–10.3)
Chloride: 108 mmol/L (ref 98–111)
Creatinine, Ser: 2.13 mg/dL — ABNORMAL HIGH (ref 0.61–1.24)
GFR, Estimated: 31 mL/min — ABNORMAL LOW (ref >60)
Glucose, Bld: 151 mg/dL — ABNORMAL HIGH (ref 70–99)
Potassium: 4.1 mmol/L (ref 3.5–5.1)
Sodium: 142 mmol/L (ref 135–145)
Total Bilirubin: 0.9 mg/dL (ref 0.3–1.2)
Total Protein: 6.1 g/dL — ABNORMAL LOW (ref 6.5–8.1)

## 2022-06-30 DIAGNOSIS — H25811 Combined forms of age-related cataract, right eye: Secondary | ICD-10-CM | POA: Diagnosis not present

## 2022-07-05 DIAGNOSIS — F5105 Insomnia due to other mental disorder: Secondary | ICD-10-CM | POA: Diagnosis not present

## 2022-07-05 DIAGNOSIS — F333 Major depressive disorder, recurrent, severe with psychotic symptoms: Secondary | ICD-10-CM | POA: Diagnosis not present

## 2022-07-15 ENCOUNTER — Encounter: Payer: Self-pay | Admitting: Adult Health

## 2022-07-15 ENCOUNTER — Non-Acute Institutional Stay (SKILLED_NURSING_FACILITY): Payer: Medicare PPO | Admitting: Adult Health

## 2022-07-15 DIAGNOSIS — E1122 Type 2 diabetes mellitus with diabetic chronic kidney disease: Secondary | ICD-10-CM

## 2022-07-15 DIAGNOSIS — I152 Hypertension secondary to endocrine disorders: Secondary | ICD-10-CM | POA: Diagnosis not present

## 2022-07-15 DIAGNOSIS — I700 Atherosclerosis of aorta: Secondary | ICD-10-CM

## 2022-07-15 DIAGNOSIS — E1159 Type 2 diabetes mellitus with other circulatory complications: Secondary | ICD-10-CM | POA: Diagnosis not present

## 2022-07-15 DIAGNOSIS — E1151 Type 2 diabetes mellitus with diabetic peripheral angiopathy without gangrene: Secondary | ICD-10-CM | POA: Diagnosis not present

## 2022-07-15 DIAGNOSIS — N183 Chronic kidney disease, stage 3 unspecified: Secondary | ICD-10-CM | POA: Diagnosis not present

## 2022-07-15 NOTE — Progress Notes (Signed)
Location:  [place] Room Number: 130 Place of Service:  SNF (31)   CODE STATUS: DNR  No Known Allergies  Chief Complaint  Patient presents with   Acute Visit    Care plan meeting.     HPI:  We have come together for her care plan meeting. Family present . BIMS 5/15; mood 0/30. He is nonambulatory with no falls. He requires extensive assist to dependent with his adls. He is incontinent of bladder and bowel. Dietary:  weight is 170.4 pounds regular diet 50-100% appetite; feeds self.  Therapy: sitting balance; sitting up in wheelchair will stop next week.  Activities: spends most of time in room with his wife. He continues to be followed for his chronic illnesses including:  Aortic atherosclerosis Hypertension associated with diabetes  Type 2 diabetes mellitus with peripheral vascular disease  CKD stage 3 due to type 2 diabetes mellitus. We have discussed his advanced directives MOST form filled out.    Past Medical History:  Diagnosis Date   A-fib [facility])    a. Post-op afib after CABG 08/2012.   Acute gastric ulcer with hemorrhage    Acute respiratory failure with hypoxia (HCC)    Aphasia following cerebral infarction    CAD (coronary artery disease)    a. NSTEMI s/p stent to distal RCA 03/2000. b. Inf MI s/p emergent thrombectomy/stenting mid RCA 10/2000. c. NSTEMI s/p CABGx4 (LIMA-LAD, SVG-OM2, seq SVG-acute marginal and PD) 12/17/03 - course complicated by confusion, post-op AF, L pleural effusion with thoracentesis. d. Normal LV function by echo 08/2012.   Diverticulosis    DM with CKD    Dysphagia following cerebral infarction    Encephalopathy    Extended spectrum beta lactamase (ESBL) resistance    Generalized anxiety disorder    GERD (gastroesophageal reflux disease)    Hemiplegia and hemiparesis following cerebral infarction affecting right dominant side (HCC)    Hiatal hernia    HLD (hyperlipidemia)    HTN (hypertension)    Non-ST elevation  (NSTEMI) myocardial infarction [facility])    Nontraumatic intracerebral hemorrhage in hemisphere, subcortical [facility])    Peripheral vascular disease ([place])    a. Carotid dopplers neg 08/13/12. b. Pre-cabg ABIs - R=0.87 suggesting mild dz, L=1.29 possibly falsely elevated due to calcified vessels.   Pleural effusion    a. L pleural eff after CABG s/p thoracentesis 08/22/12.   Prostate cancer [facility])    Status post radiation treatment.   Prostatic hypertrophy    a. Hx of urinary retention, awaiting TURP.   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 Mild  MR by TEE 08/2012.    Past Surgical History:  Procedure Laterality Date   APPENDECTOMY     BACK SURGERY     CORONARY ARTERY BYPASS GRAFT  08/16/2012   Procedure: CORONARY ARTERY BYPASS GRAFTING (CABG);  Surgeon: Melrose Nakayama, MD;  Location: [place];  Service: Open Heart Surgery;  Laterality: N/A;   IR REPLC GASTRO/COLONIC TUBE PERCUT W/FLUORO  02/13/2019   LEFT HEART CATHETERIZATION WITH CORONARY ANGIOGRAM N/A 08/14/2012   Procedure: LEFT HEART CATHETERIZATION WITH CORONARY ANGIOGRAM;  Surgeon: Peter M Martinique, MD;  Location: [facility] CATH LAB;  Service: Cardiovascular;  Laterality: N/A;    Social History   Socioeconomic History   Marital status: Married    Spouse name: Not on file  Number of children: Not on file   Years of education: Not on file   Highest education level: Not on file  Occupational History   Occupation: retired   Tobacco Use   Smoking status: Former    Types: Cigarettes    Quit date: 11/12/1972    Years since quitting: 49.7   Smokeless tobacco: Never  Vaping Use   Vaping Use: Never used  Substance and Sexual Activity   Alcohol use: No   Drug use: No   Sexual activity: Not Currently  Other Topics Concern   Not on file  Social History Narrative   He is a long term patient of [facility]    Social  Determinants of Health   Financial Resource Strain: Low Risk  (07/01/2019)   Overall Financial Resource Strain (CARDIA)    Difficulty of Paying Living Expenses: Not hard at all  Food Insecurity: Unknown (07/01/2019)   Hunger Vital Sign    Worried About [place] in the Last Year: Patient refused    [place] in the Last Year: Patient refused  Transportation Needs: Unknown (07/01/2019)   PRAPARE - Transportation    Lack of Transportation (Medical): Patient refused    Lack of Transportation (Non-Medical): Patient refused  Physical Activity: Inactive (10/30/2019)   Exercise Vital Sign    Days of Exercise per Week: 0 days    Minutes of Exercise per Session: 0 min  Stress: Unknown (07/01/2019)   [place] of Stress : Patient refused  Social Connections: Unknown (07/01/2019)   Social Connection and Isolation Panel [NHANES]    Frequency of Communication with Friends and Family: Patient refused    Frequency of Social Gatherings with Friends and Family: Patient refused    Attends Religious Services: Patient refused    Active Member of Clubs or Organizations: Patient refused    Attends Archivist Meetings: Patient refused    Marital Status: Patient refused  Intimate Partner Violence: Unknown (07/01/2019)   Humiliation, Afraid, Rape, and Kick questionnaire    Fear of Current or Ex-Partner: Patient refused    Emotionally Abused: Patient refused    Physically Abused: Patient refused    Sexually Abused: Patient refused   Family History  Problem Relation Age of Onset   Hypertension Mother       VITAL SIGNS BP 122/75   Pulse 72   Temp 97.6 F (36.4 C)   Resp 20   Ht _0  (1.803 m)   Wt 174 lb (78.9 kg)   SpO2 94%   BMI 24.27 kg/m   Outpatient Encounter Medications as of 07/15/2022  Medication Sig   acetaminophen (TYLENOL) 325 MG tablet Take 650 mg by mouth every 4 (four) hours as  needed. per standing order for pain/fever DO NOT EXCEED >3,000 mg in 24 hours   amLODipine (NORVASC) 5 MG tablet Take 5 mg by mouth daily.    atorvastatin (LIPITOR) 40 MG tablet Take 40 mg by mouth daily.   Baclofen 5 MG TABS Take 5 mg by mouth 3 (three) times daily. Nontraumatic intracerebral hemorrhage in hemisphere, subcortical   Balsam [place]-Castor Oil (VENELEX) OINT Apply topically. Special Instructions: Apply to sacrum, coccyx and bilateral buttocks qshift for prevention/ redness. Every Shift Day, Evening, Night   BD AUTOSHIELD DUO 30G X 5 MM MISC 3/16"   feeding supplement, GLUCERNA SHAKE, (GLUCERNA SHAKE) LIQD Take by mouth daily.   insulin glargine (LANTUS) 100 UNIT/ML  injection Inject 20 Units into the skin daily.   insulin lispro (HUMALOG) 100 UNIT/ML cartridge Inject 5 Units into the skin in the morning and at bedtime. Give 5 units  with breakfast and lunch   lansoprazole (PREVACID SOLUTAB) 30 MG disintegrating tablet Take 30 mg by mouth daily. Give before meals   loratadine (CLARITIN) 10 MG tablet Take 10 mg by mouth daily.   losartan (COZAAR) 25 MG tablet Take 25 mg by mouth daily. DX: Hypertensive heart and chronic kidney disease without heart failure, with stage 1 through stage 4 chronic kidney disease, or unspecified chronic kidney disease-per NP note, code updated   Melatonin 1 MG TABS Take 2 tablets by mouth at bedtime.    NON FORMULARY Diet Change: Regular, thin liquids   ondansetron (ZOFRAN-ODT) 4 MG disintegrating tablet Take 4 mg by mouth every 6 (six) hours as needed for nausea or vomiting.   rOPINIRole (REQUIP) 0.25 MG tablet Take 0.25 mg by mouth at bedtime.   No facility-administered encounter medications on file as of 07/15/2022.     SIGNIFICANT DIAGNOSTIC EXAMS  PREVIOUS   03-01-22: right lower extremity venous doppler: no evidence of dvt within visualized vein of right lower extremity   LABS REVIEWED PREVIOUS:    07-23-21: wbc 7.4; hgb 15.8; hct 50.3; mcv 94.7  plt 189; urine culture: enterococcus faecalis/providencia stuartii  08-09-21: wbc 8.3; hgb 14.5; hct 45.3; mcv 93.8 plt 192; glucose 127; bun 34; creat 1.83; k+ 4.1; na++ 140; ca 7.8; GFR 38; alk phos 130; albumin 2.6 urine culture: pseudomonas aeruginosa: cipro 09-09-21: hgb a1c 6.5; chol 95; ldl 58; trig 75; hdl 22; PSA 0.37 09-13-21: wbc 7.4; hgb 15.3; hct 48.5; mcv 94.0 plt 180; glucose 247; bun 43; creat 2.11; k+ 4.7; na++ 139; ca 8.3; GFR 32; d-dimer <0.27; CRP 2.34 09-29-21: wbc 8.0; hgb 14.9; hct 46.9; mcv 94.4 plt 170; glucose 168; bun 50; creat 2.04; k+ 4.5; na++ 134; ca 7.9; GFR 33; urine culture: no growth  12-07-21: urine culture: mixed bacteria 01-03-22: glucose 71; bun 54; creat 2.15; k+ 4.4; na++ 138; ca 8.7; GFR 31; liver normal albumin 3.2; hgb a1c 6.2  01-10-22: glucose 89; bun 49; creat 1.90; k+ 4.2; na++ 141; ca 8.7; GFR 36 03-13-22: wbc 8.0; hgb 16.0; hct 51.2; mcv 993.6 plt 148; glucose 156; bun 47; creat 2.50; k+ 4.7; na++ 140; ca 8.7; GFR 26 CRP 6.4; d-dimer 0.41  NO NEW LABS.   Review of Systems  Reason unable to perform ROS: aphasia.    Physical Exam Constitutional:      General: He is not in acute distress.    Appearance: He is well-developed. He is not diaphoretic.  Neck:     Thyroid: No thyromegaly.  Cardiovascular:     Rate and Rhythm: Normal rate and regular rhythm.     Pulses: Normal pulses.     Heart sounds: Normal heart sounds.  Pulmonary:     Effort: Pulmonary effort is normal. No respiratory distress.     Breath sounds: Normal breath sounds.  Abdominal:     General: Bowel sounds are normal. There is no distension.     Palpations: Abdomen is soft.     Tenderness: There is no abdominal tenderness.  Musculoskeletal:     Cervical back: Neck supple.     Right lower leg: No edema.     Left lower leg: No edema.     Comments: Right hemiparesis   Lymphadenopathy:     Cervical: No cervical adenopathy.  Skin:    General: Skin is warm and dry.   Neurological:     Mental Status: He is alert. Mental status is at baseline.  Psychiatric:        Mood and Affect: Mood normal.       ASSESSMENT/ PLAN:  TODAY  Aortic atherosclerosis Hypertension associated with diabetes Type 2 diabetes mellitus with peripheral vascular disease CKD stage 3 due to type 2 diabetes mellitus  Will continue current medications Will continue current plan of care Will continue to monitor his status    Time spent with patient 40 minutes: (20 minutes spent with advanced directives MOST form filled out)  care plan; medications; dietary    Ok Edwards NP [facility] Adult Medicine   call [phone]

## 2022-07-18 ENCOUNTER — Non-Acute Institutional Stay (SKILLED_NURSING_FACILITY): Payer: Medicare PPO | Admitting: Adult Health

## 2022-07-18 ENCOUNTER — Encounter: Payer: Self-pay | Admitting: Adult Health

## 2022-07-18 DIAGNOSIS — I700 Atherosclerosis of aorta: Secondary | ICD-10-CM | POA: Diagnosis not present

## 2022-07-18 DIAGNOSIS — I2510 Atherosclerotic heart disease of native coronary artery without angina pectoris: Secondary | ICD-10-CM

## 2022-07-18 DIAGNOSIS — I152 Hypertension secondary to endocrine disorders: Secondary | ICD-10-CM | POA: Diagnosis not present

## 2022-07-18 DIAGNOSIS — E1159 Type 2 diabetes mellitus with other circulatory complications: Secondary | ICD-10-CM | POA: Diagnosis not present

## 2022-07-18 DIAGNOSIS — Z86718 Personal history of other venous thrombosis and embolism: Secondary | ICD-10-CM | POA: Diagnosis not present

## 2022-07-18 NOTE — Progress Notes (Signed)
Location:  [place] Room Number: 375-O Place of Service:  SNF (31)   CODE STATUS: DNR  No Known Allergies  Chief Complaint  Patient presents with   Medical Management of Chronic Issues                     Aortic atherosclerosis  History of dvt; CAD native heart without angina; Hypertension associated with type 2 diabetes mellitus    HPI:  He is a 76 year old long term resident of this facility being seen for the management of her chronic illnesses:  Aortic atherosclerosis  History of dvt; CAD native heart without angina; Hypertension associated with type 2 diabetes mellitus. His foley is out; is no longer on ditropan. There are no repots of uncontrolled pain. He does get out of bed daily; does use wheelchair at times.   Past Medical History:  Diagnosis Date   A-fib [facility])    a. Post-op afib after CABG 08/2012.   Acute gastric ulcer with hemorrhage    Acute respiratory failure with hypoxia (HCC)    Aphasia following cerebral infarction    CAD (coronary artery disease)    a. NSTEMI s/p stent to distal RCA 03/2000. b. Inf MI s/p emergent thrombectomy/stenting mid RCA 10/2000. c. NSTEMI s/p CABGx4 (LIMA-LAD, SVG-OM2, seq SVG-acute marginal and PD) 02/14/05 - course complicated by confusion, post-op AF, L pleural effusion with thoracentesis. d. Normal LV function by echo 08/2012.   Diverticulosis    DM with CKD    Dysphagia following cerebral infarction    Encephalopathy    Extended spectrum beta lactamase (ESBL) resistance    Generalized anxiety disorder    GERD (gastroesophageal reflux disease)    Hemiplegia and hemiparesis following cerebral infarction affecting right dominant side (HCC)    Hiatal hernia    HLD (hyperlipidemia)    HTN (hypertension)    Non-ST elevation (NSTEMI) myocardial infarction [facility])    Nontraumatic intracerebral hemorrhage in hemisphere, subcortical [facility])    Peripheral vascular disease ([place])    a. Carotid dopplers neg 08/13/12. b.  Pre-cabg ABIs - R=0.87 suggesting mild dz, L=1.29 possibly falsely elevated due to calcified vessels.   Pleural effusion    a. L pleural eff after CABG s/p thoracentesis 08/22/12.   Prostate cancer [facility])    Status post radiation treatment.   Prostatic hypertrophy    a. Hx of urinary retention, awaiting TURP.   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 Mild  MR by TEE 08/2012.    Past Surgical History:  Procedure Laterality Date   APPENDECTOMY     BACK SURGERY     CORONARY ARTERY BYPASS GRAFT  08/16/2012   Procedure: CORONARY ARTERY BYPASS GRAFTING (CABG);  Surgeon: Melrose Nakayama, MD;  Location: [place];  Service: Open Heart Surgery;  Laterality: N/A;   IR REPLC GASTRO/COLONIC TUBE PERCUT W/FLUORO  02/13/2019   LEFT HEART CATHETERIZATION WITH CORONARY ANGIOGRAM N/A 08/14/2012   Procedure: LEFT HEART CATHETERIZATION WITH CORONARY ANGIOGRAM;  Surgeon: Peter M Martinique, MD;  Location: [facility] CATH LAB;  Service: Cardiovascular;  Laterality: N/A;    Social History   Socioeconomic History   Marital status: Married    Spouse name: Not on file   Number of children: Not on file   Years of education: Not on file   Highest  education level: Not on file  Occupational History   Occupation: retired   Tobacco Use   Smoking status: Former    Types: Cigarettes    Quit date: 11/12/1972    Years since quitting: 49.7   Smokeless tobacco: Never  Vaping Use   Vaping Use: Never used  Substance and Sexual Activity   Alcohol use: No   Drug use: No   Sexual activity: Not Currently  Other Topics Concern   Not on file  Social History Narrative   He is a long term patient of [facility]    Social Determinants of Health   Financial Resource Strain: Low Risk  (07/01/2019)   Overall Financial Resource Strain (CARDIA)    Difficulty of Paying Living Expenses: Not hard at all  Food Insecurity:  Unknown (07/01/2019)   Hunger Vital Sign    Worried About [place] in the Last Year: Patient refused    [place] in the Last Year: Patient refused  Transportation Needs: Unknown (07/01/2019)   PRAPARE - Transportation    Lack of Transportation (Medical): Patient refused    Lack of Transportation (Non-Medical): Patient refused  Physical Activity: Inactive (10/30/2019)   Exercise Vital Sign    Days of Exercise per Week: 0 days    Minutes of Exercise per Session: 0 min  Stress: Unknown (07/01/2019)   [place] of Stress : Patient refused  Social Connections: Unknown (07/01/2019)   Social Connection and Isolation Panel [NHANES]    Frequency of Communication with Friends and Family: Patient refused    Frequency of Social Gatherings with Friends and Family: Patient refused    Attends Religious Services: Patient refused    Active Member of Clubs or Organizations: Patient refused    Attends Archivist Meetings: Patient refused    Marital Status: Patient refused  Intimate Partner Violence: Unknown (07/01/2019)   Humiliation, Afraid, Rape, and Kick questionnaire    Fear of Current or Ex-Partner: Patient refused    Emotionally Abused: Patient refused    Physically Abused: Patient refused    Sexually Abused: Patient refused   Family History  Problem Relation Age of Onset   Hypertension Mother       VITAL SIGNS BP 103/65   Pulse 60   Temp (!) 97.4 F (36.3 C)   Resp 20   Ht 5' 11" (1.803 m)   Wt 174 lb (78.9 kg)   SpO2 94%   BMI 24.27 kg/m   Outpatient Encounter Medications as of 07/18/2022  Medication Sig   acetaminophen (TYLENOL) 325 MG tablet Take 650 mg by mouth every 4 (four) hours as needed. per standing order for pain/fever DO NOT EXCEED >3,000 mg in 24 hours   amLODipine (NORVASC) 5 MG tablet Take 5 mg by mouth daily.    atorvastatin (LIPITOR) 40 MG tablet Take 40 mg by  mouth daily.   Baclofen 5 MG TABS Take 5 mg by mouth 3 (three) times daily. Nontraumatic intracerebral hemorrhage in hemisphere, subcortical   Balsam [place]-Castor Oil (VENELEX) OINT Apply topically. Special Instructions: Apply to sacrum, coccyx and bilateral buttocks qshift for prevention/ redness. Every Shift Day, Evening, Night   BD AUTOSHIELD DUO 30G X 5 MM MISC 3/16"   feeding supplement, GLUCERNA SHAKE, (GLUCERNA SHAKE) LIQD Take by mouth daily.   insulin glargine (LANTUS) 100 UNIT/ML injection Inject 20 Units into the skin daily.   insulin lispro (HUMALOG) 100 UNIT/ML cartridge  Inject 5 Units into the skin in the morning and at bedtime. Give 5 units  with breakfast and lunch   lansoprazole (PREVACID SOLUTAB) 30 MG disintegrating tablet Take 30 mg by mouth daily. Give before meals   loratadine (CLARITIN) 10 MG tablet Take 10 mg by mouth daily.   losartan (COZAAR) 25 MG tablet Take 25 mg by mouth daily. DX: Hypertensive heart and chronic kidney disease without heart failure, with stage 1 through stage 4 chronic kidney disease, or unspecified chronic kidney disease-per NP note, code updated   Melatonin 1 MG TABS Take 2 tablets by mouth at bedtime.    NON FORMULARY Diet Change: Regular, thin liquids   ondansetron (ZOFRAN-ODT) 4 MG disintegrating tablet Take 4 mg by mouth every 6 (six) hours as needed for nausea or vomiting.   rOPINIRole (REQUIP) 0.25 MG tablet Take 0.25 mg by mouth at bedtime.   No facility-administered encounter medications on file as of 07/18/2022.     SIGNIFICANT DIAGNOSTIC EXAMS   PREVIOUS   03-01-22: right lower extremity venous doppler: no evidence of dvt within visualized vein of right lower extremity   LABS REVIEWED PREVIOUS:     07-23-21: wbc 7.4; hgb 15.8; hct 50.3; mcv 94.7 plt 189; urine culture: enterococcus faecalis/providencia stuartii  08-09-21: wbc 8.3; hgb 14.5; hct 45.3; mcv 93.8 plt 192; glucose 127; bun 34; creat 1.83; k+ 4.1; na++ 140; ca 7.8; GFR  38; alk phos 130; albumin 2.6 urine culture: pseudomonas aeruginosa: cipro 09-09-21: hgb a1c 6.5; chol 95; ldl 58; trig 75; hdl 22; PSA 0.37 09-13-21: wbc 7.4; hgb 15.3; hct 48.5; mcv 94.0 plt 180; glucose 247; bun 43; creat 2.11; k+ 4.7; na++ 139; ca 8.3; GFR 32; d-dimer <0.27; CRP 2.34 09-29-21: wbc 8.0; hgb 14.9; hct 46.9; mcv 94.4 plt 170; glucose 168; bun 50; creat 2.04; k+ 4.5; na++ 134; ca 7.9; GFR 33; urine culture: no growth  12-07-21: urine culture: mixed bacteria 01-03-22: glucose 71; bun 54; creat 2.15; k+ 4.4; na++ 138; ca 8.7; GFR 31; liver normal albumin 3.2; hgb a1c 6.2  01-10-22: glucose 89; bun 49; creat 1.90; k+ 4.2; na++ 141; ca 8.7; GFR 36 03-13-22: wbc 8.0; hgb 16.0; hct 51.2; mcv 993.6 plt 148; glucose 156; bun 47; creat 2.50; k+ 4.7; na++ 140; ca 8.7; GFR 26 CRP 6.4; d-dimer 0.41  TODAY  03-21-22: crp: <0.5 06-27-22: wbc 7.7; hgb 15.4; hct 47.6; mcv 92.2 plt 159; glucose 151; bun 38; creat 2.13; k+ 4.1; na++ 142; ca 8.7; gfr 31; protein 6.1; albumin 2.9; hgb a1c 6.5; chol 103; ldl 65; trig 76; hdl 23    Review of Systems  Reason unable to perform ROS: aphasia.   Physical Exam Constitutional:      General: He is not in acute distress.    Appearance: He is well-developed. He is not diaphoretic.  Neck:     Thyroid: No thyromegaly.  Cardiovascular:     Rate and Rhythm: Normal rate and regular rhythm.     Pulses: Normal pulses.     Heart sounds: Normal heart sounds.  Pulmonary:     Effort: Pulmonary effort is normal. No respiratory distress.     Breath sounds: Normal breath sounds.  Abdominal:     General: Bowel sounds are normal. There is no distension.     Palpations: Abdomen is soft.     Tenderness: There is no abdominal tenderness.  Musculoskeletal:     Cervical back: Neck supple.     Right lower leg: No edema.  Left lower leg: No edema.     Comments: Right hemiparesis   Lymphadenopathy:     Cervical: No cervical adenopathy.  Skin:    General: Skin is warm  and dry.  Neurological:     Mental Status: He is alert. Mental status is at baseline.  Psychiatric:        Mood and Affect: Mood normal.     ASSESSMENT/ PLAN:  TODAY:   Aortic atherosclerosis (ct 01-04-19) is on statin  2. History of dvt; has completed eliquis therapy  3. CAD native heart without angina; /p CABG 2012 will continue cozaar 25 mg daily   4. Hypertension associated with type 2 diabetes mellitus: b/p 103/65 will continue norvasc 5 mg daily and cozaar 25 mg daily    PREVIOUS   5. Dyslipidemia associated with type 2 diabetes mellitus; is stable ldl 65 will continue lipitor 40 mgm daily   6. Micro-albuminuria due to type 2 diabetes mellitus: is without change 1299.7 is on ARB  7. Major depression with psychotic features: is stable and presently off medications   8. CKD stage 3 due to type 2 diabetes mellitus: is stable bun 38; creat 2.13 GFR 31   9. Chronic urine retention/bladder spasms:  will continue to monitor   10. Type 2 diabetes mellitus with peripheral vascular disease: is stable hgb 6.7; will continue  humalog 5 units twice daily ; will increase lantus to 25 units is on statin arb   11. Protein calorie malnutrition severe: weight is 174; albumin 2.9 will continue supplements as directed   12. Nontraumatic subcortical hemorrhage left cerebral hemisphere/ right spastic hemiplegia/ stable will continue baclofen 5 mg three times  daily for spasticity      Ok Edwards NP [facility] Adult Medicine  call [phone]

## 2022-07-29 ENCOUNTER — Encounter: Payer: Self-pay | Admitting: Adult Health

## 2022-07-29 DIAGNOSIS — M8580 Other specified disorders of bone density and structure, unspecified site: Secondary | ICD-10-CM | POA: Insufficient documentation

## 2022-08-11 ENCOUNTER — Non-Acute Institutional Stay (SKILLED_NURSING_FACILITY): Payer: Medicare PPO | Admitting: Adult Health

## 2022-08-11 ENCOUNTER — Encounter: Payer: Self-pay | Admitting: Adult Health

## 2022-08-11 DIAGNOSIS — B372 Candidiasis of skin and nail: Secondary | ICD-10-CM | POA: Diagnosis not present

## 2022-08-11 NOTE — Progress Notes (Signed)
Location:  [place] Room Number: 130 Place of Service:  SNF (31)   CODE STATUS: dnr   No Known Allergies  Chief Complaint  Patient presents with   Acute Visit    Yeast in groin     HPI:  Staff report that he has yeast in his groin. There are white patches present and is tender to touch. He has tried topical treatment without improvement.   Past Medical History:  Diagnosis Date   A-fib [facility])    a. Post-op afib after CABG 08/2012.   Acute gastric ulcer with hemorrhage    Acute respiratory failure with hypoxia (HCC)    Aphasia following cerebral infarction    CAD (coronary artery disease)    a. NSTEMI s/p stent to distal RCA 03/2000. b. Inf MI s/p emergent thrombectomy/stenting mid RCA 10/2000. c. NSTEMI s/p CABGx4 (LIMA-LAD, SVG-OM2, seq SVG-acute marginal and PD) 12/17/53 - course complicated by confusion, post-op AF, L pleural effusion with thoracentesis. d. Normal LV function by echo 08/2012.   Diverticulosis    DM with CKD    Dysphagia following cerebral infarction    Encephalopathy    Extended spectrum beta lactamase (ESBL) resistance    Generalized anxiety disorder    GERD (gastroesophageal reflux disease)    Hemiplegia and hemiparesis following cerebral infarction affecting right dominant side (HCC)    Hiatal hernia    HLD (hyperlipidemia)    HTN (hypertension)    Non-ST elevation (NSTEMI) myocardial infarction [facility])    Nontraumatic intracerebral hemorrhage in hemisphere, subcortical [facility])    Peripheral vascular disease ([place])    a. Carotid dopplers neg 08/13/12. b. Pre-cabg ABIs - R=0.87 suggesting mild dz, L=1.29 possibly falsely elevated due to calcified vessels.   Pleural effusion    a. L pleural eff after CABG s/p thoracentesis 08/22/12.   Prostate cancer [facility])    Status post radiation treatment.   Prostatic hypertrophy    a. Hx of urinary retention, awaiting TURP.   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 Mild  MR by TEE 08/2012.    Past Surgical History:  Procedure Laterality Date   APPENDECTOMY     BACK SURGERY     CORONARY ARTERY BYPASS GRAFT  08/16/2012   Procedure: CORONARY ARTERY BYPASS GRAFTING (CABG);  Surgeon: Melrose Nakayama, MD;  Location: [place];  Service: Open Heart Surgery;  Laterality: N/A;   IR REPLC GASTRO/COLONIC TUBE PERCUT W/FLUORO  02/13/2019   LEFT HEART CATHETERIZATION WITH CORONARY ANGIOGRAM N/A 08/14/2012   Procedure: LEFT HEART CATHETERIZATION WITH CORONARY ANGIOGRAM;  Surgeon: Peter M Martinique, MD;  Location: [facility] CATH LAB;  Service: Cardiovascular;  Laterality: N/A;    Social History   Socioeconomic History   Marital status: Married    Spouse name: Not on file   Number of children: Not on file   Years of education: Not on file   Highest education level: Not on file  Occupational History   Occupation: retired   Tobacco Use   Smoking status: Former    Types: Cigarettes    Quit date: 11/12/1972    Years since quitting: 49.7   Smokeless tobacco: Never  Vaping Use   Vaping Use: Never used  Substance and Sexual Activity   Alcohol use: No   Drug use: No   Sexual activity: Not Currently  Other Topics Concern   Not on file  Social History Narrative   He is a long term patient of [facility]    Social Determinants of Health   Financial Resource Strain: Low Risk  (07/01/2019)   Overall Financial Resource Strain (CARDIA)    Difficulty of Paying Living Expenses: Not hard at all  Food Insecurity: Unknown (07/01/2019)   Hunger Vital Sign    Worried About [place] in the Last Year: Patient refused    [place] in the Last Year: Patient refused  Transportation Needs: Unknown (07/01/2019)   PRAPARE - Transportation    Lack of Transportation (Medical): Patient refused    Lack of Transportation (Non-Medical): Patient refused  Physical Activity: Inactive  (10/30/2019)   Exercise Vital Sign    Days of Exercise per Week: 0 days    Minutes of Exercise per Session: 0 min  Stress: Unknown (07/01/2019)   [place]    Feeling of Stress : Patient refused  Social Connections: Unknown (07/01/2019)   Social Connection and Isolation Panel [NHANES]    Frequency of Communication with Friends and Family: Patient refused    Frequency of Social Gatherings with Friends and Family: Patient refused    Attends Religious Services: Patient refused    Active Member of Clubs or Organizations: Patient refused    Attends Archivist Meetings: Patient refused    Marital Status: Patient refused  Intimate Partner Violence: Unknown (07/01/2019)   Humiliation, Afraid, Rape, and Kick questionnaire    Fear of Current or Ex-Partner: Patient refused    Emotionally Abused: Patient refused    Physically Abused: Patient refused    Sexually Abused: Patient refused   Family History  Problem Relation Age of Onset   Hypertension Mother       VITAL SIGNS BP 120/72   Pulse 70   Temp (!) 97.1 F (36.2 C)   Resp 20   Ht 5' 11"  (1.803 m)   Wt 174 lb (78.9 kg)   SpO2 96%   BMI 24.27 kg/m   Outpatient Encounter Medications as of 08/11/2022  Medication Sig   acetaminophen (TYLENOL) 325 MG tablet Take 650 mg by mouth every 4 (four) hours as needed. per standing order for pain/fever DO NOT EXCEED >3,000 mg in 24 hours   amLODipine (NORVASC) 5 MG tablet Take 5 mg by mouth daily.    atorvastatin (LIPITOR) 40 MG tablet Take 40 mg by mouth daily.   Baclofen 5 MG TABS Take 5 mg by mouth 3 (three) times daily. Nontraumatic intracerebral hemorrhage in hemisphere, subcortical   Balsam [place]-Castor Oil (VENELEX) OINT Apply topically. Special Instructions: Apply to sacrum, coccyx and bilateral buttocks qshift for prevention/ redness. Every Shift Day, Evening, Night   BD AUTOSHIELD DUO 30G X 5 MM MISC 3/16"    feeding supplement, GLUCERNA SHAKE, (GLUCERNA SHAKE) LIQD Take by mouth daily.   insulin glargine (LANTUS) 100 UNIT/ML injection Inject 20 Units into the skin daily.   insulin lispro (HUMALOG) 100 UNIT/ML cartridge Inject 5 Units into the skin in the morning and at bedtime. Give 5 units  with breakfast and lunch   lansoprazole (PREVACID SOLUTAB) 30 MG disintegrating tablet Take 30 mg by mouth daily. Give before meals   loratadine (CLARITIN) 10 MG tablet Take 10 mg by mouth daily.   losartan (COZAAR) 25 MG tablet Take 25 mg by mouth daily. DX: Hypertensive heart and chronic kidney disease without heart failure, with  stage 1 through stage 4 chronic kidney disease, or unspecified chronic kidney disease-per NP note, code updated   Melatonin 1 MG TABS Take 2 tablets by mouth at bedtime.    NON FORMULARY Diet Change: Regular, thin liquids   ondansetron (ZOFRAN-ODT) 4 MG disintegrating tablet Take 4 mg by mouth every 6 (six) hours as needed for nausea or vomiting.   rOPINIRole (REQUIP) 0.25 MG tablet Take 0.25 mg by mouth at bedtime.   No facility-administered encounter medications on file as of 08/11/2022.     SIGNIFICANT DIAGNOSTIC EXAMS  PREVIOUS   03-01-22: right lower extremity venous doppler: no evidence of dvt within visualized vein of right lower extremity   LABS REVIEWED PREVIOUS:     07-23-21: wbc 7.4; hgb 15.8; hct 50.3; mcv 94.7 plt 189; urine culture: enterococcus faecalis/providencia stuartii  08-09-21: wbc 8.3; hgb 14.5; hct 45.3; mcv 93.8 plt 192; glucose 127; bun 34; creat 1.83; k+ 4.1; na++ 140; ca 7.8; GFR 38; alk phos 130; albumin 2.6 urine culture: pseudomonas aeruginosa: cipro 09-09-21: hgb a1c 6.5; chol 95; ldl 58; trig 75; hdl 22; PSA 0.37 09-13-21: wbc 7.4; hgb 15.3; hct 48.5; mcv 94.0 plt 180; glucose 247; bun 43; creat 2.11; k+ 4.7; na++ 139; ca 8.3; GFR 32; d-dimer <0.27; CRP 2.34 09-29-21: wbc 8.0; hgb 14.9; hct 46.9; mcv 94.4 plt 170; glucose 168; bun 50; creat 2.04; k+  4.5; na++ 134; ca 7.9; GFR 33; urine culture: no growth  12-07-21: urine culture: mixed bacteria 01-03-22: glucose 71; bun 54; creat 2.15; k+ 4.4; na++ 138; ca 8.7; GFR 31; liver normal albumin 3.2; hgb a1c 6.2  01-10-22: glucose 89; bun 49; creat 1.90; k+ 4.2; na++ 141; ca 8.7; GFR 36 03-13-22: wbc 8.0; hgb 16.0; hct 51.2; mcv 993.6 plt 148; glucose 156; bun 47; creat 2.50; k+ 4.7; na++ 140; ca 8.7; GFR 26 CRP 6.4; d-dimer 0.41 03-21-22: crp: <0.5 06-27-22: wbc 7.7; hgb 15.4; hct 47.6; mcv 92.2 plt 159; glucose 151; bun 38; creat 2.13; k+ 4.1; na++ 142; ca 8.7; gfr 31; protein 6.1; albumin 2.9; hgb a1c 6.5; chol 103; ldl 65; trig 76; hdl 23   NO NEW LABS.   Review of Systems  Reason unable to perform ROS: aphasia.   Physical Exam Constitutional:      General: He is not in acute distress.    Appearance: He is well-developed. He is not diaphoretic.  Neck:     Thyroid: No thyromegaly.  Cardiovascular:     Rate and Rhythm: Normal rate and regular rhythm.     Pulses: Normal pulses.     Heart sounds: Normal heart sounds.  Pulmonary:     Effort: Pulmonary effort is normal. No respiratory distress.     Breath sounds: Normal breath sounds.  Abdominal:     General: Bowel sounds are normal. There is no distension.     Palpations: Abdomen is soft.     Tenderness: There is no abdominal tenderness.  Musculoskeletal:     Cervical back: Neck supple.     Right lower leg: No edema.     Left lower leg: No edema.     Comments: Right hemiparesis    Lymphadenopathy:     Cervical: No cervical adenopathy.  Skin:    General: Skin is warm and dry.     Comments: Groin area is red and inflamed with white patches present.   Neurological:     Mental Status: He is alert. Mental status is at baseline.  Psychiatric:  Mood and Affect: Mood normal.        ASSESSMENT/ PLAN:  TODAY  Skin yeast infection: will give diflucan 150 mg today then repeat in three days.    Ok Edwards NP [facility]  Adult Medicine  call [phone]

## 2022-08-16 DIAGNOSIS — B372 Candidiasis of skin and nail: Secondary | ICD-10-CM | POA: Insufficient documentation

## 2022-08-18 ENCOUNTER — Non-Acute Institutional Stay (SKILLED_NURSING_FACILITY): Payer: Medicare PPO | Admitting: Adult Health

## 2022-08-18 ENCOUNTER — Encounter: Payer: Self-pay | Admitting: Adult Health

## 2022-08-18 DIAGNOSIS — E1129 Type 2 diabetes mellitus with other diabetic kidney complication: Secondary | ICD-10-CM | POA: Diagnosis not present

## 2022-08-18 DIAGNOSIS — N3289 Other specified disorders of bladder: Secondary | ICD-10-CM | POA: Diagnosis not present

## 2022-08-18 DIAGNOSIS — E785 Hyperlipidemia, unspecified: Secondary | ICD-10-CM

## 2022-08-18 DIAGNOSIS — F323 Major depressive disorder, single episode, severe with psychotic features: Secondary | ICD-10-CM | POA: Diagnosis not present

## 2022-08-18 DIAGNOSIS — R809 Proteinuria, unspecified: Secondary | ICD-10-CM

## 2022-08-18 DIAGNOSIS — E1169 Type 2 diabetes mellitus with other specified complication: Secondary | ICD-10-CM

## 2022-08-18 DIAGNOSIS — R339 Retention of urine, unspecified: Secondary | ICD-10-CM | POA: Diagnosis not present

## 2022-08-18 NOTE — Progress Notes (Signed)
Location:  [place] Room Number: 130 Place of Service:  SNF (31)   CODE STATUS: dnr   No Known Allergies  Chief Complaint  Patient presents with   Medical Management of Chronic Issues                        Dyslipidemia associated with type 2 diabetes mellitus: Micro-albuminuria due to type 2 diabetes mellitus:  Major depression with psychotic features:  Chronic urine retention/bladder spasms:    HPI:  He is a 76 year old long term resident of this facility being seen for the management of his chronic illnesses:  Dyslipidemia associated with type 2 diabetes mellitus: Micro-albuminuria due to type 2 diabetes mellitus:  Major depression with psychotic features:  Chronic urine retention/bladder spasms. There are no reports of pain present. His weight is stable; has a good appetite. He states " I want to walk".   Past Medical History:  Diagnosis Date   A-fib [facility])    a. Post-op afib after CABG 08/2012.   Acute gastric ulcer with hemorrhage    Acute respiratory failure with hypoxia (HCC)    Aphasia following cerebral infarction    CAD (coronary artery disease)    a. NSTEMI s/p stent to distal RCA 03/2000. b. Inf MI s/p emergent thrombectomy/stenting mid RCA 10/2000. c. NSTEMI s/p CABGx4 (LIMA-LAD, SVG-OM2, seq SVG-acute marginal and PD) 03/13/31 - course complicated by confusion, post-op AF, L pleural effusion with thoracentesis. d. Normal LV function by echo 08/2012.   Diverticulosis    DM with CKD    Dysphagia following cerebral infarction    Encephalopathy    Extended spectrum beta lactamase (ESBL) resistance    Generalized anxiety disorder    GERD (gastroesophageal reflux disease)    Hemiplegia and hemiparesis following cerebral infarction affecting right dominant side (HCC)    Hiatal hernia    HLD (hyperlipidemia)    HTN (hypertension)    Non-ST elevation (NSTEMI) myocardial infarction [facility])    Nontraumatic intracerebral hemorrhage in hemisphere,  subcortical [facility])    Peripheral vascular disease ([place])    a. Carotid dopplers neg 08/13/12. b. Pre-cabg ABIs - R=0.87 suggesting mild dz, L=1.29 possibly falsely elevated due to calcified vessels.   Pleural effusion    a. L pleural eff after CABG s/p thoracentesis 08/22/12.   Prostate cancer [facility])    Status post radiation treatment.   Prostatic hypertrophy    a. Hx of urinary retention, awaiting TURP.   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 Mild  MR by TEE 08/2012.    Past Surgical History:  Procedure Laterality Date   APPENDECTOMY     BACK SURGERY     CORONARY ARTERY BYPASS GRAFT  08/16/2012   Procedure: CORONARY ARTERY BYPASS GRAFTING (CABG);  Surgeon: Melrose Nakayama, MD;  Location: [place];  Service: Open Heart Surgery;  Laterality: N/A;   IR REPLC GASTRO/COLONIC TUBE PERCUT W/FLUORO  02/13/2019   LEFT HEART CATHETERIZATION WITH CORONARY ANGIOGRAM N/A 08/14/2012   Procedure: LEFT HEART CATHETERIZATION WITH CORONARY ANGIOGRAM;  Surgeon: Peter M Martinique, MD;  Location: [facility] CATH LAB;  Service: Cardiovascular;  Laterality: N/A;    Social History   Socioeconomic History   Marital status: Married    Spouse name: Not on file   Number of children: Not  on file   Years of education: Not on file   Highest education level: Not on file  Occupational History   Occupation: retired   Tobacco Use   Smoking status: Former    Types: Cigarettes    Quit date: 11/12/1972    Years since quitting: 49.8   Smokeless tobacco: Never  Vaping Use   Vaping Use: Never used  Substance and Sexual Activity   Alcohol use: No   Drug use: No   Sexual activity: Not Currently  Other Topics Concern   Not on file  Social History Narrative   He is a long term patient of [facility]    Social Determinants of Health   Financial Resource Strain: Low Risk  (07/01/2019)   Overall Financial  Resource Strain (CARDIA)    Difficulty of Paying Living Expenses: Not hard at all  Food Insecurity: Unknown (07/01/2019)   Hunger Vital Sign    Worried About [place] in the Last Year: Patient refused    [place] in the Last Year: Patient refused  Transportation Needs: Unknown (07/01/2019)   PRAPARE - Transportation    Lack of Transportation (Medical): Patient refused    Lack of Transportation (Non-Medical): Patient refused  Physical Activity: Inactive (10/30/2019)   Exercise Vital Sign    Days of Exercise per Week: 0 days    Minutes of Exercise per Session: 0 min  Stress: Unknown (07/01/2019)   [place] of Stress : Patient refused  Social Connections: Unknown (07/01/2019)   Social Connection and Isolation Panel [NHANES]    Frequency of Communication with Friends and Family: Patient refused    Frequency of Social Gatherings with Friends and Family: Patient refused    Attends Religious Services: Patient refused    Active Member of Clubs or Organizations: Patient refused    Attends Archivist Meetings: Patient refused    Marital Status: Patient refused  Intimate Partner Violence: Unknown (07/01/2019)   Humiliation, Afraid, Rape, and Kick questionnaire    Fear of Current or Ex-Partner: Patient refused    Emotionally Abused: Patient refused    Physically Abused: Patient refused    Sexually Abused: Patient refused   Family History  Problem Relation Age of Onset   Hypertension Mother       VITAL SIGNS BP 119/69   Pulse 80   Temp (!) 97.3 F (36.3 C)   Resp 20   Ht '5\' 11"'$  (1.803 m)   Wt 174 lb (78.9 kg)   SpO2 95%   BMI 24.27 kg/m   Outpatient Encounter Medications as of 08/18/2022  Medication Sig   acetaminophen (TYLENOL) 325 MG tablet Take 650 mg by mouth every 4 (four) hours as needed. per standing order for pain/fever DO NOT EXCEED >3,000 mg in 24 hours   amLODipine  (NORVASC) 5 MG tablet Take 5 mg by mouth daily.    atorvastatin (LIPITOR) 40 MG tablet Take 40 mg by mouth daily.   Baclofen 5 MG TABS Take 5 mg by mouth 3 (three) times daily. Nontraumatic intracerebral hemorrhage in hemisphere, subcortical   Balsam [place]-Castor Oil (VENELEX) OINT Apply topically. Special Instructions: Apply to sacrum, coccyx and bilateral buttocks qshift for prevention/ redness. Every Shift Day, Evening, Night   BD AUTOSHIELD DUO 30G X 5 MM MISC 3/16"   calcium carbonate (OSCAL) 1500 (600 Ca) MG TABS tablet Take 600 mg of elemental calcium by mouth daily with breakfast.  cholecalciferol (VITAMIN D3) 25 MCG (1000 UNIT) tablet Take 1,000 Units by mouth daily.   feeding supplement, GLUCERNA SHAKE, (GLUCERNA SHAKE) LIQD Take by mouth daily.   insulin glargine (LANTUS) 100 UNIT/ML injection Inject 20 Units into the skin daily.   insulin lispro (HUMALOG) 100 UNIT/ML cartridge Inject 5 Units into the skin in the morning and at bedtime. Give 5 units  with breakfast and lunch   lansoprazole (PREVACID SOLUTAB) 30 MG disintegrating tablet Take 30 mg by mouth daily. Give before meals   loratadine (CLARITIN) 10 MG tablet Take 10 mg by mouth daily.   losartan (COZAAR) 25 MG tablet Take 25 mg by mouth daily. DX: Hypertensive heart and chronic kidney disease without heart failure, with stage 1 through stage 4 chronic kidney disease, or unspecified chronic kidney disease-per NP note, code updated   Melatonin 1 MG TABS Take 2 tablets by mouth at bedtime.    NON FORMULARY Diet Change: Regular, thin liquids   ondansetron (ZOFRAN-ODT) 4 MG disintegrating tablet Take 4 mg by mouth every 6 (six) hours as needed for nausea or vomiting.   rOPINIRole (REQUIP) 0.25 MG tablet Take 0.25 mg by mouth at bedtime.   No facility-administered encounter medications on file as of 08/18/2022.     SIGNIFICANT DIAGNOSTIC EXAMS   PREVIOUS   03-01-22: right lower extremity venous doppler: no evidence of dvt  within visualized vein of right lower extremity  TODAY;   07-27-22: dexa: t score -1.421   LABS REVIEWED PREVIOUS:    09-09-21: hgb a1c 6.5; chol 95; ldl 58; trig 75; hdl 22; PSA 0.37 09-13-21: wbc 7.4; hgb 15.3; hct 48.5; mcv 94.0 plt 180; glucose 247; bun 43; creat 2.11; k+ 4.7; na++ 139; ca 8.3; GFR 32; d-dimer <0.27; CRP 2.34 09-29-21: wbc 8.0; hgb 14.9; hct 46.9; mcv 94.4 plt 170; glucose 168; bun 50; creat 2.04; k+ 4.5; na++ 134; ca 7.9; GFR 33; urine culture: no growth  12-07-21: urine culture: mixed bacteria 01-03-22: glucose 71; bun 54; creat 2.15; k+ 4.4; na++ 138; ca 8.7; GFR 31; liver normal albumin 3.2; hgb a1c 6.2  01-10-22: glucose 89; bun 49; creat 1.90; k+ 4.2; na++ 141; ca 8.7; GFR 36 03-13-22: wbc 8.0; hgb 16.0; hct 51.2; mcv 993.6 plt 148; glucose 156; bun 47; creat 2.50; k+ 4.7; na++ 140; ca 8.7; GFR 26 CRP 6.4; d-dimer 0.41 03-21-22: crp: <0.5 06-27-22: wbc 7.7; hgb 15.4; hct 47.6; mcv 92.2 plt 159; glucose 151; bun 38; creat 2.13; k+ 4.1; na++ 142; ca 8.7; gfr 31; protein 6.1; albumin 2.9; hgb a1c 6.5; chol 103; ldl 65; trig 76; hdl 23   NO NEW LABS.   Review of Systems  Reason unable to perform ROS: aphasia.   Physical Exam Constitutional:      General: He is not in acute distress.    Appearance: He is well-developed. He is not diaphoretic.  Neck:     Thyroid: No thyromegaly.  Cardiovascular:     Rate and Rhythm: Normal rate and regular rhythm.     Pulses: Normal pulses.     Heart sounds: Normal heart sounds.  Pulmonary:     Effort: Pulmonary effort is normal. No respiratory distress.     Breath sounds: Normal breath sounds.  Abdominal:     General: Bowel sounds are normal. There is no distension.     Palpations: Abdomen is soft.     Tenderness: There is no abdominal tenderness.  Musculoskeletal:     Cervical back: Neck supple.  Right lower leg: No edema.     Left lower leg: No edema.     Comments: Right hemiparesis     Lymphadenopathy:     Cervical:  No cervical adenopathy.  Skin:    General: Skin is warm and dry.  Neurological:     Mental Status: He is alert. Mental status is at baseline.  Psychiatric:        Mood and Affect: Mood normal.       ASSESSMENT/ PLAN:  TODAY:   Dyslipidemia associated with type 2 diabetes mellitus: ldl 65 will continue lipitor 40 mg daily   2. Micro-albuminuria due to type 2 diabetes mellitus: 1299.7 is on ARB  3. Major depression with psychotic features: is off all medications  4. Chronic urine retention/bladder spasms: foley is out; is not on medications.   PREVIOUS   5. CKD stage 3 due to type 2 diabetes mellitus: is stable bun 38; creat 2.13 GFR 31   6. Type 2 diabetes mellitus with peripheral vascular disease: is stable hgb 6.7; will continue  humalog 5 units twice daily ; will increase lantus to 25 units is on statin arb   7. Protein calorie malnutrition severe: weight is 174; albumin 2.9 will continue supplements as directed   8. Nontraumatic subcortical hemorrhage left cerebral hemisphere/ right spastic hemiplegia/ stable will continue baclofen 5 mg three times  daily for spasticity   9. Aortic atherosclerosis (ct 01-04-19) is on statin  10. History of dvt; has completed eliquis therapy  11. CAD native heart without angina; s/p CABG 2012 will continue cozaar 25 mg daily   12. Hypertension associated with type 2 diabetes mellitus: b/p 119/69 will continue norvasc 5 mg daily and cozaar 25 mg daily   13. Osteopenia: t score -1.421 is on calcium and vitamin D supplements         Ok Edwards NP [facility] Adult Medicine  call [phone]

## 2022-08-24 ENCOUNTER — Non-Acute Institutional Stay (SKILLED_NURSING_FACILITY): Payer: Medicare PPO | Admitting: Adult Health

## 2022-08-24 ENCOUNTER — Encounter: Payer: Self-pay | Admitting: Adult Health

## 2022-08-24 DIAGNOSIS — E1151 Type 2 diabetes mellitus with diabetic peripheral angiopathy without gangrene: Secondary | ICD-10-CM | POA: Diagnosis not present

## 2022-08-24 NOTE — Progress Notes (Signed)
Location:  [facility] Room Number: 130 Place of Service:  SNF (31)   CODE STATUS: dnr   No Known Allergies  Chief Complaint  Patient presents with   Acute Visit    Diabetic concerns     HPI:  He is presently taking lantus 25 units nightly and humalog 5 units with meals. Staff report that yesterday he was lethargic. He did not eat lunch. In the later afternoon his cbg reading was in the 60's. He did eat. There are no reports of anxiety; no reports of diaphoresis. His hgb a1c is 6.5.   Past Medical History:  Diagnosis Date   A-fib [facility])    a. Post-op afib after CABG 08/2012.   Acute gastric ulcer with hemorrhage    Acute respiratory failure with hypoxia (HCC)    Aphasia following cerebral infarction    CAD (coronary artery disease)    a. NSTEMI s/p stent to distal RCA 03/2000. b. Inf MI s/p emergent thrombectomy/stenting mid RCA 10/2000. c. NSTEMI s/p CABGx4 (LIMA-LAD, SVG-OM2, seq SVG-acute marginal and PD) 06/14/24 - course complicated by confusion, post-op AF, L pleural effusion with thoracentesis. d. Normal LV function by echo 08/2012.   Diverticulosis    DM with CKD    Dysphagia following cerebral infarction    Encephalopathy    Extended spectrum beta lactamase (ESBL) resistance    Generalized anxiety disorder    GERD (gastroesophageal reflux disease)    Hemiplegia and hemiparesis following cerebral infarction affecting right dominant side (HCC)    Hiatal hernia    HLD (hyperlipidemia)    HTN (hypertension)    Non-ST elevation (NSTEMI) myocardial infarction [facility])    Nontraumatic intracerebral hemorrhage in hemisphere, subcortical [facility])    Peripheral vascular disease ([place])    a. Carotid dopplers neg 08/13/12. b. Pre-cabg ABIs - R=0.87 suggesting mild dz, L=1.29 possibly falsely elevated due to calcified vessels.   Pleural effusion    a. L pleural eff after CABG s/p thoracentesis 08/22/12.   Prostate cancer [facility])    Status post radiation treatment.    Prostatic hypertrophy    a. Hx of urinary retention, awaiting TURP.   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 Mild  MR by TEE 08/2012.    Past Surgical History:  Procedure Laterality Date   APPENDECTOMY     BACK SURGERY     CORONARY ARTERY BYPASS GRAFT  08/16/2012   Procedure: CORONARY ARTERY BYPASS GRAFTING (CABG);  Surgeon: Melrose Nakayama, MD;  Location: [facility];  Service: Open Heart Surgery;  Laterality: N/A;   IR REPLC GASTRO/COLONIC TUBE PERCUT W/FLUORO  02/13/2019   LEFT HEART CATHETERIZATION WITH CORONARY ANGIOGRAM N/A 08/14/2012   Procedure: LEFT HEART CATHETERIZATION WITH CORONARY ANGIOGRAM;  Surgeon: Peter M Martinique, MD;  Location: [facility] CATH LAB;  Service: Cardiovascular;  Laterality: N/A;    Social History   Socioeconomic History   Marital status: Married    Spouse name: Not on file   Number of children: Not on file   Years of education: Not on file   Highest education level: Not on file  Occupational History   Occupation: retired   Tobacco Use   Smoking status: Former    Types: Cigarettes    Quit date: 11/12/1972    Years since quitting: 49.8   Smokeless tobacco: Never  Vaping  Use   Vaping Use: Never used  Substance and Sexual Activity   Alcohol use: No   Drug use: No   Sexual activity: Not Currently  Other Topics Concern   Not on file  Social History Narrative   He is a long term patient of [facility]    Social Determinants of Health   Financial Resource Strain: Low Risk  (07/01/2019)   Overall Financial Resource Strain (CARDIA)    Difficulty of Paying Living Expenses: Not hard at all  Food Insecurity: Unknown (07/01/2019)   Hunger Vital Sign    Worried About [place] in the Last Year: Patient refused    [place] in the Last Year: Patient refused  Transportation Needs: Unknown (07/01/2019)   PRAPARE - Transportation     Lack of Transportation (Medical): Patient refused    Lack of Transportation (Non-Medical): Patient refused  Physical Activity: Inactive (10/30/2019)   Exercise Vital Sign    Days of Exercise per Week: 0 days    Minutes of Exercise per Session: 0 min  Stress: Unknown (07/01/2019)   [place] of Stress : Patient refused  Social Connections: Unknown (07/01/2019)   Social Connection and Isolation Panel [NHANES]    Frequency of Communication with Friends and Family: Patient refused    Frequency of Social Gatherings with Friends and Family: Patient refused    Attends Religious Services: Patient refused    Active Member of Clubs or Organizations: Patient refused    Attends Archivist Meetings: Patient refused    Marital Status: Patient refused  Intimate Partner Violence: Unknown (07/01/2019)   Humiliation, Afraid, Rape, and Kick questionnaire    Fear of Current or Ex-Partner: Patient refused    Emotionally Abused: Patient refused    Physically Abused: Patient refused    Sexually Abused: Patient refused   Family History  Problem Relation Age of Onset   Hypertension Mother       VITAL SIGNS BP 132/67   Pulse 71   Temp (!) 97.5 F (36.4 C)   Resp 20   Ht '5\' 11"'$  (1.803 m)   Wt 174 lb (78.9 kg)   SpO2 95%   BMI 24.27 kg/m   Outpatient Encounter Medications as of 08/24/2022  Medication Sig   acetaminophen (TYLENOL) 325 MG tablet Take 650 mg by mouth every 4 (four) hours as needed. per standing order for pain/fever DO NOT EXCEED >3,000 mg in 24 hours   amLODipine (NORVASC) 5 MG tablet Take 5 mg by mouth daily.    atorvastatin (LIPITOR) 40 MG tablet Take 40 mg by mouth daily.   Baclofen 5 MG TABS Take 5 mg by mouth 3 (three) times daily. Nontraumatic intracerebral hemorrhage in hemisphere, subcortical   Balsam [place]-Castor Oil (VENELEX) OINT Apply topically. Special Instructions: Apply to sacrum,  coccyx and bilateral buttocks qshift for prevention/ redness. Every Shift Day, Evening, Night   BD AUTOSHIELD DUO 30G X 5 MM MISC 3/16"   calcium carbonate (OSCAL) 1500 (600 Ca) MG TABS tablet Take 600 mg of elemental calcium by mouth daily with breakfast.   cholecalciferol (VITAMIN D3) 25 MCG (1000 UNIT) tablet Take 1,000 Units by mouth daily.   feeding supplement, GLUCERNA SHAKE, (GLUCERNA SHAKE) LIQD Take by mouth daily.   insulin glargine (LANTUS) 100 UNIT/ML injection Inject 20 Units into the skin daily.   insulin lispro (HUMALOG) 100 UNIT/ML cartridge Inject 5 Units into the skin in the  morning and at bedtime. Give 5 units  with breakfast and lunch   lansoprazole (PREVACID SOLUTAB) 30 MG disintegrating tablet Take 30 mg by mouth daily. Give before meals   loratadine (CLARITIN) 10 MG tablet Take 10 mg by mouth daily.   losartan (COZAAR) 25 MG tablet Take 25 mg by mouth daily. DX: Hypertensive heart and chronic kidney disease without heart failure, with stage 1 through stage 4 chronic kidney disease, or unspecified chronic kidney disease-per NP note, code updated   Melatonin 1 MG TABS Take 2 tablets by mouth at bedtime.    NON FORMULARY Diet Change: Regular, thin liquids   ondansetron (ZOFRAN-ODT) 4 MG disintegrating tablet Take 4 mg by mouth every 6 (six) hours as needed for nausea or vomiting.   rOPINIRole (REQUIP) 0.25 MG tablet Take 0.25 mg by mouth at bedtime.   No facility-administered encounter medications on file as of 08/24/2022.     SIGNIFICANT DIAGNOSTIC EXAMS  PREVIOUS   03-01-22: right lower extremity venous doppler: no evidence of dvt within visualized vein of right lower extremity  07-27-22: dexa: t score -1.421  NO NEW EXAMS    LABS REVIEWED PREVIOUS:    09-09-21: hgb a1c 6.5; chol 95; ldl 58; trig 75; hdl 22; PSA 0.37 09-13-21: wbc 7.4; hgb 15.3; hct 48.5; mcv 94.0 plt 180; glucose 247; bun 43; creat 2.11; k+ 4.7; na++ 139; ca 8.3; GFR 32; d-dimer <0.27; CRP  2.34 09-29-21: wbc 8.0; hgb 14.9; hct 46.9; mcv 94.4 plt 170; glucose 168; bun 50; creat 2.04; k+ 4.5; na++ 134; ca 7.9; GFR 33; urine culture: no growth  12-07-21: urine culture: mixed bacteria 01-03-22: glucose 71; bun 54; creat 2.15; k+ 4.4; na++ 138; ca 8.7; GFR 31; liver normal albumin 3.2; hgb a1c 6.2  01-10-22: glucose 89; bun 49; creat 1.90; k+ 4.2; na++ 141; ca 8.7; GFR 36 03-13-22: wbc 8.0; hgb 16.0; hct 51.2; mcv 993.6 plt 148; glucose 156; bun 47; creat 2.50; k+ 4.7; na++ 140; ca 8.7; GFR 26 CRP 6.4; d-dimer 0.41 03-21-22: crp: <0.5 06-27-22: wbc 7.7; hgb 15.4; hct 47.6; mcv 92.2 plt 159; glucose 151; bun 38; creat 2.13; k+ 4.1; na++ 142; ca 8.7; gfr 31; protein 6.1; albumin 2.9; hgb a1c 6.5; chol 103; ldl 65; trig 76; hdl 23   NO NEW LABS.   Review of Systems  Reason unable to perform ROS: aphasia.    Physical Exam Constitutional:      General: He is not in acute distress.    Appearance: He is well-developed. He is not diaphoretic.  Neck:     Thyroid: No thyromegaly.  Cardiovascular:     Rate and Rhythm: Normal rate and regular rhythm.     Pulses: Normal pulses.     Heart sounds: Normal heart sounds.  Pulmonary:     Effort: Pulmonary effort is normal. No respiratory distress.     Breath sounds: Normal breath sounds.  Abdominal:     General: Bowel sounds are normal. There is no distension.     Palpations: Abdomen is soft.     Tenderness: There is no abdominal tenderness.  Musculoskeletal:     Cervical back: Neck supple.     Right lower leg: No edema.     Left lower leg: No edema.     Comments: Right hemiparesis   Lymphadenopathy:     Cervical: No cervical adenopathy.  Skin:    General: Skin is warm and dry.  Neurological:     Mental Status: He is alert.  Mental status is at baseline.  Psychiatric:        Mood and Affect: Mood normal.       ASSESSMENT/ PLAN:  TODAY  Type 2 diabetes mellitus with peripheral vascular disease:   Will lower his lantus to 20  units nightly.  Will have staff give him humalog if eats more than 50% of his meals breakfast and lunch   Will get urine for micro-albumin 09-06-22    Ok Edwards NP [facility]  [phone]

## 2022-09-06 ENCOUNTER — Encounter (HOSPITAL_COMMUNITY)
Admission: RE | Admit: 2022-09-06 | Discharge: 2022-09-06 | Disposition: A | Discharge status: Home / Self Care | Payer: Medicare PPO | Source: Skilled Nursing Facility | Attending: Adult Health | Admitting: Adult Health

## 2022-09-06 DIAGNOSIS — E1151 Type 2 diabetes mellitus with diabetic peripheral angiopathy without gangrene: Secondary | ICD-10-CM | POA: Insufficient documentation

## 2022-09-07 LAB — MICROALBUMIN, URINE: Microalb, Ur: 456.2 ug/mL — ABNORMAL HIGH

## 2022-09-14 ENCOUNTER — Encounter: Payer: Self-pay | Admitting: Internal Medicine

## 2022-09-14 ENCOUNTER — Non-Acute Institutional Stay (SKILLED_NURSING_FACILITY): Payer: Medicare PPO | Admitting: Internal Medicine

## 2022-09-14 DIAGNOSIS — E1122 Type 2 diabetes mellitus with diabetic chronic kidney disease: Secondary | ICD-10-CM

## 2022-09-14 DIAGNOSIS — E785 Hyperlipidemia, unspecified: Secondary | ICD-10-CM | POA: Diagnosis not present

## 2022-09-14 DIAGNOSIS — E1169 Type 2 diabetes mellitus with other specified complication: Secondary | ICD-10-CM | POA: Diagnosis not present

## 2022-09-14 DIAGNOSIS — I4811 Longstanding persistent atrial fibrillation: Secondary | ICD-10-CM

## 2022-09-14 DIAGNOSIS — N183 Chronic kidney disease, stage 3 unspecified: Secondary | ICD-10-CM

## 2022-09-14 DIAGNOSIS — F323 Major depressive disorder, single episode, severe with psychotic features: Secondary | ICD-10-CM | POA: Diagnosis not present

## 2022-09-14 DIAGNOSIS — E1151 Type 2 diabetes mellitus with diabetic peripheral angiopathy without gangrene: Secondary | ICD-10-CM | POA: Diagnosis not present

## 2022-09-14 NOTE — Assessment & Plan Note (Signed)
LDL is at goal of less than 70 with a value of 65.  No change indicated.

## 2022-09-14 NOTE — Assessment & Plan Note (Addendum)
Clinically rhythm is slightly irregular at this time.  Rate is well controlled. EKG was performed which reveals sinus arrhythmia with PACs.  A-fib not present and no prophylactic anticoagulation indicated.

## 2022-09-14 NOTE — Assessment & Plan Note (Addendum)
A1c is 6.5%.  Fasting blood sugars average 152 and nightly glucose is 196.  There may be some discordance between the glucoses & the A1c because of the advanced CKD.

## 2022-09-14 NOTE — Patient Instructions (Signed)
See assessment and plan under each diagnosis in the problem list and acutely for this visit. Total time 45 minutes; greater than 50% of the visit spent counseling patient and coordinating care for problems addressed at this encounter   

## 2022-09-14 NOTE — Progress Notes (Signed)
NURSING HOME LOCATION:  [facility] ROOM NUMBER:  130 P  CODE STATUS:  DNR  PCP:  Ok Edwards NP  This is a nursing facility follow up visit of chronic medical diagnoses & to document compliance with Regulation 483.30 (c) in [employer] [place] Manual Phase 2 which mandates caregiver visit ( visits can alternate among physician, PA or NP as per statutes) within 10 days of 30 days / 60 days/ 90 days post admission to SNF date    Interim medical record and care since last SNF visit was updated with review of diagnostic studies and change in clinical status since last visit were documented.  HPI: He is a permanent resident of this facility with medical diagnoses of history of gastric ulcer with hemorrhage, history of respiratory failure, A-fib, history of cerebral infarction, CAD, history of diverticulosis, dyslipidemia, history of prostate cancer, diabetes with PVD, and protein/caloric malnutrition. He has undergone CABG.  LDL is at goal of less than 70 with a value of 65.  Low stage IIIb CKD is present with creatinine of 2.13 and GFR 31.  Albumin is 2.9 and total protein 6.1.  Diabetic control is excellent as manifested by an A1c of 6.5%.  Fasting blood sugars average 152; nightly glucoses average 196.  Range of fasting blood sugars is a low of 93 up to a high of 217.  Range of the evening glucose is a low of 139 up to a high of 300.  Review of systems: His wife is the history provider.  She states that he is becoming agitated when his son who has PTSD comes to visit and they have verbal conflict.  She believes that her husband has significant depression.  Another son died suddenly 3 weeks before the patient had his stroke.  That son had been severely injured by an IED resulting in bilateral pneumothoraces and left below the elbow amputation.  She notes that her husband has emotional lability and cries intermittently. She also validates some dysphagia.  Physical exam:   Pertinent or positive findings: He exhibits somewhat of a puzzled facial expression.  Pattern alopecia is present.  He is missing upper incisors.  The nasolabial folds are decreased, especially on the right.  Heart rhythm is irregular.  Pedal pulses are decreased.  He has limb atrophy of the legs.  There is a contracture of the right hand.  He has interosseous wasting of the hands.  There is trace edema at the sock line.  He has a benign appearing papule over the right lateral eyebrow as well as 1 over the left medial eyebrow.  The one on the left has a linear excoriation.  General appearance: no acute distress, increased work of breathing is present.   Lymphatic: No lymphadenopathy about the head, neck, axilla. Eyes: No conjunctival inflammation or lid edema is present. There is no scleral icterus. Ears:  External ear exam shows no significant lesions or deformities.   Nose:  External nasal examination shows no deformity or inflammation. Nasal mucosa are pink and moist without lesions, exudates Oral exam:  Lips and gums are healthy appearing. There is no oropharyngeal erythema or exudate. Neck:  No thyromegaly, masses, tenderness noted.    Heart:  No gallop, murmur, click, rub .  Lungs: Chest clear to auscultation without wheezes, rhonchi, rales, rubs. Abdomen: Bowel sounds are normal. Abdomen is soft and nontender with no organomegaly, hernias, masses. GU: Deferred  Extremities:  No cyanosis, clubbing  Neurologic exam :Balance,  Rhomberg, finger to nose testing could not be completed due to clinical state Skin: Warm & dry w/o tenting. No significant rash.  See summary under each active problem in the Problem List with associated updated therapeutic plan

## 2022-09-14 NOTE — Assessment & Plan Note (Signed)
Low CKD stage IIIb is present with a creatinine of 2.13 and GFR 31.  Med list reviewed; no change indicated unless there is progression of CKD.

## 2022-09-15 NOTE — Assessment & Plan Note (Signed)
[facility] & I have discussed the situation.  Consideration will be given to reporting  to the Safety Portal the potential risk associated with this emotionally labile situation for an individual with untreated PTSD and skill with automatic weapons.

## 2022-09-19 DIAGNOSIS — Z1159 Encounter for screening for other viral diseases: Secondary | ICD-10-CM | POA: Diagnosis not present

## 2022-09-19 DIAGNOSIS — Z1383 Encounter for screening for respiratory disorder NEC: Secondary | ICD-10-CM | POA: Diagnosis not present

## 2022-09-19 DIAGNOSIS — I69351 Hemiplegia and hemiparesis following cerebral infarction affecting right dominant side: Secondary | ICD-10-CM | POA: Diagnosis not present

## 2022-09-21 DIAGNOSIS — I69351 Hemiplegia and hemiparesis following cerebral infarction affecting right dominant side: Secondary | ICD-10-CM | POA: Diagnosis not present

## 2022-09-21 DIAGNOSIS — Z1383 Encounter for screening for respiratory disorder NEC: Secondary | ICD-10-CM | POA: Diagnosis not present

## 2022-09-21 DIAGNOSIS — Z1159 Encounter for screening for other viral diseases: Secondary | ICD-10-CM | POA: Diagnosis not present

## 2022-09-30 ENCOUNTER — Encounter: Payer: Self-pay | Admitting: Adult Health

## 2022-09-30 ENCOUNTER — Non-Acute Institutional Stay (SKILLED_NURSING_FACILITY): Payer: Medicare PPO | Admitting: Adult Health

## 2022-09-30 DIAGNOSIS — G8111 Spastic hemiplegia affecting right dominant side: Secondary | ICD-10-CM | POA: Diagnosis not present

## 2022-09-30 NOTE — Progress Notes (Unsigned)
Location:  [place] Room Number: NO/130/P Place of Service:  SNF (31)   CODE STATUS: DNR  No Known Allergies  Chief Complaint  Patient presents with   Acute Visit    Patient is here for pain of lower extremities    HPI:  He is reporting increased spasticity of his toes on his right foot. His toes are hyperextended. Unknown how long he has been having this pain. He is presently taking baclofen 5 mg three times daily .   Past Medical History:  Diagnosis Date   A-fib [facility])    a. Post-op afib after CABG 08/2012.   Acute gastric ulcer with hemorrhage    Acute respiratory failure with hypoxia (HCC)    Aphasia following cerebral infarction    CAD (coronary artery disease)    a. NSTEMI s/p stent to distal RCA 03/2000. b. Inf MI s/p emergent thrombectomy/stenting mid RCA 10/2000. c. NSTEMI s/p CABGx4 (LIMA-LAD, SVG-OM2, seq SVG-acute marginal and PD) 06/11/68 - course complicated by confusion, post-op AF, L pleural effusion with thoracentesis. d. Normal LV function by echo 08/2012.   Diverticulosis    DM with CKD    Dysphagia following cerebral infarction    Encephalopathy    Extended spectrum beta lactamase (ESBL) resistance    Generalized anxiety disorder    GERD (gastroesophageal reflux disease)    Hemiplegia and hemiparesis following cerebral infarction affecting right dominant side (HCC)    Hiatal hernia    HLD (hyperlipidemia)    HTN (hypertension)    Non-ST elevation (NSTEMI) myocardial infarction [facility])    Nontraumatic intracerebral hemorrhage in hemisphere, subcortical [facility])    Peripheral vascular disease ([place])    a. Carotid dopplers neg 08/13/12. b. Pre-cabg ABIs - R=0.87 suggesting mild dz, L=1.29 possibly falsely elevated due to calcified vessels.   Pleural effusion    a. L pleural eff after CABG s/p thoracentesis 08/22/12.   Prostate cancer [facility])    Status post radiation treatment.   Prostatic hypertrophy    a. Hx of urinary retention, awaiting  TURP.   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 Mild  MR by TEE 08/2012.    Past Surgical History:  Procedure Laterality Date   APPENDECTOMY     BACK SURGERY     CORONARY ARTERY BYPASS GRAFT  08/16/2012   Procedure: CORONARY ARTERY BYPASS GRAFTING (CABG);  Surgeon: Melrose Nakayama, MD;  Location: [place];  Service: Open Heart Surgery;  Laterality: N/A;   IR REPLC GASTRO/COLONIC TUBE PERCUT W/FLUORO  02/13/2019   LEFT HEART CATHETERIZATION WITH CORONARY ANGIOGRAM N/A 08/14/2012   Procedure: LEFT HEART CATHETERIZATION WITH CORONARY ANGIOGRAM;  Surgeon: Peter M Martinique, MD;  Location: [facility] CATH LAB;  Service: Cardiovascular;  Laterality: N/A;    Social History   Socioeconomic History   Marital status: Married    Spouse name: Not on file   Number of children: Not on file   Years of education: Not on file   Highest education level: Not on file  Occupational History   Occupation: retired   Tobacco Use   Smoking status: Former    Types: Cigarettes    Quit date: 11/12/1972    Years since quitting: 49.9   Smokeless tobacco: Never  Vaping Use   Vaping Use: Never used  Substance and Sexual Activity   Alcohol use:  No   Drug use: No   Sexual activity: Not Currently  Other Topics Concern   Not on file  Social History Narrative   He is a long term patient of [facility]    Social Determinants of Health   Financial Resource Strain: Low Risk  (07/01/2019)   Overall Financial Resource Strain (CARDIA)    Difficulty of Paying Living Expenses: Not hard at all  Food Insecurity: Unknown (07/01/2019)   Hunger Vital Sign    Worried About [place] in the Last Year: Patient refused    [place] in the Last Year: Patient refused  Transportation Needs: Unknown (07/01/2019)   PRAPARE - Transportation    Lack of Transportation (Medical): Patient refused    Lack of  Transportation (Non-Medical): Patient refused  Physical Activity: Inactive (10/30/2019)   Exercise Vital Sign    Days of Exercise per Week: 0 days    Minutes of Exercise per Session: 0 min  Stress: Unknown (07/01/2019)   [place] of Stress : Patient refused  Social Connections: Unknown (07/01/2019)   Social Connection and Isolation Panel [NHANES]    Frequency of Communication with Friends and Family: Patient refused    Frequency of Social Gatherings with Friends and Family: Patient refused    Attends Religious Services: Patient refused    Active Member of Clubs or Organizations: Patient refused    Attends Archivist Meetings: Patient refused    Marital Status: Patient refused  Intimate Partner Violence: Unknown (07/01/2019)   Humiliation, Afraid, Rape, and Kick questionnaire    Fear of Current or Ex-Partner: Patient refused    Emotionally Abused: Patient refused    Physically Abused: Patient refused    Sexually Abused: Patient refused   Family History  Problem Relation Age of Onset   Hypertension Mother       VITAL SIGNS BP 124/78   Pulse 68   Temp (!) 97.5 F (36.4 C)   Resp 20   Ht '5\' 11"'$  (1.803 m)   Wt 174 lb (78.9 kg)   SpO2 97%   BMI 24.27 kg/m   Outpatient Encounter Medications as of 09/30/2022  Medication Sig   acetaminophen (TYLENOL) 325 MG tablet Take 650 mg by mouth every 4 (four) hours as needed. per standing order for pain/fever DO NOT EXCEED >3,000 mg in 24 hours   amLODipine (NORVASC) 5 MG tablet Take 5 mg by mouth daily.    atorvastatin (LIPITOR) 40 MG tablet Take 40 mg by mouth daily.   Baclofen 5 MG TABS Take 5 mg by mouth 3 (three) times daily. Nontraumatic intracerebral hemorrhage in hemisphere, subcortical   Balsam [place]-Castor Oil (VENELEX) OINT Apply topically. Special Instructions: Apply to sacrum, coccyx and bilateral buttocks qshift for prevention/  redness. Every Shift Day, Evening, Night   BD AUTOSHIELD DUO 30G X 5 MM MISC 3/16"   calcium carbonate (OSCAL) 1500 (600 Ca) MG TABS tablet Take 600 mg of elemental calcium by mouth daily with breakfast.   cholecalciferol (VITAMIN D3) 25 MCG (1000 UNIT) tablet Take 1,000 Units by mouth daily.   feeding supplement, GLUCERNA SHAKE, (GLUCERNA SHAKE) LIQD Take by mouth daily.   insulin glargine (LANTUS) 100 UNIT/ML injection Inject 20 Units into the skin daily.   insulin lispro (HUMALOG) 100 UNIT/ML cartridge Inject 5 Units into the skin in the morning and at bedtime. Give 5 units  with breakfast and lunch give if he eats  more than 50% of meals   lansoprazole (PREVACID SOLUTAB) 30 MG disintegrating tablet Take 30 mg by mouth daily. Give before meals   loratadine (CLARITIN) 10 MG tablet Take 10 mg by mouth daily.   losartan (COZAAR) 25 MG tablet Take 25 mg by mouth daily. DX: Hypertensive heart and chronic kidney disease without heart failure, with stage 1 through stage 4 chronic kidney disease, or unspecified chronic kidney disease-per NP note, code updated   Melatonin 1 MG TABS Take 2 tablets by mouth at bedtime.    NON FORMULARY Diet Change: Regular, thin liquids   ondansetron (ZOFRAN-ODT) 4 MG disintegrating tablet Take 4 mg by mouth every 6 (six) hours as needed for nausea or vomiting.   rOPINIRole (REQUIP) 0.25 MG tablet Take 0.25 mg by mouth at bedtime.   No facility-administered encounter medications on file as of 09/30/2022.     SIGNIFICANT DIAGNOSTIC EXAMS  PREVIOUS   03-01-22: right lower extremity venous doppler: no evidence of dvt within visualized vein of right lower extremity  07-27-22: dexa: t score -1.421  NO NEW EXAMS    LABS REVIEWED PREVIOUS:    09-13-21: wbc 7.4; hgb 15.3; hct 48.5; mcv 94.0 plt 180; glucose 247; bun 43; creat 2.11; k+ 4.7; na++ 139; ca 8.3; GFR 32; d-dimer <0.27; CRP 2.34 09-29-21: wbc 8.0; hgb 14.9; hct 46.9; mcv 94.4 plt 170; glucose 168; bun 50;  creat 2.04; k+ 4.5; na++ 134; ca 7.9; GFR 33; urine culture: no growth  12-07-21: urine culture: mixed bacteria 01-03-22: glucose 71; bun 54; creat 2.15; k+ 4.4; na++ 138; ca 8.7; GFR 31; liver normal albumin 3.2; hgb a1c 6.2  01-10-22: glucose 89; bun 49; creat 1.90; k+ 4.2; na++ 141; ca 8.7; GFR 36 03-13-22: wbc 8.0; hgb 16.0; hct 51.2; mcv 993.6 plt 148; glucose 156; bun 47; creat 2.50; k+ 4.7; na++ 140; ca 8.7; GFR 26 CRP 6.4; d-dimer 0.41 03-21-22: crp: <0.5 06-27-22: wbc 7.7; hgb 15.4; hct 47.6; mcv 92.2 plt 159; glucose 151; bun 38; creat 2.13; k+ 4.1; na++ 142; ca 8.7; gfr 31; protein 6.1; albumin 2.9; hgb a1c 6.5; chol 103; ldl 65; trig 76; hdl 23   NO NEW LABS.   Review of Systems  Reason unable to perform ROS: aphasia.   Physical Exam Constitutional:      General: He is not in acute distress.    Appearance: He is well-developed. He is not diaphoretic.  Neck:     Thyroid: No thyromegaly.  Cardiovascular:     Rate and Rhythm: Normal rate and regular rhythm.     Pulses: Normal pulses.     Heart sounds: Normal heart sounds.  Pulmonary:     Effort: Pulmonary effort is normal. No respiratory distress.     Breath sounds: Normal breath sounds.  Abdominal:     General: Bowel sounds are normal. There is no distension.     Palpations: Abdomen is soft.     Tenderness: There is no abdominal tenderness.  Musculoskeletal:     Cervical back: Neck supple.     Right lower leg: No edema.     Left lower leg: No edema.     Comments: Right hemiparesis   Lymphadenopathy:     Cervical: No cervical adenopathy.  Skin:    General: Skin is warm and dry.  Neurological:     Mental Status: He is alert. Mental status is at baseline.  Psychiatric:        Mood and Affect: Mood normal.  ASSESSMENT/ PLAN:  TODAY  Right hemiplegia, spastic: is worse; will increase baclofen to 10 mg every 8 hours. Will monitor    Ok Edwards NP [facility]   call [phone]

## 2022-10-14 ENCOUNTER — Encounter: Payer: Self-pay | Admitting: Adult Health

## 2022-10-14 ENCOUNTER — Non-Acute Institutional Stay (SKILLED_NURSING_FACILITY): Payer: Medicare PPO | Admitting: Adult Health

## 2022-10-14 DIAGNOSIS — G8111 Spastic hemiplegia affecting right dominant side: Secondary | ICD-10-CM | POA: Diagnosis not present

## 2022-10-14 DIAGNOSIS — I700 Atherosclerosis of aorta: Secondary | ICD-10-CM | POA: Diagnosis not present

## 2022-10-14 DIAGNOSIS — E1159 Type 2 diabetes mellitus with other circulatory complications: Secondary | ICD-10-CM

## 2022-10-14 DIAGNOSIS — I152 Hypertension secondary to endocrine disorders: Secondary | ICD-10-CM

## 2022-10-14 NOTE — Progress Notes (Signed)
Location:  [place] Room Number: 829-H Place of Service:  SNF (31)   CODE STATUS: DNR  No Known Allergies  Chief Complaint  Patient presents with   Acute Visit    Care plan meeting    HPI:  We have come together for his care plan meeting. BIMS 5/15 moos 0/30. He is nonambulatory with no falls. He is dependent for his adls care. He is incontinent of bladder and bowel; does decline medications periodically . Dietary: weight is 174 pounds; regular diet 50-75%appetite; requires set up for meals. Therapy none at this time. Activities: occasionally participates; does sleep a lot. He continues to be followed for his chronic illnesses including:  Aortic atherosclerosis Hypertension associated with type 2 diabetes mellitus Right spastic hemiplegia  Past Medical History:  Diagnosis Date   A-fib ([place])    a. Post-op afib after CABG 08/2012.   Acute gastric ulcer with hemorrhage    Acute respiratory failure with hypoxia (HCC)    Aphasia following cerebral infarction    CAD (coronary artery disease)    a. NSTEMI s/p stent to distal RCA 03/2000. b. Inf MI s/p emergent thrombectomy/stenting mid RCA 10/2000. c. NSTEMI s/p CABGx4 (LIMA-LAD, SVG-OM2, seq SVG-acute marginal and PD) 02/16/15 - course complicated by confusion, post-op AF, L pleural effusion with thoracentesis. d. Normal LV function by echo 08/2012.   Diverticulosis    DM with CKD    Dysphagia following cerebral infarction    Encephalopathy    Extended spectrum beta lactamase (ESBL) resistance    Generalized anxiety disorder    GERD (gastroesophageal reflux disease)    Hemiplegia and hemiparesis following cerebral infarction affecting right dominant side (HCC)    Hiatal hernia    HLD (hyperlipidemia)    HTN (hypertension)    Non-ST elevation (NSTEMI) myocardial infarction [facility])    Nontraumatic intracerebral hemorrhage in hemisphere, subcortical [facility])    Peripheral vascular disease ([place])    a. Carotid dopplers  neg 08/13/12. b. Pre-cabg ABIs - R=0.87 suggesting mild dz, L=1.29 possibly falsely elevated due to calcified vessels.   Pleural effusion    a. L pleural eff after CABG s/p thoracentesis 08/22/12.   Prostate cancer [facility])    Status post radiation treatment.   Prostatic hypertrophy    a. Hx of urinary retention, awaiting TURP.   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 Mild  MR by TEE 08/2012.    Past Surgical History:  Procedure Laterality Date   APPENDECTOMY     BACK SURGERY     CORONARY ARTERY BYPASS GRAFT  08/16/2012   Procedure: CORONARY ARTERY BYPASS GRAFTING (CABG);  Surgeon: Melrose Nakayama, MD;  Location: [place];  Service: Open Heart Surgery;  Laterality: N/A;   IR REPLC GASTRO/COLONIC TUBE PERCUT W/FLUORO  02/13/2019   LEFT HEART CATHETERIZATION WITH CORONARY ANGIOGRAM N/A 08/14/2012   Procedure: LEFT HEART CATHETERIZATION WITH CORONARY ANGIOGRAM;  Surgeon: Peter M Martinique, MD;  Location: [facility] CATH LAB;  Service: Cardiovascular;  Laterality: N/A;    Social History   Socioeconomic History   Marital status: Married    Spouse name: Not on file   Number of children: Not on file   Years of education: Not on file   Highest education level: Not on file  Occupational History   Occupation: retired   Tobacco Use  Smoking status: Former    Types: Cigarettes    Quit date: 11/12/1972    Years since quitting: 49.9   Smokeless tobacco: Never  Vaping Use   Vaping Use: Never used  Substance and Sexual Activity   Alcohol use: No   Drug use: No   Sexual activity: Not Currently  Other Topics Concern   Not on file  Social History Narrative   He is a long term patient of [facility]    Social Determinants of Health   Financial Resource Strain: Low Risk  (07/01/2019)   Overall Financial Resource Strain (CARDIA)    Difficulty of Paying Living Expenses: Not hard at all   Food Insecurity: Unknown (07/01/2019)   Hunger Vital Sign    Worried About [place] in the Last Year: Patient refused    [place] in the Last Year: Patient refused  Transportation Needs: Unknown (07/01/2019)   PRAPARE - Transportation    Lack of Transportation (Medical): Patient refused    Lack of Transportation (Non-Medical): Patient refused  Physical Activity: Inactive (10/30/2019)   Exercise Vital Sign    Days of Exercise per Week: 0 days    Minutes of Exercise per Session: 0 min  Stress: Unknown (07/01/2019)   [place] of Stress : Patient refused  Social Connections: Unknown (07/01/2019)   Social Connection and Isolation Panel [NHANES]    Frequency of Communication with Friends and Family: Patient refused    Frequency of Social Gatherings with Friends and Family: Patient refused    Attends Religious Services: Patient refused    Active Member of Clubs or Organizations: Patient refused    Attends Archivist Meetings: Patient refused    Marital Status: Patient refused  Intimate Partner Violence: Unknown (07/01/2019)   Humiliation, Afraid, Rape, and Kick questionnaire    Fear of Current or Ex-Partner: Patient refused    Emotionally Abused: Patient refused    Physically Abused: Patient refused    Sexually Abused: Patient refused   Family History  Problem Relation Age of Onset   Hypertension Mother       VITAL SIGNS BP 125/74   Pulse 70   Temp (!) 97.1 F (36.2 C)   Resp 20   Ht '5\' 11"'$  (1.803 m)   Wt 184 lb 8 oz (83.7 kg)   SpO2 97%   BMI 25.73 kg/m   Outpatient Encounter Medications as of 10/14/2022  Medication Sig   acetaminophen (TYLENOL) 325 MG tablet Take 650 mg by mouth every 4 (four) hours as needed. per standing order for pain/fever DO NOT EXCEED >3,000 mg in 24 hours   amLODipine (NORVASC) 5 MG tablet Take 5 mg by mouth daily.    atorvastatin (LIPITOR) 40  MG tablet Take 40 mg by mouth daily.   baclofen (LIORESAL) 10 MG tablet Take 10 mg by mouth 3 (three) times daily.   Balsam [place]-Castor Oil (VENELEX) OINT Apply topically. Special Instructions: Apply to sacrum, coccyx and bilateral buttocks qshift for prevention/ redness. Every Shift Day, Evening, Night   BD AUTOSHIELD DUO 30G X 5 MM MISC 3/16"   calcium carbonate (OSCAL) 1500 (600 Ca) MG TABS tablet Take 600 mg of elemental calcium by mouth daily with breakfast.   cholecalciferol (VITAMIN D3) 25 MCG (1000 UNIT) tablet Take 1,000 Units by mouth daily.   feeding supplement, GLUCERNA SHAKE, (GLUCERNA SHAKE) LIQD Take by mouth daily.   FLUoxetine (PROZAC) 20 MG  capsule Take 20 mg by mouth daily.   hydrocortisone cream 0.5 % Apply 1 Application topically 2 (two) times daily as needed. apply to face around residents mouth   insulin glargine (LANTUS) 100 UNIT/ML injection Inject 20 Units into the skin daily.   insulin lispro (HUMALOG) 100 UNIT/ML cartridge Inject 5 Units into the skin in the morning and at bedtime. Give 5 units  with breakfast and lunch give if he eats more than 50% of meals   lansoprazole (PREVACID SOLUTAB) 30 MG disintegrating tablet Take 30 mg by mouth daily. Give before meals   loratadine (CLARITIN) 10 MG tablet Take 10 mg by mouth daily.   losartan (COZAAR) 25 MG tablet Take 25 mg by mouth daily. DX: Hypertensive heart and chronic kidney disease without heart failure, with stage 1 through stage 4 chronic kidney disease, or unspecified chronic kidney disease-per NP note, code updated   Melatonin 1 MG TABS Take 2 tablets by mouth at bedtime.    NON FORMULARY Diet Change: Regular, thin liquids   ondansetron (ZOFRAN-ODT) 4 MG disintegrating tablet Take 4 mg by mouth every 6 (six) hours as needed for nausea or vomiting.   rOPINIRole (REQUIP) 0.25 MG tablet Take 0.25 mg by mouth at bedtime.   No facility-administered encounter medications on file as of 10/14/2022.     SIGNIFICANT  DIAGNOSTIC EXAMS  PREVIOUS   03-01-22: right lower extremity venous doppler: no evidence of dvt within visualized vein of right lower extremity  07-27-22: dexa: t score -1.421  NO NEW EXAMS    LABS REVIEWED PREVIOUS:    12-07-21: urine culture: mixed bacteria 01-03-22: glucose 71; bun 54; creat 2.15; k+ 4.4; na++ 138; ca 8.7; GFR 31; liver normal albumin 3.2; hgb a1c 6.2  01-10-22: glucose 89; bun 49; creat 1.90; k+ 4.2; na++ 141; ca 8.7; GFR 36 03-13-22: wbc 8.0; hgb 16.0; hct 51.2; mcv 993.6 plt 148; glucose 156; bun 47; creat 2.50; k+ 4.7; na++ 140; ca 8.7; GFR 26 CRP 6.4; d-dimer 0.41 03-21-22: crp: <0.5 06-27-22: wbc 7.7; hgb 15.4; hct 47.6; mcv 92.2 plt 159; glucose 151; bun 38; creat 2.13; k+ 4.1; na++ 142; ca 8.7; gfr 31; protein 6.1; albumin 2.9; hgb a1c 6.5; chol 103; ldl 65; trig 76; hdl 23   NO NEW LABS.   Review of Systems  Reason unable to perform ROS: aphasia.    Physical Exam Constitutional:      General: He is not in acute distress.    Appearance: He is well-developed. He is not diaphoretic.  Neck:     Thyroid: No thyromegaly.  Cardiovascular:     Rate and Rhythm: Normal rate and regular rhythm.     Pulses: Normal pulses.     Heart sounds: Normal heart sounds.  Pulmonary:     Effort: Pulmonary effort is normal. No respiratory distress.     Breath sounds: Normal breath sounds.  Abdominal:     General: Bowel sounds are normal. There is no distension.     Palpations: Abdomen is soft.     Tenderness: There is no abdominal tenderness.  Musculoskeletal:     Cervical back: Neck supple.     Right lower leg: No edema.     Left lower leg: No edema.     Comments: Right hemiplegia   Lymphadenopathy:     Cervical: No cervical adenopathy.  Skin:    General: Skin is warm and dry.  Neurological:     Mental Status: He is alert. Mental status is at  baseline.  Psychiatric:        Mood and Affect: Mood normal.       ASSESSMENT/ PLAN:  TODAY  Aortic  atherosclerosis Hypertension associated with type 2 diabetes mellitus Right spastic hemiplegia  Will continue current medications Will continue current plan of care Will continue to monitor his status.   Time spent with patient: 40 minutes: plan of care medications; activities.    Ok Edwards NP [facility]   call [phone]

## 2022-10-20 ENCOUNTER — Non-Acute Institutional Stay (SKILLED_NURSING_FACILITY): Payer: Medicare PPO | Admitting: Adult Health

## 2022-10-20 DIAGNOSIS — I6930 Unspecified sequelae of cerebral infarction: Secondary | ICD-10-CM

## 2022-10-20 DIAGNOSIS — N183 Chronic kidney disease, stage 3 unspecified: Secondary | ICD-10-CM | POA: Diagnosis not present

## 2022-10-20 DIAGNOSIS — E1122 Type 2 diabetes mellitus with diabetic chronic kidney disease: Secondary | ICD-10-CM | POA: Diagnosis not present

## 2022-10-20 DIAGNOSIS — E1151 Type 2 diabetes mellitus with diabetic peripheral angiopathy without gangrene: Secondary | ICD-10-CM | POA: Diagnosis not present

## 2022-10-20 DIAGNOSIS — G8111 Spastic hemiplegia affecting right dominant side: Secondary | ICD-10-CM | POA: Diagnosis not present

## 2022-10-21 ENCOUNTER — Encounter: Payer: Self-pay | Admitting: Adult Health

## 2022-10-21 NOTE — Progress Notes (Unsigned)
Location:  [place] Room Number: NO/130/P Place of Service:  SNF (31) Ok Edwards S.,NP  CODE STATUS: DNR  No Known Allergies  Chief Complaint  Patient presents with   Medical Management of Chronic Issues    Patient is here for a follow up for chronic conditions    Quality Metric Gaps    Patient is due for urine ACR, eye and foot exam, and upcoming AWV by 11/2022 Patient is up to date on vaccines and NCIR verified.    HPI:    Past Medical History:  Diagnosis Date   A-fib ([place])    a. Post-op afib after CABG 08/2012.   Acute gastric ulcer with hemorrhage    Acute respiratory failure with hypoxia (HCC)    Aphasia following cerebral infarction    CAD (coronary artery disease)    a. NSTEMI s/p stent to distal RCA 03/2000. b. Inf MI s/p emergent thrombectomy/stenting mid RCA 10/2000. c. NSTEMI s/p CABGx4 (LIMA-LAD, SVG-OM2, seq SVG-acute marginal and PD) 0/9/98 - course complicated by confusion, post-op AF, L pleural effusion with thoracentesis. d. Normal LV function by echo 08/2012.   Diverticulosis    DM with CKD    Dysphagia following cerebral infarction    Encephalopathy    Extended spectrum beta lactamase (ESBL) resistance    Generalized anxiety disorder    GERD (gastroesophageal reflux disease)    Hemiplegia and hemiparesis following cerebral infarction affecting right dominant side (HCC)    Hiatal hernia    HLD (hyperlipidemia)    HTN (hypertension)    Non-ST elevation (NSTEMI) myocardial infarction [facility])    Nontraumatic intracerebral hemorrhage in hemisphere, subcortical [facility])    Peripheral vascular disease ([place])    a. Carotid dopplers neg 08/13/12. b. Pre-cabg ABIs - R=0.87 suggesting mild dz, L=1.29 possibly falsely elevated due to calcified vessels.   Pleural effusion    a. L pleural eff after CABG s/p thoracentesis 08/22/12.   Prostate cancer [facility])    Status post radiation treatment.   Prostatic hypertrophy    a. Hx of urinary retention,  awaiting TURP.   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 Mild  MR by TEE 08/2012.    Past Surgical History:  Procedure Laterality Date   APPENDECTOMY     BACK SURGERY     CORONARY ARTERY BYPASS GRAFT  08/16/2012   Procedure: CORONARY ARTERY BYPASS GRAFTING (CABG);  Surgeon: Melrose Nakayama, MD;  Location: [place];  Service: Open Heart Surgery;  Laterality: N/A;   IR REPLC GASTRO/COLONIC TUBE PERCUT W/FLUORO  02/13/2019   LEFT HEART CATHETERIZATION WITH CORONARY ANGIOGRAM N/A 08/14/2012   Procedure: LEFT HEART CATHETERIZATION WITH CORONARY ANGIOGRAM;  Surgeon: Peter M Martinique, MD;  Location: [facility] CATH LAB;  Service: Cardiovascular;  Laterality: N/A;    Social History   Socioeconomic History   Marital status: Married    Spouse name: Not on file   Number of children: Not on file   Years of education: Not on file   Highest education level: Not on file  Occupational History   Occupation: retired   Tobacco Use   Smoking status: Former    Types: Cigarettes    Quit date: 11/12/1972    Years since quitting: 49.9   Smokeless tobacco: Never  Vaping Use   Vaping Use: Never used  Substance and Sexual Activity  Alcohol use: No   Drug use: No   Sexual activity: Not Currently  Other Topics Concern   Not on file  Social History Narrative   He is a long term patient of [facility]    Social Determinants of Health   Financial Resource Strain: Low Risk  (07/01/2019)   Overall Financial Resource Strain (CARDIA)    Difficulty of Paying Living Expenses: Not hard at all  Food Insecurity: Unknown (07/01/2019)   Hunger Vital Sign    Worried About [place] in the Last Year: Patient refused    [place] in the Last Year: Patient refused  Transportation Needs: Unknown (07/01/2019)   PRAPARE - Transportation    Lack of Transportation (Medical): Patient refused     Lack of Transportation (Non-Medical): Patient refused  Physical Activity: Inactive (10/30/2019)   Exercise Vital Sign    Days of Exercise per Week: 0 days    Minutes of Exercise per Session: 0 min  Stress: Unknown (07/01/2019)   [place] of Stress : Patient refused  Social Connections: Unknown (07/01/2019)   Social Connection and Isolation Panel [NHANES]    Frequency of Communication with Friends and Family: Patient refused    Frequency of Social Gatherings with Friends and Family: Patient refused    Attends Religious Services: Patient refused    Active Member of Clubs or Organizations: Patient refused    Attends Archivist Meetings: Patient refused    Marital Status: Patient refused  Intimate Partner Violence: Unknown (07/01/2019)   Humiliation, Afraid, Rape, and Kick questionnaire    Fear of Current or Ex-Partner: Patient refused    Emotionally Abused: Patient refused    Physically Abused: Patient refused    Sexually Abused: Patient refused   Family History  Problem Relation Age of Onset   Hypertension Mother       VITAL SIGNS BP 112/67   Pulse 86   Temp 97.8 F (36.6 C)   Resp 17   Ht '5\' 11"'$  (1.803 m)   Wt 185 lb (83.9 kg)   SpO2 97%   BMI 25.80 kg/m   Outpatient Encounter Medications as of 10/20/2022  Medication Sig   acetaminophen (TYLENOL) 325 MG tablet Take 650 mg by mouth every 4 (four) hours as needed. per standing order for pain/fever DO NOT EXCEED >3,000 mg in 24 hours   amLODipine (NORVASC) 5 MG tablet Take 5 mg by mouth daily.    atorvastatin (LIPITOR) 40 MG tablet Take 40 mg by mouth daily.   baclofen (LIORESAL) 10 MG tablet Take 10 mg by mouth 3 (three) times daily.   Balsam [place]-Castor Oil (VENELEX) OINT Apply topically. Special Instructions: Apply to sacrum, coccyx and bilateral buttocks qshift for prevention/ redness. Every Shift Day, Evening, Night   BD AUTOSHIELD  DUO 30G X 5 MM MISC 3/16"   calcium carbonate (OSCAL) 1500 (600 Ca) MG TABS tablet Take 600 mg of elemental calcium by mouth daily with breakfast.   cholecalciferol (VITAMIN D3) 25 MCG (1000 UNIT) tablet Take 1,000 Units by mouth daily.   feeding supplement, GLUCERNA SHAKE, (GLUCERNA SHAKE) LIQD Take by mouth daily.   FLUoxetine (PROZAC) 20 MG capsule Take 20 mg by mouth daily.   hydrocortisone cream 0.5 % Apply 1 Application topically 2 (two) times daily as needed. apply to face around residents mouth   insulin glargine (LANTUS) 100 UNIT/ML injection Inject 20 Units into the skin daily.  insulin lispro (HUMALOG) 100 UNIT/ML cartridge Inject 5 Units into the skin in the morning and at bedtime. Give 5 units  with breakfast and lunch give if he eats more than 50% of meals   lansoprazole (PREVACID SOLUTAB) 30 MG disintegrating tablet Take 30 mg by mouth daily. Give before meals   loratadine (CLARITIN) 10 MG tablet Take 10 mg by mouth daily.   losartan (COZAAR) 25 MG tablet Take 25 mg by mouth daily. DX: Hypertensive heart and chronic kidney disease without heart failure, with stage 1 through stage 4 chronic kidney disease, or unspecified chronic kidney disease-per NP note, code updated   Melatonin 1 MG TABS Take 2 tablets by mouth at bedtime.    NON FORMULARY Diet Change: Regular, thin liquids   ondansetron (ZOFRAN-ODT) 4 MG disintegrating tablet Take 4 mg by mouth every 6 (six) hours as needed for nausea or vomiting.   rOPINIRole (REQUIP) 0.25 MG tablet Take 0.25 mg by mouth at bedtime.   No facility-administered encounter medications on file as of 10/20/2022.     SIGNIFICANT DIAGNOSTIC EXAMS       ASSESSMENT/ PLAN:     Ok Edwards NP [facility] Adult Medicine  Contact [phone] Monday through Friday 8am- 5pm  After hours call [phone]

## 2022-11-13 ENCOUNTER — Encounter (HOSPITAL_COMMUNITY)
Admission: RE | Admit: 2022-11-13 | Discharge: 2022-11-13 | Disposition: A | Discharge status: Home / Self Care | Payer: Medicare PPO | Source: Skilled Nursing Facility | Attending: Adult Health | Admitting: Adult Health

## 2022-11-13 DIAGNOSIS — E1151 Type 2 diabetes mellitus with diabetic peripheral angiopathy without gangrene: Secondary | ICD-10-CM | POA: Insufficient documentation

## 2022-11-14 LAB — MICROALBUMIN / CREATININE URINE RATIO
Creatinine, Urine: 79.7 mg/dL
Microalb Creat Ratio: 480 mg/g creat — ABNORMAL HIGH (ref 0–29)
Microalb, Ur: 382.3 ug/mL — ABNORMAL HIGH

## 2022-11-14 LAB — HEMOGLOBIN A1C
Hgb A1c MFr Bld: 7.5 % — ABNORMAL HIGH (ref 4.8–5.6)
Mean Plasma Glucose: 169 mg/dL

## 2022-11-15 ENCOUNTER — Encounter: Payer: Self-pay | Admitting: Adult Health

## 2022-11-15 ENCOUNTER — Non-Acute Institutional Stay (SKILLED_NURSING_FACILITY): Payer: Medicare PPO | Admitting: Adult Health

## 2022-11-15 DIAGNOSIS — E1151 Type 2 diabetes mellitus with diabetic peripheral angiopathy without gangrene: Secondary | ICD-10-CM | POA: Diagnosis not present

## 2022-11-15 NOTE — Progress Notes (Unsigned)
Location:  [place] Room Number: NO/130/P Place of Service:  SNF (31) Laurina Fischl S.,NP  CODE STATUS: DNR  No Known Allergies  Chief Complaint  Patient presents with   Acute Visit    Patient is being seen for diabetic concerns    HPI:  His hgb A1c is 7.5 up from 6.5.  there are no reports of changes in appetite. He has declined medications at times. There are no reports of excessive thirst or urination. He is presently taking lantus and humalog.   Past Medical History:  Diagnosis Date   A-fib [facility])    a. Post-op afib after CABG 08/2012.   Acute gastric ulcer with hemorrhage    Acute respiratory failure with hypoxia (HCC)    Aphasia following cerebral infarction    CAD (coronary artery disease)    a. NSTEMI s/p stent to distal RCA 03/2000. b. Inf MI s/p emergent thrombectomy/stenting mid RCA 10/2000. c. NSTEMI s/p CABGx4 (LIMA-LAD, SVG-OM2, seq SVG-acute marginal and PD) 01/18/32 - course complicated by confusion, post-op AF, L pleural effusion with thoracentesis. d. Normal LV function by echo 08/2012.   Diverticulosis    DM with CKD    Dysphagia following cerebral infarction    Encephalopathy    Extended spectrum beta lactamase (ESBL) resistance    Generalized anxiety disorder    GERD (gastroesophageal reflux disease)    Hemiplegia and hemiparesis following cerebral infarction affecting right dominant side (HCC)    Hiatal hernia    HLD (hyperlipidemia)    HTN (hypertension)    Non-ST elevation (NSTEMI) myocardial infarction [facility])    Nontraumatic intracerebral hemorrhage in hemisphere, subcortical [facility])    Peripheral vascular disease ([place])    a. Carotid dopplers neg 08/13/12. b. Pre-cabg ABIs - R=0.87 suggesting mild dz, L=1.29 possibly falsely elevated due to calcified vessels.   Pleural effusion    a. L pleural eff after CABG s/p thoracentesis 08/22/12.   Prostate cancer [facility])    Status post radiation treatment.   Prostatic hypertrophy    a. Hx of  urinary retention, awaiting TURP.   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 Mild  MR by TEE 08/2012.    Past Surgical History:  Procedure Laterality Date   APPENDECTOMY     BACK SURGERY     CORONARY ARTERY BYPASS GRAFT  08/16/2012   Procedure: CORONARY ARTERY BYPASS GRAFTING (CABG);  Surgeon: Melrose Nakayama, MD;  Location: [place];  Service: Open Heart Surgery;  Laterality: N/A;   IR REPLC GASTRO/COLONIC TUBE PERCUT W/FLUORO  02/13/2019   LEFT HEART CATHETERIZATION WITH CORONARY ANGIOGRAM N/A 08/14/2012   Procedure: LEFT HEART CATHETERIZATION WITH CORONARY ANGIOGRAM;  Surgeon: Peter M Martinique, MD;  Location: [facility] CATH LAB;  Service: Cardiovascular;  Laterality: N/A;    Social History   Socioeconomic History   Marital status: Married    Spouse name: Not on file   Number of children: Not on file   Years of education: Not on file   Highest education level: Not on file  Occupational History   Occupation: retired   Tobacco Use   Smoking status: Former    Types: Cigarettes    Quit date: 11/12/1972    Years since quitting: 50.0   Smokeless tobacco: Never  Vaping Use   Vaping Use: Never used  Substance and Sexual Activity  Alcohol use: No   Drug use: No   Sexual activity: Not Currently  Other Topics Concern   Not on file  Social History Narrative   He is a long term patient of [facility]    Social Determinants of Health   Financial Resource Strain: Low Risk  (07/01/2019)   Overall Financial Resource Strain (CARDIA)    Difficulty of Paying Living Expenses: Not hard at all  Food Insecurity: Unknown (07/01/2019)   Hunger Vital Sign    Worried About [place] in the Last Year: Patient refused    [place] in the Last Year: Patient refused  Transportation Needs: Unknown (07/01/2019)   PRAPARE - Transportation    Lack of Transportation (Medical):  Patient refused    Lack of Transportation (Non-Medical): Patient refused  Physical Activity: Inactive (10/30/2019)   Exercise Vital Sign    Days of Exercise per Week: 0 days    Minutes of Exercise per Session: 0 min  Stress: Unknown (07/01/2019)   [place] of Stress : Patient refused  Social Connections: Unknown (07/01/2019)   Social Connection and Isolation Panel [NHANES]    Frequency of Communication with Friends and Family: Patient refused    Frequency of Social Gatherings with Friends and Family: Patient refused    Attends Religious Services: Patient refused    Active Member of Clubs or Organizations: Patient refused    Attends Archivist Meetings: Patient refused    Marital Status: Patient refused  Intimate Partner Violence: Unknown (07/01/2019)   Humiliation, Afraid, Rape, and Kick questionnaire    Fear of Current or Ex-Partner: Patient refused    Emotionally Abused: Patient refused    Physically Abused: Patient refused    Sexually Abused: Patient refused   Family History  Problem Relation Age of Onset   Hypertension Mother       VITAL SIGNS BP 136/80   Pulse 67   Temp (!) 97.2 F (36.2 C)   Resp (!) 24   Ht '5\' 11"'$  (1.803 m)   Wt 177 lb 8 oz (80.5 kg)   SpO2 95%   BMI 24.76 kg/m   Outpatient Encounter Medications as of 11/15/2022  Medication Sig   acetaminophen (TYLENOL) 325 MG tablet Take 650 mg by mouth every 4 (four) hours as needed. per standing order for pain/fever DO NOT EXCEED >3,000 mg in 24 hours   amLODipine (NORVASC) 5 MG tablet Take 5 mg by mouth daily.    atorvastatin (LIPITOR) 40 MG tablet Take 40 mg by mouth daily.   baclofen (LIORESAL) 10 MG tablet Take 10 mg by mouth 3 (three) times daily.   Balsam [place]-Castor Oil (VENELEX) OINT Apply topically. Special Instructions: Apply to sacrum, coccyx and bilateral buttocks qshift for prevention/ redness. Every  Shift Day, Evening, Night   BD AUTOSHIELD DUO 30G X 5 MM MISC 3/16"   calcium carbonate (OSCAL) 1500 (600 Ca) MG TABS tablet Take 600 mg of elemental calcium by mouth daily with breakfast.   cholecalciferol (VITAMIN D3) 25 MCG (1000 UNIT) tablet Take 1,000 Units by mouth daily.   feeding supplement, GLUCERNA SHAKE, (GLUCERNA SHAKE) LIQD Take by mouth daily.   FLUoxetine (PROZAC) 20 MG capsule Take 20 mg by mouth daily.   hydrocortisone cream 0.5 % Apply 1 Application topically 2 (two) times daily as needed. apply to face around residents mouth   insulin glargine (LANTUS) 100 UNIT/ML injection Inject 20 Units into  the skin daily.   insulin lispro (HUMALOG) 100 UNIT/ML cartridge Inject 5 Units into the skin in the morning and at bedtime. Give 5 units  with breakfast and lunch give if he eats more than 50% of meals   lansoprazole (PREVACID SOLUTAB) 30 MG disintegrating tablet Take 30 mg by mouth daily. Give before meals   loratadine (CLARITIN) 10 MG tablet Take 10 mg by mouth daily.   losartan (COZAAR) 25 MG tablet Take 25 mg by mouth daily. DX: Hypertensive heart and chronic kidney disease without heart failure, with stage 1 through stage 4 chronic kidney disease, or unspecified chronic kidney disease-per NP note, code updated   Melatonin 1 MG TABS Take 2 tablets by mouth at bedtime.    NON FORMULARY Diet Change: Regular, thin liquids   ondansetron (ZOFRAN-ODT) 4 MG disintegrating tablet Take 4 mg by mouth every 6 (six) hours as needed for nausea or vomiting.   rOPINIRole (REQUIP) 0.25 MG tablet Take 0.25 mg by mouth at bedtime.   No facility-administered encounter medications on file as of 11/15/2022.     SIGNIFICANT DIAGNOSTIC EXAMS  PREVIOUS   03-01-22: right lower extremity venous doppler: no evidence of dvt within visualized vein of right lower extremity  07-27-22: dexa: t score -1.421  NO NEW EXAMS    LABS REVIEWED PREVIOUS:     01-03-22: glucose 71; bun 54; creat 2.15; k+ 4.4;  na++ 138; ca 8.7; GFR 31; liver normal albumin 3.2; hgb a1c 6.2  01-10-22: glucose 89; bun 49; creat 1.90; k+ 4.2; na++ 141; ca 8.7; GFR 36 03-13-22: wbc 8.0; hgb 16.0; hct 51.2; mcv 993.6 plt 148; glucose 156; bun 47; creat 2.50; k+ 4.7; na++ 140; ca 8.7; GFR 26 CRP 6.4; d-dimer 0.41 03-21-22: crp: <0.5 06-27-22: wbc 7.7; hgb 15.4; hct 47.6; mcv 92.2 plt 159; glucose 151; bun 38; creat 2.13; k+ 4.1; na++ 142; ca 8.7; gfr 31; protein 6.1; albumin 2.9; hgb a1c 6.5; chol 103; ldl 65; trig 76; hdl 23   TODAY  09-06-22; urine micro-albumin: 456.2 11-13-22: hgb A1c 7.5    Review of Systems  Reason unable to perform ROS: aphasia.   Physical Exam Constitutional:      General: He is not in acute distress.    Appearance: He is well-developed. He is not diaphoretic.  Neck:     Thyroid: No thyromegaly.  Cardiovascular:     Rate and Rhythm: Normal rate and regular rhythm.     Pulses: Normal pulses.     Heart sounds: Normal heart sounds.  Pulmonary:     Effort: Pulmonary effort is normal. No respiratory distress.     Breath sounds: Normal breath sounds.  Abdominal:     General: Bowel sounds are normal. There is no distension.     Palpations: Abdomen is soft.     Tenderness: There is no abdominal tenderness.  Musculoskeletal:     Cervical back: Neck supple.     Right lower leg: No edema.     Left lower leg: No edema.     Comments: Right hemiplegia   Lymphadenopathy:     Cervical: No cervical adenopathy.  Skin:    General: Skin is warm and dry.  Neurological:     Mental Status: He is alert. Mental status is at baseline.  Psychiatric:        Mood and Affect: Mood normal.      ASSESSMENT/ PLAN:  TODAY:   Type 2 diabetes mellitus with peripheral vascular disease: hgb A1c 7.5 will  continue humalog 5 units with meals twice daily; lantus 20 units nightly will begin rybelsis 3 mg daily     Ok Edwards NP [facility] Adult Medicine  call [phone]

## 2022-11-23 ENCOUNTER — Non-Acute Institutional Stay (SKILLED_NURSING_FACILITY): Payer: Medicare PPO | Admitting: Adult Health

## 2022-11-23 ENCOUNTER — Encounter: Payer: Self-pay | Admitting: Adult Health

## 2022-11-23 DIAGNOSIS — I2510 Atherosclerotic heart disease of native coronary artery without angina pectoris: Secondary | ICD-10-CM

## 2022-11-23 DIAGNOSIS — Z86718 Personal history of other venous thrombosis and embolism: Secondary | ICD-10-CM

## 2022-11-23 DIAGNOSIS — E1159 Type 2 diabetes mellitus with other circulatory complications: Secondary | ICD-10-CM | POA: Diagnosis not present

## 2022-11-23 DIAGNOSIS — I700 Atherosclerosis of aorta: Secondary | ICD-10-CM | POA: Diagnosis not present

## 2022-11-23 DIAGNOSIS — I152 Hypertension secondary to endocrine disorders: Secondary | ICD-10-CM

## 2022-11-23 NOTE — Progress Notes (Signed)
Location:  [place] Room Number: 130 Place of Service:  SNF (31)   CODE STATUS: dnr   No Known Allergies  Chief Complaint  Patient presents with   Medical Management of Chronic Issues                           Aortic atherosclerosis  History of dvt: CAD native heart without angina:  Hypertension associated with type 2 diabetes mellitus    HPI:  He is a 76 year old long term resident of this facility being seen for the management of her chronic illnesses:   Aortic atherosclerosis  History of dvt: CAD native heart without angina:  Hypertension associated with type 2 diabetes mellitus. He does get out of bed daily; will go to the dining room with his wife. There are no reports of uncontrolled pain. Weight is stable; appetite is stable.   Past Medical History:  Diagnosis Date   A-fib [facility])    a. Post-op afib after CABG 08/2012.   Acute gastric ulcer with hemorrhage    Acute respiratory failure with hypoxia (HCC)    Aphasia following cerebral infarction    CAD (coronary artery disease)    a. NSTEMI s/p stent to distal RCA 03/2000. b. Inf MI s/p emergent thrombectomy/stenting mid RCA 10/2000. c. NSTEMI s/p CABGx4 (LIMA-LAD, SVG-OM2, seq SVG-acute marginal and PD) 05/18/33 - course complicated by confusion, post-op AF, L pleural effusion with thoracentesis. d. Normal LV function by echo 08/2012.   Diverticulosis    DM with CKD    Dysphagia following cerebral infarction    Encephalopathy    Extended spectrum beta lactamase (ESBL) resistance    Generalized anxiety disorder    GERD (gastroesophageal reflux disease)    Hemiplegia and hemiparesis following cerebral infarction affecting right dominant side (HCC)    Hiatal hernia    HLD (hyperlipidemia)    HTN (hypertension)    Non-ST elevation (NSTEMI) myocardial infarction [facility])    Nontraumatic intracerebral hemorrhage in hemisphere, subcortical [facility])    Peripheral vascular disease ([place])    a. Carotid dopplers  neg 08/13/12. b. Pre-cabg ABIs - R=0.87 suggesting mild dz, L=1.29 possibly falsely elevated due to calcified vessels.   Pleural effusion    a. L pleural eff after CABG s/p thoracentesis 08/22/12.   Prostate cancer [facility])    Status post radiation treatment.   Prostatic hypertrophy    a. Hx of urinary retention, awaiting TURP.   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 Mild  MR by TEE 08/2012.    Past Surgical History:  Procedure Laterality Date   APPENDECTOMY     BACK SURGERY     CORONARY ARTERY BYPASS GRAFT  08/16/2012   Procedure: CORONARY ARTERY BYPASS GRAFTING (CABG);  Surgeon: Melrose Nakayama, MD;  Location: [place];  Service: Open Heart Surgery;  Laterality: N/A;   IR REPLC GASTRO/COLONIC TUBE PERCUT W/FLUORO  02/13/2019   LEFT HEART CATHETERIZATION WITH CORONARY ANGIOGRAM N/A 08/14/2012   Procedure: LEFT HEART CATHETERIZATION WITH CORONARY ANGIOGRAM;  Surgeon: Peter M Martinique, MD;  Location: [facility] CATH LAB;  Service: Cardiovascular;  Laterality: N/A;    Social History   Socioeconomic History   Marital status: Married    Spouse name: Not on file   Number of children: Not on  file   Years of education: Not on file   Highest education level: Not on file  Occupational History   Occupation: retired   Tobacco Use   Smoking status: Former    Types: Cigarettes    Quit date: 11/12/1972    Years since quitting: 50.0   Smokeless tobacco: Never  Vaping Use   Vaping Use: Never used  Substance and Sexual Activity   Alcohol use: No   Drug use: No   Sexual activity: Not Currently  Other Topics Concern   Not on file  Social History Narrative   He is a long term patient of [facility]    Social Determinants of Health   Financial Resource Strain: Low Risk  (07/01/2019)   Overall Financial Resource Strain (CARDIA)    Difficulty of Paying Living Expenses: Not hard at all   Food Insecurity: Unknown (07/01/2019)   Hunger Vital Sign    Worried About [place] in the Last Year: Patient refused    [place] in the Last Year: Patient refused  Transportation Needs: Unknown (07/01/2019)   PRAPARE - Transportation    Lack of Transportation (Medical): Patient refused    Lack of Transportation (Non-Medical): Patient refused  Physical Activity: Inactive (10/30/2019)   Exercise Vital Sign    Days of Exercise per Week: 0 days    Minutes of Exercise per Session: 0 min  Stress: Unknown (07/01/2019)   [place] of Stress : Patient refused  Social Connections: Unknown (07/01/2019)   Social Connection and Isolation Panel [NHANES]    Frequency of Communication with Friends and Family: Patient refused    Frequency of Social Gatherings with Friends and Family: Patient refused    Attends Religious Services: Patient refused    Active Member of Clubs or Organizations: Patient refused    Attends Archivist Meetings: Patient refused    Marital Status: Patient refused  Intimate Partner Violence: Unknown (07/01/2019)   Humiliation, Afraid, Rape, and Kick questionnaire    Fear of Current or Ex-Partner: Patient refused    Emotionally Abused: Patient refused    Physically Abused: Patient refused    Sexually Abused: Patient refused   Family History  Problem Relation Age of Onset   Hypertension Mother       VITAL SIGNS BP 128/64   Pulse 65   Temp 98 F (36.7 C)   Resp 18   Ht '5\' 11"'$  (1.803 m)   Wt 178 lb (80.7 kg)   SpO2 96%   BMI 24.83 kg/m   Outpatient Encounter Medications as of 11/23/2022  Medication Sig   acetaminophen (TYLENOL) 325 MG tablet Take 650 mg by mouth every 4 (four) hours as needed. per standing order for pain/fever DO NOT EXCEED >3,000 mg in 24 hours   amLODipine (NORVASC) 5 MG tablet Take 5 mg by mouth daily.    atorvastatin (LIPITOR) 40 MG tablet  Take 40 mg by mouth daily.   baclofen (LIORESAL) 10 MG tablet Take 10 mg by mouth 3 (three) times daily.   Balsam [place]-Castor Oil (VENELEX) OINT Apply topically. Special Instructions: Apply to sacrum, coccyx and bilateral buttocks qshift for prevention/ redness. Every Shift Day, Evening, Night   BD AUTOSHIELD DUO 30G X 5 MM MISC 3/16"   calcium carbonate (OSCAL) 1500 (600 Ca) MG TABS tablet Take 600 mg of elemental calcium by mouth daily with breakfast.   cholecalciferol (VITAMIN D3) 25 MCG (1000  UNIT) tablet Take 1,000 Units by mouth daily.   feeding supplement, GLUCERNA SHAKE, (GLUCERNA SHAKE) LIQD Take by mouth daily.   FLUoxetine (PROZAC) 20 MG capsule Take 20 mg by mouth daily.   hydrocortisone cream 0.5 % Apply 1 Application topically 2 (two) times daily as needed. apply to face around residents mouth   insulin glargine (LANTUS) 100 UNIT/ML injection Inject 20 Units into the skin daily.   insulin lispro (HUMALOG) 100 UNIT/ML cartridge Inject 5 Units into the skin in the morning and at bedtime. Give 5 units  with breakfast and lunch give if he eats more than 50% of meals   lansoprazole (PREVACID SOLUTAB) 30 MG disintegrating tablet Take 30 mg by mouth daily. Give before meals   loratadine (CLARITIN) 10 MG tablet Take 10 mg by mouth daily.   losartan (COZAAR) 25 MG tablet Take 25 mg by mouth daily. DX: Hypertensive heart and chronic kidney disease without heart failure, with stage 1 through stage 4 chronic kidney disease, or unspecified chronic kidney disease-per NP note, code updated   Melatonin 1 MG TABS Take 2 tablets by mouth at bedtime.    NON FORMULARY Diet Change: Regular, thin liquids   ondansetron (ZOFRAN-ODT) 4 MG disintegrating tablet Take 4 mg by mouth every 6 (six) hours as needed for nausea or vomiting.   rOPINIRole (REQUIP) 0.25 MG tablet Take 0.25 mg by mouth at bedtime.   No facility-administered encounter medications on file as of 11/23/2022.     SIGNIFICANT DIAGNOSTIC  EXAMS  PREVIOUS   03-01-22: right lower extremity venous doppler: no evidence of dvt within visualized vein of right lower extremity  07-27-22: dexa: t score -1.421  NO NEW EXAMS    LABS REVIEWED PREVIOUS:     01-03-22: glucose 71; bun 54; creat 2.15; k+ 4.4; na++ 138; ca 8.7; GFR 31; liver normal albumin 3.2; hgb a1c 6.2  01-10-22: glucose 89; bun 49; creat 1.90; k+ 4.2; na++ 141; ca 8.7; GFR 36 03-13-22: wbc 8.0; hgb 16.0; hct 51.2; mcv 993.6 plt 148; glucose 156; bun 47; creat 2.50; k+ 4.7; na++ 140; ca 8.7; GFR 26 CRP 6.4; d-dimer 0.41 03-21-22: crp: <0.5 06-27-22: wbc 7.7; hgb 15.4; hct 47.6; mcv 92.2 plt 159; glucose 151; bun 38; creat 2.13; k+ 4.1; na++ 142; ca 8.7; gfr 31; protein 6.1; albumin 2.9; hgb a1c 6.5; chol 103; ldl 65; trig 76; hdl 23  09-06-22; urine micro-albumin: 456.2 11-13-22: hgb A1c 7.5  NO NEW LABS.     Review of Systems  Reason unable to perform ROS: aphasia.   Physical Exam Constitutional:      General: He is not in acute distress.    Appearance: He is well-developed. He is not diaphoretic.  Neck:     Thyroid: No thyromegaly.  Cardiovascular:     Rate and Rhythm: Normal rate and regular rhythm.     Pulses: Normal pulses.     Heart sounds: Normal heart sounds.  Pulmonary:     Effort: Pulmonary effort is normal. No respiratory distress.     Breath sounds: Normal breath sounds.  Abdominal:     General: Bowel sounds are normal. There is no distension.     Palpations: Abdomen is soft.     Tenderness: There is no abdominal tenderness.  Musculoskeletal:     Cervical back: Neck supple.     Comments: Right hemiplegia  Lymphadenopathy:     Cervical: No cervical adenopathy.  Skin:    General: Skin is warm and dry.  Neurological:  Mental Status: He is alert. Mental status is at baseline.  Psychiatric:        Mood and Affect: Mood normal.      ASSESSMENT/ PLAN:  TODAY:   Aortic atherosclerosis (ct 01-04-19) is on statin  2. History of dvt: has  complete eliquis therapy  3. CAD native heart without angina: s/p cabg 2012; will continue cozaar 25 mg daily   4. Hypertension associated with type 2 diabetes mellitus: b/p 128/64 norvasc 5 mg daily and cozaar 25 mg daily   PREVIOUS   5. Osteopenia: t score -1.421 is on calcium and vitamin D supplements   6. Dyslipidemia associated with type 2 diabetes mellitus: ldl 65 will continue lipitor 40 mg daily   7. Micro-albuminuria due to type 2 diabetes mellitus: 456.2  is on ARB  8. Major depression with psychotic features: he is taking prozac 20 mg daily   9. Chronic urine retention/bladder spasms: foley is out; is not on medications.  10. GERD without esophagitis: will continue prvacid 30 mg daily   11. Chronic non-seasonal allergic rhinitis: will continue claritin 10 mg daily    12. CKD stage 3 due to type 2 diabetes mellitus: bun 38; creat 2.13; gfr 31   13. Type 2 diabetes mellitus with peripheral vascular disease: hgb A1c 6.5 will continue humalog 5 units with meals twice daily lantus 20 units nightly is on statin arb  14. Protein calorie malnutrition severe weight is 178 pounds; albumin 2.9 will continue supplements as directed  15. History of nontraumatic subcortical hemorrhage left cerebral hemisphere/right spastic hemiplegia: 05-22-21 will continue baclofen 10 mg three times daily   16. Restless leg syndrome: will continue reqiup 0.25 mg daily      Ok Edwards NP [facility] Adult Medicine  call [phone]

## 2022-11-29 ENCOUNTER — Ambulatory Visit: Payer: Medicare PPO | Admitting: Urology

## 2022-12-08 ENCOUNTER — Ambulatory Visit (HOSPITAL_COMMUNITY)
Admission: RE | Admit: 2022-12-08 | Discharge: 2022-12-08 | Disposition: A | Discharge status: Home / Self Care | Payer: Medicare PPO | Source: Ambulatory Visit | Attending: Adult Health | Admitting: Adult Health

## 2022-12-08 ENCOUNTER — Encounter: Payer: Self-pay | Admitting: Adult Health

## 2022-12-08 ENCOUNTER — Non-Acute Institutional Stay (SKILLED_NURSING_FACILITY): Payer: Medicare PPO | Admitting: Adult Health

## 2022-12-08 DIAGNOSIS — F0150 Vascular dementia without behavioral disturbance: Secondary | ICD-10-CM

## 2022-12-08 DIAGNOSIS — I6930 Unspecified sequelae of cerebral infarction: Secondary | ICD-10-CM | POA: Diagnosis not present

## 2022-12-08 DIAGNOSIS — I69390 Apraxia following cerebral infarction: Secondary | ICD-10-CM

## 2022-12-08 DIAGNOSIS — R4182 Altered mental status, unspecified: Secondary | ICD-10-CM | POA: Diagnosis not present

## 2022-12-08 DIAGNOSIS — I639 Cerebral infarction, unspecified: Secondary | ICD-10-CM | POA: Insufficient documentation

## 2022-12-08 NOTE — Progress Notes (Signed)
Location:  [place] Room Number: 130 P Place of Service:  SNF (31) Provider:  Ok Edwards, NP  CODE STATUS: DNR  No Known Allergies  Chief Complaint  Patient presents with   Acute Visit    Altered mental status    HPI:  His wife has noted that he is having increased weakness; unable to hold a cup in his unaffected side. He is sleeping more. She is concerned that he may have had another stroke. We did discuss that this could represent a progression of his disease state. With his history of cva in the past and his history of dementia.   Past Medical History:  Diagnosis Date   A-fib [facility])    a. Post-op afib after CABG 08/2012.   Acute gastric ulcer with hemorrhage    Acute respiratory failure with hypoxia (HCC)    Aphasia following cerebral infarction    CAD (coronary artery disease)    a. NSTEMI s/p stent to distal RCA 03/2000. b. Inf MI s/p emergent thrombectomy/stenting mid RCA 10/2000. c. NSTEMI s/p CABGx4 (LIMA-LAD, SVG-OM2, seq SVG-acute marginal and PD) 0/9/73 - course complicated by confusion, post-op AF, L pleural effusion with thoracentesis. d. Normal LV function by echo 08/2012.   Diverticulosis    DM with CKD    Dysphagia following cerebral infarction    Encephalopathy    Extended spectrum beta lactamase (ESBL) resistance    Generalized anxiety disorder    GERD (gastroesophageal reflux disease)    Hemiplegia and hemiparesis following cerebral infarction affecting right dominant side (HCC)    Hiatal hernia    HLD (hyperlipidemia)    HTN (hypertension)    Non-ST elevation (NSTEMI) myocardial infarction [facility])    Nontraumatic intracerebral hemorrhage in hemisphere, subcortical [facility])    Peripheral vascular disease ([place])    a. Carotid dopplers neg 08/13/12. b. Pre-cabg ABIs - R=0.87 suggesting mild dz, L=1.29 possibly falsely elevated due to calcified vessels.   Pleural effusion    a. L pleural eff after CABG s/p thoracentesis 08/22/12.    Prostate cancer [facility])    Status post radiation treatment.   Prostatic hypertrophy    a. Hx of urinary retention, awaiting TURP.   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 Mild  MR by TEE 08/2012.    Past Surgical History:  Procedure Laterality Date   APPENDECTOMY     BACK SURGERY     CORONARY ARTERY BYPASS GRAFT  08/16/2012   Procedure: CORONARY ARTERY BYPASS GRAFTING (CABG);  Surgeon: Melrose Nakayama, MD;  Location: [place];  Service: Open Heart Surgery;  Laterality: N/A;   IR REPLC GASTRO/COLONIC TUBE PERCUT W/FLUORO  02/13/2019   LEFT HEART CATHETERIZATION WITH CORONARY ANGIOGRAM N/A 08/14/2012   Procedure: LEFT HEART CATHETERIZATION WITH CORONARY ANGIOGRAM;  Surgeon: Peter M Martinique, MD;  Location: [facility] CATH LAB;  Service: Cardiovascular;  Laterality: N/A;    Social History   Socioeconomic History   Marital status: Married    Spouse name: Not on file   Number of children: Not on file   Years of education: Not on file   Highest education level: Not on file  Occupational History   Occupation: retired   Tobacco Use   Smoking status: Former    Types: Cigarettes    Quit date: 11/12/1972    Years since quitting: 50.1  Smokeless tobacco: Never  Vaping Use   Vaping Use: Never used  Substance and Sexual Activity   Alcohol use: No   Drug use: No   Sexual activity: Not Currently  Other Topics Concern   Not on file  Social History Narrative   He is a long term patient of [place]    Social Determinants of Health   Financial Resource Strain: Low Risk  (07/01/2019)   Overall Financial Resource Strain (CARDIA)    Difficulty of Paying Living Expenses: Not hard at all  Food Insecurity: Unknown (07/01/2019)   Hunger Vital Sign    Worried About [place] in the Last Year: Patient refused    [place] in the Last Year: Patient refused  Transportation  Needs: Unknown (07/01/2019)   PRAPARE - Transportation    Lack of Transportation (Medical): Patient refused    Lack of Transportation (Non-Medical): Patient refused  Physical Activity: Inactive (10/30/2019)   Exercise Vital Sign    Days of Exercise per Week: 0 days    Minutes of Exercise per Session: 0 min  Stress: Unknown (07/01/2019)   [place] of Stress : Patient refused  Social Connections: Unknown (07/01/2019)   Social Connection and Isolation Panel [NHANES]    Frequency of Communication with Friends and Family: Patient refused    Frequency of Social Gatherings with Friends and Family: Patient refused    Attends Religious Services: Patient refused    Active Member of Clubs or Organizations: Patient refused    Attends Archivist Meetings: Patient refused    Marital Status: Patient refused  Intimate Partner Violence: Unknown (07/01/2019)   Humiliation, Afraid, Rape, and Kick questionnaire    Fear of Current or Ex-Partner: Patient refused    Emotionally Abused: Patient refused    Physically Abused: Patient refused    Sexually Abused: Patient refused   Family History  Problem Relation Age of Onset   Hypertension Mother       VITAL SIGNS BP 132/82   Pulse 77   Temp 98 F (36.7 C)   Resp 20   Ht '5\' 11"'$  (1.803 m)   Wt 178 lb (80.7 kg)   SpO2 94%   BMI 24.83 kg/m   Outpatient Encounter Medications as of 12/08/2022  Medication Sig   acetaminophen (TYLENOL) 325 MG tablet Take 650 mg by mouth every 4 (four) hours as needed. per standing order for pain/fever DO NOT EXCEED >3,000 mg in 24 hours   amLODipine (NORVASC) 5 MG tablet Take 5 mg by mouth daily.    atorvastatin (LIPITOR) 40 MG tablet Take 40 mg by mouth daily.   baclofen (LIORESAL) 10 MG tablet Take 10 mg by mouth 3 (three) times daily.   Balsam [place]-Castor Oil (VENELEX) OINT Apply topically. Special Instructions: Apply to sacrum,  coccyx and bilateral buttocks qshift for prevention/ redness. Every Shift Day, Evening, Night   BD AUTOSHIELD DUO 30G X 5 MM MISC 3/16"   calcium carbonate (OSCAL) 1500 (600 Ca) MG TABS tablet Take 600 mg of elemental calcium by mouth daily with breakfast.   cholecalciferol (VITAMIN D3) 25 MCG (1000 UNIT) tablet Take 1,000 Units by mouth daily.   feeding supplement, GLUCERNA SHAKE, (GLUCERNA SHAKE) LIQD Take by mouth daily.   FLUoxetine (PROZAC) 20 MG capsule Take 20 mg by mouth daily.   hydrocortisone cream 0.5 % Apply 1 Application topically 2 (two) times daily as needed. apply to face  around residents mouth   insulin glargine (LANTUS) 100 UNIT/ML injection Inject 20 Units into the skin daily.   insulin lispro (HUMALOG) 100 UNIT/ML cartridge Inject 5 Units into the skin in the morning and at bedtime. Give 5 units  with breakfast and lunch give if he eats more than 50% of meals   lansoprazole (PREVACID SOLUTAB) 30 MG disintegrating tablet Take 30 mg by mouth daily. Give before meals   loratadine (CLARITIN) 10 MG tablet Take 10 mg by mouth daily.   losartan (COZAAR) 25 MG tablet Take 25 mg by mouth daily. DX: Hypertensive heart and chronic kidney disease without heart failure, with stage 1 through stage 4 chronic kidney disease, or unspecified chronic kidney disease-per NP note, code updated   Melatonin 1 MG TABS Take 2 tablets by mouth at bedtime.    NON FORMULARY Diet Change: Regular, thin liquids   ondansetron (ZOFRAN-ODT) 4 MG disintegrating tablet Take 4 mg by mouth every 6 (six) hours as needed for nausea or vomiting.   rOPINIRole (REQUIP) 0.25 MG tablet Take 0.25 mg by mouth at bedtime.   No facility-administered encounter medications on file as of 12/08/2022.     SIGNIFICANT DIAGNOSTIC EXAMS  PREVIOUS   03-01-22: right lower extremity venous doppler: no evidence of dvt within visualized vein of right lower extremity  07-27-22: dexa: t score -1.421  TODAY  12-08-22: ct of head:   1. No acute intracranial abnormality. 2. Unchanged encephalomalacia in the left basal ganglia with prominent low-density changes in the left periventricular and subcortical white matter.    LABS REVIEWED PREVIOUS:     01-03-22: glucose 71; bun 54; creat 2.15; k+ 4.4; na++ 138; ca 8.7; GFR 31; liver normal albumin 3.2; hgb a1c 6.2  01-10-22: glucose 89; bun 49; creat 1.90; k+ 4.2; na++ 141; ca 8.7; GFR 36 03-13-22: wbc 8.0; hgb 16.0; hct 51.2; mcv 993.6 plt 148; glucose 156; bun 47; creat 2.50; k+ 4.7; na++ 140; ca 8.7; GFR 26 CRP 6.4; d-dimer 0.41 03-21-22: crp: <0.5 06-27-22: wbc 7.7; hgb 15.4; hct 47.6; mcv 92.2 plt 159; glucose 151; bun 38; creat 2.13; k+ 4.1; na++ 142; ca 8.7; gfr 31; protein 6.1; albumin 2.9; hgb a1c 6.5; chol 103; ldl 65; trig 76; hdl 23  09-06-22; urine micro-albumin: 456.2 11-13-22: hgb A1c 7.5  NO NEW LABS.     Review of Systems  Reason unable to perform ROS: aphasia.    Physical Exam Constitutional:      General: He is not in acute distress.    Appearance: He is well-developed. He is not diaphoretic.     Comments: Is lethargic   Neck:     Thyroid: No thyromegaly.  Cardiovascular:     Rate and Rhythm: Normal rate and regular rhythm.     Pulses: Normal pulses.     Heart sounds: Normal heart sounds.  Pulmonary:     Effort: Pulmonary effort is normal. No respiratory distress.     Breath sounds: Normal breath sounds.  Abdominal:     General: Bowel sounds are normal. There is no distension.     Palpations: Abdomen is soft.     Tenderness: There is no abdominal tenderness.  Musculoskeletal:     Cervical back: Neck supple.     Right lower leg: No edema.     Left lower leg: No edema.     Comments: Right hemiplegia Left upper and lower extremities are weaker   Lymphadenopathy:     Cervical: No cervical adenopathy.  Skin:  General: Skin is warm and dry.  Neurological:     Mental Status: He is alert. Mental status is at baseline.  Psychiatric:         Mood and Affect: Mood normal.      ASSESSMENT/ PLAN:  TODAY  History of hemorrhagic cerebrovascular accident (CVA) with residual affect Vascular dementia without behavioral disturbance  His ct scan does not demonstrate any acute changes Will not make further changes at this time    Ok Edwards NP [facility] Adult Medicine  call [phone]

## 2022-12-09 ENCOUNTER — Ambulatory Visit (HOSPITAL_COMMUNITY): Admit: 2022-12-09 | Payer: Medicare PPO

## 2022-12-16 ENCOUNTER — Non-Acute Institutional Stay (SKILLED_NURSING_FACILITY): Payer: Medicare PPO | Admitting: Internal Medicine

## 2022-12-16 ENCOUNTER — Encounter: Payer: Self-pay | Admitting: Internal Medicine

## 2022-12-16 DIAGNOSIS — G8111 Spastic hemiplegia affecting right dominant side: Secondary | ICD-10-CM | POA: Diagnosis not present

## 2022-12-16 DIAGNOSIS — F0150 Vascular dementia without behavioral disturbance: Secondary | ICD-10-CM | POA: Insufficient documentation

## 2022-12-16 DIAGNOSIS — E1159 Type 2 diabetes mellitus with other circulatory complications: Secondary | ICD-10-CM

## 2022-12-16 DIAGNOSIS — I152 Hypertension secondary to endocrine disorders: Secondary | ICD-10-CM | POA: Diagnosis not present

## 2022-12-16 DIAGNOSIS — N183 Chronic kidney disease, stage 3 unspecified: Secondary | ICD-10-CM | POA: Diagnosis not present

## 2022-12-16 DIAGNOSIS — E1122 Type 2 diabetes mellitus with diabetic chronic kidney disease: Secondary | ICD-10-CM | POA: Diagnosis not present

## 2022-12-16 DIAGNOSIS — E1151 Type 2 diabetes mellitus with diabetic peripheral angiopathy without gangrene: Secondary | ICD-10-CM | POA: Diagnosis not present

## 2022-12-16 NOTE — Assessment & Plan Note (Signed)
Microalbumin/creat ratio 480 (0-29). Consider D/Cing ARB if CKD progresses to ESRD

## 2022-12-16 NOTE — Progress Notes (Unsigned)
   NURSING HOME LOCATION:  [facility] ROOM NUMBER:  130 P  CODE STATUS:  DNR  PCP:  Ok Edwards NP  This is a nursing facility follow up visit of chronic medical diagnoses & to document compliance with Regulation 483.30 (c) in [employer] Manual Phase 2 which mandates caregiver visit ( visits can alternate among physician, PA or NP as per statutes) within 10 days of 30 days / 60 days/ 90 days post admission to SNF date    Interim medical record and care since last SNF visit was updated with review of diagnostic studies and change in clinical status since last visit were documented.  HPI:  Review of systems: Dementia invalidated responses. Date given as   Constitutional: No fever, significant weight change, fatigue  Eyes: No redness, discharge, pain, vision change ENT/mouth: No nasal congestion,  purulent discharge, earache, change in hearing, sore throat  Cardiovascular: No chest pain, palpitations, paroxysmal nocturnal dyspnea, claudication, edema  Respiratory: No cough, sputum production, hemoptysis, DOE, significant snoring, apnea   Gastrointestinal: No heartburn, dysphagia, abdominal pain, nausea /vomiting, rectal bleeding, melena, change in bowels Genitourinary: No dysuria, hematuria, pyuria, incontinence, nocturia Musculoskeletal: No joint stiffness, joint swelling, weakness, pain Dermatologic: No rash, pruritus, change in appearance of skin Neurologic: No dizziness, headache, syncope, seizures, numbness, tingling Psychiatric: No significant anxiety, depression, insomnia, anorexia Endocrine: No change in hair/skin/nails, excessive thirst, excessive hunger, excessive urination  Hematologic/lymphatic: No significant bruising, lymphadenopathy, abnormal bleeding Allergy/immunology: No itchy/watery eyes, significant sneezing, urticaria, angioedema  Physical exam:  Pertinent or positive findings: General appearance: Adequately nourished; no acute  distress, increased work of breathing is present.   Lymphatic: No lymphadenopathy about the head, neck, axilla. Eyes: No conjunctival inflammation or lid edema is present. There is no scleral icterus. Ears:  External ear exam shows no significant lesions or deformities.   Nose:  External nasal examination shows no deformity or inflammation. Nasal mucosa are pink and moist without lesions, exudates Oral exam:  Lips and gums are healthy appearing. There is no oropharyngeal erythema or exudate. Neck:  No thyromegaly, masses, tenderness noted.    Heart:  Normal rate and regular rhythm. S1 and S2 normal without gallop, murmur, click, rub .  Lungs: Chest clear to auscultation without wheezes, rhonchi, rales, rubs. Abdomen: Bowel sounds are normal. Abdomen is soft and nontender with no organomegaly, hernias, masses. GU: Deferred  Extremities:  No cyanosis, clubbing, edema  Neurologic exam : Cn 2-7 intact Strength equal  in upper & lower extremities Balance, Rhomberg, finger to nose testing could not be completed due to clinical state Deep tendon reflexes are equal Skin: Warm & dry w/o tenting. No significant lesions or rash.  See summary under each active problem in the Problem List with associated updated therapeutic plan

## 2022-12-16 NOTE — Patient Instructions (Signed)
See assessment and plan under each diagnosis in the problem list and acutely for this visit 

## 2022-12-16 NOTE — Assessment & Plan Note (Signed)
Current A1c 7.5% which is adequate control with advanced co-morbidities

## 2022-12-16 NOTE — Assessment & Plan Note (Signed)
BP controlled; no change in antihypertensive medications  

## 2022-12-17 NOTE — Assessment & Plan Note (Signed)
He could provide no meaningful history and had difficulty following simple commands.

## 2022-12-17 NOTE — Assessment & Plan Note (Signed)
There has been no change in the right hemiparesis.  The right hand is in a brace.

## 2022-12-19 DIAGNOSIS — M2041 Other hammer toe(s) (acquired), right foot: Secondary | ICD-10-CM | POA: Diagnosis not present

## 2022-12-19 DIAGNOSIS — E1140 Type 2 diabetes mellitus with diabetic neuropathy, unspecified: Secondary | ICD-10-CM | POA: Diagnosis not present

## 2022-12-19 DIAGNOSIS — B351 Tinea unguium: Secondary | ICD-10-CM | POA: Diagnosis not present

## 2022-12-19 DIAGNOSIS — M2042 Other hammer toe(s) (acquired), left foot: Secondary | ICD-10-CM | POA: Diagnosis not present

## 2022-12-29 ENCOUNTER — Encounter: Payer: Self-pay | Admitting: Adult Health

## 2022-12-29 ENCOUNTER — Non-Acute Institutional Stay (SKILLED_NURSING_FACILITY): Payer: Medicare PPO | Admitting: Adult Health

## 2022-12-29 DIAGNOSIS — Z0000 Encounter for general adult medical examination without abnormal findings: Secondary | ICD-10-CM | POA: Diagnosis not present

## 2022-12-29 NOTE — Progress Notes (Unsigned)
Location:  [place] Room Number: 301-S Place of Service:  SNF (31)   CODE STATUS: DNR  No Known Allergies  Chief Complaint  Patient presents with   Medicare Wellness    HPI:    Past Medical History:  Diagnosis Date   A-fib ([place])    a. Post-op afib after CABG 08/2012.   Acute gastric ulcer with hemorrhage    Acute respiratory failure with hypoxia (HCC)    Aphasia following cerebral infarction    CAD (coronary artery disease)    a. NSTEMI s/p stent to distal RCA 03/2000. b. Inf MI s/p emergent thrombectomy/stenting mid RCA 10/2000. c. NSTEMI s/p CABGx4 (LIMA-LAD, SVG-OM2, seq SVG-acute marginal and PD) 0/1/09 - course complicated by confusion, post-op AF, L pleural effusion with thoracentesis. d. Normal LV function by echo 08/2012.   Diverticulosis    DM with CKD    Dysphagia following cerebral infarction    Encephalopathy    Extended spectrum beta lactamase (ESBL) resistance    Generalized anxiety disorder    GERD (gastroesophageal reflux disease)    Hemiplegia and hemiparesis following cerebral infarction affecting right dominant side (HCC)    Hiatal hernia    HLD (hyperlipidemia)    HTN (hypertension)    Non-ST elevation (NSTEMI) myocardial infarction [facility])    Nontraumatic intracerebral hemorrhage in hemisphere, subcortical [facility])    Peripheral vascular disease ([place])    a. Carotid dopplers neg 08/13/12. b. Pre-cabg ABIs - R=0.87 suggesting mild dz, L=1.29 possibly falsely elevated due to calcified vessels.   Pleural effusion    a. L pleural eff after CABG s/p thoracentesis 08/22/12.   Prostate cancer [facility])    Status post radiation treatment.   Prostatic hypertrophy    a. Hx of urinary retention, awaiting TURP.   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 Mild  MR by TEE 08/2012.    Past Surgical History:  Procedure  Laterality Date   APPENDECTOMY     BACK SURGERY     CORONARY ARTERY BYPASS GRAFT  08/16/2012   Procedure: CORONARY ARTERY BYPASS GRAFTING (CABG);  Surgeon: Melrose Nakayama, MD;  Location: [place];  Service: Open Heart Surgery;  Laterality: N/A;   IR REPLC GASTRO/COLONIC TUBE PERCUT W/FLUORO  02/13/2019   LEFT HEART CATHETERIZATION WITH CORONARY ANGIOGRAM N/A 08/14/2012   Procedure: LEFT HEART CATHETERIZATION WITH CORONARY ANGIOGRAM;  Surgeon: Peter M Martinique, MD;  Location: [facility] CATH LAB;  Service: Cardiovascular;  Laterality: N/A;    Social History   Socioeconomic History   Marital status: Married    Spouse name: Not on file   Number of children: Not on file   Years of education: Not on file   Highest education level: Not on file  Occupational History   Occupation: retired   Tobacco Use   Smoking status: Former    Types: Cigarettes    Quit date: 11/12/1972    Years since quitting: 50.1   Smokeless tobacco: Never  Vaping Use   Vaping Use: Never used  Substance and Sexual Activity   Alcohol use: No   Drug use: No   Sexual activity: Not Currently  Other Topics Concern   Not on file  Social History Narrative   He is a long term patient of [facility]    Social Determinants of Health   Financial Resource Strain:  Low Risk  (07/01/2019)   Overall Financial Resource Strain (CARDIA)    Difficulty of Paying Living Expenses: Not hard at all  Food Insecurity: Unknown (07/01/2019)   Hunger Vital Sign    Worried About Running Out of Food in the Last Year: Patient refused    [place] in the Last Year: Patient refused  Transportation Needs: Unknown (07/01/2019)   PRAPARE - Transportation    Lack of Transportation (Medical): Patient refused    Lack of Transportation (Non-Medical): Patient refused  Physical Activity: Inactive (10/30/2019)   Exercise Vital Sign    Days of Exercise per Week: 0 days    Minutes of Exercise per Session: 0 min  Stress: Unknown (07/01/2019)   [place]    Feeling of Stress : Patient refused  Social Connections: Unknown (07/01/2019)   Social Connection and Isolation Panel [NHANES]    Frequency of Communication with Friends and Family: Patient refused    Frequency of Social Gatherings with Friends and Family: Patient refused    Attends Religious Services: Patient refused    Active Member of Clubs or Organizations: Patient refused    Attends Archivist Meetings: Patient refused    Marital Status: Patient refused  Intimate Partner Violence: Unknown (07/01/2019)   Humiliation, Afraid, Rape, and Kick questionnaire    Fear of Current or Ex-Partner: Patient refused    Emotionally Abused: Patient refused    Physically Abused: Patient refused    Sexually Abused: Patient refused   Family History  Problem Relation Age of Onset   Hypertension Mother       VITAL SIGNS BP 134/77   Pulse 77   Temp (!) 97 F (36.1 C)   Resp 18   Ht '5\' 11"'$  (1.803 m)   Wt 178 lb 8 oz (81 kg)   SpO2 95%   BMI 24.90 kg/m   Outpatient Encounter Medications as of 12/29/2022  Medication Sig   acetaminophen (TYLENOL) 325 MG tablet Take 650 mg by mouth every 4 (four) hours as needed. per standing order for pain/fever DO NOT EXCEED >3,000 mg in 24 hours   amLODipine (NORVASC) 5 MG tablet Take 5 mg by mouth daily.    atorvastatin (LIPITOR) 40 MG tablet Take 40 mg by mouth daily.   baclofen (LIORESAL) 10 MG tablet Take 10 mg by mouth 3 (three) times daily.   Balsam [place]-Castor Oil (VENELEX) OINT Apply topically. Special Instructions: Apply to sacrum, coccyx and bilateral buttocks qshift for prevention/ redness. Every Shift Day, Evening, Night   BD AUTOSHIELD DUO 30G X 5 MM MISC 3/16"   calcium carbonate (OSCAL) 1500 (600 Ca) MG TABS tablet Take 600 mg of elemental calcium by mouth daily with breakfast.   cholecalciferol (VITAMIN D3) 25 MCG (1000 UNIT) tablet Take 1,000 Units by mouth daily.   feeding  supplement, GLUCERNA SHAKE, (GLUCERNA SHAKE) LIQD Take by mouth daily.   FLUoxetine (PROZAC) 20 MG capsule Take 20 mg by mouth daily.   hydrocortisone cream 0.5 % Apply 1 Application topically 2 (two) times daily as needed. apply to face around residents mouth   insulin glargine (LANTUS) 100 UNIT/ML injection Inject 20 Units into the skin daily.   insulin lispro (HUMALOG) 100 UNIT/ML cartridge Inject 5 Units into the skin in the morning and at bedtime. Give 5 units  with breakfast and lunch give if he eats more than 50% of meals   lansoprazole (PREVACID SOLUTAB) 30 MG disintegrating tablet Take  30 mg by mouth daily. Give before meals   loratadine (CLARITIN) 10 MG tablet Take 10 mg by mouth daily.   losartan (COZAAR) 25 MG tablet Take 25 mg by mouth daily. DX: Hypertensive heart and chronic kidney disease without heart failure, with stage 1 through stage 4 chronic kidney disease, or unspecified chronic kidney disease-per NP note, code updated   Melatonin 1 MG TABS Take 2 tablets by mouth at bedtime.    NON FORMULARY Diet Change: Regular, thin liquids   ondansetron (ZOFRAN-ODT) 4 MG disintegrating tablet Take 4 mg by mouth every 6 (six) hours as needed for nausea or vomiting.   rOPINIRole (REQUIP) 0.25 MG tablet Take 0.25 mg by mouth at bedtime.   No facility-administered encounter medications on file as of 12/29/2022.     SIGNIFICANT DIAGNOSTIC EXAMS       ASSESSMENT/ PLAN:     Ok Edwards NP [facility]  Contact [phone] Monday through Friday 8am- 5pm  After hours call [phone]

## 2022-12-31 IMAGING — CR DG ABDOMEN 1V
2 series · 2 of 2 positions shown · non-contrast
Comparison: 02/12/2019, 02/11/2019

CLINICAL DATA: Constipation

EXAM:
ABDOMEN - 1 VIEW

[x abdomen supine (1 of 2)]
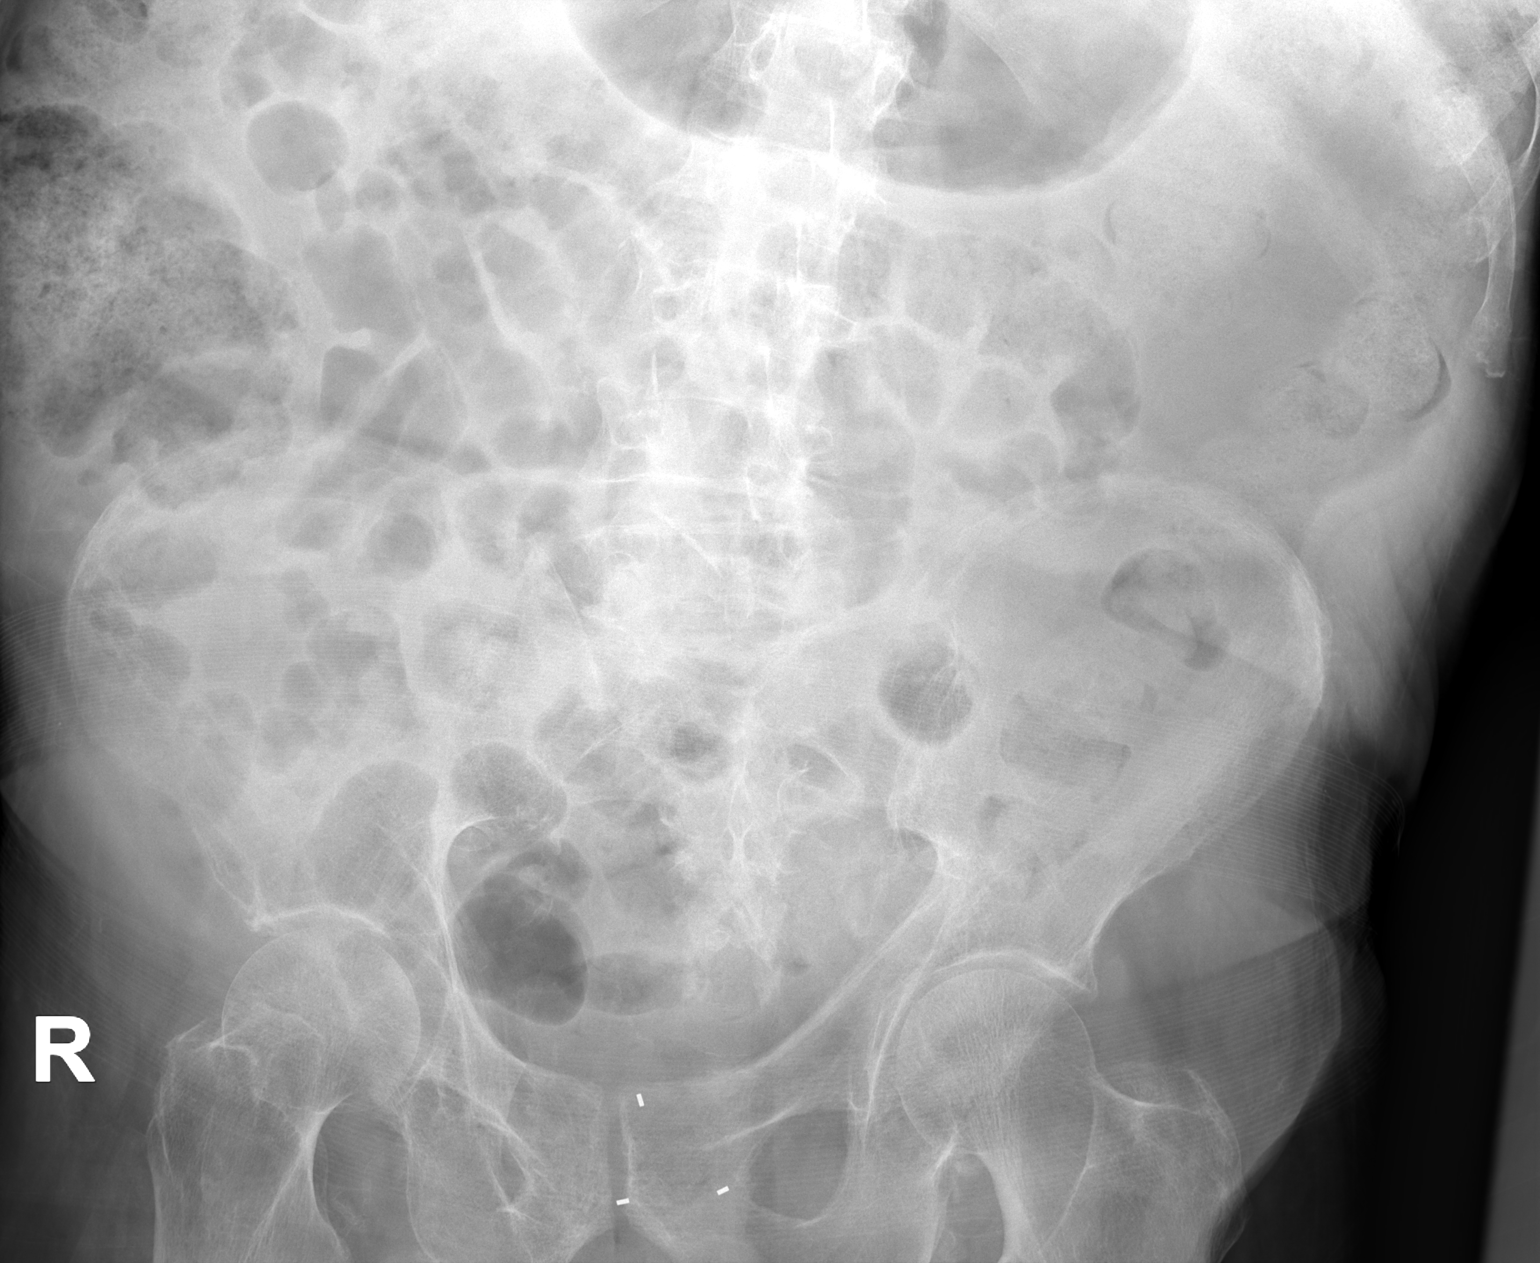

[x abdomen supine (2 of 2)]
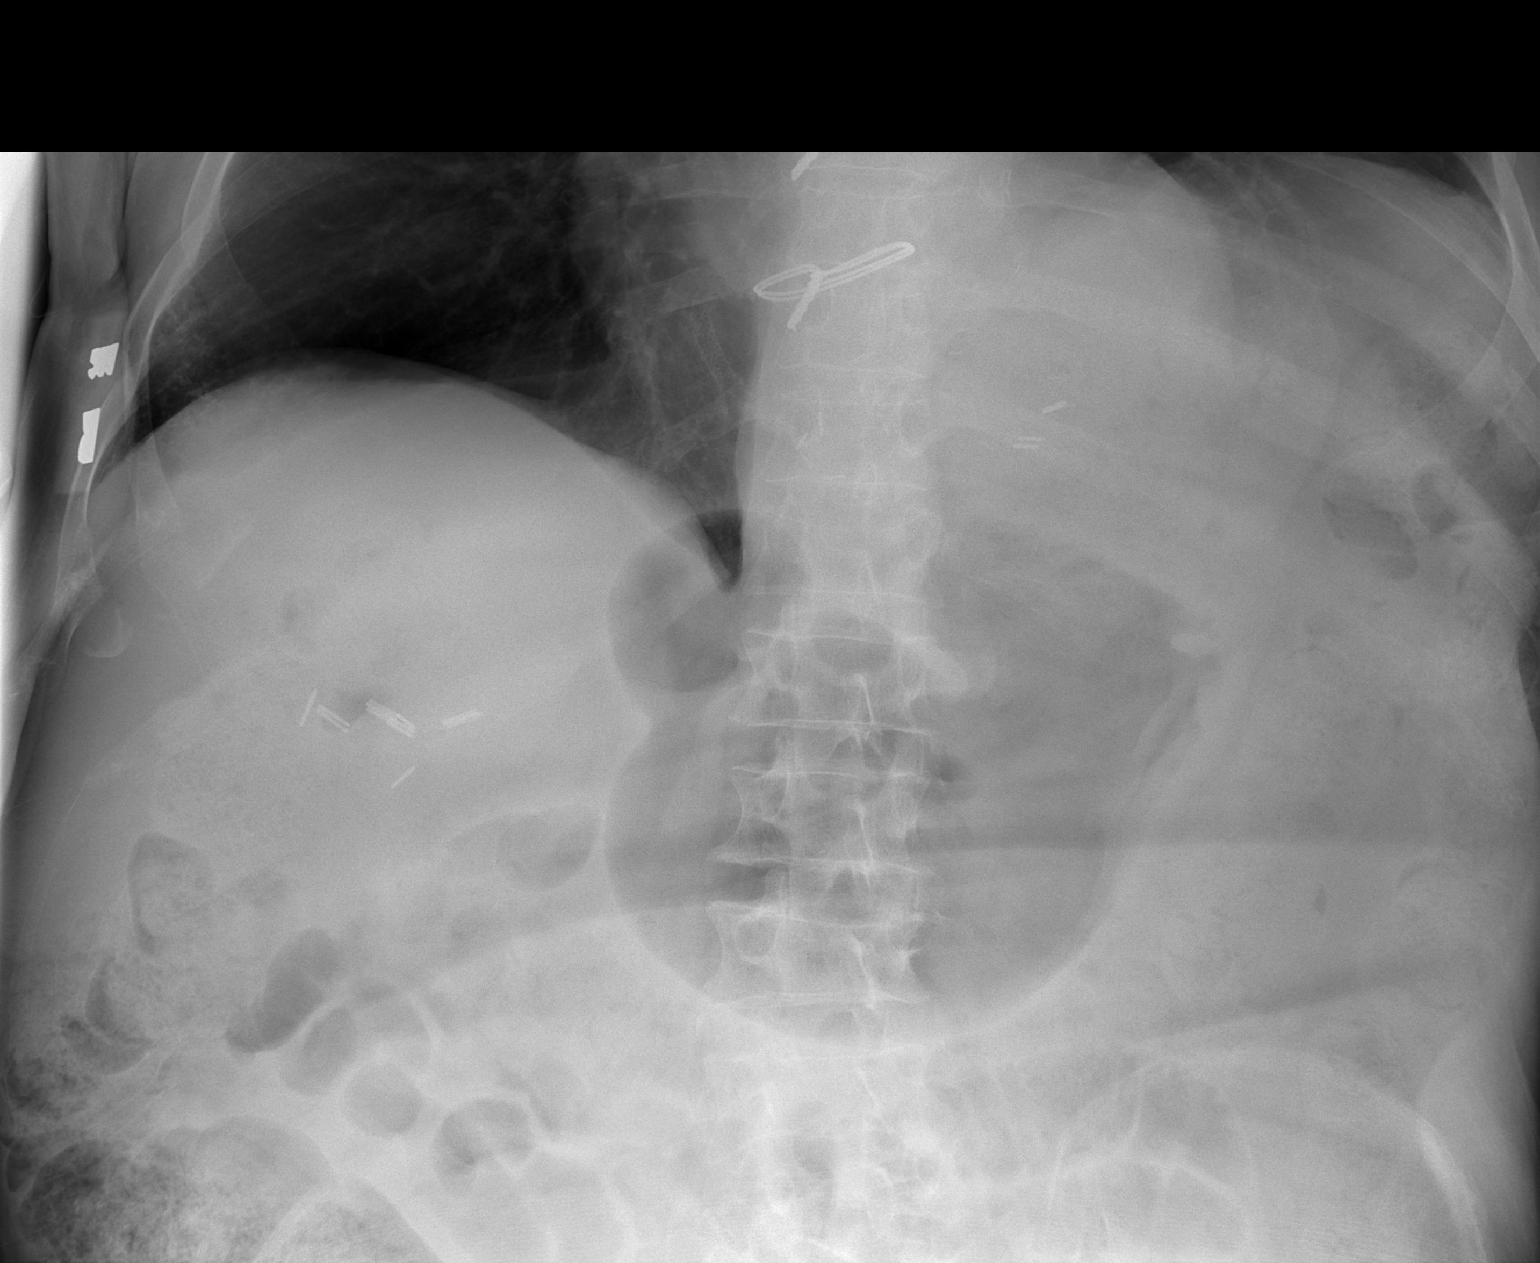

[2 of 2 positions shown; findings below may reference images not displayed]

FINDINGS: Chronic elevation of left diaphragm with atelectasis. Surgical clips
in the right upper quadrant. Large amount of stool in the colon.
Mild increased small bowel gas without definitive obstructive
pattern.
IMPRESSION: Overall nonobstructed gas pattern with large amount of stool in the
colon.

## 2023-01-04 DIAGNOSIS — F333 Major depressive disorder, recurrent, severe with psychotic symptoms: Secondary | ICD-10-CM | POA: Diagnosis not present

## 2023-01-04 DIAGNOSIS — F5105 Insomnia due to other mental disorder: Secondary | ICD-10-CM | POA: Diagnosis not present

## 2023-01-06 ENCOUNTER — Non-Acute Institutional Stay (SKILLED_NURSING_FACILITY): Payer: Medicare PPO | Admitting: Adult Health

## 2023-01-06 ENCOUNTER — Encounter: Payer: Self-pay | Admitting: Adult Health

## 2023-01-06 DIAGNOSIS — I739 Peripheral vascular disease, unspecified: Secondary | ICD-10-CM | POA: Diagnosis not present

## 2023-01-06 DIAGNOSIS — I700 Atherosclerosis of aorta: Secondary | ICD-10-CM

## 2023-01-06 DIAGNOSIS — E1129 Type 2 diabetes mellitus with other diabetic kidney complication: Secondary | ICD-10-CM

## 2023-01-06 DIAGNOSIS — R809 Proteinuria, unspecified: Secondary | ICD-10-CM

## 2023-01-06 NOTE — Progress Notes (Signed)
Location:  [place] Room Number: 130 P Place of Service:  SNF (31)   CODE STATUS: DNR  No Known Allergies  Chief Complaint  Patient presents with   Acute Visit    Care plan meeting     HPI:  We have come together for his care plan meeting. Family present . No BIMS; mood 0/30. Is nonambulatory with no falls. He requires max to dependent care for his adls. He is frequently incontinent of bladder and bowel. Dietary:  weight is 178 pounds regular diet: 25-75% appetite; requires assist with meals. Therapy: none at this time. Activities dining room; music and art. He is having periods of being very tired.  He continues to be followed for his chronic illnesses including:  Aortic atherosclerosis   Peripheral vascular disease  Microalbuminuria due to type 2 diabetes mellitus  Past Medical History:  Diagnosis Date   A-fib ([place])    a. Post-op afib after CABG 08/2012.   Acute gastric ulcer with hemorrhage    Acute respiratory failure with hypoxia (HCC)    Aphasia following cerebral infarction    CAD (coronary artery disease)    a. NSTEMI s/p stent to distal RCA 03/2000. b. Inf MI s/p emergent thrombectomy/stenting mid RCA 10/2000. c. NSTEMI s/p CABGx4 (LIMA-LAD, SVG-OM2, seq SVG-acute marginal and PD) 04/17/25 - course complicated by confusion, post-op AF, L pleural effusion with thoracentesis. d. Normal LV function by echo 08/2012.   Diverticulosis    DM with CKD    Dysphagia following cerebral infarction    Encephalopathy    Extended spectrum beta lactamase (ESBL) resistance    Generalized anxiety disorder    GERD (gastroesophageal reflux disease)    Hemiplegia and hemiparesis following cerebral infarction affecting right dominant side (HCC)    Hiatal hernia    HLD (hyperlipidemia)    HTN (hypertension)    Non-ST elevation (NSTEMI) myocardial infarction [facility])    Nontraumatic intracerebral hemorrhage in hemisphere, subcortical [facility])    Peripheral vascular disease  ([place])    a. Carotid dopplers neg 08/13/12. b. Pre-cabg ABIs - R=0.87 suggesting mild dz, L=1.29 possibly falsely elevated due to calcified vessels.   Pleural effusion    a. L pleural eff after CABG s/p thoracentesis 08/22/12.   Prostate cancer [facility])    Status post radiation treatment.   Prostatic hypertrophy    a. Hx of urinary retention, awaiting TURP.   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 Mild  MR by TEE 08/2012.    Past Surgical History:  Procedure Laterality Date   APPENDECTOMY     BACK SURGERY     CORONARY ARTERY BYPASS GRAFT  08/16/2012   Procedure: CORONARY ARTERY BYPASS GRAFTING (CABG);  Surgeon: Melrose Nakayama, MD;  Location: [place];  Service: Open Heart Surgery;  Laterality: N/A;   IR REPLC GASTRO/COLONIC TUBE PERCUT W/FLUORO  02/13/2019   LEFT HEART CATHETERIZATION WITH CORONARY ANGIOGRAM N/A 08/14/2012   Procedure: LEFT HEART CATHETERIZATION WITH CORONARY ANGIOGRAM;  Surgeon: Peter M Martinique, MD;  Location: [facility] CATH LAB;  Service: Cardiovascular;  Laterality: N/A;    Social History   Socioeconomic History   Marital status: Married    Spouse name: Not on file   Number of children: Not on file   Years of education: Not on file   Highest education level: Not on  file  Occupational History   Occupation: retired   Tobacco Use   Smoking status: Former    Types: Cigarettes    Quit date: 11/12/1972    Years since quitting: 50.1   Smokeless tobacco: Never  Vaping Use   Vaping Use: Never used  Substance and Sexual Activity   Alcohol use: No   Drug use: No   Sexual activity: Not Currently  Other Topics Concern   Not on file  Social History Narrative   He is a long term patient of [facility]    Social Determinants of Health   Financial Resource Strain: Low Risk  (07/01/2019)   Overall Financial Resource Strain (CARDIA)    Difficulty of Paying Living  Expenses: Not hard at all  Food Insecurity: Unknown (07/01/2019)   Hunger Vital Sign    Worried About [place] in the Last Year: Patient refused    [place] in the Last Year: Patient refused  Transportation Needs: Unknown (07/01/2019)   PRAPARE - Transportation    Lack of Transportation (Medical): Patient refused    Lack of Transportation (Non-Medical): Patient refused  Physical Activity: Inactive (10/30/2019)   Exercise Vital Sign    Days of Exercise per Week: 0 days    Minutes of Exercise per Session: 0 min  Stress: Unknown (07/01/2019)   [place] of Stress : Patient refused  Social Connections: Unknown (07/01/2019)   Social Connection and Isolation Panel [NHANES]    Frequency of Communication with Friends and Family: Patient refused    Frequency of Social Gatherings with Friends and Family: Patient refused    Attends Religious Services: Patient refused    Active Member of Clubs or Organizations: Patient refused    Attends Archivist Meetings: Patient refused    Marital Status: Patient refused  Intimate Partner Violence: Unknown (07/01/2019)   Humiliation, Afraid, Rape, and Kick questionnaire    Fear of Current or Ex-Partner: Patient refused    Emotionally Abused: Patient refused    Physically Abused: Patient refused    Sexually Abused: Patient refused   Family History  Problem Relation Age of Onset   Hypertension Mother       VITAL SIGNS BP (!) 140/80   Pulse 74   Temp 98 F (36.7 C)   Ht '5\' 11"'$  (1.803 m)   Wt 178 lb (80.7 kg)   BMI 24.83 kg/m   Outpatient Encounter Medications as of 01/06/2023  Medication Sig   acetaminophen (TYLENOL) 325 MG tablet Take 650 mg by mouth every 4 (four) hours as needed. per standing order for pain/fever DO NOT EXCEED >3,000 mg in 24 hours   amLODipine (NORVASC) 5 MG tablet Take 5 mg by mouth daily.    atorvastatin (LIPITOR) 40 MG  tablet Take 40 mg by mouth daily.   baclofen (LIORESAL) 10 MG tablet Take 10 mg by mouth 3 (three) times daily.   Balsam [place]-Castor Oil (VENELEX) OINT Apply topically. Special Instructions: Apply to sacrum, coccyx and bilateral buttocks qshift for prevention/ redness. Every Shift Day, Evening, Night   BD AUTOSHIELD DUO 30G X 5 MM MISC 3/16"   calcium carbonate (OSCAL) 1500 (600 Ca) MG TABS tablet Take 600 mg of elemental calcium by mouth daily with breakfast.   cholecalciferol (VITAMIN D3) 25 MCG (1000 UNIT) tablet Take 1,000 Units by mouth daily.   feeding supplement, GLUCERNA SHAKE, (GLUCERNA SHAKE) LIQD Take by mouth daily.  FLUoxetine (PROZAC) 20 MG capsule Take 20 mg by mouth daily.   hydrocortisone cream 0.5 % Apply 1 Application topically 2 (two) times daily as needed. apply to face around residents mouth   insulin glargine (LANTUS) 100 UNIT/ML injection Inject 20 Units into the skin daily.   insulin lispro (HUMALOG) 100 UNIT/ML cartridge Inject 5 Units into the skin in the morning and at bedtime. Give 5 units  with breakfast and lunch give if he eats more than 50% of meals   lansoprazole (PREVACID SOLUTAB) 30 MG disintegrating tablet Take 30 mg by mouth daily. Give before meals   loratadine (CLARITIN) 10 MG tablet Take 10 mg by mouth daily.   losartan (COZAAR) 25 MG tablet Take 25 mg by mouth daily. DX: Hypertensive heart and chronic kidney disease without heart failure, with stage 1 through stage 4 chronic kidney disease, or unspecified chronic kidney disease-per NP note, code updated   Melatonin 1 MG TABS Take 2 tablets by mouth at bedtime.    NON FORMULARY Diet Change: Regular, thin liquids   ondansetron (ZOFRAN-ODT) 4 MG disintegrating tablet Take 4 mg by mouth every 6 (six) hours as needed for nausea or vomiting.   rOPINIRole (REQUIP) 0.25 MG tablet Take 0.25 mg by mouth at bedtime.   No facility-administered encounter medications on file as of 01/06/2023.     SIGNIFICANT  DIAGNOSTIC EXAMS  PREVIOUS   03-01-22: right lower extremity venous doppler: no evidence of dvt within visualized vein of right lower extremity  07-27-22: dexa: t score -1.421  12-08-22: ct of head:  1. No acute intracranial abnormality. 2. Unchanged encephalomalacia in the left basal ganglia with prominent low-density changes in the left periventricular and subcortical white matter.  NO NEW EXAMS   LABS REVIEWED PREVIOUS:     01-03-22: glucose 71; bun 54; creat 2.15; k+ 4.4; na++ 138; ca 8.7; GFR 31; liver normal albumin 3.2; hgb a1c 6.2  01-10-22: glucose 89; bun 49; creat 1.90; k+ 4.2; na++ 141; ca 8.7; GFR 36 03-13-22: wbc 8.0; hgb 16.0; hct 51.2; mcv 993.6 plt 148; glucose 156; bun 47; creat 2.50; k+ 4.7; na++ 140; ca 8.7; GFR 26 CRP 6.4; d-dimer 0.41 03-21-22: crp: <0.5 06-27-22: wbc 7.7; hgb 15.4; hct 47.6; mcv 92.2 plt 159; glucose 151; bun 38; creat 2.13; k+ 4.1; na++ 142; ca 8.7; gfr 31; protein 6.1; albumin 2.9; hgb a1c 6.5; chol 103; ldl 65; trig 76; hdl 23  09-06-22; urine micro-albumin: 456.2 11-13-22: hgb A1c 7.5  NO NEW LABS.     Review of Systems  Reason unable to perform ROS: aphasia.    Physical Exam Constitutional:      General: He is not in acute distress.    Appearance: He is well-developed. He is not diaphoretic.  Neck:     Thyroid: No thyromegaly.  Cardiovascular:     Rate and Rhythm: Normal rate and regular rhythm.     Pulses: Normal pulses.     Heart sounds: Normal heart sounds.  Pulmonary:     Effort: Pulmonary effort is normal. No respiratory distress.     Breath sounds: Normal breath sounds.  Abdominal:     General: Bowel sounds are normal. There is no distension.     Palpations: Abdomen is soft.     Tenderness: There is no abdominal tenderness.  Musculoskeletal:     Cervical back: Neck supple.     Right lower leg: No edema.     Left lower leg: No edema.  Comments: Right hemiplegia  Lymphadenopathy:     Cervical: No cervical adenopathy.   Skin:    General: Skin is warm and dry.  Neurological:     Mental Status: He is alert. Mental status is at baseline.  Psychiatric:        Mood and Affect: Mood normal.        ASSESSMENT/ PLAN:  TODAY  Aortic atherosclerosis Peripheral vascular disease Microalbuminuria due to type 2 diabetes mellitus  Will continue current medications Will continue current plan of care Will continue to monitor his status.   Time spent with patient: 40 minutes: dietary; activities; medications.    Ok Edwards NP [facility]  call [phone]

## 2023-01-10 ENCOUNTER — Ambulatory Visit: Payer: Medicare PPO | Admitting: Urology

## 2023-01-19 ENCOUNTER — Encounter: Payer: Self-pay | Admitting: Adult Health

## 2023-01-19 ENCOUNTER — Non-Acute Institutional Stay (SKILLED_NURSING_FACILITY): Payer: Medicare PPO | Admitting: Adult Health

## 2023-01-19 DIAGNOSIS — E785 Hyperlipidemia, unspecified: Secondary | ICD-10-CM

## 2023-01-19 DIAGNOSIS — E1129 Type 2 diabetes mellitus with other diabetic kidney complication: Secondary | ICD-10-CM

## 2023-01-19 DIAGNOSIS — E1169 Type 2 diabetes mellitus with other specified complication: Secondary | ICD-10-CM | POA: Diagnosis not present

## 2023-01-19 DIAGNOSIS — F323 Major depressive disorder, single episode, severe with psychotic features: Secondary | ICD-10-CM

## 2023-01-19 DIAGNOSIS — M8580 Other specified disorders of bone density and structure, unspecified site: Secondary | ICD-10-CM

## 2023-01-19 DIAGNOSIS — R809 Proteinuria, unspecified: Secondary | ICD-10-CM

## 2023-01-19 NOTE — Progress Notes (Signed)
Location:  [place] Room Number: [id number] Place of Service:  SNF (31)   CODE STATUS: DNR   No Known Allergies  Chief Complaint  Patient presents with   Medical Management of Chronic Issues                   Osteopenia:  Dyslipidemia associated with type 2 diabetes mellitus: Micro-albuminuria due to type 2 diabetes mellitus:  Major depression with psychotic features    HPI:  He is a 77 year old long term resident of this facility being seen for the management of his chronic illnesses: Osteopenia:  Dyslipidemia associated with type 2 diabetes mellitus: Micro-albuminuria due to type 2 diabetes mellitus:  Major depression with psychotic features. There are no reports of uncontrolled pain. His weight is stable. He does go to dining room daily   Past Medical History:  Diagnosis Date   A-fib [facility])    a. Post-op afib after CABG 08/2012.   Acute gastric ulcer with hemorrhage    Acute respiratory failure with hypoxia (HCC)    Aphasia following cerebral infarction    CAD (coronary artery disease)    a. NSTEMI s/p stent to distal RCA 03/2000. b. Inf MI s/p emergent thrombectomy/stenting mid RCA 10/2000. c. NSTEMI s/p CABGx4 (LIMA-LAD, SVG-OM2, seq SVG-acute marginal and PD) [id number] - course complicated by confusion, post-op AF, L pleural effusion with thoracentesis. d. Normal LV function by echo 08/2012.   Diverticulosis    DM with CKD    Dysphagia following cerebral infarction    Encephalopathy    Extended spectrum beta lactamase (ESBL) resistance    Generalized anxiety disorder    GERD (gastroesophageal reflux disease)    Hemiplegia and hemiparesis following cerebral infarction affecting right dominant side (HCC)    Hiatal hernia    HLD (hyperlipidemia)    HTN (hypertension)    Non-ST elevation (NSTEMI) myocardial infarction [facility])    Nontraumatic intracerebral hemorrhage in hemisphere, subcortical [facility])    Peripheral vascular disease ([place])    a. Carotid dopplers neg  08/13/12. b. Pre-cabg ABIs - R=0.87 suggesting mild dz, L=1.29 possibly falsely elevated due to calcified vessels.   Pleural effusion    a. L pleural eff after CABG s/p thoracentesis 08/22/12.   Prostate cancer [facility])    Status post radiation treatment.   Prostatic hypertrophy    a. Hx of urinary retention, awaiting TURP.   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 Mild  MR by TEE 08/2012.    Past Surgical History:  Procedure Laterality Date   APPENDECTOMY     BACK SURGERY     CORONARY ARTERY BYPASS GRAFT  08/16/2012   Procedure: CORONARY ARTERY BYPASS GRAFTING (CABG);  Surgeon: Melrose Nakayama, MD;  Location: [place];  Service: Open Heart Surgery;  Laterality: N/A;   IR REPLC GASTRO/COLONIC TUBE PERCUT W/FLUORO  02/13/2019   LEFT HEART CATHETERIZATION WITH CORONARY ANGIOGRAM N/A 08/14/2012   Procedure: LEFT HEART CATHETERIZATION WITH CORONARY ANGIOGRAM;  Surgeon: Peter M Martinique, MD;  Location: [facility] CATH LAB;  Service: Cardiovascular;  Laterality: N/A;    Social History   Socioeconomic History   Marital status: Married    Spouse name: Not on file   Number of children: Not on file   Years of education: Not on file   Highest education level: Not on  file  Occupational History   Occupation: retired   Tobacco Use   Smoking status: Former    Types: Cigarettes    Quit date: 11/12/1972    Years since quitting: 50.2   Smokeless tobacco: Never  Vaping Use   Vaping Use: Never used  Substance and Sexual Activity   Alcohol use: No   Drug use: No   Sexual activity: Not Currently  Other Topics Concern   Not on file  Social History Narrative   He is a long term patient of [place]    Social Determinants of Health   Financial Resource Strain: Low Risk  (07/01/2019)   Overall Financial Resource Strain (CARDIA)    Difficulty of Paying Living Expenses: Not hard at all  Food  Insecurity: Unknown (07/01/2019)   Hunger Vital Sign    Worried About [place] in the Last Year: Patient refused    [place] in the Last Year: Patient refused  Transportation Needs: Unknown (07/01/2019)   PRAPARE - Transportation    Lack of Transportation (Medical): Patient refused    Lack of Transportation (Non-Medical): Patient refused  Physical Activity: Inactive (10/30/2019)   Exercise Vital Sign    Days of Exercise per Week: 0 days    Minutes of Exercise per Session: 0 min  Stress: Unknown (07/01/2019)   [place] of Stress : Patient refused  Social Connections: Unknown (07/01/2019)   Social Connection and Isolation Panel [NHANES]    Frequency of Communication with Friends and Family: Patient refused    Frequency of Social Gatherings with Friends and Family: Patient refused    Attends Religious Services: Patient refused    Active Member of Clubs or Organizations: Patient refused    Attends Archivist Meetings: Patient refused    Marital Status: Patient refused  Intimate Partner Violence: Unknown (07/01/2019)   Humiliation, Afraid, Rape, and Kick questionnaire    Fear of Current or Ex-Partner: Patient refused    Emotionally Abused: Patient refused    Physically Abused: Patient refused    Sexually Abused: Patient refused   Family History  Problem Relation Age of Onset   Hypertension Mother       VITAL SIGNS BP 134/78   Pulse 75   Temp 97.8 F (36.6 C)   Resp 18   Ht 5' 11"$  (1.803 m)   Wt 176 lb (79.8 kg)   SpO2 98%   BMI 24.55 kg/m   Outpatient Encounter Medications as of 01/19/2023  Medication Sig   acetaminophen (TYLENOL) 325 MG tablet Take 650 mg by mouth every 4 (four) hours as needed. per standing order for pain/fever DO NOT EXCEED >3,000 mg in 24 hours   atorvastatin (LIPITOR) 40 MG tablet Take 40 mg by mouth daily.   baclofen (LIORESAL) 10 MG tablet Take 10  mg by mouth 3 (three) times daily.   Balsam [place]-Castor Oil (VENELEX) OINT Apply topically. Special Instructions: Apply to sacrum, coccyx and bilateral buttocks qshift for prevention/ redness. Every Shift Day, Evening, Night   BD AUTOSHIELD DUO 30G X 5 MM MISC 3/16"   calcium carbonate (OSCAL) 1500 (600 Ca) MG TABS tablet Take 600 mg of elemental calcium by mouth daily with breakfast.   cholecalciferol (VITAMIN D3) 25 MCG (1000 UNIT) tablet Take 1,000 Units by mouth daily.   feeding supplement, GLUCERNA SHAKE, (GLUCERNA SHAKE) LIQD Take by mouth daily.   FLUoxetine (PROZAC) 20 MG capsule Take 20  mg by mouth daily.   insulin glargine (LANTUS) 100 UNIT/ML injection Inject 20 Units into the skin daily.   insulin lispro (HUMALOG) 100 UNIT/ML cartridge Inject 5 Units into the skin in the morning and at bedtime. Give 5 units  with breakfast and lunch give if he eats more than 50% of meals   lansoprazole (PREVACID SOLUTAB) 30 MG disintegrating tablet Take 30 mg by mouth daily. Give before meals   loratadine (CLARITIN) 10 MG tablet Take 10 mg by mouth daily.   losartan (COZAAR) 25 MG tablet Take 25 mg by mouth daily. DX: Hypertensive heart and chronic kidney disease without heart failure, with stage 1 through stage 4 chronic kidney disease, or unspecified chronic kidney disease-per NP note, code updated   Melatonin 1 MG TABS Take 2 tablets by mouth at bedtime.    NON FORMULARY Diet Change: Regular, thin liquids   ondansetron (ZOFRAN-ODT) 4 MG disintegrating tablet Take 4 mg by mouth every 6 (six) hours as needed for nausea or vomiting.   rOPINIRole (REQUIP) 0.25 MG tablet Take 0.25 mg by mouth at bedtime.   amLODipine (NORVASC) 5 MG tablet Take 5 mg by mouth daily.    [DISCONTINUED] hydrocortisone cream 0.5 % Apply 1 Application topically 2 (two) times daily as needed. apply to face around residents mouth   No facility-administered encounter medications on file as of 01/19/2023.     SIGNIFICANT  DIAGNOSTIC EXAMS   PREVIOUS   03-01-22: right lower extremity venous doppler: no evidence of dvt within visualized vein of right lower extremity  07-27-22: dexa: t score -1.421  12-08-22: ct of head:  1. No acute intracranial abnormality. 2. Unchanged encephalomalacia in the left basal ganglia with prominent low-density changes in the left periventricular and subcortical white matter.  NO NEW EXAMS   LABS REVIEWED PREVIOUS:     01-03-22: glucose 71; bun 54; creat 2.15; k+ 4.4; na++ 138; ca 8.7; GFR 31; liver normal albumin 3.2; hgb a1c 6.2  01-10-22: glucose 89; bun 49; creat 1.90; k+ 4.2; na++ 141; ca 8.7; GFR 36 03-13-22: wbc 8.0; hgb 16.0; hct 51.2; mcv 993.6 plt 148; glucose 156; bun 47; creat 2.50; k+ 4.7; na++ 140; ca 8.7; GFR 26 CRP 6.4; d-dimer 0.41 03-21-22: crp: <0.5 06-27-22: wbc 7.7; hgb 15.4; hct 47.6; mcv 92.2 plt 159; glucose 151; bun 38; creat 2.13; k+ 4.1; na++ 142; ca 8.7; gfr 31; protein 6.1; albumin 2.9; hgb a1c 6.5; chol 103; ldl 65; trig 76; hdl 23  09-06-22; urine micro-albumin: 456.2 11-13-22: hgb A1c 7.5; ACR 382.3   NO NEW LABS.   Review of Systems  Reason unable to perform ROS: aphasia.     Physical Exam Constitutional:      General: He is not in acute distress.    Appearance: He is well-developed. He is not diaphoretic.  Neck:     Thyroid: No thyromegaly.  Cardiovascular:     Rate and Rhythm: Normal rate and regular rhythm.     Pulses: Normal pulses.     Heart sounds: Normal heart sounds.  Pulmonary:     Effort: Pulmonary effort is normal. No respiratory distress.     Breath sounds: Normal breath sounds.  Abdominal:     General: Bowel sounds are normal. There is no distension.     Palpations: Abdomen is soft.     Tenderness: There is no abdominal tenderness.  Musculoskeletal:     Cervical back: Neck supple.     Right lower leg: No edema.  Left lower leg: No edema.     Comments: Right hemiplegia   Lymphadenopathy:     Cervical: No cervical  adenopathy.  Skin:    General: Skin is warm and dry.  Neurological:     Mental Status: He is alert. Mental status is at baseline.  Psychiatric:        Mood and Affect: Mood normal.       ASSESSMENT/ PLAN:  TODAY:   Osteopenia: t score -1.421 is on calcium and vitamin D supplements  2. Dyslipidemia associated with type 2 diabetes mellitus: ldl 65 will continue lipitor 40 mg daily   3. Micro-albuminuria due to type 2 diabetes mellitus: 382.3 is on ARB  4. Major depression with psychotic features: is taking prozac 20 mg daily     PREVIOUS   5. Chronic urine retention/bladder spasms: foley is out; is not on medications.  6. GERD without esophagitis: will continue prvacid 30 mg daily   7. Chronic non-seasonal allergic rhinitis: will continue claritin 10 mg daily    8. CKD stage 3 due to type 2 diabetes mellitus: bun 38; creat 2.13; gfr 31   9. Type 2 diabetes mellitus with peripheral vascular disease: hgb A1c 6.5 will continue humalog 5 units with meals twice daily lantus 20 units nightly is on statin arb  10. Protein calorie malnutrition severe weight is 176 pounds; albumin 2.9 will continue supplements as directed  11. History of nontraumatic subcortical hemorrhage left cerebral hemisphere/right spastic hemiplegia: 05-22-21 will continue baclofen 10 mg three times daily for spasticity   12. Restless leg syndrome: will continue reqiup 0.25 mg daily   13. Aortic atherosclerosis (ct 01-04-19) is on statin  14. History of dvt: has complete eliquis therapy  15. CAD native heart without angina: s/p cabg 2012; will continue cozaar 25 mg daily   16. Hypertension associated with type 2 diabetes mellitus: b/p 134/78 norvasc 5 mg daily and cozaar 25 mg daily     Ok Edwards NP [facility]  call [phone]

## 2023-02-13 DIAGNOSIS — F333 Major depressive disorder, recurrent, severe with psychotic symptoms: Secondary | ICD-10-CM | POA: Diagnosis not present

## 2023-02-13 DIAGNOSIS — F5105 Insomnia due to other mental disorder: Secondary | ICD-10-CM | POA: Diagnosis not present

## 2023-02-21 ENCOUNTER — Encounter: Payer: Self-pay | Admitting: Adult Health

## 2023-02-21 ENCOUNTER — Non-Acute Institutional Stay (SKILLED_NURSING_FACILITY): Payer: Medicare PPO | Admitting: Adult Health

## 2023-02-21 DIAGNOSIS — J3089 Other allergic rhinitis: Secondary | ICD-10-CM | POA: Diagnosis not present

## 2023-02-21 DIAGNOSIS — N3289 Other specified disorders of bladder: Secondary | ICD-10-CM

## 2023-02-21 DIAGNOSIS — N183 Chronic kidney disease, stage 3 unspecified: Secondary | ICD-10-CM | POA: Diagnosis not present

## 2023-02-21 DIAGNOSIS — R339 Retention of urine, unspecified: Secondary | ICD-10-CM

## 2023-02-21 DIAGNOSIS — K219 Gastro-esophageal reflux disease without esophagitis: Secondary | ICD-10-CM

## 2023-02-21 DIAGNOSIS — E1122 Type 2 diabetes mellitus with diabetic chronic kidney disease: Secondary | ICD-10-CM | POA: Diagnosis not present

## 2023-02-21 NOTE — Progress Notes (Signed)
Location:  [facility] Room Number: S130P Place of Service:  SNF (31)   CODE STATUS: DNR  No Known Allergies  Chief Complaint  Patient presents with   Medical Management of Chronic Issues                       Chronic urine retention/bladder spasms: GERD without esophagitis:  Chronic non-seasonal allergic rhinitis:  CKD stage 3 due to type 2 diabetes mellitus.      HPI:  He is a 77 year old long term resident of this facility being seen for the management of his chronic illnesses:  Chronic urine retention/bladder spasms: GERD without esophagitis:  Chronic non-seasonal allergic rhinitis:  CKD stage 3 due to type 2 diabetes mellitus. He is having pain with bowel movements. He does yell out with the pain. Staff is concerned about constipation.   Past Medical History:  Diagnosis Date   A-fib [facility])    a. Post-op afib after CABG 08/2012.   Acute gastric ulcer with hemorrhage    Acute respiratory failure with hypoxia (HCC)    Aphasia following cerebral infarction    CAD (coronary artery disease)    a. NSTEMI s/p stent to distal RCA 03/2000. b. Inf MI s/p emergent thrombectomy/stenting mid RCA 10/2000. c. NSTEMI s/p CABGx4 (LIMA-LAD, SVG-OM2, seq SVG-acute marginal and PD) 08/16/12 - course complicated by confusion, post-op AF, L pleural effusion with thoracentesis. d. Normal LV function by echo 08/2012.   Diverticulosis    DM with CKD    Dysphagia following cerebral infarction    Encephalopathy    Extended spectrum beta lactamase (ESBL) resistance    Generalized anxiety disorder    GERD (gastroesophageal reflux disease)    Hemiplegia and hemiparesis following cerebral infarction affecting right dominant side (HCC)    Hiatal hernia    HLD (hyperlipidemia)    HTN (hypertension)    Non-ST elevation (NSTEMI) myocardial infarction [facility])    Nontraumatic intracerebral hemorrhage in hemisphere, subcortical [facility])    Peripheral vascular disease (HCC)    a. Carotid  dopplers neg 08/13/12. b. Pre-cabg ABIs - R=0.87 suggesting mild dz, L=1.29 possibly falsely elevated due to calcified vessels.   Pleural effusion    a. L pleural eff after CABG s/p thoracentesis 08/22/12.   Prostate cancer [facility])    Status post radiation treatment.   Prostatic hypertrophy    a. Hx of urinary retention, awaiting TURP.   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 Mild  MR by TEE 08/2012.    Past Surgical History:  Procedure Laterality Date   APPENDECTOMY     BACK SURGERY     CORONARY ARTERY BYPASS GRAFT  08/16/2012   Procedure: CORONARY ARTERY BYPASS GRAFTING (CABG);  Surgeon: Loreli Slot, MD;  Location: [facility] OR;  Service: Open Heart Surgery;  Laterality: N/A;   IR REPLC GASTRO/COLONIC TUBE PERCUT W/FLUORO  02/13/2019   LEFT HEART CATHETERIZATION WITH CORONARY ANGIOGRAM N/A 08/14/2012   Procedure: LEFT HEART CATHETERIZATION WITH CORONARY ANGIOGRAM;  Surgeon: Peter M [place]az[place], MD;  Location: [facility] CATH LAB;  Service: Cardiovascular;  Laterality: N/A;    Social History   Socioeconomic History   Marital status: Married    Spouse name: Not on file   Number of children: Not on file   Years of education: Not on file  Highest education level: Not on file  Occupational History   Occupation: retired   Tobacco Use   Smoking status: Former    Types: Cigarettes    Quit date: 11/12/1972    Years since quitting: 50.3   Smokeless tobacco: Never  Vaping Use   Vaping Use: Never used  Substance and Sexual Activity   Alcohol use: No   Drug use: No   Sexual activity: Not Currently  Other Topics Concern   Not on file  Social History Narrative   He is a long term patient of [facility]    Social Determinants of Health   Financial Resource Strain: Low Risk  (07/01/2019)   Overall Financial Resource Strain (CARDIA)    Difficulty of Paying Living Expenses: Not hard  at all  Food Insecurity: Unknown (07/01/2019)   Hunger Vital Sign    Worried About Running Out of Food in the Last Year: Patient refused    Ran Out of Food in the Last Year: Patient refused  Transportation Needs: Unknown (07/01/2019)   PRAPARE - Transportation    Lack of Transportation (Medical): Patient refused    Lack of Transportation (Non-Medical): Patient refused  Physical Activity: Inactive (10/30/2019)   Exercise Vital Sign    Days of Exercise per Week: 0 days    Minutes of Exercise per Session: 0 min  Stress: Unknown (07/01/2019)   [employer] of Occupational Health - Occupational Stress Questionnaire    Feeling of Stress : Patient refused  Social Connections: Unknown (07/01/2019)   Social Connection and Isolation Panel [NHANES]    Frequency of Communication with Friends and Family: Patient refused    Frequency of Social Gatherings with Friends and Family: Patient refused    Attends Religious Services: Patient refused    Active Member of Clubs or Organizations: Patient refused    Attends Banker Meetings: Patient refused    Marital Status: Patient refused  Intimate Partner Violence: Unknown (07/01/2019)   Humiliation, Afraid, Rape, and Kick questionnaire    Fear of Current or Ex-Partner: Patient refused    Emotionally Abused: Patient refused    Physically Abused: Patient refused    Sexually Abused: Patient refused   Family History  Problem Relation Age of Onset   Hypertension Mother       VITAL SIGNS BP (!) 109/57   Pulse 70   Temp 98.1 F (36.7 C)   Ht 5\' 11"  (1.803 m)   Wt 177 lb (80.3 kg)   SpO2 95%   BMI 24.69 kg/m   Outpatient Encounter Medications as of 02/21/2023  Medication Sig   acetaminophen (TYLENOL) 325 MG tablet Take 650 mg by mouth every 4 (four) hours as needed. per standing order for pain/fever DO NOT EXCEED >3,000 mg in 24 hours   amLODipine (NORVASC) 5 MG tablet Take 5 mg by mouth daily.    atorvastatin (LIPITOR) 40 MG tablet  Take 40 mg by mouth daily.   baclofen (LIORESAL) 10 MG tablet Take 10 mg by mouth 3 (three) times daily.   Balsam [place]-Castor Oil (VENELEX) OINT Apply topically. Special Instructions: Apply to sacrum, coccyx and bilateral buttocks qshift for prevention/ redness. Every Shift Day, Evening, Night   BD AUTOSHIELD DUO 30G X 5 MM MISC 3/16"   calcium carbonate (OSCAL) 1500 (600 Ca) MG TABS tablet Take 600 mg of elemental calcium by mouth daily with breakfast.   cholecalciferol (VITAMIN D3) 25 MCG (1000 UNIT) tablet Take 1,000 Units by mouth daily.   feeding supplement, GLUCERNA SHAKE, (  GLUCERNA SHAKE) LIQD Take by mouth daily.   FLUoxetine (PROZAC) 20 MG capsule Take 20 mg by mouth daily.   insulin glargine (LANTUS) 100 UNIT/ML injection Inject 20 Units into the skin daily.   insulin lispro (HUMALOG) 100 UNIT/ML cartridge Inject 5 Units into the skin in the morning and at bedtime. Give 5 units  with breakfast and lunch give if he eats more than 50% of meals   lansoprazole (PREVACID SOLUTAB) 30 MG disintegrating tablet Take 30 mg by mouth daily. Give before meals   loratadine (CLARITIN) 10 MG tablet Take 10 mg by mouth daily.   losartan (COZAAR) 25 MG tablet Take 25 mg by mouth daily. DX: Hypertensive heart and chronic kidney disease without heart failure, with stage 1 through stage 4 chronic kidney disease, or unspecified chronic kidney disease-per NP note, code updated   Melatonin 1 MG TABS Take 2 tablets by mouth at bedtime.    NON FORMULARY Diet Change: Regular, thin liquids   ondansetron (ZOFRAN-ODT) 4 MG disintegrating tablet Take 4 mg by mouth every 6 (six) hours as needed for nausea or vomiting.   rOPINIRole (REQUIP) 0.25 MG tablet Take 0.25 mg by mouth at bedtime.   No facility-administered encounter medications on file as of 02/21/2023.     SIGNIFICANT DIAGNOSTIC EXAMS   PREVIOUS   03-01-22: right lower extremity venous doppler: no evidence of dvt within visualized vein of right lower  extremity  07-27-22: dexa: t score -1.421  12-08-22: ct of head:  1. No acute intracranial abnormality. 2. Unchanged encephalomalacia in the left basal ganglia with prominent low-density changes in the left periventricular and subcortical white matter.  NO NEW EXAMS   LABS REVIEWED PREVIOUS:     03-13-22: wbc 8.0; hgb 16.0; hct 51.2; mcv 993.6 plt 148; glucose 156; bun 47; creat 2.50; k+ 4.7; na++ 140; ca 8.7; GFR 26 CRP 6.4; d-dimer 0.41 03-21-22: crp: <0.5 06-27-22: wbc 7.7; hgb 15.4; hct 47.6; mcv 92.2 plt 159; glucose 151; bun 38; creat 2.13; k+ 4.1; na++ 142; ca 8.7; gfr 31; protein 6.1; albumin 2.9; hgb a1c 6.5; chol 103; ldl 65; trig 76; hdl 23  09-06-22; urine micro-albumin: 456.2 11-13-22: hgb A1c 7.5; ACR 382.3   NO NEW LABS.   Review of Systems  Reason unable to perform ROS: aphasia.   Physical Exam Constitutional:      General: He is not in acute distress.    Appearance: He is well-developed. He is not diaphoretic.  Neck:     Thyroid: No thyromegaly.  Cardiovascular:     Rate and Rhythm: Normal rate and regular rhythm.     Pulses: Normal pulses.     Heart sounds: Normal heart sounds.  Pulmonary:     Effort: Pulmonary effort is normal. No respiratory distress.     Breath sounds: Normal breath sounds.  Abdominal:     General: Bowel sounds are normal. There is no distension.     Palpations: Abdomen is soft.     Tenderness: There is abdominal tenderness.  Musculoskeletal:     Cervical back: Neck supple.     Right lower leg: No edema.     Left lower leg: No edema.     Comments: Right hemiplegia    Lymphadenopathy:     Cervical: No cervical adenopathy.  Skin:    General: Skin is warm and dry.  Neurological:     Mental Status: He is alert. Mental status is at baseline.  Psychiatric:  Mood and Affect: Mood normal.      ASSESSMENT/ PLAN:  TODAY:   Chronic urine retention/bladder spasms: foley is out; not on medications  2. GERD without esophagitis:  will continue prevacid 30 mg daily   3. Chronic non-seasonal allergic rhinitis: will continue claritin 10 mg daily   4. CKD stage 3 due to type 2 diabetes mellitus: bun 38; craet 2.13; gfr 31     PREVIOUS   5. Type 2 diabetes mellitus with peripheral vascular disease: hgb A1c 6.5 will continue humalog 5 units with meals twice daily lantus 20 units nightly is on statin arb  6. Protein calorie malnutrition severe weight is 17 pounds; albumin 2.9 will continue supplements as directed  7. History of nontraumatic subcortical hemorrhage left cerebral hemisphere/right spastic hemiplegia: 05-22-21 will continue baclofen 10 mg three times daily for spasticity   8. Restless leg syndrome: will continue reqiup 0.25 mg daily   9. Aortic atherosclerosis (ct 01-04-19) is on statin  10. History of dvt: has complete eliquis therapy  11. CAD native heart without angina: s/p cabg 2012; will continue cozaar 25 mg daily   12. Hypertension associated with type 2 diabetes mellitus: b/p 109/57  norvasc 5 mg daily and cozaar 25 mg daily  13. Osteopenia: t score -1.421 is on calcium and vitamin D supplements  14. Dyslipidemia associated with type 2 diabetes mellitus: ldl 65 will continue lipitor 40 mg daily   15. Micro-albuminuria due to type 2 diabetes mellitus: 382.3 is on ARB  16. Major depression with psychotic features: is taking prozac 20 mg daily   17. Chronic constipation: will begin miralax daily    Will check cbc; cmp; hgb A1c; lipids; screening psa    Synthia Innocent NP [facility]   call [phone]

## 2023-02-22 DIAGNOSIS — J3089 Other allergic rhinitis: Secondary | ICD-10-CM | POA: Insufficient documentation

## 2023-02-22 DIAGNOSIS — K219 Gastro-esophageal reflux disease without esophagitis: Secondary | ICD-10-CM | POA: Insufficient documentation

## 2023-03-13 DIAGNOSIS — F5105 Insomnia due to other mental disorder: Secondary | ICD-10-CM | POA: Diagnosis not present

## 2023-03-13 DIAGNOSIS — F333 Major depressive disorder, recurrent, severe with psychotic symptoms: Secondary | ICD-10-CM | POA: Diagnosis not present

## 2023-03-15 ENCOUNTER — Encounter: Payer: Self-pay | Admitting: Adult Health

## 2023-03-15 ENCOUNTER — Non-Acute Institutional Stay (SKILLED_NURSING_FACILITY): Payer: Medicare PPO | Admitting: Adult Health

## 2023-03-15 DIAGNOSIS — G8111 Spastic hemiplegia affecting right dominant side: Secondary | ICD-10-CM | POA: Diagnosis not present

## 2023-03-15 DIAGNOSIS — F0150 Vascular dementia without behavioral disturbance: Secondary | ICD-10-CM | POA: Diagnosis not present

## 2023-03-15 DIAGNOSIS — I6930 Unspecified sequelae of cerebral infarction: Secondary | ICD-10-CM

## 2023-03-15 NOTE — Progress Notes (Signed)
Location:  [facility] Room Number: 130 P Place of Service:  SNF (31)   CODE STATUS: DNR  No Known Allergies  Chief Complaint  Patient presents with   Acute Visit    Weight issues    HPI:  He is losing weight. 180 days ago hies weight was 184.5 pounds with his weight is April 174.6 pounds. His normal weight is in the 170's. His wife is involved in his dietary intake. She monitor his intake at lunch. She is concerned that he is declining and is at the "end of his life". At this time his weight is stable. There have been no recent reports of uncontrolled pain.   Past Medical History:  Diagnosis Date   A-fib    a. Post-op afib after CABG 08/2012.   Acute gastric ulcer with hemorrhage    Acute respiratory failure with hypoxia    Aphasia following cerebral infarction    CAD (coronary artery disease)    a. NSTEMI s/p stent to distal RCA 03/2000. b. Inf MI s/p emergent thrombectomy/stenting mid RCA 10/2000. c. NSTEMI s/p CABGx4 (LIMA-LAD, SVG-OM2, seq SVG-acute marginal and PD) 08/16/12 - course complicated by confusion, post-op AF, L pleural effusion with thoracentesis. d. Normal LV function by echo 08/2012.   Diverticulosis    DM with CKD    Dysphagia following cerebral infarction    Encephalopathy    Extended spectrum beta lactamase (ESBL) resistance    Generalized anxiety disorder    GERD (gastroesophageal reflux disease)    Hemiplegia and hemiparesis following cerebral infarction affecting right dominant side    Hiatal hernia    HLD (hyperlipidemia)    HTN (hypertension)    Non-ST elevation (NSTEMI) myocardial infarction    Nontraumatic intracerebral hemorrhage in hemisphere, subcortical    Peripheral vascular disease    a. Carotid dopplers neg 08/13/12. b. Pre-cabg ABIs - R=0.87 suggesting mild dz, L=1.29 possibly falsely elevated due to calcified vessels.   Pleural effusion    a. L pleural eff after CABG s/p thoracentesis 08/22/12.   Prostate cancer     Status post radiation treatment.   Prostatic hypertrophy    a. Hx of urinary retention, awaiting TURP.   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 Mild  MR by TEE 08/2012.    Past Surgical History:  Procedure Laterality Date   APPENDECTOMY     BACK SURGERY     CORONARY ARTERY BYPASS GRAFT  08/16/2012   Procedure: CORONARY ARTERY BYPASS GRAFTING (CABG);  Surgeon: Loreli SlotSteven C Hendrickson, MD;  [facility];  Service: Open Heart Surgery;  Laterality: N/A;   IR REPLC GASTRO/COLONIC TUBE PERCUT W/FLUORO  02/13/2019   LEFT HEART CATHETERIZATION WITH CORONARY ANGIOGRAM N/A 08/14/2012   Procedure: LEFT HEART CATHETERIZATION WITH CORONARY ANGIOGRAM;  Surgeon: Peter M SwazilandJordan, MD;  [facility];  Service: Cardiovascular;  Laterality: N/A;    Social History   Socioeconomic History   Marital status: Married    Spouse name: Not on file   Number of children: Not on file   Years of education: Not on file   Highest education level: Not on file  Occupational History   Occupation: retired   Tobacco Use   Smoking status: Former    Types: Cigarettes    Quit date: 11/12/1972    Years since quitting: 50.3  Smokeless tobacco: Never  Vaping Use   Vaping Use: Never used  Substance and Sexual Activity   Alcohol use: No   Drug use: No   Sexual activity: Not Currently  Other Topics Concern   Not on file  Social History Narrative   He is a long term patient of [facility]    Social Determinants of Health   Financial Resource Strain: Low Risk  (07/01/2019)   Overall Financial Resource Strain (CARDIA)    Difficulty of Paying Living Expenses: Not hard at all  Food Insecurity: Unknown (07/01/2019)   Hunger Vital Sign    Worried About Running Out of Food in the Last Year: Patient declined    Ran Out of Food in the Last Year: Patient declined  Transportation Needs: Unknown (07/01/2019)    PRAPARE - Administrator, Civil Service (Medical): Patient declined    Lack of Transportation (Non-Medical): Patient declined  Physical Activity: Inactive (10/30/2019)   Exercise Vital Sign    Days of Exercise per Week: 0 days    Minutes of Exercise per Session: 0 min  Stress: Unknown (07/01/2019)   [employer] of Occupational Health - Occupational Stress Questionnaire    Feeling of Stress : Patient declined  Social Connections: Unknown (07/01/2019)   Social Connection and Isolation Panel [NHANES]    Frequency of Communication with Friends and Family: Patient declined    Frequency of Social Gatherings with Friends and Family: Patient declined    Attends Religious Services: Patient declined    Database administrator or Organizations: Patient declined    Attends Banker Meetings: Patient declined    Marital Status: Patient declined  Intimate Partner Violence: Unknown (07/01/2019)   Humiliation, Afraid, Rape, and Kick questionnaire    Fear of Current or Ex-Partner: Patient declined    Emotionally Abused: Patient declined    Physically Abused: Patient declined    Sexually Abused: Patient declined   Family History  Problem Relation Age of Onset   Hypertension Mother       VITAL SIGNS BP 138/78   Pulse 67   Temp 98 F (36.7 C)   Resp 20   Ht  (1.803 m)   Wt 171 lb 8 oz (77.8 kg)   SpO2 95%   BMI 23.92 kg/m   Outpatient Encounter Medications as of 03/15/2023  Medication Sig   acetaminophen (TYLENOL) 325 MG tablet Take 650 mg by mouth every 4 (four) hours as needed. per standing order for pain/fever DO NOT EXCEED >3,000 mg in 24 hours   amLODipine (NORVASC) 5 MG tablet Take 5 mg by mouth daily.    atorvastatin (LIPITOR) 40 MG tablet Take 40 mg by mouth daily.   baclofen (LIORESAL) 10 MG tablet Take 10 mg by mouth 3 (three) times daily.   Balsam [place]-Castor Oil (VENELEX) OINT Apply topically. Special Instructions: Apply to sacrum, coccyx and  bilateral buttocks qshift for prevention/ redness. Every Shift Day, Evening, Night   BD AUTOSHIELD DUO 30G X 5 MM MISC 3/16"   cholecalciferol (VITAMIN D3) 25 MCG (1000 UNIT) tablet Take 1,000 Units by mouth daily.   feeding supplement, GLUCERNA SHAKE, (GLUCERNA SHAKE) LIQD Take by mouth daily.   FLUoxetine (PROZAC) 20 MG capsule Take 20 mg by mouth daily.   insulin glargine (LANTUS) 100 UNIT/ML injection Inject 20 Units into the skin daily.   insulin lispro (HUMALOG) 100 UNIT/ML cartridge Inject 5 Units into the skin in the morning and at bedtime. Give 5 units  with breakfast and lunch give if he eats more than 50% of meals   lansoprazole (PREVACID SOLUTAB) 30 MG disintegrating tablet Take 30 mg by mouth daily. Give before meals   loratadine (CLARITIN) 10 MG tablet Take 10 mg by mouth daily.   losartan (COZAAR) 25 MG tablet Take 25 mg by mouth daily. DX: Hypertensive heart and chronic kidney disease without heart failure, with stage 1 through stage 4 chronic kidney disease, or unspecified chronic kidney disease-per NP note, code updated   Melatonin 1 MG TABS Take 2 tablets by mouth at bedtime.    NON FORMULARY Diet Change: Regular, thin liquids   ondansetron (ZOFRAN-ODT) 4 MG disintegrating tablet Take 4 mg by mouth every 6 (six) hours as needed for nausea or vomiting.   rOPINIRole (REQUIP) 0.25 MG tablet Take 0.25 mg by mouth at bedtime.   senna (SENOKOT) 8.6 MG tablet Take 1 tablet by mouth 2 (two) times daily.   [DISCONTINUED] calcium carbonate (OSCAL) 1500 (600 Ca) MG TABS tablet Take 600 mg of elemental calcium by mouth daily with breakfast.   [DISCONTINUED] polyethylene glycol powder (GLYCOLAX/MIRALAX) 17 GM/SCOOP powder Take 17 g by mouth daily.   No facility-administered encounter medications on file as of 03/15/2023.     SIGNIFICANT DIAGNOSTIC EXAMS  PREVIOUS   07-27-22: dexa: t score -1.421  12-08-22: ct of head:  1. No acute intracranial abnormality. 2. Unchanged  encephalomalacia in the left basal ganglia with prominent low-density changes in the left periventricular and subcortical white matter.  NO NEW EXAMS   LABS REVIEWED PREVIOUS:     03-13-22: wbc 8.0; hgb 16.0; hct 51.2; mcv 993.6 plt 148; glucose 156; bun 47; creat 2.50; k+ 4.7; na++ 140; ca 8.7; GFR 26 CRP 6.4; d-dimer 0.41 03-21-22: crp: <0.5 06-27-22: wbc 7.7; hgb 15.4; hct 47.6; mcv 92.2 plt 159; glucose 151; bun 38; creat 2.13; k+ 4.1; na++ 142; ca 8.7; gfr 31; protein 6.1; albumin 2.9; hgb a1c 6.5; chol 103; ldl 65; trig 76; hdl 23  09-06-22; urine micro-albumin: 456.2 11-13-22: hgb A1c 7.5; ACR 382.3   NO NEW LABS.   Review of Systems  Reason unable to perform ROS: aphasia.   Physical Exam Constitutional:      General: He is not in acute distress.    Appearance: He is well-developed. He is not diaphoretic.  Neck:     Thyroid: No thyromegaly.  Cardiovascular:     Rate and Rhythm: Normal rate and regular rhythm.     Pulses: Normal pulses.     Heart sounds: Normal heart sounds.  Pulmonary:     Effort: Pulmonary effort is normal. No respiratory distress.     Breath sounds: Normal breath sounds.  Abdominal:     General: Bowel sounds are normal. There is no distension.     Palpations: Abdomen is soft.     Tenderness: There is no abdominal tenderness.  Musculoskeletal:     Cervical back: Neck supple.     Right lower leg: No edema.     Left lower leg: No edema.     Comments: Right hemiplegia   Lymphadenopathy:     Cervical: No cervical adenopathy.  Skin:    General: Skin is warm and dry.  Neurological:     Mental Status: He is alert. Mental status is at baseline.  Psychiatric:        Mood and Affect: Mood normal.       ASSESSMENT/ PLAN:  TODAY  Right spastic hemiplegia Vascular dementia without behavioral  disturbance History of hemorrhagic cerebrovascular accident (CVA) with residual deficit  At this time will not make any changes will continue to monitor her  status and will make changes in the future as indicated     Synthia Innocent NP [facility]   call [phone]

## 2023-03-28 ENCOUNTER — Non-Acute Institutional Stay (SKILLED_NURSING_FACILITY): Payer: Medicare PPO | Admitting: Internal Medicine

## 2023-03-28 ENCOUNTER — Encounter: Payer: Self-pay | Admitting: Internal Medicine

## 2023-03-28 DIAGNOSIS — E43 Unspecified severe protein-calorie malnutrition: Secondary | ICD-10-CM

## 2023-03-28 DIAGNOSIS — R5383 Other fatigue: Secondary | ICD-10-CM

## 2023-03-28 DIAGNOSIS — I4891 Unspecified atrial fibrillation: Secondary | ICD-10-CM | POA: Diagnosis not present

## 2023-03-28 DIAGNOSIS — N183 Chronic kidney disease, stage 3 unspecified: Secondary | ICD-10-CM | POA: Insufficient documentation

## 2023-03-28 DIAGNOSIS — G8111 Spastic hemiplegia affecting right dominant side: Secondary | ICD-10-CM | POA: Diagnosis not present

## 2023-03-28 DIAGNOSIS — N1832 Chronic kidney disease, stage 3b: Secondary | ICD-10-CM | POA: Diagnosis not present

## 2023-03-28 NOTE — Patient Instructions (Signed)
See assessment and plan under each diagnosis in the problem list and acutely for this visit 

## 2023-03-28 NOTE — Assessment & Plan Note (Addendum)
Rhythm is clinically irregular , but rate is well-controlled.  No change indicated.

## 2023-03-28 NOTE — Progress Notes (Signed)
NURSING HOME LOCATION:  [facility] ROOM NUMBER:  130 P  CODE STATUS:  DNR  PCP:  Synthia Innocent NP  This is a nursing facility follow up visit of chronic medical diagnoses & to document compliance with Regulation 483.30 (c) in The Long Term Care Survey Manual Phase 2 which mandates caregiver visit ( visits can alternate among physician, PA or NP as per statutes) within 10 days of 30 days / 60 days/ 90 days post admission to SNF date    Interim medical record and care since last SNF visit was updated with review of diagnostic studies and change in clinical status since last visit were documented.  HPI: He is a permanent resident of this facility with medical diagnoses of atrial fibrillation, GERD, history of gastric ulcer, CAD with history of NSTEMI, history of diverticulosis, diabetes with CKD and neurovascular complications, dyslipidemia, essential hypertension, history of prostate cancer, history of protein/caloric malnutrition, and history of hemorrhagic stroke with hemiparesis. Significant surgeries include CABG.  Most recent labs were performed in December 2023 and revealed an A1c of 7.5% with urine microalbumin of 382.3.  5 months earlier the A1c had been 6.5%.  On 06/27/2022 creatinine was 2.13 and GFR 31 indicating low stage IIIb CKD.  Albumin was 2.9 and total protein 6.1, compatible with protein/caloric malnutrition.  At that time LDL was at goal with a value of 65.  CBC was normal.  Review of systems: Nutritionist has been monitoring him because of history of 10 pound weight loss over the previous 6 months.  He is essentially nonverbal and actually spent most of the interview and exam asleep.  The history was provided by his wife.  She states that he has been sleeping more recently and seems "more tired."  She stated that he told her he had pain with urination and she noted him to be groaning.  Urology follow-up is next week.  When I asked about dysuria and he shook his  head no.  She states that at times he will be alert and calls her multiple times a day,but other days he does not contact her. She states that he has been here "going on 5 years."   She states that he does express the desire to go home; but she states that his care is "too much" and he is "dead weight."  He does have intermittent constipation.  Physical exam:  Pertinent or positive findings: As noted he was initially asleep but aroused easily.  He remained lethargic when awake and exhibited blank facies.  The eyebrows are decreased laterally.  He has pattern alopecia.  He exhibits slight exotropia of the right eye.  There is a papule which appears benign in the right lateral eyebrow.  Medial to the left eyebrow is a papule which is excoriated.  His wife states that he repeatedly scratches this area.  Dental hygiene is excellent.  Heart rhythm is irregular.  Breath sounds are decreased.  He exhibits isolated high-pitched expiratory wheezing on the right . Pedal pulses are decreased but palpable.  When the left foot is examined the toe was upgoing.  He exhibits limb atrophy.  The right hand is in a brace.  The left upper extremity is stronger to opposition than the other extremities.  There is minimal movement in the right upper extremity.  The left lower extremity is clinically stronger than the right to opposition.  General appearance: Adequately nourished; no acute distress, increased work of breathing is present.  Lymphatic: No lymphadenopathy about the head, neck, axilla. Eyes: No conjunctival inflammation or lid edema is present. There is no scleral icterus. Ears:  External ear exam shows no significant lesions or deformities.   Nose:  External nasal examination shows no deformity or inflammation. Nasal mucosa are pink and moist without lesions, exudates Oral exam:  Lips and gums are healthy appearing. There is no oropharyngeal erythema or exudate. Neck:  No thyromegaly, masses, tenderness noted.     Heart:  No gallop, murmur, click, rub .  Lungs:  without  rhonchi, rales, rubs. Abdomen: Bowel sounds are normal. Abdomen is soft and nontender with no organomegaly, hernias, masses. GU: Deferred  Extremities:  No cyanosis, clubbing, edema  Neurologic exam :Balance, Rhomberg, finger to nose testing could not be completed due to clinical state Skin: Warm & dry w/o tenting.  See summary under each active problem in the Problem List with associated updated therapeutic plan

## 2023-03-28 NOTE — Assessment & Plan Note (Signed)
Most recent renal function in July 2023 : creat 2.13 & GFR 31.Med list reviewed; no change unless CKD progressive on repeat.

## 2023-03-28 NOTE — Assessment & Plan Note (Signed)
Nutritionist is monitoring because of history of 10 pound weight loss over the previous 6 months.  The most recent albumin and total protein were performed in July 2023 revealing values of 2.9 and 6.1 respectively.  Labs will be updated.

## 2023-03-28 NOTE — Assessment & Plan Note (Signed)
No change clinically 

## 2023-03-30 ENCOUNTER — Other Ambulatory Visit (HOSPITAL_COMMUNITY)
Admission: RE | Admit: 2023-03-30 | Discharge: 2023-03-30 | Disposition: A | Discharge status: Home / Self Care | Payer: Medicare PPO | Source: Skilled Nursing Facility | Attending: Adult Health | Admitting: Adult Health

## 2023-03-30 DIAGNOSIS — I10 Essential (primary) hypertension: Secondary | ICD-10-CM | POA: Insufficient documentation

## 2023-03-30 DIAGNOSIS — E1151 Type 2 diabetes mellitus with diabetic peripheral angiopathy without gangrene: Secondary | ICD-10-CM | POA: Diagnosis not present

## 2023-03-30 LAB — COMPREHENSIVE METABOLIC PANEL
ALT: 23 U/L (ref 0–44)
AST: 22 U/L (ref 15–41)
Albumin: 3 g/dL — ABNORMAL LOW (ref 3.5–5.0)
Alkaline Phosphatase: 105 U/L (ref 38–126)
Anion gap: 10 (ref 5–15)
BUN: 34 mg/dL — ABNORMAL HIGH (ref 8–23)
CO2: 25 mmol/L (ref 22–32)
Calcium: 8.9 mg/dL (ref 8.9–10.3)
Chloride: 103 mmol/L (ref 98–111)
Creatinine, Ser: 1.63 mg/dL — ABNORMAL HIGH (ref 0.61–1.24)
GFR, Estimated: 43 mL/min — ABNORMAL LOW (ref >60)
Glucose, Bld: 123 mg/dL — ABNORMAL HIGH (ref 70–99)
Potassium: 3.9 mmol/L (ref 3.5–5.1)
Sodium: 138 mmol/L (ref 135–145)
Total Bilirubin: 0.6 mg/dL (ref 0.3–1.2)
Total Protein: 6.2 g/dL — ABNORMAL LOW (ref 6.5–8.1)

## 2023-03-30 LAB — CBC
HCT: 48.4 % (ref 39.0–52.0)
Hemoglobin: 15.8 g/dL (ref 13.0–17.0)
MCH: 30.4 pg (ref 26.0–34.0)
MCHC: 32.6 g/dL (ref 30.0–36.0)
MCV: 93.1 fL (ref 80.0–100.0)
Platelets: 182 10*3/uL (ref 150–400)
RBC: 5.2 MIL/uL (ref 4.22–5.81)
RDW: 12.5 % (ref 11.5–15.5)
WBC: 8.1 10*3/uL (ref 4.0–10.5)
nRBC: 0 % (ref 0.0–0.2)

## 2023-03-30 LAB — LIPID PANEL
Cholesterol: 98 mg/dL (ref 0–200)
HDL: 24 mg/dL — ABNORMAL LOW (ref >40)
LDL Cholesterol: 59 mg/dL (ref 0–99)
Total CHOL/HDL Ratio: 4.1 RATIO
Triglycerides: 77 mg/dL (ref <150)
VLDL: 15 mg/dL (ref 0–40)

## 2023-03-30 LAB — TSH: TSH: 1.28 u[IU]/mL (ref 0.350–4.500)

## 2023-03-30 LAB — HEMOGLOBIN A1C
Hgb A1c MFr Bld: 6.9 % — ABNORMAL HIGH (ref 4.8–5.6)
Mean Plasma Glucose: 151.33 mg/dL

## 2023-04-03 DIAGNOSIS — E1140 Type 2 diabetes mellitus with diabetic neuropathy, unspecified: Secondary | ICD-10-CM | POA: Diagnosis not present

## 2023-04-03 DIAGNOSIS — M2042 Other hammer toe(s) (acquired), left foot: Secondary | ICD-10-CM | POA: Diagnosis not present

## 2023-04-03 DIAGNOSIS — Z794 Long term (current) use of insulin: Secondary | ICD-10-CM | POA: Diagnosis not present

## 2023-04-03 DIAGNOSIS — B351 Tinea unguium: Secondary | ICD-10-CM | POA: Diagnosis not present

## 2023-04-03 LAB — HM DIABETES FOOT EXAM: HM Diabetic Foot Exam: 360

## 2023-04-03 NOTE — Progress Notes (Signed)
History of Present Illness: This is [name]'s first visit here since June, 2023.  Prior to that, he was catheter dependent, with changes on a regular basis.  He does have a history of prostate cancer, has had radiotherapy that was completed many years ago.  Has bladder neck contracture.  At the last visit we decided to try to leave the catheter out as he had significant pain and screaming with catheter changes.  He has had no problems to date.  He is dependent on diapers.  He does not ambulate.  He has not really had any bladder related issues since that visit.  Of the past 2 to 3 days he states that he feels like he cannot urinate.  No gross hematuria.  Some discomfort in his lower abdomen.  Nursing staff performing Crede maneuvers do get some urine out.  Past Medical History:  Diagnosis Date   A-fib    a. Post-op afib after CABG 08/2012.   Acute gastric ulcer with hemorrhage    Acute respiratory failure with hypoxia    Aphasia following cerebral infarction    CAD (coronary artery disease)    a. NSTEMI s/p stent to distal RCA 03/2000. b. Inf MI s/p emergent thrombectomy/stenting mid RCA 10/2000. c. NSTEMI s/p CABGx4 (LIMA-LAD, SVG-OM2, seq SVG-acute marginal and PD) 08/16/12 - course complicated by confusion, post-op AF, L pleural effusion with thoracentesis. d. Normal LV function by echo 08/2012.   Diverticulosis    DM with CKD    Dysphagia following cerebral infarction    Encephalopathy    Extended spectrum beta lactamase (ESBL) resistance    Generalized anxiety disorder    GERD (gastroesophageal reflux disease)    Hemiplegia and hemiparesis following cerebral infarction affecting right dominant side    Hiatal hernia    HLD (hyperlipidemia)    HTN (hypertension)    Non-ST elevation (NSTEMI) myocardial infarction    Nontraumatic intracerebral hemorrhage in hemisphere, subcortical    Peripheral vascular disease    a. Carotid dopplers neg 08/13/12. b. Pre-cabg ABIs - R=0.87 suggesting mild  dz, L=1.29 possibly falsely elevated due to calcified vessels.   Pleural effusion    a. L pleural eff after CABG s/p thoracentesis 08/22/12.   Prostate cancer    Status post radiation treatment.   Prostatic hypertrophy    a. Hx of urinary retention, awaiting TURP.   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 Mild  MR by TEE 08/2012.    Past Surgical History:  Procedure Laterality Date   APPENDECTOMY     BACK SURGERY     CORONARY ARTERY BYPASS GRAFT  08/16/2012   Procedure: CORONARY ARTERY BYPASS GRAFTING (CABG);  Surgeon: Loreli Slot, MD;  Location: [facility] OR;  Service: Open Heart Surgery;  Laterality: N/A;   IR REPLC GASTRO/COLONIC TUBE PERCUT W/FLUORO  02/13/2019   LEFT HEART CATHETERIZATION WITH CORONARY ANGIOGRAM N/A 08/14/2012   Procedure: LEFT HEART CATHETERIZATION WITH CORONARY ANGIOGRAM;  Surgeon: Peter M Swaziland, MD;  Location: [facility] CATH LAB;  Service: Cardiovascular;  Laterality: N/A;    Home Medications:  Allergies as of 04/04/2023   No Known Allergies      Medication List        Accurate as of April 03, 2023  7:59 PM. If you have any questions, ask your nurse or doctor.  acetaminophen 325 MG tablet Commonly known as: TYLENOL Take 650 mg by mouth every 4 (four) hours as needed. per standing order for pain/fever DO NOT EXCEED >3,000 mg in 24 hours   amLODipine 5 MG tablet Commonly known as: NORVASC Take 5 mg by mouth daily.   atorvastatin 40 MG tablet Commonly known as: LIPITOR Take 40 mg by mouth daily.   baclofen 10 MG tablet Commonly known as: LIORESAL Take 10 mg by mouth 3 (three) times daily.   BD AutoShield Duo 30G X 5 MM Misc Generic drug: Insulin Pen Needle 3/16"   cholecalciferol 25 MCG (1000 UNIT) tablet Commonly known as: VITAMIN D3 Take 1,000 Units by mouth daily.   feeding supplement (GLUCERNA SHAKE) Liqd Take by mouth  daily.   HumaLOG 100 UNIT/ML cartridge Generic drug: insulin lispro Inject 5 Units into the skin in the morning and at bedtime. Give 5 units  with breakfast and lunch give if he eats more than 50% of meals   insulin glargine 100 UNIT/ML injection Commonly known as: LANTUS Inject 20 Units into the skin daily.   lansoprazole 30 MG disintegrating tablet Commonly known as: PREVACID SOLUTAB Take 30 mg by mouth daily. Give before meals   loratadine 10 MG tablet Commonly known as: CLARITIN Take 10 mg by mouth daily.   losartan 25 MG tablet Commonly known as: COZAAR Take 25 mg by mouth daily. DX: Hypertensive heart and chronic kidney disease without heart failure, with stage 1 through stage 4 chronic kidney disease, or unspecified chronic kidney disease-per NP note, code updated   melatonin 1 MG Tabs tablet Take 2 tablets by mouth at bedtime.   NON FORMULARY Diet Change: Regular, thin liquids   ondansetron 4 MG disintegrating tablet Commonly known as: ZOFRAN-ODT Take 4 mg by mouth every 6 (six) hours as needed for nausea or vomiting.   PROzac 20 MG capsule Generic drug: FLUoxetine Take 20 mg by mouth daily.   rOPINIRole 0.25 MG tablet Commonly known as: REQUIP Take 0.25 mg by mouth at bedtime.   senna 8.6 MG tablet Commonly known as: SENOKOT Take 1 tablet by mouth 2 (two) times daily.   Venelex Oint Apply topically. Special Instructions: Apply to sacrum, coccyx and bilateral buttocks qshift for prevention/ redness. Every Shift Day, Evening, Night        Allergies: No Known Allergies  Family History  Problem Relation Age of Onset   Hypertension Mother     Social History:  reports that he quit smoking about 50 years ago. His smoking use included cigarettes. He has never used smokeless tobacco. He reports that he does not drink alcohol and does not use drugs.  ROS: A complete review of systems was performed.  All systems are negative except for pertinent findings  as noted.  Physical Exam:  Vital signs in last 24 hours: There were no vitals taken for this visit. Constitutional:  Alert and oriented, No acute distress in wheelchair, with wife at side. Cardiovascular: Regular rate  Respiratory: Normal respiratory effort Neurologic: Grossly intact, no focal deficits Psychiatric: Normal mood and affect  I have reviewed prior pt notes  I have reviewed notes from referring/previous physicians  I have reviewed prior urine culture  Bladder scan volume 0  I utilized a 16 [place] coud tip catheter to catheterize him for urine-only 2 to 3 cc obtained  Impression/Assessment:  1.  Remote history of prostate cancer, status post external beam radiotherapy.  Last PSA 2 years ago, 0.37, stable  2.  Urinary incontinence secondary to neurologic process.  I feel like he is emptying his bladder out.  Cath urine volume was negligible, bladder scan x 2 revealed 0 residual  3.  Possible history of UTI, current  Plan:  1.  Urine sent for culture today  2.  He should be started on Macrobid, 1 p.o. every 12 hours x 7 days  3.  I will have him come back in 3 months for check with bladder scan

## 2023-04-04 ENCOUNTER — Ambulatory Visit (INDEPENDENT_AMBULATORY_CARE_PROVIDER_SITE_OTHER): Payer: Medicare PPO | Admitting: Urology

## 2023-04-04 VITALS — BP 121/75 | HR 85

## 2023-04-04 DIAGNOSIS — N3000 Acute cystitis without hematuria: Secondary | ICD-10-CM

## 2023-04-04 DIAGNOSIS — R1030 Lower abdominal pain, unspecified: Secondary | ICD-10-CM | POA: Diagnosis not present

## 2023-04-04 DIAGNOSIS — R339 Retention of urine, unspecified: Secondary | ICD-10-CM

## 2023-04-04 DIAGNOSIS — Z87440 Personal history of urinary (tract) infections: Secondary | ICD-10-CM | POA: Diagnosis not present

## 2023-04-04 DIAGNOSIS — N39498 Other specified urinary incontinence: Secondary | ICD-10-CM | POA: Diagnosis not present

## 2023-04-04 DIAGNOSIS — Z8546 Personal history of malignant neoplasm of prostate: Secondary | ICD-10-CM | POA: Diagnosis not present

## 2023-04-04 DIAGNOSIS — Q549 Hypospadias, unspecified: Secondary | ICD-10-CM

## 2023-04-04 LAB — BLADDER SCAN AMB NON-IMAGING: Scan Result: 0

## 2023-04-04 MED ORDER — NITROFURANTOIN MONOHYD MACRO 100 MG PO CAPS: 100.0000 mg | ORAL_CAPSULE | Freq: Two times a day (BID) | ORAL | 0 refills | Status: AC

## 2023-04-04 NOTE — Progress Notes (Signed)
Pt here today for bladder scan. Bladder was scanned and 0 was visualized.    Performed by Hermon Zea, CMA  

## 2023-04-06 ENCOUNTER — Encounter: Payer: Self-pay | Admitting: Adult Health

## 2023-04-06 LAB — URINE CULTURE

## 2023-04-06 NOTE — Progress Notes (Signed)
Location:  [facility] Room Number: 130 Place of Service:  SNF (31)   CODE STATUS: dnr   No Known Allergies  Chief Complaint  Patient presents with   Acute Visit    Care plan meeting     HPI:  We have come together for his care plan meeting. Family present  BIMS no mood 0/30. He is nonambulatory with no falls. He is dependent assist for adl care. He is incontinent of bladder and bowel.  Dietary: regular diet weight is 171.5 pounds and stable; appetite 25-75%; set up for meals. Therapy: none at this time.  Activities: music programs 1:1. He will continue to be followed for his chronic illnesses including: Aortic atherosclerosis  Hypertension associated with diabetes mellitus type 2    Peripheral vascular disease  Past Medical History:  Diagnosis Date   A-fib    a. Post-op afib after CABG 08/2012.   Acute gastric ulcer with hemorrhage    Acute respiratory failure with hypoxia    Aphasia following cerebral infarction    CAD (coronary artery disease)    a. NSTEMI s/p stent to distal RCA 03/2000. b. Inf MI s/p emergent thrombectomy/stenting mid RCA 10/2000. c. NSTEMI s/p CABGx4 (LIMA-LAD, SVG-OM2, seq SVG-acute marginal and PD) 08/16/12 - course complicated by confusion, post-op AF, L pleural effusion with thoracentesis. d. Normal LV function by echo 08/2012.   Diverticulosis    DM with CKD    Dysphagia following cerebral infarction    Encephalopathy    Extended spectrum beta lactamase (ESBL) resistance    Generalized anxiety disorder    GERD (gastroesophageal reflux disease)    Hemiplegia and hemiparesis following cerebral infarction affecting right dominant side    Hiatal hernia    HLD (hyperlipidemia)    HTN (hypertension)    Non-ST elevation (NSTEMI) myocardial infarction    Nontraumatic intracerebral hemorrhage in hemisphere, subcortical    Peripheral vascular disease    a. Carotid dopplers neg 08/13/12. b. Pre-cabg ABIs - R=0.87 suggesting mild dz, L=1.29  possibly falsely elevated due to calcified vessels.   Pleural effusion    a. L pleural eff after CABG s/p thoracentesis 08/22/12.   Prostate cancer    Status post radiation treatment.   Prostatic hypertrophy    a. Hx of urinary retention, awaiting TURP.   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 Mild  MR by TEE 08/2012.    Past Surgical History:  Procedure Laterality Date   APPENDECTOMY     BACK SURGERY     CORONARY ARTERY BYPASS GRAFT  08/16/2012   Procedure: CORONARY ARTERY BYPASS GRAFTING (CABG);  Surgeon: Loreli Slot, MD;  Location: [facility] OR;  Service: Open Heart Surgery;  Laterality: N/A;   IR REPLC GASTRO/COLONIC TUBE PERCUT W/FLUORO  02/13/2019   LEFT HEART CATHETERIZATION WITH CORONARY ANGIOGRAM N/A 08/14/2012   Procedure: LEFT HEART CATHETERIZATION WITH CORONARY ANGIOGRAM;  Surgeon: Peter M Swaziland, MD;  Location: [facility] CATH LAB;  Service: Cardiovascular;  Laterality: N/A;    Social History   Socioeconomic History   Marital status: Married    Spouse name: Not on file   Number of children: Not on file   Years of education: Not on file   Highest education level: Not on file  Occupational History   Occupation: retired   Tobacco Use   Smoking status: Former  Types: Cigarettes    Quit date: 11/12/1972    Years since quitting: 50.4   Smokeless tobacco: Never  Vaping Use   Vaping Use: Never used  Substance and Sexual Activity   Alcohol use: No   Drug use: No   Sexual activity: Not Currently  Other Topics Concern   Not on file  Social History Narrative   He is a long term patient of [facility]    Social Determinants of Health   Financial Resource Strain: Low Risk  (07/01/2019)   Overall Financial Resource Strain (CARDIA)    Difficulty of Paying Living Expenses: Not hard at all  Food Insecurity: Unknown (07/01/2019)   Hunger Vital Sign    Worried About Running  Out of Food in the Last Year: Patient declined    Ran Out of Food in the Last Year: Patient declined  Transportation Needs: Unknown (07/01/2019)   PRAPARE - Administrator, Civil Service (Medical): Patient declined    Lack of Transportation (Non-Medical): Patient declined  Physical Activity: Inactive (10/30/2019)   Exercise Vital Sign    Days of Exercise per Week: 0 days    Minutes of Exercise per Session: 0 min  Stress: Unknown (07/01/2019)   [employer] of Occupational Health - Occupational Stress Questionnaire    Feeling of Stress : Patient declined  Social Connections: Unknown (07/01/2019)   Social Connection and Isolation Panel [NHANES]    Frequency of Communication with Friends and Family: Patient declined    Frequency of Social Gatherings with Friends and Family: Patient declined    Attends Religious Services: Patient declined    Database administrator or Organizations: Patient declined    Attends Banker Meetings: Patient declined    Marital Status: Patient declined  Intimate Partner Violence: Unknown (07/01/2019)   Humiliation, Afraid, Rape, and Kick questionnaire    Fear of Current or Ex-Partner: Patient declined    Emotionally Abused: Patient declined    Physically Abused: Patient declined    Sexually Abused: Patient declined   Family History  Problem Relation Age of Onset   Hypertension Mother       VITAL SIGNS BP 111/66   Pulse 65   Temp 97.6 F (36.4 C)   Resp 18   Ht 6' (1.829 m)   Wt 203 lb 3.2 oz (92.2 kg)   SpO2 95%   BMI 27.56 kg/m   Outpatient Encounter Medications as of 04/07/2023  Medication Sig   acetaminophen (TYLENOL) 325 MG tablet Take 650 mg by mouth every 4 (four) hours as needed. per standing order for pain/fever DO NOT EXCEED >3,000 mg in 24 hours   amLODipine (NORVASC) 5 MG tablet Take 5 mg by mouth daily.    atorvastatin (LIPITOR) 40 MG tablet Take 40 mg by mouth daily.   baclofen (LIORESAL) 10 MG tablet Take  10 mg by mouth 3 (three) times daily.   Balsam [place]-Castor Oil (VENELEX) OINT Apply topically. Special Instructions: Apply to sacrum, coccyx and bilateral buttocks qshift for prevention/ redness. Every Shift Day, Evening, Night   BD AUTOSHIELD DUO 30G X 5 MM MISC 3/16"   cholecalciferol (VITAMIN D3) 25 MCG (1000 UNIT) tablet Take 1,000 Units by mouth daily.   feeding supplement, GLUCERNA SHAKE, (GLUCERNA SHAKE) LIQD Take by mouth daily.   FLUoxetine (PROZAC) 20 MG capsule Take 20 mg by mouth daily.   insulin glargine (LANTUS) 100 UNIT/ML injection Inject 20 Units into the skin daily.   insulin lispro (HUMALOG) 100 UNIT/ML cartridge  Inject 5 Units into the skin in the morning and at bedtime. Give 5 units  with breakfast and lunch give if he eats more than 50% of meals   lansoprazole (PREVACID SOLUTAB) 30 MG disintegrating tablet Take 30 mg by mouth daily. Give before meals   loratadine (CLARITIN) 10 MG tablet Take 10 mg by mouth daily.   losartan (COZAAR) 25 MG tablet Take 25 mg by mouth daily. DX: Hypertensive heart and chronic kidney disease without heart failure, with stage 1 through stage 4 chronic kidney disease, or unspecified chronic kidney disease-per NP note, code updated   Melatonin 1 MG TABS Take 2 tablets by mouth at bedtime.    nitrofurantoin, macrocrystal-monohydrate, (MACROBID) 100 MG capsule Take 1 capsule (100 mg total) by mouth 2 (two) times daily for 7 days.   NON FORMULARY Diet Change: Regular, thin liquids   ondansetron (ZOFRAN-ODT) 4 MG disintegrating tablet Take 4 mg by mouth every 6 (six) hours as needed for nausea or vomiting.   rOPINIRole (REQUIP) 0.25 MG tablet Take 0.25 mg by mouth at bedtime.   senna (SENOKOT) 8.6 MG tablet Take 1 tablet by mouth 2 (two) times daily.   No facility-administered encounter medications on file as of 04/07/2023.     SIGNIFICANT DIAGNOSTIC EXAMS  PREVIOUS   07-27-22: dexa: t score -1.421  12-08-22: ct of head:  1. No acute  intracranial abnormality. 2. Unchanged encephalomalacia in the left basal ganglia with prominent low-density changes in the left periventricular and subcortical white matter.  NO NEW EXAMS   LABS REVIEWED PREVIOUS:     03-13-22: wbc 8.0; hgb 16.0; hct 51.2; mcv 993.6 plt 148; glucose 156; bun 47; creat 2.50; k+ 4.7; na++ 140; ca 8.7; GFR 26 CRP 6.4; d-dimer 0.41 03-21-22: crp: <0.5 06-27-22: wbc 7.7; hgb 15.4; hct 47.6; mcv 92.2 plt 159; glucose 151; bun 38; creat 2.13; k+ 4.1; na++ 142; ca 8.7; gfr 31; protein 6.1; albumin 2.9; hgb a1c 6.5; chol 103; ldl 65; trig 76; hdl 23  09-06-22; urine micro-albumin: 456.2 11-13-22: hgb A1c 7.5; ACR 382.3   NO NEW LABS.   Review of Systems  Reason unable to perform ROS: aphasia.    Physical Exam Constitutional:      General: He is not in acute distress.    Appearance: He is well-developed. He is not diaphoretic.  Neck:     Thyroid: No thyromegaly.  Cardiovascular:     Rate and Rhythm: Normal rate and regular rhythm.     Pulses: Normal pulses.     Heart sounds: Normal heart sounds.  Pulmonary:     Effort: Pulmonary effort is normal. No respiratory distress.     Breath sounds: Normal breath sounds.  Abdominal:     General: Bowel sounds are normal. There is no distension.     Palpations: Abdomen is soft.     Tenderness: There is no abdominal tenderness.  Musculoskeletal:     Cervical back: Neck supple.     Right lower leg: No edema.     Left lower leg: No edema.     Comments: Right hemiplegia   Lymphadenopathy:     Cervical: No cervical adenopathy.  Skin:    General: Skin is warm and dry.  Neurological:     Mental Status: He is alert. Mental status is at baseline.  Psychiatric:        Mood and Affect: Mood normal.       ASSESSMENT/ PLAN:  TODAY;   Aortic atherosclerosis Hypertension associated with  diabetes mellitus type 2 Peripheral vascular disease  Will continue current medications; will complete macrobid  Will continue  current plan of care Will continue to monitor his status.   Time spent with patient: 40 minutes: medications; dietary; plan of care.     Synthia Innocent NP [facility]  call [phone]

## 2023-04-07 ENCOUNTER — Non-Acute Institutional Stay (SKILLED_NURSING_FACILITY): Payer: Medicare PPO | Admitting: Adult Health

## 2023-04-07 DIAGNOSIS — E1159 Type 2 diabetes mellitus with other circulatory complications: Secondary | ICD-10-CM

## 2023-04-07 DIAGNOSIS — I152 Hypertension secondary to endocrine disorders: Secondary | ICD-10-CM | POA: Diagnosis not present

## 2023-04-07 DIAGNOSIS — I739 Peripheral vascular disease, unspecified: Secondary | ICD-10-CM

## 2023-04-07 DIAGNOSIS — I700 Atherosclerosis of aorta: Secondary | ICD-10-CM | POA: Diagnosis not present

## 2023-04-11 ENCOUNTER — Telehealth: Payer: Self-pay

## 2023-04-11 NOTE — Telephone Encounter (Signed)
-----   Message from Marcine Matar, MD sent at 04/11/2023 12:11 PM EDT ----- Let patient know that his urine culture was negative.  He has probably finished his Macrobid. ----- Message ----- From: Troy Sine, CMA Sent: 04/07/2023   3:33 PM EDT To: Marcine Matar, MD  Please review

## 2023-04-11 NOTE — Telephone Encounter (Signed)
Made patient's wife aware that of Dr. Retta Diones recommendation. Wife voiced understanding

## 2023-04-11 NOTE — Telephone Encounter (Signed)
-----   Message from Stephen Dahlstedt, MD sent at 04/11/2023 12:11 PM EDT ----- Let patient know that his urine culture was negative.  He has probably finished his Macrobid. ----- Message ----- From: Lillith Mcneff R, CMA Sent: 04/07/2023   3:33 PM EDT To: Stephen Dahlstedt, MD  Please review  

## 2023-04-11 NOTE — Telephone Encounter (Signed)
Tried calling patient with no answer, unable to leave voice message due to mailbox being full. Will follow back up

## 2023-04-12 DIAGNOSIS — F5105 Insomnia due to other mental disorder: Secondary | ICD-10-CM | POA: Diagnosis not present

## 2023-04-12 DIAGNOSIS — F333 Major depressive disorder, recurrent, severe with psychotic symptoms: Secondary | ICD-10-CM | POA: Diagnosis not present

## 2023-05-02 ENCOUNTER — Encounter: Payer: Self-pay | Admitting: Adult Health

## 2023-05-02 ENCOUNTER — Non-Acute Institutional Stay (SKILLED_NURSING_FACILITY): Payer: Medicare PPO | Admitting: Adult Health

## 2023-05-02 DIAGNOSIS — G2581 Restless legs syndrome: Secondary | ICD-10-CM

## 2023-05-02 DIAGNOSIS — E1151 Type 2 diabetes mellitus with diabetic peripheral angiopathy without gangrene: Secondary | ICD-10-CM

## 2023-05-02 DIAGNOSIS — G8111 Spastic hemiplegia affecting right dominant side: Secondary | ICD-10-CM | POA: Diagnosis not present

## 2023-05-02 DIAGNOSIS — I6930 Unspecified sequelae of cerebral infarction: Secondary | ICD-10-CM | POA: Diagnosis not present

## 2023-05-02 NOTE — Progress Notes (Signed)
Location:  [facility] Room Number: 130P Place of Service:  SNF (31)   CODE STATUS: DNR   No Known Allergies  Chief Complaint  Patient presents with   Medical Management of Chronic Issues                  Type 2 diabetes mellitus with peripheral vascular disease:  Protein calorie malnutrition severe: History of nontraumatic subcortical hemorrhage left cerebral hemisphere/right spastic hemiplegia Restless leg syndrome     HPI:  He is a 77 year old long term resident of this facility being seen for the management of his chronic illnesses: Type 2 diabetes mellitus with peripheral vascular disease:  Protein calorie malnutrition severe: History of nontraumatic subcortical hemorrhage left cerebral hemisphere/right spastic hemiplegia Restless leg syndrome. There are no reports of uncontrolled pain. He is losing weight.   Past Medical History:  Diagnosis Date   A-fib [facility])    a. Post-op afib after CABG 08/2012.   Acute gastric ulcer with hemorrhage    Acute respiratory failure with hypoxia (HCC)    Aphasia following cerebral infarction    CAD (coronary artery disease)    a. NSTEMI s/p stent to distal RCA 03/2000. b. Inf MI s/p emergent thrombectomy/stenting mid RCA 10/2000. c. NSTEMI s/p CABGx4 (LIMA-LAD, SVG-OM2, seq SVG-acute marginal and PD) 08/16/12 - course complicated by confusion, post-op AF, L pleural effusion with thoracentesis. d. Normal LV function by echo 08/2012.   Diverticulosis    DM with CKD    Dysphagia following cerebral infarction    Encephalopathy    Extended spectrum beta lactamase (ESBL) resistance    Generalized anxiety disorder    GERD (gastroesophageal reflux disease)    Hemiplegia and hemiparesis following cerebral infarction affecting right dominant side (HCC)    Hiatal hernia    HLD (hyperlipidemia)    HTN (hypertension)    Non-ST elevation (NSTEMI) myocardial infarction [facility])    Nontraumatic intracerebral hemorrhage in hemisphere,  subcortical [facility])    Peripheral vascular disease (HCC)    a. Carotid dopplers neg 08/13/12. b. Pre-cabg ABIs - R=0.87 suggesting mild dz, L=1.29 possibly falsely elevated due to calcified vessels.   Pleural effusion    a. L pleural eff after CABG s/p thoracentesis 08/22/12.   Prostate cancer [facility])    Status post radiation treatment.   Prostatic hypertrophy    a. Hx of urinary retention, awaiting TURP.   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 Mild  MR by TEE 08/2012.    Past Surgical History:  Procedure Laterality Date   APPENDECTOMY     BACK SURGERY     CORONARY ARTERY BYPASS GRAFT  08/16/2012   Procedure: CORONARY ARTERY BYPASS GRAFTING (CABG);  Surgeon: Loreli Slot, MD;  Location: [facility] OR;  Service: Open Heart Surgery;  Laterality: N/A;   IR REPLC GASTRO/COLONIC TUBE PERCUT W/FLUORO  02/13/2019   LEFT HEART CATHETERIZATION WITH CORONARY ANGIOGRAM N/A 08/14/2012   Procedure: LEFT HEART CATHETERIZATION WITH CORONARY ANGIOGRAM;  Surgeon: Peter M Swaziland, MD;  Location: [facility] CATH LAB;  Service: Cardiovascular;  Laterality: N/A;    Social History   Socioeconomic History   Marital status: Married    Spouse name: Not on file   Number of children: Not on file   Years of education: Not on file   Highest education level: Not  on file  Occupational History   Occupation: retired   Tobacco Use   Smoking status: Former    Types: Cigarettes    Quit date: 11/12/1972    Years since quitting: 50.5   Smokeless tobacco: Never  Vaping Use   Vaping Use: Never used  Substance and Sexual Activity   Alcohol use: No   Drug use: No   Sexual activity: Not Currently  Other Topics Concern   Not on file  Social History Narrative   He is a long term patient of [facility]    Social Determinants of Health   Financial Resource Strain: Low Risk  (07/01/2019)   Overall Financial  Resource Strain (CARDIA)    Difficulty of Paying Living Expenses: Not hard at all  Food Insecurity: Unknown (07/01/2019)   Hunger Vital Sign    Worried About Running Out of Food in the Last Year: Patient declined    Ran Out of Food in the Last Year: Patient declined  Transportation Needs: Unknown (07/01/2019)   PRAPARE - Administrator, Civil Service (Medical): Patient declined    Lack of Transportation (Non-Medical): Patient declined  Physical Activity: Inactive (10/30/2019)   Exercise Vital Sign    Days of Exercise per Week: 0 days    Minutes of Exercise per Session: 0 min  Stress: Unknown (07/01/2019)   [employer] of Occupational Health - Occupational Stress Questionnaire    Feeling of Stress : Patient declined  Social Connections: Unknown (07/01/2019)   Social Connection and Isolation Panel [NHANES]    Frequency of Communication with Friends and Family: Patient declined    Frequency of Social Gatherings with Friends and Family: Patient declined    Attends Religious Services: Patient declined    Database administrator or Organizations: Patient declined    Attends Banker Meetings: Patient declined    Marital Status: Patient declined  Intimate Partner Violence: Unknown (07/01/2019)   Humiliation, Afraid, Rape, and Kick questionnaire    Fear of Current or Ex-Partner: Patient declined    Emotionally Abused: Patient declined    Physically Abused: Patient declined    Sexually Abused: Patient declined   Family History  Problem Relation Age of Onset   Hypertension Mother       VITAL SIGNS BP 134/75   Pulse 71   Temp 99.2 F (37.3 C) (Temporal)   Resp 18   Ht 6' (1.829 m)   Wt 168 lb 9.6 oz (76.5 kg)   SpO2 95%   BMI 22.87 kg/m   Outpatient Encounter Medications as of 05/02/2023  Medication Sig   acetaminophen (TYLENOL) 325 MG tablet Take 650 mg by mouth every 4 (four) hours as needed. per standing order for pain/fever DO NOT EXCEED >3,000 mg  in 24 hours   amLODipine (NORVASC) 5 MG tablet Take 5 mg by mouth daily.    atorvastatin (LIPITOR) 40 MG tablet Take 40 mg by mouth daily.   baclofen (LIORESAL) 10 MG tablet Take 10 mg by mouth 3 (three) times daily.   Balsam [place]-Castor Oil (VENELEX) OINT Apply topically. Special Instructions: Apply to sacrum, coccyx and bilateral buttocks qshift for prevention/ redness. Every Shift Day, Evening, Night   BD AUTOSHIELD DUO 30G X 5 MM MISC 3/16"   cholecalciferol (VITAMIN D3) 25 MCG (1000 UNIT) tablet Take 1,000 Units by mouth daily.   feeding supplement, GLUCERNA SHAKE, (GLUCERNA SHAKE) LIQD Take by mouth daily.   FLUoxetine (PROZAC) 10 MG tablet Take 10 mg by mouth daily.  FLUoxetine (PROZAC) 20 MG tablet Take 20 mg by mouth daily.   insulin glargine (LANTUS) 100 UNIT/ML injection Inject 20 Units into the skin daily.   insulin lispro (HUMALOG) 100 UNIT/ML cartridge Inject 5 Units into the skin in the morning and at bedtime. Give 5 units  with breakfast and lunch give if he eats more than 50% of meals   lansoprazole (PREVACID SOLUTAB) 30 MG disintegrating tablet Take 30 mg by mouth daily. Give before meals   loratadine (CLARITIN) 10 MG tablet Take 10 mg by mouth daily.   losartan (COZAAR) 25 MG tablet Take 25 mg by mouth daily. DX: Hypertensive heart and chronic kidney disease without heart failure, with stage 1 through stage 4 chronic kidney disease, or unspecified chronic kidney disease-per NP note, code updated   melatonin 5 MG TABS Take 5 mg by mouth at bedtime.   NON FORMULARY Diet Change: Regular, thin liquids   ondansetron (ZOFRAN-ODT) 4 MG disintegrating tablet Take 4 mg by mouth every 6 (six) hours as needed for nausea or vomiting.   rOPINIRole (REQUIP) 0.25 MG tablet Take 0.25 mg by mouth at bedtime.   senna (SENOKOT) 8.6 MG tablet Take 1 tablet by mouth 2 (two) times daily.   [DISCONTINUED] Melatonin 1 MG TABS Take 2 tablets by mouth at bedtime.    [DISCONTINUED] FLUoxetine  (PROZAC) 20 MG capsule Take 20 mg by mouth daily.   No facility-administered encounter medications on file as of 05/02/2023.     SIGNIFICANT DIAGNOSTIC EXAMS   PREVIOUS   07-27-22: dexa: t score -1.421  12-08-22: ct of head:  1. No acute intracranial abnormality. 2. Unchanged encephalomalacia in the left basal ganglia with prominent low-density changes in the left periventricular and subcortical white matter.  NO NEW EXAMS   LABS REVIEWED PREVIOUS:     06-27-22: wbc 7.7; hgb 15.4; hct 47.6; mcv 92.2 plt 159; glucose 151; bun 38; creat 2.13; k+ 4.1; na++ 142; ca 8.7; gfr 31; protein 6.1; albumin 2.9; hgb a1c 6.5; chol 103; ldl 65; trig 76; hdl 23  09-06-22; urine micro-albumin: 456.2 11-13-22: hgb A1c 7.5; ACR 382.3   TODAY  03-30-23: wbc 8.1; hgb 15.8; hct 48.4 mcv 93.1 plt 182; glucose 123; bun 34; creat 1.63; k+ 3.9; na++ 138; ca 8.9; gfr 43; protein 6.2 albumin 3.0; hgb A1c 6.9; chol 98; ldl 59; trig 77; ldl 24; tsh 1.280   Review of Systems  Reason unable to perform ROS: aphasia.   Physical Exam Constitutional:      General: He is not in acute distress.    Appearance: He is well-developed. He is not diaphoretic.  Neck:     Thyroid: No thyromegaly.  Cardiovascular:     Rate and Rhythm: Normal rate and regular rhythm.     Pulses: Normal pulses.     Heart sounds: Normal heart sounds.  Pulmonary:     Effort: Pulmonary effort is normal. No respiratory distress.     Breath sounds: Normal breath sounds.  Abdominal:     General: Bowel sounds are normal. There is no distension.     Palpations: Abdomen is soft.     Tenderness: There is no abdominal tenderness.  Musculoskeletal:     Cervical back: Neck supple.     Right lower leg: No edema.     Left lower leg: No edema.     Comments: Right hemiplegia   Lymphadenopathy:     Cervical: No cervical adenopathy.  Skin:    General: Skin is warm and  dry.  Neurological:     Mental Status: He is alert. Mental status is at  baseline.  Psychiatric:        Mood and Affect: Mood normal.     ASSESSMENT/ PLAN:  TODAY:   Type 2 diabetes mellitus with peripheral vascular disease: hgb A1c 6.9; will continue humalog 5 units with meals twice daily; lantus 20 units nightly is on statin and arb  2. Protein calorie malnutrition severe: weight is 168 pounds; albumin 3.0 will continue supplements as directed   3. History of nontraumatic subcortical hemorrhage left cerebral hemisphere/right spastic hemiplegia (05-22-21) is on baclofen 10 mg three times daily for spasticity  4. Restless leg syndrome: is on requip 0.25 mg daily    PREVIOUS   5. Aortic atherosclerosis (ct 01-04-19) is on statin  6. History of dvt: has completed eliquis therapy  7. CAD native heart without angina: s/p cabg 2012; will continue cozaar 25 mg daily   8. Hypertension associated with type 2 diabetes mellitus: b/p 134/75  norvasc 5 mg daily and cozaar 25 mg daily  9. Osteopenia: t score -1.421 is on calcium and vitamin D supplements  10. Dyslipidemia associated with type 2 diabetes mellitus: ldl 57 will continue lipitor 40 mg daily   11. Micro-albuminuria due to type 2 diabetes mellitus: 382.3 is on ARB  12. Major depression with psychotic features: is taking prozac 10 mg daily   13. Chronic constipation: is on senna s twice daily   14. Chronic urine retention/bladder spasms: foley is out; not on medications  15. GERD without esophagitis: will continue prevacid 30 mg daily   16. Chronic non-seasonal allergic rhinitis: will continue claritin 10 mg daily   17. CKD stage 3 due to type 2 diabetes mellitus: bun 34; creat 1.63; gfr 43     Synthia Innocent NP [facility]   call [phone]

## 2023-05-04 ENCOUNTER — Other Ambulatory Visit (HOSPITAL_COMMUNITY)
Admission: RE | Admit: 2023-05-04 | Discharge: 2023-05-04 | Disposition: A | Discharge status: Home / Self Care | Payer: Medicare PPO | Source: Skilled Nursing Facility | Attending: Adult Health | Admitting: Adult Health

## 2023-05-04 DIAGNOSIS — R300 Dysuria: Secondary | ICD-10-CM | POA: Diagnosis not present

## 2023-05-04 LAB — URINALYSIS, W/ REFLEX TO CULTURE (INFECTION SUSPECTED)
Bilirubin Urine: NEGATIVE
Glucose, UA: NEGATIVE mg/dL
Ketones, ur: NEGATIVE mg/dL
Nitrite: NEGATIVE
Protein, ur: 100 mg/dL — AB
Specific Gravity, Urine: 1.016 (ref 1.005–1.030)
WBC, UA: >50 WBC/hpf (ref 0–5)
pH: 5 (ref 5.0–8.0)

## 2023-05-29 ENCOUNTER — Non-Acute Institutional Stay (SKILLED_NURSING_FACILITY): Payer: Medicare PPO | Admitting: Adult Health

## 2023-05-29 DIAGNOSIS — F0150 Vascular dementia without behavioral disturbance: Secondary | ICD-10-CM

## 2023-05-29 DIAGNOSIS — R52 Pain, unspecified: Secondary | ICD-10-CM | POA: Diagnosis not present

## 2023-05-29 DIAGNOSIS — I6930 Unspecified sequelae of cerebral infarction: Secondary | ICD-10-CM | POA: Diagnosis not present

## 2023-05-30 ENCOUNTER — Encounter: Payer: Self-pay | Admitting: Adult Health

## 2023-05-30 DIAGNOSIS — R52 Pain, unspecified: Secondary | ICD-10-CM | POA: Insufficient documentation

## 2023-05-30 NOTE — Progress Notes (Signed)
Location:  [facility] Room Number: 130 Place of Service:  SNF (31)   CODE STATUS: dnr  No Known Allergies  Chief Complaint  Patient presents with   Acute Visit    Family concerns     HPI:  Over this past week he has been less responsive. He has been eating and drinking less. She was concerned that he was declining once again. Today he is awake and verbal. Is complaining of pain "all over". He  is back to eating and drinking  and is back to his baseline.  I have had a discussion with his wife regarding his overall health status. She verbalized understanding that he will have periods of time where he will be less responsive.   Past Medical History:  Diagnosis Date   A-fib [facility])    a. Post-op afib after CABG 08/2012.   Acute gastric ulcer with hemorrhage    Acute respiratory failure with hypoxia (HCC)    Aphasia following cerebral infarction    CAD (coronary artery disease)    a. NSTEMI s/p stent to distal RCA 03/2000. b. Inf MI s/p emergent thrombectomy/stenting mid RCA 10/2000. c. NSTEMI s/p CABGx4 (LIMA-LAD, SVG-OM2, seq SVG-acute marginal and PD) 08/16/12 - course complicated by confusion, post-op AF, L pleural effusion with thoracentesis. d. Normal LV function by echo 08/2012.   Diverticulosis    DM with CKD    Dysphagia following cerebral infarction    Encephalopathy    Extended spectrum beta lactamase (ESBL) resistance    Generalized anxiety disorder    GERD (gastroesophageal reflux disease)    Hemiplegia and hemiparesis following cerebral infarction affecting right dominant side (HCC)    Hiatal hernia    HLD (hyperlipidemia)    HTN (hypertension)    Non-ST elevation (NSTEMI) myocardial infarction [facility])    Nontraumatic intracerebral hemorrhage in hemisphere, subcortical [facility])    Peripheral vascular disease (HCC)    a. Carotid dopplers neg 08/13/12. b. Pre-cabg ABIs - R=0.87 suggesting mild dz, L=1.29 possibly falsely elevated due to calcified vessels.    Pleural effusion    a. L pleural eff after CABG s/p thoracentesis 08/22/12.   Prostate cancer [facility])    Status post radiation treatment.   Prostatic hypertrophy    a. Hx of urinary retention, awaiting TURP.   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 Mild  MR by TEE 08/2012.    Past Surgical History:  Procedure Laterality Date   APPENDECTOMY     BACK SURGERY     CORONARY ARTERY BYPASS GRAFT  08/16/2012   Procedure: CORONARY ARTERY BYPASS GRAFTING (CABG);  Surgeon: Loreli Slot, MD;  Location: [facility] OR;  Service: Open Heart Surgery;  Laterality: N/A;   IR REPLC GASTRO/COLONIC TUBE PERCUT W/FLUORO  02/13/2019   LEFT HEART CATHETERIZATION WITH CORONARY ANGIOGRAM N/A 08/14/2012   Procedure: LEFT HEART CATHETERIZATION WITH CORONARY ANGIOGRAM;  Surgeon: Peter M Swaziland, MD;  Location: [facility] CATH LAB;  Service: Cardiovascular;  Laterality: N/A;    Social History   Socioeconomic History   Marital status: Married    Spouse name: Not on file   Number of children: Not on file   Years of education: Not on file   Highest education level: Not on file  Occupational History   Occupation: retired   Tobacco Use   Smoking status:  Former    Types: Cigarettes    Quit date: 11/12/1972    Years since quitting: 50.5   Smokeless tobacco: Never  Vaping Use   Vaping Use: Never used  Substance and Sexual Activity   Alcohol use: No   Drug use: No   Sexual activity: Not Currently  Other Topics Concern   Not on file  Social History Narrative   He is a long term patient of [facility]    Social Determinants of Health   Financial Resource Strain: Low Risk  (07/01/2019)   Overall Financial Resource Strain (CARDIA)    Difficulty of Paying Living Expenses: Not hard at all  Food Insecurity: Unknown (07/01/2019)   Hunger Vital Sign    Worried About Running Out of Food in the Last Year:  Patient declined    Ran Out of Food in the Last Year: Patient declined  Transportation Needs: Unknown (07/01/2019)   PRAPARE - Administrator, Civil Service (Medical): Patient declined    Lack of Transportation (Non-Medical): Patient declined  Physical Activity: Inactive (10/30/2019)   Exercise Vital Sign    Days of Exercise per Week: 0 days    Minutes of Exercise per Session: 0 min  Stress: Unknown (07/01/2019)   [employer] of Occupational Health - Occupational Stress Questionnaire    Feeling of Stress : Patient declined  Social Connections: Unknown (07/01/2019)   Social Connection and Isolation Panel [NHANES]    Frequency of Communication with Friends and Family: Patient declined    Frequency of Social Gatherings with Friends and Family: Patient declined    Attends Religious Services: Patient declined    Database administrator or Organizations: Patient declined    Attends Banker Meetings: Patient declined    Marital Status: Patient declined  Intimate Partner Violence: Unknown (07/01/2019)   Humiliation, Afraid, Rape, and Kick questionnaire    Fear of Current or Ex-Partner: Patient declined    Emotionally Abused: Patient declined    Physically Abused: Patient declined    Sexually Abused: Patient declined   Family History  Problem Relation Age of Onset   Hypertension Mother       VITAL SIGNS BP 118/62   Pulse 70   Temp (!) 97.5 F (36.4 C)   Resp 18   Ht 5\' 11"  (1.803 m)   Wt 172 lb 12.8 oz (78.4 kg)   SpO2 98%   BMI 24.10 kg/m   Outpatient Encounter Medications as of 05/29/2023  Medication Sig   acetaminophen (TYLENOL) 325 MG tablet Take 650 mg by mouth every 4 (four) hours as needed. per standing order for pain/fever DO NOT EXCEED >3,000 mg in 24 hours   amLODipine (NORVASC) 5 MG tablet Take 5 mg by mouth daily.    atorvastatin (LIPITOR) 40 MG tablet Take 40 mg by mouth daily.   baclofen (LIORESAL) 10 MG tablet Take 10 mg by mouth 3  (three) times daily.   Balsam [place]-Castor Oil (VENELEX) OINT Apply topically. Special Instructions: Apply to sacrum, coccyx and bilateral buttocks qshift for prevention/ redness. Every Shift Day, Evening, Night   BD AUTOSHIELD DUO 30G X 5 MM MISC 3/16"   cholecalciferol (VITAMIN D3) 25 MCG (1000 UNIT) tablet Take 1,000 Units by mouth daily.   feeding supplement, GLUCERNA SHAKE, (GLUCERNA SHAKE) LIQD Take by mouth daily.   FLUoxetine (PROZAC) 10 MG tablet Take 10 mg by mouth daily.   FLUoxetine (PROZAC) 20 MG tablet Take 20 mg by mouth daily.   insulin glargine (  LANTUS) 100 UNIT/ML injection Inject 20 Units into the skin daily.   insulin lispro (HUMALOG) 100 UNIT/ML cartridge Inject 5 Units into the skin in the morning and at bedtime. Give 5 units  with breakfast and lunch give if he eats more than 50% of meals   lansoprazole (PREVACID SOLUTAB) 30 MG disintegrating tablet Take 30 mg by mouth daily. Give before meals   loratadine (CLARITIN) 10 MG tablet Take 10 mg by mouth daily.   losartan (COZAAR) 25 MG tablet Take 25 mg by mouth daily. DX: Hypertensive heart and chronic kidney disease without heart failure, with stage 1 through stage 4 chronic kidney disease, or unspecified chronic kidney disease-per NP note, code updated   melatonin 5 MG TABS Take 5 mg by mouth at bedtime.   NON FORMULARY Diet Change: Regular, thin liquids   ondansetron (ZOFRAN-ODT) 4 MG disintegrating tablet Take 4 mg by mouth every 6 (six) hours as needed for nausea or vomiting.   rOPINIRole (REQUIP) 0.25 MG tablet Take 0.25 mg by mouth at bedtime.   senna (SENOKOT) 8.6 MG tablet Take 1 tablet by mouth 2 (two) times daily.   No facility-administered encounter medications on file as of 05/29/2023.     SIGNIFICANT DIAGNOSTIC EXAMS  PREVIOUS   07-27-22: dexa: t score -1.421  12-08-22: ct of head:  1. No acute intracranial abnormality. 2. Unchanged encephalomalacia in the left basal ganglia with prominent low-density  changes in the left periventricular and subcortical white matter.  NO NEW EXAMS   LABS REVIEWED PREVIOUS:     06-27-22: wbc 7.7; hgb 15.4; hct 47.6; mcv 92.2 plt 159; glucose 151; bun 38; creat 2.13; k+ 4.1; na++ 142; ca 8.7; gfr 31; protein 6.1; albumin 2.9; hgb a1c 6.5; chol 103; ldl 65; trig 76; hdl 23  09-06-22; urine micro-albumin: 456.2 11-13-22: hgb A1c 7.5; ACR 382.3  03-30-23: wbc 8.1; hgb 15.8; hct 48.4 mcv 93.1 plt 182; glucose 123; bun 34; creat 1.63; k+ 3.9; na++ 138; ca 8.9; gfr 43; protein 6.2 albumin 3.0; hgb A1c 6.9; chol 98; ldl 59; trig 77; ldl 24; tsh 1.280  NO NEW LABS  Review of Systems  Reason unable to perform ROS: aphasia.     Physical Exam Constitutional:      General: He is not in acute distress.    Appearance: He is well-developed. He is not diaphoretic.  Neck:     Thyroid: No thyromegaly.  Cardiovascular:     Rate and Rhythm: Normal rate and regular rhythm.     Pulses: Normal pulses.     Heart sounds: Normal heart sounds.  Pulmonary:     Effort: Pulmonary effort is normal. No respiratory distress.     Breath sounds: Normal breath sounds.  Abdominal:     General: Bowel sounds are normal. There is no distension.     Palpations: Abdomen is soft.     Tenderness: There is no abdominal tenderness.  Musculoskeletal:     Cervical back: Neck supple.     Right lower leg: No edema.     Left lower leg: No edema.     Comments: Right hemiplegia   Lymphadenopathy:     Cervical: No cervical adenopathy.  Skin:    General: Skin is warm and dry.  Neurological:     Mental Status: He is alert. Mental status is at baseline.  Psychiatric:        Mood and Affect: Mood normal.      ASSESSMENT/ PLAN:  TODAY  History of  hemorrhagic cerebrovascular accident (CVA) Vascular dementia without behavioral disturbance Generalized pain  Will begin tylenol 650 mg twice daily will monitor his status.    Synthia Innocent NP [facility]  call [phone]

## 2023-05-31 ENCOUNTER — Non-Acute Institutional Stay (SKILLED_NURSING_FACILITY): Payer: Medicare PPO | Admitting: Adult Health

## 2023-05-31 ENCOUNTER — Encounter: Payer: Self-pay | Admitting: Adult Health

## 2023-05-31 DIAGNOSIS — I152 Hypertension secondary to endocrine disorders: Secondary | ICD-10-CM | POA: Diagnosis not present

## 2023-05-31 DIAGNOSIS — I2510 Atherosclerotic heart disease of native coronary artery without angina pectoris: Secondary | ICD-10-CM | POA: Diagnosis not present

## 2023-05-31 DIAGNOSIS — I700 Atherosclerosis of aorta: Secondary | ICD-10-CM

## 2023-05-31 DIAGNOSIS — E1159 Type 2 diabetes mellitus with other circulatory complications: Secondary | ICD-10-CM | POA: Diagnosis not present

## 2023-05-31 NOTE — Progress Notes (Unsigned)
Location:  [facility] Room Number: 130P Place of Service:  SNF (31)   CODE STATUS: DNR  No Known Allergies  Chief Complaint  Patient presents with   Medical Management of Chronic Issues                   Aortic atherosclerosis:  CAD native heart without angina:  Hypertension associated with type 2 diabetes mellitus     HPI:  He is a 77 year old long term resident of this facility being seen for the management of his chronic illnesses:   Aortic atherosclerosis:  CAD native heart without angina:  Hypertension associated with type 2 diabetes mellitus. There are no reports of uncontrolled pain. His weight is stable. He has not had any further reports of increased lethargy.   Past Medical History:  Diagnosis Date   A-fib [facility])    a. Post-op afib after CABG 08/2012.   Acute gastric ulcer with hemorrhage    Acute respiratory failure with hypoxia (HCC)    Aphasia following cerebral infarction    CAD (coronary artery disease)    a. NSTEMI s/p stent to distal RCA 03/2000. b. Inf MI s/p emergent thrombectomy/stenting mid RCA 10/2000. c. NSTEMI s/p CABGx4 (LIMA-LAD, SVG-OM2, seq SVG-acute marginal and PD) 08/16/12 - course complicated by confusion, post-op AF, L pleural effusion with thoracentesis. d. Normal LV function by echo 08/2012.   Diverticulosis    DM with CKD    Dysphagia following cerebral infarction    Encephalopathy    Extended spectrum beta lactamase (ESBL) resistance    Generalized anxiety disorder    GERD (gastroesophageal reflux disease)    Hemiplegia and hemiparesis following cerebral infarction affecting right dominant side (HCC)    Hiatal hernia    HLD (hyperlipidemia)    HTN (hypertension)    Non-ST elevation (NSTEMI) myocardial infarction [facility])    Nontraumatic intracerebral hemorrhage in hemisphere, subcortical [facility])    Peripheral vascular disease (HCC)    a. Carotid dopplers neg 08/13/12. b. Pre-cabg ABIs - R=0.87 suggesting mild dz, L=1.29  possibly falsely elevated due to calcified vessels.   Pleural effusion    a. L pleural eff after CABG s/p thoracentesis 08/22/12.   Prostate cancer [facility])    Status post radiation treatment.   Prostatic hypertrophy    a. Hx of urinary retention, awaiting TURP.   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 Mild  MR by TEE 08/2012.    Past Surgical History:  Procedure Laterality Date   APPENDECTOMY     BACK SURGERY     CORONARY ARTERY BYPASS GRAFT  08/16/2012   Procedure: CORONARY ARTERY BYPASS GRAFTING (CABG);  Surgeon: Loreli Slot, MD;  Location: [facility] OR;  Service: Open Heart Surgery;  Laterality: N/A;   IR REPLC GASTRO/COLONIC TUBE PERCUT W/FLUORO  02/13/2019   LEFT HEART CATHETERIZATION WITH CORONARY ANGIOGRAM N/A 08/14/2012   Procedure: LEFT HEART CATHETERIZATION WITH CORONARY ANGIOGRAM;  Surgeon: Peter M Swaziland, MD;  Location: [facility] CATH LAB;  Service: Cardiovascular;  Laterality: N/A;    Social History   Socioeconomic History   Marital status: Married    Spouse name: Not on file   Number of children: Not on file   Years of education: Not on file   Highest education level: Not on file  Occupational History   Occupation:  retired   Tobacco Use   Smoking status: Former    Types: Cigarettes    Quit date: 11/12/1972    Years since quitting: 50.5   Smokeless tobacco: Never  Vaping Use   Vaping Use: Never used  Substance and Sexual Activity   Alcohol use: No   Drug use: No   Sexual activity: Not Currently  Other Topics Concern   Not on file  Social History Narrative   He is a long term patient of [facility]    Social Determinants of Health   Financial Resource Strain: Low Risk  (07/01/2019)   Overall Financial Resource Strain (CARDIA)    Difficulty of Paying Living Expenses: Not hard at all  Food Insecurity: Unknown (07/01/2019)   Hunger Vital Sign     Worried About Running Out of Food in the Last Year: Patient declined    Ran Out of Food in the Last Year: Patient declined  Transportation Needs: Unknown (07/01/2019)   PRAPARE - Administrator, Civil Service (Medical): Patient declined    Lack of Transportation (Non-Medical): Patient declined  Physical Activity: Inactive (10/30/2019)   Exercise Vital Sign    Days of Exercise per Week: 0 days    Minutes of Exercise per Session: 0 min  Stress: Unknown (07/01/2019)   [employer] of Occupational Health - Occupational Stress Questionnaire    Feeling of Stress : Patient declined  Social Connections: Unknown (07/01/2019)   Social Connection and Isolation Panel [NHANES]    Frequency of Communication with Friends and Family: Patient declined    Frequency of Social Gatherings with Friends and Family: Patient declined    Attends Religious Services: Patient declined    Database administrator or Organizations: Patient declined    Attends Banker Meetings: Patient declined    Marital Status: Patient declined  Intimate Partner Violence: Unknown (07/01/2019)   Humiliation, Afraid, Rape, and Kick questionnaire    Fear of Current or Ex-Partner: Patient declined    Emotionally Abused: Patient declined    Physically Abused: Patient declined    Sexually Abused: Patient declined   Family History  Problem Relation Age of Onset   Hypertension Mother       VITAL SIGNS BP 118/62   Pulse 70   Temp (!) 97.5 F (36.4 C)   Resp 20   Ht 5\' 11"  (1.803 m)   Wt 172 lb 12.8 oz (78.4 kg)   SpO2 94%   BMI 24.10 kg/m   Outpatient Encounter Medications as of 05/31/2023  Medication Sig   acetaminophen (TYLENOL) 325 MG tablet Take 650 mg by mouth in the morning and at bedtime.   amLODipine (NORVASC) 5 MG tablet Take 5 mg by mouth daily.    atorvastatin (LIPITOR) 40 MG tablet Take 40 mg by mouth daily.   baclofen (LIORESAL) 10 MG tablet Take 10 mg by mouth 3 (three) times daily.    BD AUTOSHIELD DUO 30G X 5 MM MISC 3/16"   cholecalciferol (VITAMIN D3) 25 MCG (1000 UNIT) tablet Take 1,000 Units by mouth daily.   FLUoxetine (PROZAC) 10 MG tablet Take 10 mg by mouth daily.   FLUoxetine (PROZAC) 20 MG tablet Take 20 mg by mouth daily.   insulin glargine (LANTUS) 100 UNIT/ML injection Inject 20 Units into the skin daily.   insulin lispro (HUMALOG) 100 UNIT/ML cartridge Inject 5 Units into the skin in the morning and at bedtime. Give 5 units  with breakfast and lunch give if he eats more  than 50% of meals   lansoprazole (PREVACID SOLUTAB) 30 MG disintegrating tablet Take 30 mg by mouth daily. Give before meals   loratadine (CLARITIN) 10 MG tablet Take 10 mg by mouth daily.   losartan (COZAAR) 25 MG tablet Take 25 mg by mouth daily. DX: Hypertensive heart and chronic kidney disease without heart failure, with stage 1 through stage 4 chronic kidney disease, or unspecified chronic kidney disease-per NP note, code updated   melatonin 5 MG TABS Take 5 mg by mouth at bedtime.   NON FORMULARY Diet Change: Regular, thin liquids   Nutritional Supplements (ENSURE ENLIVE PO) Take by mouth daily.   rOPINIRole (REQUIP) 0.25 MG tablet Take 0.25 mg by mouth at bedtime.   sennosides-docusate sodium (SENOKOT-S) 8.6-50 MG tablet Take 1 tablet by mouth in the morning and at bedtime.   [DISCONTINUED] Balsam [place]-Castor Oil (VENELEX) OINT Apply topically. Special Instructions: Apply to sacrum, coccyx and bilateral buttocks qshift for prevention/ redness. Every Shift Day, Evening, Night   [DISCONTINUED] feeding supplement, GLUCERNA SHAKE, (GLUCERNA SHAKE) LIQD Take by mouth daily.   [DISCONTINUED] ondansetron (ZOFRAN-ODT) 4 MG disintegrating tablet Take 4 mg by mouth every 6 (six) hours as needed for nausea or vomiting.   [DISCONTINUED] senna (SENOKOT) 8.6 MG tablet Take 1 tablet by mouth 2 (two) times daily.   No facility-administered encounter medications on file as of 05/31/2023.      SIGNIFICANT DIAGNOSTIC EXAMS  PREVIOUS   07-27-22: dexa: t score -1.421  12-08-22: ct of head:  1. No acute intracranial abnormality. 2. Unchanged encephalomalacia in the left basal ganglia with prominent low-density changes in the left periventricular and subcortical white matter.  NO NEW EXAMS   LABS REVIEWED PREVIOUS:     06-27-22: wbc 7.7; hgb 15.4; hct 47.6; mcv 92.2 plt 159; glucose 151; bun 38; creat 2.13; k+ 4.1; na++ 142; ca 8.7; gfr 31; protein 6.1; albumin 2.9; hgb a1c 6.5; chol 103; ldl 65; trig 76; hdl 23  09-06-22; urine micro-albumin: 456.2 11-13-22: hgb A1c 7.5; ACR 382.3   TODAY  03-30-23: wbc 8.1; hgb 15.8; hct 48.4 mcv 93.1 plt 182; glucose 123; bun 34; creat 1.63; k+ 3.9; na++ 138; ca 8.9; gfr 43; protein 6.2 albumin 3.0; hgb A1c 6.9; chol 98; ldl 59; trig 77; ldl 24; tsh 1.280   Review of Systems  Reason unable to perform ROS: aphasia.   Physical Exam Constitutional:      General: He is not in acute distress.    Appearance: He is well-developed. He is not diaphoretic.  Neck:     Thyroid: No thyromegaly.  Cardiovascular:     Rate and Rhythm: Normal rate and regular rhythm.     Pulses: Normal pulses.     Heart sounds: Normal heart sounds.  Pulmonary:     Effort: Pulmonary effort is normal. No respiratory distress.     Breath sounds: Normal breath sounds.  Abdominal:     General: Bowel sounds are normal. There is no distension.     Palpations: Abdomen is soft.     Tenderness: There is no abdominal tenderness.  Musculoskeletal:     Cervical back: Neck supple.     Right lower leg: No edema.     Left lower leg: No edema.     Comments: Right hemiplegia    Lymphadenopathy:     Cervical: No cervical adenopathy.  Skin:    General: Skin is warm and dry.  Neurological:     Mental Status: He is alert. Mental status  is at baseline.  Psychiatric:        Mood and Affect: Mood normal.     ASSESSMENT/ PLAN:  TODAY:   Aortic atherosclerosis: (ct  01-04-19) is on statin  2. CAD native heart without angina: s/p cabg 2012; is on cozaar 25 mg daily   3. Hypertension associated with type 2 diabetes mellitus: b/p 118/62 will continue cozaar 25 mg daily and norvasc 5 mg daily    PREVIOUS   4. History of dvt: has completed eliquis therapy  5. Osteopenia: t score -1.421 is on calcium and vitamin D supplements  6. Dyslipidemia associated with type 2 diabetes mellitus: ldl 57 will continue lipitor 40 mg daily   7. Micro-albuminuria due to type 2 diabetes mellitus: 382.3 is on ARB  8. Major depression with psychotic features: is taking prozac 10 mg daily   9. Chronic constipation: is on senna s twice daily   10. Chronic urine retention/bladder spasms: foley is out; not on medications  11. GERD without esophagitis: will continue prevacid 30 mg daily   12. Chronic non-seasonal allergic rhinitis: will continue claritin 10 mg daily   13. CKD stage 3 due to type 2 diabetes mellitus: bun 34; creat 1.63; gfr 43  14. Type 2 diabetes mellitus with peripheral vascular disease: hgb A1c 6.9; will continue humalog 5 units with meals twice daily; lantus 20 units nightly is on statin and arb  15. Protein calorie malnutrition severe: weight is 168 pounds; albumin 3.0 will continue supplements as directed   16. History of nontraumatic subcortical hemorrhage left cerebral hemisphere/right spastic hemiplegia (05-22-21) is on baclofen 10 mg three times daily for spasticity  17. Restless leg syndrome: is on requip 0.25 mg daily   18. Chronic generalized pain: will continue tylenol 650 mg twice daily     Synthia Innocent NP [facility]  call [phone]

## 2023-06-09 ENCOUNTER — Non-Acute Institutional Stay (SKILLED_NURSING_FACILITY): Payer: Medicare PPO | Admitting: Internal Medicine

## 2023-06-09 ENCOUNTER — Encounter: Payer: Self-pay | Admitting: Internal Medicine

## 2023-06-09 DIAGNOSIS — N183 Chronic kidney disease, stage 3 unspecified: Secondary | ICD-10-CM

## 2023-06-09 DIAGNOSIS — S30810A Abrasion of lower back and pelvis, initial encounter: Secondary | ICD-10-CM | POA: Diagnosis not present

## 2023-06-09 DIAGNOSIS — I4891 Unspecified atrial fibrillation: Secondary | ICD-10-CM | POA: Diagnosis not present

## 2023-06-09 DIAGNOSIS — E1122 Type 2 diabetes mellitus with diabetic chronic kidney disease: Secondary | ICD-10-CM

## 2023-06-09 DIAGNOSIS — F0150 Vascular dementia without behavioral disturbance: Secondary | ICD-10-CM

## 2023-06-09 NOTE — Assessment & Plan Note (Addendum)
Rhythm is essentially regular and rate well-controlled.  No change indicated.  Clinically he is in normal sinus rhythm.

## 2023-06-09 NOTE — Progress Notes (Signed)
NURSING HOME LOCATION:  [facility] ROOM NUMBER: 130 P  CODE STATUS: DNR  PCP:  Synthia Innocent NP  This is a nursing facility follow up visit of chronic medical diagnoses & to document compliance with Regulation 483.30 (c) in The Long Term Care Survey Manual Phase 2 which mandates caregiver visit ( visits can alternate among physician, PA or NP as per statutes) within 10 days of 30 days / 60 days/ 90 days post admission to SNF date    Interim medical record and care since last SNF visit was updated with review of diagnostic studies and change in clinical status since last visit were documented.  HPI: He is a permanent resident of facility with medical diagnoses of CAD with history of NSTEMI, dyslipidemia, history of prostate cancer, diabetes with CKD and neurovascular complications, history of diverticulosis, history of gastric ulcer, atrial fibrillation, essential hypertension, and history of hemorrhagic stroke with hemiparesis. He has had CABG.  On 03/30/2023 creatinine was 1.63 down from prior values of 2.13.  GFR has improved from 31 indicating low stage IIIb CKD to GFR 43 indicating high stage IIIa CKD.  Protein/caloric malnutrition was essentially stable with albumin of 3 and total protein of 6.2, minimally improved from prior values of 2.9 and 6.1.  Diabetic control was good based on A1c of 6.9%.  TSH was therapeutic at 1.280.  Review of systems: Dementia invalidated responses. He tells me he had a stroke 4 years ago and he tells me that he is 20 but he cannot tell me today's date.  He contradicts himself as to pain with limb manipulation seemingly stating yes at 1 time and then no another. His wife is concerned about him experiencing pain when he has cleansing of the groin or rectal area.The  Wound Care Nurse checked him.  Inside his briefs she noted that his wife had placed a Kotex pad which had a plastic backing.  This was rubbing an small area of shear on the  buttocks.  There was also suggestion of some mild inguinal dermatitis.  It was her opinion that the plastic backed Kotex was not only irritating the area of shear but also causing retention of urine leading to the dermatitis. His wife also notes that he appears to be in pain when the limbs are manipulated.  She also questions whether he might be having dysuria.  Physical exam:  Pertinent or positive findings: Pattern alopecia is present.  The eyebrows are decreased in density especially on the left.  There is an excoriation above the left eyebrow medially.  The lateral mouth sags slightly.  He is missing anterior maxillary teeth.  He has a grade 1/2 systolic murmur.  Second heart sound is increased.  Breath sounds are decreased. Pedal pulses are not palpable.  The right upper extremity is stronger to opposition than the right lower extremity.  The right hand appears to be contracted but he can extend the fingers for the most part.  There is decreased range of motion of the right lower extremity.  As noted when I manipulated the right upper extremity he would complain of pain intermittently but not consistently.  He has interosseous wasting of the hands.  There is full range of motion of the left upper and left lower extremities without complaint of pain.  General appearance: no acute distress, increased work of breathing is present.   Lymphatic: No lymphadenopathy about the head, neck, axilla. Eyes: No conjunctival inflammation or lid edema is present. There  is no scleral icterus. Ears:  External ear exam shows no significant lesions or deformities.   Nose:  External nasal examination shows no deformity or inflammation. Nasal mucosa are pink and moist without lesions, exudates Neck:  No thyromegaly, masses, tenderness noted.    Heart:  Normal rate and regular rhythm. S1 normal without gallop,  click, rub .  Lungs:  without wheezes, rhonchi, rales, rubs. Abdomen: Bowel sounds are normal. Abdomen is soft  and nontender with no organomegaly, hernias, masses. GU: Deferred  Extremities:  No cyanosis, clubbing, edema  Neurologic exam :Balance, Rhomberg, finger to nose testing could not be completed due to clinical state Skin: Warm & dry w/o tenting. No significant rash.  See summary under each active problem in the Problem List with associated updated therapeutic plan

## 2023-06-09 NOTE — Assessment & Plan Note (Addendum)
He told me he was 40 and that he had a stroke 4 years ago.  He could not tell me the current year.  He contradicted himself especially in reference to any limb pain with passive manipulation.  He would reply affirmatively with passive range of motion testing but then replied negatively upon retesting.

## 2023-06-09 NOTE — Assessment & Plan Note (Addendum)
Current A1c is 6.9% indicating excellent control.  There has been improvement in the CKD from low stage IIIb to high stage IIIa with a current GFR of 43 and creatinine 1.63.  Med list reviewed; no change in meds or dosages indicated.

## 2023-06-09 NOTE — Patient Instructions (Signed)
See assessment and plan under each diagnosis in the problem list and acutely for this visit 

## 2023-07-06 ENCOUNTER — Non-Acute Institutional Stay (SKILLED_NURSING_FACILITY): Payer: Medicare PPO | Admitting: Adult Health

## 2023-07-06 ENCOUNTER — Encounter: Payer: Self-pay | Admitting: Adult Health

## 2023-07-06 DIAGNOSIS — I739 Peripheral vascular disease, unspecified: Secondary | ICD-10-CM | POA: Diagnosis not present

## 2023-07-06 DIAGNOSIS — N183 Chronic kidney disease, stage 3 unspecified: Secondary | ICD-10-CM

## 2023-07-06 DIAGNOSIS — I700 Atherosclerosis of aorta: Secondary | ICD-10-CM

## 2023-07-06 DIAGNOSIS — E1122 Type 2 diabetes mellitus with diabetic chronic kidney disease: Secondary | ICD-10-CM

## 2023-07-06 NOTE — Progress Notes (Signed)
Location:  [facility] Room Number: 130 Place of Service:  SNF (31)   CODE STATUS: dnr   No Known Allergies  Chief Complaint  Patient presents with   Acute Visit    Care plan meeting     HPI:  We have come together for her care plan meeting. Family present  BIMS 4/15 mood: 0/30. He is wheelchair bound with no falls. He requires dependent assist with his adls. He is incontinent of bladder and bowel. Dietary: regular diet with sandwiches appetite 25-75% setup for meals. Weight is 171.5 pounds. Therapy: none at this time. Activities: TV music. He continues to be followed for his chronic illnesses including:    Aortic atherosclerosis    Peripheral vascular disease   CKD stage 3 due to type 2 diabetes mellitus   Past Medical History:  Diagnosis Date   A-fib (HCC)    a. Post-op afib after CABG 08/2012.   Acute gastric ulcer with hemorrhage    Acute respiratory failure with hypoxia (HCC)    Aphasia following cerebral infarction    CAD (coronary artery disease)    a. NSTEMI s/p stent to distal RCA 03/2000. b. Inf MI s/p emergent thrombectomy/stenting mid RCA 10/2000. c. NSTEMI s/p CABGx4 (LIMA-LAD, SVG-OM2, seq SVG-acute marginal and PD) 08/16/12 - course complicated by confusion, post-op AF, L pleural effusion with thoracentesis. d. Normal LV function by echo 08/2012.   Diverticulosis    DM with CKD    Dysphagia following cerebral infarction    Encephalopathy    Extended spectrum beta lactamase (ESBL) resistance    Generalized anxiety disorder    GERD (gastroesophageal reflux disease)    Hemiplegia and hemiparesis following cerebral infarction affecting right dominant side (HCC)    Hiatal hernia    HLD (hyperlipidemia)    HTN (hypertension)    Non-ST elevation (NSTEMI) myocardial infarction [facility])    Nontraumatic intracerebral hemorrhage in hemisphere, subcortical [facility])    Peripheral vascular disease (HCC)    a. Carotid dopplers neg 08/13/12. b. Pre-cabg ABIs -  R=0.87 suggesting mild dz, L=1.29 possibly falsely elevated due to calcified vessels.   Pleural effusion    a. L pleural eff after CABG s/p thoracentesis 08/22/12.   Prostate cancer [facility])    Status post radiation treatment.   Prostatic hypertrophy    a. Hx of urinary retention, awaiting TURP.   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 Mild  MR by TEE 08/2012.    Past Surgical History:  Procedure Laterality Date   APPENDECTOMY     BACK SURGERY     CORONARY ARTERY BYPASS GRAFT  08/16/2012   Procedure: CORONARY ARTERY BYPASS GRAFTING (CABG);  Surgeon: Loreli Slot, MD;  Location: [facility] OR;  Service: Open Heart Surgery;  Laterality: N/A;   IR REPLC GASTRO/COLONIC TUBE PERCUT W/FLUORO  02/13/2019   LEFT HEART CATHETERIZATION WITH CORONARY ANGIOGRAM N/A 08/14/2012   Procedure: LEFT HEART CATHETERIZATION WITH CORONARY ANGIOGRAM;  Surgeon: Peter M [place]az[place], MD;  Location: [facility] CATH LAB;  Service: Cardiovascular;  Laterality: N/A;    Social History   Socioeconomic History   Marital status: Married    Spouse name: Not on file   Number of children: Not on file   Years of education: Not on file   Highest education level: Not on file  Occupational History  Occupation: retired   Tobacco Use   Smoking status: Former    Current packs/day: 0.00    Types: Cigarettes    Quit date: 11/12/1972    Years since quitting: 50.6   Smokeless tobacco: Never  Vaping Use   Vaping status: Never Used  Substance and Sexual Activity   Alcohol use: No   Drug use: No   Sexual activity: Not Currently  Other Topics Concern   Not on file  Social History Narrative   He is a long term patient of [facility]    Social Determinants of Health   Financial Resource Strain: Low Risk  (07/01/2019)   Overall Financial Resource Strain (CARDIA)    Difficulty of Paying Living Expenses: Not hard at all   Food Insecurity: No Food Insecurity (02/03/2021)   [facility]   Hunger Vital Sign    Worried About Running Out of Food in the Last Year: Never true    Ran Out of Food in the Last Year: Never true  Transportation Needs: Unknown (07/01/2019)   PRAPARE - Transportation    Lack of Transportation (Medical): Patient declined    Lack of Transportation (Non-Medical): Patient declined  Physical Activity: Inactive (10/30/2019)   Exercise Vital Sign    Days of Exercise per Week: 0 days    Minutes of Exercise per Session: 0 min  Stress: Unknown (07/01/2019)   [employer] of Occupational Health - Occupational Stress Questionnaire    Feeling of Stress : Patient declined  Social Connections: Unknown (04/25/2022)   Received from [facility]   Social Network    Social Network: Not on file  Intimate Partner Violence: Unknown (03/17/2022)   Received from [facility]   HITS    Physically Hurt: Not on file    Insult or Talk Down To: Not on file    Threaten Physical Harm: Not on file    Scream or Curse: Not on file   Family History  Problem Relation Age of Onset   Hypertension Mother       VITAL SIGNS BP 106/64   Pulse 60   Temp 97.7 F (36.5 C)   Resp 16   Ht 5\' 11"  (1.803 m)   Wt 171 lb 8 oz (77.8 kg)   SpO2 94%   BMI 23.92 kg/m   Outpatient Encounter Medications as of 07/06/2023  Medication Sig   acetaminophen (TYLENOL) 325 MG tablet Take 650 mg by mouth in the morning and at bedtime.   amLODipine (NORVASC) 5 MG tablet Take 5 mg by mouth daily.    atorvastatin (LIPITOR) 40 MG tablet Take 40 mg by mouth daily.   baclofen (LIORESAL) 10 MG tablet Take 10 mg by mouth 3 (three) times daily.   BD AUTOSHIELD DUO 30G X 5 MM MISC 3/16"   cholecalciferol (VITAMIN D3) 25 MCG (1000 UNIT) tablet Take 1,000 Units by mouth daily.   FLUoxetine (PROZAC) 10 MG tablet Take 10 mg by mouth daily.   FLUoxetine (PROZAC) 20 MG tablet Take 20 mg by mouth daily.   insulin glargine  (LANTUS) 100 UNIT/ML injection Inject 20 Units into the skin daily.   insulin lispro (HUMALOG) 100 UNIT/ML cartridge Inject 5 Units into the skin in the morning and at bedtime. Give 5 units  with breakfast and lunch give if he eats more than 50% of meals   lansoprazole (PREVACID SOLUTAB) 30 MG disintegrating tablet Take 30 mg by mouth daily. Give before meals   loratadine (CLARITIN) 10 MG tablet Take  10 mg by mouth daily.   losartan (COZAAR) 25 MG tablet Take 25 mg by mouth daily. DX: Hypertensive heart and chronic kidney disease without heart failure, with stage 1 through stage 4 chronic kidney disease, or unspecified chronic kidney disease-per NP note, code updated   melatonin 5 MG TABS Take 5 mg by mouth at bedtime.   NON FORMULARY Diet Change: Regular, thin liquids   Nutritional Supplements (ENSURE ENLIVE PO) Take by mouth daily.   rOPINIRole (REQUIP) 0.25 MG tablet Take 0.25 mg by mouth at bedtime.   sennosides-docusate sodium (SENOKOT-S) 8.6-50 MG tablet Take 1 tablet by mouth in the morning and at bedtime.   No facility-administered encounter medications on file as of 07/06/2023.     SIGNIFICANT DIAGNOSTIC EXAMS   PREVIOUS   07-27-22: dexa: t score -1.421  12-08-22: ct of head:  1. No acute intracranial abnormality. 2. Unchanged encephalomalacia in the left basal ganglia with prominent low-density changes in the left periventricular and subcortical white matter.  NO NEW EXAMS   LABS REVIEWED PREVIOUS:     06-27-22: wbc 7.7; hgb 15.4; hct 47.6; mcv 92.2 plt 159; glucose 151; bun 38; creat 2.13; k+ 4.1; na++ 142; ca 8.7; gfr 31; protein 6.1; albumin 2.9; hgb a1c 6.5; chol 103; ldl 65; trig 76; hdl 23  09-06-22; urine micro-albumin: 456.2 11-13-22: hgb A1c 7.5; ACR 382.3  03-30-23: wbc 8.1; hgb 15.8; hct 48.4 mcv 93.1 plt 182; glucose 123; bun 34; creat 1.63; k+ 3.9; na++ 138; ca 8.9; gfr 43; protein 6.2 albumin 3.0; hgb A1c 6.9; chol 98; ldl 59; trig 77; ldl 24; tsh 1.280   NO NEW  LABS.   Review of Systems  Reason unable to perform ROS: aphasia.   Physical Exam Constitutional:      General: He is not in acute distress.    Appearance: He is well-developed. He is not diaphoretic.  Neck:     Thyroid: No thyromegaly.  Cardiovascular:     Rate and Rhythm: Normal rate and regular rhythm.     Pulses: Normal pulses.     Heart sounds: Normal heart sounds.  Pulmonary:     Effort: Pulmonary effort is normal. No respiratory distress.     Breath sounds: Normal breath sounds.  Abdominal:     General: Bowel sounds are normal. There is no distension.     Palpations: Abdomen is soft.     Tenderness: There is no abdominal tenderness.  Musculoskeletal:     Cervical back: Neck supple.     Right lower leg: No edema.     Left lower leg: No edema.     Comments: Right hemiplegia   Lymphadenopathy:     Cervical: No cervical adenopathy.  Skin:    General: Skin is warm and dry.  Neurological:     Mental Status: He is alert. Mental status is at baseline.  Psychiatric:        Mood and Affect: Mood normal.       ASSESSMENT/ PLAN:  TODAY  Aortic atherosclerosis Peripheral vascular disease CKD stage 3 due to type 2 diabetes mellitus    Will continue current medications Will continue current plan of care Will continue to monitor her status.   Time spent with patient 40 minutes: plan of care dietary; medications.    Synthia Innocent NP [facility]   call [phone]

## 2023-07-19 ENCOUNTER — Encounter: Payer: Self-pay | Admitting: Adult Health

## 2023-07-19 ENCOUNTER — Non-Acute Institutional Stay (SKILLED_NURSING_FACILITY): Payer: Medicare PPO | Admitting: Adult Health

## 2023-07-19 DIAGNOSIS — E1169 Type 2 diabetes mellitus with other specified complication: Secondary | ICD-10-CM

## 2023-07-19 DIAGNOSIS — E785 Hyperlipidemia, unspecified: Secondary | ICD-10-CM

## 2023-07-19 DIAGNOSIS — M8580 Other specified disorders of bone density and structure, unspecified site: Secondary | ICD-10-CM

## 2023-07-19 DIAGNOSIS — Z86718 Personal history of other venous thrombosis and embolism: Secondary | ICD-10-CM | POA: Diagnosis not present

## 2023-07-19 DIAGNOSIS — F5105 Insomnia due to other mental disorder: Secondary | ICD-10-CM | POA: Diagnosis not present

## 2023-07-19 DIAGNOSIS — F333 Major depressive disorder, recurrent, severe with psychotic symptoms: Secondary | ICD-10-CM | POA: Diagnosis not present

## 2023-07-19 NOTE — Progress Notes (Signed)
Location:  [facility] Room Number: 130 P Place of Service:  SNF (31)   CODE STATUS: DNR  No Known Allergies  Chief Complaint  Patient presents with   Medical Management of Chronic Issues                   History of DVT:     Osteopenia:     Dyslipidemia associated with type 2 diabetes mellitus     HPI:  He is a 77 year old long term resident of this facility being seen for the management of his chronic illnesses: History of DVT:     Osteopenia:     Dyslipidemia associated with type 2 diabetes mellitus. There no reports of uncontrolled pain. He continues to get out bed daily.   Past Medical History:  Diagnosis Date   A-fib [facility])    a. Post-op afib after CABG 08/2012.   Acute gastric ulcer with hemorrhage    Acute respiratory failure with hypoxia (HCC)    Aphasia following cerebral infarction    CAD (coronary artery disease)    a. NSTEMI s/p stent to distal RCA 03/2000. b. Inf MI s/p emergent thrombectomy/stenting mid RCA 10/2000. c. NSTEMI s/p CABGx4 (LIMA-LAD, SVG-OM2, seq SVG-acute marginal and PD) 08/16/12 - course complicated by confusion, post-op AF, L pleural effusion with thoracentesis. d. Normal LV function by echo 08/2012.   Diverticulosis    DM with CKD    Dysphagia following cerebral infarction    Encephalopathy    Extended spectrum beta lactamase (ESBL) resistance    Generalized anxiety disorder    GERD (gastroesophageal reflux disease)    Hemiplegia and hemiparesis following cerebral infarction affecting right dominant side (HCC)    Hiatal hernia    HLD (hyperlipidemia)    HTN (hypertension)    Non-ST elevation (NSTEMI) myocardial infarction [facility])    Nontraumatic intracerebral hemorrhage in hemisphere, subcortical [facility])    Peripheral vascular disease (HCC)    a. Carotid dopplers neg 08/13/12. b. Pre-cabg ABIs - R=0.87 suggesting mild dz, L=1.29 possibly falsely elevated due to calcified vessels.   Pleural effusion    a. L pleural eff after  CABG s/p thoracentesis 08/22/12.   Prostate cancer [facility])    Status post radiation treatment.   Prostatic hypertrophy    a. Hx of urinary retention, awaiting TURP.   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 Mild  MR by TEE 08/2012.    Past Surgical History:  Procedure Laterality Date   APPENDECTOMY     BACK SURGERY     CORONARY ARTERY BYPASS GRAFT  08/16/2012   Procedure: CORONARY ARTERY BYPASS GRAFTING (CABG);  Surgeon: Loreli Slot, MD;  Location: [facility] OR;  Service: Open Heart Surgery;  Laterality: N/A;   IR REPLC GASTRO/COLONIC TUBE PERCUT W/FLUORO  02/13/2019   LEFT HEART CATHETERIZATION WITH CORONARY ANGIOGRAM N/A 08/14/2012   Procedure: LEFT HEART CATHETERIZATION WITH CORONARY ANGIOGRAM;  Surgeon: Peter M Swaziland, MD;  Location: [facility] CATH LAB;  Service: Cardiovascular;  Laterality: N/A;    Social History   Socioeconomic History   Marital status: Married    Spouse name: Not on file   Number of children: Not on file   Years of education: Not on file   Highest education level: Not on file  Occupational History   Occupation: retired  Tobacco Use   Smoking status: Former    Current packs/day: 0.00    Types: Cigarettes    Quit date: 11/12/1972    Years since quitting: 50.7   Smokeless tobacco: Never  Vaping Use   Vaping status: Never Used  Substance and Sexual Activity   Alcohol use: No   Drug use: No   Sexual activity: Not Currently  Other Topics Concern   Not on file  Social History Narrative   He is a long term patient of [facility]    Social Determinants of Health   Financial Resource Strain: Low Risk  (07/01/2019)   Overall Financial Resource Strain (CARDIA)    Difficulty of Paying Living Expenses: Not hard at all  Food Insecurity: No Food Insecurity (02/03/2021)   [facility]   Hunger Vital Sign    Worried About Running Out of  Food in the Last Year: Never true    Ran Out of Food in the Last Year: Never true  Transportation Needs: Unknown (07/01/2019)   PRAPARE - Transportation    Lack of Transportation (Medical): Patient declined    Lack of Transportation (Non-Medical): Patient declined  Physical Activity: Inactive (10/30/2019)   Exercise Vital Sign    Days of Exercise per Week: 0 days    Minutes of Exercise per Session: 0 min  Stress: Unknown (07/01/2019)   [employer] of Occupational Health - Occupational Stress Questionnaire    Feeling of Stress : Patient declined  Social Connections: Unknown (04/25/2022)   [facility]   Social Network    Social Network: Not on file  Intimate Partner Violence: Unknown (03/17/2022)   Received from [facility]   HITS    Physically Hurt: Not on file    Insult or Talk Down To: Not on file    Threaten Physical Harm: Not on file    Scream or Curse: Not on file   Family History  Problem Relation Age of Onset   Hypertension Mother       VITAL SIGNS BP 112/60   Pulse 69   Temp 97.6 F (36.4 C)   Ht 5\' 11"  (1.803 m)   Wt 174 lb 6.4 oz (79.1 kg)   SpO2 91%   BMI 24.32 kg/m   Outpatient Encounter Medications as of 07/19/2023  Medication Sig   acetaminophen (TYLENOL) 325 MG tablet Take 325 mg by mouth in the morning and at bedtime.   amLODipine (NORVASC) 5 MG tablet Take 5 mg by mouth daily.    atorvastatin (LIPITOR) 40 MG tablet Take 40 mg by mouth daily.   baclofen (LIORESAL) 10 MG tablet Take 10 mg by mouth 3 (three) times daily.   BD AUTOSHIELD DUO 30G X 5 MM MISC 3/16"   cholecalciferol (VITAMIN D3) 25 MCG (1000 UNIT) tablet Take 1,000 Units by mouth daily.   clotrimazole (LOTRIMIN) 1 % cream Apply 1 Application topically 2 (two) times daily.   Continuous Glucose Receiver (FREESTYLE LIBRE 3 READER) DEVI 1 Device by Does not apply route daily.   Continuous Glucose Sensor (FREESTYLE LIBRE 3 SENSOR) MISC 1 Device by Does not apply route every  14 (fourteen) days. Place 1 sensor on the skin every 14 days. Use to check glucose continuously   FLUoxetine (PROZAC) 10 MG tablet Take 10 mg by mouth daily.   FLUoxetine (PROZAC) 20 MG tablet Take 20 mg by mouth daily.   insulin glargine (LANTUS) 100 UNIT/ML injection Inject 20 Units into the skin daily.   insulin  lispro (HUMALOG) 100 UNIT/ML cartridge Inject 5 Units into the skin in the morning and at bedtime. Give 5 units  with breakfast and lunch give if he eats more than 50% of meals   lansoprazole (PREVACID SOLUTAB) 30 MG disintegrating tablet Take 30 mg by mouth daily. Give before meals   loratadine (CLARITIN) 10 MG tablet Take 10 mg by mouth daily.   losartan (COZAAR) 25 MG tablet Take 25 mg by mouth daily. DX: Hypertensive heart and chronic kidney disease without heart failure, with stage 1 through stage 4 chronic kidney disease, or unspecified chronic kidney disease-per NP note, code updated   melatonin 5 MG TABS Take 5 mg by mouth at bedtime.   NON FORMULARY Diet: Regular; please put food in bowls for all meals   Nutritional Supplements (ENSURE ENLIVE PO) Take by mouth daily.   rOPINIRole (REQUIP) 0.25 MG tablet Take 0.25 mg by mouth at bedtime.   sennosides-docusate sodium (SENOKOT-S) 8.6-50 MG tablet Take 1 tablet by mouth in the morning and at bedtime.   No facility-administered encounter medications on file as of 07/19/2023.     SIGNIFICANT DIAGNOSTIC EXAMS  PREVIOUS   07-27-22: dexa: t score -1.421  12-08-22: ct of head:  1. No acute intracranial abnormality. 2. Unchanged encephalomalacia in the left basal ganglia with prominent low-density changes in the left periventricular and subcortical white matter.  NO NEW EXAMS   LABS REVIEWED PREVIOUS:     09-06-22; urine micro-albumin: 456.2 11-13-22: hgb A1c 7.5; ACR 382.3  03-30-23: wbc 8.1; hgb 15.8; hct 48.4 mcv 93.1 plt 182; glucose 123; bun 34; creat 1.63; k+ 3.9; na++ 138; ca 8.9; gfr 43; protein 6.2 albumin 3.0; hgb A1c  6.9; chol 98; ldl 59; trig 77; ldl 24; tsh 1.280   NO NEW LABS.   Review of Systems  Reason unable to perform ROS: aphasia.   Physical Exam Constitutional:      General: He is not in acute distress.    Appearance: He is well-developed. He is not diaphoretic.  Neck:     Thyroid: No thyromegaly.  Cardiovascular:     Rate and Rhythm: Normal rate and regular rhythm.     Pulses: Normal pulses.     Heart sounds: Normal heart sounds.  Pulmonary:     Effort: Pulmonary effort is normal. No respiratory distress.     Breath sounds: Normal breath sounds.  Abdominal:     General: Bowel sounds are normal. There is no distension.     Palpations: Abdomen is soft.     Tenderness: There is no abdominal tenderness.  Musculoskeletal:     Cervical back: Neck supple.     Right lower leg: No edema.     Left lower leg: No edema.     Comments: Right hemiplegia   Lymphadenopathy:     Cervical: No cervical adenopathy.  Skin:    General: Skin is warm and dry.  Neurological:     Mental Status: He is alert. Mental status is at baseline.  Psychiatric:        Mood and Affect: Mood normal.      ASSESSMENT/ PLAN:  TODAY:   History of DVT: has completed eliquis therapy  2. Osteopenia: t score -1.421 is on supplements  3. Dyslipidemia associated with type 2 diabetes mellitus: ldl 57 will continue lipitor 40 mg daily    PREVIOUS   4. Micro-albuminuria due to type 2 diabetes mellitus: 382.3 is on ARB  5. Major depression with psychotic features: is taking  prozac 10 mg daily   6. Chronic constipation: is on senna s twice daily   7. Chronic urine retention/bladder spasms: foley is out; not on medications  8. GERD without esophagitis: will continue prevacid 30 mg daily   9. Chronic non-seasonal allergic rhinitis: will continue claritin 10 mg daily   10. CKD stage 3 due to type 2 diabetes mellitus: bun 34; creat 1.63; gfr 43  11. Type 2 diabetes mellitus with peripheral vascular disease:  hgb A1c 6.9; will continue humalog 5 units with meals twice daily; lantus 20 units nightly is on statin and arb  12. Protein calorie malnutrition severe: weight is 168 pounds; albumin 3.0 will continue supplements as directed   13. History of nontraumatic subcortical hemorrhage left cerebral hemisphere/right spastic hemiplegia (05-22-21) is on baclofen 10 mg three times daily for spasticity  14. Restless leg syndrome: is on requip 0.25 mg daily   15. Chronic generalized pain: will continue tylenol 650 mg twice daily   16. Aortic atherosclerosis: (ct 01-04-19) is on statin  17. CAD native heart without angina: s/p cabg 2012; is on cozaar 25 mg daily   18. Hypertension associated with type 2 diabetes mellitus: b/p 112/60 will continue cozaar 25 mg daily and norvasc 5 mg daily      Synthia Innocent NP [facility]  call [phone]

## 2023-07-20 ENCOUNTER — Other Ambulatory Visit (HOSPITAL_COMMUNITY)
Admission: RE | Admit: 2023-07-20 | Discharge: 2023-07-20 | Disposition: A | Discharge status: Home / Self Care | Payer: Medicare PPO | Source: Skilled Nursing Facility | Attending: Adult Health | Admitting: Adult Health

## 2023-07-20 DIAGNOSIS — E1151 Type 2 diabetes mellitus with diabetic peripheral angiopathy without gangrene: Secondary | ICD-10-CM | POA: Diagnosis not present

## 2023-07-20 LAB — HEMOGLOBIN A1C
Hgb A1c MFr Bld: 6.8 % — ABNORMAL HIGH (ref 4.8–5.6)
Mean Plasma Glucose: 148.46 mg/dL

## 2023-07-24 NOTE — Progress Notes (Signed)
History of Present Illness: Here today for follow-up.  He now has been without a catheter for some time.  No real urinary issues.  He continuously drips in his diaper.  There has not been any known treatment for UTI in the past few months.  There is concerned about the appearance underneath his foreskin.  Past Medical History:  Diagnosis Date   A-fib [facility])    a. Post-op afib after CABG 08/2012.   Acute gastric ulcer with hemorrhage    Acute respiratory failure with hypoxia (HCC)    Aphasia following cerebral infarction    CAD (coronary artery disease)    a. NSTEMI s/p stent to distal RCA 03/2000. b. Inf MI s/p emergent thrombectomy/stenting mid RCA 10/2000. c. NSTEMI s/p CABGx4 (LIMA-LAD, SVG-OM2, seq SVG-acute marginal and PD) 08/16/12 - course complicated by confusion, post-op AF, L pleural effusion with thoracentesis. d. Normal LV function by echo 08/2012.   Diverticulosis    DM with CKD    Dysphagia following cerebral infarction    Encephalopathy    Extended spectrum beta lactamase (ESBL) resistance    Generalized anxiety disorder    GERD (gastroesophageal reflux disease)    Hemiplegia and hemiparesis following cerebral infarction affecting right dominant side (HCC)    Hiatal hernia    HLD (hyperlipidemia)    HTN (hypertension)    Non-ST elevation (NSTEMI) myocardial infarction [facility])    Nontraumatic intracerebral hemorrhage in hemisphere, subcortical [facility])    Peripheral vascular disease (HCC)    a. Carotid dopplers neg 08/13/12. b. Pre-cabg ABIs - R=0.87 suggesting mild dz, L=1.29 possibly falsely elevated due to calcified vessels.   Pleural effusion    a. L pleural eff after CABG s/p thoracentesis 08/22/12.   Prostate cancer [facility])    Status post radiation treatment.   Prostatic hypertrophy    a. Hx of urinary retention, awaiting TURP.   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 Mild  MR by TEE 08/2012.    Past Surgical History:  Procedure Laterality Date   APPENDECTOMY     BACK SURGERY     CORONARY ARTERY BYPASS GRAFT  08/16/2012   Procedure: CORONARY ARTERY BYPASS GRAFTING (CABG);  Surgeon: Loreli Slot, MD;  Location: [facility] OR;  Service: Open Heart Surgery;  Laterality: N/A;   IR REPLC GASTRO/COLONIC TUBE PERCUT W/FLUORO  02/13/2019   LEFT HEART CATHETERIZATION WITH CORONARY ANGIOGRAM N/A 08/14/2012   Procedure: LEFT HEART CATHETERIZATION WITH CORONARY ANGIOGRAM;  Surgeon: Peter M Swaziland, MD;  Location: [facility] CATH LAB;  Service: Cardiovascular;  Laterality: N/A;    Home Medications:  Allergies as of 07/25/2023   No Known Allergies      Medication List        Accurate as of July 24, 2023  7:55 PM. If you have any questions, ask your nurse or doctor.          acetaminophen 325 MG tablet Commonly known as: TYLENOL Take 325 mg by mouth in the morning and at bedtime.   amLODipine 5 MG tablet Commonly known as: NORVASC Take 5 mg by mouth daily.   atorvastatin 40 MG tablet Commonly known as: LIPITOR Take 40 mg by mouth daily.   baclofen 10 MG tablet Commonly known as: LIORESAL Take 10 mg by mouth 3 (three) times daily.   BD AutoShield Duo 30G X 5 MM  Misc Generic drug: Insulin Pen Needle 3/16"   cholecalciferol 25 MCG (1000 UNIT) tablet Commonly known as: VITAMIN D3 Take 1,000 Units by mouth daily.   clotrimazole 1 % cream Commonly known as: LOTRIMIN Apply 1 Application topically 2 (two) times daily.   ENSURE ENLIVE PO Take by mouth daily.   FLUoxetine 10 MG tablet Commonly known as: PROZAC Take 10 mg by mouth daily.   FLUoxetine 20 MG tablet Commonly known as: PROZAC Take 20 mg by mouth daily.   FreeStyle Libre 3 Reader Devi 1 Device by Does not apply route daily.   FreeStyle Libre 3 Sensor Misc 1 Device by Does not apply route every 14 (fourteen) days. Place 1 sensor on the skin every 14 days. Use to  check glucose continuously   HumaLOG 100 UNIT/ML cartridge Generic drug: insulin lispro Inject 5 Units into the skin in the morning and at bedtime. Give 5 units  with breakfast and lunch give if he eats more than 50% of meals   insulin glargine 100 UNIT/ML injection Commonly known as: LANTUS Inject 20 Units into the skin daily.   lansoprazole 30 MG disintegrating tablet Commonly known as: PREVACID SOLUTAB Take 30 mg by mouth daily. Give before meals   loratadine 10 MG tablet Commonly known as: CLARITIN Take 10 mg by mouth daily.   losartan 25 MG tablet Commonly known as: COZAAR Take 25 mg by mouth daily. DX: Hypertensive heart and chronic kidney disease without heart failure, with stage 1 through stage 4 chronic kidney disease, or unspecified chronic kidney disease-per NP note, code updated   melatonin 5 MG Tabs Take 5 mg by mouth at bedtime.   NON FORMULARY Diet: Regular; please put food in bowls for all meals   rOPINIRole 0.25 MG tablet Commonly known as: REQUIP Take 0.25 mg by mouth at bedtime.   sennosides-docusate sodium 8.6-50 MG tablet Commonly known as: SENOKOT-S Take 1 tablet by mouth in the morning and at bedtime.        Allergies: No Known Allergies  Family History  Problem Relation Age of Onset   Hypertension Mother     Social History:  reports that he quit smoking about 50 years ago. His smoking use included cigarettes. He has never used smokeless tobacco. He reports that he does not drink alcohol and does not use drugs.  ROS: A complete review of systems was performed.  All systems are negative except for pertinent findings as noted.  Physical Exam:  Vital signs in last 24 hours: There were no vitals taken for this visit. Cardiovascular: Regular rate  Genitourinary: Phallus is uncircumcised.  Phimosis present.  Moderate balanitis/penile discharge. Lymphatic: No lymphadenopathy Neurologic: Grossly intact, no focal deficits Psychiatric: Normal  mood and affect  I have reviewed prior pt notes  I have reviewed prior bladder scan  I have reviewed prior PSA results  I have reviewed prior urine culture   Impression/Assessment:  1.  Remote history of prostate cancer, status post external beam radiotherapy.  Last PSA 2 years ago, 0.37, stable  2.  Urinary incontinence secondary to neurologic process.  3.  Phimosis/balanitis  Plan:  1.  I think it is fine just to treat his penile issues with swabbing underneath the foreskin with peroxide swab once a day, after that applying clotrimazole/betamethasone cream for a week at a time for each infection  2.  I will have him come back in 6 months for recheck.

## 2023-07-25 ENCOUNTER — Ambulatory Visit (INDEPENDENT_AMBULATORY_CARE_PROVIDER_SITE_OTHER): Payer: Medicare PPO | Admitting: Urology

## 2023-07-25 ENCOUNTER — Encounter: Payer: Self-pay | Admitting: Urology

## 2023-07-25 VITALS — BP 117/75 | HR 80

## 2023-07-25 DIAGNOSIS — N401 Enlarged prostate with lower urinary tract symptoms: Secondary | ICD-10-CM

## 2023-07-25 DIAGNOSIS — N471 Phimosis: Secondary | ICD-10-CM | POA: Diagnosis not present

## 2023-07-25 DIAGNOSIS — N481 Balanitis: Secondary | ICD-10-CM | POA: Diagnosis not present

## 2023-07-25 DIAGNOSIS — R32 Unspecified urinary incontinence: Secondary | ICD-10-CM | POA: Diagnosis not present

## 2023-07-25 DIAGNOSIS — N319 Neuromuscular dysfunction of bladder, unspecified: Secondary | ICD-10-CM | POA: Diagnosis not present

## 2023-07-25 DIAGNOSIS — N3000 Acute cystitis without hematuria: Secondary | ICD-10-CM

## 2023-07-25 DIAGNOSIS — Z8546 Personal history of malignant neoplasm of prostate: Secondary | ICD-10-CM | POA: Diagnosis not present

## 2023-07-25 DIAGNOSIS — R339 Retention of urine, unspecified: Secondary | ICD-10-CM

## 2023-07-25 DIAGNOSIS — N39498 Other specified urinary incontinence: Secondary | ICD-10-CM

## 2023-08-08 ENCOUNTER — Non-Acute Institutional Stay (SKILLED_NURSING_FACILITY): Payer: Medicare PPO | Admitting: Internal Medicine

## 2023-08-08 ENCOUNTER — Encounter: Payer: Self-pay | Admitting: Internal Medicine

## 2023-08-08 DIAGNOSIS — I6930 Unspecified sequelae of cerebral infarction: Secondary | ICD-10-CM

## 2023-08-08 DIAGNOSIS — E1151 Type 2 diabetes mellitus with diabetic peripheral angiopathy without gangrene: Secondary | ICD-10-CM

## 2023-08-08 NOTE — Progress Notes (Unsigned)
   NURSING HOME LOCATION:  [facility] ROOM NUMBER:  130 P  CODE STATUS:  DNR  PCP:  Synthia Innocent NP  This is a nursing facility follow up visit for specific acute issue of somnolence and inability to hold containers containing liquids resulting in spills.  Interim medical record and care since last SNF visit was updated with review of diagnostic studies and change in clinical status since last visit were documented.  HPI: His wife approached me with concerns that when he attempts to drink anything from a cup or carton he drops the item and spills liquid on himself.  She has been bringing in a "sippy cup" to prevent the spills. She states that she was told his blood glucoses were as low as 68 but  also have ranged over 300.  A1c on 8/8 was 6.8% indicating excellent control.  This would correspond to an average glucose of 148.46.  Glucoses are monitored by a Freestyle libre 3 sensor.  He is on 20 units of insulin glargine and 5 units of Humalog in the morning and at bedtime.  He is to get 5 additional units with breakfast and lunch if he eats more than 50% of his meals.  Based on a set hypoglycemia protocol his Humalog is held.  His glucoses do vary with his proportion of meal intake and this varies with his degree of alertness.  Staff reports that at times he is somnolent with decreased intake.  Urologist Dr. Retta Diones saw the patient 8/13 for urinary incontinence.  He has not had a urinary catheter for some time; he continuously drips into the diaper.  Phimosis/balanitis was documented.  Swabbing underneath the foreskin with peroxide swab once daily with subsequent application of clotrimazole/betamethasone cream for a week at a time was recommended.  Follow-up was recommended in 6 months.  Review of systems: He was non verbal during the visit.  Physical exam:  Pertinent or positive findings: He was markedly somnolent and lethargic today.  He followed commands very poorly.   For the most part he kept his eyes closed. There is a verrucoid irregular lesion above the OS medially. Facies were blank.Abdomen is protuberant.  The right upper and lower extremities were flaccid.  When I raised the left upper extremity he held it extended as if in a salute.  Dorsalis pedis pulses are stronger than posterior tibial pulses.  When the feet were examined the toes were upgoing.  General appearance: Adequately nourished; no acute distress, increased work of breathing is present.   Lymphatic: No lymphadenopathy about the head, neck, axilla. Eyes: No conjunctival inflammation or lid edema is present. There is no scleral icterus. Ears:  External ear exam shows no significant lesions or deformities.   Nose:  External nasal examination shows no deformity or inflammation. Nasal mucosa are pink and moist without lesions, exudates Oral exam:  Lips and gums are healthy appearing. There is no oropharyngeal erythema or exudate. Neck:  No thyromegaly, masses, tenderness noted.    Heart:  Normal rate and regular rhythm. S1 and S2 normal without gallop, murmur, click, rub .  Lungs: Chest clear to auscultation without wheezes, rhonchi, rales, rubs. Abdomen: Bowel sounds are normal. Abdomen is soft and nontender with no organomegaly, hernias, masses. GU: Deferred  Extremities:  No cyanosis, clubbing, edema  Skin: Warm & dry w/o tenting. No significant visable rash.  See summary under each active problem in the Problem List with associated updated therapeutic plan

## 2023-08-08 NOTE — Assessment & Plan Note (Signed)
Current A1c is 6.8% indicating excellent control.  He does have occasional hypoglycemia with glucoses as low as 68; but this is in the context of variable oral intake.  Glucoses are monitored by the libre device and Humalog held based on protocol.  His intake varies with his degree of alertness. Baclofen will be decreased and held if somnolent.  With increased alertness and increased intake hypoglycemia should not be a concern.  Optimal A1c goal is probably 7.5-8%.  Hypoglycemia must be avoided as this will be analogous to TIAs.

## 2023-08-08 NOTE — Patient Instructions (Signed)
See assessment and plan under each diagnosis in the problem list and acutely for this visit 

## 2023-08-08 NOTE — Assessment & Plan Note (Signed)
The aphasia and right hemiparesis are unchanged.  There is no new neurologic deficit to suggest extension of stroke in reference to him dropping containers of liquid.  This most likely relates to excess somnolence from the muscle relaxant Baclofen.  The Baclofen will be weaned.

## 2023-08-21 ENCOUNTER — Non-Acute Institutional Stay (SKILLED_NURSING_FACILITY): Payer: Medicare PPO | Admitting: Adult Health

## 2023-08-21 ENCOUNTER — Encounter: Payer: Self-pay | Admitting: Adult Health

## 2023-08-21 DIAGNOSIS — R809 Proteinuria, unspecified: Secondary | ICD-10-CM

## 2023-08-21 DIAGNOSIS — F323 Major depressive disorder, single episode, severe with psychotic features: Secondary | ICD-10-CM

## 2023-08-21 DIAGNOSIS — E1129 Type 2 diabetes mellitus with other diabetic kidney complication: Secondary | ICD-10-CM | POA: Diagnosis not present

## 2023-08-21 DIAGNOSIS — K5909 Other constipation: Secondary | ICD-10-CM | POA: Diagnosis not present

## 2023-08-21 NOTE — Progress Notes (Unsigned)
Location:  [facility] Room Number: 130 Place of Service:  SNF (31)   CODE STATUS: dnr   No Known Allergies  Chief Complaint  Patient presents with   Medical Management of Chronic Issues           Micro-albuminuria due to type 2 diabetes mellitus:  Major depression with psychotic features:     Chronic constipation:     HPI:  He is a 77 year old long term resident of this facility being seen for the management of his chronic illnesses:  Micro-albuminuria due to type 2 diabetes mellitus:  Major depression with psychotic features:     Chronic constipation. There are no indications of pain present. He does have days where he is lethargic. His weight is stable   Past Medical History:  Diagnosis Date   A-fib (HCC)    a. Post-op afib after CABG 08/2012.   Acute gastric ulcer with hemorrhage    Acute respiratory failure with hypoxia (HCC)    Aphasia following cerebral infarction    CAD (coronary artery disease)    a. NSTEMI s/p stent to distal RCA 03/2000. b. Inf MI s/p emergent thrombectomy/stenting mid RCA 10/2000. c. NSTEMI s/p CABGx4 (LIMA-LAD, SVG-OM2, seq SVG-acute marginal and PD) 08/16/12 - course complicated by confusion, post-op AF, L pleural effusion with thoracentesis. d. Normal LV function by echo 08/2012.   Diverticulosis    DM with CKD    Dysphagia following cerebral infarction    Encephalopathy    Extended spectrum beta lactamase (ESBL) resistance    Generalized anxiety disorder    GERD (gastroesophageal reflux disease)    Hemiplegia and hemiparesis following cerebral infarction affecting right dominant side (HCC)    Hiatal hernia    HLD (hyperlipidemia)    HTN (hypertension)    Non-ST elevation (NSTEMI) myocardial infarction [facility])    Nontraumatic intracerebral hemorrhage in hemisphere, subcortical [facility])    Peripheral vascular disease (HCC)    a. Carotid dopplers neg 08/13/12. b. Pre-cabg ABIs - R=0.87 suggesting mild dz, L=1.29 possibly falsely  elevated due to calcified vessels.   Pleural effusion    a. L pleural eff after CABG s/p thoracentesis 08/22/12.   Prostate cancer [facility])    Status post radiation treatment.   Prostatic hypertrophy    a. Hx of urinary retention, awaiting TURP.   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 Mild  MR by TEE 08/2012.    Past Surgical History:  Procedure Laterality Date   APPENDECTOMY     BACK SURGERY     CORONARY ARTERY BYPASS GRAFT  08/16/2012   Procedure: CORONARY ARTERY BYPASS GRAFTING (CABG);  Surgeon: Loreli Slot, MD;  Location: [facility] OR;  Service: Open Heart Surgery;  Laterality: N/A;   IR REPLC GASTRO/COLONIC TUBE PERCUT W/FLUORO  02/13/2019   LEFT HEART CATHETERIZATION WITH CORONARY ANGIOGRAM N/A 08/14/2012   Procedure: LEFT HEART CATHETERIZATION WITH CORONARY ANGIOGRAM;  Surgeon: Peter M Swaziland, MD;  Location: [facility] CATH LAB;  Service: Cardiovascular;  Laterality: N/A;    Social History   Socioeconomic History   Marital status: Married    Spouse name: Not on file   Number of children: Not on file   Years of education: Not on file   Highest education level: Not on file  Occupational History   Occupation: retired  Tobacco Use   Smoking status: Former    Current packs/day: 0.00    Types: Cigarettes    Quit date: 11/12/1972    Years since quitting: 50.8   Smokeless tobacco: Never  Vaping Use   Vaping status: Never Used  Substance and Sexual Activity   Alcohol use: No   Drug use: No   Sexual activity: Not Currently  Other Topics Concern   Not on file  Social History Narrative   He is a long term patient of [facility]    Social Determinants of Health   Financial Resource Strain: Low Risk  (07/01/2019)   Overall Financial Resource Strain (CARDIA)    Difficulty of Paying Living Expenses: Not hard at all  Food Insecurity: No Food Insecurity (02/03/2021)    Received from [facility], [facility]   Hunger Vital Sign    Worried About Running Out of Food in the Last Year: Never true    Ran Out of Food in the Last Year: Never true  Transportation Needs: Unknown (07/01/2019)   PRAPARE - Transportation    Lack of Transportation (Medical): Patient declined    Lack of Transportation (Non-Medical): Patient declined  Physical Activity: Inactive (10/30/2019)   Exercise Vital Sign    Days of Exercise per Week: 0 days    Minutes of Exercise per Session: 0 min  Stress: Unknown (07/01/2019)   [employer] of Occupational Health - Occupational Stress Questionnaire    Feeling of Stress : Patient declined  Social Connections: Unknown (04/25/2022)   Received from [facility], [facility]   Social Network    Social Network: Not on file  Intimate Partner Violence: Unknown (03/17/2022)   Received from [facility], [facility]   HITS    Physically Hurt: Not on file    Insult or Talk Down To: Not on file    Threaten Physical Harm: Not on file    Scream or Curse: Not on file   Family History  Problem Relation Age of Onset   Hypertension Mother       VITAL SIGNS BP 129/82   Pulse 72   Temp 98.6 F (37 C)   Resp 19   Ht 5\' 11"  (1.803 m)   Wt 172 lb 3.2 oz (78.1 kg)   SpO2 98%   BMI 24.02 kg/m   Outpatient Encounter Medications as of 08/21/2023  Medication Sig   acetaminophen (TYLENOL) 325 MG tablet Take 325 mg by mouth in the morning and at bedtime.   amLODipine (NORVASC) 5 MG tablet Take 5 mg by mouth daily.    atorvastatin (LIPITOR) 40 MG tablet Take 40 mg by mouth daily.   baclofen (LIORESAL) 10 MG tablet Take 10 mg by mouth 3 (three) times daily.   Balsam [place]-Castor Oil (VENELEX) OINT Apply topically.   BD AUTOSHIELD DUO 30G X 5 MM MISC 3/16"   cholecalciferol (VITAMIN D3) 25 MCG (1000 UNIT) tablet Take 1,000 Units by mouth daily.   clotrimazole (LOTRIMIN) 1 % cream Apply 1 Application topically 2 (two) times daily.    Continuous Glucose Receiver (FREESTYLE LIBRE 3 READER) DEVI 1 Device by Does not apply route daily.   Continuous Glucose Sensor (FREESTYLE LIBRE 3 SENSOR) MISC 1 Device by Does not apply route every 14 (fourteen) days. Place 1 sensor on the skin every 14 days. Use to check glucose continuously   FLUoxetine (PROZAC) 10 MG tablet Take 10 mg by mouth daily.   FLUoxetine (PROZAC) 20 MG tablet Take 20 mg by mouth  daily.   insulin glargine (LANTUS) 100 UNIT/ML injection Inject 20 Units into the skin daily.   insulin lispro (HUMALOG) 100 UNIT/ML cartridge Inject 5 Units into the skin in the morning and at bedtime. Give 5 units  with breakfast and lunch give if he eats more than 50% of meals   lansoprazole (PREVACID SOLUTAB) 30 MG disintegrating tablet Take 30 mg by mouth daily. Give before meals   loratadine (CLARITIN) 10 MG tablet Take 10 mg by mouth daily.   losartan (COZAAR) 25 MG tablet Take 25 mg by mouth daily. DX: Hypertensive heart and chronic kidney disease without heart failure, with stage 1 through stage 4 chronic kidney disease, or unspecified chronic kidney disease-per NP note, code updated   melatonin 5 MG TABS Take 5 mg by mouth at bedtime.   NON FORMULARY Diet: Regular; please put food in bowls for all meals   Nutritional Supplements (ENSURE ENLIVE PO) Take by mouth daily.   rOPINIRole (REQUIP) 0.25 MG tablet Take 0.25 mg by mouth at bedtime.   sennosides-docusate sodium (SENOKOT-S) 8.6-50 MG tablet Take 1 tablet by mouth in the morning and at bedtime.   No facility-administered encounter medications on file as of 08/21/2023.     SIGNIFICANT DIAGNOSTIC EXAMS  PREVIOUS   07-27-22: dexa: t score -1.421  12-08-22: ct of head:  1. No acute intracranial abnormality. 2. Unchanged encephalomalacia in the left basal ganglia with prominent low-density changes in the left periventricular and subcortical white matter.  NO NEW EXAMS   LABS REVIEWED PREVIOUS:     09-06-22; urine  micro-albumin: 456.2 11-13-22: hgb A1c 7.5; ACR 382.3  03-30-23: wbc 8.1; hgb 15.8; hct 48.4 mcv 93.1 plt 182; glucose 123; bun 34; creat 1.63; k+ 3.9; na++ 138; ca 8.9; gfr 43; protein 6.2 albumin 3.0; hgb A1c 6.9; chol 98; ldl 59; trig 77; ldl 24; tsh 1.280   NO NEW LABS.   Review of Systems  Reason unable to perform ROS: aphasia.   Physical Exam Constitutional:      General: He is not in acute distress.    Appearance: He is well-developed. He is not diaphoretic.  Neck:     Thyroid: No thyromegaly.  Cardiovascular:     Rate and Rhythm: Normal rate and regular rhythm.     Pulses: Normal pulses.     Heart sounds: Normal heart sounds.  Pulmonary:     Effort: Pulmonary effort is normal. No respiratory distress.     Breath sounds: Normal breath sounds.  Abdominal:     General: Bowel sounds are normal. There is no distension.     Palpations: Abdomen is soft.     Tenderness: There is no abdominal tenderness.  Musculoskeletal:     Cervical back: Neck supple.     Right lower leg: No edema.     Left lower leg: No edema.     Comments: Right hemiplegia   Lymphadenopathy:     Cervical: No cervical adenopathy.  Skin:    General: Skin is warm and dry.  Neurological:     Mental Status: He is alert. Mental status is at baseline.  Psychiatric:        Mood and Affect: Mood normal.     ASSESSMENT/ PLAN:  TODAY:   Micro-albuminuria due to type 2 diabetes mellitus: 382.3 is on ARB  2. Major depression with psychotic features: is taking prozac 10 mg daily   3. Chronic constipation: is on senna s twice daily    PREVIOUS   4.  Chronic urine retention/bladder spasms: foley is out; not on medications  5. GERD without esophagitis: will continue prevacid 30 mg daily   6. Chronic non-seasonal allergic rhinitis: will continue claritin 10 mg daily   7. CKD stage 3 due to type 2 diabetes mellitus: bun 34; creat 1.63; gfr 43  8. Type 2 diabetes mellitus with peripheral vascular disease:  hgb A1c 6.9; will continue humalog 5 units with meals twice daily; lantus 20 units nightly is on statin and arb  9. Protein calorie malnutrition severe: weight is 170 pounds; albumin 3.0 will continue supplements as directed   10. History of nontraumatic subcortical hemorrhage left cerebral hemisphere/right spastic hemiplegia (05-22-21) is on baclofen 10 mg three times daily for spasticity  11. Restless leg syndrome: is on requip 0.25 mg daily   12. Chronic generalized pain: will continue tylenol 650 mg twice daily   13. Aortic atherosclerosis: (ct 01-04-19) is on statin  14. CAD native heart without angina: s/p cabg 2012; is on cozaar 25 mg daily   15. Hypertension associated with type 2 diabetes mellitus: b/p 129/82 will continue cozaar 25 mg daily and norvasc 5 mg daily    16. History of DVT: has completed eliquis therapy  17. Osteopenia: t score -1.421 is on supplements  18. Dyslipidemia associated with type 2 diabetes mellitus: ldl 57 will continue lipitor 40 mg daily     Synthia Innocent NP [facility]  call [phone]

## 2023-08-22 DIAGNOSIS — K5909 Other constipation: Secondary | ICD-10-CM | POA: Insufficient documentation

## 2023-09-19 DIAGNOSIS — E1151 Type 2 diabetes mellitus with diabetic peripheral angiopathy without gangrene: Secondary | ICD-10-CM | POA: Diagnosis not present

## 2023-09-19 DIAGNOSIS — L84 Corns and callosities: Secondary | ICD-10-CM | POA: Diagnosis not present

## 2023-09-19 DIAGNOSIS — L603 Nail dystrophy: Secondary | ICD-10-CM | POA: Diagnosis not present

## 2023-09-19 DIAGNOSIS — F5105 Insomnia due to other mental disorder: Secondary | ICD-10-CM | POA: Diagnosis not present

## 2023-09-19 DIAGNOSIS — L602 Onychogryphosis: Secondary | ICD-10-CM | POA: Diagnosis not present

## 2023-09-19 DIAGNOSIS — F333 Major depressive disorder, recurrent, severe with psychotic symptoms: Secondary | ICD-10-CM | POA: Diagnosis not present

## 2023-09-19 DIAGNOSIS — Z7984 Long term (current) use of oral hypoglycemic drugs: Secondary | ICD-10-CM | POA: Diagnosis not present

## 2023-09-22 ENCOUNTER — Non-Acute Institutional Stay (SKILLED_NURSING_FACILITY): Payer: Medicare PPO | Admitting: Adult Health

## 2023-09-22 ENCOUNTER — Encounter: Payer: Self-pay | Admitting: Adult Health

## 2023-09-22 DIAGNOSIS — K219 Gastro-esophageal reflux disease without esophagitis: Secondary | ICD-10-CM | POA: Diagnosis not present

## 2023-09-22 DIAGNOSIS — J3089 Other allergic rhinitis: Secondary | ICD-10-CM | POA: Diagnosis not present

## 2023-09-22 DIAGNOSIS — N3289 Other specified disorders of bladder: Secondary | ICD-10-CM | POA: Diagnosis not present

## 2023-09-22 DIAGNOSIS — R339 Retention of urine, unspecified: Secondary | ICD-10-CM

## 2023-09-22 NOTE — Progress Notes (Signed)
Location:  [facility] Room Number: 130 Place of Service:  SNF (31)   CODE STATUS: dnr   No Known Allergies  Chief Complaint  Patient presents with   Medical Management of Chronic Issues          Chronic urine retention/bladder spasms:     GERD without esophagitis:      Chronic non-seasonal allergic rhinitis:     HPI:  He is a 77 year old long term resident of this facility being seen for the management of his chronic illnesses:  Chronic urine retention/bladder spasms:     GERD without esophagitis:      Chronic non-seasonal allergic rhinitis. There are no reports of uncontrolled pain. His prevacid has been stopped. He does have periods of time where he is more lethargic.   Past Medical History:  Diagnosis Date   A-fib [facility])    a. Post-op afib after CABG 08/2012.   Acute gastric ulcer with hemorrhage    Acute respiratory failure with hypoxia (HCC)    Aphasia following cerebral infarction    CAD (coronary artery disease)    a. NSTEMI s/p stent to distal RCA 03/2000. b. Inf MI s/p emergent thrombectomy/stenting mid RCA 10/2000. c. NSTEMI s/p CABGx4 (LIMA-LAD, SVG-OM2, seq SVG-acute marginal and PD) 08/16/12 - course complicated by confusion, post-op AF, L pleural effusion with thoracentesis. d. Normal LV function by echo 08/2012.   Diverticulosis    DM with CKD    Dysphagia following cerebral infarction    Encephalopathy    Extended spectrum beta lactamase (ESBL) resistance    Generalized anxiety disorder    GERD (gastroesophageal reflux disease)    Hemiplegia and hemiparesis following cerebral infarction affecting right dominant side (HCC)    Hiatal hernia    HLD (hyperlipidemia)    HTN (hypertension)    Non-ST elevation (NSTEMI) myocardial infarction [facility])    Nontraumatic intracerebral hemorrhage in hemisphere, subcortical [facility])    Peripheral vascular disease (HCC)    a. Carotid dopplers neg 08/13/12. b. Pre-cabg ABIs - R=0.87 suggesting mild dz, L=1.29  possibly falsely elevated due to calcified vessels.   Pleural effusion    a. L pleural eff after CABG s/p thoracentesis 08/22/12.   Prostate cancer [facility])    Status post radiation treatment.   Prostatic hypertrophy    a. Hx of urinary retention, awaiting TURP.   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 Mild  MR by TEE 08/2012.    Past Surgical History:  Procedure Laterality Date   APPENDECTOMY     BACK SURGERY     CORONARY ARTERY BYPASS GRAFT  08/16/2012   Procedure: CORONARY ARTERY BYPASS GRAFTING (CABG);  Surgeon: Loreli Slot, MD;  Location: [facility] OR;  Service: Open Heart Surgery;  Laterality: N/A;   IR REPLC GASTRO/COLONIC TUBE PERCUT W/FLUORO  02/13/2019   LEFT HEART CATHETERIZATION WITH CORONARY ANGIOGRAM N/A 08/14/2012   Procedure: LEFT HEART CATHETERIZATION WITH CORONARY ANGIOGRAM;  Surgeon: Peter M Swaziland, MD;  Location: [facility] CATH LAB;  Service: Cardiovascular;  Laterality: N/A;    Social History   Socioeconomic History   Marital status: Married    Spouse name: Not on file   Number of children: Not on file   Years of education: Not on file   Highest education level: Not on file  Occupational History  Occupation: retired   Tobacco Use   Smoking status: Former    Current packs/day: 0.00    Types: Cigarettes    Quit date: 11/12/1972    Years since quitting: 50.8   Smokeless tobacco: Never  Vaping Use   Vaping status: Never Used  Substance and Sexual Activity   Alcohol use: No   Drug use: No   Sexual activity: Not Currently  Other Topics Concern   Not on file  Social History Narrative   He is a long term patient of [facility]    Social Determinants of Health   Financial Resource Strain: Low Risk  (07/01/2019)   Overall Financial Resource Strain (CARDIA)    Difficulty of Paying Living Expenses: Not hard at all  Food Insecurity: No Food Insecurity  (02/03/2021)   Received from [facility], [facility]   Hunger Vital Sign    Worried About Running Out of Food in the Last Year: Never true    Ran Out of Food in the Last Year: Never true  Transportation Needs: Unknown (07/01/2019)   PRAPARE - Transportation    Lack of Transportation (Medical): Patient declined    Lack of Transportation (Non-Medical): Patient declined  Physical Activity: Inactive (10/30/2019)   Exercise Vital Sign    Days of Exercise per Week: 0 days    Minutes of Exercise per Session: 0 min  Stress: Unknown (07/01/2019)   [employer] of Occupational Health - Occupational Stress Questionnaire    Feeling of Stress : Patient declined  Social Connections: Unknown (04/25/2022)   Received [facility], [facility]   Social Network    Social Network: Not on file  Intimate Partner Violence: Unknown (03/17/2022)   Received from [facility], [facility]   HITS    Physically Hurt: Not on file    Insult or Talk Down To: Not on file    Threaten Physical Harm: Not on file    Scream or Curse: Not on file   Family History  Problem Relation Age of Onset   Hypertension Mother       VITAL SIGNS BP 121/77   Pulse 67   Temp 97.6 F (36.4 C)   Resp 20   Ht 5\' 11"  (1.803 m)   Wt 172 lb 3.2 oz (78.1 kg)   SpO2 94%   BMI 24.02 kg/m   Outpatient Encounter Medications as of 09/22/2023  Medication Sig   acetaminophen (TYLENOL) 325 MG tablet Take 325 mg by mouth in the morning and at bedtime.   amLODipine (NORVASC) 5 MG tablet Take 5 mg by mouth daily.    atorvastatin (LIPITOR) 40 MG tablet Take 40 mg by mouth daily.   baclofen (LIORESAL) 10 MG tablet Take 10 mg by mouth 3 (three) times daily.   Balsam [place]-Castor Oil (VENELEX) OINT Apply topically.   BD AUTOSHIELD DUO 30G X 5 MM MISC 3/16"   cholecalciferol (VITAMIN D3) 25 MCG (1000 UNIT) tablet Take 1,000 Units by mouth daily.   clotrimazole (LOTRIMIN) 1 % cream Apply 1 Application topically 2 (two)  times daily.   Continuous Glucose Receiver (FREESTYLE LIBRE 3 READER) DEVI 1 Device by Does not apply route daily.   Continuous Glucose Sensor (FREESTYLE LIBRE 3 SENSOR) MISC 1 Device by Does not apply route every 14 (fourteen) days. Place 1 sensor on the skin every 14 days. Use to check glucose continuously   FLUoxetine (PROZAC) 10 MG tablet Take 10 mg by mouth daily.   FLUoxetine (PROZAC) 20 MG tablet Take  20 mg by mouth daily.   insulin glargine (LANTUS) 100 UNIT/ML injection Inject 20 Units into the skin daily.   insulin lispro (HUMALOG) 100 UNIT/ML cartridge Inject 5 Units into the skin in the morning and at bedtime. Give 5 units  with breakfast and lunch give if he eats more than 50% of meals   loratadine (CLARITIN) 10 MG tablet Take 10 mg by mouth daily.   losartan (COZAAR) 25 MG tablet Take 25 mg by mouth daily. DX: Hypertensive heart and chronic kidney disease without heart failure, with stage 1 through stage 4 chronic kidney disease, or unspecified chronic kidney disease-per NP note, code updated   melatonin 5 MG TABS Take 5 mg by mouth at bedtime.   NON FORMULARY Diet: Regular; please put food in bowls for all meals   Nutritional Supplements (ENSURE ENLIVE PO) Take by mouth daily.   rOPINIRole (REQUIP) 0.25 MG tablet Take 0.25 mg by mouth at bedtime.   sennosides-docusate sodium (SENOKOT-S) 8.6-50 MG tablet Take 1 tablet by mouth in the morning and at bedtime.   [DISCONTINUED] lansoprazole (PREVACID SOLUTAB) 30 MG disintegrating tablet Take 30 mg by mouth daily. Give before meals   No facility-administered encounter medications on file as of 09/22/2023.     SIGNIFICANT DIAGNOSTIC EXAMS   PREVIOUS   07-27-22: dexa: t score -1.421  12-08-22: ct of head:  1. No acute intracranial abnormality. 2. Unchanged encephalomalacia in the left basal ganglia with prominent low-density changes in the left periventricular and subcortical white matter.  NO NEW EXAMS   LABS REVIEWED  PREVIOUS:     09-06-22; urine micro-albumin: 456.2 11-13-22: hgb A1c 7.5; ACR 382.3  03-30-23: wbc 8.1; hgb 15.8; hct 48.4 mcv 93.1 plt 182; glucose 123; bun 34; creat 1.63; k+ 3.9; na++ 138; ca 8.9; gfr 43; protein 6.2 albumin 3.0; hgb A1c 6.9; chol 98; ldl 59; trig 77; ldl 24; tsh 1.280    Review of Systems  Reason unable to perform ROS: aphasia.   Physical Exam Constitutional:      General: He is not in acute distress.    Appearance: He is well-developed. He is not diaphoretic.  Neck:     Thyroid: No thyromegaly.  Cardiovascular:     Rate and Rhythm: Normal rate and regular rhythm.     Pulses: Normal pulses.     Heart sounds: Normal heart sounds.  Pulmonary:     Effort: Pulmonary effort is normal. No respiratory distress.     Breath sounds: Normal breath sounds.  Abdominal:     General: Bowel sounds are normal. There is no distension.     Palpations: Abdomen is soft.     Tenderness: There is no abdominal tenderness.  Musculoskeletal:     Cervical back: Neck supple.     Right lower leg: No edema.     Left lower leg: No edema.     Comments: Right hemiplegia    Lymphadenopathy:     Cervical: No cervical adenopathy.  Skin:    General: Skin is warm and dry.  Neurological:     Mental Status: He is alert. Mental status is at baseline.  Psychiatric:        Mood and Affect: Mood normal.     ASSESSMENT/ PLAN:  TODAY:   Chronic urine retention/bladder spasms: foley is out; not on medications  2. GERD without esophagitis: prevacid has been stopped  3. Chronic non-seasonal allergic rhinitis: will continue claritin 10 mg daily    PREVIOUS   4. CKD  stage 3 due to type 2 diabetes mellitus: bun 34; creat 1.63; gfr 43  5. Type 2 diabetes mellitus with peripheral vascular disease: hgb A1c 6.9; will continue humalog 5 units with meals twice daily; lantus 20 units nightly is on statin and arb  6. Protein calorie malnutrition severe: weight is 170 pounds; albumin 3.0 will  continue supplements as directed   7. History of nontraumatic subcortical hemorrhage left cerebral hemisphere/right spastic hemiplegia (05-22-21) is on baclofen 10 mg three times daily for spasticity  8. Restless leg syndrome: is on requip 0.25 mg daily   9. Chronic generalized pain: will continue tylenol 650 mg twice daily   10. Aortic atherosclerosis: (ct 01-04-19) is on statin  11. CAD native heart without angina: s/p cabg 2012; is on cozaar 25 mg daily   12. Hypertension associated with type 2 diabetes mellitus: b/p 129/82 will continue cozaar 25 mg daily and norvasc 5 mg daily    13. History of DVT: has completed eliquis therapy  14. Osteopenia: t score -1.421 is on supplements  15. Dyslipidemia associated with type 2 diabetes mellitus: ldl 57 will continue lipitor 40 mg daily   16. Micro-albuminuria due to type 2 diabetes mellitus: 382.3 is on ARB  17. Major depression with psychotic features: is taking prozac 30 mg daily   18. Chronic constipation: is on senna s twice daily      Synthia Innocent NP [facility]  call [phone]

## 2023-09-26 ENCOUNTER — Encounter: Payer: Self-pay | Admitting: Adult Health

## 2023-09-26 NOTE — Progress Notes (Signed)
This encounter was created in error - please disregard.

## 2023-09-27 ENCOUNTER — Encounter: Payer: Self-pay | Admitting: Adult Health

## 2023-09-27 NOTE — Progress Notes (Signed)
Location:  [facility] Room Number: 130 Place of Service:  SNF (31)   CODE STATUS: dnr   No Known Allergies  Chief Complaint  Patient presents with  . Medical Management of Chronic Issues              HPI:    Past Medical History:  Diagnosis Date  . A-fib (HCC)    a. Post-op afib after CABG 08/2012.  [name] Acute gastric ulcer with hemorrhage   . Acute respiratory failure with hypoxia (HCC)   . Aphasia following cerebral infarction   . CAD (coronary artery disease)    a. NSTEMI s/p stent to distal RCA 03/2000. b. Inf MI s/p emergent thrombectomy/stenting mid RCA 10/2000. c. NSTEMI s/p CABGx4 (LIMA-LAD, SVG-OM2, seq SVG-acute marginal and PD) 08/16/12 - course complicated by confusion, post-op AF, L pleural effusion with thoracentesis. d. Normal LV function by echo 08/2012.  . Diverticulosis   . DM with CKD   . Dysphagia following cerebral infarction   . Encephalopathy   . Extended spectrum beta lactamase (ESBL) resistance   . Generalized anxiety disorder   . GERD (gastroesophageal reflux disease)   . Hemiplegia and hemiparesis following cerebral infarction affecting right dominant side (HCC)   . Hiatal hernia   . HLD (hyperlipidemia)   . HTN (hypertension)   . Non-ST elevation (NSTEMI) myocardial infarction (HCC)   . Nontraumatic intracerebral hemorrhage in hemisphere, subcortical (HCC)   . Peripheral vascular disease (HCC)    a. Carotid dopplers neg 08/13/12. b. Pre-cabg ABIs - R=0.87 suggesting mild dz, L=1.29 possibly falsely elevated due to calcified vessels.  . Pleural effusion    a. L pleural eff after CABG s/p thoracentesis 08/22/12.  . Prostate cancer [facility])    Status post radiation treatment.  . Prostatic hypertrophy    a. Hx of urinary retention, awaiting TURP.  [name] Severe protein-calorie malnutrition (HCC)   . Stroke (HCC)   . Tobacco abuse   . 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 Valvular heart disease    a. Mild  MR  by TEE 08/2012.    Past Surgical History:  Procedure Laterality Date  . APPENDECTOMY    . BACK SURGERY    . CORONARY ARTERY BYPASS GRAFT  08/16/2012   Procedure: CORONARY ARTERY BYPASS GRAFTING (CABG);  Surgeon: Loreli Slot, MD;  Location[facility] OR;  Service: Open Heart Surgery;  Laterality: N/A;  . IR REPLC GASTRO/COLONIC TUBE PERCUT W/FLUORO  02/13/2019  . LEFT HEART CATHETERIZATION WITH CORONARY ANGIOGRAM N/A 08/14/2012   Procedure: LEFT HEART CATHETERIZATION WITH CORONARY ANGIOGRAM;  Surgeon: Peter M Swaziland, MD;  Location: [facility] CATH LAB;  Service: Cardiovascular;  Laterality: N/A;    Social History   Socioeconomic History  . Marital status: Married    Spouse name: Not on file  . Number of children: Not on file  . Years of education: Not on file  . Highest education level: Not on file  Occupational History  . Occupation: retired   Tobacco Use  . Smoking status: Former    Current packs/day: 0.00    Types: Cigarettes    Quit date: 11/12/1972    Years since quitting: 50.9  . Smokeless tobacco: Never  Vaping Use  . Vaping status: Never Used  Substance and Sexual Activity  . Alcohol use: No  . Drug use: No  . Sexual activity: Not Currently  Other Topics Concern  . Not on file  Social History Narrative  He is a long term patient of [facility]    Social Determinants of Health   Financial Resource Strain: Low Risk  (07/01/2019)   Overall Financial Resource Strain (CARDIA)   . Difficulty of Paying Living Expenses: Not hard at all  Food Insecurity: No Food Insecurity (02/03/2021)   Received from [facility], [facility]   Hunger Vital Sign   . Worried About Programme researcher, broadcasting/film/video in the Last Year: Never true   . Ran Out of Food in the Last Year: Never true  Transportation Needs: Unknown (07/01/2019)   PRAPARE - Transportation   . Lack of Transportation (Medical): Patient declined   . Lack of Transportation (Non-Medical): Patient declined  Physical Activity: Inactive (10/30/2019)    Exercise Vital Sign   . Days of Exercise per Week: 0 days   . Minutes of Exercise per Session: 0 min  Stress: Unknown (07/01/2019)   [employer] of Occupational Health - Occupational Stress Questionnaire   . Feeling of Stress : Patient declined  Social Connections: Unknown (04/25/2022)   Received from [facility], [facility]   Social Network   . Social Network: Not on file  Intimate Partner Violence: Unknown (03/17/2022)   Received from [facility], [facility]   HITS   . Physically Hurt: Not on file   . Insult or Talk Down To: Not on file   . Threaten Physical Harm: Not on file   . Scream or Curse: Not on file   Family History  Problem Relation Age of Onset  . Hypertension Mother       VITAL SIGNS BP 98/72   Pulse 76   Temp 97.7 F (36.5 C)   Resp 18   Ht 5\' 11"  (1.803 m)   Wt 172 lb 3.2 oz (78.1 kg)   SpO2 98%   BMI 24.02 kg/m   Outpatient Encounter Medications as of 09/27/2023  Medication Sig  . acetaminophen (TYLENOL) 325 MG tablet Take 325 mg by mouth in the morning and at bedtime.  [name] amLODipine (NORVASC) 5 MG tablet Take 5 mg by mouth daily.   [name] atorvastatin (LIPITOR) 40 MG tablet Take 40 mg by mouth daily.  . baclofen (LIORESAL) 10 MG tablet Take 10 mg by mouth 3 (three) times daily.  Lucilla Lame [place]-Castor Oil (VENELEX) OINT Apply topically.  . BD AUTOSHIELD DUO 30G X 5 MM MISC 3/16"  . cholecalciferol (VITAMIN D3) 25 MCG (1000 UNIT) tablet Take 1,000 Units by mouth daily.  . clotrimazole (LOTRIMIN) 1 % cream Apply 1 Application topically 2 (two) times daily.  . Continuous Glucose Receiver (FREESTYLE LIBRE 3 READER) DEVI 1 Device by Does not apply route daily.  . Continuous Glucose Sensor (FREESTYLE LIBRE 3 SENSOR) MISC 1 Device by Does not apply route every 14 (fourteen) days. Place 1 sensor on the skin every 14 days. Use to check glucose continuously  . FLUoxetine (PROZAC) 10 MG tablet Take 10 mg by mouth daily.  [name] FLUoxetine (PROZAC) 20 MG  tablet Take 20 mg by mouth daily.  . insulin glargine (LANTUS) 100 UNIT/ML injection Inject 20 Units into the skin daily.  . insulin lispro (HUMALOG) 100 UNIT/ML cartridge Inject 5 Units into the skin in the morning and at bedtime. Give 5 units  with breakfast and lunch give if he eats more than 50% of meals  . loratadine (CLARITIN) 10 MG tablet Take 10 mg by mouth daily.  [name] losartan (COZAAR) 25 MG tablet Take 25 mg by mouth daily. DX: Hypertensive  heart and chronic kidney disease without heart failure, with stage 1 through stage 4 chronic kidney disease, or unspecified chronic kidney disease-per NP note, code updated  . melatonin 5 MG TABS Take 5 mg by mouth at bedtime.  . NON FORMULARY Diet: Regular; please put food in bowls for all meals  . Nutritional Supplements (ENSURE ENLIVE PO) Take by mouth daily.  [name] rOPINIRole (REQUIP) 0.25 MG tablet Take 0.25 mg by mouth at bedtime.  . sennosides-docusate sodium (SENOKOT-S) 8.6-50 MG tablet Take 1 tablet by mouth in the morning and at bedtime.   No facility-administered encounter medications on file as of 09/27/2023.     SIGNIFICANT DIAGNOSTIC EXAMS       ASSESSMENT/ PLAN:     Synthia Innocent NP [facility] Adult Medicine  call [phone]   This encounter was created in error - please disregard.

## 2023-09-29 ENCOUNTER — Encounter: Payer: Self-pay | Admitting: Adult Health

## 2023-09-29 ENCOUNTER — Non-Acute Institutional Stay (SKILLED_NURSING_FACILITY): Payer: Medicare PPO | Admitting: Adult Health

## 2023-09-29 DIAGNOSIS — G8111 Spastic hemiplegia affecting right dominant side: Secondary | ICD-10-CM | POA: Diagnosis not present

## 2023-09-29 DIAGNOSIS — I4891 Unspecified atrial fibrillation: Secondary | ICD-10-CM | POA: Diagnosis not present

## 2023-09-29 DIAGNOSIS — I700 Atherosclerosis of aorta: Secondary | ICD-10-CM | POA: Diagnosis not present

## 2023-09-29 NOTE — Progress Notes (Signed)
Location:  [facility] Room Number: 130P Place of Service:  SNF (31)   CODE STATUS: DNR  No Known Allergies  Chief Complaint  Patient presents with   Acute Visit     care plan meeting    HPI:  We have come together for her care plan meeting. Family present. No BIMS; no mood. He is gerichair bound without falls. He requires dependent assist with his adls. He is incontinent of bladder and bowel. Dietary: weight is 172.2 pounds; weight without significant change in the past couple of months. Regular diet appetite is 1-75% of meals; is on supplement daily;  he requires setup for meals. Therapy: none at this time. Activities: spends time in room, personal activities. He continues to be followed for his chronic illnesses including:   Aortic atherosclerosis     Atrial fibrillation with normal ventricular rate    Right spastic hemiplegia  Past Medical History:  Diagnosis Date   A-fib (HCC)    a. Post-op afib after CABG 08/2012.   Acute gastric ulcer with hemorrhage    Acute respiratory failure with hypoxia (HCC)    Aphasia following cerebral infarction    CAD (coronary artery disease)    a. NSTEMI s/p stent to distal RCA 03/2000. b. Inf MI s/p emergent thrombectomy/stenting mid RCA 10/2000. c. NSTEMI s/p CABGx4 (LIMA-LAD, SVG-OM2, seq SVG-acute marginal and PD) 08/16/12 - course complicated by confusion, post-op AF, L pleural effusion with thoracentesis. d. Normal LV function by echo 08/2012.   Diverticulosis    DM with CKD    Dysphagia following cerebral infarction    Encephalopathy    Extended spectrum beta lactamase (ESBL) resistance    Generalized anxiety disorder    GERD (gastroesophageal reflux disease)    Hemiplegia and hemiparesis following cerebral infarction affecting right dominant side (HCC)    Hiatal hernia    HLD (hyperlipidemia)    HTN (hypertension)    Non-ST elevation (NSTEMI) myocardial infarction [facility])    Nontraumatic intracerebral hemorrhage in  hemisphere, subcortical [facility])    Peripheral vascular disease (HCC)    a. Carotid dopplers neg 08/13/12. b. Pre-cabg ABIs - R=0.87 suggesting mild dz, L=1.29 possibly falsely elevated due to calcified vessels.   Pleural effusion    a. L pleural eff after CABG s/p thoracentesis 08/22/12.   Prostate cancer [facility])    Status post radiation treatment.   Prostatic hypertrophy    a. Hx of urinary retention, awaiting TURP.   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 Mild  MR by TEE 08/2012.    Past Surgical History:  Procedure Laterality Date   APPENDECTOMY     BACK SURGERY     CORONARY ARTERY BYPASS GRAFT  08/16/2012   Procedure: CORONARY ARTERY BYPASS GRAFTING (CABG);  Surgeon: Loreli Slot, MD;  Location: [facility] OR;  Service: Open Heart Surgery;  Laterality: N/A;   IR REPLC GASTRO/COLONIC TUBE PERCUT W/FLUORO  02/13/2019   LEFT HEART CATHETERIZATION WITH CORONARY ANGIOGRAM N/A 08/14/2012   Procedure: LEFT HEART CATHETERIZATION WITH CORONARY ANGIOGRAM;  Surgeon: Peter M Swaziland, MD;  Location[facility] CATH LAB;  Service: Cardiovascular;  Laterality: N/A;    Social History   Socioeconomic History   Marital status: Married    Spouse name: Not on file   Number of children: Not on file   Years of education:  Not on file   Highest education level: Not on file  Occupational History   Occupation: retired   Tobacco Use   Smoking status: Former    Current packs/day: 0.00    Types: Cigarettes    Quit date: 11/12/1972    Years since quitting: 50.9   Smokeless tobacco: Never  Vaping Use   Vaping status: Never Used  Substance and Sexual Activity   Alcohol use: No   Drug use: No   Sexual activity: Not Currently  Other Topics Concern   Not on file  Social History Narrative   He is a long term patient of [facility]    Social Determinants of Health   Financial Resource Strain: Low  Risk  (07/01/2019)   Overall Financial Resource Strain (CARDIA)    Difficulty of Paying Living Expenses: Not hard at all  Food Insecurity: No Food Insecurity (02/03/2021)   [facility]   Hunger Vital Sign    Worried About Running Out of Food in the Last Year: Never true    Ran Out of Food in the Last Year: Never true  Transportation Needs: Unknown (07/01/2019)   PRAPARE - Transportation    Lack of Transportation (Medical): Patient declined    Lack of Transportation (Non-Medical): Patient declined  Physical Activity: Inactive (10/30/2019)   Exercise Vital Sign    Days of Exercise per Week: 0 days    Minutes of Exercise per Session: 0 min  Stress: Unknown (07/01/2019)   [employer] of Occupational Health - Occupational Stress Questionnaire    Feeling of Stress : Patient declined  Social Connections: Unknown (04/25/2022)   Received from [facility], [facility]   Social Network    Social Network: Not on file  Intimate Partner Violence: Unknown (03/17/2022)   Received from [facility], [facility]   HITS    Physically Hurt: Not on file    Insult or Talk Down To: Not on file    Threaten Physical Harm: Not on file    Scream or Curse: Not on file   Family History  Problem Relation Age of Onset   Hypertension Mother       VITAL SIGNS BP 98/72   Pulse 76   Temp 97.7 F (36.5 C) (Temporal)   Resp 18   Ht 5\' 11"  (1.803 m)   Wt 172 lb 3.2 oz (78.1 kg)   SpO2 98%   BMI 24.02 kg/m   Outpatient Encounter Medications as of 09/29/2023  Medication Sig   acetaminophen (TYLENOL) 325 MG tablet Take 325 mg by mouth in the morning and at bedtime.   amLODipine (NORVASC) 2.5 MG tablet Take 2.5 mg by mouth daily.   atorvastatin (LIPITOR) 40 MG tablet Take 40 mg by mouth daily.   baclofen (LIORESAL) 10 MG tablet Take 10 mg by mouth 3 (three) times daily.   Balsam [place]-Castor Oil (VENELEX) OINT Apply topically.   BD AUTOSHIELD DUO 30G X 5 MM MISC  3/16"   cholecalciferol (VITAMIN D3) 25 MCG (1000 UNIT) tablet Take 1,000 Units by mouth daily.   clotrimazole (LOTRIMIN) 1 % cream Apply 1 Application topically 2 (two) times daily.   Continuous Glucose Receiver (FREESTYLE LIBRE 3 READER) DEVI 1 Device by Does not apply route daily.   Continuous Glucose Sensor (FREESTYLE LIBRE 3 SENSOR) MISC 1 Device by Does not apply route every 14 (fourteen) days. Place 1 sensor on the skin every 14 days. Use to check glucose continuously   FLUoxetine (PROZAC) 20  MG tablet Take 20 mg by mouth daily.   insulin glargine (LANTUS) 100 UNIT/ML injection Inject 20 Units into the skin daily.   insulin lispro (HUMALOG) 100 UNIT/ML cartridge Inject 5 Units into the skin in the morning and at bedtime. Give 5 units  with breakfast and lunch give if he eats more than 50% of meals   loratadine (CLARITIN) 10 MG tablet Take 10 mg by mouth daily.   losartan (COZAAR) 25 MG tablet Take 25 mg by mouth daily. DX: Hypertensive heart and chronic kidney disease without heart failure, with stage 1 through stage 4 chronic kidney disease, or unspecified chronic kidney disease-per NP note, code updated   melatonin 5 MG TABS Take 5 mg by mouth at bedtime.   NON FORMULARY Diet: Regular; please put food in bowls for all meals   Nutritional Supplements (ENSURE ENLIVE PO) Take by mouth daily.   rOPINIRole (REQUIP) 0.25 MG tablet Take 0.25 mg by mouth at bedtime.   sennosides-docusate sodium (SENOKOT-S) 8.6-50 MG tablet Take 1 tablet by mouth in the morning and at bedtime.   [DISCONTINUED] FLUoxetine (PROZAC) 10 MG tablet Take 10 mg by mouth daily.   No facility-administered encounter medications on file as of 09/29/2023.     SIGNIFICANT DIAGNOSTIC EXAMS  PREVIOUS   07-27-22: dexa: t score -1.421  12-08-22: ct of head:  1. No acute intracranial abnormality. 2. Unchanged encephalomalacia in the left basal ganglia with prominent low-density changes in the left periventricular and  subcortical white matter.  NO NEW EXAMS   LABS REVIEWED PREVIOUS:     11-13-22: hgb A1c 7.5; ACR 382.3  03-30-23: wbc 8.1; hgb 15.8; hct 48.4 mcv 93.1 plt 182; glucose 123; bun 34; creat 1.63; k+ 3.9; na++ 138; ca 8.9; gfr 43; protein 6.2 albumin 3.0; hgb A1c 6.9; chol 98; ldl 59; trig 77; ldl 24; tsh 1.280   Review of Systems  Reason unable to perform ROS: aphasia.   Physical Exam Constitutional:      General: He is not in acute distress.    Appearance: He is well-developed. He is not diaphoretic.  Neck:     Thyroid: No thyromegaly.  Cardiovascular:     Rate and Rhythm: Normal rate and regular rhythm.     Pulses: Normal pulses.     Heart sounds: Normal heart sounds.  Pulmonary:     Effort: Pulmonary effort is normal. No respiratory distress.     Breath sounds: Normal breath sounds.  Abdominal:     General: Bowel sounds are normal. There is no distension.     Palpations: Abdomen is soft.     Tenderness: There is no abdominal tenderness.  Musculoskeletal:     Cervical back: Neck supple.     Right lower leg: No edema.     Left lower leg: No edema.     Comments: Right hemiplegia   Lymphadenopathy:     Cervical: No cervical adenopathy.  Skin:    General: Skin is warm and dry.  Neurological:     Mental Status: He is alert. Mental status is at baseline.  Psychiatric:        Mood and Affect: Mood normal.     ASSESSMENT/ PLAN:  TODAY  Aortic atherosclerosis  Atrial fibrillation with normal ventricular rate Right spastic hemiplegia  Will continue current medications Will continue current plan of care Will continue to monitor his status   Time spent with patient 40 minutes: dietary; plan of care medications.    Synthia Innocent NP [facility]  Adult Medicine   call [phone]

## 2023-10-10 ENCOUNTER — Encounter: Payer: Self-pay | Admitting: Adult Health

## 2023-10-10 ENCOUNTER — Non-Acute Institutional Stay (SKILLED_NURSING_FACILITY): Payer: Self-pay | Admitting: Adult Health

## 2023-10-10 DIAGNOSIS — E1151 Type 2 diabetes mellitus with diabetic peripheral angiopathy without gangrene: Secondary | ICD-10-CM

## 2023-10-10 NOTE — Progress Notes (Unsigned)
Location:  [facility] Room Number: 129 Place of Service:  SNF (31)   CODE STATUS: dnr   No Known Allergies  Chief Complaint  Patient presents with   Acute Visit    Diabetes     HPI:  He is presently taking lantus 20 units nightly and humalog 5 units with breakfast and lunch. His last hgb A1c 6.8  He has had some low cbg readings. He was not diaphoretic. There are no reports of excessive appetite; or thirst.   Past Medical History:  Diagnosis Date   A-fib (HCC)    a. Post-op afib after CABG 08/2012.   Acute gastric ulcer with hemorrhage    Acute respiratory failure with hypoxia (HCC)    Aphasia following cerebral infarction    CAD (coronary artery disease)    a. NSTEMI s/p stent to distal RCA 03/2000. b. Inf MI s/p emergent thrombectomy/stenting mid RCA 10/2000. c. NSTEMI s/p CABGx4 (LIMA-LAD, SVG-OM2, seq SVG-acute marginal and PD) 08/16/12 - course complicated by confusion, post-op AF, L pleural effusion with thoracentesis. d. Normal LV function by echo 08/2012.   Diverticulosis    DM with CKD    Dysphagia following cerebral infarction    Encephalopathy    Extended spectrum beta lactamase (ESBL) resistance    Generalized anxiety disorder    GERD (gastroesophageal reflux disease)    Hemiplegia and hemiparesis following cerebral infarction affecting right dominant side (HCC)    Hiatal hernia    HLD (hyperlipidemia)    HTN (hypertension)    Non-ST elevation (NSTEMI) myocardial infarction [facility])    Nontraumatic intracerebral hemorrhage in hemisphere, subcortical [facility])    Peripheral vascular disease (HCC)    a. Carotid dopplers neg 08/13/12. b. Pre-cabg ABIs - R=0.87 suggesting mild dz, L=1.29 possibly falsely elevated due to calcified vessels.   Pleural effusion    a. L pleural eff after CABG s/p thoracentesis 08/22/12.   Prostate cancer [facility])    Status post radiation treatment.   Prostatic hypertrophy    a. Hx of urinary retention, awaiting TURP.    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 Mild  MR by TEE 08/2012.    Past Surgical History:  Procedure Laterality Date   APPENDECTOMY     BACK SURGERY     CORONARY ARTERY BYPASS GRAFT  08/16/2012   Procedure: CORONARY ARTERY BYPASS GRAFTING (CABG);  Surgeon: Loreli Slot, MD;  Location: [facility] OR;  Service: Open Heart Surgery;  Laterality: N/A;   IR REPLC GASTRO/COLONIC TUBE PERCUT W/FLUORO  02/13/2019   LEFT HEART CATHETERIZATION WITH CORONARY ANGIOGRAM N/A 08/14/2012   Procedure: LEFT HEART CATHETERIZATION WITH CORONARY ANGIOGRAM;  Surgeon: Peter M Swaziland, MD;  Location: [facility] CATH LAB;  Service: Cardiovascular;  Laterality: N/A;    Social History   Socioeconomic History   Marital status: Married    Spouse name: Not on file   Number of children: Not on file   Years of education: Not on file   Highest education level: Not on file  Occupational History   Occupation: retired   Tobacco Use   Smoking status: Former    Current packs/day: 0.00    Types: Cigarettes    Quit date: 11/12/1972    Years since quitting: 50.9   Smokeless tobacco: Never  Vaping Use   Vaping status: Never Used  Substance and Sexual Activity   Alcohol use: No   Drug use: No   Sexual activity: Not Currently  Other Topics Concern   Not on file  Social History Narrative   He is a long term patient of [facility]    Social Determinants of Health   Financial Resource Strain: Low Risk  (07/01/2019)   Overall Financial Resource Strain (CARDIA)    Difficulty of Paying Living Expenses: Not hard at all  Food Insecurity: No Food Insecurity (02/03/2021)   Received from [facility], [facility]   Hunger Vital Sign    Worried About Running Out of Food in the Last Year: Never true    Ran Out of Food in the Last Year: Never true  Transportation Needs: Unknown (07/01/2019)   PRAPARE - Transportation     Lack of Transportation (Medical): Patient declined    Lack of Transportation (Non-Medical): Patient declined  Physical Activity: Inactive (10/30/2019)   Exercise Vital Sign    Days of Exercise per Week: 0 days    Minutes of Exercise per Session: 0 min  Stress: Unknown (07/01/2019)   [employer] of Occupational Health - Occupational Stress Questionnaire    Feeling of Stress : Patient declined  Social Connections: Unknown (04/25/2022)   Received from [facility]   Social Network    Social Network: Not on file  Intimate Partner Violence: Unknown (03/17/2022)   Received from [facility]   HITS    Physically Hurt: Not on file    Insult or Talk Down To: Not on file    Threaten Physical Harm: Not on file    Scream or Curse: Not on file   Family History  Problem Relation Age of Onset   Hypertension Mother       VITAL SIGNS BP 126/70   Pulse 70   Temp 97.8 F (36.6 C)   Resp 20   Ht 5\' 11"  (1.803 m)   Wt 172 lb 3.2 oz (78.1 kg)   SpO2 98%   BMI 24.02 kg/m   Outpatient Encounter Medications as of 10/10/2023  Medication Sig   acetaminophen (TYLENOL) 325 MG tablet Take 325 mg by mouth in the morning and at bedtime.   amLODipine (NORVASC) 2.5 MG tablet Take 2.5 mg by mouth daily.   atorvastatin (LIPITOR) 40 MG tablet Take 40 mg by mouth daily.   baclofen (LIORESAL) 10 MG tablet Take 10 mg by mouth 3 (three) times daily.   Balsam [place]-Castor Oil (VENELEX) OINT Apply topically.   BD AUTOSHIELD DUO 30G X 5 MM MISC 3/16"   cholecalciferol (VITAMIN D3) 25 MCG (1000 UNIT) tablet Take 1,000 Units by mouth daily.   clotrimazole (LOTRIMIN) 1 % cream Apply 1 Application topically 2 (two) times daily.   Continuous Glucose Receiver (FREESTYLE LIBRE 3 READER) DEVI 1 Device by Does not apply route daily.   Continuous Glucose Sensor (FREESTYLE LIBRE 3 SENSOR) MISC 1 Device by Does not apply route every 14 (fourteen) days. Place 1 sensor on the skin every  14 days. Use to check glucose continuously   FLUoxetine (PROZAC) 20 MG tablet Take 20 mg by mouth daily.   insulin glargine (LANTUS) 100 UNIT/ML injection Inject 20 Units into the skin daily.   insulin lispro (HUMALOG) 100 UNIT/ML cartridge Inject 5 Units into the skin in the morning and at bedtime. Give 5 units  with breakfast and lunch give if he eats more than 50% of meals   loratadine (CLARITIN) 10 MG tablet  Take 10 mg by mouth daily.   losartan (COZAAR) 25 MG tablet Take 25 mg by mouth daily. DX: Hypertensive heart and chronic kidney disease without heart failure, with stage 1 through stage 4 chronic kidney disease, or unspecified chronic kidney disease-per NP note, code updated   melatonin 5 MG TABS Take 5 mg by mouth at bedtime.   NON FORMULARY Diet: Regular; please put food in bowls for all meals   Nutritional Supplements (ENSURE ENLIVE PO) Take by mouth daily.   rOPINIRole (REQUIP) 0.25 MG tablet Take 0.25 mg by mouth at bedtime.   sennosides-docusate sodium (SENOKOT-S) 8.6-50 MG tablet Take 1 tablet by mouth in the morning and at bedtime.   No facility-administered encounter medications on file as of 10/10/2023.     SIGNIFICANT DIAGNOSTIC EXAMS  PREVIOUS   07-27-22: dexa: t score -1.421  12-08-22: ct of head:  1. No acute intracranial abnormality. 2. Unchanged encephalomalacia in the left basal ganglia with prominent low-density changes in the left periventricular and subcortical white matter.  NO NEW EXAMS   LABS REVIEWED PREVIOUS:     11-13-22: hgb A1c 7.5; ACR 382.3  03-30-23: wbc 8.1; hgb 15.8; hct 48.4 mcv 93.1 plt 182; glucose 123; bun 34; creat 1.63; k+ 3.9; na++ 138; ca 8.9; gfr 43; protein 6.2 albumin 3.0; hgb A1c 6.9; chol 98; ldl 59; trig 77; ldl 24; tsh 1.280   TODAY  07-20-23: hgb A1c  6.8   Review of Systems  Reason unable to perform ROS: aphasia.   Physical Exam Constitutional:      General: He is not in acute distress.    Appearance: He is  well-developed. He is not diaphoretic.  Neck:     Thyroid: No thyromegaly.  Cardiovascular:     Rate and Rhythm: Normal rate and regular rhythm.     Pulses: Normal pulses.     Heart sounds: Normal heart sounds.  Pulmonary:     Effort: Pulmonary effort is normal. No respiratory distress.     Breath sounds: Normal breath sounds.  Abdominal:     General: Bowel sounds are normal. There is no distension.     Palpations: Abdomen is soft.     Tenderness: There is no abdominal tenderness.  Musculoskeletal:     Cervical back: Neck supple.     Right lower leg: No edema.     Left lower leg: No edema.     Comments: Right hemiplegia   Lymphadenopathy:     Cervical: No cervical adenopathy.  Skin:    General: Skin is warm and dry.  Neurological:     Mental Status: He is alert. Mental status is at baseline.  Psychiatric:        Mood and Affect: Mood normal.      ASSESSMENT/ PLAN:  TODAY  Type 2 diabetes mellitus with peripheral vascular disease:  will lower lantus to 16 units and will continue to monitor him for hypoglycemia    Synthia Innocent NP [facility]  call [phone]

## 2023-10-16 ENCOUNTER — Other Ambulatory Visit (HOSPITAL_COMMUNITY)
Admission: RE | Admit: 2023-10-16 | Discharge: 2023-10-16 | Disposition: A | Discharge status: Home / Self Care | Payer: Medicare PPO | Source: Skilled Nursing Facility | Attending: Adult Health | Admitting: Adult Health

## 2023-10-16 DIAGNOSIS — E1151 Type 2 diabetes mellitus with diabetic peripheral angiopathy without gangrene: Secondary | ICD-10-CM | POA: Insufficient documentation

## 2023-10-16 LAB — LIPID PANEL
Cholesterol: 106 mg/dL (ref 0–200)
HDL: 27 mg/dL — ABNORMAL LOW (ref >40)
LDL Cholesterol: 65 mg/dL (ref 0–99)
Total CHOL/HDL Ratio: 3.9 {ratio}
Triglycerides: 72 mg/dL (ref <150)
VLDL: 14 mg/dL (ref 0–40)

## 2023-10-16 LAB — COMPREHENSIVE METABOLIC PANEL
ALT: 31 U/L (ref 0–44)
AST: 28 U/L (ref 15–41)
Albumin: 3.1 g/dL — ABNORMAL LOW (ref 3.5–5.0)
Alkaline Phosphatase: 101 U/L (ref 38–126)
Anion gap: 8 (ref 5–15)
BUN: 32 mg/dL — ABNORMAL HIGH (ref 8–23)
CO2: 28 mmol/L (ref 22–32)
Calcium: 9.1 mg/dL (ref 8.9–10.3)
Chloride: 101 mmol/L (ref 98–111)
Creatinine, Ser: 1.7 mg/dL — ABNORMAL HIGH (ref 0.61–1.24)
GFR, Estimated: 41 mL/min — ABNORMAL LOW (ref >60)
Glucose, Bld: 101 mg/dL — ABNORMAL HIGH (ref 70–99)
Potassium: 3.8 mmol/L (ref 3.5–5.1)
Sodium: 137 mmol/L (ref 135–145)
Total Bilirubin: 0.9 mg/dL (ref <1.2)
Total Protein: 6.5 g/dL (ref 6.5–8.1)

## 2023-10-16 LAB — CBC
HCT: 49.9 % (ref 39.0–52.0)
Hemoglobin: 15.9 g/dL (ref 13.0–17.0)
MCH: 29.9 pg (ref 26.0–34.0)
MCHC: 31.9 g/dL (ref 30.0–36.0)
MCV: 93.8 fL (ref 80.0–100.0)
Platelets: 176 10*3/uL (ref 150–400)
RBC: 5.32 MIL/uL (ref 4.22–5.81)
RDW: 12.4 % (ref 11.5–15.5)
WBC: 8.8 10*3/uL (ref 4.0–10.5)
nRBC: 0 % (ref 0.0–0.2)

## 2023-10-16 LAB — HEMOGLOBIN A1C
Hgb A1c MFr Bld: 6.8 % — ABNORMAL HIGH (ref 4.8–5.6)
Mean Plasma Glucose: 148.46 mg/dL

## 2023-10-16 LAB — TSH: TSH: 2.208 u[IU]/mL (ref 0.350–4.500)

## 2023-11-06 ENCOUNTER — Encounter: Payer: Self-pay | Admitting: Adult Health

## 2023-11-06 ENCOUNTER — Non-Acute Institutional Stay (SKILLED_NURSING_FACILITY): Payer: Medicare PPO | Admitting: Adult Health

## 2023-11-06 DIAGNOSIS — E1122 Type 2 diabetes mellitus with diabetic chronic kidney disease: Secondary | ICD-10-CM | POA: Diagnosis not present

## 2023-11-06 DIAGNOSIS — E43 Unspecified severe protein-calorie malnutrition: Secondary | ICD-10-CM | POA: Diagnosis not present

## 2023-11-06 DIAGNOSIS — E1151 Type 2 diabetes mellitus with diabetic peripheral angiopathy without gangrene: Secondary | ICD-10-CM

## 2023-11-06 DIAGNOSIS — N183 Chronic kidney disease, stage 3 unspecified: Secondary | ICD-10-CM

## 2023-11-06 NOTE — Progress Notes (Unsigned)
Location:  [facility] Room Number: 129 Place of Service:  SNF (31)   CODE STATUS: dnr   No Known Allergies  Chief Complaint  Patient presents with   Medical Management of Chronic Issues           CKD stage 3 due to type 2 diabetes mellitus: Type 2 diabetes mellitus with peripheral vascular disease: Protein calorie malnutrition, severe;      HPI:  He is a 77 year old long term resident of this facility being seen for the management of his chronic illnesses:   CKD stage 3 due to type 2 diabetes mellitus: Type 2 diabetes mellitus with peripheral vascular disease: Protein calorie malnutrition, severe. He has periods of time with lethargy and poor po intake. His weight remains stable.   Past Medical History:  Diagnosis Date   A-fib [facility])    a. Post-op afib after CABG 08/2012.   Acute gastric ulcer with hemorrhage    Acute respiratory failure with hypoxia (HCC)    Aphasia following cerebral infarction    CAD (coronary artery disease)    a. NSTEMI s/p stent to distal RCA 03/2000. b. Inf MI s/p emergent thrombectomy/stenting mid RCA 10/2000. c. NSTEMI s/p CABGx4 (LIMA-LAD, SVG-OM2, seq SVG-acute marginal and PD) 08/16/12 - course complicated by confusion, post-op AF, L pleural effusion with thoracentesis. d. Normal LV function by echo 08/2012.   Diverticulosis    DM with CKD    Dysphagia following cerebral infarction    Encephalopathy    Extended spectrum beta lactamase (ESBL) resistance    Generalized anxiety disorder    GERD (gastroesophageal reflux disease)    Hemiplegia and hemiparesis following cerebral infarction affecting right dominant side (HCC)    Hiatal hernia    HLD (hyperlipidemia)    HTN (hypertension)    Non-ST elevation (NSTEMI) myocardial infarction [facility])    Nontraumatic intracerebral hemorrhage in hemisphere, subcortical [facility])    Peripheral vascular disease (HCC)    a. Carotid dopplers neg 08/13/12. b. Pre-cabg ABIs - R=0.87 suggesting mild dz,  L=1.29 possibly falsely elevated due to calcified vessels.   Pleural effusion    a. L pleural eff after CABG s/p thoracentesis 08/22/12.   Prostate cancer [facility])    Status post radiation treatment.   Prostatic hypertrophy    a. Hx of urinary retention, awaiting TURP.   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 Mild  MR by TEE 08/2012.    Past Surgical History:  Procedure Laterality Date   APPENDECTOMY     BACK SURGERY     CORONARY ARTERY BYPASS GRAFT  08/16/2012   Procedure: CORONARY ARTERY BYPASS GRAFTING (CABG);  Surgeon: Loreli Slot, MD;  Location: [facility] OR;  Service: Open Heart Surgery;  Laterality: N/A;   IR REPLC GASTRO/COLONIC TUBE PERCUT W/FLUORO  02/13/2019   LEFT HEART CATHETERIZATION WITH CORONARY ANGIOGRAM N/A 08/14/2012   Procedure: LEFT HEART CATHETERIZATION WITH CORONARY ANGIOGRAM;  Surgeon: Peter M Swaziland, MD;  Location: [facility] CATH LAB;  Service: Cardiovascular;  Laterality: N/A;    Social History   Socioeconomic History   Marital status: Married    Spouse name: Not on file   Number of children: Not on file   Years of education: Not on file   Highest education level: Not on file  Occupational History   Occupation: retired  Tobacco Use   Smoking status: Former    Current packs/day: 0.00    Types: Cigarettes    Quit date: 11/12/1972    Years since quitting: 51.0   Smokeless tobacco: Never  Vaping Use   Vaping status: Never Used  Substance and Sexual Activity   Alcohol use: No   Drug use: No   Sexual activity: Not Currently  Other Topics Concern   Not on file  Social History Narrative   He is a long term patient of [facility]    Social Determinants of Health   Financial Resource Strain: Low Risk  (07/01/2019)   Overall Financial Resource Strain (CARDIA)    Difficulty of Paying Living Expenses: Not hard at all  Food Insecurity: No Food  Insecurity (02/03/2021)   Received [facility]   Hunger Vital Sign    Worried About Running Out of Food in the Last Year: Never true    Ran Out of Food in the Last Year: Never true  Transportation Needs: Unknown (07/01/2019)   PRAPARE - Transportation    Lack of Transportation (Medical): Patient declined    Lack of Transportation (Non-Medical): Patient declined  Physical Activity: Inactive (10/30/2019)   Exercise Vital Sign    Days of Exercise per Week: 0 days    Minutes of Exercise per Session: 0 min  Stress: Unknown (07/01/2019)   [employer] of Occupational Health - Occupational Stress Questionnaire    Feeling of Stress : Patient declined  Social Connections: Unknown (04/25/2022)   Received from [facility]   Social Network    Social Network: Not on file  Intimate Partner Violence: Unknown (03/17/2022)   Received from [facility]   HITS    Physically Hurt: Not on file    Insult or Talk Down To: Not on file    Threaten Physical Harm: Not on file    Scream or Curse: Not on file   Family History  Problem Relation Age of Onset   Hypertension Mother       VITAL SIGNS BP 123/75   Pulse 69   Temp (!) 97.4 F (36.3 C)   Resp 18   Ht 5\' 11"  (1.803 m)   Wt 172 lb (78 kg)   SpO2 96%   BMI 23.99 kg/m   Outpatient Encounter Medications as of 11/06/2023  Medication Sig   acetaminophen (TYLENOL) 325 MG tablet Take 325 mg by mouth in the morning and at bedtime.   amLODipine (NORVASC) 2.5 MG tablet Take 2.5 mg by mouth daily.   atorvastatin (LIPITOR) 40 MG tablet Take 40 mg by mouth daily.   baclofen (LIORESAL) 10 MG tablet Take 10 mg by mouth 3 (three) times daily.   Balsam [place]-Castor Oil (VENELEX) OINT Apply topically.   BD AUTOSHIELD DUO 30G X 5 MM MISC 3/16"   cholecalciferol (VITAMIN D3) 25 MCG (1000 UNIT) tablet Take 1,000 Units by mouth daily.   clotrimazole (LOTRIMIN) 1 % cream Apply 1 Application topically 2  (two) times daily.   Continuous Glucose Receiver (FREESTYLE LIBRE 3 READER) DEVI 1 Device by Does not apply route daily.   Continuous Glucose Sensor (FREESTYLE LIBRE 3 SENSOR) MISC 1 Device by Does not apply route every 14 (fourteen) days. Place 1 sensor on the skin every 14 days. Use to check glucose continuously   FLUoxetine (PROZAC) 20 MG tablet Take 20 mg by mouth daily.   insulin glargine (LANTUS) 100 UNIT/ML injection Inject 16 Units into the skin  daily.   insulin lispro (HUMALOG) 100 UNIT/ML cartridge Inject 5 Units into the skin in the morning and at bedtime. Give 5 units  with breakfast and lunch give if he eats more than 50% of meals   loratadine (CLARITIN) 10 MG tablet Take 10 mg by mouth daily.   losartan (COZAAR) 25 MG tablet Take 25 mg by mouth daily. DX: Hypertensive heart and chronic kidney disease without heart failure, with stage 1 through stage 4 chronic kidney disease, or unspecified chronic kidney disease-per NP note, code updated   melatonin 5 MG TABS Take 5 mg by mouth at bedtime.   NON FORMULARY Diet: Regular; please put food in bowls for all meals   Nutritional Supplements (ENSURE ENLIVE PO) Take by mouth daily.   rOPINIRole (REQUIP) 0.25 MG tablet Take 0.25 mg by mouth at bedtime.   sennosides-docusate sodium (SENOKOT-S) 8.6-50 MG tablet Take 1 tablet by mouth in the morning and at bedtime.   No facility-administered encounter medications on file as of 11/06/2023.     SIGNIFICANT DIAGNOSTIC EXAMS  PREVIOUS   07-27-22: dexa: t score -1.421  12-08-22: ct of head:  1. No acute intracranial abnormality. 2. Unchanged encephalomalacia in the left basal ganglia with prominent low-density changes in the left periventricular and subcortical white matter.  NO NEW EXAMS   LABS REVIEWED PREVIOUS:     11-13-22: hgb A1c 7.5; ACR 382.3  03-30-23: wbc 8.1; hgb 15.8; hct 48.4 mcv 93.1 plt 182; glucose 123; bun 34; creat 1.63; k+ 3.9; na++ 138; ca 8.9; gfr 43; protein 6.2  albumin 3.0; hgb A1c 6.9; chol 98; ldl 59; trig 77; ldl 24; tsh 1.280  07-20-23: hgb A1c  6.8  TODAY  10-16-23: wbc 8.8; hgb 15.9; hct 49.9; mcv 93.8 plt 176; glucose 101; bun 32; creat 1.70; k+ 3.8; na++ 137; ca 9.1 gfr 41; protein 6.5 albumin 3.1 hgb A1c 6.8; chol 106; ldl 65; trig 72; hdl 27; tsh 2.208     Review of Systems  Reason unable to perform ROS: aphasia.   Physical Exam Constitutional:      General: He is not in acute distress.    Appearance: He is well-developed. He is not diaphoretic.  Neck:     Thyroid: No thyromegaly.  Cardiovascular:     Rate and Rhythm: Normal rate and regular rhythm.     Pulses: Normal pulses.     Heart sounds: Normal heart sounds.  Pulmonary:     Effort: Pulmonary effort is normal. No respiratory distress.     Breath sounds: Normal breath sounds.  Abdominal:     General: Bowel sounds are normal. There is no distension.     Palpations: Abdomen is soft.     Tenderness: There is no abdominal tenderness.  Musculoskeletal:     Cervical back: Neck supple.     Right lower leg: No edema.     Left lower leg: No edema.     Comments: Right hemiplegia   Lymphadenopathy:     Cervical: No cervical adenopathy.  Skin:    General: Skin is warm and dry.  Neurological:     Mental Status: He is alert. Mental status is at baseline.  Psychiatric:        Mood and Affect: Mood normal.      ASSESSMENT/ PLAN:  TODAY:   CKD stage 3 due to type 2 diabetes mellitus: bun 32; creat 1.70; gfr 41  2. Type 2 diabetes mellitus with peripheral vascular disease: hgb A1c 6.8: will continue  humalog 5 units with meals twice daily lantus 20 units nightly is on statin and arb  3. Protein calorie malnutrition, severe; albumin 3.1 will continue supplements as directed.    PREVIOUS    4. History of nontraumatic subcortical hemorrhage left cerebral hemisphere/right spastic hemiplegia (05-22-21) is on baclofen 10 mg three times daily for spasticity  5. Restless leg  syndrome: is on requip 0.25 mg daily   6. Chronic generalized pain: will continue tylenol 650 mg twice daily   7. Aortic atherosclerosis: (ct 01-04-19) is on statin  8. CAD native heart without angina: s/p cabg 2012; is on cozaar 25 mg daily   9. Hypertension associated with type 2 diabetes mellitus: b/p 123/75 will continue cozaar 25 mg daily and norvasc 5 mg daily    10. History of DVT: has completed eliquis therapy  11. Osteopenia: t score -1.421 is on supplements  12. Dyslipidemia associated with type 2 diabetes mellitus: ldl 65 will continue lipitor 40 mg daily   13. Micro-albuminuria due to type 2 diabetes mellitus: 382.3 is on ARB  14. Major depression with psychotic features: is taking prozac 30 mg daily   15. Chronic constipation: is on senna s twice daily   16. Chronic urine retention/bladder spasms: foley is out; not on medications  17. GERD without esophagitis: prevacid has been stopped  18. Chronic non-seasonal allergic rhinitis: will continue claritin 10 mg daily      Synthia Innocent NP [facility]   call [phone]

## 2023-11-13 ENCOUNTER — Non-Acute Institutional Stay (SKILLED_NURSING_FACILITY): Payer: Self-pay | Admitting: Internal Medicine

## 2023-11-13 ENCOUNTER — Encounter: Payer: Self-pay | Admitting: Internal Medicine

## 2023-11-13 DIAGNOSIS — E785 Hyperlipidemia, unspecified: Secondary | ICD-10-CM | POA: Diagnosis not present

## 2023-11-13 DIAGNOSIS — I10 Essential (primary) hypertension: Secondary | ICD-10-CM | POA: Diagnosis not present

## 2023-11-13 DIAGNOSIS — E1151 Type 2 diabetes mellitus with diabetic peripheral angiopathy without gangrene: Secondary | ICD-10-CM

## 2023-11-13 DIAGNOSIS — I2510 Atherosclerotic heart disease of native coronary artery without angina pectoris: Secondary | ICD-10-CM

## 2023-11-13 DIAGNOSIS — I4891 Unspecified atrial fibrillation: Secondary | ICD-10-CM

## 2023-11-13 NOTE — Assessment & Plan Note (Signed)
Current LDL is 65 on 40 mg of atorvastatin.  This is adequate control.  He denies any anginal equivalents at this time.

## 2023-11-13 NOTE — Patient Instructions (Signed)
See assessment and plan under each diagnosis in the problem list and acutely for this visit 

## 2023-11-13 NOTE — Assessment & Plan Note (Signed)
He denies any anginal equivalent.  Continue to monitor.

## 2023-11-13 NOTE — Assessment & Plan Note (Signed)
Clinically rhythm is regular and rate well-controlled today.  No change indicated; continue to monitor.

## 2023-11-13 NOTE — Progress Notes (Unsigned)
NURSING HOME LOCATION:  [facility] ROOM NUMBER:  129 P  CODE STATUS:  DNR  PCP:  Synthia Innocent NP  This is a nursing facility follow up of chronic medical diagnoses & to document compliance with Regulation 483.30 (c) in The Long Term Care Survey Manual Phase 2 which mandates caregiver visit ( visits can alternate among physician, PA or NP as per statutes) within 10 days of 30 days / 60 days/ 90 days post admission to SNF date    Interim medical record and care since last SNF visit was updated with review of diagnostic studies and change in clinical status since last visit were documented.  HPI: He is a permanent resident of the facility with medical diagnoses of coronary disease with history of non-STEMI, history of atrial fibrillation, essential hypertension, dyslipidemia, insulin-dependent diabetes with vascular complications, GERD, history of diverticulosis, history of prostate cancer, history of stroke with right hemiparesis, and history of protein/caloric malnutrition. Labs are current as of 11/4.  He has mild prerenal azotemia with a BUN of 32.  CKD stage IIIb is present with a creatinine of 1.70 and GFR of 41.  Total protein is low normal at 6.5 and albumin is 3.1.  Lipids are excellent with an LDL of 65.  Diabetic control is also excellent with a value of 6.8%.  Thyroid is therapeutic with a TSH of 2.208.  Review of systems: Dementia invalidated responses.  He states that he is "fine" and denies any active symptoms. His wife states that there is no active treatment consists of benign sandwiches and peanut butter jelly sandwiches.  She states that he is intermittently very somnolent but this resolves with restorative sleep.  He has a lesion above the left eye which she scratches resulting in excoriation and eschar.  His wife states that this has been an issue for several years. She also states that when the peroneal genitourinary organs are cleaned she cries out as if  in pain.  Apparently the hole in the penis has closed but now the foreskin cannot be retracted.  Urology is aware of this issue.  Constitutional: No fever, significant weight change, fatigue  Eyes: No redness, discharge, pain, vision change ENT/mouth: No nasal congestion,  purulent discharge, earache, change in hearing, sore throat  Cardiovascular: No chest pain, palpitations, paroxysmal nocturnal dyspnea, claudication, edema  Respiratory: No cough, sputum production, hemoptysis, DOE, significant snoring, apnea   Gastrointestinal: No heartburn, dysphagia, abdominal pain, nausea /vomiting, rectal bleeding, melena, change in bowels Genitourinary: No dysuria, hematuria, pyuria, incontinence, nocturia Musculoskeletal: No joint stiffness, joint swelling, weakness, pain Dermatologic: No rash, pruritus, change in appearance of skin Neurologic: No dizziness, headache, syncope, seizures, numbness, tingling Psychiatric: No significant anxiety, depression, insomnia, anorexia Endocrine: No change in hair/skin/nails, excessive thirst, excessive hunger, excessive urination  Hematologic/lymphatic: No significant bruising, lymphadenopathy, abnormal bleeding Allergy/immunology: No itchy/watery eyes, significant sneezing, urticaria, angioedema  Physical exam:  Pertinent or positive findings: Facies tend to be blank.  There is minimal spontaneous speech.  When his wife asked what he wanted for Christmas he did respond "my 2 front teeth."  Pattern alopecia is present.  Eyebrows are decreased in density.  There is a small polypoid lesion above the left medial eyebrow with eschar.  There is also a small polypoid lesion at the right lateral eyebrow.  He is missing multiple teeth including the 2 anterior incisors.  Otherwise dentition is immaculate.  There is slight decrease in the right nasolabial fold compared to the left.  Despite history of atrial fibrillation; rhythm is clinically regular and rate controlled.  Breath  sounds are decreased.  Pedal pulses are decreased.  There is marked interosseous wasting and limb atrophy.  Strength opposition appears to be greater on the left than the right. General appearance: Adequately nourished; no acute distress, increased work of breathing is present.   Lymphatic: No lymphadenopathy about the head, neck, axilla. Eyes: No conjunctival inflammation or lid edema is present. There is no scleral icterus. Ears:  External ear exam shows no significant lesions or deformities.   Nose:  External nasal examination shows no deformity or inflammation. Nasal mucosa are pink and moist without lesions, exudates Oral exam:  Lips and gums are healthy appearing. There is no oropharyngeal erythema or exudate. Neck:  No thyromegaly, masses, tenderness noted.    Heart:  Normal rate and regular rhythm. S1 and S2 normal without gallop, murmur, click, rub .  Lungs: Chest clear to auscultation without wheezes, rhonchi, rales, rubs. Abdomen: Bowel sounds are normal. Abdomen is soft and nontender with no organomegaly, hernias, masses. GU: Deferred  Extremities:  No cyanosis, clubbing, edema  Neurologic exam : Cn 2-7 intact Strength equal  in upper & lower extremities Balance, Rhomberg, finger to nose testing could not be completed due to clinical state Deep tendon reflexes are equal Skin: Warm & dry w/o tenting. No significant lesions or rash.  See summary under each active problem in the Problem List with associated updated therapeutic plan

## 2023-11-13 NOTE — Assessment & Plan Note (Signed)
Diabetic control is excellent with an A1c which is stable at 6.8%.  No change in present therapy indicated.  Pedal pulses are decreased but no ischemic changes present.  Residual asymmetric weakness related to his prior stroke is present and unchanged.

## 2023-11-13 NOTE — Assessment & Plan Note (Signed)
BP controlled; no change in antihypertensive medications

## 2023-11-27 DIAGNOSIS — H524 Presbyopia: Secondary | ICD-10-CM | POA: Diagnosis not present

## 2023-11-27 DIAGNOSIS — Z961 Presence of intraocular lens: Secondary | ICD-10-CM | POA: Diagnosis not present

## 2023-11-27 DIAGNOSIS — H2511 Age-related nuclear cataract, right eye: Secondary | ICD-10-CM | POA: Diagnosis not present

## 2023-11-27 DIAGNOSIS — E113293 Type 2 diabetes mellitus with mild nonproliferative diabetic retinopathy without macular edema, bilateral: Secondary | ICD-10-CM | POA: Diagnosis not present

## 2023-11-28 DIAGNOSIS — F333 Major depressive disorder, recurrent, severe with psychotic symptoms: Secondary | ICD-10-CM | POA: Diagnosis not present

## 2023-11-28 DIAGNOSIS — F5105 Insomnia due to other mental disorder: Secondary | ICD-10-CM | POA: Diagnosis not present

## 2023-12-25 DIAGNOSIS — F5105 Insomnia due to other mental disorder: Secondary | ICD-10-CM | POA: Diagnosis not present

## 2023-12-25 DIAGNOSIS — F333 Major depressive disorder, recurrent, severe with psychotic symptoms: Secondary | ICD-10-CM | POA: Diagnosis not present

## 2023-12-26 ENCOUNTER — Encounter: Payer: Self-pay | Admitting: Adult Health

## 2023-12-26 ENCOUNTER — Non-Acute Institutional Stay (SKILLED_NURSING_FACILITY): Payer: Medicare PPO | Admitting: Adult Health

## 2023-12-26 DIAGNOSIS — I6930 Unspecified sequelae of cerebral infarction: Secondary | ICD-10-CM | POA: Diagnosis not present

## 2023-12-26 DIAGNOSIS — R52 Pain, unspecified: Secondary | ICD-10-CM

## 2023-12-26 DIAGNOSIS — G2581 Restless legs syndrome: Secondary | ICD-10-CM

## 2023-12-26 DIAGNOSIS — G8111 Spastic hemiplegia affecting right dominant side: Secondary | ICD-10-CM

## 2023-12-26 NOTE — Progress Notes (Signed)
 Location:  [facility] Room Number: 130 Place of Service:  SNF (31)   CODE STATUS: dnr   No Known Allergies  Chief Complaint  Patient presents with   Medical Management of Chronic Issues         History of nontraumatic subcortical hemorrhage left cerebral hemisphere/right spastic hemiplegia     Restless leg syndrome;   Chronic generalized pain     HPI:  He is a 78 year old long term resident of this facility is being seen for the management of his chronic illnesses: History of nontraumatic subcortical hemorrhage left cerebral hemisphere/right spastic hemiplegia     Restless leg syndrome;   Chronic generalized pain. He continues to get out of bed daily; there are no reports of uncontrolled pain.   Past Medical History:  Diagnosis Date   A-fib [facility])    a. Post-op afib after CABG 08/2012.   Acute gastric ulcer with hemorrhage    Acute respiratory failure with hypoxia (HCC)    Aphasia following cerebral infarction    CAD (coronary artery disease)    a. NSTEMI s/p stent to distal RCA 03/2000. b. Inf MI s/p emergent thrombectomy/stenting mid RCA 10/2000. c. NSTEMI s/p CABGx4 (LIMA-LAD, SVG-OM2, seq SVG-acute marginal and PD) 08/16/12 - course complicated by confusion, post-op AF, L pleural effusion with thoracentesis. d. Normal LV function by echo 08/2012.   Diverticulosis    DM with CKD    Dysphagia following cerebral infarction    Encephalopathy    Extended spectrum beta lactamase (ESBL) resistance    Generalized anxiety disorder    GERD (gastroesophageal reflux disease)    Hemiplegia and hemiparesis following cerebral infarction affecting right dominant side (HCC)    Hiatal hernia    HLD (hyperlipidemia)    HTN (hypertension)    Non-ST elevation (NSTEMI) myocardial infarction [facility])    Nontraumatic intracerebral hemorrhage in hemisphere, subcortical [facility])    Peripheral vascular disease (HCC)    a. Carotid dopplers neg 08/13/12. b. Pre-cabg ABIs - R=0.87  suggesting mild dz, L=1.29 possibly falsely elevated due to calcified vessels.   Pleural effusion    a. L pleural eff after CABG s/p thoracentesis 08/22/12.   Prostate cancer [facility])    Status post radiation treatment.   Prostatic hypertrophy    a. Hx of urinary retention, awaiting TURP.   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 Mild  MR by TEE 08/2012.    Past Surgical History:  Procedure Laterality Date   APPENDECTOMY     BACK SURGERY     CORONARY ARTERY BYPASS GRAFT  08/16/2012   Procedure: CORONARY ARTERY BYPASS GRAFTING (CABG);  Surgeon: Elspeth JAYSON Millers, MD;  [facility]: [facility] OR;  Service: Open Heart Surgery;  Laterality: N/A;   IR REPLC GASTRO/COLONIC TUBE PERCUT W/FLUORO  02/13/2019   LEFT HEART CATHETERIZATION WITH CORONARY ANGIOGRAM N/A 08/14/2012   Procedure: LEFT HEART CATHETERIZATION WITH CORONARY ANGIOGRAM;  Surgeon: Peter M Jordan, MD;  Location: [facility] CATH LAB;  Service: Cardiovascular;  Laterality: N/A;    Social History   Socioeconomic History   Marital status: Married    Spouse name: Not on file   Number of children: Not on file   Years of education: Not on file   Highest education level: Not on file  Occupational History   Occupation: retired  Tobacco Use   Smoking status: Former    Current packs/day: 0.00    Types: Cigarettes    Quit date: 11/12/1972    Years since quitting: 51.1   Smokeless tobacco: Never  Vaping Use   Vaping status: Never Used  Substance and Sexual Activity   Alcohol use: No   Drug use: No   Sexual activity: Not Currently  Other Topics Concern   Not on file  Social History Narrative   He is a long term patient of [facility]    Social Drivers of Corporate Investment Banker Strain: Low Risk  (07/01/2019)   Overall Financial Resource Strain (CARDIA)    Difficulty of Paying Living Expenses: Not hard at all  Food  Insecurity: No Food Insecurity (02/03/2021)   Received from [facility], [facility]   Hunger Vital Sign    Worried About Running Out of Food in the Last Year: Never true    Ran Out of Food in the Last Year: Never true  Transportation Needs: Unknown (07/01/2019)   PRAPARE - Transportation    Lack of Transportation (Medical): Patient declined    Lack of Transportation (Non-Medical): Patient declined  Physical Activity: Inactive (10/30/2019)   Exercise Vital Sign    Days of Exercise per Week: 0 days    Minutes of Exercise per Session: 0 min  Stress: Unknown (07/01/2019)   [employer] of Occupational Health - Occupational Stress Questionnaire    Feeling of Stress : Patient declined  Social Connections: Unknown (04/25/2022)   Received from [facility], [facility]   Social Network    Social Network: Not on file  Intimate Partner Violence: Unknown (03/17/2022)   Received from [employer], [facility]   HITS    Physically Hurt: Not on file    Insult or Talk Down To: Not on file    Threaten Physical Harm: Not on file    Scream or Curse: Not on file   Family History  Problem Relation Age of Onset   Hypertension Mother       VITAL SIGNS BP 125/81   Pulse 75   Temp 98.4 F (36.9 C)   Resp 18   Ht 5' 8 (1.727 m)   Wt 156 lb 9.6 oz (71 kg)   SpO2 95%   BMI 23.81 kg/m   Outpatient Encounter Medications as of 12/26/2023  Medication Sig   acetaminophen  (TYLENOL ) 325 MG tablet Take 325 mg by mouth in the morning and at bedtime.   amLODipine  (NORVASC ) 2.5 MG tablet Take 2.5 mg by mouth daily.   atorvastatin  (LIPITOR ) 40 MG tablet Take 40 mg by mouth daily.   baclofen (LIORESAL) 10 MG tablet Take 10 mg by mouth 3 (three) times daily.   Balsam [place]-Castor Oil (VENELEX) OINT Apply topically.   BD AUTOSHIELD DUO 30G X 5 MM MISC 3/16   cholecalciferol (VITAMIN D3) 25 MCG (1000 UNIT) tablet Take 1,000 Units by mouth daily.   clotrimazole (LOTRIMIN) 1 % cream Apply 1  Application topically 2 (two) times daily.   Continuous Glucose Receiver (FREESTYLE LIBRE 3 READER) DEVI 1 Device by Does not apply route daily.   Continuous Glucose Sensor (FREESTYLE LIBRE 3 SENSOR) MISC 1 Device by Does not apply route every 14 (fourteen) days. Place 1 sensor on the skin every 14 days. Use to check glucose continuously   FLUoxetine  (PROZAC ) 20 MG tablet Take 20 mg by mouth daily.   insulin  glargine (LANTUS ) 100 UNIT/ML injection Inject 16 Units into the  skin daily.   insulin  lispro (HUMALOG ) 100 UNIT/ML cartridge Inject 5 Units into the skin in the morning and at bedtime. Give 5 units  with breakfast and lunch give if he eats more than 50% of meals   loratadine (CLARITIN) 10 MG tablet Take 10 mg by mouth daily.   losartan (COZAAR) 25 MG tablet Take 25 mg by mouth daily. DX: Hypertensive heart and chronic kidney disease without heart failure, with stage 1 through stage 4 chronic kidney disease, or unspecified chronic kidney disease-per NP note, code updated   melatonin 5 MG TABS Take 5 mg by mouth at bedtime.   NON FORMULARY Diet: Regular; please put food in bowls for all meals   Nutritional Supplements (ENSURE ENLIVE PO) Take by mouth daily.   rOPINIRole (REQUIP) 0.25 MG tablet Take 0.25 mg by mouth at bedtime.   sennosides-docusate sodium  (SENOKOT-S) 8.6-50 MG tablet Take 1 tablet by mouth in the morning and at bedtime.   No facility-administered encounter medications on file as of 12/26/2023.     SIGNIFICANT DIAGNOSTIC EXAMS  PREVIOUS   07-27-22: dexa: t score -1.421  NO NEW EXAMS   LABS REVIEWED PREVIOUS:     03-30-23: wbc 8.1; hgb 15.8; hct 48.4 mcv 93.1 plt 182; glucose 123; bun 34; creat 1.63; k+ 3.9; na++ 138; ca 8.9; gfr 43; protein 6.2 albumin  3.0; hgb A1c 6.9; chol 98; ldl 59; trig 77; ldl 24; tsh 1.280  07-20-23: hgb A1c  6.8 10-16-23: wbc 8.8; hgb 15.9; hct 49.9; mcv 93.8 plt 176; glucose 101; bun 32; creat 1.70; k+ 3.8; na++ 137; ca 9.1 gfr 41; protein 6.5  albumin  3.1 hgb A1c 6.8; chol 106; ldl 65; trig 72; hdl 27; tsh 2.208    NO NEW LABS.   Review of Systems  Reason unable to perform ROS: aphasia.   Physical Exam Constitutional:      General: He is not in acute distress.    Appearance: He is well-developed. He is not diaphoretic.  Neck:     Thyroid : No thyromegaly.  Cardiovascular:     Rate and Rhythm: Normal rate and regular rhythm.     Pulses: Normal pulses.     Heart sounds: Normal heart sounds.  Pulmonary:     Effort: Pulmonary effort is normal. No respiratory distress.     Breath sounds: Normal breath sounds.  Abdominal:     General: Bowel sounds are normal. There is no distension.     Palpations: Abdomen is soft.     Tenderness: There is no abdominal tenderness.  Musculoskeletal:     Cervical back: Neck supple.     Comments: Right hemiplegia   Lymphadenopathy:     Cervical: No cervical adenopathy.  Skin:    General: Skin is warm and dry.  Neurological:     Mental Status: He is alert. Mental status is at baseline.  Psychiatric:        Mood and Affect: Mood normal.       ASSESSMENT/ PLAN:  TODAY:   History of nontraumatic subcortical hemorrhage left cerebral hemisphere/right spastic hemiplegia  (05-22-21) is on baclofen 10 mg three times daily for spasticity   2. Restless leg syndrome; is not on medications at this time  3. Chronic generalized pain: will continue tylenol  650 mg twice daily    PREVIOUS    4. Aortic atherosclerosis: (ct 01-04-19) is on statin  5. CAD native heart without angina: s/p cabg 2012; is on cozaar 25 mg daily   6. Hypertension associated  with type 2 diabetes mellitus: b/p 125/81 will continue cozaar 25 mg daily and norvasc  5 mg daily    7. History of DVT: has completed eliquis  therapy  8. Osteopenia: t score -1.421 is on supplements  9. Dyslipidemia associated with type 2 diabetes mellitus: ldl 65 will continue lipitor  40 mg daily   10. Micro-albuminuria due to type 2 diabetes  mellitus: 382.3 is on ARB  11. Major depression with psychotic features: is taking prozac  30 mg daily   12. Chronic constipation: is on senna s twice daily   13. Chronic urine retention/bladder spasms: foley is out; not on medications  14. GERD without esophagitis: prevacid  has been stopped  15. Chronic non-seasonal allergic rhinitis: will continue claritin 10 mg daily   16. CKD stage 3 due to type 2 diabetes mellitus: bun 32; creat 1.70; gfr 41  17. Type 2 diabetes mellitus with peripheral vascular disease: hgb A1c 6.8: will continue humalog  5 units with meals twice daily lantus  20 units nightly is on statin and arb  18. Protein calorie malnutrition, severe; albumin  3.1 will continue supplements as directed.      Tanner Seip NP [facility]  call [phone]

## 2023-12-29 ENCOUNTER — Non-Acute Institutional Stay (SKILLED_NURSING_FACILITY): Payer: Self-pay | Admitting: Adult Health

## 2023-12-29 ENCOUNTER — Encounter: Payer: Self-pay | Admitting: Adult Health

## 2023-12-29 DIAGNOSIS — G8111 Spastic hemiplegia affecting right dominant side: Secondary | ICD-10-CM | POA: Diagnosis not present

## 2023-12-29 DIAGNOSIS — I4891 Unspecified atrial fibrillation: Secondary | ICD-10-CM | POA: Diagnosis not present

## 2023-12-29 DIAGNOSIS — I700 Atherosclerosis of aorta: Secondary | ICD-10-CM

## 2023-12-29 NOTE — Progress Notes (Signed)
Location:  [facility] Room Number: 130 Place of Service:  SNF (31)   CODE STATUS: dnr   No Known Allergies  Chief Complaint  Patient presents with   Acute Visit    Care plan meeting.     HPI:  We have come together for her care plan meeting. Family present. No BIMS mood 0/30. Is out of bed daily without falls. He is dependent assist for his adl care. He is incontinent of bladder and bowel. Dietary: requires setup for meals. Food is bowls sandwiches on trays appetite 1-50%. His wife would like for him to have magic cup on trays.  Therapy: none at this time. His left leg is stiff and is painful. His wife would like for therapy to see him for stretching . He is taking baclofen 5 mg twice daily. He will continue to be followed for his chronic illnesses including:   Aortic atherosclerosis   Right spastic hemiplegia   Atrial fibrillation with normal ventricular response   Past Medical History:  Diagnosis Date   A-fib (HCC)    a. Post-op afib after CABG 08/2012.   Acute gastric ulcer with hemorrhage    Acute respiratory failure with hypoxia (HCC)    Aphasia following cerebral infarction    CAD (coronary artery disease)    a. NSTEMI s/p stent to distal RCA 03/2000. b. Inf MI s/p emergent thrombectomy/stenting mid RCA 10/2000. c. NSTEMI s/p CABGx4 (LIMA-LAD, SVG-OM2, seq SVG-acute marginal and PD) 08/16/12 - course complicated by confusion, post-op AF, L pleural effusion with thoracentesis. d. Normal LV function by echo 08/2012.   Diverticulosis    DM with CKD    Dysphagia following cerebral infarction    Encephalopathy    Extended spectrum beta lactamase (ESBL) resistance    Generalized anxiety disorder    GERD (gastroesophageal reflux disease)    Hemiplegia and hemiparesis following cerebral infarction affecting right dominant side (HCC)    Hiatal hernia    HLD (hyperlipidemia)    HTN (hypertension)    Non-ST elevation (NSTEMI) myocardial infarction [facility])     Nontraumatic intracerebral hemorrhage in hemisphere, subcortical [facility])    Peripheral vascular disease (HCC)    a. Carotid dopplers neg 08/13/12. b. Pre-cabg ABIs - R=0.87 suggesting mild dz, L=1.29 possibly falsely elevated due to calcified vessels.   Pleural effusion    a. L pleural eff after CABG s/p thoracentesis 08/22/12.   Prostate cancer [facility])    Status post radiation treatment.   Prostatic hypertrophy    a. Hx of urinary retention, awaiting TURP.   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 Mild  MR by TEE 08/2012.    Past Surgical History:  Procedure Laterality Date   APPENDECTOMY     BACK SURGERY     CORONARY ARTERY BYPASS GRAFT  08/16/2012   Procedure: CORONARY ARTERY BYPASS GRAFTING (CABG);  Surgeon: Loreli Slot, MD;  Location: [facility] OR;  Service: Open Heart Surgery;  Laterality: N/A;   IR REPLC GASTRO/COLONIC TUBE PERCUT W/FLUORO  02/13/2019   LEFT HEART CATHETERIZATION WITH CORONARY ANGIOGRAM N/A 08/14/2012   Procedure: LEFT HEART CATHETERIZATION WITH CORONARY ANGIOGRAM;  Surgeon: Peter M Swaziland, MD;  Location: [facility] CATH LAB;  Service: Cardiovascular;  Laterality: N/A;    Social History   Socioeconomic History   Marital status: Married  Spouse name: Not on file   Number of children: Not on file   Years of education: Not on file   Highest education level: Not on file  Occupational History   Occupation: retired   Tobacco Use   Smoking status: Former    Current packs/day: 0.00    Types: Cigarettes    Quit date: 11/12/1972    Years since quitting: 51.1   Smokeless tobacco: Never  Vaping Use   Vaping status: Never Used  Substance and Sexual Activity   Alcohol use: No   Drug use: No   Sexual activity: Not Currently  Other Topics Concern   Not on file  Social History Narrative   He is a long term patient of [facility]    Social Drivers of Health    Financial Resource Strain: Low Risk  (07/01/2019)   Overall Financial Resource Strain (CARDIA)    Difficulty of Paying Living Expenses: Not hard at all  Food Insecurity: No Food Insecurity (02/03/2021)   Received from [facility], [facility]   Hunger Vital Sign    Worried About Running Out of Food in the Last Year: Never true    Ran Out of Food in the Last Year: Never true  Transportation Needs: Unknown (07/01/2019)   PRAPARE - Transportation    Lack of Transportation (Medical): Patient declined    Lack of Transportation (Non-Medical): Patient declined  Physical Activity: Inactive (10/30/2019)   Exercise Vital Sign    Days of Exercise per Week: 0 days    Minutes of Exercise per Session: 0 min  Stress: Unknown (07/01/2019)   [employer] of Occupational Health - Occupational Stress Questionnaire    Feeling of Stress : Patient declined  Social Connections: Unknown (04/25/2022)   Received from [facility], [facility]   Social Network    Social Network: Not on file  Intimate Partner Violence: Unknown (03/17/2022)   [facility] from [facility], [facility]   HITS    Physically Hurt: Not on file    Insult or Talk Down To: Not on file    Threaten Physical Harm: Not on file    Scream or Curse: Not on file   Family History  Problem Relation Age of Onset   Hypertension Mother       VITAL SIGNS BP 120/80   Pulse 75   Temp 98.4 F (36.9 C)   Resp 18   Ht 5\' 8"  (1.727 m)   Wt 165 lb 9.6 oz (75.1 kg)   SpO2 95%   BMI 25.18 kg/m   Outpatient Encounter Medications as of 12/29/2023  Medication Sig   acetaminophen (TYLENOL) 325 MG tablet Take 325 mg by mouth in the morning and at bedtime.   amLODipine (NORVASC) 2.5 MG tablet Take 2.5 mg by mouth daily.   atorvastatin (LIPITOR) 40 MG tablet Take 40 mg by mouth daily.   baclofen (LIORESAL) 10 MG tablet Take 10 mg by mouth 3 (three) times daily.   Balsam [place]-Castor Oil (VENELEX) OINT Apply topically.   BD AUTOSHIELD  DUO 30G X 5 MM MISC 3/16"   cholecalciferol (VITAMIN D3) 25 MCG (1000 UNIT) tablet Take 1,000 Units by mouth daily.   clotrimazole (LOTRIMIN) 1 % cream Apply 1 Application topically 2 (two) times daily.   Continuous Glucose Receiver (FREESTYLE LIBRE 3 READER) DEVI 1 Device by Does not apply route daily.   Continuous Glucose Sensor (FREESTYLE LIBRE 3 SENSOR) MISC 1 Device by Does not apply route every 14 (fourteen) days. Place 1  sensor on the skin every 14 days. Use to check glucose continuously   FLUoxetine (PROZAC) 20 MG tablet Take 20 mg by mouth daily.   insulin glargine (LANTUS) 100 UNIT/ML injection Inject 16 Units into the skin daily.   insulin lispro (HUMALOG) 100 UNIT/ML cartridge Inject 5 Units into the skin in the morning and at bedtime. Give 5 units  with breakfast and lunch give if he eats more than 50% of meals   loratadine (CLARITIN) 10 MG tablet Take 10 mg by mouth daily.   losartan (COZAAR) 25 MG tablet Take 25 mg by mouth daily. DX: Hypertensive heart and chronic kidney disease without heart failure, with stage 1 through stage 4 chronic kidney disease, or unspecified chronic kidney disease-per NP note, code updated   melatonin 5 MG TABS Take 5 mg by mouth at bedtime.   NON FORMULARY Diet: Regular; please put food in bowls for all meals   Nutritional Supplements (ENSURE ENLIVE PO) Take by mouth daily.   rOPINIRole (REQUIP) 0.25 MG tablet Take 0.25 mg by mouth at bedtime.   sennosides-docusate sodium (SENOKOT-S) 8.6-50 MG tablet Take 1 tablet by mouth in the morning and at bedtime.   No facility-administered encounter medications on file as of 12/29/2023.     SIGNIFICANT DIAGNOSTIC EXAMS  PREVIOUS   07-27-22: dexa: t score -1.421  NO NEW EXAMS   LABS REVIEWED PREVIOUS:     03-30-23: wbc 8.1; hgb 15.8; hct 48.4 mcv 93.1 plt 182; glucose 123; bun 34; creat 1.63; k+ 3.9; na++ 138; ca 8.9; gfr 43; protein 6.2 albumin 3.0; hgb A1c 6.9; chol 98; ldl 59; trig 77; ldl 24; tsh 1.280   07-20-23: hgb A1c  6.8 10-16-23: wbc 8.8; hgb 15.9; hct 49.9; mcv 93.8 plt 176; glucose 101; bun 32; creat 1.70; k+ 3.8; na++ 137; ca 9.1 gfr 41; protein 6.5 albumin 3.1 hgb A1c 6.8; chol 106; ldl 65; trig 72; hdl 27; tsh 2.208    NO NEW LABS.   Review of Systems  Reason unable to perform ROS: aphasia.   Physical Exam Constitutional:      General: He is not in acute distress.    Appearance: He is well-developed. He is not diaphoretic.  Neck:     Thyroid: No thyromegaly.  Cardiovascular:     Rate and Rhythm: Normal rate and regular rhythm.     Pulses: Normal pulses.     Heart sounds: Normal heart sounds.  Pulmonary:     Effort: Pulmonary effort is normal. No respiratory distress.     Breath sounds: Normal breath sounds.  Abdominal:     General: Bowel sounds are normal. There is no distension.     Palpations: Abdomen is soft.     Tenderness: There is no abdominal tenderness.  Musculoskeletal:     Cervical back: Neck supple.     Right lower leg: No edema.     Left lower leg: No edema.     Comments:  Right hemiplegia    Lymphadenopathy:     Cervical: No cervical adenopathy.  Skin:    General: Skin is warm and dry.  Neurological:     Mental Status: He is alert. Mental status is at baseline.  Psychiatric:        Mood and Affect: Mood normal.      ASSESSMENT/ PLAN:  TODAY  Aortic atherosclerosis Right spastic hemiplegia Atrial fibrillation with normal ventricular response  Will increase tylenol to 650 mg three times daily  Will increase baclofen 5 mg  three times daily Will put magic cup on trays Will monitor his status.   Time spent with patient: 40 minutes: medications; pain management; dietary.    Synthia Innocent NP [facility]   call [phone]

## 2024-01-01 ENCOUNTER — Other Ambulatory Visit (HOSPITAL_COMMUNITY)
Admission: RE | Admit: 2024-01-01 | Discharge: 2024-01-01 | Disposition: A | Discharge status: Home / Self Care | Payer: Medicare PPO | Source: Skilled Nursing Facility | Attending: Adult Health | Admitting: Adult Health

## 2024-01-01 DIAGNOSIS — E1169 Type 2 diabetes mellitus with other specified complication: Secondary | ICD-10-CM | POA: Insufficient documentation

## 2024-01-02 LAB — MICROALBUMIN / CREATININE URINE RATIO
Creatinine, Urine: 112.8 mg/dL
Microalb Creat Ratio: 256 mg/g{creat} — ABNORMAL HIGH (ref 0–29)
Microalb, Ur: 288.4 ug/mL — ABNORMAL HIGH

## 2024-01-10 ENCOUNTER — Encounter: Payer: Self-pay | Admitting: Adult Health

## 2024-01-10 ENCOUNTER — Non-Acute Institutional Stay (SKILLED_NURSING_FACILITY): Payer: Medicare PPO | Admitting: Adult Health

## 2024-01-10 DIAGNOSIS — Z0000 Encounter for general adult medical examination without abnormal findings: Secondary | ICD-10-CM | POA: Diagnosis not present

## 2024-01-10 NOTE — Patient Instructions (Signed)
   [name] , Thank you for taking time to come for your Medicare Wellness Visit. I appreciate your ongoing commitment to your health goals. Please review the following plan we discussed and let me know if I can assist you in the future.   These are the goals we discussed:  Goals      Absence of Fall and Fall-Related Injury     Evidence-based guidance:  Assess fall risk using a validated tool when available. Consider balance and gait impairment, muscle weakness, diminished vision or hearing, environmental hazards, presence of urinary or bowel urgency and/or incontinence.  Communicate fall injury risk to interprofessional healthcare team.  Develop a fall prevention plan with the patient and family.  Promote use of personal vision and auditory aids.  Promote reorientation, appropriate sensory stimulation, and routines to decrease risk of fall when changes in mental status are present.  Assess assistance level required for safe and effective self-care; consider referral for home care.  Encourage physical activity, such as performance of self-care at highest level of ability, strength and balance exercise program, and provision of appropriate assistive devices; refer to rehabilitation therapy.  Refer to community-based fall prevention program where available.  If fall occurs, determine the cause and revise fall injury prevention plan.  Regularly review medication contribution to fall risk; consider risk related to polypharmacy and age.  Refer to pharmacist for consultation when concerns about medications are revealed.  Balance adequate pain management with potential for oversedation.  Provide guidance related to environmental modifications.  Consider supplementation with Vitamin D.   Notes:      Follow up with Provider as scheduled     General - Client will not be readmitted within 30 days ([facility])        This is a list of the screening recommended for you and due dates:  Health  Maintenance  Topic Date Due   Complete foot exam   04/02/2024   Hemoglobin A1C  04/14/2024   Eye exam for diabetics  09/26/2024   Yearly kidney function blood test for diabetes  10/15/2024   Yearly kidney health urinalysis for diabetes  12/31/2024   Medicare Annual Wellness Visit  01/08/2025   DTaP/Tdap/Td vaccine (3 - Td or Tdap) 09/02/2029   Pneumonia Vaccine  Completed   Flu Shot  Completed   Hepatitis C Screening  Completed   Zoster (Shingles) Vaccine  Completed   HPV Vaccine  Aged Out   COVID-19 Vaccine  Discontinued

## 2024-01-10 NOTE — Progress Notes (Signed)
Subjective:   [name] is a 78 y.o. male who presents for Medicare Annual/Subsequent preventive examination.  Visit Complete: In person  Patient Medicare AWV questionnaire was completed by the patient on [id number]; I have confirmed that all information answered by patient is correct and no changes since this date.  Cardiac Risk Factors include: advanced age (>55men, >68 women);diabetes mellitus;male gender;hypertension;sedentary lifestyle     Objective:    Today's Vitals   01/10/24 1135  BP: 117/63  Pulse: 65  Resp: 18  Temp: 98.5 F (36.9 C)  SpO2: 97%  Weight: 165 lb 9.6 oz (75.1 kg)  Height: 5\' 8"  (1.727 m)   Body mass index is 25.18 kg/m.     09/29/2023    9:02 AM 07/19/2023   10:49 AM 05/31/2023   10:58 AM 05/02/2023    8:58 AM 03/15/2023    3:24 PM 02/21/2023    3:21 PM 01/19/2023    9:07 AM  Advanced Directives  Does Patient Have a Medical Advance Directive? Yes Yes Yes Yes Yes Yes Yes  Type of Advance Directive Out of facility DNR (pink MOST or yellow form) Out of facility DNR (pink MOST or yellow form) Out of facility DNR (pink MOST or yellow form) Out of facility DNR (pink MOST or yellow form) Out of facility DNR (pink MOST or yellow form) Out of facility DNR (pink MOST or yellow form) Out of facility DNR (pink MOST or yellow form)  Does patient want to make changes to medical advance directive? No - Patient declined No - Patient declined No - Patient declined No - Patient declined No - Patient declined No - Patient declined No - Patient declined  Pre-existing out of facility DNR order (yellow form or pink MOST form) Pink MOST form placed in chart (order not valid for inpatient use);Yellow form placed in chart (order not valid for inpatient use) Pink MOST form placed in chart (order not valid for inpatient use);Yellow form placed in chart (order not valid for inpatient use) Pink MOST form placed in chart (order not valid for inpatient use);Yellow form placed in chart  (order not valid for inpatient use) Pink MOST form placed in chart (order not valid for inpatient use);Yellow form placed in chart (order not valid for inpatient use) Pink MOST form placed in chart (order not valid for inpatient use);Yellow form placed in chart (order not valid for inpatient use)  Pink MOST form placed in chart (order not valid for inpatient use);Yellow form placed in chart (order not valid for inpatient use)    Current Medications (verified) Outpatient Encounter Medications as of 01/10/2024  Medication Sig   acetaminophen (TYLENOL) 325 MG tablet Take 650 mg by mouth 3 (three) times daily as needed for mild pain (pain score 1-3).   amLODipine (NORVASC) 2.5 MG tablet Take 2.5 mg by mouth daily.   atorvastatin (LIPITOR) 40 MG tablet Take 40 mg by mouth daily.   baclofen (LIORESAL) 10 MG tablet Take 5 mg by mouth 3 (three) times daily.   Balsam [place]-Castor Oil (VENELEX) OINT Apply topically.   BD AUTOSHIELD DUO 30G X 5 MM MISC 3/16"   cholecalciferol (VITAMIN D3) 25 MCG (1000 UNIT) tablet Take 1,000 Units by mouth daily.   clotrimazole (LOTRIMIN) 1 % cream Apply 1 Application topically 2 (two) times daily.   Continuous Glucose Receiver (FREESTYLE LIBRE 3 READER) DEVI 1 Device by Does not apply route daily.   Continuous Glucose Sensor (FREESTYLE LIBRE 3 SENSOR) MISC 1 Device by Does  not apply route every 14 (fourteen) days. Place 1 sensor on the skin every 14 days. Use to check glucose continuously   FLUoxetine (PROZAC) 20 MG tablet Take 20 mg by mouth daily.   insulin glargine (LANTUS) 100 UNIT/ML injection Inject 16 Units into the skin daily.   insulin lispro (HUMALOG) 100 UNIT/ML cartridge Inject 5 Units into the skin in the morning and at bedtime. Give 5 units  with breakfast and lunch give if he eats more than 50% of meals   loratadine (CLARITIN) 10 MG tablet Take 10 mg by mouth daily.   losartan (COZAAR) 25 MG tablet Take 25 mg by mouth daily. DX: Hypertensive heart and chronic  kidney disease without heart failure, with stage 1 through stage 4 chronic kidney disease, or unspecified chronic kidney disease-per NP note, code updated   melatonin 5 MG TABS Take 5 mg by mouth at bedtime.   NON FORMULARY Diet: Regular; please put food in bowls for all meals   Nutritional Supplements (ENSURE ENLIVE PO) Take by mouth daily.   rOPINIRole (REQUIP) 0.25 MG tablet Take 0.25 mg by mouth at bedtime.   sennosides-docusate sodium (SENOKOT-S) 8.6-50 MG tablet Take 1 tablet by mouth in the morning and at bedtime.   No facility-administered encounter medications on file as of 01/10/2024.    Allergies (verified) Patient has no known allergies.   History: Past Medical History:  Diagnosis Date   A-fib (HCC)    a. Post-op afib after CABG 08/2012.   Acute gastric ulcer with hemorrhage    Acute respiratory failure with hypoxia (HCC)    Aphasia following cerebral infarction    CAD (coronary artery disease)    a. NSTEMI s/p stent to distal RCA 03/2000. b. Inf MI s/p emergent thrombectomy/stenting mid RCA 10/2000. c. NSTEMI s/p CABGx4 (LIMA-LAD, SVG-OM2, seq SVG-acute marginal and PD) 08/16/12 - course complicated by confusion, post-op AF, L pleural effusion with thoracentesis. d. Normal LV function by echo 08/2012.   Diverticulosis    DM with CKD    Dysphagia following cerebral infarction    Encephalopathy    Extended spectrum beta lactamase (ESBL) resistance    Generalized anxiety disorder    GERD (gastroesophageal reflux disease)    Hemiplegia and hemiparesis following cerebral infarction affecting right dominant side (HCC)    Hiatal hernia    HLD (hyperlipidemia)    HTN (hypertension)    Non-ST elevation (NSTEMI) myocardial infarction [facility])    Nontraumatic intracerebral hemorrhage in hemisphere, subcortical [facility])    Peripheral vascular disease (HCC)    a. Carotid dopplers neg 08/13/12. b. Pre-cabg ABIs - R=0.87 suggesting mild dz, L=1.29 possibly falsely elevated due to calcified  vessels.   Pleural effusion    a. L pleural eff after CABG s/p thoracentesis 08/22/12.   Prostate cancer [facility])    Status post radiation treatment.   Prostatic hypertrophy    a. Hx of urinary retention, awaiting TURP.   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 Mild  MR by TEE 08/2012.   Past Surgical History:  Procedure Laterality Date   APPENDECTOMY     BACK SURGERY     CORONARY ARTERY BYPASS GRAFT  08/16/2012   Procedure: CORONARY ARTERY BYPASS GRAFTING (CABG);  Surgeon: Loreli Slot, MD;  Location: [facility] OR;  Service: Open Heart Surgery;  Laterality: N/A;   IR REPLC  GASTRO/COLONIC TUBE PERCUT W/FLUORO  02/13/2019   LEFT HEART CATHETERIZATION WITH CORONARY ANGIOGRAM N/A 08/14/2012   Procedure: LEFT HEART CATHETERIZATION WITH CORONARY ANGIOGRAM;  Surgeon: Peter M Swaz[place], MD;  Location: [facility] CATH LAB;  Service: Cardiovascular;  Laterality: N/A;   Family History  Problem Relation Age of Onset   Hypertension Mother    Social History   Socioeconomic History   Marital status: Married    Spouse name: Not on file   Number of children: Not on file   Years of education: Not on file   Highest education level: Not on file  Occupational History   Occupation: retired   Tobacco Use   Smoking status: Former    Current packs/day: 0.00    Types: Cigarettes    Quit date: 11/12/1972    Years since quitting: 51.1   Smokeless tobacco: Never  Vaping Use   Vaping status: Never Used  Substance and Sexual Activity   Alcohol use: No   Drug use: No   Sexual activity: Not Currently  Other Topics Concern   Not on file  Social History Narrative   He is a long term patient of [facility]    Social Drivers of Corporate investment banker Strain: Low Risk  (07/01/2019)   Overall Financial Resource Strain (CARDIA)    Difficulty of Paying Living Expenses: Not hard at all  Food  Insecurity: No Food Insecurity (02/03/2021)   Received from [facility], [facility]   Hunger Vital Sign    Worried About Running Out of Food in the Last Year: Never true    Ran Out of Food in the Last Year: Never true  Transportation Needs: Unknown (07/01/2019)   PRAPARE - Transportation    Lack of Transportation (Medical): Patient declined    Lack of Transportation (Non-Medical): Patient declined  Physical Activity: Inactive (10/30/2019)   Exercise Vital Sign    Days of Exercise per Week: 0 days    Minutes of Exercise per Session: 0 min  Stress: Unknown (07/01/2019)   [employer] of Occupational Health - Occupational Stress Questionnaire    Feeling of Stress : Patient declined  Social Connections: Unknown (04/25/2022)   Received from [employer], [facility]   Social Network    Social Network: Not on file    Tobacco Counseling Counseling given: Not Answered   Clinical Intake:  Pre-visit preparation completed: Yes  Pain : No/denies pain     BMI - recorded: 25.18 Nutritional Status: BMI 25 -29 Overweight Nutritional Risks: Unintentional weight loss Diabetes: Yes CBG done?: Yes CBG resulted in Enter/ Edit results?: Yes Did pt. bring in CBG monitor from home?: No  How often do you need to have someone help you when you read instructions, pamphlets, or other written materials from your doctor or pharmacy?: 5 - Always  Interpreter Needed?: No      Activities of Daily Living    01/10/2024   11:38 AM  In your present state of health, do you have any difficulty performing the following activities:  Hearing? 0  Vision? 0  Difficulty concentrating or making decisions? 1  Walking or climbing stairs? 1  Dressing or bathing? 1  Doing errands, shopping? 1  Preparing Food and eating ? Y  Using the Toilet? Y  In the past six months, have you accidently leaked urine? Y  Do you have problems with loss of bowel control? Y  Managing your Medications? Y  Managing  your Finances? Y  Housekeeping or managing your Housekeeping?  Y    Patient Care Team: Sharee Holster, NP as PCP - General (Geriatric Medicine) Robyne Askew, MD as Consulting Physician (Urology)  Indicate any recent Medical Services you may have received from other than [facility] providers in the past year (date may be approximate).     Assessment:   This is a routine wellness examination for [name].  Hearing/Vision screen No results found.   Goals Addressed             This Visit's Progress    Absence of Fall and Fall-Related Injury   On track    Evidence-based guidance:  Assess fall risk using a validated tool when available. Consider balance and gait impairment, muscle weakness, diminished vision or hearing, environmental hazards, presence of urinary or bowel urgency and/or incontinence.  Communicate fall injury risk to interprofessional healthcare team.  Develop a fall prevention plan with the patient and family.  Promote use of personal vision and auditory aids.  Promote reorientation, appropriate sensory stimulation, and routines to decrease risk of fall when changes in mental status are present.  Assess assistance level required for safe and effective self-care; consider referral for home care.  Encourage physical activity, such as performance of self-care at highest level of ability, strength and balance exercise program, and provision of appropriate assistive devices; refer to rehabilitation therapy.  Refer to community-based fall prevention program where available.  If fall occurs, determine the cause and revise fall injury prevention plan.  Regularly review medication contribution to fall risk; consider risk related to polypharmacy and age.  Refer to pharmacist for consultation when concerns about medications are revealed.  Balance adequate pain management with potential for oversedation.  Provide guidance related to environmental modifications.  Consider  supplementation with Vitamin D.   Notes:      Follow up with Provider as scheduled   On track    General - Client will not be readmitted within 30 days ([facility])   On track      Depression Screen    01/10/2024   11:40 AM 12/26/2023   12:20 PM 09/29/2023    9:02 AM 01/19/2023    9:06 AM 01/05/2023    1:57 PM 01/25/2022    1:45 PM 01/04/2022   11:43 AM  PHQ 2/9 Scores  PHQ - 2 Score   0 0     Exception Documentation Other- indicate reason in comment box Other- indicate reason in comment box   Other- indicate reason in comment box Medical reason Medical reason  Not completed unable to fully participate unable to participate   unable to participate      Fall Risk    12/26/2023   12:19 PM 09/29/2023    9:01 AM 05/02/2023   12:19 PM 01/19/2023    9:05 AM 01/05/2023    1:57 PM  Fall Risk   Falls in the past year? 0 0 0 0 0  Number falls in past yr: 0 0 0 0 0  Injury with Fall? 0 0 0 0 0  Risk for fall due to : Impaired balance/gait;Impaired mobility No Fall Risks Impaired balance/gait;Impaired mobility History of fall(s);Impaired balance/gait;Impaired mobility History of fall(s);Impaired balance/gait;Impaired mobility  Follow up  Falls evaluation completed Falls evaluation completed Falls evaluation completed     MEDICARE RISK AT HOME: Medicare Risk at Home Any stairs in or around the home?: Yes If so, are there any without handrails?: No Home free of loose throw rugs in walkways, pet beds, electrical cords, etc?: Yes Adequate lighting  in your home to reduce risk of falls?: Yes Life alert?: No Use of a cane, walker or w/c?: Yes Grab bars in the bathroom?: Yes Shower chair or bench in shower?: Yes Elevated toilet seat or a handicapped toilet?: Yes  TIMED UP AND GO:  Was the test performed?  No    Cognitive Function:    01/10/2024   11:40 AM 01/05/2023    1:59 PM 12/21/2021   10:17 AM 11/02/2020   11:11 AM  MMSE - Mini Mental State Exam  Not completed: Unable to complete Unable  to complete Unable to complete Unable to complete        11/02/2020   11:11 AM  6CIT Screen  What Year? --    Immunizations Immunization History  Administered Date(s) Administered   Fluad Quad(high Dose 65+) 09/13/2022, 10/09/2023   Influenza,inj,Quad PF,6+ Mos 09/15/2021   Influenza-Unspecified 09/16/2019, 09/18/2020   Moderna Covid-19 Vaccine Bivalent Booster 49yrs & up 10/05/2021   Moderna SARS-COV2 Booster Vaccination 03/24/2021, 05/03/2022, 10/05/2022   Moderna Sars-Covid-2 Vaccination 12/18/2019, 01/15/2020, 10/15/2020, 10/05/2022   PNEUMOCOCCAL CONJUGATE-20 06/30/2021   Pneumococcal Conjugate-13 07/31/2014   Pneumococcal-Unspecified 09/03/2019   Td 09/03/2019   Tdap 07/31/2014   Zoster Recombinant(Shingrix) 11/12/2021, 01/05/2022   Zoster, Live 12/02/2009   Zoster, Unspecified 08/11/2021    TDAP status: Up to date  Flu Vaccine status: Up to date  Pneumococcal vaccine status: Up to date  Covid-19 vaccine status: Completed vaccines  Qualifies for Shingles Vaccine? No   Zostavax completed Yes   Shingrix Completed?: No.    Education has been provided regarding the importance of this vaccine. Patient has been advised to call insurance company to determine out of pocket expense if they have not yet received this vaccine. Advised may also receive vaccine at local pharmacy or Health Dept. Verbalized acceptance and understanding.  Screening Tests Health Maintenance  Topic Date Due   FOOT EXAM  04/02/2024   HEMOGLOBIN A1C  04/14/2024   OPHTHALMOLOGY EXAM  09/26/2024   Diabetic kidney evaluation - eGFR measurement  10/15/2024   Diabetic kidney evaluation - Urine ACR  12/31/2024   Medicare Annual Wellness (AWV)  01/08/2025   DTaP/Tdap/Td (3 - Td or Tdap) 09/02/2029   Pneumonia Vaccine 66+ Years old  Completed   INFLUENZA VACCINE  Completed   Hepatitis C Screening  Completed   Zoster Vaccines- Shingrix  Completed   HPV VACCINES  Aged Out   COVID-19 Vaccine   Discontinued    Health Maintenance  There are no preventive care reminders to display for this patient.   Colorectal cancer screening: No longer required.   Lung Cancer Screening: (Low Dose CT Chest recommended if Age 57-80 years, 20 pack-year currently smoking OR have quit w/in 15years.) does not qualify.   Lung Cancer Screening Referral:   Additional Screening:  Hepatitis C Screening: does not qualify; Completed 11-01-19  Vision Screening: Recommended annual ophthalmology exams for early detection of glaucoma and other disorders of the eye. Is the patient up to date with their annual eye exam?  Yes  Who is the provider or what is the name of the office in which the patient attends annual eye exams?  If pt is not established with a provider, would they like to be referred to a provider to establish care? No .   Dental Screening: Recommended annual dental exams for proper oral hygiene  Diabetic Foot Exam: Diabetic Foot Exam: Completed    Community Resource Referral / Chronic Care Management: CRR required this visit?  No   CCM required this visit?  No     Plan:     I have personally reviewed and noted the following in the patient's chart:   Medical and social history Use of alcohol, tobacco or illicit drugs  Current medications and supplements including opioid prescriptions. Patient is not currently taking opioid prescriptions. Functional ability and status Nutritional status Physical activity Advanced directives List of other physicians Hospitalizations, surgeries, and ER visits in previous 12 months Vitals Screenings to include cognitive, depression, and falls Referrals and appointments  In addition, I have reviewed and discussed with patient certain preventive protocols, quality metrics, and best practice recommendations. A written personalized care plan for preventive services as well as general preventive health recommendations were provided to patient.      Sharee Holster, NP   01/10/2024   After Visit Summary: (In Person-Declined) Patient declined AVS at this time.  Nurse Notes: this exam was performed by myself at this facility

## 2024-01-22 DIAGNOSIS — F5105 Insomnia due to other mental disorder: Secondary | ICD-10-CM | POA: Diagnosis not present

## 2024-01-22 DIAGNOSIS — F331 Major depressive disorder, recurrent, moderate: Secondary | ICD-10-CM | POA: Diagnosis not present

## 2024-01-26 DIAGNOSIS — L602 Onychogryphosis: Secondary | ICD-10-CM | POA: Diagnosis not present

## 2024-01-26 DIAGNOSIS — Z794 Long term (current) use of insulin: Secondary | ICD-10-CM | POA: Diagnosis not present

## 2024-01-26 DIAGNOSIS — E1151 Type 2 diabetes mellitus with diabetic peripheral angiopathy without gangrene: Secondary | ICD-10-CM | POA: Diagnosis not present

## 2024-01-29 ENCOUNTER — Encounter: Payer: Self-pay | Admitting: Adult Health

## 2024-01-29 ENCOUNTER — Non-Acute Institutional Stay (SKILLED_NURSING_FACILITY): Payer: Self-pay | Admitting: Adult Health

## 2024-01-29 DIAGNOSIS — I700 Atherosclerosis of aorta: Secondary | ICD-10-CM

## 2024-01-29 DIAGNOSIS — I2510 Atherosclerotic heart disease of native coronary artery without angina pectoris: Secondary | ICD-10-CM

## 2024-01-29 DIAGNOSIS — E1159 Type 2 diabetes mellitus with other circulatory complications: Secondary | ICD-10-CM | POA: Diagnosis not present

## 2024-01-29 DIAGNOSIS — I152 Hypertension secondary to endocrine disorders: Secondary | ICD-10-CM | POA: Diagnosis not present

## 2024-01-29 NOTE — Progress Notes (Signed)
 Location:  [facility] Room Number: 130 Place of Service:  SNF (31)   CODE STATUS: dnr   No Known Allergies  Chief Complaint  Patient presents with   Medical Management of Chronic Issues          Aortic atherosclerosis  CAD native heart without angina: Hypertension associated with type 2 diabetes mellitus     HPI:  He is a 78 year old long term resident of this facility being seen for the management of his chronic illnesses: Aortic atherosclerosis  CAD native heart without angina: Hypertension associated with type 2 diabetes mellitus. There are no reports of uncontrolled pain. His weight is stable. There are no reports of chest pain or shortness of breath.   Past Medical History:  Diagnosis Date   A-fib [facility])    a. Post-op afib after CABG 08/2012.   Acute gastric ulcer with hemorrhage    Acute respiratory failure with hypoxia (HCC)    Aphasia following cerebral infarction    CAD (coronary artery disease)    a. NSTEMI s/p stent to distal RCA 03/2000. b. Inf MI s/p emergent thrombectomy/stenting mid RCA 10/2000. c. NSTEMI s/p CABGx4 (LIMA-LAD, SVG-OM2, seq SVG-acute marginal and PD) 08/16/12 - course complicated by confusion, post-op AF, L pleural effusion with thoracentesis. d. Normal LV function by echo 08/2012.   Diverticulosis    DM with CKD    Dysphagia following cerebral infarction    Encephalopathy    Extended spectrum beta lactamase (ESBL) resistance    Generalized anxiety disorder    GERD (gastroesophageal reflux disease)    Hemiplegia and hemiparesis following cerebral infarction affecting right dominant side (HCC)    Hiatal hernia    HLD (hyperlipidemia)    HTN (hypertension)    Non-ST elevation (NSTEMI) myocardial infarction [facility])    Nontraumatic intracerebral hemorrhage in hemisphere, subcortical [facility])    Peripheral vascular disease (HCC)    a. Carotid dopplers neg 08/13/12. b. Pre-cabg ABIs - R=0.87 suggesting mild dz, L=1.29 possibly falsely  elevated due to calcified vessels.   Pleural effusion    a. L pleural eff after CABG s/p thoracentesis 08/22/12.   Prostate cancer [facility])    Status post radiation treatment.   Prostatic hypertrophy    a. Hx of urinary retention, awaiting TURP.   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 Mild  MR by TEE 08/2012.    Past Surgical History:  Procedure Laterality Date   APPENDECTOMY     BACK SURGERY     CORONARY ARTERY BYPASS GRAFT  08/16/2012   Procedure: CORONARY ARTERY BYPASS GRAFTING (CABG);  Surgeon: Loreli Slot, MD;  Location: [facility] OR;  Service: Open Heart Surgery;  Laterality: N/A;   IR REPLC GASTRO/COLONIC TUBE PERCUT W/FLUORO  02/13/2019   LEFT HEART CATHETERIZATION WITH CORONARY ANGIOGRAM N/A 08/14/2012   Procedure: LEFT HEART CATHETERIZATION WITH CORONARY ANGIOGRAM;  Surgeon: Peter M Swaziland, MD;  Location: [facility] CATH LAB;  Service: Cardiovascular;  Laterality: N/A;    Social History   Socioeconomic History   Marital status: Married    Spouse name: Not on file   Number of children: Not on file   Years of education: Not on file   Highest education level: Not on file  Occupational History   Occupation: retired   Tobacco Use   Smoking status: Former  Current packs/day: 0.00    Types: Cigarettes    Quit date: 11/12/1972    Years since quitting: 51.2   Smokeless tobacco: Never  Vaping Use   Vaping status: Never Used  Substance and Sexual Activity   Alcohol use: No   Drug use: No   Sexual activity: Not Currently  Other Topics Concern   Not on file  Social History Narrative   He is a long term patient of [facility]    Social Drivers of Corporate investment banker Strain: Low Risk  (07/01/2019)   Overall Financial Resource Strain (CARDIA)    Difficulty of Paying Living Expenses: Not hard at all  Food Insecurity: No Food Insecurity (02/03/2021)    Received from [facility], [facility]   Hunger Vital Sign    Worried About Running Out of Food in the Last Year: Never true    Ran Out of Food in the Last Year: Never true  Transportation Needs: Unknown (07/01/2019)   PRAPARE - Transportation    Lack of Transportation (Medical): Patient declined    Lack of Transportation (Non-Medical): Patient declined  Physical Activity: Inactive (10/30/2019)   Exercise Vital Sign    Days of Exercise per Week: 0 days    Minutes of Exercise per Session: 0 min  Stress: Unknown (07/01/2019)   [employer] of Occupational Health - Occupational Stress Questionnaire    Feeling of Stress : Patient declined  Social Connections: Unknown (04/25/2022)   Received from [facility], [facility]   Social Network    Social Network: Not on file  Intimate Partner Violence: Unknown (03/17/2022)   Received from [facility], [facility]   HITS    Physically Hurt: Not on file    Insult or Talk Down To: Not on file    Threaten Physical Harm: Not on file    Scream or Curse: Not on file   Family History  Problem Relation Age of Onset   Hypertension Mother       VITAL SIGNS BP 129/84   Pulse 75   Temp 97.9 F (36.6 C)   Resp 18   Ht 5\' 8"  (1.727 m)   Wt 163 lb 12.8 oz (74.3 kg)   SpO2 96%   BMI 24.91 kg/m   Outpatient Encounter Medications as of 01/29/2024  Medication Sig   acetaminophen (TYLENOL) 325 MG tablet Take 650 mg by mouth 3 (three) times daily as needed for mild pain (pain score 1-3).   amLODipine (NORVASC) 2.5 MG tablet Take 2.5 mg by mouth daily.   atorvastatin (LIPITOR) 40 MG tablet Take 40 mg by mouth daily.   baclofen (LIORESAL) 10 MG tablet Take 5 mg by mouth 3 (three) times daily.   Balsam [place]-Castor Oil (VENELEX) OINT Apply topically.   BD AUTOSHIELD DUO 30G X 5 MM MISC 3/16"   cholecalciferol (VITAMIN D3) 25 MCG (1000 UNIT) tablet Take 1,000 Units by mouth daily.   clotrimazole (LOTRIMIN) 1 % cream Apply 1 Application  topically 2 (two) times daily.   Continuous Glucose Receiver (FREESTYLE LIBRE 3 READER) DEVI 1 Device by Does not apply route daily.   Continuous Glucose Sensor (FREESTYLE LIBRE 3 SENSOR) MISC 1 Device by Does not apply route every 14 (fourteen) days. Place 1 sensor on the skin every 14 days. Use to check glucose continuously   FLUoxetine (PROZAC) 20 MG tablet Take 20 mg by mouth daily.   insulin glargine (LANTUS) 100 UNIT/ML injection Inject 16 Units into the skin daily.  insulin lispro (HUMALOG) 100 UNIT/ML cartridge Inject 5 Units into the skin in the morning and at bedtime. Give 5 units  with breakfast and lunch give if he eats more than 50% of meals   loratadine (CLARITIN) 10 MG tablet Take 10 mg by mouth daily.   losartan (COZAAR) 25 MG tablet Take 25 mg by mouth daily. DX: Hypertensive heart and chronic kidney disease without heart failure, with stage 1 through stage 4 chronic kidney disease, or unspecified chronic kidney disease-per NP note, code updated   melatonin 5 MG TABS Take 5 mg by mouth at bedtime.   NON FORMULARY Diet: Regular; please put food in bowls for all meals   Nutritional Supplements (ENSURE ENLIVE PO) Take by mouth daily.   rOPINIRole (REQUIP) 0.25 MG tablet Take 0.25 mg by mouth at bedtime.   sennosides-docusate sodium (SENOKOT-S) 8.6-50 MG tablet Take 1 tablet by mouth in the morning and at bedtime.   No facility-administered encounter medications on file as of 01/29/2024.     SIGNIFICANT DIAGNOSTIC EXAMS  PREVIOUS   07-27-22: dexa: t score -1.421  NO NEW EXAMS   LABS REVIEWED PREVIOUS:     03-30-23: wbc 8.1; hgb 15.8; hct 48.4 mcv 93.1 plt 182; glucose 123; bun 34; creat 1.63; k+ 3.9; na++ 138; ca 8.9; gfr 43; protein 6.2 albumin 3.0; hgb A1c 6.9; chol 98; ldl 59; trig 77; ldl 24; tsh 1.280  07-20-23: hgb A1c  6.8 10-16-23: wbc 8.8; hgb 15.9; hct 49.9; mcv 93.8 plt 176; glucose 101; bun 32; creat 1.70; k+ 3.8; na++ 137; ca 9.1 gfr 41; protein 6.5 albumin 3.1 hgb  A1c 6.8; chol 106; ldl 65; trig 72; hdl 27; tsh 2.208    TODAY  01-01-24: ACR 256  Review of Systems  Reason unable to perform ROS: aphasia.   Physical Exam Constitutional:      General: He is not in acute distress.    Appearance: He is well-developed. He is not diaphoretic.  Neck:     Thyroid: No thyromegaly.  Cardiovascular:     Rate and Rhythm: Normal rate and regular rhythm.     Heart sounds: Normal heart sounds.  Pulmonary:     Effort: Pulmonary effort is normal. No respiratory distress.     Breath sounds: Normal breath sounds.  Abdominal:     General: Bowel sounds are normal. There is no distension.     Palpations: Abdomen is soft.     Tenderness: There is no abdominal tenderness.  Musculoskeletal:     Cervical back: Neck supple.     Right lower leg: No edema.     Left lower leg: No edema.     Comments: Right hemiplegia    Lymphadenopathy:     Cervical: No cervical adenopathy.  Skin:    General: Skin is warm and dry.  Neurological:     Mental Status: He is alert. Mental status is at baseline.  Psychiatric:        Mood and Affect: Mood normal.      ASSESSMENT/ PLAN:  TODAY:   Aortic atherosclerosis (ct 01-04-19) is on statin  2. CAD native heart without angina: s/p cabg 2012 on cozaar 25 mg daily   3. Hypertension associated with type 2 diabetes mellitus: b/p 129/84:will continue cozaar 25 mg daily and norvasc 5 mg daily   PREVIOUS     4. History of DVT: has completed eliquis therapy  5. Osteopenia: t score -1.421 is on supplements  6. Dyslipidemia associated with type 2  diabetes mellitus: ldl 65 will continue lipitor 40 mg daily   7. Micro-albuminuria due to type 2 diabetes mellitus: 382.3 is on ARB  8. Major depression with psychotic features: is taking prozac 20 mg daily   9. Chronic constipation: is on senna s twice daily   10. Chronic urine retention/bladder spasms: foley is out; not on medications  11. GERD without esophagitis: prevacid has  been stopped  12. Chronic non-seasonal allergic rhinitis: will continue claritin 10 mg daily   13. CKD stage 3 due to type 2 diabetes mellitus: bun 32; creat 1.70; gfr 41  14. Type 2 diabetes mellitus with peripheral vascular disease: hgb A1c 6.8: will continue humalog 5 units with meals twice daily lantus 20 units nightly is on statin and arb  15. Protein calorie malnutrition, severe; albumin 3.1 will continue supplements as directed.    16. History of nontraumatic subcortical hemorrhage left cerebral hemisphere/right spastic hemiplegia  (05-22-21) is on baclofen 5 mg three times daily for spasticity   17. Restless leg syndrome; is not on medications at this time  18. Chronic generalized pain: will continue tylenol 650 mg twice daily    Synthia Innocent NP [facility]  call [phone]

## 2024-01-29 NOTE — Progress Notes (Signed)
History of Present Illness: Here today for follow-up.  This patient was last seen 6 months ago.  He has a history of prostate cancer and has received radiation.  Last PSA checked 2-1/2 years ago was 0.37, stable.  He has a history of urinary retention and was catheter dependent for some time.  He has been without a catheter more recently, has constant leakage, but carries a low residual urine volume.  At his visit 6 months ago he was noted to have phimosis and balanitis.  He was treated with topical antifungal agents.  2.18.2025: Here today for scheduled check.  No real urologic issues noted over the past 6 months.  He has not been treated for urinary tract infection.  No complaints of penile pain.    Past Medical History:  Diagnosis Date   A-fib [facility])    a. Post-op afib after CABG 08/2012.   Acute gastric ulcer with hemorrhage    Acute respiratory failure with hypoxia (HCC)    Aphasia following cerebral infarction    CAD (coronary artery disease)    a. NSTEMI s/p stent to distal RCA 03/2000. b. Inf MI s/p emergent thrombectomy/stenting mid RCA 10/2000. c. NSTEMI s/p CABGx4 (LIMA-LAD, SVG-OM2, seq SVG-acute marginal and PD) 08/16/12 - course complicated by confusion, post-op AF, L pleural effusion with thoracentesis. d. Normal LV function by echo 08/2012.   Diverticulosis    DM with CKD    Dysphagia following cerebral infarction    Encephalopathy    Extended spectrum beta lactamase (ESBL) resistance    Generalized anxiety disorder    GERD (gastroesophageal reflux disease)    Hemiplegia and hemiparesis following cerebral infarction affecting right dominant side (HCC)    Hiatal hernia    HLD (hyperlipidemia)    HTN (hypertension)    Non-ST elevation (NSTEMI) myocardial infarction [facility])    Nontraumatic intracerebral hemorrhage in hemisphere, subcortical [facility])    Peripheral vascular disease (HCC)    a. Carotid dopplers neg 08/13/12. b. Pre-cabg ABIs - R=0.87 suggesting mild dz, L=1.29  possibly falsely elevated due to calcified vessels.   Pleural effusion    a. L pleural eff after CABG s/p thoracentesis 08/22/12.   Prostate cancer [facility])    Status post radiation treatment.   Prostatic hypertrophy    a. Hx of urinary retention, awaiting TURP.   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 Mild  MR by TEE 08/2012.    Past Surgical History:  Procedure Laterality Date   APPENDECTOMY     BACK SURGERY     CORONARY ARTERY BYPASS GRAFT  08/16/2012   Procedure: CORONARY ARTERY BYPASS GRAFTING (CABG);  Surgeon: Loreli Slot, MD;  Location: [facility] OR;  Service: Open Heart Surgery;  Laterality: N/A;   IR REPLC GASTRO/COLONIC TUBE PERCUT W/FLUORO  02/13/2019   LEFT HEART CATHETERIZATION WITH CORONARY ANGIOGRAM N/A 08/14/2012   Procedure: LEFT HEART CATHETERIZATION WITH CORONARY ANGIOGRAM;  Surgeon: Peter M Swaziland, MD;  Location: [facility] CATH LAB;  Service: Cardiovascular;  Laterality: N/A;    Home Medications:  Allergies as of 01/30/2024   No Known Allergies      Medication List        Accurate as of January 29, 2024  6:52 PM. If you have any questions, ask your nurse or doctor.          acetaminophen 325 MG  tablet Commonly known as: TYLENOL Take 650 mg by mouth 3 (three) times daily as needed for mild pain (pain score 1-3).   amLODipine 2.5 MG tablet Commonly known as: NORVASC Take 2.5 mg by mouth daily.   atorvastatin 40 MG tablet Commonly known as: LIPITOR Take 40 mg by mouth daily.   baclofen 10 MG tablet Commonly known as: LIORESAL Take 5 mg by mouth 3 (three) times daily.   BD AutoShield Duo 30G X 5 MM Misc Generic drug: Insulin Pen Needle 3/16"   cholecalciferol 25 MCG (1000 UNIT) tablet Commonly known as: VITAMIN D3 Take 1,000 Units by mouth daily.   clotrimazole 1 % cream Commonly known as: LOTRIMIN Apply 1 Application  topically 2 (two) times daily.   ENSURE ENLIVE PO Take by mouth daily.   FLUoxetine 20 MG tablet Commonly known as: PROZAC Take 20 mg by mouth daily.   FreeStyle Libre 3 Reader Devi 1 Device by Does not apply route daily.   FreeStyle Libre 3 Sensor Misc 1 Device by Does not apply route every 14 (fourteen) days. Place 1 sensor on the skin every 14 days. Use to check glucose continuously   HumaLOG 100 UNIT/ML cartridge Generic drug: insulin lispro Inject 5 Units into the skin in the morning and at bedtime. Give 5 units  with breakfast and lunch give if he eats more than 50% of meals   insulin glargine 100 UNIT/ML injection Commonly known as: LANTUS Inject 16 Units into the skin daily.   loratadine 10 MG tablet Commonly known as: CLARITIN Take 10 mg by mouth daily.   losartan 25 MG tablet Commonly known as: COZAAR Take 25 mg by mouth daily. DX: Hypertensive heart and chronic kidney disease without heart failure, with stage 1 through stage 4 chronic kidney disease, or unspecified chronic kidney disease-per NP note, code updated   melatonin 5 MG Tabs Take 5 mg by mouth at bedtime.   NON FORMULARY Diet: Regular; please put food in bowls for all meals   rOPINIRole 0.25 MG tablet Commonly known as: REQUIP Take 0.25 mg by mouth at bedtime.   sennosides-docusate sodium 8.6-50 MG tablet Commonly known as: SENOKOT-S Take 1 tablet by mouth in the morning and at bedtime.   Venelex Oint Apply topically.        Allergies: No Known Allergies  Family History  Problem Relation Age of Onset   Hypertension Mother     Social History:  reports that he quit smoking about 51 years ago. His smoking use included cigarettes. He has never used smokeless tobacco. He reports that he does not drink alcohol and does not use drugs.  ROS: A complete review of systems was performed.  All systems are negative except for pertinent findings as noted.  Physical Exam:  Vital signs in last 24  hours: There were no vitals taken for this visit. Cardiovascular: Regular rate  Genitourinary: Phallus is uncircumcised.  Phimosis present.  No balanitis noted. Neurologic: Grossly intact, no focal deficits Psychiatric: Normal mood and affect  I have reviewed prior pt notes  I have reviewed prior bladder scan  I have reviewed prior PSA results  I have reviewed prior urine culture  Bladder scan volume today 3 mL   Impression/Assessment:  1.  Remote history of prostate cancer, status post external beam radiotherapy.  Last PSA 2 years ago, 0.37, stable  2.  Urinary incontinence secondary to neurologic process.  He is emptying well  3.  Phimosis/balanitis-currently not an issue  Plan:  1.  No specific management of phimosis at present  2.  As he is emptying well, and has had no recent urologic issues, I will have him come back on an as-needed basis.

## 2024-01-30 ENCOUNTER — Ambulatory Visit (INDEPENDENT_AMBULATORY_CARE_PROVIDER_SITE_OTHER): Payer: Medicare PPO | Admitting: Urology

## 2024-01-30 ENCOUNTER — Encounter: Payer: Self-pay | Admitting: Urology

## 2024-01-30 VITALS — BP 118/78 | HR 60

## 2024-01-30 DIAGNOSIS — N39498 Other specified urinary incontinence: Secondary | ICD-10-CM

## 2024-01-30 DIAGNOSIS — Z8546 Personal history of malignant neoplasm of prostate: Secondary | ICD-10-CM

## 2024-02-18 ENCOUNTER — Encounter (HOSPITAL_COMMUNITY)
Admission: RE | Admit: 2024-02-18 | Discharge: 2024-02-18 | Disposition: A | Discharge status: Home / Self Care | Source: Skilled Nursing Facility | Attending: Adult Health | Admitting: Adult Health

## 2024-02-18 DIAGNOSIS — J09X1 Influenza due to identified novel influenza A virus with pneumonia: Secondary | ICD-10-CM | POA: Diagnosis not present

## 2024-02-18 LAB — RESP PANEL BY RT-PCR (RSV, FLU A&B, COVID)  RVPGX2
Influenza A by PCR: POSITIVE — AB
Influenza B by PCR: NEGATIVE
Resp Syncytial Virus by PCR: NEGATIVE
SARS Coronavirus 2 by RT PCR: NEGATIVE

## 2024-02-19 DIAGNOSIS — J111 Influenza due to unidentified influenza virus with other respiratory manifestations: Secondary | ICD-10-CM | POA: Diagnosis not present

## 2024-02-19 DIAGNOSIS — J9811 Atelectasis: Secondary | ICD-10-CM | POA: Diagnosis not present

## 2024-02-19 DIAGNOSIS — Q791 Other congenital malformations of diaphragm: Secondary | ICD-10-CM | POA: Diagnosis not present

## 2024-02-26 ENCOUNTER — Encounter: Payer: Self-pay | Admitting: Adult Health

## 2024-02-26 ENCOUNTER — Non-Acute Institutional Stay (SKILLED_NURSING_FACILITY): Payer: Self-pay | Admitting: Adult Health

## 2024-02-26 DIAGNOSIS — E785 Hyperlipidemia, unspecified: Secondary | ICD-10-CM

## 2024-02-26 DIAGNOSIS — R400 Somnolence: Secondary | ICD-10-CM | POA: Insufficient documentation

## 2024-02-26 DIAGNOSIS — E1169 Type 2 diabetes mellitus with other specified complication: Secondary | ICD-10-CM | POA: Diagnosis not present

## 2024-02-26 DIAGNOSIS — I4891 Unspecified atrial fibrillation: Secondary | ICD-10-CM

## 2024-02-26 DIAGNOSIS — I6930 Unspecified sequelae of cerebral infarction: Secondary | ICD-10-CM | POA: Diagnosis not present

## 2024-02-26 NOTE — Progress Notes (Signed)
 Location:  [facility] Room Number: 130 Place of Service:  SNF (31)   CODE STATUS: dnr   No Known Allergies  Chief Complaint  Patient presents with   Acute Visit    Change in status     HPI:  His po intake has been poor for the past 4-5 days. He is not eating or drinking enough to support life. He is somnolent; will open eyes. He is letting liquids run out of his mouth. His wife and and I have had a prolonged discussion about his overall status and prognosis. The thought is that he would more than likely pull out a feeding tube. This treatment option will not improve the quality of his life. His wife would like to focus on keeping him comfortable.    Past Medical History:  Diagnosis Date   A-fib [facility])    a. Post-op afib after CABG 08/2012.   Acute gastric ulcer with hemorrhage    Acute respiratory failure with hypoxia (HCC)    Aphasia following cerebral infarction    CAD (coronary artery disease)    a. NSTEMI s/p stent to distal RCA 03/2000. b. Inf MI s/p emergent thrombectomy/stenting mid RCA 10/2000. c. NSTEMI s/p CABGx4 (LIMA-LAD, SVG-OM2, seq SVG-acute marginal and PD) 08/16/12 - course complicated by confusion, post-op AF, L pleural effusion with thoracentesis. d. Normal LV function by echo 08/2012.   Diverticulosis    DM with CKD    Dysphagia following cerebral infarction    Encephalopathy    Extended spectrum beta lactamase (ESBL) resistance    Generalized anxiety disorder    GERD (gastroesophageal reflux disease)    Hemiplegia and hemiparesis following cerebral infarction affecting right dominant side (HCC)    Hiatal hernia    HLD (hyperlipidemia)    HTN (hypertension)    Non-ST elevation (NSTEMI) myocardial infarction [facility])    Nontraumatic intracerebral hemorrhage in hemisphere, subcortical [facility])    Peripheral vascular disease (HCC)    a. Carotid dopplers neg 08/13/12. b. Pre-cabg ABIs - R=0.87 suggesting mild dz, L=1.29 possibly falsely elevated  due to calcified vessels.   Pleural effusion    a. L pleural eff after CABG s/p thoracentesis 08/22/12.   Prostate cancer [facility])    Status post radiation treatment.   Prostatic hypertrophy    a. Hx of urinary retention, awaiting TURP.   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 Mild  MR by TEE 08/2012.    Past Surgical History:  Procedure Laterality Date   APPENDECTOMY     BACK SURGERY     CORONARY ARTERY BYPASS GRAFT  08/16/2012   Procedure: CORONARY ARTERY BYPASS GRAFTING (CABG);  Surgeon: Loreli Slot, MD;  Location: [facility] OR;  Service: Open Heart Surgery;  Laterality: N/A;   IR REPLC GASTRO/COLONIC TUBE PERCUT W/FLUORO  02/13/2019   LEFT HEART CATHETERIZATION WITH CORONARY ANGIOGRAM N/A 08/14/2012   Procedure: LEFT HEART CATHETERIZATION WITH CORONARY ANGIOGRAM;  Surgeon: Peter M Swaziland, MD;  Location: [facility] CATH LAB;  Service: Cardiovascular;  Laterality: N/A;    Social History   Socioeconomic History   Marital status: Married    Spouse name: Not on file   Number of children: Not on file   Years of education: Not on file   Highest education level: Not on file  Occupational History   Occupation: retired  Tobacco Use   Smoking status: Former    Current packs/day: 0.00    Types: Cigarettes    Quit date: 11/12/1972    Years since quitting: 51.3   Smokeless tobacco: Never  Vaping Use   Vaping status: Never Used  Substance and Sexual Activity   Alcohol use: No   Drug use: No   Sexual activity: Not Currently  Other Topics Concern   Not on file  Social History Narrative   He is a long term patient of [facility]    Social Drivers of Corporate investment banker Strain: Low Risk  (07/01/2019)   Overall Financial Resource Strain (CARDIA)    Difficulty of Paying Living Expenses: Not hard at all  Food Insecurity: No Food Insecurity (02/03/2021)   Received from  [facility], [facility]   Hunger Vital Sign    Worried About Running Out of Food in the Last Year: Never true    Ran Out of Food in the Last Year: Never true  Transportation Needs: Unknown (07/01/2019)   PRAPARE - Transportation    Lack of Transportation (Medical): Patient declined    Lack of Transportation (Non-Medical): Patient declined  Physical Activity: Inactive (10/30/2019)   Exercise Vital Sign    Days of Exercise per Week: 0 days    Minutes of Exercise per Session: 0 min  Stress: Unknown (07/01/2019)   [employer] of Occupational Health - Occupational Stress Questionnaire    Feeling of Stress : Patient declined  Social Connections: Unknown (04/25/2022)   Received from [facility], [facility]   Social Network    Social Network: Not on file  Intimate Partner Violence: Unknown (03/17/2022)   Received from [employer], [facility]   HITS    Physically Hurt: Not on file    Insult or Talk Down To: Not on file    Threaten Physical Harm: Not on file    Scream or Curse: Not on file   Family History  Problem Relation Age of Onset   Hypertension Mother       VITAL SIGNS BP 137/79   Pulse 90   Temp 98.1 F (36.7 C)   Resp 20   Ht 5' 8.5" (1.74 m)   Wt 165 lb 14.4 oz (75.3 kg)   SpO2 96%   BMI 24.86 kg/m   Outpatient Encounter Medications as of 02/26/2024  Medication Sig   acetaminophen (TYLENOL) 325 MG tablet Take 650 mg by mouth 3 (three) times daily as needed for mild pain (pain score 1-3).   amLODipine (NORVASC) 2.5 MG tablet Take 2.5 mg by mouth daily.   atorvastatin (LIPITOR) 40 MG tablet Take 40 mg by mouth daily.   baclofen (LIORESAL) 10 MG tablet Take 5 mg by mouth 3 (three) times daily.   Balsam [place]-Castor Oil (VENELEX) OINT Apply topically.   BD AUTOSHIELD DUO 30G X 5 MM MISC 3/16"   cholecalciferol (VITAMIN D3) 25 MCG (1000 UNIT) tablet Take 1,000 Units by mouth daily.   clotrimazole (LOTRIMIN) 1 % cream Apply 1 Application topically 2  (two) times daily.   Continuous Glucose Receiver (FREESTYLE LIBRE 3 READER) DEVI 1 Device by Does not apply route daily.   Continuous Glucose Sensor (FREESTYLE LIBRE 3 SENSOR) MISC 1 Device by Does not apply route every 14 (fourteen) days. Place 1 sensor on the skin every 14 days. Use to check glucose continuously   FLUoxetine (PROZAC) 20 MG tablet Take 20 mg by mouth daily.   insulin glargine (LANTUS) 100 UNIT/ML  injection Inject 16 Units into the skin daily.   insulin lispro (HUMALOG) 100 UNIT/ML cartridge Inject 5 Units into the skin in the morning and at bedtime. Give 5 units  with breakfast and lunch give if he eats more than 50% of meals   loratadine (CLARITIN) 10 MG tablet Take 10 mg by mouth daily.   losartan (COZAAR) 25 MG tablet Take 25 mg by mouth daily. DX: Hypertensive heart and chronic kidney disease without heart failure, with stage 1 through stage 4 chronic kidney disease, or unspecified chronic kidney disease-per NP note, code updated   melatonin 5 MG TABS Take 5 mg by mouth at bedtime.   NON FORMULARY Diet: Regular; please put food in bowls for all meals   Nutritional Supplements (ENSURE ENLIVE PO) Take by mouth daily.   rOPINIRole (REQUIP) 0.25 MG tablet Take 0.25 mg by mouth at bedtime.   sennosides-docusate sodium (SENOKOT-S) 8.6-50 MG tablet Take 1 tablet by mouth in the morning and at bedtime.   No facility-administered encounter medications on file as of 02/26/2024.     SIGNIFICANT DIAGNOSTIC EXAMS  LABS REVIEWED PREVIOUS:     03-30-23: wbc 8.1; hgb 15.8; hct 48.4 mcv 93.1 plt 182; glucose 123; bun 34; creat 1.63; k+ 3.9; na++ 138; ca 8.9; gfr 43; protein 6.2 albumin 3.0; hgb A1c 6.9; chol 98; ldl 59; trig 77; ldl 24; tsh 1.280  07-20-23: hgb A1c  6.8 10-16-23: wbc 8.8; hgb 15.9; hct 49.9; mcv 93.8 plt 176; glucose 101; bun 32; creat 1.70; k+ 3.8; na++ 137; ca 9.1 gfr 41; protein 6.5 albumin 3.1 hgb A1c 6.8; chol 106; ldl 65; trig 72; hdl 27; tsh 2.208     TODAY  01-01-24: ACR 256  Review of Systems  Reason unable to perform ROS: somunlent will open eyes.   Physical Exam Constitutional:      General: He is not in acute distress.    Appearance: He is well-developed. He is not diaphoretic.  Neck:     Thyroid: No thyromegaly.  Cardiovascular:     Rate and Rhythm: Normal rate. Rhythm irregular.     Heart sounds: Normal heart sounds.  Pulmonary:     Effort: Pulmonary effort is normal. No respiratory distress.     Breath sounds: Normal breath sounds.  Abdominal:     General: Bowel sounds are normal. There is no distension.     Palpations: Abdomen is soft.     Tenderness: There is no abdominal tenderness.  Musculoskeletal:     Cervical back: Neck supple.     Right lower leg: No edema.     Left lower leg: No edema.     Comments: Right hemiplegia    Lymphadenopathy:     Cervical: No cervical adenopathy.  Skin:    General: Skin is warm and dry.      ASSESSMENT/ PLAN:  TODAY  History of hemorrhagic cerebrovascular accident (CVA) with  residual deficiency Dyslipidemia associated with type 2 diabetes mellitus Atrial fibrillation with normal ventricular rate Somnolence  Will stop lipitor; vitamin D; melatonin to help reduce pill burden   His wife has decided to focus his care upon comfort without a feeding tube.  Will continue to monitor his status.      Synthia Innocent NP [facility]  call [phone]

## 2024-02-28 ENCOUNTER — Non-Acute Institutional Stay (SKILLED_NURSING_FACILITY): Payer: Self-pay | Admitting: Internal Medicine

## 2024-02-28 ENCOUNTER — Encounter: Payer: Self-pay | Admitting: Internal Medicine

## 2024-02-28 DIAGNOSIS — F0150 Vascular dementia without behavioral disturbance: Secondary | ICD-10-CM

## 2024-02-28 DIAGNOSIS — E43 Unspecified severe protein-calorie malnutrition: Secondary | ICD-10-CM

## 2024-02-28 DIAGNOSIS — R627 Adult failure to thrive: Secondary | ICD-10-CM | POA: Diagnosis not present

## 2024-02-28 DIAGNOSIS — I4891 Unspecified atrial fibrillation: Secondary | ICD-10-CM

## 2024-02-28 NOTE — Assessment & Plan Note (Addendum)
 Nutritional intake has deteriorated recently in the context of influenza A.  He has marked limb atrophy.  He is at very high risk for aspiration pneumonia due to his deteriorating mental status which I told his wife was most likely due to cns ischemic disease progression rather than a complication of influenza A.Marland KitchenHis wife does not want to pursue a feeding tube.

## 2024-02-28 NOTE — Assessment & Plan Note (Addendum)
 His recent deterioration is in the context of acute influenza A; but very most impactful factor is most likely progression of his ischemic white matter disease.  DNR and supportive care are appropriate.

## 2024-02-28 NOTE — Progress Notes (Signed)
   NURSING HOME LOCATION:  [facility] ROOM NUMBER:  130 P  CODE STATUS:  DNR  PCP: Sharee Holster, NP   This is a nursing facility follow up visit for of chronic medical diagnoses to document compliance with Regulation 483.30 (c) in The Long Term Care Survey Manual Phase 2 which mandates caregiver visit ( visits can alternate among physician, PA or NP as per statutes) within 10 days of 30 days / 60 days/ 90 days post admission to SNF date  .  Interim medical record and care since last SNF visit was updated with review of diagnostic studies and change in clinical status since last visit were documented.  HPI: He is a permanent resident of this facility with medical diagnoses of history of stroke with aphasia; history of prior tobacco abuse, severe protein malnutrition, history of prostate cancer, diabetes with peripheral vascular disease, CAD with history of non-STEMI, essential hypertension, dyslipidemia, CKD, GERD, and history of A-fib. He has completed his quarantine and treatment for acute influenza A. For almost the past week he has had minimal p.o. intake with insufficient nutritional intake.  Additionally he has been more somnolent. His wife validates these findings stating he pockets liquids which later run out of his mouth.  Options including feeding tube have been discussed with his wife ; she declines this and accepts his clinical deterioration is progressing & irreversible most likely.. She states he has not been communicating with visitors ; but she states he confabulated his deceased son visited him. These findings are in the context of stable stage IIIb CKD. Glucoses are performed each morning, ac lunch, and at bedtime.  The range has been 136 up to 392 with an average of 244.  Review of systems could not be completed as he was nonverbal and cannot follow commands.  Physical exam:  Pertinent or positive findings: Initially he was soundly asleep exhibiting  hypopnea without snoring or apnea.  Subsequently when I began to examine his feet he did awaken and would look in my direction without seeming to focus. He remained nonverbal and could not follow commands.  He is missing anterior maxillary teeth.  He has inspiratory low-grade rhonchi bilaterally.  Heart rhythm is irregular; rate is adequately controlled.  Limb atrophy is present.  Strength is not tested as he would not oppose my hands.  There are 5 clustered benign appearing polypoid lesions medial to the left eyebrow.  General appearance:no acute distress, increased work of breathing is present.   Lymphatic: No lymphadenopathy about the head, neck, axilla. Eyes: No conjunctival inflammation or lid edema is present. There is no scleral icterus. Ears:  External ear exam shows no significant lesions or deformities.   Nose:  External nasal examination shows no deformity or inflammation. Nasal mucosa are pink and moist without lesions, exudates. Neck:  No thyromegaly, masses, tenderness noted.    Heart:  No gallop, murmur, click, rub .  Lungs:  without wheezes,  rales, rubs. Abdomen: Bowel sounds are normal. Abdomen is soft and nontender with no organomegaly, hernias, masses. GU: Deferred  Extremities:  No cyanosis, clubbing, edema  Neurologic exam : Cn 2-7 intact Strength equal  in upper & lower extremities Balance, Rhomberg, finger to nose testing could not be completed due to clinical state Deep tendon reflexes are equal Skin: Warm & dry w/o tenting. No significant rash.  See summary under each active problem in the Problem List with associated updated therapeutic plan

## 2024-02-28 NOTE — Assessment & Plan Note (Addendum)
 He is totally nonverbal and cannot follow commands.  There is been progression of his neurocognitive deficits clinically. DNR status clinically appropriate.

## 2024-02-28 NOTE — Patient Instructions (Signed)
 See assessment and plan under each diagnosis in the problem list and acutely for this visit

## 2024-03-02 ENCOUNTER — Encounter: Payer: Self-pay | Admitting: Internal Medicine

## 2024-03-02 NOTE — Assessment & Plan Note (Signed)
 Rhythm clinically irregular but rate adequately controlled. He is not on anticoagulation because of PMH of intracerebral hemorrhage & GI bleeding.

## 2024-03-18 ENCOUNTER — Other Ambulatory Visit (HOSPITAL_COMMUNITY): Payer: Self-pay | Admitting: Occupational Therapy

## 2024-03-18 DIAGNOSIS — R059 Cough, unspecified: Secondary | ICD-10-CM

## 2024-03-18 DIAGNOSIS — R1312 Dysphagia, oropharyngeal phase: Secondary | ICD-10-CM

## 2024-03-20 ENCOUNTER — Encounter (HOSPITAL_COMMUNITY): Payer: Self-pay | Admitting: Speech Pathology

## 2024-03-20 ENCOUNTER — Ambulatory Visit (HOSPITAL_COMMUNITY)
Admission: RE | Admit: 2024-03-20 | Discharge: 2024-03-20 | Disposition: A | Discharge status: Home / Self Care | Source: Ambulatory Visit | Attending: Internal Medicine | Admitting: Internal Medicine

## 2024-03-20 ENCOUNTER — Ambulatory Visit (HOSPITAL_COMMUNITY): Attending: Internal Medicine | Admitting: Speech Pathology

## 2024-03-20 DIAGNOSIS — R4701 Aphasia: Secondary | ICD-10-CM | POA: Diagnosis not present

## 2024-03-20 DIAGNOSIS — R059 Cough, unspecified: Secondary | ICD-10-CM | POA: Diagnosis not present

## 2024-03-20 DIAGNOSIS — R1312 Dysphagia, oropharyngeal phase: Secondary | ICD-10-CM | POA: Diagnosis not present

## 2024-03-20 DIAGNOSIS — R638 Other symptoms and signs concerning food and fluid intake: Secondary | ICD-10-CM | POA: Diagnosis not present

## 2024-03-20 NOTE — Therapy (Signed)
 [facility] at [facility] [address] [place], [place], [address] Phone: [phone]   Fax:  [phone]  Modified Barium Swallow  Patient Details  Name: [name] MRN: [id number] Date of Birth: 1946-07-28 No data recorded  Encounter Date: 03/20/2024   End of Session - 03/20/24 1548     Visit Number 1    Number of Visits 1    Activity Tolerance Patient tolerated treatment well             HPI/PMH: HPI: [name] is a68 y/o permanent resident of a skilled facility with medical diagnoses of history of stroke with aphasia; history of prior tobacco abuse, severe protein malnutrition, history of prostate cancer, diabetes with peripheral vascular disease, CAD with history of non-STEMI, essential hypertension, dyslipidemia, CKD, GERD, and history of A-fib. Pt was recently downgraded to D2/thin liquids by treating SLP d/t decreased alertness, fluctuating mentation and pocketing. MBSS requested to determine least restrictive diet   Clinical Impression: Pt presents with primary cognitive based mild oropharyngeal dysphagia. Pt refused majority of trials attempted and eventually Pt's wife was asked to help facilitate and encourage Pt. Pt did demonstrate moderate amounts of silent aspiration with thin liquids via straw (Pt was more receptive to thin trials via straw). Limited trials of thin via cup resulted in penetration but no aspiration. Wife reports Pt drinks thin out of a controlled cup that only releases minimal amounts of liquid at a time and they have been adhering to the strategy of no straws for this patient at the facility. PT took a bite of regular but quickly expectorated it. With mech soft and puree textures note a timely oral stage followed by a timely swallowing trigger with trace pharyngeal residue cleared by reflexive repeat swallow. Based on limited trials consumed recommend D3/mechanical soft and thin liquids with NO STRAWS.  Recommend meds be administered crushed in puree. Per family report Pt self-feeds soft sandwiches and is unable to have these on D2 diet. Anticipate Pt will be more receptive to eat when he can self feed and eat foods of preference. HOWEVER; note Pt should not be fed PO if he is lethargic or not aware or responsive to PO provided. Please note this recommendation is based on a brief time with limited trials and fluctuating mentation should be considered when providing PO. Defer further recommendations to treating SLP at facility. Thank you for this referral  Factors that may increase risk of adverse event in presence of aspiration [name] & [name] 2021): Factors that may increase risk of adverse event in presence of aspiration [name] & [name] 2021): Reduced cognitive function   Recommendations/Plan: Swallowing Evaluation Recommendations Swallowing Evaluation Recommendations Recommendations: PO diet PO Diet Recommendation: Dysphagia 3 (Mechanical soft); Thin liquids (Level 0) Liquid Administration via: Cup; No straw Medication Administration: Crushed with puree Supervision: Patient able to self-feed Swallowing strategies  : Slow rate; Small bites/sips Postural changes: Position pt fully upright for meals Oral care recommendations: Oral care BID (2x/day)    Treatment Plan Treatment Plan Treatment recommendations: Therapy as outlined in treatment plan below Follow-up recommendations: Skilled nursing-short term rehab (<3 hours/day)     Recommendations Recommendations for follow up therapy are one component of a multi-disciplinary discharge planning process, led by the attending physician.  Recommendations may be updated based on patient status, additional functional criteria and insurance authorization.  Assessment: Orofacial Exam: No data recorded  Anatomy:  Anatomy: WFL   Boluses Administered: Boluses Administered Boluses Administered: Thin  liquids (Level 0); Mildly thick  liquids (Level 2, nectar thick); Puree     Oral Impairment Domain: Oral Impairment Domain Lip Closure: Escape beyond mid-chin Tongue control during bolus hold: Cohesive bolus between tongue to palatal seal Bolus preparation/mastication: Timely and efficient chewing and mashing Bolus transport/lingual motion: Delayed initiation of tongue motion (oral holding) Oral residue: Trace residue lining oral structures Location of oral residue : Tongue     Pharyngeal Impairment Domain: Pharyngeal Impairment Domain Soft palate elevation: No bolus between soft palate (SP)/pharyngeal wall (PW) Laryngeal elevation: Complete superior movement of thyroid cartilage with complete approximation of arytenoids to epiglottic petiole Anterior hyoid excursion: Complete anterior movement Epiglottic movement: Partial inversion Laryngeal vestibule closure: Incomplete, narrow column air/contrast in laryngeal vestibule Pharyngeal stripping wave : Present - diminished Pharyngoesophageal segment opening: Complete distension and complete duration, no obstruction of flow Tongue base retraction: No contrast between tongue base and posterior pharyngeal wall (PPW) Pharyngeal residue: Trace residue within or on pharyngeal structures Location of pharyngeal residue: Pharyngeal wall; Pyriform sinuses     Esophageal Impairment Domain: No data recorded  Pill: Pill Consistency administered: -- (not administered)    Penetration/Aspiration Scale Score: Penetration/Aspiration Scale Score 1.  Material does not enter airway: Mildly thick liquids (Level 2, nectar thick); Puree 8.  Material enters airway, passes BELOW cords without attempt by patient to eject out (silent aspiration) : Thin liquids (Level 0)    Compensatory Strategies: Compensatory Strategies Compensatory strategies: No       General Information: Caregiver present: Yes   Diet Prior to this Study: Dysphagia 2 (finely chopped); Thin liquids (Level  0)    Temperature : Normal    Respiratory Status: WFL    Supplemental O2: None (Room air)    History of Recent Intubation: No   Behavior/Cognition: Alert; Cooperative; Pleasant mood  Self-Feeding Abilities: Able to self-feed; Needs set-up for self-feeding; Needs assist with self-feeding  Baseline vocal quality/speech: Normal  Volitional Cough: Able to elicit  Volitional Swallow: Unable to elicit  No data recorded  Goal Planning: No data recorded No data recorded No data recorded No data recorded No data recorded  Pain: No data recorded  End of Session: Start Time:No data recorded Stop Time: No data recorded Time Calculation:No data recorded Charges: No data recorded SLP visit diagnosis: SLP Visit Diagnosis: Dysphagia, unspecified (R13.10)    Past Medical History:  Past Medical History:  Diagnosis Date   A-fib (HCC)    a. Post-op afib after CABG 08/2012.   Acute gastric ulcer with hemorrhage    Acute respiratory failure with hypoxia (HCC)    Aphasia following cerebral infarction    CAD (coronary artery disease)    a. NSTEMI s/p stent to distal RCA 03/2000. b. Inf MI s/p emergent thrombectomy/stenting mid RCA 10/2000. c. NSTEMI s/p CABGx4 (LIMA-LAD, SVG-OM2, seq SVG-acute marginal and PD) 08/16/12 - course complicated by confusion, post-op AF, L pleural effusion with thoracentesis. d. Normal LV function by echo 08/2012.   Diverticulosis    DM with CKD    Dysphagia following cerebral infarction    Encephalopathy    Extended spectrum beta lactamase (ESBL) resistance    Generalized anxiety disorder    GERD (gastroesophageal reflux disease)    Hemiplegia and hemiparesis following cerebral infarction affecting right dominant side (HCC)    Hiatal hernia    HLD (hyperlipidemia)    HTN (hypertension)    Non-ST elevation (NSTEMI) myocardial infarction [facility])    Nontraumatic intracerebral hemorrhage in hemisphere, subcortical (HCC)  Peripheral vascular disease (HCC)     a. Carotid dopplers neg 08/13/12. b. Pre-cabg ABIs - R=0.87 suggesting mild dz, L=1.29 possibly falsely elevated due to calcified vessels.   Pleural effusion    a. L pleural eff after CABG s/p thoracentesis 08/22/12.   Prostate cancer [facility])    Status post radiation treatment.   Prostatic hypertrophy    a. Hx of urinary retention, awaiting TURP.   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 Mild  MR by TEE 08/2012.   Past Surgical History:  Past Surgical History:  Procedure Laterality Date   APPENDECTOMY     BACK SURGERY     CORONARY ARTERY BYPASS GRAFT  08/16/2012   Procedure: CORONARY ARTERY BYPASS GRAFTING (CABG);  Surgeon: Loreli Slot, MD;  Location: [facility] OR;  Service: Open Heart Surgery;  Laterality: N/A;   IR REPLC GASTRO/COLONIC TUBE PERCUT W/FLUORO  02/13/2019   LEFT HEART CATHETERIZATION WITH CORONARY ANGIOGRAM N/A 08/14/2012   Procedure: LEFT HEART CATHETERIZATION WITH CORONARY ANGIOGRAM;  Surgeon: Peter M Swaziland, MD;  Location: [facility] CATH LAB;  Service: Cardiovascular;  Laterality: N/A;   Ronny Korff H. Romie Levee, CCC-SLP Speech Language Pathologist  Georgetta Haber 03/20/2024, 3:50 PM Georgetta Haber, CCC-SLP 03/20/2024, 3:49 PM  [facility] Outpatient Rehabilitation at [facility] [address] [place], [place], [address] Phone: [phone]   Fax:  [phone]  Name: [name] MRN: [id number] Date of Birth: Feb 03, 1946

## 2024-03-22 ENCOUNTER — Encounter: Payer: Self-pay | Admitting: Adult Health

## 2024-03-22 ENCOUNTER — Non-Acute Institutional Stay (SKILLED_NURSING_FACILITY): Payer: Self-pay | Admitting: Adult Health

## 2024-03-22 DIAGNOSIS — F0150 Vascular dementia without behavioral disturbance: Secondary | ICD-10-CM | POA: Diagnosis not present

## 2024-03-22 DIAGNOSIS — I739 Peripheral vascular disease, unspecified: Secondary | ICD-10-CM | POA: Diagnosis not present

## 2024-03-22 DIAGNOSIS — I700 Atherosclerosis of aorta: Secondary | ICD-10-CM | POA: Diagnosis not present

## 2024-03-22 NOTE — Progress Notes (Signed)
 Location:  [facility] Room Number: [id number] Place of Service:  SNF (31)   CODE STATUS: dnr   No Known Allergies  Chief Complaint  Patient presents with   Acute Visit    Care plan meeting     HPI:  We have come together for his care plan meeting. Family present. BIMS no mood no. He is out of bed to geri-chair daily without falls. He requires dependent assist with his adl care. He is incontinent of bladder and bowel. Dietary: setup for meals; D3 with food in bowls; is on supplements daily. Weight is 161 pounds. Therapy: none at this time. He will continue to be followed for his chronic illnesses including: Peripheral vascular disease Aortic atherosclerosis   Vascular dementia without behavioral disturbance  Past Medical History:  Diagnosis Date   A-fib (HCC)    a. Post-op afib after CABG 08/2012.   Acute gastric ulcer with hemorrhage    Acute respiratory failure with hypoxia (HCC)    Aphasia following cerebral infarction    CAD (coronary artery disease)    a. NSTEMI s/p stent to distal RCA 03/2000. b. Inf MI s/p emergent thrombectomy/stenting mid RCA 10/2000. c. NSTEMI s/p CABGx4 (LIMA-LAD, SVG-OM2, seq SVG-acute marginal and PD) 08/16/12 - course complicated by confusion, post-op AF, L pleural effusion with thoracentesis. d. Normal LV function by echo 08/2012.   Diverticulosis    DM with CKD    Dysphagia following cerebral infarction    Encephalopathy    Extended spectrum beta lactamase (ESBL) resistance    Generalized anxiety disorder    GERD (gastroesophageal reflux disease)    Hemiplegia and hemiparesis following cerebral infarction affecting right dominant side (HCC)    Hiatal hernia    HLD (hyperlipidemia)    HTN (hypertension)    Non-ST elevation (NSTEMI) myocardial infarction [facility])    Nontraumatic intracerebral hemorrhage in hemisphere, subcortical [facility])    Peripheral vascular disease (HCC)    a. Carotid dopplers neg 08/13/12. b. Pre-cabg ABIs - R=0.87  suggesting mild dz, L=1.29 possibly falsely elevated due to calcified vessels.   Pleural effusion    a. L pleural eff after CABG s/p thoracentesis 08/22/12.   Prostate cancer [facility])    Status post radiation treatment.   Prostatic hypertrophy    a. Hx of urinary retention, awaiting TURP.   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 Mild  MR by TEE 08/2012.    Past Surgical History:  Procedure Laterality Date   APPENDECTOMY     BACK SURGERY     CORONARY ARTERY BYPASS GRAFT  08/16/2012   Procedure: CORONARY ARTERY BYPASS GRAFTING (CABG);  Surgeon: Loreli Slot, MD;  Location: [facility] [place] OR;  Service: Open Heart Surgery;  Laterality: N/A;   IR REPLC GASTRO/COLONIC TUBE PERCUT W/FLUORO  02/13/2019   LEFT HEART CATHETERIZATION WITH CORONARY ANGIOGRAM N/A 08/14/2012   Procedure: LEFT HEART CATHETERIZATION WITH CORONARY ANGIOGRAM;  Surgeon: Peter M Swaziland, MD;  Location: [facility] CATH LAB;  Service: Cardiovascular;  Laterality: N/A;    Social History   Socioeconomic History   Marital status: Married    Spouse name: Not on file   Number of children: Not on file   Years of education: Not on file   Highest education level: Not on file  Occupational History   Occupation: retired   Tobacco Use  Smoking status: Former    Current packs/day: 0.00    Types: Cigarettes    Quit date: 11/12/1972    Years since quitting: 51.3   Smokeless tobacco: Never  Vaping Use   Vaping status: Never Used  Substance and Sexual Activity   Alcohol use: No   Drug use: No   Sexual activity: Not Currently  Other Topics Concern   Not on file  Social History Narrative   He is a long term patient of [facility]    Social Drivers of Corporate investment banker Strain: Low Risk  (07/01/2019)   Overall Financial Resource Strain (CARDIA)    Difficulty of Paying Living Expenses: Not hard at all  Food  Insecurity: No Food Insecurity (02/03/2021)   Received from [facility], [facility]   Hunger Vital Sign    Worried About Running Out of Food in the Last Year: Never true    Ran Out of Food in the Last Year: Never true  Transportation Needs: Unknown (07/01/2019)   PRAPARE - Transportation    Lack of Transportation (Medical): Patient declined    Lack of Transportation (Non-Medical): Patient declined  Physical Activity: Inactive (10/30/2019)   Exercise Vital Sign    Days of Exercise per Week: 0 days    Minutes of Exercise per Session: 0 min  Stress: Unknown (07/01/2019)   [employer] of Occupational Health - Occupational Stress Questionnaire    Feeling of Stress : Patient declined  Social Connections: Unknown (04/25/2022)   Received from [facility], [facility]   Social Network    Social Network: Not on file  Intimate Partner Violence: Unknown (03/17/2022)   Received from [facility], [facility]   HITS    Physically Hurt: Not on file    Insult or Talk Down To: Not on file    Threaten Physical Harm: Not on file    Scream or Curse: Not on file   Family History  Problem Relation Age of Onset   Hypertension Mother       VITAL SIGNS BP 134/74   Pulse 80   Temp 98.6 F (37 C)   Resp 20   Ht 5\' 8"  (1.727 m)   Wt 161 lb (73 kg)   SpO2 97%   BMI 24.48 kg/m   Outpatient Encounter Medications as of 03/22/2024  Medication Sig   acetaminophen (TYLENOL) 325 MG tablet Take 650 mg by mouth 3 (three) times daily as needed for mild pain (pain score 1-3).   amLODipine (NORVASC) 2.5 MG tablet Take 2.5 mg by mouth daily.   baclofen (LIORESAL) 10 MG tablet Take 5 mg by mouth 3 (three) times daily.   Balsam [place]-Castor Oil (VENELEX) OINT Apply topically.   BD AUTOSHIELD DUO 30G X 5 MM MISC 3/16"   clotrimazole (LOTRIMIN) 1 % cream Apply 1 Application topically 2 (two) times daily.   Continuous Glucose Receiver (FREESTYLE LIBRE 3 READER) DEVI 1 Device by Does not apply route  daily.   Continuous Glucose Sensor (FREESTYLE LIBRE 3 SENSOR) MISC 1 Device by Does not apply route every 14 (fourteen) days. Place 1 sensor on the skin every 14 days. Use to check glucose continuously   FLUoxetine (PROZAC) 20 MG tablet Take 20 mg by mouth daily.   insulin glargine (LANTUS) 100 UNIT/ML injection Inject 16 Units into the skin daily.   insulin lispro (HUMALOG) 100 UNIT/ML cartridge Inject 5 Units into the skin in the morning and at bedtime. Give 5 units  with breakfast and  lunch give if he eats more than 50% of meals   loratadine (CLARITIN) 10 MG tablet Take 10 mg by mouth daily.   losartan (COZAAR) 25 MG tablet Take 25 mg by mouth daily. DX: Hypertensive heart and chronic kidney disease without heart failure, with stage 1 through stage 4 chronic kidney disease, or unspecified chronic kidney disease-per NP note, code updated   NON FORMULARY Diet: Regular; please put food in bowls for all meals   Nutritional Supplements (ENSURE ENLIVE PO) Take by mouth daily.   rOPINIRole (REQUIP) 0.25 MG tablet Take 0.25 mg by mouth at bedtime.   sennosides-docusate sodium (SENOKOT-S) 8.6-50 MG tablet Take 1 tablet by mouth in the morning and at bedtime.   No facility-administered encounter medications on file as of 03/22/2024.     SIGNIFICANT DIAGNOSTIC EXAMS   LABS REVIEWED PREVIOUS:     03-30-23: wbc 8.1; hgb 15.8; hct 48.4 mcv 93.1 plt 182; glucose 123; bun 34; creat 1.63; k+ 3.9; na++ 138; ca 8.9; gfr 43; protein 6.2 albumin 3.0; hgb A1c 6.9; chol 98; ldl 59; trig 77; ldl 24; tsh 1.280  07-20-23: hgb A1c  6.8 10-16-23: wbc 8.8; hgb 15.9; hct 49.9; mcv 93.8 plt 176; glucose 101; bun 32; creat 1.70; k+ 3.8; na++ 137; ca 9.1 gfr 41; protein 6.5 albumin 3.1 hgb A1c 6.8; chol 106; ldl 65; trig 72; hdl 27; tsh 2.208    TODAY  01-01-24: ACR 256  Review of Systems  Reason unable to perform ROS: aphasia.   Physical Exam Constitutional:      General: He is not in acute distress.    Appearance:  He is well-developed. He is not diaphoretic.  Neck:     Thyroid: No thyromegaly.  Cardiovascular:     Rate and Rhythm: Normal rate and regular rhythm.     Pulses: Normal pulses.     Heart sounds: Normal heart sounds.  Pulmonary:     Effort: Pulmonary effort is normal. No respiratory distress.     Breath sounds: Normal breath sounds.  Abdominal:     General: Bowel sounds are normal. There is no distension.     Palpations: Abdomen is soft.     Tenderness: There is no abdominal tenderness.  Musculoskeletal:     Cervical back: Neck supple.     Right lower leg: No edema.     Left lower leg: No edema.     Comments: Right hemiplegia     Lymphadenopathy:     Cervical: No cervical adenopathy.  Skin:    General: Skin is warm and dry.  Neurological:     Mental Status: He is alert. Mental status is at baseline.  Psychiatric:        Mood and Affect: Mood normal.     ASSESSMENT/ PLAN:  TODAY  Peripheral vascular disease Aortic atherosclerosis Vascular dementia without behavioral disturbance   Will continue current medications Will continue current plan of care Will continue to monitor his status.   Time spent with patient: 40 minutes: medications; dietary; plan of care    Synthia Innocent NP [facility]   call [phone]

## 2024-04-01 DIAGNOSIS — F5105 Insomnia due to other mental disorder: Secondary | ICD-10-CM | POA: Diagnosis not present

## 2024-04-01 DIAGNOSIS — F331 Major depressive disorder, recurrent, moderate: Secondary | ICD-10-CM | POA: Diagnosis not present

## 2024-04-24 ENCOUNTER — Encounter: Payer: Self-pay | Admitting: Adult Health

## 2024-04-24 ENCOUNTER — Non-Acute Institutional Stay (SKILLED_NURSING_FACILITY): Payer: Self-pay | Admitting: Adult Health

## 2024-04-24 DIAGNOSIS — M8580 Other specified disorders of bone density and structure, unspecified site: Secondary | ICD-10-CM

## 2024-04-24 DIAGNOSIS — Z86718 Personal history of other venous thrombosis and embolism: Secondary | ICD-10-CM | POA: Diagnosis not present

## 2024-04-24 DIAGNOSIS — E785 Hyperlipidemia, unspecified: Secondary | ICD-10-CM

## 2024-04-24 DIAGNOSIS — E1169 Type 2 diabetes mellitus with other specified complication: Secondary | ICD-10-CM

## 2024-04-24 NOTE — Progress Notes (Signed)
 Location:  [facility] Room Number: 146 Place of Service:  SNF (31)   CODE STATUS: dnr   No Known Allergies  Chief Complaint  Patient presents with   Medical Management of Chronic Issues         History of DVT:  Osteopenia: Dyslipidemia associated with type 2 diabetes mellitus    HPI:  He is a 78 y.o. long term resident of this facility being seen for the management of his chronic illnesses:History of DVT:  Osteopenia: Dyslipidemia associated with type 2 diabetes mellitus. There are no reports of uncontrolled pain. His weight remains stable. He does have days in which he has increased lethargy.    Past Medical History:  Diagnosis Date   A-fib [facility])    a. Post-op afib after CABG 08/2012.   Acute gastric ulcer with hemorrhage    Acute respiratory failure with hypoxia (HCC)    Aphasia following cerebral infarction    CAD (coronary artery disease)    a. NSTEMI s/p stent to distal RCA 03/2000. b. Inf MI s/p emergent thrombectomy/stenting mid RCA 10/2000. c. NSTEMI s/p CABGx4 (LIMA-LAD, SVG-OM2, seq SVG-acute marginal and PD) 08/16/12 - course complicated by confusion, post-op AF, L pleural effusion with thoracentesis. d. Normal LV function by echo 08/2012.   Diverticulosis    DM with CKD    Dysphagia following cerebral infarction    Encephalopathy    Extended spectrum beta lactamase (ESBL) resistance    Generalized anxiety disorder    GERD (gastroesophageal reflux disease)    Hemiplegia and hemiparesis following cerebral infarction affecting right dominant side (HCC)    Hiatal hernia    HLD (hyperlipidemia)    HTN (hypertension)    Non-ST elevation (NSTEMI) myocardial infarction [facility])    Nontraumatic intracerebral hemorrhage in hemisphere, subcortical [facility])    Peripheral vascular disease (HCC)    a. Carotid dopplers neg 08/13/12. b. Pre-cabg ABIs - R=0.87 suggesting mild dz, L=1.29 possibly falsely elevated due to calcified vessels.   Pleural effusion    a. L  pleural eff after CABG s/p thoracentesis 08/22/12.   Prostate cancer [facility])    Status post radiation treatment.   Prostatic hypertrophy    a. Hx of urinary retention, awaiting TURP.   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 Mild  MR by TEE 08/2012.    Past Surgical History:  Procedure Laterality Date   APPENDECTOMY     BACK SURGERY     CORONARY ARTERY BYPASS GRAFT  08/16/2012   Procedure: CORONARY ARTERY BYPASS GRAFTING (CABG);  Surgeon: Zelphia Higashi, MD;  [facility] OR;  Service: Open Heart Surgery;  Laterality: N/A;   IR REPLC GASTRO/COLONIC TUBE PERCUT W/FLUORO  02/13/2019   LEFT HEART CATHETERIZATION WITH CORONARY ANGIOGRAM N/A 08/14/2012   Procedure: LEFT HEART CATHETERIZATION WITH CORONARY ANGIOGRAM;  Surgeon: Peter M Swaziland, MD;  Location: [facility] CATH LAB;  Service: Cardiovascular;  Laterality: N/A;    Social History   Socioeconomic History   Marital status: Married    Spouse name: Not on file   Number of children: Not on file   Years of education: Not on file   Highest education level: Not on file  Occupational History   Occupation: retired   Tobacco Use   Smoking status: Former    Current packs/day: 0.00    Types: Cigarettes  Quit date: 11/12/1972    Years since quitting: 51.4   Smokeless tobacco: Never  Vaping Use   Vaping status: Never Used  Substance and Sexual Activity   Alcohol use: No   Drug use: No   Sexual activity: Not Currently  Other Topics Concern   Not on file  Social History Narrative   He is a long term patient of [facility]    Social Drivers of Health   Financial Resource Strain: Low Risk  (07/01/2019)   Overall Financial Resource Strain (CARDIA)    Difficulty of Paying Living Expenses: Not hard at all  Food Insecurity: No Food Insecurity (02/03/2021)   Received from [facility], [facility]   Hunger Vital Sign     Worried About Running Out of Food in the Last Year: Never true    Ran Out of Food in the Last Year: Never true  Transportation Needs: Unknown (07/01/2019)   PRAPARE - Transportation    Lack of Transportation (Medical): Patient declined    Lack of Transportation (Non-Medical): Patient declined  Physical Activity: Inactive (10/30/2019)   Exercise Vital Sign    Days of Exercise per Week: 0 days    Minutes of Exercise per Session: 0 min  Stress: Unknown (07/01/2019)   [employer] of Occupational Health - Occupational Stress Questionnaire    Feeling of Stress : Patient declined  Social Connections: Unknown (04/25/2022)   Received from [facility], [facility]   Social Network    Social Network: Not on file  Intimate Partner Violence: Unknown (03/17/2022)   Received from [employer], [facility]   HITS    Physically Hurt: Not on file    Insult or Talk Down To: Not on file    Threaten Physical Harm: Not on file    Scream or Curse: Not on file   Family History  Problem Relation Age of Onset   Hypertension Mother       VITAL SIGNS BP (!) 140/74   Pulse 61   Temp (!) 97.3 F (36.3 C)   Resp 18   Ht 5\' 8"  (1.727 m)   Wt 163 lb (73.9 kg)   SpO2 96%   BMI 24.78 kg/m   Outpatient Encounter Medications as of 04/24/2024  Medication Sig   acetaminophen  (TYLENOL ) 325 MG tablet Take 650 mg by mouth 3 (three) times daily as needed for mild pain (pain score 1-3).   amLODipine  (NORVASC ) 2.5 MG tablet Take 2.5 mg by mouth daily.   baclofen (LIORESAL) 10 MG tablet Take 5 mg by mouth 3 (three) times daily.   Balsam [place]-Castor Oil (VENELEX) OINT Apply topically.   BD AUTOSHIELD DUO 30G X 5 MM MISC 3/16"   clotrimazole (LOTRIMIN) 1 % cream Apply 1 Application topically 2 (two) times daily.   Continuous Glucose Receiver (FREESTYLE LIBRE 3 READER) DEVI 1 Device by Does not apply route daily.   Continuous Glucose Sensor (FREESTYLE LIBRE 3 SENSOR) MISC 1 Device by Does not apply route  every 14 (fourteen) days. Place 1 sensor on the skin every 14 days. Use to check glucose continuously   FLUoxetine  (PROZAC ) 20 MG tablet Take 20 mg by mouth daily.   insulin  glargine (LANTUS ) 100 UNIT/ML injection Inject 16 Units into the skin daily.   insulin  lispro (HUMALOG ) 100 UNIT/ML cartridge Inject 5 Units into the skin in the morning and at bedtime. Give 5 units  with breakfast and lunch give if he eats more than 50% of meals   loratadine (CLARITIN) 10  MG tablet Take 10 mg by mouth daily.   losartan (COZAAR) 25 MG tablet Take 25 mg by mouth daily. DX: Hypertensive heart and chronic kidney disease without heart failure, with stage 1 through stage 4 chronic kidney disease, or unspecified chronic kidney disease-per NP note, code updated   NON FORMULARY Diet: Regular; please put food in bowls for all meals   Nutritional Supplements (ENSURE ENLIVE PO) Take by mouth daily.   rOPINIRole (REQUIP) 0.25 MG tablet Take 0.25 mg by mouth at bedtime.   sennosides-docusate sodium  (SENOKOT-S) 8.6-50 MG tablet Take 1 tablet by mouth in the morning and at bedtime.   No facility-administered encounter medications on file as of 04/24/2024.     SIGNIFICANT DIAGNOSTIC EXAMS  LABS REVIEWED PREVIOUS:     07-20-23: hgb A1c  6.8 10-16-23: wbc 8.8; hgb 15.9; hct 49.9; mcv 93.8 plt 176; glucose 101; bun 32; creat 1.70; k+ 3.8; na++ 137; ca 9.1 gfr 41; protein 6.5 albumin  3.1 hgb A1c 6.8; chol 106; ldl 65; trig 72; hdl 27; tsh 2.208    TODAY  01-01-24: ACR 256  Review of Systems  Reason unable to perform ROS: aphasia.   Physical Exam Constitutional:      General: He is not in acute distress.    Appearance: He is well-developed. He is not diaphoretic.  Neck:     Thyroid : No thyromegaly.  Cardiovascular:     Rate and Rhythm: Normal rate and regular rhythm.     Pulses: Normal pulses.     Heart sounds: Normal heart sounds.  Pulmonary:     Effort: Pulmonary effort is normal. No respiratory distress.      Breath sounds: Normal breath sounds.  Abdominal:     General: Bowel sounds are normal. There is no distension.     Palpations: Abdomen is soft.     Tenderness: There is no abdominal tenderness.  Musculoskeletal:     Cervical back: Neck supple.     Right lower leg: No edema.     Left lower leg: No edema.     Comments: Right hemiplegia      Lymphadenopathy:     Cervical: No cervical adenopathy.  Skin:    General: Skin is warm and dry.  Neurological:     Mental Status: He is alert. Mental status is at baseline.  Psychiatric:        Mood and Affect: Mood normal.    ASSESSMENT/ PLAN:  TODAY:   History of DVT: has completed eliquis  therapy  2. Osteopenia: t scoe -1.421 is on supplements  3. Dyslipidemia associated with type 2 diabetes mellitus: ldl 65 will continue lipitor  40 mg daily   PREVIOUS     4. Micro-albuminuria due to type 2 diabetes mellitus: 382.3 is on ARB  5. Major depression with psychotic features: is taking prozac  20 mg daily   6. Chronic constipation: is on senna s twice daily   7. Chronic urine retention/bladder spasms: foley is out; not on medications  8. GERD without esophagitis: prevacid  has been stopped  9. Chronic non-seasonal allergic rhinitis: will continue claritin 10 mg daily   10. CKD stage 3 due to type 2 diabetes mellitus: bun 32; creat 1.70; gfr 41  11. Type 2 diabetes mellitus with peripheral vascular disease: hgb A1c 6.8: will continue humalog  5 units with meals twice daily lantus  20 units nightly is on statin and arb  12. Protein calorie malnutrition, severe; albumin  3.1 will continue supplements as directed.    13. History of  nontraumatic subcortical hemorrhage left cerebral hemisphere/right spastic hemiplegia  (05-22-21) is on baclofen 5 mg three times daily for spasticity   14. Restless leg syndrome; is not on medications at this time  15. Chronic generalized pain: will continue tylenol  650 mg twice daily   16. Aortic  atherosclerosis (ct 01-04-19) is on statin  17. CAD native heart without angina: s/p cabg 2012 on cozaar 25 mg daily   18. Hypertension associated with type 2 diabetes mellitus: b/p 140/74 :will continue cozaar 25 mg daily and norvasc  5 mg daily    Will check cbc; cmp; hgb A1c; lipids; psa   Britt Candle NP [facility]  call [phone]

## 2024-04-25 ENCOUNTER — Other Ambulatory Visit (HOSPITAL_COMMUNITY)
Admission: RE | Admit: 2024-04-25 | Discharge: 2024-04-25 | Disposition: A | Discharge status: Home / Self Care | Source: Skilled Nursing Facility | Attending: Adult Health | Admitting: Adult Health

## 2024-04-25 DIAGNOSIS — E1151 Type 2 diabetes mellitus with diabetic peripheral angiopathy without gangrene: Secondary | ICD-10-CM | POA: Insufficient documentation

## 2024-04-25 DIAGNOSIS — E785 Hyperlipidemia, unspecified: Secondary | ICD-10-CM | POA: Insufficient documentation

## 2024-04-25 LAB — CBC
HCT: 48.1 % (ref 39.0–52.0)
Hemoglobin: 15.2 g/dL (ref 13.0–17.0)
MCH: 29.8 pg (ref 26.0–34.0)
MCHC: 31.6 g/dL (ref 30.0–36.0)
MCV: 94.3 fL (ref 80.0–100.0)
Platelets: 225 10*3/uL (ref 150–400)
RBC: 5.1 MIL/uL (ref 4.22–5.81)
RDW: 12.8 % (ref 11.5–15.5)
WBC: 7.6 10*3/uL (ref 4.0–10.5)
nRBC: 0 % (ref 0.0–0.2)

## 2024-04-25 LAB — COMPREHENSIVE METABOLIC PANEL WITH GFR
ALT: 16 U/L (ref 0–44)
AST: 21 U/L (ref 15–41)
Albumin: 2.8 g/dL — ABNORMAL LOW (ref 3.5–5.0)
Alkaline Phosphatase: 89 U/L (ref 38–126)
Anion gap: 5 (ref 5–15)
BUN: 32 mg/dL — ABNORMAL HIGH (ref 8–23)
CO2: 26 mmol/L (ref 22–32)
Calcium: 9.2 mg/dL (ref 8.9–10.3)
Chloride: 105 mmol/L (ref 98–111)
Creatinine, Ser: 1.48 mg/dL — ABNORMAL HIGH (ref 0.61–1.24)
GFR, Estimated: 48 mL/min — ABNORMAL LOW (ref >60)
Glucose, Bld: 84 mg/dL (ref 70–99)
Potassium: 4 mmol/L (ref 3.5–5.1)
Sodium: 136 mmol/L (ref 135–145)
Total Bilirubin: 0.8 mg/dL (ref 0.0–1.2)
Total Protein: 6.8 g/dL (ref 6.5–8.1)

## 2024-04-25 LAB — LIPID PANEL
Cholesterol: 166 mg/dL (ref 0–200)
HDL: 25 mg/dL — ABNORMAL LOW (ref >40)
LDL Cholesterol: 114 mg/dL — ABNORMAL HIGH (ref 0–99)
Total CHOL/HDL Ratio: 6.6 ratio
Triglycerides: 133 mg/dL (ref <150)
VLDL: 27 mg/dL (ref 0–40)

## 2024-04-25 LAB — HEMOGLOBIN A1C
Hgb A1c MFr Bld: 7.5 % — ABNORMAL HIGH (ref 4.8–5.6)
Mean Plasma Glucose: 168.55 mg/dL

## 2024-04-25 LAB — TSH: TSH: 2.449 u[IU]/mL (ref 0.350–4.500)

## 2024-04-25 LAB — PSA: Prostatic Specific Antigen: 0.96 ng/mL (ref 0.00–4.00)

## 2024-05-02 DIAGNOSIS — F331 Major depressive disorder, recurrent, moderate: Secondary | ICD-10-CM | POA: Diagnosis not present

## 2024-05-02 DIAGNOSIS — F5105 Insomnia due to other mental disorder: Secondary | ICD-10-CM | POA: Diagnosis not present

## 2024-05-08 DIAGNOSIS — L602 Onychogryphosis: Secondary | ICD-10-CM | POA: Diagnosis not present

## 2024-05-08 DIAGNOSIS — E1151 Type 2 diabetes mellitus with diabetic peripheral angiopathy without gangrene: Secondary | ICD-10-CM | POA: Diagnosis not present

## 2024-05-08 DIAGNOSIS — L600 Ingrowing nail: Secondary | ICD-10-CM | POA: Diagnosis not present

## 2024-05-20 ENCOUNTER — Encounter: Payer: Self-pay | Admitting: Adult Health

## 2024-05-20 ENCOUNTER — Non-Acute Institutional Stay (SKILLED_NURSING_FACILITY): Payer: Self-pay | Admitting: Adult Health

## 2024-05-20 DIAGNOSIS — F323 Major depressive disorder, single episode, severe with psychotic features: Secondary | ICD-10-CM

## 2024-05-20 DIAGNOSIS — R809 Proteinuria, unspecified: Secondary | ICD-10-CM

## 2024-05-20 DIAGNOSIS — E1169 Type 2 diabetes mellitus with other specified complication: Secondary | ICD-10-CM | POA: Diagnosis not present

## 2024-05-20 DIAGNOSIS — E785 Hyperlipidemia, unspecified: Secondary | ICD-10-CM

## 2024-05-20 DIAGNOSIS — E1129 Type 2 diabetes mellitus with other diabetic kidney complication: Secondary | ICD-10-CM

## 2024-05-20 NOTE — Progress Notes (Signed)
 Location:  [facility] Room Number: [id number] Place of Service:  SNF (31)   CODE STATUS: dnr   No Known Allergies  Chief Complaint  Patient presents with   Medical Management of Chronic Issues           Dyslipidemia associated with type 2 diabetes mellitus: Micro-albuminuria due to type 2 diabetes mellitus:  Major depression with psychotic features    HPI:  He is a 78 y.o. long term resident of this facility being seen for the management of his chronic illnesses:Dyslipidemia associated with type 2 diabetes mellitus: Micro-albuminuria due to type 2 diabetes mellitus:  Major depression with psychotic features. There are no reports of uncontrolled pain. There are no reports of anxiety or depressive thoughts.    Past Medical History:  Diagnosis Date   A-fib [facility])    a. Post-op afib after CABG 08/2012.   Acute gastric ulcer with hemorrhage    Acute respiratory failure with hypoxia (HCC)    Aphasia following cerebral infarction    CAD (coronary artery disease)    a. NSTEMI s/p stent to distal RCA 03/2000. b. Inf MI s/p emergent thrombectomy/stenting mid RCA 10/2000. c. NSTEMI s/p CABGx4 (LIMA-LAD, SVG-OM2, seq SVG-acute marginal and PD) 08/16/12 - course complicated by confusion, post-op AF, L pleural effusion with thoracentesis. d. Normal LV function by echo 08/2012.   Diverticulosis    DM with CKD    Dysphagia following cerebral infarction    Encephalopathy    Extended spectrum beta lactamase (ESBL) resistance    Generalized anxiety disorder    GERD (gastroesophageal reflux disease)    Hemiplegia and hemiparesis following cerebral infarction affecting right dominant side (HCC)    Hiatal hernia    HLD (hyperlipidemia)    HTN (hypertension)    Non-ST elevation (NSTEMI) myocardial infarction [facility])    Nontraumatic intracerebral hemorrhage in hemisphere, subcortical [facility])    Peripheral vascular disease (HCC)    a. Carotid dopplers neg 08/13/12. b. Pre-cabg ABIs - R=0.87  suggesting mild dz, L=1.29 possibly falsely elevated due to calcified vessels.   Pleural effusion    a. L pleural eff after CABG s/p thoracentesis 08/22/12.   Prostate cancer [facility])    Status post radiation treatment.   Prostatic hypertrophy    a. Hx of urinary retention, awaiting TURP.   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 Mild  MR by TEE 08/2012.    Past Surgical History:  Procedure Laterality Date   APPENDECTOMY     BACK SURGERY     CORONARY ARTERY BYPASS GRAFT  08/16/2012   Procedure: CORONARY ARTERY BYPASS GRAFTING (CABG);  Surgeon: Zelphia Higashi, MD;  Location: [facility] OR;  Service: Open Heart Surgery;  Laterality: N/A;   IR REPLC GASTRO/COLONIC TUBE PERCUT W/FLUORO  02/13/2019   LEFT HEART CATHETERIZATION WITH CORONARY ANGIOGRAM N/A 08/14/2012   Procedure: LEFT HEART CATHETERIZATION WITH CORONARY ANGIOGRAM;  Surgeon: Peter M Swaziland, MD;  Location: [facility] CATH LAB;  Service: Cardiovascular;  Laterality: N/A;    Social History   Socioeconomic History   Marital status: Married    Spouse name: Not on file   Number of children: Not on file   Years of education: Not on file   Highest education level: Not on file  Occupational History   Occupation: retired   Tobacco Use   Smoking  status: Former    Current packs/day: 0.00    Types: Cigarettes    Quit date: 11/12/1972    Years since quitting: 51.5   Smokeless tobacco: Never  Vaping Use   Vaping status: Never Used  Substance and Sexual Activity   Alcohol use: No   Drug use: No   Sexual activity: Not Currently  Other Topics Concern   Not on file  Social History Narrative   He is a long term patient of [facility]    Social Drivers of Corporate investment banker Strain: Low Risk  (07/01/2019)   Overall Financial Resource Strain (CARDIA)    Difficulty of Paying Living Expenses: Not hard at all  Food  Insecurity: No Food Insecurity (02/03/2021)   Received from [facility], [facility]   Hunger Vital Sign    Worried About Running Out of Food in the Last Year: Never true    Ran Out of Food in the Last Year: Never true  Transportation Needs: Unknown (07/01/2019)   PRAPARE - Transportation    Lack of Transportation (Medical): Patient declined    Lack of Transportation (Non-Medical): Patient declined  Physical Activity: Inactive (10/30/2019)   Exercise Vital Sign    Days of Exercise per Week: 0 days    Minutes of Exercise per Session: 0 min  Stress: Unknown (07/01/2019)   [employer] of Occupational Health - Occupational Stress Questionnaire    Feeling of Stress : Patient declined  Social Connections: Unknown (04/25/2022)   Received from [facility], [facility]   Social Network    Social Network: Not on file  Intimate Partner Violence: Unknown (03/17/2022)   Received from [employer], [facility]   HITS    Physically Hurt: Not on file    Insult or Talk Down To: Not on file    Threaten Physical Harm: Not on file    Scream or Curse: Not on file   Family History  Problem Relation Age of Onset   Hypertension Mother       VITAL SIGNS BP 114/68   Pulse 71   Temp (!) 96.9 F (36.1 C)   Resp 19   Ht 5' 8.5" (1.74 m)   Wt 159 lb (72.1 kg)   SpO2 95%   BMI 23.82 kg/m   Outpatient Encounter Medications as of 05/20/2024  Medication Sig   acetaminophen  (TYLENOL ) 325 MG tablet Take 650 mg by mouth 3 (three) times daily as needed for mild pain (pain score 1-3).   amLODipine  (NORVASC ) 2.5 MG tablet Take 2.5 mg by mouth daily.   baclofen (LIORESAL) 10 MG tablet Take 5 mg by mouth 3 (three) times daily.   Balsam [place]-Castor Oil (VENELEX) OINT Apply topically.   BD AUTOSHIELD DUO 30G X 5 MM MISC 3/16"   clotrimazole (LOTRIMIN) 1 % cream Apply 1 Application topically 2 (two) times daily.   Continuous Glucose Receiver (FREESTYLE LIBRE 3 READER) DEVI 1 Device by Does not  apply route daily.   Continuous Glucose Sensor (FREESTYLE LIBRE 3 SENSOR) MISC 1 Device by Does not apply route every 14 (fourteen) days. Place 1 sensor on the skin every 14 days. Use to check glucose continuously   FLUoxetine  (PROZAC ) 20 MG tablet Take 20 mg by mouth daily.   insulin  glargine (LANTUS ) 100 UNIT/ML injection Inject 16 Units into the skin daily.   insulin  lispro (HUMALOG ) 100 UNIT/ML cartridge Inject 5 Units into the skin in the morning and at bedtime. Give 5 units  with breakfast and  lunch give if he eats more than 50% of meals   loratadine (CLARITIN) 10 MG tablet Take 10 mg by mouth daily.   losartan (COZAAR) 25 MG tablet Take 25 mg by mouth daily. DX: Hypertensive heart and chronic kidney disease without heart failure, with stage 1 through stage 4 chronic kidney disease, or unspecified chronic kidney disease-per NP note, code updated   NON FORMULARY Diet: Regular; please put food in bowls for all meals   Nutritional Supplements (ENSURE ENLIVE PO) Take by mouth daily.   rOPINIRole (REQUIP) 0.25 MG tablet Take 0.25 mg by mouth at bedtime.   sennosides-docusate sodium  (SENOKOT-S) 8.6-50 MG tablet Take 1 tablet by mouth in the morning and at bedtime.   No facility-administered encounter medications on file as of 05/20/2024.     SIGNIFICANT DIAGNOSTIC EXAMS  LABS REVIEWED PREVIOUS:     07-20-23: hgb A1c  6.8 10-16-23: wbc 8.8; hgb 15.9; hct 49.9; mcv 93.8 plt 176; glucose 101; bun 32; creat 1.70; k+ 3.8; na++ 137; ca 9.1 gfr 41; protein 6.5 albumin  3.1 hgb A1c 6.8; chol 106; ldl 65; trig 72; hdl 27; tsh 2.208   01-01-24: ACR 256  TODAY  04-25-24: wbc 7.6; hgb 15.9; hct 49.9; mcv 93.8 plt 176; glucose 84; bun 32; creat 1.48 ;k+ 4.0; na++ 134; ca 9.2 gfr 48; protein 6.8 albumin  2.8; tsh 2.449; psa 0.96; hgb A1c 7.5; chol 166; ldl 114; trig 133; hdl 25    Review of Systems  Reason unable to perform ROS: aphasia.   Physical Exam Constitutional:      General: He is not in acute  distress.    Appearance: He is well-developed. He is not diaphoretic.  Neck:     Thyroid : No thyromegaly.  Cardiovascular:     Rate and Rhythm: Normal rate. Rhythm irregular.     Pulses: Normal pulses.     Heart sounds: Normal heart sounds.  Pulmonary:     Effort: Pulmonary effort is normal. No respiratory distress.     Breath sounds: Normal breath sounds.  Abdominal:     General: Bowel sounds are normal. There is no distension.     Palpations: Abdomen is soft.     Tenderness: There is no abdominal tenderness.  Musculoskeletal:     Cervical back: Neck supple.     Right lower leg: No edema.     Left lower leg: No edema.     Comments: Right hemiplegia      Lymphadenopathy:     Cervical: No cervical adenopathy.  Skin:    General: Skin is warm and dry.  Neurological:     Mental Status: He is alert. Mental status is at baseline.  Psychiatric:        Mood and Affect: Mood normal.    ASSESSMENT/ PLAN:  TODAY:   Dyslipidemia associated with type 2 diabetes mellitus: LDL 114 will increase to crestor 10 mg daily   2. Micro-albuminuria due to type 2 diabetes mellitus: 382.3 is on ARB .  3. Major depression with psychotic features: is taking prozac  20 mg daily   PREVIOUS     4. Chronic constipation: is on senna s twice daily   5. Chronic urine retention/bladder spasms: foley is out; not on medications  6. GERD without esophagitis: prevacid  has been stopped  7. Chronic non-seasonal allergic rhinitis: will continue claritin 10 mg daily   8. CKD stage 3 due to type 2 diabetes mellitus: bun 32; creat 1.70; gfr 41  9. Type 2 diabetes mellitus  with peripheral vascular disease: hgb A1c 6.8: will continue humalog  5 units with meals twice daily lantus  20 units nightly is on statin and arb  10. Protein calorie malnutrition, severe; albumin  3.1 will continue supplements as directed.    11. History of nontraumatic subcortical hemorrhage left cerebral hemisphere/right spastic  hemiplegia  (05-22-21) is on baclofen 5 mg three times daily for spasticity   12. Restless leg syndrome; is not on medications at this time  13. Chronic generalized pain: will continue tylenol  650 mg twice daily   14. Aortic atherosclerosis (ct 01-04-19) is on statin  15. CAD native heart without angina: s/p cabg 2012 on cozaar 25 mg daily   16. Hypertension associated with type 2 diabetes mellitus: b/p 140/74 :will continue cozaar 25 mg daily and norvasc  5 mg daily   17. History of DVT: has completed eliquis  therapy  18. Osteopenia: t scoe -1.421 is on supplements   Britt Candle NP [facility] Adult Medicine  call [phone]

## 2024-05-28 ENCOUNTER — Telehealth: Payer: Self-pay | Admitting: Pharmacist

## 2024-05-28 DIAGNOSIS — E1169 Type 2 diabetes mellitus with other specified complication: Secondary | ICD-10-CM

## 2024-05-28 NOTE — Progress Notes (Deleted)
 Pharmacy Quality Measure Review  {Pharmacy Quality Options:29749}

## 2024-05-28 NOTE — Progress Notes (Addendum)
   05/28/2024  Patient ID: [name], male   DOB: 07-29-1946, 78 y.o.   MRN: [id number]  Pharmacy Quality Measure Review  This patient is appearing on a report for being at risk of failing the adherence measure for cholesterol (statin) medications this calendar year. AND Losartan [facility])  Medication: Atorvastatin   Last fill date: 02/05/24 for 22 day supply  Rosuvastatin 5 mg was filled by [name] for 9 tablets. They do monthly composite visits will assume passing.  Losartan 25 mg filled 04/04/24 for a 30 day supply--[facility] does 7-day cycle fills with monthly composite billing.   HgA1c-7.5% On statin therapy Geronimo Krabbe, PharmD, [facility] Clinical Pharmacist [phone]

## 2024-05-29 ENCOUNTER — Non-Acute Institutional Stay: Payer: Self-pay | Admitting: Adult Health

## 2024-05-29 DIAGNOSIS — H2511 Age-related nuclear cataract, right eye: Secondary | ICD-10-CM | POA: Diagnosis not present

## 2024-05-29 DIAGNOSIS — E113293 Type 2 diabetes mellitus with mild nonproliferative diabetic retinopathy without macular edema, bilateral: Secondary | ICD-10-CM | POA: Diagnosis not present

## 2024-05-30 ENCOUNTER — Encounter: Payer: Self-pay | Admitting: Adult Health

## 2024-05-30 NOTE — Progress Notes (Unsigned)
 Location:  [facility] Room Number: 148 Place of Service:  SNF (31)   CODE STATUS: ***  No Known Allergies  Chief Complaint  Patient presents with   Medical Management of Chronic Issues    HPI:    Past Medical History:  Diagnosis Date   A-fib (HCC)    a. Post-op afib after CABG 08/2012.   Acute gastric ulcer with hemorrhage    Acute respiratory failure with hypoxia (HCC)    Aphasia following cerebral infarction    CAD (coronary artery disease)    a. NSTEMI s/p stent to distal RCA 03/2000. b. Inf MI s/p emergent thrombectomy/stenting mid RCA 10/2000. c. NSTEMI s/p CABGx4 (LIMA-LAD, SVG-OM2, seq SVG-acute marginal and PD) 08/16/12 - course complicated by confusion, post-op AF, L pleural effusion with thoracentesis. d. Normal LV function by echo 08/2012.   Diverticulosis    DM with CKD    Dysphagia following cerebral infarction    Encephalopathy    Extended spectrum beta lactamase (ESBL) resistance    Generalized anxiety disorder    GERD (gastroesophageal reflux disease)    Hemiplegia and hemiparesis following cerebral infarction affecting right dominant side (HCC)    Hiatal hernia    HLD (hyperlipidemia)    HTN (hypertension)    Non-ST elevation (NSTEMI) myocardial infarction [facility])    Nontraumatic intracerebral hemorrhage in hemisphere, subcortical [facility])    Peripheral vascular disease (HCC)    a. Carotid dopplers neg 08/13/12. b. Pre-cabg ABIs - R=0.87 suggesting mild dz, L=1.29 possibly falsely elevated due to calcified vessels.   Pleural effusion    a. L pleural eff after CABG s/p thoracentesis 08/22/12.   Prostate cancer [facility])    Status post radiation treatment.   Prostatic hypertrophy    a. Hx of urinary retention, awaiting TURP.   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 Mild  MR by TEE 08/2012.    Past Surgical History:   Procedure Laterality Date   APPENDECTOMY     BACK SURGERY     CORONARY ARTERY BYPASS GRAFT  08/16/2012   Procedure: CORONARY ARTERY BYPASS GRAFTING (CABG);  Surgeon: Zelphia Higashi, MD;  Location: [facility] OR;  Service: Open Heart Surgery;  Laterality: N/A;   IR REPLC GASTRO/COLONIC TUBE PERCUT W/FLUORO  02/13/2019   LEFT HEART CATHETERIZATION WITH CORONARY ANGIOGRAM N/A 08/14/2012   Procedure: LEFT HEART CATHETERIZATION WITH CORONARY ANGIOGRAM;  Surgeon: Peter M Swaziland, MD;  Location: [facility] CATH LAB;  Service: Cardiovascular;  Laterality: N/A;    Social History   Socioeconomic History   Marital status: Married    Spouse name: Not on file   Number of children: Not on file   Years of education: Not on file   Highest education level: Not on file  Occupational History   Occupation: retired   Tobacco Use   Smoking status: Former    Current packs/day: 0.00    Types: Cigarettes    Quit date: 11/12/1972    Years since quitting: 51.5   Smokeless tobacco: Never  Vaping Use   Vaping status: Never Used  Substance and Sexual Activity   Alcohol use: No   Drug use: No   Sexual activity: Not Currently  Other Topics Concern   Not on file  Social History Narrative   He is a long term patient of [facility]  Social Drivers of Corporate investment banker Strain: Low Risk  (07/01/2019)   Overall Financial Resource Strain (CARDIA)    Difficulty of Paying Living Expenses: Not hard at all  Food Insecurity: No Food Insecurity (02/03/2021)   Received from [facility]   Hunger Vital Sign    Within the past 12 months, you worried that your food would run out before you got the money to buy more.: Never true    Within the past 12 months, the food you bought just didn't last and you didn't have money to get more.: Never true  Transportation Needs: Unknown (07/01/2019)   PRAPARE - Transportation    Lack of Transportation (Medical): Patient declined    Lack of Transportation (Non-Medical): Patient declined   Physical Activity: Inactive (10/30/2019)   Exercise Vital Sign    Days of Exercise per Week: 0 days    Minutes of Exercise per Session: 0 min  Stress: Unknown (07/01/2019)   [employer] of Occupational Health - Occupational Stress Questionnaire    Feeling of Stress : Patient declined  Social Connections: Unknown (04/25/2022)   Received from [facility]   Social Network    Social Network: Not on file  Intimate Partner Violence: Unknown (03/17/2022)   Received from [facility]   HITS    Physically Hurt: Not on file    Insult or Talk Down To: Not on file    Threaten Physical Harm: Not on file    Scream or Curse: Not on file   Family History  Problem Relation Age of Onset   Hypertension Mother       VITAL SIGNS BP 121/63   Pulse (!) 58   Temp 97.7 F (36.5 C)   Resp 20   Ht 5' 8 (1.727 m)   Wt 159 lb (72.1 kg)   SpO2 99%   BMI 24.18 kg/m   Outpatient Encounter Medications as of 05/29/2024  Medication Sig   acetaminophen  (TYLENOL ) 325 MG tablet Take 650 mg by mouth 3 (three) times daily as needed for mild pain (pain score 1-3).   amLODipine  (NORVASC ) 2.5 MG tablet Take 2.5 mg by mouth daily.   baclofen (LIORESAL) 10 MG tablet Take 5 mg by mouth 3 (three) times daily.   Balsam [place]-Castor Oil (VENELEX) OINT Apply topically.   BD AUTOSHIELD DUO 30G X 5 MM MISC 3/16   clotrimazole (LOTRIMIN) 1 % cream Apply 1 Application topically 2 (two) times daily.   Continuous Glucose Sensor (FREESTYLE LIBRE 3 SENSOR) MISC 1 Device by Does not apply route every 14 (fourteen) days. Place 1 sensor on the skin every 14 days. Use to check glucose continuously   FLUoxetine  (PROZAC ) 20 MG tablet Take 20 mg by mouth daily.   insulin  glargine (LANTUS ) 100 UNIT/ML injection Inject 16 Units into the skin daily.   insulin  lispro (HUMALOG ) 100 UNIT/ML cartridge Inject 5 Units into the skin in the morning and at bedtime. Give 5 units  with breakfast and lunch give if he eats more than 50% of  meals   loratadine (CLARITIN) 10 MG tablet Take 10 mg by mouth daily.   losartan (COZAAR) 25 MG tablet Take 25 mg by mouth daily. DX: Hypertensive heart and chronic kidney disease without heart failure, with stage 1 through stage 4 chronic kidney disease, or unspecified chronic kidney disease-per NP note, code updated   NON FORMULARY Diet: Regular; please put food in bowls for all meals   Nutritional Supplements (ENSURE ENLIVE PO) Take by mouth  daily.   rOPINIRole (REQUIP) 0.25 MG tablet Take 0.25 mg by mouth at bedtime.   rosuvastatin (CRESTOR) 10 MG tablet Take 10 mg by mouth daily.   sennosides-docusate sodium  (SENOKOT-S) 8.6-50 MG tablet Take 1 tablet by mouth in the morning and at bedtime.   No facility-administered encounter medications on file as of 05/29/2024.     SIGNIFICANT DIAGNOSTIC EXAMS       ASSESSMENT/ PLAN:     Britt Candle NP [facility] Adult Medicine  Contact [phone] Monday through Friday 8am- 5pm  After hours call [phone]

## 2024-06-07 ENCOUNTER — Non-Acute Institutional Stay (SKILLED_NURSING_FACILITY): Payer: Self-pay | Admitting: Adult Health

## 2024-06-07 ENCOUNTER — Encounter: Payer: Self-pay | Admitting: Adult Health

## 2024-06-07 DIAGNOSIS — F0150 Vascular dementia without behavioral disturbance: Secondary | ICD-10-CM

## 2024-06-07 DIAGNOSIS — E1151 Type 2 diabetes mellitus with diabetic peripheral angiopathy without gangrene: Secondary | ICD-10-CM

## 2024-06-07 DIAGNOSIS — R627 Adult failure to thrive: Secondary | ICD-10-CM

## 2024-06-07 NOTE — Progress Notes (Signed)
 Location:  [facility] Room Number: 148D Place of Service:  SNF (31)   CODE STATUS: DNR  No Known Allergies  Chief Complaint  Patient presents with   Acute Visit    Care plan meeting.     HPI:  We have come together for his care plan meeting. Family present. BIMS: no; Mood: no. He is out of bed to geri-chair daily without falls. He requires dependent assist with his adl care. He is incontinent of bladder and bowel. Dietary: D3 with food in bowl; setup for meals; appetite 1-50% weight is 159 pounds. He will continue to be followed for his chronic illnesses including:  Type 2 diabetes mellitus with peripheral vascular disease  Vascular dementia without behavioral disturbance   Adult failure to thrive syndrome  Past Medical History:  Diagnosis Date   A-fib (HCC)    a. Post-op afib after CABG 08/2012.   Acute gastric ulcer with hemorrhage    Acute respiratory failure with hypoxia (HCC)    Aphasia following cerebral infarction    CAD (coronary artery disease)    a. NSTEMI s/p stent to distal RCA 03/2000. b. Inf MI s/p emergent thrombectomy/stenting mid RCA 10/2000. c. NSTEMI s/p CABGx4 (LIMA-LAD, SVG-OM2, seq SVG-acute marginal and PD) 08/16/12 - course complicated by confusion, post-op AF, L pleural effusion with thoracentesis. d. Normal LV function by echo 08/2012.   Diverticulosis    DM with CKD    Dysphagia following cerebral infarction    Encephalopathy    Extended spectrum beta lactamase (ESBL) resistance    Generalized anxiety disorder    GERD (gastroesophageal reflux disease)    Hemiplegia and hemiparesis following cerebral infarction affecting right dominant side (HCC)    Hiatal hernia    HLD (hyperlipidemia)    HTN (hypertension)    Non-ST elevation (NSTEMI) myocardial infarction [facility])    Nontraumatic intracerebral hemorrhage in hemisphere, subcortical [facility])    Peripheral vascular disease (HCC)    a. Carotid dopplers neg 08/13/12. b. Pre-cabg ABIs -  R=0.87 suggesting mild dz, L=1.29 possibly falsely elevated due to calcified vessels.   Pleural effusion    a. L pleural eff after CABG s/p thoracentesis 08/22/12.   Prostate cancer [facility])    Status post radiation treatment.   Prostatic hypertrophy    a. Hx of urinary retention, awaiting TURP.   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 Mild  MR by TEE 08/2012.    Past Surgical History:  Procedure Laterality Date   APPENDECTOMY     BACK SURGERY     CORONARY ARTERY BYPASS GRAFT  08/16/2012   Procedure: CORONARY ARTERY BYPASS GRAFTING (CABG);  Surgeon: Elspeth JAYSON Millers, MD;  Location: [facility] OR;  Service: Open Heart Surgery;  Laterality: N/A;   IR REPLC GASTRO/COLONIC TUBE PERCUT W/FLUORO  02/13/2019   LEFT HEART CATHETERIZATION WITH CORONARY ANGIOGRAM N/A 08/14/2012   Procedure: LEFT HEART CATHETERIZATION WITH CORONARY ANGIOGRAM;  Surgeon: Peter M Swaziland, MD;  Location: [facility] CATH LAB;  Service: Cardiovascular;  Laterality: N/A;    Social History   Socioeconomic History   Marital status: Married    Spouse name: Not on file   Number of children: Not on file   Years of education: Not on file   Highest education level: Not on file  Occupational History   Occupation: retired   Tobacco  Use   Smoking status: Former    Current packs/day: 0.00    Types: Cigarettes    Quit date: 11/12/1972    Years since quitting: 51.6   Smokeless tobacco: Never  Vaping Use   Vaping status: Never Used  Substance and Sexual Activity   Alcohol use: No   Drug use: No   Sexual activity: Not Currently  Other Topics Concern   Not on file  Social History Narrative   He is a long term patient of [facility]    Social Drivers of Corporate investment banker Strain: Low Risk  (07/01/2019)   Overall Financial Resource Strain (CARDIA)    Difficulty of Paying Living Expenses: Not hard at all  Food  Insecurity: No Food Insecurity (02/03/2021)   Received from [facility]   Hunger Vital Sign    Within the past 12 months, you worried that your food would run out before you got the money to buy more.: Never true    Within the past 12 months, the food you bought just didn't last and you didn't have money to get more.: Never true  Transportation Needs: Unknown (07/01/2019)   PRAPARE - Transportation    Lack of Transportation (Medical): Patient declined    Lack of Transportation (Non-Medical): Patient declined  Physical Activity: Inactive (10/30/2019)   Exercise Vital Sign    Days of Exercise per Week: 0 days    Minutes of Exercise per Session: 0 min  Stress: Unknown (07/01/2019)   [employer] of Occupational Health - Occupational Stress Questionnaire    Feeling of Stress : Patient declined  Social Connections: Unknown (04/25/2022)   [facility] from [facility]   Social Network    Social Network: Not on file  Intimate Partner Violence: Unknown (03/17/2022)   Received from [facility]   HITS    Physically Hurt: Not on file    Insult or Talk Down To: Not on file    Threaten Physical Harm: Not on file    Scream or Curse: Not on file   Family History  Problem Relation Age of Onset   Hypertension Mother       VITAL SIGNS BP 120/84   Pulse (!) 56   Temp 97.6 F (36.4 C)   Resp 20   Wt 159 lb (72.1 kg)   SpO2 96%   BMI 24.18 kg/m   Outpatient Encounter Medications as of 06/07/2024  Medication Sig   acetaminophen  (TYLENOL ) 325 MG tablet Take 650 mg by mouth 3 (three) times daily as needed for mild pain (pain score 1-3).   amLODipine  (NORVASC ) 2.5 MG tablet Take 2.5 mg by mouth daily.   baclofen (LIORESAL) 10 MG tablet Take 5 mg by mouth 3 (three) times daily.   Balsam [place]-Castor Oil (VENELEX) OINT Apply topically.   BD AUTOSHIELD DUO 30G X 5 MM MISC 3/16   clotrimazole (LOTRIMIN) 1 % cream Apply 1 Application topically 2 (two) times daily.   FLUoxetine  (PROZAC ) 20 MG  tablet Take 20 mg by mouth daily.   insulin  glargine (LANTUS ) 100 UNIT/ML injection Inject 16 Units into the skin daily.   insulin  lispro (HUMALOG ) 100 UNIT/ML cartridge Inject 5 Units into the skin in the morning and at bedtime. Give 5 units  with breakfast and lunch give if he eats more than 50% of meals   loratadine (CLARITIN) 10 MG tablet Take 10 mg by mouth daily.   losartan (COZAAR) 25 MG tablet Take 25 mg by mouth daily. DX: Hypertensive heart  and chronic kidney disease without heart failure, with stage 1 through stage 4 chronic kidney disease, or unspecified chronic kidney disease-per NP note, code updated   NON FORMULARY Diet: Regular; please put food in bowls for all meals   Nutritional Supplements (ENSURE ENLIVE PO) Take by mouth daily.   rosuvastatin (CRESTOR) 10 MG tablet Take 10 mg by mouth daily.   sennosides-docusate sodium  (SENOKOT-S) 8.6-50 MG tablet Take 1 tablet by mouth in the morning and at bedtime.   Continuous Glucose Sensor (FREESTYLE LIBRE 3 SENSOR) MISC 1 Device by Does not apply route every 14 (fourteen) days. Place 1 sensor on the skin every 14 days. Use to check glucose continuously (Patient not taking: Reported on 06/07/2024)   rOPINIRole (REQUIP) 0.25 MG tablet Take 0.25 mg by mouth at bedtime. (Patient not taking: Reported on 06/07/2024)   No facility-administered encounter medications on file as of 06/07/2024.     SIGNIFICANT DIAGNOSTIC EXAMS  LABS REVIEWED PREVIOUS:     07-20-23: hgb A1c  6.8 10-16-23: wbc 8.8; hgb 15.9; hct 49.9; mcv 93.8 plt 176; glucose 101; bun 32; creat 1.70; k+ 3.8; na++ 137; ca 9.1 gfr 41; protein 6.5 albumin  3.1 hgb A1c 6.8; chol 106; ldl 65; trig 72; hdl 27; tsh 2.208   01-01-24: ACR 256 04-25-24: wbc 7.6; hgb 15.9; hct 49.9; mcv 93.8 plt 176; glucose 84; bun 32; creat 1.48 ;k+ 4.0; na++ 134; ca 9.2 gfr 48; protein 6.8 albumin  2.8; tsh 2.449; psa 0.96; hgb A1c 7.5; chol 166; ldl 114; trig 133; hdl 25   NO NEW LABS.   Review of Systems   Reason unable to perform ROS: aphasia.   Physical Exam Constitutional:      General: He is not in acute distress.    Appearance: He is well-developed. He is not diaphoretic.  Neck:     Thyroid : No thyromegaly.   Cardiovascular:     Rate and Rhythm: Normal rate and regular rhythm.     Pulses: Normal pulses.     Heart sounds: Normal heart sounds.  Pulmonary:     Effort: Pulmonary effort is normal. No respiratory distress.     Breath sounds: Normal breath sounds.  Abdominal:     General: Bowel sounds are normal. There is no distension.     Palpations: Abdomen is soft.     Tenderness: There is no abdominal tenderness.   Musculoskeletal:     Cervical back: Neck supple.     Right lower leg: No edema.     Left lower leg: No edema.     Comments:  Right hemiplegia   Lymphadenopathy:     Cervical: No cervical adenopathy.   Skin:    General: Skin is warm and dry.   Neurological:     Mental Status: He is alert. Mental status is at baseline.   Psychiatric:        Mood and Affect: Mood normal.      ASSESSMENT/ PLAN:  TODAY  Type 2 diabetes mellitus with peripheral vascular disease Vascular dementia without behavioral disturbance Adult failure to thrive syndrome  Will continue current medications Will continue current plan of care Will continue to monitor his status.   Time spent with patient: 40 minutes: medications; health status; dietary.    Barnie Seip NP [facility] Adult Medicine  call [phone]

## 2024-06-10 DIAGNOSIS — F331 Major depressive disorder, recurrent, moderate: Secondary | ICD-10-CM | POA: Diagnosis not present

## 2024-06-10 DIAGNOSIS — F5105 Insomnia due to other mental disorder: Secondary | ICD-10-CM | POA: Diagnosis not present

## 2024-07-02 ENCOUNTER — Non-Acute Institutional Stay (SKILLED_NURSING_FACILITY): Payer: Self-pay | Admitting: Adult Health

## 2024-07-02 ENCOUNTER — Encounter: Payer: Self-pay | Admitting: Adult Health

## 2024-07-02 DIAGNOSIS — K219 Gastro-esophageal reflux disease without esophagitis: Secondary | ICD-10-CM

## 2024-07-02 DIAGNOSIS — K5909 Other constipation: Secondary | ICD-10-CM | POA: Diagnosis not present

## 2024-07-02 DIAGNOSIS — R339 Retention of urine, unspecified: Secondary | ICD-10-CM

## 2024-07-02 NOTE — Progress Notes (Unsigned)
 Location:  [facility] Room Number: South/148/D Place of Service:  SNF (31)   CODE STATUS: DNR  No Known Allergies  Chief Complaint  Patient presents with   Medical Management of Chronic Issues    Routine Visit    HPI:    Past Medical History:  Diagnosis Date   A-fib (HCC)    a. Post-op afib after CABG 08/2012.   Acute gastric ulcer with hemorrhage    Acute respiratory failure with hypoxia (HCC)    Aphasia following cerebral infarction    CAD (coronary artery disease)    a. NSTEMI s/p stent to distal RCA 03/2000. b. Inf MI s/p emergent thrombectomy/stenting mid RCA 10/2000. c. NSTEMI s/p CABGx4 (LIMA-LAD, SVG-OM2, seq SVG-acute marginal and PD) 08/16/12 - course complicated by confusion, post-op AF, L pleural effusion with thoracentesis. d. Normal LV function by echo 08/2012.   Diverticulosis    DM with CKD    Dysphagia following cerebral infarction    Encephalopathy    Extended spectrum beta lactamase (ESBL) resistance    Generalized anxiety disorder    GERD (gastroesophageal reflux disease)    Hemiplegia and hemiparesis following cerebral infarction affecting right dominant side (HCC)    Hiatal hernia    HLD (hyperlipidemia)    HTN (hypertension)    Non-ST elevation (NSTEMI) myocardial infarction [facility])    Nontraumatic intracerebral hemorrhage in hemisphere, subcortical [facility])    Peripheral vascular disease (HCC)    a. Carotid dopplers neg 08/13/12. b. Pre-cabg ABIs - R=0.87 suggesting mild dz, L=1.29 possibly falsely elevated due to calcified vessels.   Pleural effusion    a. L pleural eff after CABG s/p thoracentesis 08/22/12.   Prostate cancer [facility])    Status post radiation treatment.   Prostatic hypertrophy    a. Hx of urinary retention, awaiting TURP.   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 Mild  MR by TEE 08/2012.     Past Surgical History:  Procedure Laterality Date   APPENDECTOMY     BACK SURGERY     CORONARY ARTERY BYPASS GRAFT  08/16/2012   Procedure: CORONARY ARTERY BYPASS GRAFTING (CABG);  Surgeon: Elspeth JAYSON Millers, MD;  Location: [facility] OR;  Service: Open Heart Surgery;  Laterality: N/A;   IR REPLC GASTRO/COLONIC TUBE PERCUT W/FLUORO  02/13/2019   LEFT HEART CATHETERIZATION WITH CORONARY ANGIOGRAM N/A 08/14/2012   Procedure: LEFT HEART CATHETERIZATION WITH CORONARY ANGIOGRAM;  Surgeon: Peter M Swaziland, MD;  Location: [facility] CATH LAB;  Service: Cardiovascular;  Laterality: N/A;    Social History   Socioeconomic History   Marital status: Married    Spouse name: Not on file   Number of children: Not on file   Years of education: Not on file   Highest education level: Not on file  Occupational History   Occupation: retired   Tobacco Use   Smoking status: Former    Current packs/day: 0.00    Types: Cigarettes    Quit date: 11/12/1972    Years since quitting: 51.6   Smokeless tobacco: Never  Vaping Use   Vaping status: Never Used  Substance and Sexual Activity   Alcohol use: No   Drug use: No   Sexual activity: Not Currently  Other Topics Concern   Not on file  Social History Narrative   He is a long term patient  of [facility]    Social Drivers of Health   Financial Resource Strain: Low Risk  (07/01/2019)   Overall Financial Resource Strain (CARDIA)    Difficulty of Paying Living Expenses: Not hard at all  Food Insecurity: No Food Insecurity (02/03/2021)   Received from [facility]   Hunger Vital Sign    Within the past 12 months, you worried that your food would run out before you got the money to buy more.: Never true    Within the past 12 months, the food you bought just didn't last and you didn't have money to get more.: Never true  Transportation Needs: Unknown (07/01/2019)   PRAPARE - Transportation    Lack of Transportation (Medical): Patient declined    Lack of Transportation  (Non-Medical): Patient declined  Physical Activity: Inactive (10/30/2019)   Exercise Vital Sign    Days of Exercise per Week: 0 days    Minutes of Exercise per Session: 0 min  Stress: Unknown (07/01/2019)   [employer] of Occupational Health - Occupational Stress Questionnaire    Feeling of Stress : Patient declined  Social Connections: Unknown (04/25/2022)   Received from [facility]   Social Network    Social Network: Not on file  Intimate Partner Violence: Unknown (03/17/2022)   Received from [facility]   HITS    Physically Hurt: Not on file    Insult or Talk Down To: Not on file    Threaten Physical Harm: Not on file    Scream or Curse: Not on file   Family History  Problem Relation Age of Onset   Hypertension Mother       VITAL SIGNS BP 114/62   Pulse 72   Temp (!) 97.3 F (36.3 C)   Resp 18   Ht 5' 8.5 (1.74 m)   Wt 162 lb (73.5 kg)   SpO2 98%   BMI 24.27 kg/m   Outpatient Encounter Medications as of 07/02/2024  Medication Sig   acetaminophen  (TYLENOL ) 325 MG tablet Take 650 mg by mouth 3 (three) times daily as needed for mild pain (pain score 1-3).   amLODipine  (NORVASC ) 2.5 MG tablet Take 2.5 mg by mouth daily.   baclofen (LIORESAL) 10 MG tablet Take 5 mg by mouth 3 (three) times daily.   Balsam [place]-Castor Oil (VENELEX) OINT Apply topically.   BD AUTOSHIELD DUO 30G X 5 MM MISC 3/16   clotrimazole (LOTRIMIN) 1 % cream Apply 1 Application topically 2 (two) times daily.   FLUoxetine  (PROZAC ) 20 MG tablet Take 20 mg by mouth daily.   insulin  glargine (LANTUS ) 100 UNIT/ML injection Inject 16 Units into the skin daily.   insulin  lispro (HUMALOG ) 100 UNIT/ML cartridge Inject 5 Units into the skin in the morning and at bedtime. Give 5 units  with breakfast and lunch give if he eats more than 50% of meals   loratadine (CLARITIN) 10 MG tablet Take 10 mg by mouth daily.   losartan (COZAAR) 25 MG tablet Take 25 mg by mouth daily. DX: Hypertensive heart and  chronic kidney disease without heart failure, with stage 1 through stage 4 chronic kidney disease, or unspecified chronic kidney disease-per NP note, code updated   NON FORMULARY Diet: Regular; please put food in bowls for all meals   Nutritional Supplements (ENSURE ENLIVE PO) Take by mouth daily.   rosuvastatin (CRESTOR) 10 MG tablet Take 10 mg by mouth daily.   sennosides-docusate sodium  (SENOKOT-S) 8.6-50 MG tablet Take 1 tablet by mouth in the morning  and at bedtime.   Continuous Glucose Sensor (FREESTYLE LIBRE 3 SENSOR) MISC 1 Device by Does not apply route every 14 (fourteen) days. Place 1 sensor on the skin every 14 days. Use to check glucose continuously (Patient not taking: Reported on 07/02/2024)   rOPINIRole (REQUIP) 0.25 MG tablet Take 0.25 mg by mouth at bedtime. (Patient not taking: Reported on 07/02/2024)   No facility-administered encounter medications on file as of 07/02/2024.     SIGNIFICANT DIAGNOSTIC EXAMS       ASSESSMENT/ PLAN:     Barnie Seip NP [facility] Adult Medicine  Contact [phone] Monday through Friday 8am- 5pm  After hours call [phone]

## 2024-07-08 DIAGNOSIS — F5105 Insomnia due to other mental disorder: Secondary | ICD-10-CM | POA: Diagnosis not present

## 2024-07-08 DIAGNOSIS — F331 Major depressive disorder, recurrent, moderate: Secondary | ICD-10-CM | POA: Diagnosis not present

## 2024-07-10 DIAGNOSIS — L602 Onychogryphosis: Secondary | ICD-10-CM | POA: Diagnosis not present

## 2024-07-10 DIAGNOSIS — E1151 Type 2 diabetes mellitus with diabetic peripheral angiopathy without gangrene: Secondary | ICD-10-CM | POA: Diagnosis not present

## 2024-07-10 DIAGNOSIS — I739 Peripheral vascular disease, unspecified: Secondary | ICD-10-CM | POA: Diagnosis not present

## 2024-07-17 ENCOUNTER — Non-Acute Institutional Stay (SKILLED_NURSING_FACILITY): Payer: Self-pay | Admitting: Internal Medicine

## 2024-07-17 ENCOUNTER — Encounter: Payer: Self-pay | Admitting: Internal Medicine

## 2024-07-17 DIAGNOSIS — E43 Unspecified severe protein-calorie malnutrition: Secondary | ICD-10-CM

## 2024-07-17 DIAGNOSIS — N1832 Chronic kidney disease, stage 3b: Secondary | ICD-10-CM

## 2024-07-17 DIAGNOSIS — I4891 Unspecified atrial fibrillation: Secondary | ICD-10-CM

## 2024-07-17 DIAGNOSIS — E782 Mixed hyperlipidemia: Secondary | ICD-10-CM | POA: Diagnosis not present

## 2024-07-17 DIAGNOSIS — E1151 Type 2 diabetes mellitus with diabetic peripheral angiopathy without gangrene: Secondary | ICD-10-CM | POA: Diagnosis not present

## 2024-07-17 NOTE — Assessment & Plan Note (Signed)
 04/25/24 LDL 114 ; low dose Crestor 10 mg Rxed in June. Recheck lipids in September.

## 2024-07-17 NOTE — Assessment & Plan Note (Signed)
 A1c 7.5%, up from 6.8%. Om ac insulin  bid & 16 u basal. Update A1c next month.

## 2024-07-17 NOTE — Assessment & Plan Note (Signed)
 CKD serially improved to low Stage 3A (1.48/48 with prior values 1.70/41). Med list reviewed ; no change indicated.

## 2024-07-17 NOTE — Patient Instructions (Signed)
 See assessment and plan under each diagnosis in the problem list and acutely for this visit

## 2024-07-17 NOTE — Assessment & Plan Note (Signed)
 Rate well controlled; rhythm minimally irregular. No change indicated.

## 2024-07-17 NOTE — Progress Notes (Signed)
   NURSING HOME LOCATION:  [facility] ROOM NUMBER:  [id number]  CODE STATUS:  DNR  PCP: Landy Barnie RAMAN, NP   This is a nursing facility follow up visit  of chronic medical diagnoses to document compliance with Regulation 483.30 (c) in The Long Term Care Survey Manual Phase 2 which mandates caregiver visit ( visits can alternate among physician, PA or NP as per statutes) within 10 days of 30 days / 60 days/ 90 days post admission to SNF date  .  Interim medical record and care since last SNF visit was updated with review of diagnostic studies and change in clinical status since last visit were documented.  HPI:He is a permanent resident of this facility with medical diagnoses of AF; protein caloric malnutition ; dyslipidemia; CAD ,S/P CABG;HTN ; PMH CVA with aphasia & R hemiplegia;DM with PVD; PMH prostate CA; CKD Stage 3; & adult failure to thrive.  Review of systems could not be completed as he was completely non verbal & would not follow commands.  Physical exam:  Pertinent or positive findings:Pattern alopecia is present.Facies are blank. Polyp present OD lateral eyebrow & small eschar above OS medially.Upper incisors missing.Rate slow but rhythm slightly irregular. Grade 1/2 systolic murmur . Questionable faint carotid bruits suggested.He would not take deep breaths to assess breath sounds. Pedal pulses are decreased. Profound lower limb atrophy.He would not oppose to assess strength. R hand kept fisted.  General appearance:  no acute distress, increased work of breathing is present.   Lymphatic: No lymphadenopathy about the head, neck, axilla. Eyes: No conjunctival inflammation or lid edema is present. There is no scleral icterus. Ears:  External ear exam shows no significant lesions or deformities.   Nose:  External nasal examination shows no deformity or inflammation. Nasal mucosa are pink and moist without lesions, exudates Neck:  No thyromegaly, masses, tenderness  noted.    Heart:  No gallop, click, rub .  Lungs:  without wheezes, rhonchi, rales, rubs. Abdomen: Bowel sounds are normal. Abdomen is soft and nontender with no organomegaly, hernias, masses. GU: Deferred  Extremities:  No cyanosis, clubbing, edema  Neurologic exam :Balance, Rhomberg, finger to nose testing could not be completed due to clinical state Skin: Warm & dry w/o tenting. No significant rash.  See summary under each active problem in the Problem List with associated updated therapeutic plan

## 2024-07-17 NOTE — Assessment & Plan Note (Signed)
 Albumin  2.8, down from 3.1; but total protein 6.8 , up from 6.5. Severe BLE atrophy.Nutrition follows.

## 2024-07-24 DIAGNOSIS — F331 Major depressive disorder, recurrent, moderate: Secondary | ICD-10-CM | POA: Diagnosis not present

## 2024-07-24 DIAGNOSIS — F5105 Insomnia due to other mental disorder: Secondary | ICD-10-CM | POA: Diagnosis not present

## 2024-08-13 ENCOUNTER — Non-Acute Institutional Stay (SKILLED_NURSING_FACILITY): Payer: Self-pay | Admitting: Adult Health

## 2024-08-13 ENCOUNTER — Encounter: Payer: Self-pay | Admitting: Adult Health

## 2024-08-13 DIAGNOSIS — J3089 Other allergic rhinitis: Secondary | ICD-10-CM

## 2024-08-13 DIAGNOSIS — E1122 Type 2 diabetes mellitus with diabetic chronic kidney disease: Secondary | ICD-10-CM

## 2024-08-13 DIAGNOSIS — N183 Chronic kidney disease, stage 3 unspecified: Secondary | ICD-10-CM

## 2024-08-13 DIAGNOSIS — E1151 Type 2 diabetes mellitus with diabetic peripheral angiopathy without gangrene: Secondary | ICD-10-CM | POA: Diagnosis not present

## 2024-08-13 NOTE — Progress Notes (Signed)
 Location:  [facility] Room Number: 147 Place of Service:  SNF ([id number])   CODE STATUS: DNR   No Known Allergies  Chief Complaint  Patient presents with   Medical Management of Chronic Issues         Chronic non-seasonal allergic rhinitis: CKD stage 3 due to type 2 diabetes mellitus:  Type 2 diabetes mellitus with peripheral vascular disease:    HPI:  He is a 78 y.o. long term resident of this facility being seen for the management of his chronic illnesses:Chronic non-seasonal allergic rhinitis: CKD stage 3 due to type 2 diabetes mellitus:  Type 2 diabetes mellitus with peripheral vascular disease. There are no reports of uncontrolled pain. His weight is without significant change. He continues to get out of bed daily to geri-chair.    Past Medical History:  Diagnosis Date   A-fib [facility])    a. Post-op afib after CABG 08/2012.   Acute gastric ulcer with hemorrhage    Acute respiratory failure with hypoxia (HCC)    Aphasia following cerebral infarction    CAD (coronary artery disease)    a. NSTEMI s/p stent to distal RCA 03/2000. b. Inf MI s/p emergent thrombectomy/stenting mid RCA 10/2000. c. NSTEMI s/p CABGx4 (LIMA-LAD, SVG-OM2, seq SVG-acute marginal and PD) 08/16/12 - course complicated by confusion, post-op AF, L pleural effusion with thoracentesis. d. Normal LV function by echo 08/2012.   Diverticulosis    DM with CKD    Dysphagia following cerebral infarction    Encephalopathy    Extended spectrum beta lactamase (ESBL) resistance    Generalized anxiety disorder    GERD (gastroesophageal reflux disease)    Hemiplegia and hemiparesis following cerebral infarction affecting right dominant side (HCC)    Hiatal hernia    HLD (hyperlipidemia)    HTN (hypertension)    Non-ST elevation (NSTEMI) myocardial infarction [facility])    Nontraumatic intracerebral hemorrhage in hemisphere, subcortical [facility])    Peripheral vascular disease (HCC)    a. Carotid dopplers neg  08/13/12. b. Pre-cabg ABIs - R=0.87 suggesting mild dz, L=1.29 possibly falsely elevated due to calcified vessels.   Pleural effusion    a. L pleural eff after CABG s/p thoracentesis 08/22/12.   Prostate cancer [facility])    Status post radiation treatment.   Prostatic hypertrophy    a. Hx of urinary retention, awaiting TURP.   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 Mild  MR by TEE 08/2012.    Past Surgical History:  Procedure Laterality Date   APPENDECTOMY     BACK SURGERY     CORONARY ARTERY BYPASS GRAFT  08/16/2012   Procedure: CORONARY ARTERY BYPASS GRAFTING (CABG);  Surgeon: Elspeth JAYSON Millers, MD;  Location: [facility] OR;  Service: Open Heart Surgery;  Laterality: N/A;   IR REPLC GASTRO/COLONIC TUBE PERCUT W/FLUORO  02/13/2019   LEFT HEART CATHETERIZATION WITH CORONARY ANGIOGRAM N/A 08/14/2012   Procedure: LEFT HEART CATHETERIZATION WITH CORONARY ANGIOGRAM;  Surgeon: Peter M Swaziland, MD;  Location: [facility] CATH LAB;  Service: Cardiovascular;  Laterality: N/A;    Social History   Socioeconomic History   Marital status: Married    Spouse name: Not on file   Number of children: Not on file   Years of education: Not on file   Highest education level: Not on file  Occupational History  Occupation: retired   Tobacco Use   Smoking status: Former    Current packs/day: 0.00    Types: Cigarettes    Quit date: 11/12/1972    Years since quitting: 51.8   Smokeless tobacco: Never  Vaping Use   Vaping status: Never Used  Substance and Sexual Activity   Alcohol use: No   Drug use: No   Sexual activity: Not Currently  Other Topics Concern   Not on file  Social History Narrative   He is a long term patient of [facility]    Social Drivers of Health   Financial Resource Strain: Low Risk  (07/01/2019)   Overall Financial Resource Strain (CARDIA)    Difficulty of Paying Living  Expenses: Not hard at all  Food Insecurity: No Food Insecurity (02/03/2021)   Received from [facility]   Hunger Vital Sign    Within the past 12 months, you worried that your food would run out before you got the money to buy more.: Never true    Within the past 12 months, the food you bought just didn't last and you didn't have money to get more.: Never true  Transportation Needs: Unknown (07/01/2019)   PRAPARE - Transportation    Lack of Transportation (Medical): Patient declined    Lack of Transportation (Non-Medical): Patient declined  Physical Activity: Inactive (10/30/2019)   Exercise Vital Sign    Days of Exercise per Week: 0 days    Minutes of Exercise per Session: 0 min  Stress: Unknown (07/01/2019)   [employer] of Occupational Health - Occupational Stress Questionnaire    Feeling of Stress : Patient declined  Social Connections: Unknown (04/25/2022)   Received from [facility]   Social Network    Social Network: Not on file  Intimate Partner Violence: Unknown (03/17/2022)   Received from [facility]   HITS    Physically Hurt: Not on file    Insult or Talk Down To: Not on file    Threaten Physical Harm: Not on file    Scream or Curse: Not on file   Family History  Problem Relation Age of Onset   Hypertension Mother       VITAL SIGNS BP 105/63   Pulse 70   Temp 97.6 F (36.4 C)   Resp 18   Ht 5' 8.5 (1.74 m)   Wt 165 lb 8 oz (75.1 kg)   SpO2 97%   BMI 24.80 kg/m   Outpatient Encounter Medications as of 08/13/2024  Medication Sig   acetaminophen  (TYLENOL ) 325 MG tablet Take 650 mg by mouth 3 (three) times daily as needed for mild pain (pain score 1-3). (Patient taking differently: Take 650 mg by mouth 2 (two) times daily. oral, Twice A Day, for generalized pain. Max 3gm acetaminophen /24 hrs from all sources.)   amLODipine  (NORVASC ) 2.5 MG tablet Take 2.5 mg by mouth daily.   baclofen (LIORESAL) 10 MG tablet Take 5 mg by mouth 3 (three) times daily.  (Patient taking differently: Take 5 mg by mouth 4 (four) times daily.)   Balsam [place]-Castor Oil (VENELEX) OINT Apply topically.   BD AUTOSHIELD DUO 30G X 5 MM MISC 3/16   clotrimazole (LOTRIMIN) 1 % cream Apply 1 Application topically 2 (two) times daily.   Continuous Glucose Sensor (FREESTYLE LIBRE 3 SENSOR) MISC 1 Device by Does not apply route every 14 (fourteen) days. Place 1 sensor on the skin every 14 days. Use to check glucose continuously   Eyelid Cleansers (OCUSOFT LID SCRUB ORIGINAL)  PADS Apply topically as needed. 1 pad, topical, As Needed, bid to eyes as needed to clean   FLUoxetine  (PROZAC ) 20 MG tablet Take 20 mg by mouth daily. (Patient taking differently: Take 10 mg by mouth daily.)   insulin  glargine (LANTUS ) 100 UNIT/ML injection Inject 16 Units into the skin daily.   insulin  lispro (HUMALOG ) 100 UNIT/ML cartridge Inject 5 Units into the skin in the morning and at bedtime. Give 5 units  with breakfast and lunch give if he eats more than 50% of meals   loratadine (CLARITIN) 10 MG tablet Take 10 mg by mouth daily.   losartan (COZAAR) 25 MG tablet Take 25 mg by mouth daily. DX: Hypertensive heart and chronic kidney disease without heart failure, with stage 1 through stage 4 chronic kidney disease, or unspecified chronic kidney disease-per NP note, code updated   NON FORMULARY Diet: Regular; please put food in bowls for all meals   Nutritional Supplements (ENSURE ENLIVE PO) Take by mouth daily.   rosuvastatin (CRESTOR) 10 MG tablet Take 10 mg by mouth daily.   sennosides-docusate sodium  (SENOKOT-S) 8.6-50 MG tablet Take 1 tablet by mouth in the morning and at bedtime.   rOPINIRole (REQUIP) 0.25 MG tablet Take 0.25 mg by mouth at bedtime. (Patient not taking: Reported on 08/13/2024)   No facility-administered encounter medications on file as of 08/13/2024.     SIGNIFICANT DIAGNOSTIC EXAMS  LABS REVIEWED PREVIOUS:     10-16-23: wbc 8.8; hgb 15.9; hct 49.9; mcv 93.8 plt 176; glucose  101; bun 32; creat 1.70; k+ 3.8; na++ 137; ca 9.1 gfr 41; protein 6.5 albumin  3.1 hgb A1c 6.8; chol 106; ldl 65; trig 72; hdl 27; tsh 2.208   01-01-24: ACR 256 04-25-24: wbc 7.6; hgb 15.9; hct 49.9; mcv 93.8 plt 176; glucose 84; bun 32; creat 1.48 ;k+ 4.0; na++ 134; ca 9.2 gfr 48; protein 6.8 albumin  2.8; tsh 2.449; psa 0.96; hgb A1c 7.5; chol 166; ldl 114; trig 133; hdl 25   NO NEW LABS.   Review of Systems  Reason unable to perform ROS: aphasia.    Physical Exam Constitutional:      General: He is not in acute distress.    Appearance: He is well-developed. He is not diaphoretic.  Neck:     Thyroid : No thyromegaly.  Cardiovascular:     Rate and Rhythm: Normal rate and regular rhythm.     Pulses: Normal pulses.     Heart sounds: Normal heart sounds.  Pulmonary:     Effort: Pulmonary effort is normal. No respiratory distress.     Breath sounds: Normal breath sounds.  Abdominal:     General: Bowel sounds are normal. There is no distension.     Palpations: Abdomen is soft.     Tenderness: There is no abdominal tenderness.  Musculoskeletal:     Cervical back: Neck supple.     Right lower leg: No edema.     Left lower leg: No edema.     Comments: Right hemiplegia    Lymphadenopathy:     Cervical: No cervical adenopathy.  Skin:    General: Skin is warm and dry.  Neurological:     Mental Status: He is alert. Mental status is at baseline.  Psychiatric:        Mood and Affect: Mood normal.       ASSESSMENT/ PLAN:  TODAY:   Chronic non-seasonal allergic rhinitis: will continue claritin 10 mg daily   2. CKD stage 3 due to type  2 diabetes mellitus: bun 32; creat 1.48; gfr 48  3. Type 2 diabetes mellitus with peripheral vascular disease: hgb A1c 7.5 will continue humalog  5 units with meals twice daily; lantus  20 units nightly is on statin and arb   PREVIOUS     4. Protein calorie malnutrition, severe; albumin  2.8 will continue supplements as directed.    5. History of  nontraumatic subcortical hemorrhage left cerebral hemisphere/right spastic hemiplegia  (05-22-21) is on baclofen 5 mg three times daily for spasticity   6. Restless leg syndrome; is not on medications at this time  7. Chronic generalized pain: will continue tylenol  650 mg twice daily   8. Aortic atherosclerosis (ct 01-04-19) is on statin  9. CAD native heart without angina: s/p cabg 2012 on cozaar 25 mg daily   10. Hypertension associated with type 2 diabetes mellitus: b/p 105/63 :will continue cozaar 25 mg daily and norvasc  5 mg daily   11. History of DVT: has completed eliquis  therapy  12. Osteopenia: t scoe -1.421 is on supplements  13. Dyslipidemia associated with type 2 diabetes mellitus: LDL 114 will continue crestor 10 mg daily   14. Micro-albuminuria due to type 2 diabetes mellitus: 382.3 is on ARB .  15. Major depression with psychotic features: is taking prozac  20 mg daily   16. Chronic constipation: is on senna s twice daily   17. Chronic urine retention/bladder spasms: foley is out; will monitor   18. GERD without esophagitis: is now off PPI      Barnie Seip NP [facility] Adult [facility]  call [phone]

## 2024-08-27 DIAGNOSIS — F5105 Insomnia due to other mental disorder: Secondary | ICD-10-CM | POA: Diagnosis not present

## 2024-08-27 DIAGNOSIS — F331 Major depressive disorder, recurrent, moderate: Secondary | ICD-10-CM | POA: Diagnosis not present

## 2024-09-06 ENCOUNTER — Non-Acute Institutional Stay (SKILLED_NURSING_FACILITY): Payer: Self-pay | Admitting: Adult Health

## 2024-09-06 ENCOUNTER — Encounter: Payer: Self-pay | Admitting: Adult Health

## 2024-09-06 DIAGNOSIS — F0150 Vascular dementia without behavioral disturbance: Secondary | ICD-10-CM

## 2024-09-06 DIAGNOSIS — K219 Gastro-esophageal reflux disease without esophagitis: Secondary | ICD-10-CM

## 2024-09-06 DIAGNOSIS — E1151 Type 2 diabetes mellitus with diabetic peripheral angiopathy without gangrene: Secondary | ICD-10-CM | POA: Diagnosis not present

## 2024-09-06 NOTE — Progress Notes (Signed)
 Location:  [facility] Room Number: 148-D Place of Service:  SNF (31)   CODE STATUS: DNR  No Known Allergies  Chief Complaint  Patient presents with   Care Plan Meeting     HPI:  We have come together for his care plan meeting. Family present. No BIMS; mood 0/30.  He uses geri-chair without falls. He requires dependent assist with his adl care. He is incontinent of bladder and bowel. Dietary: D3 thin liquids weight 164 pounds; setup for meals appetite 1-100%; on ensure twice daily. Therapy: none at this time. Activities: in room; music; court yard. He will continue to be followed for his chronic illnesses including:  Type 2 diabetes mellitus with peripheral vascular disease   Vascular dementia without behavioral disturbance   GERD without esophagitis  Past Medical History:  Diagnosis Date   A-fib (HCC)    a. Post-op afib after CABG 08/2012.   Acute gastric ulcer with hemorrhage    Acute respiratory failure with hypoxia (HCC)    Aphasia following cerebral infarction    CAD (coronary artery disease)    a. NSTEMI s/p stent to distal RCA 03/2000. b. Inf MI s/p emergent thrombectomy/stenting mid RCA 10/2000. c. NSTEMI s/p CABGx4 (LIMA-LAD, SVG-OM2, seq SVG-acute marginal and PD) 08/16/12 - course complicated by confusion, post-op AF, L pleural effusion with thoracentesis. d. Normal LV function by echo 08/2012.   Diverticulosis    DM with CKD    Dysphagia following cerebral infarction    Encephalopathy    Extended spectrum beta lactamase (ESBL) resistance    Generalized anxiety disorder    GERD (gastroesophageal reflux disease)    Hemiplegia and hemiparesis following cerebral infarction affecting right dominant side (HCC)    Hiatal hernia    HLD (hyperlipidemia)    HTN (hypertension)    Non-ST elevation (NSTEMI) myocardial infarction [facility])    Nontraumatic intracerebral hemorrhage in hemisphere, subcortical (HCC)    Peripheral vascular disease    a. Carotid  dopplers neg 08/13/12. b. Pre-cabg ABIs - R=0.87 suggesting mild dz, L=1.29 possibly falsely elevated due to calcified vessels.   Pleural effusion    a. L pleural eff after CABG s/p thoracentesis 08/22/12.   Prostate cancer [facility])    Status post radiation treatment.   Prostatic hypertrophy    a. Hx of urinary retention, awaiting TURP.   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 Mild  MR by TEE 08/2012.    Past Surgical History:  Procedure Laterality Date   APPENDECTOMY     BACK SURGERY     CORONARY ARTERY BYPASS GRAFT  08/16/2012   Procedure: CORONARY ARTERY BYPASS GRAFTING (CABG);  Surgeon: Elspeth JAYSON Millers, MD;  Location: [facility] OR;  Service: Open Heart Surgery;  Laterality: N/A;   IR REPLC GASTRO/COLONIC TUBE PERCUT W/FLUORO  02/13/2019   LEFT HEART CATHETERIZATION WITH CORONARY ANGIOGRAM N/A 08/14/2012   Procedure: LEFT HEART CATHETERIZATION WITH CORONARY ANGIOGRAM;  Surgeon: Peter M Swaziland, MD;  Location: [facility] CATH LAB;  Service: Cardiovascular;  Laterality: N/A;    Social History   Socioeconomic History   Marital status: Married    Spouse name: Not on file   Number of children: Not on file   Years of education: Not on file   Highest education level: Not on file  Occupational History   Occupation: retired   Tobacco  Use   Smoking status: Former    Current packs/day: 0.00    Types: Cigarettes    Quit date: 11/12/1972    Years since quitting: 51.8   Smokeless tobacco: Never  Vaping Use   Vaping status: Never Used  Substance and Sexual Activity   Alcohol use: No   Drug use: No   Sexual activity: Not Currently  Other Topics Concern   Not on file  Social History Narrative   He is a long term patient of [facility]    Social Drivers of Corporate investment banker Strain: Low Risk  (07/01/2019)   Overall Financial Resource Strain (CARDIA)    Difficulty of Paying Living  Expenses: Not hard at all  Food Insecurity: No Food Insecurity (02/03/2021)   Received [facility]   Hunger Vital Sign    Within the past 12 months, you worried that your food would run out before you got the money to buy more.: Never true    Within the past 12 months, the food you bought just didn't last and you didn't have money to get more.: Never true  Transportation Needs: Unknown (07/01/2019)   PRAPARE - Transportation    Lack of Transportation (Medical): Patient declined    Lack of Transportation (Non-Medical): Patient declined  Physical Activity: Inactive (10/30/2019)   Exercise Vital Sign    Days of Exercise per Week: 0 days    Minutes of Exercise per Session: 0 min  Stress: Unknown (07/01/2019)   [employer] of Occupational Health - Occupational Stress Questionnaire    Feeling of Stress : Patient declined  Social Connections: Unknown (04/25/2022)   [facility] from [facility]   Social Network    Social Network: Not on file  Intimate Partner Violence: Unknown (03/17/2022)   Received from [facility]   HITS    Physically Hurt: Not on file    Insult or Talk Down To: Not on file    Threaten Physical Harm: Not on file    Scream or Curse: Not on file   Family History  Problem Relation Age of Onset   Hypertension Mother       VITAL SIGNS BP 131/67   Pulse 78   Ht 5' 8.5 (1.74 m)   Wt 164 lb (74.4 kg)   SpO2 96%   BMI 24.57 kg/m   Outpatient Encounter Medications as of 09/06/2024  Medication Sig   acetaminophen  (TYLENOL ) 325 MG tablet Take 650 mg by mouth 2 (two) times daily.   amLODipine  (NORVASC ) 2.5 MG tablet Take 2.5 mg by mouth daily.   baclofen (LIORESAL) 10 MG tablet Take 5 mg by mouth 4 (four) times daily.   Balsam [place]-Castor Oil (VENELEX) OINT Apply topically.   clotrimazole (LOTRIMIN) 1 % cream Apply 1 Application topically as directed.  every shift as needed to head od penis As Needed; PRN 1, PRN 2, PRN 3   Continuous Glucose Sensor  (FREESTYLE LIBRE 3 SENSOR) MISC 1 Device by Does not apply route every 14 (fourteen) days. Place 1 sensor on the skin every 14 days. Use to check glucose continuously   Eyelid Cleansers (OCUSOFT LID SCRUB ORIGINAL) PADS Apply topically as needed. 1 pad, topical, As Needed, bid to eyes as needed to clean   FLUoxetine  (PROZAC ) 10 MG capsule Take 10 mg by mouth daily.   insulin  glargine (LANTUS ) 100 UNIT/ML injection Inject 16 Units into the skin daily.   insulin  lispro (HUMALOG ) 100 UNIT/ML cartridge Inject 5 Units into the skin in  the morning and at bedtime. Give 5 units  with breakfast and lunch give if he eats more than 50% of meals   loratadine (CLARITIN) 10 MG tablet Take 10 mg by mouth daily.   losartan (COZAAR) 25 MG tablet Take 25 mg by mouth daily. DX: Hypertensive heart and chronic kidney disease without heart failure, with stage 1 through stage 4 chronic kidney disease, or unspecified chronic kidney disease-per NP note, code updated   Nutritional Supplements (ENSURE ENLIVE PO) Take 1 Can by mouth 2 (two) times daily.   rosuvastatin (CRESTOR) 10 MG tablet Take 10 mg by mouth daily.   sennosides-docusate sodium  (SENOKOT-S) 8.6-50 MG tablet Take 1 tablet by mouth in the morning and at bedtime.   UNABLE TO FIND Med Name: Magic Cup three times daily with meals   BD AUTOSHIELD DUO 30G X 5 MM MISC 3/16   FLUoxetine  (PROZAC ) 20 MG tablet Take 20 mg by mouth daily. (Patient not taking: Reported on 09/06/2024)   NON FORMULARY DIET: DYSPHAGIA 3 / THIN LIQUIDS. May have a baked sweet potato. FOOD IN BOWLS.  TEA FOR LUNCH. MILK FOR DINNER. Add sandwich and banana to lunch and dinner trays- Does not need to be cut up in fours. Special Instructions: NO STRAWS [DX: Dysphagia, oropharyngeal phase]  No pasta per family request   rOPINIRole (REQUIP) 0.25 MG tablet Take 0.25 mg by mouth at bedtime. (Patient not taking: Reported on 09/06/2024)   No facility-administered encounter medications on file as of  09/06/2024.     SIGNIFICANT DIAGNOSTIC EXAMS  LABS REVIEWED PREVIOUS:     10-16-23: wbc 8.8; hgb 15.9; hct 49.9; mcv 93.8 plt 176; glucose 101; bun 32; creat 1.70; k+ 3.8; na++ 137; ca 9.1 gfr 41; protein 6.5 albumin  3.1 hgb A1c 6.8; chol 106; ldl 65; trig 72; hdl 27; tsh 2.208   01-01-24: ACR 256 04-25-24: wbc 7.6; hgb 15.9; hct 49.9; mcv 93.8 plt 176; glucose 84; bun 32; creat 1.48 ;k+ 4.0; na++ 134; ca 9.2 gfr 48; protein 6.8 albumin  2.8; tsh 2.449; psa 0.96; hgb A1c 7.5; chol 166; ldl 114; trig 133; hdl 25   NO NEW LABS.   Review of Systems  Reason unable to perform ROS: aphasia.    Physical Exam Constitutional:      General: He is not in acute distress.    Appearance: He is well-developed. He is not diaphoretic.  Neck:     Thyroid : No thyromegaly.  Cardiovascular:     Rate and Rhythm: Normal rate and regular rhythm.     Heart sounds: Normal heart sounds.  Pulmonary:     Effort: Pulmonary effort is normal. No respiratory distress.     Breath sounds: Normal breath sounds.  Abdominal:     General: Bowel sounds are normal. There is no distension.     Palpations: Abdomen is soft.     Tenderness: There is no abdominal tenderness.  Musculoskeletal:     Cervical back: Neck supple.     Right lower leg: No edema.     Left lower leg: No edema.     Comments: Right hemiplegia     Lymphadenopathy:     Cervical: No cervical adenopathy.  Skin:    General: Skin is warm and dry.  Neurological:     Mental Status: He is alert. Mental status is at baseline.  Psychiatric:        Mood and Affect: Mood normal.       ASSESSMENT/ PLAN:  TODAY  Type 2  diabetes mellitus with peripheral vascular disease Vascular dementia without behavioral disturbance GERD without esophagitis  Will continue current medications Will have ST see him for choking on water  Will continue to monitor his status.   Time spent with patient: 40 minutes: medications; dietary; activities.    Barnie Seip  NP [facility] Adult Medicine  call [phone]

## 2024-09-23 ENCOUNTER — Encounter: Payer: Self-pay | Admitting: Adult Health

## 2024-09-23 ENCOUNTER — Non-Acute Institutional Stay (SKILLED_NURSING_FACILITY): Payer: Self-pay | Admitting: Adult Health

## 2024-09-23 DIAGNOSIS — E43 Unspecified severe protein-calorie malnutrition: Secondary | ICD-10-CM

## 2024-09-23 DIAGNOSIS — I6930 Unspecified sequelae of cerebral infarction: Secondary | ICD-10-CM

## 2024-09-23 DIAGNOSIS — G8111 Spastic hemiplegia affecting right dominant side: Secondary | ICD-10-CM

## 2024-09-23 DIAGNOSIS — G2581 Restless legs syndrome: Secondary | ICD-10-CM | POA: Diagnosis not present

## 2024-09-23 NOTE — Progress Notes (Signed)
 Location:  [facility] Room Number: 147 Place of Service:  SNF (31)   CODE STATUS: dnr   No Known Allergies  Chief Complaint  Patient presents with   Medical Management of Chronic Issues                Protein calorie malnutrition severe: History of nontraumatic subcortical hemorrhage of left cerebral hemisphere/right spastic hemiplegia Restless leg syndrome    HPI:  He is a 78 y.o. long term resident of this facility being seen for the management of his chronic illnesses: Protein calorie malnutrition severe: History of nontraumatic subcortical hemorrhage of left cerebral hemisphere/right spastic hemiplegia Restless leg syndrome. He continues to get out of bed daily. He does have periods of insomnia. There are no reports of uncontrolled pain.    Past Medical History:  Diagnosis Date   A-fib [facility])    a. Post-op afib after CABG 08/2012.   Acute gastric ulcer with hemorrhage    Acute respiratory failure with hypoxia (HCC)    Aphasia following cerebral infarction    CAD (coronary artery disease)    a. NSTEMI s/p stent to distal RCA 03/2000. b. Inf MI s/p emergent thrombectomy/stenting mid RCA 10/2000. c. NSTEMI s/p CABGx4 (LIMA-LAD, SVG-OM2, seq SVG-acute marginal and PD) 08/16/12 - course complicated by confusion, post-op AF, L pleural effusion with thoracentesis. d. Normal LV function by echo 08/2012.   Diverticulosis    DM with CKD    Dysphagia following cerebral infarction    Encephalopathy    Extended spectrum beta lactamase (ESBL) resistance    Generalized anxiety disorder    GERD (gastroesophageal reflux disease)    Hemiplegia and hemiparesis following cerebral infarction affecting right dominant side (HCC)    Hiatal hernia    HLD (hyperlipidemia)    HTN (hypertension)    Non-ST elevation (NSTEMI) myocardial infarction [facility])    Nontraumatic intracerebral hemorrhage in hemisphere, subcortical (HCC)    Peripheral vascular disease    a. Carotid dopplers  neg 08/13/12. b. Pre-cabg ABIs - R=0.87 suggesting mild dz, L=1.29 possibly falsely elevated due to calcified vessels.   Pleural effusion    a. L pleural eff after CABG s/p thoracentesis 08/22/12.   Prostate cancer [facility])    Status post radiation treatment.   Prostatic hypertrophy    a. Hx of urinary retention, awaiting TURP.   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 Mild  MR by TEE 08/2012.    Past Surgical History:  Procedure Laterality Date   APPENDECTOMY     BACK SURGERY     CORONARY ARTERY BYPASS GRAFT  08/16/2012   Procedure: CORONARY ARTERY BYPASS GRAFTING (CABG);  Surgeon: Elspeth JAYSON Millers, MD;  Location: [facility] OR;  Service: Open Heart Surgery;  Laterality: N/A;   IR REPLC GASTRO/COLONIC TUBE PERCUT W/FLUORO  02/13/2019   LEFT HEART CATHETERIZATION WITH CORONARY ANGIOGRAM N/A 08/14/2012   Procedure: LEFT HEART CATHETERIZATION WITH CORONARY ANGIOGRAM;  Surgeon: Peter M Swaziland, MD;  Location: [facility] CATH LAB;  Service: Cardiovascular;  Laterality: N/A;    Social History   Socioeconomic History   Marital status: Married    Spouse name: Not on file   Number of children: Not on file   Years of education: Not on file   Highest education level: Not on file  Occupational History   Occupation: retired  Tobacco Use   Smoking status: Former    Current packs/day: 0.00    Types: Cigarettes    Quit date: 11/12/1972    Years since quitting: 51.8   Smokeless tobacco: Never  Vaping Use   Vaping status: Never Used  Substance and Sexual Activity   Alcohol use: No   Drug use: No   Sexual activity: Not Currently  Other Topics Concern   Not on file  Social History Narrative   He is a long term patient of [facility]    Social Drivers of Corporate investment banker Strain: Low Risk  (07/01/2019)   Overall Financial Resource Strain (CARDIA)    Difficulty of Paying Living Expenses:  Not hard at all  Food Insecurity: No Food Insecurity (02/03/2021)   Received from [facility]   Hunger Vital Sign    Within the past 12 months, you worried that your food would run out before you got the money to buy more.: Never true    Within the past 12 months, the food you bought just didn't last and you didn't have money to get more.: Never true  Transportation Needs: Unknown (07/01/2019)   PRAPARE - Transportation    Lack of Transportation (Medical): Patient declined    Lack of Transportation (Non-Medical): Patient declined  Physical Activity: Inactive (10/30/2019)   Exercise Vital Sign    Days of Exercise per Week: 0 days    Minutes of Exercise per Session: 0 min  Stress: Unknown (07/01/2019)   [employer] of Occupational Health - Occupational Stress Questionnaire    Feeling of Stress : Patient declined  Social Connections: Unknown (04/25/2022)   Received from [facility]   Social Network    Social Network: Not on file  Intimate Partner Violence: Unknown (03/17/2022)   Received from [facility]   HITS    Physically Hurt: Not on file    Insult or Talk Down To: Not on file    Threaten Physical Harm: Not on file    Scream or Curse: Not on file   Family History  Problem Relation Age of Onset   Hypertension Mother       VITAL SIGNS BP 124/68   Pulse (!) 58   Temp 98.3 F (36.8 C)   Resp 18   Ht 5' 8.5 (1.74 m)   Wt 165 lb 9.6 oz (75.1 kg)   SpO2 98%   BMI 24.81 kg/m   Outpatient Encounter Medications as of 09/23/2024  Medication Sig   acetaminophen  (TYLENOL ) 325 MG tablet Take 650 mg by mouth 2 (two) times daily.   amLODipine  (NORVASC ) 2.5 MG tablet Take 2.5 mg by mouth daily.   baclofen (LIORESAL) 10 MG tablet Take 5 mg by mouth 4 (four) times daily.   Balsam [place]-Castor Oil (VENELEX) OINT Apply topically.   BD AUTOSHIELD DUO 30G X 5 MM MISC 3/16   clotrimazole (LOTRIMIN) 1 % cream Apply 1 Application topically as directed.  every shift as needed to  head od penis As Needed; PRN 1, PRN 2, PRN 3   Continuous Glucose Sensor (FREESTYLE LIBRE 3 SENSOR) MISC 1 Device by Does not apply route every 14 (fourteen) days. Place 1 sensor on the skin every 14 days. Use to check glucose continuously   Eyelid Cleansers (OCUSOFT LID SCRUB ORIGINAL) PADS Apply topically as needed. 1 pad, topical, As Needed, bid to eyes as needed to clean   FLUoxetine  (PROZAC ) 10 MG capsule Take 10 mg by mouth daily.   insulin  glargine (  LANTUS ) 100 UNIT/ML injection Inject 16 Units into the skin daily.   insulin  lispro (HUMALOG ) 100 UNIT/ML cartridge Inject 5 Units into the skin in the morning and at bedtime. Give 5 units  with breakfast and lunch give if he eats more than 50% of meals   loratadine (CLARITIN) 10 MG tablet Take 10 mg by mouth daily as needed for allergies.   losartan (COZAAR) 25 MG tablet Take 25 mg by mouth daily. DX: Hypertensive heart and chronic kidney disease without heart failure, with stage 1 through stage 4 chronic kidney disease, or unspecified chronic kidney disease-per NP note, code updated   NON FORMULARY DIET: DYSPHAGIA 3 / THIN LIQUIDS. May have a baked sweet potato. FOOD IN BOWLS.  TEA FOR LUNCH. MILK FOR DINNER. Add sandwich and banana to lunch and dinner trays- Does not need to be cut up in fours. Special Instructions: NO STRAWS [DX: Dysphagia, oropharyngeal phase]  No pasta per family request   Nutritional Supplements (ENSURE ENLIVE PO) Take 1 Can by mouth 2 (two) times daily.   rosuvastatin (CRESTOR) 10 MG tablet Take 10 mg by mouth daily.   No facility-administered encounter medications on file as of 09/23/2024.     SIGNIFICANT DIAGNOSTIC EXAMS  LABS REVIEWED PREVIOUS:     10-16-23: wbc 8.8; hgb 15.9; hct 49.9; mcv 93.8 plt 176; glucose 101; bun 32; creat 1.70; k+ 3.8; na++ 137; ca 9.1 gfr 41; protein 6.5 albumin  3.1 hgb A1c 6.8; chol 106; ldl 65; trig 72; hdl 27; tsh 2.208   01-01-24: ACR 256 04-25-24: wbc 7.6; hgb 15.9; hct 49.9; mcv 93.8  plt 176; glucose 84; bun 32; creat 1.48 ;k+ 4.0; na++ 134; ca 9.2 gfr 48; protein 6.8 albumin  2.8; tsh 2.449; psa 0.96; hgb A1c 7.5; chol 166; ldl 114; trig 133; hdl 25   NO NEW LABS.   Review of Systems  Reason unable to perform ROS: aphasia.    Physical Exam Constitutional:      General: He is not in acute distress.    Appearance: He is well-developed. He is not diaphoretic.  Neck:     Thyroid : No thyromegaly.  Cardiovascular:     Rate and Rhythm: Normal rate and regular rhythm.     Heart sounds: Normal heart sounds.  Pulmonary:     Effort: Pulmonary effort is normal. No respiratory distress.     Breath sounds: Normal breath sounds.  Abdominal:     General: Bowel sounds are normal. There is no distension.     Palpations: Abdomen is soft.     Tenderness: There is no abdominal tenderness.  Musculoskeletal:     Cervical back: Neck supple.     Right lower leg: No edema.     Left lower leg: No edema.     Comments: Right hemiplegia      Lymphadenopathy:     Cervical: No cervical adenopathy.  Skin:    General: Skin is warm and dry.  Neurological:     Mental Status: He is alert. Mental status is at baseline.  Psychiatric:        Mood and Affect: Mood normal.     ASSESSMENT/ PLAN:  TODAY:   Protein calorie malnutrition severe: albumin  2.8 will continue supplements as directed  2. History of nontraumatic subcortical hemorrhage of left cerebral hemisphere/right spastic hemiplegia (05-22-21); will continue baclofen 5 mg four times daily   3. Restless leg syndrome: is presently not on medications.   PREVIOUS     4. Chronic generalized pain:  will continue tylenol  650 mg twice daily   5. Aortic atherosclerosis (ct 01-04-19) is on statin  6. CAD native heart without angina: s/p cabg 2012 on cozaar 25 mg daily   7. Hypertension associated with type 2 diabetes mellitus: b/p 124/68 :will continue cozaar 25 mg daily and norvasc  5 mg daily   8. History of DVT: has completed  eliquis  therapy  9. Osteopenia: t scoe -1.421 is on supplements  10. Dyslipidemia associated with type 2 diabetes mellitus: LDL 114 will continue crestor 10 mg daily   11. Micro-albuminuria due to type 2 diabetes mellitus: 382.3 is on ARB .  12. Major depression with psychotic features: is taking prozac  20 mg daily   13. Chronic constipation: is on senna s twice daily   14. Chronic urine retention/bladder spasms: foley is out; will monitor   15. GERD without esophagitis: is now off PPI   16. Chronic non-seasonal allergic rhinitis: will continue claritin 10 mg daily   17. CKD stage 3 due to type 2 diabetes mellitus: bun 32; creat 1.48; gfr 48  18. Type 2 diabetes mellitus with peripheral vascular disease: hgb A1c 7.5 will continue humalog  5 units with meals twice daily; lantus  20 units nightly is on statin and arb   Will check hgb A1c   Barnie Seip NP [facility]  call [phone]

## 2024-09-26 ENCOUNTER — Other Ambulatory Visit (HOSPITAL_COMMUNITY)
Admission: RE | Admit: 2024-09-26 | Discharge: 2024-09-26 | Disposition: A | Discharge status: Home / Self Care | Source: Skilled Nursing Facility | Attending: Adult Health | Admitting: Adult Health

## 2024-09-26 DIAGNOSIS — E113293 Type 2 diabetes mellitus with mild nonproliferative diabetic retinopathy without macular edema, bilateral: Secondary | ICD-10-CM | POA: Diagnosis present

## 2024-09-26 LAB — HEMOGLOBIN A1C
Hgb A1c MFr Bld: 7.5 % — ABNORMAL HIGH (ref 4.8–5.6)
Mean Plasma Glucose: 168.55 mg/dL

## 2024-10-18 ENCOUNTER — Encounter: Payer: Self-pay | Admitting: Internal Medicine

## 2024-10-18 ENCOUNTER — Non-Acute Institutional Stay (SKILLED_NURSING_FACILITY): Payer: Self-pay | Admitting: Internal Medicine

## 2024-10-18 DIAGNOSIS — E1151 Type 2 diabetes mellitus with diabetic peripheral angiopathy without gangrene: Secondary | ICD-10-CM | POA: Diagnosis not present

## 2024-10-18 DIAGNOSIS — E43 Unspecified severe protein-calorie malnutrition: Secondary | ICD-10-CM

## 2024-10-18 DIAGNOSIS — I10 Essential (primary) hypertension: Secondary | ICD-10-CM | POA: Diagnosis not present

## 2024-10-18 DIAGNOSIS — N1832 Chronic kidney disease, stage 3b: Secondary | ICD-10-CM

## 2024-10-18 NOTE — Assessment & Plan Note (Signed)
 Current GFR 48, stage 3a CKD. No changes in meds or dosage indicated.

## 2024-10-18 NOTE — Assessment & Plan Note (Addendum)
 Glucose range 84-338, average 188. 09/26/24 A1c 7.5% which is @ goal of < 8%.

## 2024-10-18 NOTE — Patient Instructions (Signed)
 See assessment and plan under each diagnosis in the problem list and acutely for this visit

## 2024-10-18 NOTE — Assessment & Plan Note (Addendum)
 His dysphagia & anorexia compromise nutritional supplementation.Nutritionist will assess. Albumin  can be updated with next lab draw.

## 2024-10-18 NOTE — Assessment & Plan Note (Signed)
 BP controlled; no change in antihypertensive medications

## 2024-10-18 NOTE — Progress Notes (Signed)
   NURSING HOME LOCATION:  [facility] ROOM NUMBER:  [id number]  CODE STATUS:  DNR  PCP: Landy Barnie RAMAN, NP   This is a nursing facility follow up visit of chronic medical diagnoses to document compliance with Regulation 483.30 (c) in The Long Term Care Survey Manual Phase 2 which mandates caregiver visit ( visits can alternate among physician, PA or NP as per statutes) within 10 days of 30 days / 60 days/ 90 days post admission to SNF date  .  Interim medical record and care since last SNF visit was updated with review of diagnostic studies and change in clinical status since last visit were documented.  HPI: He is a permanent resident of this facility with medical diagnoses of atrial fibrillation; past history of gastric ulcer with hemorrhage; CAD; diverticulosis; diabetes with CKD; &  history of stroke with aphasia and right hemiparesis.  Review of systems: He is non verbal; all history is from spouse. She validates variable anorexia & food preferences. Dysphagia is persistent especially with liquids.Adherence to med administration is variable.  Physical exam:  Pertinent or positive findings: Pattern alopecia present. Facies are blank. Essentially absent eyebrows. Benign papule above OD; eschar above OS.R nasolabial fold decreased > L. Missing 2 maxillary incisors.Grade 1/2 systolic murmur; increassed S2. Breath sounds decreased.Pedal pulses decreased.SABRAHe would not oppose my hand to test strength. IOW present.   General appearance: Adequately nourished; no acute distress, increased work of breathing is present.   Lymphatic: No lymphadenopathy about the head, neck, axilla. Eyes: No conjunctival inflammation or lid edema is present. There is no scleral icterus. Ears:  External ear exam shows no significant lesions or deformities.   Nose:  External nasal examination shows no deformity or inflammation. Nasal mucosa are pink and moist without lesions, exudates Neck:  No  thyromegaly, masses, tenderness noted.    Heart:  No gallop, click, rub .  Lungs: without wheezes, rhonchi, rales, rubs. Abdomen: Bowel sounds are normal. Abdomen is soft and nontender with no organomegaly, hernias, masses. GU: Deferred  Extremities:  No cyanosis, clubbing, edema  Neurologic exam :Balance, Rhomberg, finger to nose testing could not be completed due to clinical state Skin: Warm & dry w/o tenting. No significant rash.  See summary under each active problem in the Problem List with associated updated therapeutic plan:  Type 2 diabetes mellitus with peripheral vascular disease (HCC) Glucose range 84-338, average 188. 09/26/24 A1c 7.5% which is @ goal of < 8%.  Protein-calorie malnutrition, severe His dysphagia & anorexia compromise nutritional supplementation.Nutritionist will assess. Albumin  can be updated with next lab draw.  Essential hypertension BP controlled; no change in antihypertensive medications   Stage 3b chronic kidney disease (HCC) Current GFR 48, stage 3a CKD. No changes in meds or dosage indicated.

## 2024-10-29 ENCOUNTER — Other Ambulatory Visit (HOSPITAL_COMMUNITY): Payer: Self-pay | Admitting: Occupational Therapy

## 2024-10-29 DIAGNOSIS — R1312 Dysphagia, oropharyngeal phase: Secondary | ICD-10-CM

## 2024-10-29 DIAGNOSIS — I639 Cerebral infarction, unspecified: Secondary | ICD-10-CM

## 2024-11-18 ENCOUNTER — Other Ambulatory Visit (HOSPITAL_COMMUNITY)

## 2024-11-18 ENCOUNTER — Ambulatory Visit (HOSPITAL_COMMUNITY): Admitting: Speech Pathology

## 2024-11-25 ENCOUNTER — Encounter: Payer: Self-pay | Admitting: Adult Health

## 2024-11-25 ENCOUNTER — Non-Acute Institutional Stay (SKILLED_NURSING_FACILITY): Payer: Self-pay | Admitting: Adult Health

## 2024-11-25 DIAGNOSIS — I25118 Atherosclerotic heart disease of native coronary artery with other forms of angina pectoris: Secondary | ICD-10-CM

## 2024-11-25 DIAGNOSIS — I700 Atherosclerosis of aorta: Secondary | ICD-10-CM

## 2024-11-25 NOTE — Progress Notes (Unsigned)
 Location:  [facility] Room Number: [id number] Place of Service:  SNF (31)   CODE STATUS: dnr   Allergies[1]  Chief Complaint  Patient presents with   Medical Management of Chronic Issues             Chronic generalized pain:  Aortic atherosclerosis CAD native heart without angina:    HPI:  He is a 78 y.o. long term resident of this facility being seen for the management of his chronic illnesses:Chronic generalized pain:  Aortic atherosclerosis CAD native heart without angina:there are no reports of uncontrolled pain. Weight is stable; will need a slight increase in his lantus  insulin .    Past Medical History:  Diagnosis Date   A-fib [facility])    a. Post-op afib after CABG 08/2012.   Acute gastric ulcer with hemorrhage    Acute respiratory failure with hypoxia (HCC)    Aphasia following cerebral infarction    CAD (coronary artery disease)    a. NSTEMI s/p stent to distal RCA 03/2000. b. Inf MI s/p emergent thrombectomy/stenting mid RCA 10/2000. c. NSTEMI s/p CABGx4 (LIMA-LAD, SVG-OM2, seq SVG-acute marginal and PD) 08/16/12 - course complicated by confusion, post-op AF, L pleural effusion with thoracentesis. d. Normal LV function by echo 08/2012.   Diverticulosis    DM with CKD    Dysphagia following cerebral infarction    Encephalopathy    Extended spectrum beta lactamase (ESBL) resistance    Generalized anxiety disorder    GERD (gastroesophageal reflux disease)    Hemiplegia and hemiparesis following cerebral infarction affecting right dominant side (HCC)    Hiatal hernia    HLD (hyperlipidemia)    HTN (hypertension)    Non-ST elevation (NSTEMI) myocardial infarction [facility])    Nontraumatic intracerebral hemorrhage in hemisphere, subcortical (HCC)    Peripheral vascular disease    a. Carotid dopplers neg 08/13/12. b. Pre-cabg ABIs - R=0.87 suggesting mild dz, L=1.29 possibly falsely elevated due to calcified vessels.   Pleural effusion    a. L pleural eff after CABG  s/p thoracentesis 08/22/12.   Prostate cancer [facility])    Status post radiation treatment.   Prostatic hypertrophy    a. Hx of urinary retention, awaiting TURP.   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 Mild  MR by TEE 08/2012.    Past Surgical History:  Procedure Laterality Date   APPENDECTOMY     BACK SURGERY     CORONARY ARTERY BYPASS GRAFT  08/16/2012   Procedure: CORONARY ARTERY BYPASS GRAFTING (CABG);  Surgeon: Elspeth JAYSON Millers, MD;  Location: [facility] OR;  Service: Open Heart Surgery;  Laterality: N/A;   IR REPLC GASTRO/COLONIC TUBE PERCUT W/FLUORO  02/13/2019   LEFT HEART CATHETERIZATION WITH CORONARY ANGIOGRAM N/A 08/14/2012   Procedure: LEFT HEART CATHETERIZATION WITH CORONARY ANGIOGRAM;  Surgeon: Peter M Jordan, MD;  Location: [facility] CATH LAB;  Service: Cardiovascular;  Laterality: N/A;    Social History   Socioeconomic History   Marital status: Married    Spouse name: Not on file   Number of children: Not on file   Years of education: Not on file   Highest education level: Not on file  Occupational History   Occupation: retired   Tobacco Use   Smoking status: Former    Current packs/day: 0.00    Types: Cigarettes    Quit date:  11/12/1972    Years since quitting: 52.0   Smokeless tobacco: Never  Vaping Use   Vaping status: Never Used  Substance and Sexual Activity   Alcohol use: No   Drug use: No   Sexual activity: Not Currently  Other Topics Concern   Not on file  Social History Narrative   He is a long term patient of [facility]    Social Drivers of Health   Tobacco Use: Medium Risk (11/25/2024)   Patient History    Smoking Tobacco Use: Former    Smokeless Tobacco Use: Never    Passive Exposure: Not on Actuary Strain: Not on file  Food Insecurity: Not on file  Transportation Needs: Not on file  Physical Activity: Not on file   Stress: Not on file  Social Connections: Unknown (04/25/2022)   [facility]   Social Network    Social Network: Not on file  Intimate Partner Violence: Unknown (03/17/2022)   Received from [facility]   HITS    Physically Hurt: Not on file    Insult or Talk Down To: Not on file    Threaten Physical Harm: Not on file    Scream or Curse: Not on file  Depression (PHQ2-9): Low Risk (09/29/2023)   Depression (PHQ2-9)    PHQ-2 Score: 0  Alcohol Screen: Not on file  Housing: Not on file  Utilities: Not on file  Health Literacy: Not on file   Family History  Problem Relation Age of Onset   Hypertension Mother       VITAL SIGNS BP 130/68   Pulse 82   Temp (!) 97.5 F (36.4 C)   Resp 18   Ht 5' 8.5 (1.74 m)   Wt 166 lb (75.3 kg)   SpO2 97%   BMI 24.87 kg/m   Outpatient Encounter Medications as of 11/25/2024  Medication Sig   acetaminophen  (TYLENOL ) 325 MG tablet Take 650 mg by mouth 2 (two) times daily.   amLODipine  (NORVASC ) 2.5 MG tablet Take 2.5 mg by mouth daily.   baclofen (LIORESAL) 10 MG tablet Take 5 mg by mouth 4 (four) times daily.   Balsam [place]-Castor Oil (VENELEX) OINT Apply topically.   BD AUTOSHIELD DUO 30G X 5 MM MISC 3/16   clotrimazole (LOTRIMIN) 1 % cream Apply 1 Application topically as directed.  every shift as needed to head od penis As Needed; PRN 1, PRN 2, PRN 3   Continuous Glucose Sensor (FREESTYLE LIBRE 3 SENSOR) MISC 1 Device by Does not apply route every 14 (fourteen) days. Place 1 sensor on the skin every 14 days. Use to check glucose continuously   Eyelid Cleansers (OCUSOFT LID SCRUB ORIGINAL) PADS Apply topically as needed. 1 pad, topical, As Needed, bid to eyes as needed to clean   FLUoxetine  (PROZAC ) 10 MG capsule Take 10 mg by mouth daily.   insulin  glargine (LANTUS ) 100 UNIT/ML injection Inject 16 Units into the skin daily.   insulin  lispro (HUMALOG ) 100 UNIT/ML cartridge Inject 5 Units into the skin in the morning and at  bedtime. Give 5 units  with breakfast and lunch give if he eats more than 50% of meals   loratadine (CLARITIN) 10 MG tablet Take 10 mg by mouth daily as needed for allergies.   losartan (COZAAR) 25 MG tablet Take 25 mg by mouth daily. DX: Hypertensive heart and chronic kidney disease without heart failure, with stage 1 through stage 4 chronic kidney disease, or unspecified chronic kidney disease-per NP  note, code updated   NON FORMULARY DIET: DYSPHAGIA 3 / THIN LIQUIDS. May have a baked sweet potato. FOOD IN BOWLS.  TEA FOR LUNCH. MILK FOR DINNER. Add sandwich and banana to lunch and dinner trays- Does not need to be cut up in fours. Special Instructions: NO STRAWS [DX: Dysphagia, oropharyngeal phase]  No pasta per family request   Nutritional Supplements (ENSURE ENLIVE PO) Take 1 Can by mouth 2 (two) times daily.   rosuvastatin  (CRESTOR ) 10 MG tablet Take 10 mg by mouth daily.   sennosides-docusate sodium  (SENOKOT-S) 8.6-50 MG tablet Take 1 tablet by mouth in the morning and at bedtime.   No facility-administered encounter medications on file as of 11/25/2024.     SIGNIFICANT DIAGNOSTIC EXAMS  LABS REVIEWED PREVIOUS:     01-01-24: ACR 256 04-25-24: wbc 7.6; hgb 15.9; hct 49.9; mcv 93.8 plt 176; glucose 84; bun 32; creat 1.48 ;k+ 4.0; na++ 134; ca 9.2 gfr 48; protein 6.8 albumin  2.8; tsh 2.449; psa 0.96; hgb A1c 7.5; chol 166; ldl 114; trig 133; hdl 25   TODAY  09-26-24: hgb A1c 7.5   Review of Systems  Reason unable to perform ROS: aphasia.    Physical Exam Constitutional:      General: He is not in acute distress.    Appearance: He is well-developed. He is not diaphoretic.  Neck:     Thyroid : No thyromegaly.  Cardiovascular:     Rate and Rhythm: Normal rate and regular rhythm.     Heart sounds: Normal heart sounds.  Pulmonary:     Effort: Pulmonary effort is normal. No respiratory distress.     Breath sounds: Normal breath sounds.  Abdominal:     General: Bowel sounds are  normal. There is no distension.     Palpations: Abdomen is soft.     Tenderness: There is no abdominal tenderness.  Musculoskeletal:     Cervical back: Neck supple.     Right lower leg: No edema.     Left lower leg: No edema.     Comments: Right hemiplegia     Lymphadenopathy:     Cervical: No cervical adenopathy.  Skin:    General: Skin is warm and dry.  Neurological:     Mental Status: He is alert. Mental status is at baseline.  Psychiatric:        Mood and Affect: Mood normal.      ASSESSMENT/ PLAN:  TODAY:   Chronic generalized pain: will continue tylenol  650 mg twice daily   2. Aortic atherosclerosis (ct 01-04-19) is on statin  3. CAD native heart without angina: s/p cabg 2012; cozaar 25 mg daily   PREVIOUS     4. Hypertension associated with type 2 diabetes mellitus: b/p 130/68 :will continue cozaar 25 mg daily and norvasc  5 mg daily   5. History of DVT: has completed eliquis  therapy  6. Osteopenia: t scoe -1.421 is on supplements  7. Dyslipidemia associated with type 2 diabetes mellitus: LDL 114 will continue crestor  10 mg daily   8. Micro-albuminuria due to type 2 diabetes mellitus: 382.3 is on ARB .  9. Major depression with psychotic features: is taking prozac  20 mg daily   10. Chronic constipation: is on senna s twice daily   11. Chronic urine retention/bladder spasms: foley is out; will monitor   12. GERD without esophagitis: is now off PPI   13. Chronic non-seasonal allergic rhinitis: will continue claritin 10 mg daily   14. CKD stage 3 due to  type 2 diabetes mellitus: bun 32; creat 1.48; gfr 48  15. Type 2 diabetes mellitus with peripheral vascular disease: hgb A1c 7.5 will continue humalog  5 units with meals twice daily; lantus  24 units nightly is on statin and arb   16. Protein calorie malnutrition severe: albumin  2.8 will continue supplements as directed  17. History of nontraumatic subcortical hemorrhage of left cerebral hemisphere/right  spastic hemiplegia (05-22-21); will continue baclofen 5 mg four times daily   18. Restless leg syndrome: is presently not on medications.      Barnie Seip NP [facility]   call [phone]      [1] No Known Allergies

## 2024-11-28 ENCOUNTER — Other Ambulatory Visit: Payer: Self-pay

## 2024-11-28 ENCOUNTER — Emergency Department (HOSPITAL_COMMUNITY)

## 2024-11-28 ENCOUNTER — Encounter: Payer: Self-pay | Admitting: Adult Health

## 2024-11-28 ENCOUNTER — Non-Acute Institutional Stay: Payer: Self-pay | Admitting: Adult Health

## 2024-11-28 ENCOUNTER — Inpatient Hospital Stay (HOSPITAL_COMMUNITY)

## 2024-11-28 ENCOUNTER — Inpatient Hospital Stay (HOSPITAL_COMMUNITY)
Admission: EM | Admit: 2024-11-28 | Discharge: 2024-12-02 | DRG: 175 | Disposition: A | Discharge status: Other Acute Inpatient Hospital | Source: Skilled Nursing Facility | Attending: Hospitalist | Admitting: Hospitalist

## 2024-11-28 DIAGNOSIS — R29719 NIHSS score 19: Secondary | ICD-10-CM | POA: Diagnosis not present

## 2024-11-28 DIAGNOSIS — I6930 Unspecified sequelae of cerebral infarction: Secondary | ICD-10-CM | POA: Diagnosis not present

## 2024-11-28 DIAGNOSIS — I69391 Dysphagia following cerebral infarction: Secondary | ICD-10-CM | POA: Diagnosis not present

## 2024-11-28 DIAGNOSIS — E1159 Type 2 diabetes mellitus with other circulatory complications: Secondary | ICD-10-CM | POA: Diagnosis present

## 2024-11-28 DIAGNOSIS — Z794 Long term (current) use of insulin: Secondary | ICD-10-CM | POA: Diagnosis not present

## 2024-11-28 DIAGNOSIS — Z66 Do not resuscitate: Secondary | ICD-10-CM | POA: Diagnosis present

## 2024-11-28 DIAGNOSIS — Z923 Personal history of irradiation: Secondary | ICD-10-CM

## 2024-11-28 DIAGNOSIS — E1165 Type 2 diabetes mellitus with hyperglycemia: Secondary | ICD-10-CM | POA: Diagnosis present

## 2024-11-28 DIAGNOSIS — E7849 Other hyperlipidemia: Secondary | ICD-10-CM | POA: Diagnosis present

## 2024-11-28 DIAGNOSIS — F323 Major depressive disorder, single episode, severe with psychotic features: Secondary | ICD-10-CM | POA: Diagnosis present

## 2024-11-28 DIAGNOSIS — N400 Enlarged prostate without lower urinary tract symptoms: Secondary | ICD-10-CM | POA: Diagnosis present

## 2024-11-28 DIAGNOSIS — E1151 Type 2 diabetes mellitus with diabetic peripheral angiopathy without gangrene: Secondary | ICD-10-CM | POA: Diagnosis present

## 2024-11-28 DIAGNOSIS — F0152 Vascular dementia, unspecified severity, with psychotic disturbance: Secondary | ICD-10-CM | POA: Diagnosis present

## 2024-11-28 DIAGNOSIS — E1122 Type 2 diabetes mellitus with diabetic chronic kidney disease: Secondary | ICD-10-CM | POA: Diagnosis present

## 2024-11-28 DIAGNOSIS — N1832 Chronic kidney disease, stage 3b: Secondary | ICD-10-CM | POA: Diagnosis present

## 2024-11-28 DIAGNOSIS — I2699 Other pulmonary embolism without acute cor pulmonale: Secondary | ICD-10-CM | POA: Diagnosis not present

## 2024-11-28 DIAGNOSIS — I739 Peripheral vascular disease, unspecified: Secondary | ICD-10-CM | POA: Diagnosis present

## 2024-11-28 DIAGNOSIS — I2510 Atherosclerotic heart disease of native coronary artery without angina pectoris: Secondary | ICD-10-CM | POA: Diagnosis present

## 2024-11-28 DIAGNOSIS — I69398 Other sequelae of cerebral infarction: Secondary | ICD-10-CM

## 2024-11-28 DIAGNOSIS — F0153 Vascular dementia, unspecified severity, with mood disturbance: Secondary | ICD-10-CM | POA: Diagnosis present

## 2024-11-28 DIAGNOSIS — Z79899 Other long term (current) drug therapy: Secondary | ICD-10-CM | POA: Diagnosis not present

## 2024-11-28 DIAGNOSIS — R531 Weakness: Secondary | ICD-10-CM | POA: Diagnosis present

## 2024-11-28 DIAGNOSIS — I639 Cerebral infarction, unspecified: Principal | ICD-10-CM

## 2024-11-28 DIAGNOSIS — Z951 Presence of aortocoronary bypass graft: Secondary | ICD-10-CM

## 2024-11-28 DIAGNOSIS — F411 Generalized anxiety disorder: Secondary | ICD-10-CM | POA: Diagnosis present

## 2024-11-28 DIAGNOSIS — N179 Acute kidney failure, unspecified: Secondary | ICD-10-CM | POA: Diagnosis present

## 2024-11-28 DIAGNOSIS — I4891 Unspecified atrial fibrillation: Secondary | ICD-10-CM | POA: Diagnosis present

## 2024-11-28 DIAGNOSIS — E1169 Type 2 diabetes mellitus with other specified complication: Secondary | ICD-10-CM | POA: Diagnosis present

## 2024-11-28 DIAGNOSIS — Z8249 Family history of ischemic heart disease and other diseases of the circulatory system: Secondary | ICD-10-CM

## 2024-11-28 DIAGNOSIS — G2581 Restless legs syndrome: Secondary | ICD-10-CM | POA: Diagnosis present

## 2024-11-28 DIAGNOSIS — F0150 Vascular dementia without behavioral disturbance: Secondary | ICD-10-CM | POA: Diagnosis present

## 2024-11-28 DIAGNOSIS — I10 Essential (primary) hypertension: Secondary | ICD-10-CM | POA: Diagnosis not present

## 2024-11-28 DIAGNOSIS — I129 Hypertensive chronic kidney disease with stage 1 through stage 4 chronic kidney disease, or unspecified chronic kidney disease: Secondary | ICD-10-CM | POA: Diagnosis present

## 2024-11-28 DIAGNOSIS — I69351 Hemiplegia and hemiparesis following cerebral infarction affecting right dominant side: Secondary | ICD-10-CM

## 2024-11-28 DIAGNOSIS — G928 Other toxic encephalopathy: Principal | ICD-10-CM | POA: Diagnosis present

## 2024-11-28 DIAGNOSIS — G9389 Other specified disorders of brain: Secondary | ICD-10-CM | POA: Diagnosis present

## 2024-11-28 DIAGNOSIS — I2782 Chronic pulmonary embolism: Secondary | ICD-10-CM | POA: Diagnosis present

## 2024-11-28 DIAGNOSIS — R911 Solitary pulmonary nodule: Secondary | ICD-10-CM | POA: Diagnosis present

## 2024-11-28 DIAGNOSIS — Z7401 Bed confinement status: Secondary | ICD-10-CM

## 2024-11-28 DIAGNOSIS — I252 Old myocardial infarction: Secondary | ICD-10-CM

## 2024-11-28 DIAGNOSIS — E785 Hyperlipidemia, unspecified: Secondary | ICD-10-CM | POA: Diagnosis not present

## 2024-11-28 DIAGNOSIS — K219 Gastro-esophageal reflux disease without esophagitis: Secondary | ICD-10-CM | POA: Diagnosis present

## 2024-11-28 DIAGNOSIS — F03918 Unspecified dementia, unspecified severity, with other behavioral disturbance: Secondary | ICD-10-CM | POA: Diagnosis not present

## 2024-11-28 DIAGNOSIS — N183 Chronic kidney disease, stage 3 unspecified: Secondary | ICD-10-CM | POA: Diagnosis present

## 2024-11-28 DIAGNOSIS — I69320 Aphasia following cerebral infarction: Secondary | ICD-10-CM | POA: Diagnosis not present

## 2024-11-28 DIAGNOSIS — I152 Hypertension secondary to endocrine disorders: Secondary | ICD-10-CM | POA: Diagnosis present

## 2024-11-28 DIAGNOSIS — R310 Gross hematuria: Secondary | ICD-10-CM | POA: Diagnosis not present

## 2024-11-28 DIAGNOSIS — Z87891 Personal history of nicotine dependence: Secondary | ICD-10-CM

## 2024-11-28 DIAGNOSIS — E119 Type 2 diabetes mellitus without complications: Secondary | ICD-10-CM | POA: Diagnosis not present

## 2024-11-28 DIAGNOSIS — R29818 Other symptoms and signs involving the nervous system: Secondary | ICD-10-CM | POA: Insufficient documentation

## 2024-11-28 DIAGNOSIS — R569 Unspecified convulsions: Secondary | ICD-10-CM | POA: Diagnosis not present

## 2024-11-28 DIAGNOSIS — Z8546 Personal history of malignant neoplasm of prostate: Secondary | ICD-10-CM

## 2024-11-28 DIAGNOSIS — Z86718 Personal history of other venous thrombosis and embolism: Secondary | ICD-10-CM

## 2024-11-28 LAB — HEPARIN LEVEL (UNFRACTIONATED): Heparin Unfractionated: 1.01 [IU]/mL — ABNORMAL HIGH (ref 0.30–0.70)

## 2024-11-28 LAB — PROTIME-INR
INR: 1.1 (ref 0.8–1.2)
Prothrombin Time: 14.8 s (ref 11.4–15.2)

## 2024-11-28 LAB — DIFFERENTIAL
Abs Immature Granulocytes: 0.03 K/uL (ref 0.00–0.07)
Basophils Absolute: 0.1 K/uL (ref 0.0–0.1)
Basophils Relative: 1 %
Eosinophils Absolute: 0.6 K/uL — ABNORMAL HIGH (ref 0.0–0.5)
Eosinophils Relative: 6 %
Immature Granulocytes: 0 %
Lymphocytes Relative: 32 %
Lymphs Abs: 3.1 K/uL (ref 0.7–4.0)
Monocytes Absolute: 0.7 K/uL (ref 0.1–1.0)
Monocytes Relative: 7 %
Neutro Abs: 5.4 K/uL (ref 1.7–7.7)
Neutrophils Relative %: 54 %

## 2024-11-28 LAB — COMPREHENSIVE METABOLIC PANEL WITH GFR
ALT: 15 U/L (ref 0–44)
AST: 23 U/L (ref 15–41)
Albumin: 3.1 g/dL — ABNORMAL LOW (ref 3.5–5.0)
Alkaline Phosphatase: 96 U/L (ref 38–126)
Anion gap: 5 (ref 5–15)
BUN: 41 mg/dL — ABNORMAL HIGH (ref 8–23)
CO2: 28 mmol/L (ref 22–32)
Calcium: 9.4 mg/dL (ref 8.9–10.3)
Chloride: 106 mmol/L (ref 98–111)
Creatinine, Ser: 1.9 mg/dL — ABNORMAL HIGH (ref 0.61–1.24)
GFR, Estimated: 35 mL/min — ABNORMAL LOW (ref >60)
Glucose, Bld: 135 mg/dL — ABNORMAL HIGH (ref 70–99)
Potassium: 4.4 mmol/L (ref 3.5–5.1)
Sodium: 140 mmol/L (ref 135–145)
Total Bilirubin: 0.6 mg/dL (ref 0.0–1.2)
Total Protein: 5.9 g/dL — ABNORMAL LOW (ref 6.5–8.1)

## 2024-11-28 LAB — CBC
HCT: 50.7 % (ref 39.0–52.0)
Hemoglobin: 16.4 g/dL (ref 13.0–17.0)
MCH: 30.8 pg (ref 26.0–34.0)
MCHC: 32.3 g/dL (ref 30.0–36.0)
MCV: 95.1 fL (ref 80.0–100.0)
Platelets: 182 K/uL (ref 150–400)
RBC: 5.33 MIL/uL (ref 4.22–5.81)
RDW: 12.4 % (ref 11.5–15.5)
WBC: 9.9 K/uL (ref 4.0–10.5)
nRBC: 0 % (ref 0.0–0.2)

## 2024-11-28 LAB — I-STAT CHEM 8, ED
BUN: 54 mg/dL — ABNORMAL HIGH (ref 8–23)
Calcium, Ion: 1.21 mmol/L (ref 1.15–1.40)
Chloride: 106 mmol/L (ref 98–111)
Creatinine, Ser: 2.1 mg/dL — ABNORMAL HIGH (ref 0.61–1.24)
Glucose, Bld: 153 mg/dL — ABNORMAL HIGH (ref 70–99)
HCT: 49 % (ref 39.0–52.0)
Hemoglobin: 16.7 g/dL (ref 13.0–17.0)
Potassium: 5.3 mmol/L — ABNORMAL HIGH (ref 3.5–5.1)
Sodium: 143 mmol/L (ref 135–145)
TCO2: 30 mmol/L (ref 22–32)

## 2024-11-28 LAB — CBG MONITORING, ED
Glucose-Capillary: 146 mg/dL — ABNORMAL HIGH (ref 70–99)
Glucose-Capillary: 75 mg/dL (ref 70–99)
Glucose-Capillary: 72 mg/dL (ref 70–99)
Glucose-Capillary: 98 mg/dL (ref 70–99)

## 2024-11-28 LAB — APTT: aPTT: 30 s (ref 24–36)

## 2024-11-28 LAB — ETHANOL: Alcohol, Ethyl (B): <15 mg/dL (ref <15)

## 2024-11-28 MED ORDER — ACETAMINOPHEN 325 MG PO TABS
650.0000 mg | ORAL_TABLET | ORAL | Status: DC | PRN
  Administered 2024-11-30 – 2024-12-02 (×2): 650 mg via ORAL
  Filled 2024-11-28 (×2): qty 2

## 2024-11-28 MED ORDER — SENNOSIDES-DOCUSATE SODIUM 8.6-50 MG PO TABS: 1.0000 | ORAL_TABLET | Freq: Every evening | ORAL | Status: DC | PRN

## 2024-11-28 MED ORDER — ROSUVASTATIN CALCIUM 5 MG PO TABS
10.0000 mg | ORAL_TABLET | Freq: Every day | ORAL | Status: DC
  Administered 2024-11-29 – 2024-12-01 (×2): 10 mg via ORAL
  Filled 2024-11-28 (×3): qty 2

## 2024-11-28 MED ORDER — INSULIN ASPART 100 UNIT/ML IJ SOLN
0.0000 [IU] | INTRAMUSCULAR | Status: DC
  Administered 2024-11-29: 2 [IU] via SUBCUTANEOUS
  Administered 2024-11-29: 3 [IU] via SUBCUTANEOUS
  Administered 2024-11-30: 8 [IU] via SUBCUTANEOUS
  Administered 2024-11-30 – 2024-12-01 (×4): 3 [IU] via SUBCUTANEOUS
  Administered 2024-12-01: 5 [IU] via SUBCUTANEOUS
  Administered 2024-12-01 (×2): 3 [IU] via SUBCUTANEOUS
  Administered 2024-12-01: 2 [IU] via SUBCUTANEOUS
  Administered 2024-12-01: 5 [IU] via SUBCUTANEOUS
  Filled 2024-11-28 (×3): qty 2
  Filled 2024-11-28: qty 5
  Filled 2024-11-28 (×4): qty 3
  Filled 2024-11-28: qty 5
  Filled 2024-11-28: qty 3
  Filled 2024-11-28: qty 8

## 2024-11-28 MED ORDER — SODIUM CHLORIDE 0.9% FLUSH
3.0000 mL | Freq: Once | INTRAVENOUS | Status: AC
  Administered 2024-11-28: 11:00:00 3 mL via INTRAVENOUS

## 2024-11-28 MED ORDER — STROKE: EARLY STAGES OF RECOVERY BOOK
Freq: Once | Status: AC
  Filled 2024-11-28: qty 1

## 2024-11-28 MED ORDER — FLUOXETINE HCL 10 MG PO CAPS
10.0000 mg | ORAL_CAPSULE | Freq: Every day | ORAL | Status: DC
  Administered 2024-11-30 – 2024-12-02 (×3): 10 mg via ORAL
  Filled 2024-11-28 (×4): qty 1

## 2024-11-28 MED ORDER — IOHEXOL 350 MG/ML SOLN
75.0000 mL | Freq: Once | INTRAVENOUS | Status: AC | PRN
  Administered 2024-11-28: 11:00:00 75 mL via INTRAVENOUS

## 2024-11-28 MED ORDER — ACETAMINOPHEN 160 MG/5ML PO SOLN: 650.0000 mg | ORAL | Status: DC | PRN

## 2024-11-28 MED ORDER — SODIUM CHLORIDE 0.9 % IV SOLN: INTRAVENOUS | Status: AC

## 2024-11-28 MED ORDER — ASPIRIN 81 MG PO TBEC: 81.0000 mg | DELAYED_RELEASE_TABLET | Freq: Every day | ORAL | Status: DC

## 2024-11-28 MED ORDER — ACETAMINOPHEN 650 MG RE SUPP: 650.0000 mg | RECTAL | Status: DC | PRN

## 2024-11-28 MED ORDER — HEPARIN (PORCINE) 25000 UT/250ML-% IV SOLN
850.0000 [IU]/h | INTRAVENOUS | Status: DC
  Administered 2024-11-28: 13:00:00 850 [IU]/h via INTRAVENOUS
  Filled 2024-11-28: qty 250

## 2024-11-28 MED ORDER — HEPARIN (PORCINE) 25000 UT/250ML-% IV SOLN
800.0000 [IU]/h | INTRAVENOUS | Status: DC
  Administered 2024-11-28: 700 [IU]/h via INTRAVENOUS
  Administered 2024-11-29: 750 [IU]/h via INTRAVENOUS
  Filled 2024-11-28: qty 250

## 2024-11-28 MED ORDER — INSULIN GLARGINE 100 UNIT/ML ~~LOC~~ SOLN
10.0000 [IU] | Freq: Every day | SUBCUTANEOUS | Status: DC
  Administered 2024-11-30 – 2024-12-02 (×3): 10 [IU] via SUBCUTANEOUS
  Filled 2024-11-28 (×4): qty 0.1

## 2024-11-28 NOTE — Progress Notes (Signed)
 Location:  [facility] Room Number: 146 Place of Service:  SNF (31)   CODE STATUS: dnr   Allergies[1]  Chief Complaint  Patient presents with   Acute Visit    Change in status     HPI:  This morning he did not eat breakfast but did drink. He is not responsive to verbal stimuli; will frown with tactile stimulation. No resistance to lifting eyelids. His most form reads for everything to be done except cpr. Will send him to the ed.   Past Medical History:  Diagnosis Date   A-fib [facility])    a. Post-op afib after CABG 08/2012.   Acute gastric ulcer with hemorrhage    Acute respiratory failure with hypoxia (HCC)    Aphasia following cerebral infarction    CAD (coronary artery disease)    a. NSTEMI s/p stent to distal RCA 03/2000. b. Inf MI s/p emergent thrombectomy/stenting mid RCA 10/2000. c. NSTEMI s/p CABGx4 (LIMA-LAD, SVG-OM2, seq SVG-acute marginal and PD) 08/16/12 - course complicated by confusion, post-op AF, L pleural effusion with thoracentesis. d. Normal LV function by echo 08/2012.   Diverticulosis    DM with CKD    Dysphagia following cerebral infarction    Encephalopathy    Extended spectrum beta lactamase (ESBL) resistance    Generalized anxiety disorder    GERD (gastroesophageal reflux disease)    Hemiplegia and hemiparesis following cerebral infarction affecting right dominant side (HCC)    Hiatal hernia    HLD (hyperlipidemia)    HTN (hypertension)    Non-ST elevation (NSTEMI) myocardial infarction [facility])    Nontraumatic intracerebral hemorrhage in hemisphere, subcortical (HCC)    Peripheral vascular disease    a. Carotid dopplers neg 08/13/12. b. Pre-cabg ABIs - R=0.87 suggesting mild dz, L=1.29 possibly falsely elevated due to calcified vessels.   Pleural effusion    a. L pleural eff after CABG s/p thoracentesis 08/22/12.   Prostate cancer [facility])    Status post radiation treatment.   Prostatic hypertrophy    a. Hx of urinary retention, awaiting  TURP.   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 Mild  MR by TEE 08/2012.    Past Surgical History:  Procedure Laterality Date   APPENDECTOMY     BACK SURGERY     CORONARY ARTERY BYPASS GRAFT  08/16/2012   Procedure: CORONARY ARTERY BYPASS GRAFTING (CABG);  Surgeon: Elspeth JAYSON Millers, MD;  Location: [facility] OR;  Service: Open Heart Surgery;  Laterality: N/A;   IR REPLC GASTRO/COLONIC TUBE PERCUT W/FLUORO  02/13/2019   LEFT HEART CATHETERIZATION WITH CORONARY ANGIOGRAM N/A 08/14/2012   Procedure: LEFT HEART CATHETERIZATION WITH CORONARY ANGIOGRAM;  Surgeon: Peter M Jordan, MD;  Location: [facility] CATH LAB;  Service: Cardiovascular;  Laterality: N/A;    Social History   Socioeconomic History   Marital status: Married    Spouse name: Not on file   Number of children: Not on file   Years of education: Not on file   Highest education level: Not on file  Occupational History   Occupation: retired   Tobacco Use   Smoking status: Former    Current packs/day: 0.00    Types: Cigarettes    Quit date: 11/12/1972    Years since quitting: 52.0   Smokeless tobacco: Never  Vaping Use   Vaping status: Never Used  Substance  and Sexual Activity   Alcohol use: No   Drug use: No   Sexual activity: Not Currently  Other Topics Concern   Not on file  Social History Narrative   He is a long term patient of [facility]    Social Drivers of Health   Tobacco Use: Medium Risk (11/28/2024)   Patient History    Smoking Tobacco Use: Former    Smokeless Tobacco Use: Never    Passive Exposure: Not on Actuary Strain: Not on file  Food Insecurity: Not on file  Transportation Needs: Not on file  Physical Activity: Not on file  Stress: Not on file  Social Connections: Unknown (04/25/2022)   [facility]   Social Network    Social Network: Not on file  Intimate Partner  Violence: Unknown (03/17/2022)   Received from [facility]   HITS    Physically Hurt: Not on file    Insult or Talk Down To: Not on file    Threaten Physical Harm: Not on file    Scream or Curse: Not on file  Depression (PHQ2-9): Low Risk (09/29/2023)   Depression (PHQ2-9)    PHQ-2 Score: 0  Alcohol Screen: Not on file  Housing: Not on file  Utilities: Not on file  Health Literacy: Not on file   Family History  Problem Relation Age of Onset   Hypertension Mother       VITAL SIGNS BP 98/60   Pulse 68   Temp 98.3 F (36.8 C)   Resp 18   Ht 5' 8.5 (1.74 m)   Wt 166 lb (75.3 kg)   SpO2 97%   BMI 24.87 kg/m   Outpatient Encounter Medications as of 11/28/2024  Medication Sig   acetaminophen  (TYLENOL ) 325 MG tablet Take 650 mg by mouth 2 (two) times daily.   amLODipine  (NORVASC ) 2.5 MG tablet Take 2.5 mg by mouth daily.   baclofen (LIORESAL) 10 MG tablet Take 5 mg by mouth 4 (four) times daily.   Balsam [place]-Castor Oil (VENELEX) OINT Apply topically.   BD AUTOSHIELD DUO 30G X 5 MM MISC 3/16   clotrimazole (LOTRIMIN) 1 % cream Apply 1 Application topically as directed.  every shift as needed to head od penis As Needed; PRN 1, PRN 2, PRN 3   Continuous Glucose Sensor (FREESTYLE LIBRE 3 SENSOR) MISC 1 Device by Does not apply route every 14 (fourteen) days. Place 1 sensor on the skin every 14 days. Use to check glucose continuously   Eyelid Cleansers (OCUSOFT LID SCRUB ORIGINAL) PADS Apply topically as needed. 1 pad, topical, As Needed, bid to eyes as needed to clean   FLUoxetine  (PROZAC ) 10 MG capsule Take 10 mg by mouth daily.   insulin  glargine (LANTUS ) 100 UNIT/ML injection Inject 16 Units into the skin daily.   insulin  lispro (HUMALOG ) 100 UNIT/ML cartridge Inject 5 Units into the skin in the morning and at bedtime. Give 5 units  with breakfast and lunch give if he eats more than 50% of meals   loratadine (CLARITIN) 10 MG tablet Take 10 mg by mouth daily as needed for  allergies.   losartan (COZAAR) 25 MG tablet Take 25 mg by mouth daily. DX: Hypertensive heart and chronic kidney disease without heart failure, with stage 1 through stage 4 chronic kidney disease, or unspecified chronic kidney disease-per NP note, code updated   NON FORMULARY DIET: DYSPHAGIA 3 / THIN LIQUIDS. May have a baked sweet potato. FOOD IN BOWLS.  TEA FOR  LUNCH. MILK FOR DINNER. Add sandwich and banana to lunch and dinner trays- Does not need to be cut up in fours. Special Instructions: NO STRAWS [DX: Dysphagia, oropharyngeal phase]  No pasta per family request   Nutritional Supplements (ENSURE ENLIVE PO) Take 1 Can by mouth 2 (two) times daily.   rosuvastatin  (CRESTOR ) 10 MG tablet Take 10 mg by mouth daily.   sennosides-docusate sodium  (SENOKOT-S) 8.6-50 MG tablet Take 1 tablet by mouth in the morning and at bedtime.   No facility-administered encounter medications on file as of 11/28/2024.     SIGNIFICANT DIAGNOSTIC EXAMS  LABS REVIEWED PREVIOUS:     10-16-23: wbc 8.8; hgb 15.9; hct 49.9; mcv 93.8 plt 176; glucose 101; bun 32; creat 1.70; k+ 3.8; na++ 137; ca 9.1 gfr 41; protein 6.5 albumin  3.1 hgb A1c 6.8; chol 106; ldl 65; trig 72; hdl 27; tsh 2.208   01-01-24: ACR 256 04-25-24: wbc 7.6; hgb 15.9; hct 49.9; mcv 93.8 plt 176; glucose 84; bun 32; creat 1.48 ;k+ 4.0; na++ 134; ca 9.2 gfr 48; protein 6.8 albumin  2.8; tsh 2.449; psa 0.96; hgb A1c 7.5; chol 166; ldl 114; trig 133; hdl 25   NO NEW LABS.   Review of Systems  Unable to perform ROS: Medical condition    Physical Exam Constitutional:      General: He is not in acute distress.    Appearance: He is well-developed. He is not diaphoretic.  Neck:     Thyroid : No thyromegaly.  Cardiovascular:     Rate and Rhythm: Normal rate and regular rhythm.     Heart sounds: Normal heart sounds.  Pulmonary:     Effort: Pulmonary effort is normal. No respiratory distress.     Breath sounds: Normal breath sounds.  Abdominal:      General: Bowel sounds are normal. There is no distension.     Palpations: Abdomen is soft.     Tenderness: There is no abdominal tenderness.  Musculoskeletal:     Cervical back: Neck supple.     Right lower leg: No edema.     Left lower leg: No edema.     Comments: Right hemiplegia   Lymphadenopathy:     Cervical: No cervical adenopathy.  Skin:    General: Skin is warm and dry.       ASSESSMENT/ PLAN:  TODAY  History of CVA: will send him to the ED for possible CVA and further    Barnie Seip NP [facility]  call [phone]     [1] No Known Allergies

## 2024-11-28 NOTE — H&P (Signed)
 History and Physical   [name] FMW:[id number] DOB: 26-Feb-1946 DOA: 11/28/2024  PCP: Landy Barnie RAMAN, NP   Patient coming from: [facility]  Chief Complaint: Neurologic deficits  HPI: [name] is a 78 y.o. male with medical history significant of hypertension, hyperlipidemia, diabetes, CKD 3B, PAD, GERD, CVA, CAD, A-fib, DVT, dementia, depression, RLS presenting as a code stroke from [facility] nursing center due to decreased verbal responsiveness and left-sided weakness.  Patient resides at [facility] nursing center.  Was seen today after he noted to have some left-sided weakness and decreased verbal responsiveness.  Was not eating breakfast either.  Has some baseline right-sided deficits from prior stroke.  Patient unable to participate in review of systems.  Vital signs in the ED notable for blood pressure in the 90s-140 systolic, heart rate in the 50s-60s.  Lab workup included CMP with BUN 41, creatinine stable 1.9, glucose 135, protein 5.9, albumin  3.1.  CBC within normal limits.  PT, PTT, INR within normal limits.  Ethanol level negative.   ED Course: CT head showed no acute abnormality, did show extensive chronic encephalomalacia and old infarct.  CTA head and neck showed no LVO, no significant stenosis, no aneurysm.  Did show nonobstructive thrombotic disease at the left pulmonary artery concerning for PE with recommendation for follow-up CTA PE.  Neurology was consulted and patient was started on stroke dose heparin .  Review of Systems: Patient unable to participate in review of systems.  Past Medical History:  Diagnosis Date   A-fib [facility])    a. Post-op afib after CABG 08/2012.   Acute gastric ulcer with hemorrhage    Acute respiratory failure with hypoxia (HCC)    Aphasia following cerebral infarction    CAD (coronary artery disease)    a. NSTEMI s/p stent to distal RCA 03/2000. b. Inf MI s/p emergent thrombectomy/stenting mid RCA 10/2000. c. NSTEMI s/p CABGx4  (LIMA-LAD, SVG-OM2, seq SVG-acute marginal and PD) 08/16/12 - course complicated by confusion, post-op AF, L pleural effusion with thoracentesis. d. Normal LV function by echo 08/2012.   Diverticulosis    DM with CKD    Dysphagia following cerebral infarction    Encephalopathy    Extended spectrum beta lactamase (ESBL) resistance    Generalized anxiety disorder    GERD (gastroesophageal reflux disease)    Hemiplegia and hemiparesis following cerebral infarction affecting right dominant side (HCC)    Hiatal hernia    HLD (hyperlipidemia)    HTN (hypertension)    Non-ST elevation (NSTEMI) myocardial infarction [facility])    Nontraumatic intracerebral hemorrhage in hemisphere, subcortical (HCC)    Peripheral vascular disease    a. Carotid dopplers neg 08/13/12. b. Pre-cabg ABIs - R=0.87 suggesting mild dz, L=1.29 possibly falsely elevated due to calcified vessels.   Pleural effusion    a. L pleural eff after CABG s/p thoracentesis 08/22/12.   Prostate cancer [facility])    Status post radiation treatment.   Prostatic hypertrophy    a. Hx of urinary retention, awaiting TURP.   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 Mild  MR by TEE 08/2012.    Past Surgical History:  Procedure Laterality Date   APPENDECTOMY     BACK SURGERY     CORONARY ARTERY BYPASS GRAFT  08/16/2012   Procedure: CORONARY ARTERY BYPASS GRAFTING (CABG);  Surgeon: Elspeth JAYSON Millers, MD;  Location: [facility] OR;  Service: Open Heart Surgery;  Laterality: N/A;   IR REPLC GASTRO/COLONIC TUBE PERCUT W/FLUORO  02/13/2019   LEFT HEART CATHETERIZATION WITH CORONARY ANGIOGRAM N/A 08/14/2012   Procedure: LEFT HEART CATHETERIZATION WITH CORONARY ANGIOGRAM;  Surgeon: Peter M Jordan, MD;  Location: [facility] CATH LAB;  Service: Cardiovascular;  Laterality: N/A;    Social History  reports that he quit smoking about 52 years ago. His smoking  use included cigarettes. He has never used smokeless tobacco. He reports that he does not drink alcohol and does not use drugs.  Allergies[1]  Family History  Problem Relation Age of Onset   Hypertension Mother   Reviewed on admission  Prior to Admission medications  Medication Sig Start Date End Date Taking? Authorizing Provider  acetaminophen  (TYLENOL ) 325 MG tablet Take 650 mg by mouth 2 (two) times daily.   Yes [provider]  amLODipine  (NORVASC ) 2.5 MG tablet Take 2.5 mg by mouth daily.   Yes [provider]  Baclofen 5 MG TABS Take 5 mg by mouth 4 (four) times daily.   Yes [provider]  Victorio Spanner Oil [facility]) OINT Apply 1 Application topically See admin instructions. Apply topicallly every shift to bilateral buttocks and sacrum   Yes [provider]  clotrimazole (LOTRIMIN) 1 % cream Apply 1 Application topically See admin instructions. Apply every shift as needed to head of penis   Yes [provider]  Eyelid Cleansers (OCUSOFT LID SCRUB ORIGINAL) PADS Apply 1 Application topically 2 (two) times daily as needed (to clean eye.).   Yes [provider]  FLUoxetine  (PROZAC ) 10 MG capsule Take 10 mg by mouth daily.   Yes [provider]  insulin  glargine (LANTUS ) 100 UNIT/ML injection Inject 24 Units into the skin daily.   Yes [provider]  insulin  lispro (HUMALOG ) 100 UNIT/ML cartridge Inject 5 Units into the skin See admin instructions. Inject 5 units into the skin with meals.   Yes [provider]  loratadine (CLARITIN) 10 MG tablet Take 10 mg by mouth daily as needed for allergies.   Yes [provider]  losartan (COZAAR) 25 MG tablet Take 25 mg by mouth daily.   Yes [provider]  Nutritional Supplements (ENSURE ENLIVE PO) Take 1 Can by mouth 2 (two) times daily.   Yes [provider]  rosuvastatin  (CRESTOR ) 10 MG tablet Take 10 mg by mouth at bedtime.   Yes  [provider]  sennosides-docusate sodium  (SENOKOT-S) 8.6-50 MG tablet Take 1 tablet by mouth in the morning and at bedtime.   Yes [provider]  Continuous Glucose Sensor (FREESTYLE LIBRE 3 SENSOR) MISC 1 Device by Does not apply route every 14 (fourteen) days. Place 1 sensor on the skin every 14 days. Use to check glucose continuously 07/19/23   Landy Barnie RAMAN, NP    Physical Exam: Vitals:   11/28/24 1215 11/28/24 1230 11/28/24 1300 11/28/24 1330  BP: 113/64 131/67 99/65 98/65   Pulse: 61 62 61 60  Resp: 20 12 16 16   Temp:      TempSrc:      SpO2: 98% 99% 95% 97%  Weight:      Height:        Physical Exam Constitutional:      General: He is not in acute distress.    Appearance: Normal appearance.  HENT:     Head: Normocephalic and atraumatic.     Mouth/Throat:     Mouth: Mucous membranes are moist.  Pharynx: Oropharynx is clear.  Eyes:     Extraocular Movements: Extraocular movements intact.     Pupils: Pupils are equal, round, and reactive to light.  Cardiovascular:     Rate and Rhythm: Normal rate and regular rhythm.     Pulses: Normal pulses.     Heart sounds: Normal heart sounds.  Pulmonary:     Effort: Pulmonary effort is normal. No respiratory distress.     Breath sounds: Normal breath sounds.  Abdominal:     General: Bowel sounds are normal. There is no distension.     Palpations: Abdomen is soft.     Tenderness: There is no abdominal tenderness.  Musculoskeletal:        General: No swelling or deformity.  Skin:    General: Skin is warm and dry.  Neurological:     Comments: Patient drowsy and unable to significantly participate in exam on my evaluation.    Labs on Admission: I have personally reviewed following labs and imaging studies  CBC: Recent Labs  Lab 11/28/24 1106 11/28/24 1111  WBC  --  9.9  NEUTROABS  --  5.4  HGB 16.7 16.4  HCT 49.0 50.7  MCV  --  95.1  PLT  --  182    Basic Metabolic Panel: Recent Labs  Lab  11/28/24 1106 11/28/24 1150  NA 143 140  K 5.3* 4.4  CL 106 106  CO2  --  28  GLUCOSE 153* 135*  BUN 54* 41*  CREATININE 2.10* 1.90*  CALCIUM   --  9.4    GFR: Estimated Creatinine Clearance: 30.5 mL/min (A) (by C-G formula based on SCr of 1.9 mg/dL (H)).  Liver Function Tests: Recent Labs  Lab 11/28/24 1150  AST 23  ALT 15  ALKPHOS 96  BILITOT 0.6  PROT 5.9*  ALBUMIN  3.1*    Urine analysis:    Component Value Date/Time   COLORURINE YELLOW 05/04/2023 0310   APPEARANCEUR CLOUDY (A) 05/04/2023 0310   LABSPEC 1.016 05/04/2023 0310   PHURINE 5.0 05/04/2023 0310   GLUCOSEU NEGATIVE 05/04/2023 0310   HGBUR MODERATE (A) 05/04/2023 0310   BILIRUBINUR NEGATIVE 05/04/2023 0310   KETONESUR NEGATIVE 05/04/2023 0310   PROTEINUR 100 (A) 05/04/2023 0310   UROBILINOGEN 1.0 08/23/2012 1732   NITRITE NEGATIVE 05/04/2023 0310   LEUKOCYTESUR LARGE (A) 05/04/2023 0310    Radiological Exams on Admission: CT ANGIO HEAD NECK W WO CM (CODE STROKE) Result Date: 11/28/2024 EXAM: CTA HEAD AND NECK WITH AND WITHOUT 11/28/2024 11:20:00 AM TECHNIQUE: CTA of the head and neck was performed with and without the administration of intravenous contrast. Multiplanar 2D and/or 3D reformatted images are provided for review. Automated exposure control, iterative reconstruction, and/or weight based adjustment of the mA/kV was utilized to reduce the radiation dose to as low as reasonably achievable. Stenosis of the internal carotid arteries measured using NASCET criteria. COMPARISON: CT of the head dated 11/28/2024. CLINICAL HISTORY: Neuro deficit, acute, stroke suspected. FINDINGS: CTA NECK: AORTIC ARCH AND ARCH VESSELS: Mild calcific plaque within the thoracic aorta. No dissection or arterial injury. No significant stenosis of the brachiocephalic or subclavian arteries. CERVICAL CAROTID ARTERIES: Minimal calcific plaque within the right carotid bulb. No dissection, arterial injury, or hemodynamically  significant stenosis by NASCET criteria. CERVICAL VERTEBRAL ARTERIES: No dissection, arterial injury, or significant stenosis. LUNGS AND MEDIASTINUM: Significant incidental finding of nonobstructive thromboembolic disease along the posterior wall of the left pulmonary artery. A follow up CT pulmonary arteriogram of the chest is recommended. SOFT  TISSUES: No acute abnormality. BONES: No acute abnormality. CTA HEAD: ANTERIOR CIRCULATION: Calcific plaque within the carotid siphons, with less than 10% luminal stenosis present bilaterally. No significant stenosis of the anterior cerebral arteries. No significant stenosis of the middle cerebral arteries. No aneurysm. POSTERIOR CIRCULATION: No significant stenosis of the posterior cerebral arteries. No significant stenosis of the basilar artery. No significant stenosis of the vertebral arteries. No aneurysm. OTHER: No dural venous sinus thrombosis on this non-dedicated study. COMMUNICATION: The above findings and recommendations were communicated to Doctor Michaela at 11:37 AM 11/28/2024. IMPRESSION: 1. No large vessel occlusion, hemodynamically significant stenosis, or aneurysm in the head or neck. 2. Nonobstructive thromboembolic disease along the posterior wall of the left pulmonary artery. Follow-up CT pulmonary arteriogram of the chest is recommended. Findings and recommendations were communicated to Dr. Michaela at 11:37 AM on 11/28/24. Electronically signed by: Evalene Coho MD 11/28/2024 11:41 AM EST RP Workstation: [id number]   CT HEAD CODE STROKE WO CONTRAST Result Date: 11/28/2024 EXAM: CT HEAD WITHOUT CONTRAST 11/28/2024 11:09:00 AM TECHNIQUE: CT of the head was performed without the administration of intravenous contrast. Automated exposure control, iterative reconstruction, and/or weight based adjustment of the mA/kV was utilized to reduce the radiation dose to as low as reasonably achievable. COMPARISON: CT of the head dated 12/08/2022. CLINICAL  HISTORY: Neuro deficit, acute, stroke suspected. FINDINGS: BRAIN AND VENTRICLES: No acute hemorrhage. No hydrocephalus. No extra-axial collection. No mass effect or midline shift. There is age-related volume loss. Extensive encephalomalacia changes are present within the left cerebral hemisphere involving the left posterior frontal, left parietal, occipital, and medial temporal lobes. There is Wallerian degeneration of the left cerebral peduncle. There is a chronic lacunar infarct within the right thalamus. Moderate atheromatous calcification is noted within the carotid siphons. The above findings were communicated to Doctor Michaela at 11:18 AM 11/28/2024. [place] Stroke Program Early CT Score (ASPECTS) Ganglionic (caudate, IC, lentiform nucleus, insula, M1-M3): 7 Supraganglionic (M4-M6): 3 Total: 10 ORBITS: No acute abnormality. SINUSES: No acute abnormality. SOFT TISSUES AND SKULL: No acute soft tissue abnormality. No skull fracture. IMPRESSION: 1. No acute intracranial abnormality in the setting of suspected acute stroke. 2. Extensive chronic encephalomalacia throughout the left cerebral hemisphere (posterior frontal, parietal, occipital, and medial temporal lobes) with Wallerian degeneration of the left cerebral peduncle. 3. Chronic right thalamic lacunar infarct. 4. Moderate atheromatous calcification of the carotid siphons. 5. aspects: 10 Electronically signed by: Evalene Coho MD 11/28/2024 11:21 AM EST RP Workstation: [id number]   EKG: Independently reviewed. Sinus rhythm at 66 beats minute.  Nonspecific T wave changes.  Possible S1Q3T3 which would be consistent with PE however this appears to be chronic changes.  Assessment/Plan Principal Problem:   Pulmonary embolism (HCC) Active Problems:   CAD (coronary artery disease)   Peripheral vascular disease (HCC)   Atrial fibrillation with normal ventricular rate (HCC)   History of hemorrhagic cerebrovascular accident (CVA) with residual  deficit   Hypertension associated with diabetes (HCC)   Dyslipidemia associated with type 2 diabetes mellitus (HCC)   Type 2 diabetes mellitus with peripheral vascular disease (HCC)   Major depression with psychotic features (HCC)   RLS (restless legs syndrome)   Vascular dementia without behavioral disturbance (HCC)   GERD without esophagitis   Stage 3b chronic kidney disease (HCC)   Focal neurological deficit   Focal neurologic deficits History of CVA > History of prior hemorrhagic CVA and prior infarct. > Baseline right-sided deficits. > Today at facility found to have decreased left-sided function and  was less interactive > Brought as a code stroke, seen by neurology.  CT head showed old infarct and chronic encephalomalacia from prior stroke. > CTA head neck did not show LVO, significant stenosis, aneurysm but did show evidence of PE; addressed below. - Neurology consult - Allow for permissive HTN (systolic < 220 and diastolic < 120)  - On heparin  as below - Continue home statin   - Echocardiogram  - MR Brain - A1C  - Lipid panel  - Tele monitoring  - SLP eval (has baseline dysphagia issues prior to this event) - PT/OT  Acute PE History of DVT > History of DVT, previously on Eliquis .  Completed course has been off Eliquis  since 2023. > Incidental PE noted on CTA head and neck performed for workup of possible stroke as above. > Started on stroke dose heparin  in the ED. - Monitoring on telemetry - Continue with heparin  - Echocardiogram - Will benefit from CTA PE study, however this has been delayed due to CKD and already having angiogram today.  Already started on heparin , based off of finding on CTA head and neck.  Hypertension - Permissive hypertension as above  Hyperlipidemia - Continue rosuvastatin   Diabetes > 24 units daily at home. - 10 units daily, SSI  CKD 3B > Creatinine stable at 1.9 in the ED. > CTA head and neck as above.  Will need CTA PE study but  will wait to reduce contrast burden. - Trend renal function and electrolytes  PAD - On heparin  for now  CAD - On heparin  for now - Continue home rosuvastatin   A-fib - On heparin  for now  Depression Dementia RLS - Noted  DVT prophylaxis: Heparin  Code Status:   DNR, okay with intubation as long as it is temporary Family Communication:  Updated at bedside  Disposition Plan:   Patient is from:  [facility]  Anticipated DC to:  Same as above  Anticipated DC date:  2 to 3 days  Anticipated DC barriers: None  Consults called:  Neurology Admission status:  Inpatient, telemetry  Severity of Illness: The appropriate patient status for this patient is INPATIENT. Inpatient status is judged to be reasonable and necessary in order to provide the required intensity of service to ensure the patient's safety. The patient's presenting symptoms, physical exam findings, and initial radiographic and laboratory data in the context of their chronic comorbidities is felt to place them at high risk for further clinical deterioration. Furthermore, it is not anticipated that the patient will be medically stable for discharge from the hospital within 2 midnights of admission.   * I certify that at the point of admission it is my clinical judgment that the patient will require inpatient hospital care spanning beyond 2 midnights from the point of admission due to high intensity of service, high risk for further deterioration and high frequency of surveillance required.DEWAINE Marsa KATHEE Seena MD [facility]  How to contact the [facility] Attending or Consulting provider 7A - 7P or covering provider during after hours 7P -7A, for this patient?   Check the care team in [facility] and look for a) attending/consulting [facility] provider listed and b) the [facility] team listed Log into [email or web address] and use [facility] universal password to access. If you do not have the password, please contact the hospital  operator. Locate the [facility] provider you are looking for under [facility] and page to a number that you can be directly reached. If you still have difficulty reaching  the provider, please page the [facility] (Director on Call) for the Hospitalists listed on amion for assistance.  11/28/2024, 2:07 PM       [1] No Known Allergies

## 2024-11-28 NOTE — Consult Note (Signed)
 NEUROLOGY CONSULT NOTE   Date of service: November 28, 2024 Patient Name: [name] MRN:  [id number] DOB:  Dec 29, 1945 Chief Complaint: Left-sided weakness and drooling Requesting Provider: Seena Marsa NOVAK, MD  History of Present Illness  [name] is a 78 y.o. male with hx of previous stroke resulting in right-sided weakness and aphasia who presents with left-sided weakness and drooling.  LKW: 9 AM Modified rankin score: 5-Severe disability-bedridden, incontinent, needs constant attention IV Thrombolysis:  No, previous ICH EVT: No, no LVO and poor MR S  NIHSS components Score: Comment  1a Level of Conscious 0[x]  1[]  2[]  3[]      1b LOC Questions 0[]  1[]  2[x]       1c LOC Commands 0[]  1[x]  2[]       2 Best Gaze 0[]  1[x]  2[]       3 Visual 0[]  1[]  2[x]  3[]      4 Facial Palsy 0[]  1[x]  2[]  3[]      5a Motor Arm - left 0[x]  1[]  2[]  3[]  4[]  UN[]    5b Motor Arm - Right 0[]  1[]  2[]  3[x]  4[]  UN[]    6a Motor Leg - Left 0[]  1[]  2[x]  3[]  4[]  UN[]    6b Motor Leg - Right 0[]  1[]  2[]  3[x]  4[]  UN[]    7 Limb Ataxia 0[x]  1[]  2[]  UN[]      8 Sensory 0[x]  1[]  2[]  UN[]      9 Best Language 0[]  1[]  2[]  3[x]      10 Dysarthria 0[]  1[]  2[x]  UN[]      11 Extinct. and Inattention 0[x]  1[]  2[]       TOTAL: 19      Past History   Past Medical History:  Diagnosis Date   A-fib (HCC)    a. Post-op afib after CABG 08/2012.   Acute gastric ulcer with hemorrhage    Acute respiratory failure with hypoxia (HCC)    Aphasia following cerebral infarction    CAD (coronary artery disease)    a. NSTEMI s/p stent to distal RCA 03/2000. b. Inf MI s/p emergent thrombectomy/stenting mid RCA 10/2000. c. NSTEMI s/p CABGx4 (LIMA-LAD, SVG-OM2, seq SVG-acute marginal and PD) 08/16/12 - course complicated by confusion, post-op AF, L pleural effusion with thoracentesis. d. Normal LV function by echo 08/2012.   Diverticulosis    DM with CKD    Dysphagia following cerebral infarction    Encephalopathy    Extended spectrum  beta lactamase (ESBL) resistance    Generalized anxiety disorder    GERD (gastroesophageal reflux disease)    Hemiplegia and hemiparesis following cerebral infarction affecting right dominant side (HCC)    Hiatal hernia    HLD (hyperlipidemia)    HTN (hypertension)    Non-ST elevation (NSTEMI) myocardial infarction [facility])    Nontraumatic intracerebral hemorrhage in hemisphere, subcortical (HCC)    Peripheral vascular disease    a. Carotid dopplers neg 08/13/12. b. Pre-cabg ABIs - R=0.87 suggesting mild dz, L=1.29 possibly falsely elevated due to calcified vessels.   Pleural effusion    a. L pleural eff after CABG s/p thoracentesis 08/22/12.   Prostate cancer [facility])    Status post radiation treatment.   Prostatic hypertrophy    a. Hx of urinary retention, awaiting TURP.   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 Mild  MR by TEE 08/2012.    Past Surgical History:  Procedure Laterality Date   APPENDECTOMY  BACK SURGERY     CORONARY ARTERY BYPASS GRAFT  08/16/2012   Procedure: CORONARY ARTERY BYPASS GRAFTING (CABG);  Surgeon: Elspeth JAYSON Millers, MD;  Location: [facility] OR;  Service: Open Heart Surgery;  Laterality: N/A;   IR REPLC GASTRO/COLONIC TUBE PERCUT W/FLUORO  02/13/2019   LEFT HEART CATHETERIZATION WITH CORONARY ANGIOGRAM N/A 08/14/2012   Procedure: LEFT HEART CATHETERIZATION WITH CORONARY ANGIOGRAM;  Surgeon: Peter M Jordan, MD;  Location: [facility] CATH LAB;  Service: Cardiovascular;  Laterality: N/A;    Family History: Family History  Problem Relation Age of Onset   Hypertension Mother     Social History  reports that he quit smoking about 52 years ago. His smoking use included cigarettes. He has never used smokeless tobacco. He reports that he does not drink alcohol and does not use drugs.  Allergies[1]  Medications  Current Medications[2]  Vitals   Vitals:    12/12/24 1300 12-Dec-2024 1330 2024-12-12 1622 12-12-24 1635  BP: 99/65 98/65 123/68   Pulse: 61 60 62   Resp: 16 16 15    Temp:    (!) 96.9 F (36.1 C)  TempSrc:    Axillary  SpO2: 95% 97% 99%   Weight:      Height:        Body mass index is 24.58 kg/m.   Physical Exam   Constitutional: Appears chronically ill Neurologic Examination    Neuro: Mental Status: Patient is awake, but densely aphasic.  He does not speak, and the only command that I got him to follow was to make a fist. Cranial Nerves: II: He blinks to threat from the left but not right. III,IV, VI: Left gaze preference VII: Facial movement with left facial weakness Motor: He has a chronic right hemiparesis, he is able to lift his left arm against gravity, he also lift his left leg but does not keep it aloft but I think this is more understanding Sensory: He response to noxious stimulation x four Cerebellar: Does not perform        Labs/Imaging/Neurodiagnostic studies   CBC:  Recent Labs  Lab 2024/12/12 1106 12-12-2024 1111  WBC  --  9.9  NEUTROABS  --  5.4  HGB 16.7 16.4  HCT 49.0 50.7  MCV  --  95.1  PLT  --  182   Basic Metabolic Panel:  Lab Results  Component Value Date   NA 140 2024-12-12   K 4.4 12-Dec-2024   CO2 28 12-12-24   GLUCOSE 135 (H) 12-12-24   BUN 41 (H) 2024-12-12   CREATININE 1.90 (H) 12-12-24   CALCIUM  9.4 12/12/24   GFRNONAA 35 (L) Dec 12, 2024   GFRAA 56 (L) 05/25/2020   Lipid Panel:  Lab Results  Component Value Date   LDLCALC 114 (H) 04/25/2024   HgbA1c:  Lab Results  Component Value Date   HGBA1C 7.5 (H) 09/26/2024   Urine Drug Screen: No results found for: LABOPIA, COCAINSCRNUR, LABBENZ, AMPHETMU, THCU, LABBARB  Alcohol Level     Component Value Date/Time   [facility] <15 12-12-2024 1111   INR  Lab Results  Component Value Date   INR 1.1 2024/12/12   APTT  Lab Results  Component Value Date   APTT 30 12-Dec-2024   CT Head without  contrast(Personally reviewed): Only sequela of chronic injury  CT angio Head and Neck with contrast(Personally reviewed): Negative   ASSESSMENT   [name] is a 78 y.o. male with history of previous intracranial hemorrhage who presents with left facial weakness and  drooling and left gaze preference.  Strongly suspect that he has now had an acute ischemic stroke, and he will need to be admitted for further evaluation.     He does have a pulmonary embolus on his CTA, and though I typically do try to avoid heparin , in the setting of an acute pulmonary embolus, I think that the risks of not treating this are higher than the risks of hemorrhagic conversion.  I would favor, therefore starting heparin , but avoiding bolus.  RECOMMENDATIONS  - HgbA1c, fasting lipid panel - MRI of the brain without contrast - Frequent neuro checks - Echocardiogram - Prophylactic therapy: Stroke protocol heparin  - Risk factor modification - Telemetry monitoring - PT consult, OT consult, Speech consult - Stroke team to follow  ______________________________________________________________________    Signed, Aisha Seals, MD [facility]     [1] No Known Allergies [2]  Current Facility-Administered Medications:    [START ON 11/29/2024]  stroke: early stages of recovery book, , Does not apply, Once, Seena Marsa NOVAK, MD   0.9 %  sodium chloride  infusion, , Intravenous, Continuous, Melvin, Alexander B, MD   acetaminophen  (TYLENOL ) tablet 650 mg, 650 mg, Oral, Q4H PRN **OR** acetaminophen  (TYLENOL ) 160 MG/5ML solution 650 mg, 650 mg, Per Tube, Q4H PRN **OR** acetaminophen  (TYLENOL ) suppository 650 mg, 650 mg, Rectal, Q4H PRN, Melvin, Alexander B, MD   [START ON 11/29/2024] FLUoxetine  (PROZAC ) capsule 10 mg, 10 mg, Oral, Daily, Melvin, Alexander B, MD   heparin  ADULT infusion 100 units/mL (25000 units/250mL), 850 Units/hr, Intravenous, Continuous, Seena Marsa NOVAK, MD, Last Rate:  8.5 mL/hr at 11/28/24 1300, 850 Units/hr at 11/28/24 1300   insulin  aspart (novoLOG ) injection 0-15 Units, 0-15 Units, Subcutaneous, Q4H, Melvin, Alexander B, MD   [START ON 11/29/2024] insulin  glargine (LANTUS ) injection 10 Units, 10 Units, Subcutaneous, Daily, Melvin, Alexander B, MD   rosuvastatin  (CRESTOR ) tablet 10 mg, 10 mg, Oral, QHS, Melvin, Alexander B, MD   senna-docusate (Senokot-S) tablet 1 tablet, 1 tablet, Oral, QHS PRN, Melvin, Alexander B, MD  Current Outpatient Medications:    acetaminophen  (TYLENOL ) 325 MG tablet, Take 650 mg by mouth 2 (two) times daily., Disp: , Rfl:    amLODipine  (NORVASC ) 2.5 MG tablet, Take 2.5 mg by mouth daily., Disp: , Rfl:    Baclofen 5 MG TABS, Take 5 mg by mouth 4 (four) times daily., Disp: , Rfl:    Balsam [place]-Castor Oil (VENELEX) OINT, Apply 1 Application topically See admin instructions. Apply topicallly every shift to bilateral buttocks and sacrum, Disp: , Rfl:    clotrimazole (LOTRIMIN) 1 % cream, Apply 1 Application topically See admin instructions. Apply every shift as needed to head of penis, Disp: , Rfl:    Eyelid Cleansers (OCUSOFT LID SCRUB ORIGINAL) PADS, Apply 1 Application topically 2 (two) times daily as needed (to clean eye.)., Disp: , Rfl:    FLUoxetine  (PROZAC ) 10 MG capsule, Take 10 mg by mouth daily., Disp: , Rfl:    insulin  glargine (LANTUS ) 100 UNIT/ML injection, Inject 24 Units into the skin daily., Disp: , Rfl:    insulin  lispro (HUMALOG ) 100 UNIT/ML cartridge, Inject 5 Units into the skin See admin instructions. Inject 5 units into the skin with meals., Disp: , Rfl:    loratadine (CLARITIN) 10 MG tablet, Take 10 mg by mouth daily as needed for allergies., Disp: , Rfl:    losartan (COZAAR) 25 MG tablet, Take 25 mg by mouth daily., Disp: , Rfl:    Nutritional Supplements (ENSURE ENLIVE PO), Take 1 Can  by mouth 2 (two) times daily., Disp: , Rfl:    rosuvastatin  (CRESTOR ) 10 MG tablet, Take 10 mg by mouth at bedtime., Disp: , Rfl:     sennosides-docusate sodium  (SENOKOT-S) 8.6-50 MG tablet, Take 1 tablet by mouth in the morning and at bedtime., Disp: , Rfl:    Continuous Glucose Sensor (FREESTYLE LIBRE 3 SENSOR) MISC, 1 Device by Does not apply route every 14 (fourteen) days. Place 1 sensor on the skin every 14 days. Use to check glucose continuously, Disp: , Rfl:

## 2024-11-28 NOTE — Code Documentation (Signed)
 Stroke Response Nurse Documentation Code Documentation  [name] is a 78 y.o. male arriving to [facility]  via [facility] on 12/18 with past medical hx of ICH, afib, CAD, DM, HTN, HLD. On No antithrombotic. Code stroke was activated by EMS.   Patient from the [facility] where he was LKW at 0900 and now complaining of L sided weakness and drooling. Patient woke up in his normal state of health, had a bath, ate breakfast, and then staff found him with new deficits.    Stroke team at the bedside on patient arrival. Labs drawn and patient cleared for CT by Dr. Patsey. Patient to CT with team. NIHSS 19, see documentation for details and code stroke times. Patient with disoriented, not following commands, left gaze preference , right hemianopia, left facial droop, bilateral arm weakness, bilateral leg weakness, Expressive aphasia , and dysarthria  on exam. The following imaging was completed:  CT Head and CTA. Patient is not a candidate for IV Thrombolytic due to previous ICH. Patient is not a candidate for IR due to baseline mRs.   Care Plan: q2 NIHSS and vitals, swallow screen before any PO intake.   Bedside handoff with ED RN Chiquita.    Lauraine LITTIE Searle  Stroke Response RN

## 2024-11-28 NOTE — ED Notes (Signed)
 MD notified of failed swallow

## 2024-11-28 NOTE — Progress Notes (Signed)
 PHARMACY - ANTICOAGULATION CONSULT NOTE  Pharmacy Consult for Heparin  Indication: pulmonary embolus  Allergies[1]  Patient Measurements: Height: 5' 8.5 (174 cm) Weight: 74.4 kg (164 lb 0.4 oz) IBW/kg (Calculated) : 69.55 HEPARIN  DW (KG): 74.4  Vital Signs: Temp: 98 F (36.7 C) (12/18 1945) Temp Source: Oral (12/18 1945) BP: 137/82 (12/18 2200) Pulse Rate: 81 (12/18 2201)  Labs: Recent Labs    11/28/24 1106 11/28/24 1111 11/28/24 1150 11/28/24 2129  HGB 16.7 16.4  --   --   HCT 49.0 50.7  --   --   PLT  --  182  --   --   APTT  --  30  --   --   LABPROT  --  14.8  --   --   INR  --  1.1  --   --   HEPARINUNFRC  --   --   --  1.01*  CREATININE 2.10*  --  1.90*  --     Estimated Creatinine Clearance: 30.5 mL/min (A) (by C-G formula based on SCr of 1.9 mg/dL (H)).   Medical History: Past Medical History:  Diagnosis Date   A-fib [facility])    a. Post-op afib after CABG 08/2012.   Acute gastric ulcer with hemorrhage    Acute respiratory failure with hypoxia (HCC)    Aphasia following cerebral infarction    CAD (coronary artery disease)    a. NSTEMI s/p stent to distal RCA 03/2000. b. Inf MI s/p emergent thrombectomy/stenting mid RCA 10/2000. c. NSTEMI s/p CABGx4 (LIMA-LAD, SVG-OM2, seq SVG-acute marginal and PD) 08/16/12 - course complicated by confusion, post-op AF, L pleural effusion with thoracentesis. d. Normal LV function by echo 08/2012.   Diverticulosis    DM with CKD    Dysphagia following cerebral infarction    Encephalopathy    Extended spectrum beta lactamase (ESBL) resistance    Generalized anxiety disorder    GERD (gastroesophageal reflux disease)    Hemiplegia and hemiparesis following cerebral infarction affecting right dominant side (HCC)    Hiatal hernia    HLD (hyperlipidemia)    HTN (hypertension)    Non-ST elevation (NSTEMI) myocardial infarction [facility])    Nontraumatic intracerebral hemorrhage in hemisphere, subcortical (HCC)    Peripheral vascular  disease    a. Carotid dopplers neg 08/13/12. b. Pre-cabg ABIs - R=0.87 suggesting mild dz, L=1.29 possibly falsely elevated due to calcified vessels.   Pleural effusion    a. L pleural eff after CABG s/p thoracentesis 08/22/12.   Prostate cancer [facility])    Status post radiation treatment.   Prostatic hypertrophy    a. Hx of urinary retention, awaiting TURP.   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 Mild  MR by TEE 08/2012.    Medications:  See home med rec  Assessment: 78 y.o. M presents with stroke. Noted pt with h/o ICH. CTA shows nonobstructive thromboembolic disease along the posterior wall of the left.pulmonary artery. Neurology okay with starting heparin  - low goal, no bolus. No AC PTA. CBC ok on admission.  Heparin  level back supratherapeutic, confirmed lab draw with RN, straight stick  Goal of Therapy:  Heparin  level 0.3-0.5 units/ml Monitor platelets by anticoagulation protocol: Yes   Plan:  Hold heparin  gtt x 1h, then resume at reduced rate of 700 units/hr F/u 8 hour heparin  level after re-start  Dorn Poot,  PharmD, [facility] Clinical Pharmacist ED Pharmacist Phone # [phone] 11/28/2024 10:19 PM        [1] No Known Allergies

## 2024-11-28 NOTE — Progress Notes (Signed)
° °  Brief Progress Note   _____________________________________________________________________________________________________________  Patient Name: [name] Patient DOB: 04/18/1946 Date: @TODAY @      Data: Reviewed vital signs, labs, and clinical notes.    Action: No action required at this time.    Response:  Awaiting bed assignment.  _____________________________________________________________________________________________________________  [facility] RN Expeditor Aileen Amore S Latesia Norrington Please contact us  directly via secure chat (search for [facility]) or by calling us  at [phone] [facility]).

## 2024-11-28 NOTE — Progress Notes (Signed)
 PHARMACY - ANTICOAGULATION CONSULT NOTE  Pharmacy Consult for Heparin  Indication: pulmonary embolus  Allergies[1]  Patient Measurements: Height: 5' 8.5 (174 cm) Weight: 74.4 kg (164 lb 0.4 oz) IBW/kg (Calculated) : 69.55 HEPARIN  DW (KG): 74.4  Vital Signs: Temp: 98.1 F (36.7 C) (12/18 1131) Temp Source: Oral (12/18 1131) BP: 140/70 (12/18 1131) Pulse Rate: 66 (12/18 1131)  Labs: Recent Labs    11/28/24 1106 11/28/24 1111 11/28/24 1150  HGB 16.7 16.4  --   HCT 49.0 50.7  --   PLT  --  182  --   APTT  --  30  --   LABPROT  --  14.8  --   INR  --  1.1  --   CREATININE 2.10*  --  1.90*    Estimated Creatinine Clearance: 30.5 mL/min (A) (by C-G formula based on SCr of 1.9 mg/dL (H)).   Medical History: Past Medical History:  Diagnosis Date   A-fib [facility])    a. Post-op afib after CABG 08/2012.   Acute gastric ulcer with hemorrhage    Acute respiratory failure with hypoxia (HCC)    Aphasia following cerebral infarction    CAD (coronary artery disease)    a. NSTEMI s/p stent to distal RCA 03/2000. b. Inf MI s/p emergent thrombectomy/stenting mid RCA 10/2000. c. NSTEMI s/p CABGx4 (LIMA-LAD, SVG-OM2, seq SVG-acute marginal and PD) 08/16/12 - course complicated by confusion, post-op AF, L pleural effusion with thoracentesis. d. Normal LV function by echo 08/2012.   Diverticulosis    DM with CKD    Dysphagia following cerebral infarction    Encephalopathy    Extended spectrum beta lactamase (ESBL) resistance    Generalized anxiety disorder    GERD (gastroesophageal reflux disease)    Hemiplegia and hemiparesis following cerebral infarction affecting right dominant side (HCC)    Hiatal hernia    HLD (hyperlipidemia)    HTN (hypertension)    Non-ST elevation (NSTEMI) myocardial infarction [facility])    Nontraumatic intracerebral hemorrhage in hemisphere, subcortical (HCC)    Peripheral vascular disease    a. Carotid dopplers neg 08/13/12. b. Pre-cabg ABIs - R=0.87 suggesting mild  dz, L=1.29 possibly falsely elevated due to calcified vessels.   Pleural effusion    a. L pleural eff after CABG s/p thoracentesis 08/22/12.   Prostate cancer [facility])    Status post radiation treatment.   Prostatic hypertrophy    a. Hx of urinary retention, awaiting TURP.   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 Mild  MR by TEE 08/2012.    Medications:  See home med rec  Assessment: 78 y.o. M presents with stroke. Noted pt with h/o ICH. CTA shows nonobstructive thromboembolic disease along the posterior wall of the left.pulmonary artery. Neurology okay with starting heparin  - low goal, no bolus. No AC PTA. CBC ok on admission.  Goal of Therapy:  Heparin  level 0.3-0.5 units/ml Monitor platelets by anticoagulation protocol: Yes   Plan:  Heparin  gtt at 850 units/hr. No bolus. Will f/u heparin  level in 8 hours Daily heparin  level and CBC  Vito Ralph, PharmD, BCPS Please see amion for complete clinical pharmacist phone list 11/28/2024,12:27 PM      [1] No Known Allergies

## 2024-11-28 NOTE — ED Triage Notes (Signed)
 Patient arrives via [facility] for code stroke as a LKW 0900. Patient with new onset left side weakness and drooling. Patient with hx of stroke. Patient intermittently verbal with EMS, response to pain and verbal. Patient was normal talking at breakfast eating and had a bath this am prior to new onset deficits.  16 LAC  EMS vitals 142/54 HR 60 94 on room air CBG 180

## 2024-11-28 NOTE — ED Provider Notes (Signed)
 [place] EMERGENCY DEPARTMENT AT [facility] Provider Note   CSN: [id number] Arrival date & time: 11/28/24  1058  An emergency department physician performed an initial assessment on this suspected stroke patient at 55.  Patient presents with: Code Stroke   [name] is a 78 y.o. male.   HPI Patient presented as a code stroke.  Met at [facility] by myself and [name].  Last normal at 9 AM.  Presents from nursing home.  Reportedly has decreased verbal from previous stroke and chronically weak on right side.  Did have left-sided facial droop and potentially decreased movement on left side.  Decreased verbal also.   Past Medical History:  Diagnosis Date   A-fib [facility])    a. Post-op afib after CABG 08/2012.   Acute gastric ulcer with hemorrhage    Acute respiratory failure with hypoxia (HCC)    Aphasia following cerebral infarction    CAD (coronary artery disease)    a. NSTEMI s/p stent to distal RCA 03/2000. b. Inf MI s/p emergent thrombectomy/stenting mid RCA 10/2000. c. NSTEMI s/p CABGx4 (LIMA-LAD, SVG-OM2, seq SVG-acute marginal and PD) 08/16/12 - course complicated by confusion, post-op AF, L pleural effusion with thoracentesis. d. Normal LV function by echo 08/2012.   Diverticulosis    DM with CKD    Dysphagia following cerebral infarction    Encephalopathy    Extended spectrum beta lactamase (ESBL) resistance    Generalized anxiety disorder    GERD (gastroesophageal reflux disease)    Hemiplegia and hemiparesis following cerebral infarction affecting right dominant side (HCC)    Hiatal hernia    HLD (hyperlipidemia)    HTN (hypertension)    Non-ST elevation (NSTEMI) myocardial infarction [facility])    Nontraumatic intracerebral hemorrhage in hemisphere, subcortical (HCC)    Peripheral vascular disease    a. Carotid dopplers neg 08/13/12. b. Pre-cabg ABIs - R=0.87 suggesting mild dz, L=1.29 possibly falsely elevated due to calcified vessels.   Pleural effusion     a. L pleural eff after CABG s/p thoracentesis 08/22/12.   Prostate cancer [facility])    Status post radiation treatment.   Prostatic hypertrophy    a. Hx of urinary retention, awaiting TURP.   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 Mild  MR by TEE 08/2012.    Prior to Admission medications  Medication Sig Start Date End Date Taking? Authorizing Provider  acetaminophen  (TYLENOL ) 325 MG tablet Take 650 mg by mouth 2 (two) times daily.    [provider]  amLODipine  (NORVASC ) 2.5 MG tablet Take 2.5 mg by mouth daily.    [provider]  baclofen (LIORESAL) 10 MG tablet Take 5 mg by mouth 4 (four) times daily.    [provider]  Victorio Sp[employer] [facility]) OINT Apply topically.    [provider]  BD AUTOSHIELD DUO 30G X 5 MM MISC 3/16 01/02/22   [provider]  clotrimazole (LOTRIMIN) 1 % cream Apply 1 Application topically as directed.  every shift as needed to head od penis As Needed; PRN 1, PRN 2, PRN 3    [provider]  Continuous Glucose Sensor (FREESTYLE LIBRE 3 SENSOR) MISC 1 Device by Does not apply route every 14 (fourteen) days. Place 1 sensor on the skin every 14 days. Use to check glucose continuously 07/19/23   Landy Sober  S, NP  Eyelid Cleansers (OCUSOFT LID SCRUB ORIGINAL) PADS Apply topically as needed. 1 pad, topical, As Needed, bid to eyes as needed to clean    [provider]  FLUoxetine  (PROZAC ) 10 MG capsule Take 10 mg by mouth daily.    [provider]  insulin  glargine (LANTUS ) 100 UNIT/ML injection Inject 16 Units into the skin daily.    [provider]  insulin  lispro (HUMALOG ) 100 UNIT/ML cartridge Inject 5 Units into the skin in the morning and at bedtime. Give 5 units  with breakfast and lunch give if he eats more than 50% of meals    [provider]   loratadine (CLARITIN) 10 MG tablet Take 10 mg by mouth daily as needed for allergies.    [provider]  losartan (COZAAR) 25 MG tablet Take 25 mg by mouth daily. DX: Hypertensive heart and chronic kidney disease without heart failure, with stage 1 through stage 4 chronic kidney disease, or unspecified chronic kidney disease-per NP note, code updated    [provider]  NON FORMULARY DIET: DYSPHAGIA 3 / THIN LIQUIDS. May have a baked sweet potato. FOOD IN BOWLS.  TEA FOR LUNCH. MILK FOR DINNER. Add sandwich and banana to lunch and dinner trays- Does not need to be cut up in fours. Special Instructions: NO STRAWS [DX: Dysphagia, oropharyngeal phase]  No pasta per family request 06/21/19   [provider]  Nutritional Supplements (ENSURE ENLIVE PO) Take 1 Can by mouth 2 (two) times daily.    [provider]  rosuvastatin  (CRESTOR ) 10 MG tablet Take 10 mg by mouth daily.    [provider]  sennosides-docusate sodium  (SENOKOT-S) 8.6-50 MG tablet Take 1 tablet by mouth in the morning and at bedtime.    [provider]    Allergies: Patient has no known allergies.    Review of Systems  Updated Vital Signs BP (!) 140/70 (BP Location: Left Arm)   Pulse 66   Temp 98.1 F (36.7 C) (Oral)   Resp 20   Ht 5' 8.5 (1.74 m)   Wt 74.4 kg   SpO2 99%   BMI 24.58 kg/m   Physical Exam Vitals and nursing note reviewed.  Cardiovascular:     Rate and Rhythm: Regular rhythm.  Abdominal:     Tenderness: There is no abdominal tenderness.  Neurological:     Comments: Eyes will not cross to the right side.  Initial mental status decreased with drooling.  Left-sided facial droop.  However with time did improve some.  Later would move left side more and would follow commands.  Will raise left hand for more than 10 seconds but still not able to lift left leg at normal strength.  Complete NIH scoring done by neurology.       (all labs ordered are  listed, but only abnormal results are displayed) Labs Reviewed  DIFFERENTIAL - Abnormal; Notable for the following components:      Result Value   Eosinophils Absolute 0.6 (*)    All other components within normal limits  COMPREHENSIVE METABOLIC PANEL WITH GFR - Abnormal; Notable for the following components:   Glucose, Bld 135 (*)    BUN 41 (*)    Creatinine, Ser 1.90 (*)    Total Protein 5.9 (*)    Albumin  3.1 (*)    GFR, Estimated 35 (*)    All other components within normal limits  CBG MONITORING, ED - Abnormal; Notable for the following components:   Glucose-Capillary  146 (*)    All other components within normal limits  I-STAT CHEM 8, ED - Abnormal; Notable for the following components:   Potassium 5.3 (*)    BUN 54 (*)    Creatinine, Ser 2.10 (*)    Glucose, Bld 153 (*)    All other components within normal limits  PROTIME-INR  APTT  CBC  ETHANOL  HEPARIN  LEVEL (UNFRACTIONATED)  CBG MONITORING, ED    EKG: None  Radiology: CT ANGIO HEAD NECK W WO CM (CODE STROKE) Result Date: 11/28/2024 EXAM: CTA HEAD AND NECK WITH AND WITHOUT 11/28/2024 11:20:00 AM TECHNIQUE: CTA of the head and neck was performed with and without the administration of intravenous contrast. Multiplanar 2D and/or 3D reformatted images are provided for review. Automated exposure control, iterative reconstruction, and/or weight based adjustment of the mA/kV was utilized to reduce the radiation dose to as low as reasonably achievable. Stenosis of the internal carotid arteries measured using NASCET criteria. COMPARISON: CT of the head dated 11/28/2024. CLINICAL HISTORY: Neuro deficit, acute, stroke suspected. FINDINGS: CTA NECK: AORTIC ARCH AND ARCH VESSELS: Mild calcific plaque within the thoracic aorta. No dissection or arterial injury. No significant stenosis of the brachiocephalic or subclavian arteries. CERVICAL CAROTID ARTERIES: Minimal calcific plaque within the right carotid bulb. No dissection,  arterial injury, or hemodynamically significant stenosis by NASCET criteria. CERVICAL VERTEBRAL ARTERIES: No dissection, arterial injury, or significant stenosis. LUNGS AND MEDIASTINUM: Significant incidental finding of nonobstructive thromboembolic disease along the posterior wall of the left pulmonary artery. A follow up CT pulmonary arteriogram of the chest is recommended. SOFT TISSUES: No acute abnormality. BONES: No acute abnormality. CTA HEAD: ANTERIOR CIRCULATION: Calcific plaque within the carotid siphons, with less than 10% luminal stenosis present bilaterally. No significant stenosis of the anterior cerebral arteries. No significant stenosis of the middle cerebral arteries. No aneurysm. POSTERIOR CIRCULATION: No significant stenosis of the posterior cerebral arteries. No significant stenosis of the basilar artery. No significant stenosis of the vertebral arteries. No aneurysm. OTHER: No dural venous sinus thrombosis on this non-dedicated study. COMMUNICATION: The above findings and recommendations were communicated to Doctor [name] at 11:37 AM 11/28/2024. IMPRESSION: 1. No large vessel occlusion, hemodynamically significant stenosis, or aneurysm in the head or neck. 2. Nonobstructive thromboembolic disease along the posterior wall of the left pulmonary artery. Follow-up CT pulmonary arteriogram of the chest is recommended. Findings and recommendations were communicated to [name] at 11:37 AM on 11/28/24. Electronically signed by: Evalene Coho MD 11/28/2024 11:41 AM EST RP Workstation: [id number]   CT HEAD CODE STROKE WO CONTRAST Result Date: 11/28/2024 EXAM: CT HEAD WITHOUT CONTRAST 11/28/2024 11:09:00 AM TECHNIQUE: CT of the head was performed without the administration of intravenous contrast. Automated exposure control, iterative reconstruction, and/or weight based adjustment of the mA/kV was utilized to reduce the radiation dose to as low as reasonably achievable. COMPARISON: CT of  the head dated 12/08/2022. CLINICAL HISTORY: Neuro deficit, acute, stroke suspected. FINDINGS: BRAIN AND VENTRICLES: No acute hemorrhage. No hydrocephalus. No extra-axial collection. No mass effect or midline shift. There is age-related volume loss. Extensive encephalomalacia changes are present within the left cerebral hemisphere involving the left posterior frontal, left parietal, occipital, and medial temporal lobes. There is Wallerian degeneration of the left cerebral peduncle. There is a chronic lacunar infarct within the right thalamus. Moderate atheromatous calcification is noted within the carotid siphons. The above findings were communicated to Doctor [name] at 11:18 AM 11/28/2024. [place] Stroke Program Early CT Score (ASPECTS) Ganglionic (caudate, IC, lentiform nucleus,  insula, M1-M3): 7 Supraganglionic (M4-M6): 3 Total: 10 ORBITS: No acute abnormality. SINUSES: No acute abnormality. SOFT TISSUES AND SKULL: No acute soft tissue abnormality. No skull fracture. IMPRESSION: 1. No acute intracranial abnormality in the setting of suspected acute stroke. 2. Extensive chronic encephalomalacia throughout the left cerebral hemisphere (posterior frontal, parietal, occipital, and medial temporal lobes) with Wallerian degeneration of the left cerebral peduncle. 3. Chronic right thalamic lacunar infarct. 4. Moderate atheromatous calcification of the carotid siphons. 5. aspects: 10 Electronically signed by: Evalene Coho MD 11/28/2024 11:21 AM EST RP Workstation: [id number]     Procedures   Medications Ordered in the ED  heparin  ADULT infusion 100 units/mL (25000 units/266mL) (has no administration in time range)  sodium chloride  flush (NS) 0.9 % injection 3 mL (3 mLs Intravenous Given 11/28/24 1110)  iohexol  (OMNIPAQUE ) 350 MG/ML injection 75 mL (75 mLs Intravenous Contrast Given 11/28/24 1121)                                    Medical Decision Making Amount and/or Complexity of Data  Reviewed Labs: ordered. Radiology: ordered.  Risk Prescription drug management.   Patient presented as a code stroke.  However found not to be a TNK candidate due to previous intracranial hemorrhage.  Also no LVO. Discussed with [name] and later with patient's wife at bedside.  Not hypoxic or tachycardic.  However on the CTA of the neck found to have pulmonary embolism.  Reviewing records it appears that he had previous extensive DVT of the right leg.  However had cleared up and on Apr 27, 2022 his Eliquis  was stopped.  Patient's creatinine is chronically elevated.  On i-STAT it was above 2.  On the CMP it was 1.9.  However did get a dose of contrast for the CTA.  I think for right now we will hold off on CT of the chest to evaluate for the PE.  Discussed with neurology and will heparinize under the stroke protocol.  Will likely need echocardiogram for both the stroke and the PE.  Will discuss with hospitalist for admission.  Reviewed patient's MOST form.  Is a DNR but would otherwise want full intervention.  CRITICAL CARE Performed by: [place] Total critical care time: 30 minutes Critical care time was exclusive of separately billable procedures and treating other patients. Critical care was necessary to treat or prevent imminent or life-threatening deterioration. Critical care was time spent personally by me on the following activities: development of treatment plan with patient and/or surrogate as well as nursing, discussions with consultants, evaluation of patient's response to treatment, examination of patient, obtaining history from patient or surrogate, ordering and performing treatments and interventions, ordering and review of laboratory studies, ordering and review of radiographic studies, pulse oximetry and re-evaluation of patient's condition.      Final diagnoses:  None    ED Discharge Orders     None          River Rankin, MD 11/28/24  1300

## 2024-11-29 ENCOUNTER — Inpatient Hospital Stay (HOSPITAL_COMMUNITY)

## 2024-11-29 ENCOUNTER — Other Ambulatory Visit: Payer: Self-pay

## 2024-11-29 ENCOUNTER — Encounter (HOSPITAL_COMMUNITY): Payer: Self-pay | Admitting: Internal Medicine

## 2024-11-29 DIAGNOSIS — Z794 Long term (current) use of insulin: Secondary | ICD-10-CM

## 2024-11-29 DIAGNOSIS — I4891 Unspecified atrial fibrillation: Secondary | ICD-10-CM | POA: Diagnosis not present

## 2024-11-29 DIAGNOSIS — G928 Other toxic encephalopathy: Secondary | ICD-10-CM | POA: Diagnosis not present

## 2024-11-29 DIAGNOSIS — I10 Essential (primary) hypertension: Secondary | ICD-10-CM

## 2024-11-29 DIAGNOSIS — I2690 Septic pulmonary embolism without acute cor pulmonale: Secondary | ICD-10-CM | POA: Diagnosis not present

## 2024-11-29 DIAGNOSIS — I639 Cerebral infarction, unspecified: Secondary | ICD-10-CM

## 2024-11-29 DIAGNOSIS — R569 Unspecified convulsions: Secondary | ICD-10-CM | POA: Diagnosis not present

## 2024-11-29 DIAGNOSIS — I69351 Hemiplegia and hemiparesis following cerebral infarction affecting right dominant side: Secondary | ICD-10-CM | POA: Diagnosis not present

## 2024-11-29 DIAGNOSIS — I69391 Dysphagia following cerebral infarction: Secondary | ICD-10-CM | POA: Diagnosis not present

## 2024-11-29 DIAGNOSIS — I2699 Other pulmonary embolism without acute cor pulmonale: Secondary | ICD-10-CM

## 2024-11-29 DIAGNOSIS — F03918 Unspecified dementia, unspecified severity, with other behavioral disturbance: Secondary | ICD-10-CM | POA: Diagnosis not present

## 2024-11-29 DIAGNOSIS — E119 Type 2 diabetes mellitus without complications: Secondary | ICD-10-CM | POA: Diagnosis not present

## 2024-11-29 LAB — ECHOCARDIOGRAM COMPLETE
Area-P 1/2: 3.31 cm2
Height: 68.5 in
S' Lateral: 2.7 cm
Weight: 2624.36 [oz_av]

## 2024-11-29 LAB — CBG MONITORING, ED
Glucose-Capillary: 83 mg/dL (ref 70–99)
Glucose-Capillary: 71 mg/dL (ref 70–99)
Glucose-Capillary: 198 mg/dL — ABNORMAL HIGH (ref 70–99)

## 2024-11-29 LAB — GLUCOSE, CAPILLARY
Glucose-Capillary: 162 mg/dL — ABNORMAL HIGH (ref 70–99)
Glucose-Capillary: 126 mg/dL — ABNORMAL HIGH (ref 70–99)
Glucose-Capillary: 78 mg/dL (ref 70–99)

## 2024-11-29 LAB — HEPARIN LEVEL (UNFRACTIONATED)
Heparin Unfractionated: 0.25 [IU]/mL — ABNORMAL LOW (ref 0.30–0.70)
Heparin Unfractionated: 0.2 [IU]/mL — ABNORMAL LOW (ref 0.30–0.70)

## 2024-11-29 MED ORDER — PERFLUTREN LIPID MICROSPHERE
1.0000 mL | INTRAVENOUS | Status: AC | PRN
  Administered 2024-11-29: 3 mL via INTRAVENOUS

## 2024-11-29 MED ORDER — POLYMYXIN B-TRIMETHOPRIM 10000-0.1 UNIT/ML-% OP SOLN
1.0000 [drp] | OPHTHALMIC | Status: DC
  Administered 2024-11-29 – 2024-12-02 (×17): 1 [drp] via OPHTHALMIC
  Filled 2024-11-29 (×2): qty 10

## 2024-11-29 NOTE — Progress Notes (Signed)
 PHARMACY - ANTICOAGULATION CONSULT NOTE  Pharmacy Consult for Heparin  Indication: pulmonary embolus  Allergies[1]  Patient Measurements: Height: 5' 10 (177.8 cm) Weight: 73.1 kg (161 lb 2.5 oz) IBW/kg (Calculated) : 73 HEPARIN  DW (KG): 73.1  Vital Signs: Temp: 98.3 F (36.8 C) (12/19 1817) Temp Source: Axillary (12/19 1817) BP: 143/80 (12/19 1817) Pulse Rate: 70 (12/19 1817)  Labs: Recent Labs    11/28/24 1106 11/28/24 1111 11/28/24 1150 11/28/24 2129 11/29/24 0936 11/29/24 1853  HGB 16.7 16.4  --   --   --   --   HCT 49.0 50.7  --   --   --   --   PLT  --  182  --   --   --   --   APTT  --  30  --   --   --   --   LABPROT  --  14.8  --   --   --   --   INR  --  1.1  --   --   --   --   HEPARINUNFRC  --   --   --  1.01* 0.25* 0.20*  CREATININE 2.10*  --  1.90*  --   --   --     Estimated Creatinine Clearance: 32 mL/min (A) (by C-G formula based on SCr of 1.9 mg/dL (H)).   Medical History: Past Medical History:  Diagnosis Date   A-fib [facility])    a. Post-op afib after CABG 08/2012.   Acute gastric ulcer with hemorrhage    Acute respiratory failure with hypoxia (HCC)    Aphasia following cerebral infarction    CAD (coronary artery disease)    a. NSTEMI s/p stent to distal RCA 03/2000. b. Inf MI s/p emergent thrombectomy/stenting mid RCA 10/2000. c. NSTEMI s/p CABGx4 (LIMA-LAD, SVG-OM2, seq SVG-acute marginal and PD) 08/16/12 - course complicated by confusion, post-op AF, L pleural effusion with thoracentesis. d. Normal LV function by echo 08/2012.   Diverticulosis    DM with CKD    Dysphagia following cerebral infarction    Encephalopathy    Extended spectrum beta lactamase (ESBL) resistance    Generalized anxiety disorder    GERD (gastroesophageal reflux disease)    Hemiplegia and hemiparesis following cerebral infarction affecting right dominant side (HCC)    Hiatal hernia    HLD (hyperlipidemia)    HTN (hypertension)    Non-ST elevation (NSTEMI) myocardial  infarction [facility])    Nontraumatic intracerebral hemorrhage in hemisphere, subcortical (HCC)    Peripheral vascular disease    a. Carotid dopplers neg 08/13/12. b. Pre-cabg ABIs - R=0.87 suggesting mild dz, L=1.29 possibly falsely elevated due to calcified vessels.   Pleural effusion    a. L pleural eff after CABG s/p thoracentesis 08/22/12.   Prostate cancer [facility])    Status post radiation treatment.   Prostatic hypertrophy    a. Hx of urinary retention, awaiting TURP.   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 Mild  MR by TEE 08/2012.    Medications:  See home med rec  Assessment: 78 y.o. M presents with stroke. Noted pt with h/o ICH. CTA shows nonobstructive thromboembolic disease along the posterior wall of the left.pulmonary artery. Neurology okay with starting heparin  - low goal, no bolus. No AC PTA. CBC ok on admission.  Heparin  level back slightly subtherapeutic (  0.2) on infusion at 750 units/hr. No s/sx of bleeding - IV is running in [facility] so very positional, RN planning to change IV site.   Goal of Therapy:  Heparin  level 0.3-0.5 units/ml Monitor platelets by anticoagulation protocol: Yes   Plan:  Increase heparin  infusion to 800 units/hr F/u 8 hour heparin  level Monitor daily HL, CBC, and for s/sx of bleeding   Thank you for allowing pharmacy to participate in this patient's care,  Suzen Sour, PharmD, BCCCP Clinical Pharmacist  Phone: [phone] 11/29/2024 8:02 PM  Please check AMION for all [facility] Pharmacy phone numbers After 10:00 PM, call Main Pharmacy [phone]     [1] No Known Allergies

## 2024-11-29 NOTE — Evaluation (Signed)
 Physical Therapy Evaluation Patient Details Name: [name] MRN: [id number] DOB: 1946/06/07 Today's Date: 11/29/2024  History of Present Illness  The pt is an 78 yo male presenting 12/18 from SNF with decreased responsiveness and L-sided weakness and drooling. Work up revealed acute PE and heparin  initiated 12/18 at 1300. MRI brain without acute findings. PMH includes: ICH, afib, CAD, DM II, HTN, and HLD.  Clinical Impression  Pt in bed upon arrival of PT, agreeable to evaluation at this time. Prior to admission the pt was a long-term resident at [facility], using lift with staff to transfer from bed to recliner. Per wife, pt with poor sitting balance and has fallen out of WC in the past. The pt tolerated chair position in bed this session, but was dependent on total support at his trunk, and demos inconsistent command following for LE strength testing and ROM. The pt was limited in assisting for repositioning due to pain in his back and L arm with effort. Will continue to follow acutely to progress activity and strength for bed mobility and balance, but anticipate pt will be best served by returning to [facility] once medically stable for d/c.     If plan is discharge home, recommend the following: Two people to help with walking and/or transfers;Two people to help with bathing/dressing/bathroom;Assistance with cooking/housework;Assistance with feeding;Direct supervision/assist for medications management;Direct supervision/assist for financial management;Assist for transportation;Help with stairs or ramp for entrance;Supervision due to cognitive status   Can travel by private vehicle   No    Equipment Recommendations None recommended by PT  Recommendations for Other Services       Functional Status Assessment Patient has had a recent decline in their functional status and demonstrates the ability to make significant improvements in function in a reasonable and predictable amount of time.      Precautions / Restrictions Precautions Precautions: Fall Recall of Precautions/Restrictions: Impaired Restrictions Weight Bearing Restrictions Per Provider Order: No      Mobility  Bed Mobility Overal bed mobility: Needs Assistance             General bed mobility comments: dependent on trunk support from bed, deferred for safety due to trunk sitffness, pain, and positioning on ED stretcher       Balance Overall balance assessment: Needs assistance (hx of falling out of a chair)                                           Pertinent Vitals/Pain Pain Assessment Faces Pain Scale: Hurts a little bit Pain Location: arm Pain Descriptors / Indicators: Discomfort Pain Intervention(s): Limited activity within patient's tolerance, Monitored during session, Repositioned    Home Living Family/patient expects to be discharged to:: Skilled nursing facility                   Additional Comments: Has been a resident of [facility] for last 6 years    Prior Function Prior Level of Function : Needs assist             Mobility Comments: At baseline hoyer lift to recliner. Per wife no sitting balance for w/c, not active with therapy ADLs Comments: Dependent for all ADLs except self-feeding and grooming     Extremity/Trunk Assessment   Upper Extremity Assessment Upper Extremity Assessment: Defer to OT evaluation RUE Deficits / Details: Baseline deficts from CVA. No  AROM at shoulder and elbow. Trace of muscle activation at wrist and hand RUE Coordination: decreased fine motor;decreased gross motor    Lower Extremity Assessment Lower Extremity Assessment: Generalized weakness;RLE deficits/detail;LLE deficits/detail RLE Deficits / Details: limited by impaired command following, no active ROM to command, but tolerates full ROM passively LLE Deficits / Details: pt maintaining ankle DF and toe extension, able to flex at knee and hip to command, limited  tolerance of resistance    Cervical / Trunk Assessment Cervical / Trunk Assessment: Kyphotic  Communication   Communication Communication: No apparent difficulties Factors Affecting Communication: Hearing impaired    Cognition Arousal: Lethargic Behavior During Therapy: Flat affect   PT - Cognitive impairments: History of cognitive impairments, Difficult to assess Difficult to assess due to: Impaired communication                     PT - Cognition Comments: pt with limited verbal contributions, followed commands intermittently with max cues Following commands: Impaired Following commands impaired: Follows one step commands inconsistently, Follows one step commands with increased time     Cueing Cueing Techniques: Verbal cues     General Comments General comments (skin integrity, edema, etc.): VSS on RA    Exercises     Assessment/Plan    PT Assessment Patient needs continued PT services  PT Problem List Decreased strength;Decreased activity tolerance;Decreased balance;Decreased mobility;Decreased safety awareness       PT Treatment Interventions DME instruction;Stair training;Gait training;Functional mobility training;Therapeutic activities;Therapeutic exercise;Balance training    PT Goals (Current goals can be found in the Care Plan section)  Acute Rehab PT Goals Patient Stated Goal: none stated PT Goal Formulation: With patient/family Time For Goal Achievement: 12/13/24 Potential to Achieve Goals: Good    Frequency Min 1X/week     Co-evaluation   Reason for Co-Treatment: To address functional/ADL transfers;Necessary to address cognition/behavior during functional activity PT goals addressed during session: Mobility/safety with mobility;Strengthening/ROM OT goals addressed during session: ADL's and self-care       AM-PAC PT 6 Clicks Mobility  Outcome Measure Help needed turning from your back to your side while in a flat bed without using  bedrails?: Total Help needed moving from lying on your back to sitting on the side of a flat bed without using bedrails?: Total Help needed moving to and from a bed to a chair (including a wheelchair)?: Total Help needed standing up from a chair using your arms (e.g., wheelchair or bedside chair)?: Total Help needed to walk in hospital room?: Total Help needed climbing 3-5 steps with a railing? : Total 6 Click Score: 6    End of Session   Activity Tolerance: Patient limited by fatigue;Other (comment) (arrival of echo) Patient left: in bed;with call bell/phone within reach;with nursing/sitter in room;with family/visitor present Nurse Communication: Mobility status;Need for lift equipment PT Visit Diagnosis: Unsteadiness on feet (R26.81);Muscle weakness (generalized) (M62.81);Pain    Time: 8598-8581 PT Time Calculation (min) (ACUTE ONLY): 17 min   Charges:   PT Evaluation $PT Eval Low Complexity: 1 Low   PT General Charges $$ ACUTE PT VISIT: 1 Visit         Izetta Call, PT, DPT   Acute Rehabilitation Department Office [phone] Secure Chat Communication Preferred  Izetta JULIANNA Call 11/29/2024, 4:27 PM

## 2024-11-29 NOTE — ED Notes (Signed)
 Pt cleaned of large amount incontinent urine.  External suction catheter applied, all linens and gown changed.  Wife at bedside.  Pt alert and responsive.  Follows commands if repeated to him by wife.

## 2024-11-29 NOTE — ED Notes (Signed)
 Pt bed linen changed. Peri care provided with warm soap and water . Brief changed.

## 2024-11-29 NOTE — Progress Notes (Signed)
 EEG complete - results pending

## 2024-11-29 NOTE — ED Notes (Signed)
 Patient cleaned, bed linens changed and patient provided with new, warm blankets. Bed locked and in lowest position, call bell within reach.

## 2024-11-29 NOTE — Procedures (Signed)
 Patient Name: [name]  MRN: [id number]  Epilepsy Attending: Arlin MALVA Krebs  Referring Physician/Provider: Michaela Aisha SQUIBB, MD  Date: 11/29/2024 Duration: 25.52 mins  Patient history: 78yo M with decreased left-sided function and was less interactive. EEG to evaluate for seizure  Level of alertness: Awake  AEDs during EEG study: None  Technical aspects: This EEG study was done with scalp electrodes positioned according to the 10-20 International system of electrode placement. Electrical activity was reviewed with band pass filter of 1-70Hz , sensitivity of 7 uV/mm, display speed of 49mm/sec with a 60Hz  notched filter applied as appropriate. EEG data were recorded continuously and digitally stored.  Video monitoring was available and reviewed as appropriate.  Description: The posterior dominant rhythm consists of 7 Hz activity of moderate voltage (25-35 uV) seen predominantly in posterior head regions, symmetric and reactive to eye opening and eye closing. EEG showed continuous generalized and lateralized left hemisphere 5 to 7 Hz theta slowing admixed with intermittent 2-3hz  delta slowing. Hyperventilation and photic stimulation were not performed.     ABNORMALITY - Continuous slow, generalized and lateralized left hemisphere  IMPRESSION: This study is suggestive of cortical dysfunction arising from left hemisphere likely secondary to underlying structural abnormality. Additionally there is generalized cerebral dysfunction (encephalopathy). No seizures or epileptiform discharges were seen throughout the recording.  Avila Albritton O Llewellyn Choplin

## 2024-11-29 NOTE — Progress Notes (Signed)
 PHARMACY - ANTICOAGULATION CONSULT NOTE  Pharmacy Consult for Heparin  Indication: pulmonary embolus  Allergies[1]  Patient Measurements: Height: 5' 8.5 (174 cm) Weight: 74.4 kg (164 lb 0.4 oz) IBW/kg (Calculated) : 69.55 HEPARIN  DW (KG): 74.4  Vital Signs: Temp: 98.1 F (36.7 C) (12/19 0954) Temp Source: Axillary (12/19 0954) BP: 147/84 (12/19 0900) Pulse Rate: 72 (12/19 0900)  Labs: Recent Labs    11/28/24 1106 11/28/24 1111 11/28/24 1150 11/28/24 2129 11/29/24 0936  HGB 16.7 16.4  --   --   --   HCT 49.0 50.7  --   --   --   PLT  --  182  --   --   --   APTT  --  30  --   --   --   LABPROT  --  14.8  --   --   --   INR  --  1.1  --   --   --   HEPARINUNFRC  --   --   --  1.01* 0.25*  CREATININE 2.10*  --  1.90*  --   --     Estimated Creatinine Clearance: 30.5 mL/min (A) (by C-G formula based on SCr of 1.9 mg/dL (H)).   Medical History: Past Medical History:  Diagnosis Date   A-fib [facility])    a. Post-op afib after CABG 08/2012.   Acute gastric ulcer with hemorrhage    Acute respiratory failure with hypoxia (HCC)    Aphasia following cerebral infarction    CAD (coronary artery disease)    a. NSTEMI s/p stent to distal RCA 03/2000. b. Inf MI s/p emergent thrombectomy/stenting mid RCA 10/2000. c. NSTEMI s/p CABGx4 (LIMA-LAD, SVG-OM2, seq SVG-acute marginal and PD) 08/16/12 - course complicated by confusion, post-op AF, L pleural effusion with thoracentesis. d. Normal LV function by echo 08/2012.   Diverticulosis    DM with CKD    Dysphagia following cerebral infarction    Encephalopathy    Extended spectrum beta lactamase (ESBL) resistance    Generalized anxiety disorder    GERD (gastroesophageal reflux disease)    Hemiplegia and hemiparesis following cerebral infarction affecting right dominant side (HCC)    Hiatal hernia    HLD (hyperlipidemia)    HTN (hypertension)    Non-ST elevation (NSTEMI) myocardial infarction [facility])    Nontraumatic intracerebral  hemorrhage in hemisphere, subcortical (HCC)    Peripheral vascular disease    a. Carotid dopplers neg 08/13/12. b. Pre-cabg ABIs - R=0.87 suggesting mild dz, L=1.29 possibly falsely elevated due to calcified vessels.   Pleural effusion    a. L pleural eff after CABG s/p thoracentesis 08/22/12.   Prostate cancer [facility])    Status post radiation treatment.   Prostatic hypertrophy    a. Hx of urinary retention, awaiting TURP.   Severe protein-calorie malnutrition    Stroke 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 Mild  MR by TEE 08/2012.    Medications:  See home med rec  Assessment: 78 y.o. M presents with stroke. Noted pt with h/o ICH. CTA shows nonobstructive thromboembolic disease along the posterior wall of the left.pulmonary artery. Neurology okay with starting heparin  - low goal, no bolus. No AC PTA. CBC ok on admission.  Heparin  level back slightly subtherapeutic (0.25) on infusion at 700 units/hr. No issues with line or bleeding reported per RN.  Goal of Therapy:  Heparin  level 0.3-0.5 units/ml  Monitor platelets by anticoagulation protocol: Yes   Plan:  Increase heparin  slightly to 750 units/hr F/u 8 hour heparin  level  Vito Ralph, PharmD, BCPS Please see amion for complete clinical pharmacist phone list 11/29/2024 10:18 AM         [1] No Known Allergies

## 2024-11-29 NOTE — Progress Notes (Addendum)
 STROKE TEAM PROGRESS NOTE   INTERIM HISTORY/SUBJECTIVE Patient wife is at the bedside.  He states that the patient had decreased awareness and responsiveness and denies focal weakness or hemiparesis Patient was admitted from his facility with left-sided weakness and drooling.  MRI was negative for acute infarct, and there is some concern for seizure activity.  However, routine EEG is negative for seizures or epileptiform discharges.  He has remained hemodynamically stable and afebrile and will intermittently follow commands.  OBJECTIVE  CBC    Component Value Date/Time   WBC 9.9 11/28/2024 1111   RBC 5.33 11/28/2024 1111   HGB 16.4 11/28/2024 1111   HCT 50.7 11/28/2024 1111   PLT 182 11/28/2024 1111   MCV 95.1 11/28/2024 1111   MCH 30.8 11/28/2024 1111   MCHC 32.3 11/28/2024 1111   RDW 12.4 11/28/2024 1111   LYMPHSABS 3.1 11/28/2024 1111   MONOABS 0.7 11/28/2024 1111   EOSABS 0.6 (H) 11/28/2024 1111   BASOSABS 0.1 11/28/2024 1111    BMET    Component Value Date/Time   NA 140 11/28/2024 1150   K 4.4 11/28/2024 1150   CL 106 11/28/2024 1150   CO2 28 11/28/2024 1150   GLUCOSE 135 (H) 11/28/2024 1150   BUN 41 (H) 11/28/2024 1150   CREATININE 1.90 (H) 11/28/2024 1150   CALCIUM  9.4 11/28/2024 1150   GFRNONAA 35 (L) 11/28/2024 1150    IMAGING past 24 hours EEG adult Result Date: 11/29/2024 Shelton Arlin KIDD, MD     11/29/2024 10:03 AM Patient Name: [name] MRN: [id number] Epilepsy Attending: Arlin KIDD Shelton Referring Physician/Provider: Michaela Aisha SQUIBB, MD Date: 11/29/2024 Duration: 25.52 mins Patient history: 77yo M with decreased left-sided function and was less interactive. EEG to evaluate for seizure Level of alertness: Awake AEDs during EEG study: None Technical aspects: This EEG study was done with scalp electrodes positioned according to the 10-20 International system of electrode placement. Electrical activity was reviewed with band pass filter of 1-70Hz ,  sensitivity of 7 uV/mm, display speed of 53mm/sec with a 60Hz  notched filter applied as appropriate. EEG data were recorded continuously and digitally stored.  Video monitoring was available and reviewed as appropriate. Description: The posterior dominant rhythm consists of 7 Hz activity of moderate voltage (25-35 uV) seen predominantly in posterior head regions, symmetric and reactive to eye opening and eye closing. EEG showed continuous generalized and lateralized left hemisphere 5 to 7 Hz theta slowing admixed with intermittent 2-3hz  delta slowing. Hyperventilation and photic stimulation were not performed.   ABNORMALITY - Continuous slow, generalized and lateralized left hemisphere IMPRESSION: This study is suggestive of cortical dysfunction arising from left hemisphere likely secondary to underlying structural abnormality. Additionally there is generalized cerebral dysfunction (encephalopathy). No seizures or epileptiform discharges were seen throughout the recording. Arlin KIDD Shelton   MR BRAIN WO CONTRAST Result Date: 11/28/2024 EXAM: MRI BRAIN WITHOUT CONTRAST 11/28/2024 04:10:00 PM TECHNIQUE: Multiplanar multisequence MRI of the head/brain was performed without the administration of intravenous contrast. COMPARISON: CT of the head dated 11/28/2024. CLINICAL HISTORY: Neuro deficit, acute, stroke suspected. FINDINGS: BRAIN AND VENTRICLES: No acute infarct. There is no restricted diffusion present to indicate acute or recent infarction. No intracranial hemorrhage. No mass. No midline shift. No hydrocephalus. Extensive encephalomalacia changes are again demonstrated within the left cerebral hemisphere with hemosiderin staining in the periventricular white matter of the left temporal and parietal lobes. There is extensive left cerebral gliosis. There is mild-to-moderate periventricular white matter disease. There is Wallerian degeneration of the  left cerebral peduncle. The sella is unremarkable. Normal flow  voids. ORBITS: The patient is status post left lens replacement. No acute abnormality. SINUSES AND MASTOIDS: Circumferential mucosal disease within the floors of the maxillary sinuses. There is mild right frontal sinus disease. BONES AND SOFT TISSUES: Normal marrow signal. No acute soft tissue abnormality. IMPRESSION: 1. No acute intracranial abnormality. No evidence of acute or recent infarct. 2. Extensive encephalomalacia within the left cerebral hemisphere with hemosiderin staining in the periventricular white matter of the left temporal and parietal lobes, extensive left cerebral gliosis, and Wallerian degeneration of the left cerebral peduncle. 3. Mild-to-moderate periventricular white matter disease. Electronically signed by: Evalene Coho MD 11/28/2024 06:09 PM EST RP Workstation: [id number]   CT ANGIO HEAD NECK W WO CM (CODE STROKE) Result Date: 11/28/2024 EXAM: CTA HEAD AND NECK WITH AND WITHOUT 11/28/2024 11:20:00 AM TECHNIQUE: CTA of the head and neck was performed with and without the administration of intravenous contrast. Multiplanar 2D and/or 3D reformatted images are provided for review. Automated exposure control, iterative reconstruction, and/or weight based adjustment of the mA/kV was utilized to reduce the radiation dose to as low as reasonably achievable. Stenosis of the internal carotid arteries measured using NASCET criteria. COMPARISON: CT of the head dated 11/28/2024. CLINICAL HISTORY: Neuro deficit, acute, stroke suspected. FINDINGS: CTA NECK: AORTIC ARCH AND ARCH VESSELS: Mild calcific plaque within the thoracic aorta. No dissection or arterial injury. No significant stenosis of the brachiocephalic or subclavian arteries. CERVICAL CAROTID ARTERIES: Minimal calcific plaque within the right carotid bulb. No dissection, arterial injury, or hemodynamically significant stenosis by NASCET criteria. CERVICAL VERTEBRAL ARTERIES: No dissection, arterial injury, or significant stenosis. LUNGS  AND MEDIASTINUM: Significant incidental finding of nonobstructive thromboembolic disease along the posterior wall of the left pulmonary artery. A follow up CT pulmonary arteriogram of the chest is recommended. SOFT TISSUES: No acute abnormality. BONES: No acute abnormality. CTA HEAD: ANTERIOR CIRCULATION: Calcific plaque within the carotid siphons, with less than 10% luminal stenosis present bilaterally. No significant stenosis of the anterior cerebral arteries. No significant stenosis of the middle cerebral arteries. No aneurysm. POSTERIOR CIRCULATION: No significant stenosis of the posterior cerebral arteries. No significant stenosis of the basilar artery. No significant stenosis of the vertebral arteries. No aneurysm. OTHER: No dural venous sinus thrombosis on this non-dedicated study. COMMUNICATION: The above findings and recommendations were communicated to Doctor Michaela at 11:37 AM 11/28/2024. IMPRESSION: 1. No large vessel occlusion, hemodynamically significant stenosis, or aneurysm in the head or neck. 2. Nonobstructive thromboembolic disease along the posterior wall of the left pulmonary artery. Follow-up CT pulmonary arteriogram of the chest is recommended. Findings and recommendations were communicated to Dr. Michaela at 11:37 AM on 11/28/24. Electronically signed by: Evalene Coho MD 11/28/2024 11:41 AM EST RP Workstation: [id number]   CT HEAD CODE STROKE WO CONTRAST Result Date: 11/28/2024 EXAM: CT HEAD WITHOUT CONTRAST 11/28/2024 11:09:00 AM TECHNIQUE: CT of the head was performed without the administration of intravenous contrast. Automated exposure control, iterative reconstruction, and/or weight based adjustment of the mA/kV was utilized to reduce the radiation dose to as low as reasonably achievable. COMPARISON: CT of the head dated 12/08/2022. CLINICAL HISTORY: Neuro deficit, acute, stroke suspected. FINDINGS: BRAIN AND VENTRICLES: No acute hemorrhage. No hydrocephalus. No  extra-axial collection. No mass effect or midline shift. There is age-related volume loss. Extensive encephalomalacia changes are present within the left cerebral hemisphere involving the left posterior frontal, left parietal, occipital, and medial temporal lobes. There is Wallerian degeneration of the left cerebral  peduncle. There is a chronic lacunar infarct within the right thalamus. Moderate atheromatous calcification is noted within the carotid siphons. The above findings were communicated to Doctor Michaela at 11:18 AM 11/28/2024. [place] Stroke Program Early CT Score (ASPECTS) Ganglionic (caudate, IC, lentiform nucleus, insula, M1-M3): 7 Supraganglionic (M4-M6): 3 Total: 10 ORBITS: No acute abnormality. SINUSES: No acute abnormality. SOFT TISSUES AND SKULL: No acute soft tissue abnormality. No skull fracture. IMPRESSION: 1. No acute intracranial abnormality in the setting of suspected acute stroke. 2. Extensive chronic encephalomalacia throughout the left cerebral hemisphere (posterior frontal, parietal, occipital, and medial temporal lobes) with Wallerian degeneration of the left cerebral peduncle. 3. Chronic right thalamic lacunar infarct. 4. Moderate atheromatous calcification of the carotid siphons. 5. aspects: 10 Electronically signed by: Evalene Coho MD 11/28/2024 11:21 AM EST RP Workstation: [id number]    Vitals:   11/29/24 0100 11/29/24 0400 11/29/24 0900 11/29/24 0954  BP: 126/78 130/79 (!) 147/84   Pulse: 68 65 72   Resp: 17 16 (!) 24   Temp:  97.6 F (36.4 C)  98.1 F (36.7 C)  TempSrc:  Oral  Axillary  SpO2: 93% 93% 94%   Weight:      Height:         PHYSICAL EXAM General: Chronically ill-appearing elderly patient in no acute distress Psych: Affect blunted CV: Regular rate and rhythm on monitor Respiratory:  Regular, unlabored respirations on room air   NEURO:  Mental Status: Patient is nonverbal, will intermittently respond to name and intermittently follows  commands Speech/Language: No verbal output  Cranial Nerves:  II: PERRL.  Blinks to threat bilaterally III, IV, VI: Tracks examiner around the room VII: Face is symmetrical resting  VIII: hearing intact to voice. XII: tongue is midline without fasciculations. Motor: Able to move left upper and lower extremities with antigravity strength, no movement of right upper and lower extremities Tone: is normal and bulk is normal Sensation- Intact to light touch bilaterally.  Coordination: Unable to perform Gait- deferred  Most Recent NIH  1a Level of Conscious.: 1 1b LOC Questions: 2 1c LOC Commands: 2 2 Best Gaze: 0 3 Visual: 0 4 Facial Palsy: 0 5a Motor Arm - left: 2 5b Motor Arm - Right: 4 6a Motor Leg - Left: 3 6b Motor Leg - Right: 4 7 Limb Ataxia: 0 8 Sensory: 0 9 Best Language: 3 10 Dysarthria: 2 11 Extinct. and Inatten.: 0 TOTAL: 23   ASSESSMENT/PLAN  [name] is a 78 y.o. male with history of hypertension, hyperlipidemia, diabetes, chronic kidney disease, peripheral arterial disease, GERD, stroke with residual right-sided weakness, CAD, A-fib not on anticoagulation, DVT, dementia and depression admitted for decreased responsiveness and left-sided weakness.  Patient's wife reports that at baseline, he will occasionally communicate verbally but it is difficult to understand what he is saying.  He has had decreased p.o. intake for the last few months.  MRI was negative for acute infarct, and routine EEG demonstrated no seizures or epileptiform discharges.  He was found to have a pulmonary embolus and has been placed on IV heparin .  NIH on Admission 19  Likely toxic metabolic encephalopathy versus intermittent subclinical seizure activity versus behavioral fluctuation in the setting of dementia in patient with previous stroke and pulmonary embolus Code Stroke CT head No acute abnormality.  Chronic encephalomalacia in left hemisphere, chronic right thalamic infarct ASPECTS  10.    CTA head & neck no LVO, hemodynamically significant stenosis or aneurysm MRI no acute abnormality, encephalomalacia within  the left cerebral hemisphere with left cerebral gliosis and wallerian degeneration of left cerebral peduncle, mild to moderate periventricular white matter disease 2D Echo  EF 55-60% LA size normal EEG negative for seizures and epileptiform discharges No fever or leukocytosis noted, low suspicion for infection causing toxic metabolic encephalopathy, suspect behavioral fluctuation in the setting of dementia LDL 114 HgbA1c 7.5 VTE prophylaxis -fully anticoagulated with heparin  No antithrombotic prior to admission, now on heparin  IV for pulmonary embolus Therapy recommendations:  Pending Disposition: Pending, likely return to SNF  Pulmonary embolus Incidental PE found during CTA head and neck CTA chest delayed due to chronic kidney disease and patient having received a contrast load already IV heparin  per primary team  Atrial fibrillation Home Meds: None Continue telemetry monitoring On anticoagulation with IV heparin  for pulmonary embolus, will discuss anticoagulation for A-fib with family  Hypertension Home meds: Losartan 25 mg daily, amlodipine  2.5 mg daily Stable Maintain normotension  Hyperlipidemia Home meds: Rosuvastatin  10 mg daily, resumed in hospital LDL pending, goal < 70 Continue statin at discharge  Diabetes type II poorly controlled Home meds: Insulin  glargine 24 units daily, insulin  lispro 5 units 3 times daily with meals HgbA1c 7.5, goal < 7.0 CBGs SSI Recommend close follow-up with PCP for better DM control  Dysphagia Patient has post-stroke dysphagia, SLP consulted    Diet   Diet NPO time specified   Advance diet as tolerated  Other Stroke Risk Factors Advanced age Coronary artery disease   Other Active Problems Dementia-delirium precautions  Hospital day # 1  Patient seen by NP with MD, MD to edit note as  needed. Cortney E Everitt Clint Kill , MSN, AGACNP-BC [facility] See Amion for schedule and pager information 11/29/2024 10:34 AM  I have personally obtained history,examined this patient, reviewed notes, independently viewed imaging studies, participated in medical decision making and plan of care.ROS completed by me personally and pertinent positives fully documented  I have made any additions or clarifications directly to the above note. Agree with note above.  Patient presented from skilled nursing facility for decreased functional concern for stroke.  MRI was negative for stroke.  EEG shows no seizure activity.  Suspect worsening of old deficits in the setting of possible metabolic encephalopathy.  Check UA,.  Chest x-ray and labs.  Long discussion with patient and wife at the bedside and answered questions.  Discussed with Dr. Rosario   I personally spent a total of 50 minutes in the care of the patient today including getting/reviewing separately obtained history, performing a medically appropriate exam/evaluation, counseling and educating, placing orders, referring and communicating with other health care professionals, documenting clinical information in the EHR, independently interpreting results, and coordinating care.      Stroke Team will sign off. Call for questions  Eather Popp, MD Medical Director [facility] Pager: [phone] 11/29/2024 1:13 PM    To contact Stroke Continuity provider, please refer to [email or web address]. After hours, contact General Neurology

## 2024-11-29 NOTE — Progress Notes (Signed)
 " PROGRESS NOTE    [name]  FMW:[id number] DOB: 11-05-1946 DOA: 11/28/2024 PCP: Landy Barnie RAMAN, NP  Outpatient Specialists:     Brief Narrative:  Patient is an 78 year old male past medical history significant for hypertension, hyperlipidemia, diabetes, CKD 3B, PAD, GERD, CVA, CAD, A-fib, DVT, dementia, depression and RLS.  Patient was admitted with concerns for repeat CVA with left-sided weakness and decreased verbal responsiveness, however, workup has been negative for stroke.  Patient is awake and alert, and moves left upper and lower extremity.  Presentation is concerning for likely acute toxic metabolic encephalopathy that is resolving.  Pulmonary embolism was found incidentally during the workup.  Patient is currently on heparin  drip.  11/29/2024: Patient seen alongside patient's wife.  Patient is awake and alert.  Patient was left upper and lower extremities.  Patient hits out from time to time, likely due to behavioral problems associated with dementing illness.  Likely discharge in the next 24 hours.  EEG is negative.  Acute kidney injury on chronic kidney disease stage 3a is noted (baseline serum creatinine of 1.48.  On presentation, serum creatinine was 2.1).  AKI may be secondary to volume depletion.   Assessment & Plan:   Principal Problem:   Pulmonary embolism (HCC) Active Problems:   CAD (coronary artery disease)   Peripheral vascular disease (HCC)   Atrial fibrillation with normal ventricular rate (HCC)   History of hemorrhagic cerebrovascular accident (CVA) with residual deficit   Hypertension associated with diabetes (HCC)   Dyslipidemia associated with type 2 diabetes mellitus (HCC)   Type 2 diabetes mellitus with peripheral vascular disease (HCC)   Major depression with psychotic features (HCC)   RLS (restless legs syndrome)   Vascular dementia without behavioral disturbance (HCC)   GERD without esophagitis   Stage 3b chronic kidney disease (HCC)   Focal  neurological deficit   Focal neurologic deficits History of CVA > History of prior hemorrhagic CVA and prior infarct. > Baseline right-sided deficits. > At the facility, patient was found to have decreased left-sided function and was less interactive > Brought as a code stroke, seen by neurology.  CT head showed old infarct and chronic encephalomalacia from prior stroke. > CTA head neck did not show LVO, significant stenosis, aneurysm but did show evidence of PE; addressed below. - Neurology consulted -Workup for stroke so far is negative.  MRI brain is negative for acute stroke. - Continue home statin   - Follow echocardiogram  - Tele monitoring  - SLP eval (has baseline dysphagia issues prior to this event) - PT/OT -EEG was negative.   Acute PE History of DVT > History of DVT, previously on Eliquis .  Completed course has been off Eliquis  since 2023. > Incidental PE noted on CTA head and neck performed for workup of possible stroke as above. - Continue with heparin  - Echocardiogram - Will benefit from CTA PE study, however this has been delayed due to CKD and patient was exposed to to contrast dye earlier.  Hypertension - Continue to optimize. - Keep blood pressure less than 130/80 mmHg.   -Last blood pressure 147/84 mmHg.   Hyperlipidemia - Continue rosuvastatin    Diabetes > Continue subcutaneous Lantus  10 units daily, and sliding scale insulin  coverage (0 to 15 units).     Acute kidney injury on chronic kidney disease 3A versus progression to CKD 3B: > Baseline serum creatinine of 1.48. - Admitted with serum creatinine of 2.10. - Last serum creatinine was 1.90. - Avoid nephrotoxins. -  Adequately hydrate patient. - Keep MAP greater than 65 mmHg. - Avoid nephrotoxins.   -Suspect secondary to volume depletion.   PAD - On heparin  for acute PE.  -Stable   CAD - On heparin  for acute PE. - Stable.  - Continue home rosuvastatin    A-fib - On heparin  for now    Depression Dementia RLS - Noted   DVT prophylaxis: Subcutaneous heparin  Code Status: DO NOT RESUSCITATE Family Communication: Wife by bedside Disposition Plan: Likely discharge back to SNF   Consultants:  Neurology  Procedures:  EEG  Antimicrobials:  None   Subjective: Patient is unable to give any history. Patient moves left upper and lower extremity. Patient is awake and alert  Objective: Vitals:   11/29/24 0100 11/29/24 0400 11/29/24 0900 11/29/24 0954  BP: 126/78 130/79 (!) 147/84   Pulse: 68 65 72   Resp: 17 16 (!) 24   Temp:  97.6 F (36.4 C)  98.1 F (36.7 C)  TempSrc:  Oral  Axillary  SpO2: 93% 93% 94%   Weight:      Height:        Intake/Output Summary (Last 24 hours) at 11/29/2024 1225 Last data filed at 11/29/2024 0310 Gross per 24 hour  Intake 326.64 ml  Output --  Net 326.64 ml   Filed Weights   11/28/24 1100 11/28/24 1121  Weight: 74.4 kg 74.4 kg    Examination:  General exam: Appears calm and comfortable.  Mild discharge from left eye. Respiratory system: Clear to auscultation. Respiratory effort normal. Cardiovascular system: S1 & S2 heard Gastrointestinal system: Abdomen is soft and nontender. No organomegaly or masses felt. Normal bowel sounds heard. Central nervous system: Awake and alert.  Patient is demented.  Right-sided hemiplegia (old).  Patient moves left upper and lower extremity.    Data Reviewed: I have personally reviewed following labs and imaging studies  CBC: Recent Labs  Lab 11/28/24 1106 11/28/24 1111  WBC  --  9.9  NEUTROABS  --  5.4  HGB 16.7 16.4  HCT 49.0 50.7  MCV  --  95.1  PLT  --  182   Basic Metabolic Panel: Recent Labs  Lab 11/28/24 1106 11/28/24 1150  NA 143 140  K 5.3* 4.4  CL 106 106  CO2  --  28  GLUCOSE 153* 135*  BUN 54* 41*  CREATININE 2.10* 1.90*  CALCIUM   --  9.4   GFR: Estimated Creatinine Clearance: 30.5 mL/min (A) (by C-G formula based on SCr of 1.9 mg/dL (H)). Liver  Function Tests: Recent Labs  Lab 11/28/24 1150  AST 23  ALT 15  ALKPHOS 96  BILITOT 0.6  PROT 5.9*  ALBUMIN  3.1*   No results for input(s): LIPASE, AMYLASE in the last 168 hours. No results for input(s): AMMONIA in the last 168 hours. Coagulation Profile: Recent Labs  Lab 11/28/24 1111  INR 1.1   Cardiac Enzymes: No results for input(s): CKTOTAL, CKMB, CKMBINDEX, TROPONINI in the last 168 hours. BNP (last 3 results) No results for input(s): PROBNP in the last 8760 hours. HbA1C: No results for input(s): HGBA1C in the last 72 hours. CBG: Recent Labs  Lab 11/28/24 1652 11/28/24 1940 11/28/24 2325 11/29/24 0354 11/29/24 0754  GLUCAP 75 72 98 83 71   Lipid Profile: No results for input(s): CHOL, HDL, LDLCALC, TRIG, CHOLHDL, LDLDIRECT in the last 72 hours. Thyroid  Function Tests: No results for input(s): TSH, T4TOTAL, FREET4, T3FREE, THYROIDAB in the last 72 hours. Anemia Panel: No results for input(s): VITAMINB12,  FOLATE, FERRITIN, TIBC, IRON, RETICCTPCT in the last 72 hours. Urine analysis:    Component Value Date/Time   COLORURINE YELLOW 05/04/2023 0310   APPEARANCEUR CLOUDY (A) 05/04/2023 0310   LABSPEC 1.016 05/04/2023 0310   PHURINE 5.0 05/04/2023 0310   GLUCOSEU NEGATIVE 05/04/2023 0310   HGBUR MODERATE (A) 05/04/2023 0310   BILIRUBINUR NEGATIVE 05/04/2023 0310   KETONESUR NEGATIVE 05/04/2023 0310   PROTEINUR 100 (A) 05/04/2023 0310   UROBILINOGEN 1.0 08/23/2012 1732   NITRITE NEGATIVE 05/04/2023 0310   LEUKOCYTESUR LARGE (A) 05/04/2023 0310   Sepsis Labs: @LABRCNTIP (procalcitonin:4,lacticidven:4)  )No results found for this or any previous visit (from the past 240 hours).       Radiology Studies: EEG adult Result Date: 11/29/2024 Shelton Arlin KIDD, MD     11/29/2024 10:03 AM Patient Name: KDEN WAGSTER MRN: [id number] Epilepsy Attending: Arlin KIDD Shelton Referring Physician/Provider:  Michaela Aisha SQUIBB, MD Date: 11/29/2024 Duration: 25.52 mins Patient history: 78yo M with decreased left-sided function and was less interactive. EEG to evaluate for seizure Level of alertness: Awake AEDs during EEG study: None Technical aspects: This EEG study was done with scalp electrodes positioned according to the 10-20 International system of electrode placement. Electrical activity was reviewed with band pass filter of 1-70Hz , sensitivity of 7 uV/mm, display speed of 42mm/sec with a 60Hz  notched filter applied as appropriate. EEG data were recorded continuously and digitally stored.  Video monitoring was available and reviewed as appropriate. Description: The posterior dominant rhythm consists of 7 Hz activity of moderate voltage (25-35 uV) seen predominantly in posterior head regions, symmetric and reactive to eye opening and eye closing. EEG showed continuous generalized and lateralized left hemisphere 5 to 7 Hz theta slowing admixed with intermittent 2-3hz  delta slowing. Hyperventilation and photic stimulation were not performed.   ABNORMALITY - Continuous slow, generalized and lateralized left hemisphere IMPRESSION: This study is suggestive of cortical dysfunction arising from left hemisphere likely secondary to underlying structural abnormality. Additionally there is generalized cerebral dysfunction (encephalopathy). No seizures or epileptiform discharges were seen throughout the recording. Arlin KIDD Shelton   MR BRAIN WO CONTRAST Result Date: 11/28/2024 EXAM: MRI BRAIN WITHOUT CONTRAST 11/28/2024 04:10:00 PM TECHNIQUE: Multiplanar multisequence MRI of the head/brain was performed without the administration of intravenous contrast. COMPARISON: CT of the head dated 11/28/2024. CLINICAL HISTORY: Neuro deficit, acute, stroke suspected. FINDINGS: BRAIN AND VENTRICLES: No acute infarct. There is no restricted diffusion present to indicate acute or recent infarction. No intracranial hemorrhage. No mass.  No midline shift. No hydrocephalus. Extensive encephalomalacia changes are again demonstrated within the left cerebral hemisphere with hemosiderin staining in the periventricular white matter of the left temporal and parietal lobes. There is extensive left cerebral gliosis. There is mild-to-moderate periventricular white matter disease. There is Wallerian degeneration of the left cerebral peduncle. The sella is unremarkable. Normal flow voids. ORBITS: The patient is status post left lens replacement. No acute abnormality. SINUSES AND MASTOIDS: Circumferential mucosal disease within the floors of the maxillary sinuses. There is mild right frontal sinus disease. BONES AND SOFT TISSUES: Normal marrow signal. No acute soft tissue abnormality. IMPRESSION: 1. No acute intracranial abnormality. No evidence of acute or recent infarct. 2. Extensive encephalomalacia within the left cerebral hemisphere with hemosiderin staining in the periventricular white matter of the left temporal and parietal lobes, extensive left cerebral gliosis, and Wallerian degeneration of the left cerebral peduncle. 3. Mild-to-moderate periventricular white matter disease. Electronically signed by: Evalene Coho MD 11/28/2024 06:09 PM EST RP Workstation: [id number]  CT ANGIO HEAD NECK W WO CM (CODE STROKE) Result Date: 11/28/2024 EXAM: CTA HEAD AND NECK WITH AND WITHOUT 11/28/2024 11:20:00 AM TECHNIQUE: CTA of the head and neck was performed with and without the administration of intravenous contrast. Multiplanar 2D and/or 3D reformatted images are provided for review. Automated exposure control, iterative reconstruction, and/or weight based adjustment of the mA/kV was utilized to reduce the radiation dose to as low as reasonably achievable. Stenosis of the internal carotid arteries measured using NASCET criteria. COMPARISON: CT of the head dated 11/28/2024. CLINICAL HISTORY: Neuro deficit, acute, stroke suspected. FINDINGS: CTA NECK: AORTIC  ARCH AND ARCH VESSELS: Mild calcific plaque within the thoracic aorta. No dissection or arterial injury. No significant stenosis of the brachiocephalic or subclavian arteries. CERVICAL CAROTID ARTERIES: Minimal calcific plaque within the right carotid bulb. No dissection, arterial injury, or hemodynamically significant stenosis by NASCET criteria. CERVICAL VERTEBRAL ARTERIES: No dissection, arterial injury, or significant stenosis. LUNGS AND MEDIASTINUM: Significant incidental finding of nonobstructive thromboembolic disease along the posterior wall of the left pulmonary artery. A follow up CT pulmonary arteriogram of the chest is recommended. SOFT TISSUES: No acute abnormality. BONES: No acute abnormality. CTA HEAD: ANTERIOR CIRCULATION: Calcific plaque within the carotid siphons, with less than 10% luminal stenosis present bilaterally. No significant stenosis of the anterior cerebral arteries. No significant stenosis of the middle cerebral arteries. No aneurysm. POSTERIOR CIRCULATION: No significant stenosis of the posterior cerebral arteries. No significant stenosis of the basilar artery. No significant stenosis of the vertebral arteries. No aneurysm. OTHER: No dural venous sinus thrombosis on this non-dedicated study. COMMUNICATION: The above findings and recommendations were communicated to Doctor Michaela at 11:37 AM 11/28/2024. IMPRESSION: 1. No large vessel occlusion, hemodynamically significant stenosis, or aneurysm in the head or neck. 2. Nonobstructive thromboembolic disease along the posterior wall of the left pulmonary artery. Follow-up CT pulmonary arteriogram of the chest is recommended. Findings and recommendations were communicated to Dr. Michaela at 11:37 AM on 11/28/24. Electronically signed by: Evalene Coho MD 11/28/2024 11:41 AM EST RP Workstation: [id number]   CT HEAD CODE STROKE WO CONTRAST Result Date: 11/28/2024 EXAM: CT HEAD WITHOUT CONTRAST 11/28/2024 11:09:00 AM TECHNIQUE:  CT of the head was performed without the administration of intravenous contrast. Automated exposure control, iterative reconstruction, and/or weight based adjustment of the mA/kV was utilized to reduce the radiation dose to as low as reasonably achievable. COMPARISON: CT of the head dated 12/08/2022. CLINICAL HISTORY: Neuro deficit, acute, stroke suspected. FINDINGS: BRAIN AND VENTRICLES: No acute hemorrhage. No hydrocephalus. No extra-axial collection. No mass effect or midline shift. There is age-related volume loss. Extensive encephalomalacia changes are present within the left cerebral hemisphere involving the left posterior frontal, left parietal, occipital, and medial temporal lobes. There is Wallerian degeneration of the left cerebral peduncle. There is a chronic lacunar infarct within the right thalamus. Moderate atheromatous calcification is noted within the carotid siphons. The above findings were communicated to Doctor Michaela at 11:18 AM 11/28/2024. [place] Stroke Program Early CT Score (ASPECTS) Ganglionic (caudate, IC, lentiform nucleus, insula, M1-M3): 7 Supraganglionic (M4-M6): 3 Total: 10 ORBITS: No acute abnormality. SINUSES: No acute abnormality. SOFT TISSUES AND SKULL: No acute soft tissue abnormality. No skull fracture. IMPRESSION: 1. No acute intracranial abnormality in the setting of suspected acute stroke. 2. Extensive chronic encephalomalacia throughout the left cerebral hemisphere (posterior frontal, parietal, occipital, and medial temporal lobes) with Wallerian degeneration of the left cerebral peduncle. 3. Chronic right thalamic lacunar infarct. 4. Moderate atheromatous calcification of the carotid siphons.  5. aspects: 10 Electronically signed by: Evalene Coho MD 11/28/2024 11:21 AM EST RP Workstation: [id number]        Scheduled Meds:   stroke: early stages of recovery book   Does not apply Once   FLUoxetine   10 mg Oral Daily   insulin  aspart  0-15 Units Subcutaneous  Q4H   insulin  glargine  10 Units Subcutaneous Daily   rosuvastatin   10 mg Oral QHS   trimethoprim -polymyxin b   1 drop Left Eye Q4H   Continuous Infusions:  sodium chloride  40 mL/hr at 11/29/24 0310   heparin  750 Units/hr (11/29/24 1042)     LOS: 1 day    Time spent: 55 minutes    Leatrice Chapel, MD  [facility] Pager #: [phone] 7PM-7AM contact night coverage as above    "

## 2024-11-29 NOTE — Evaluation (Signed)
 Occupational Therapy Evaluation Patient Details Name: [name] MRN: [id number] DOB: 01/30/1946 Today's Date: 11/29/2024   History of Present Illness   The pt is an 78 yo male presenting 12/18 from SNF with decreased responsiveness and L-sided weakness and drooling. Work up revealed acute PE and heparin  initiated 12/18 at 1300. MRI brain without acute findings. PMH includes: ICH, afib, CAD, DM II, HTN, and HLD.     Clinical Impressions Pt admitted based on above, and was seen based on problem list below. PTA pt was living at the [facility] and was dependent for transfers with use of hoyer lift. Pt's wife reports at baseline pt can self-feed and groom at bed level, but is dependent for all other ADLs. Today pt is at his baseline for ADLS, no further acute OT needs. Pt's spouse reporting a preference for pt to trial skilled rehab services at his facility after d/c. All education complete, no further acute OT needs, OT is signing off on this pt.      If plan is discharge home, recommend the following:   Two people to help with walking and/or transfers;Two people to help with bathing/dressing/bathroom;Assistance with cooking/housework     Functional Status Assessment   Patient has not had a recent decline in their functional status     Equipment Recommendations   Other (comment) (Defer to next venue)      Precautions/Restrictions   Precautions Precautions: Fall Recall of Precautions/Restrictions: Impaired Restrictions Weight Bearing Restrictions Per Provider Order: No     Mobility Bed Mobility     General bed mobility comments: Deferred for safety pt's body habituous on stretcher            ADL either performed or assessed with clinical judgement   ADL Overall ADL's : At baseline     General ADL Comments: Pt at baseline dependent for ADLs. Able to self-feed and groom from bed level per pt's wife     Vision   Vision Assessment?: No apparent visual  deficits Additional Comments: Pt tracking people in room with eyes, limited command following for formal testing            Pertinent Vitals/Pain Pain Assessment Pain Assessment: Faces Faces Pain Scale: Hurts a little bit Pain Location: arm Pain Descriptors / Indicators: Discomfort Pain Intervention(s): Limited activity within patient's tolerance     Extremity/Trunk Assessment Upper Extremity Assessment Upper Extremity Assessment: RUE deficits/detail RUE Deficits / Details: Baseline deficts from CVA. No AROM at shoulder and elbow. Trace of muscle activation at wrist and hand RUE Coordination: decreased fine motor;decreased gross motor   Lower Extremity Assessment Lower Extremity Assessment: Defer to PT evaluation       Communication Communication Communication: No apparent difficulties Factors Affecting Communication: Hearing impaired   Cognition Arousal: Lethargic Behavior During Therapy: Flat affect Cognition: History of cognitive impairments       OT - Cognition Comments: Hx of cog impaiments since CVA. Wife present reporting baseline mostly non-verbal     Following commands: Impaired Following commands impaired: Follows one step commands inconsistently     Cueing  General Comments   Cueing Techniques: Verbal cues  VSS on RA           Home Living Family/patient expects to be discharged to:: Skilled nursing facility         Additional Comments: Has been a resident of [facility] for last 6 years      Prior Functioning/Environment Prior Level of Function : Needs assist  Mobility Comments: At baseline hoyer lift to recliner. Per wife no sitting balance for w/c, not active with therapy ADLs Comments: Dependent for all ADLs except self-feeding and grooming    OT Problem List: Cardiopulmonary status limiting activity        OT Goals(Current goals can be found in the care plan section)   Acute Rehab OT Goals Patient Stated Goal:  None stated OT Goal Formulation: All assessment and education complete, DC therapy Time For Goal Achievement: 12/13/24 Potential to Achieve Goals: Good      Co-evaluation PT/OT/SLP Co-Evaluation/Treatment: Yes Reason for Co-Treatment: To address functional/ADL transfers;Necessary to address cognition/behavior during functional activity PT goals addressed during session: Mobility/safety with mobility;Strengthening/ROM OT goals addressed during session: ADL's and self-care      AM-PAC OT 6 Clicks Daily Activity     Outcome Measure Help from another person eating meals?: A Little Help from another person taking care of personal grooming?: A Little Help from another person toileting, which includes using toliet, bedpan, or urinal?: Total Help from another person bathing (including washing, rinsing, drying)?: Total Help from another person to put on and taking off regular upper body clothing?: Total Help from another person to put on and taking off regular lower body clothing?: Total 6 Click Score: 10   End of Session Nurse Communication: Patient requests pain meds  Activity Tolerance: Patient tolerated treatment well Patient left: in bed;with call bell/phone within reach;with nursing/sitter in room  OT Visit Diagnosis: Other (comment) (cardiopulmonary)                Time: 8598-8581 OT Time Calculation (min): 17 min Charges:  OT General Charges $OT Visit: 1 Visit OT Evaluation $OT Eval Low Complexity: 1 Low  Adrianne BROCKS, OT  Acute Rehabilitation Services Office [phone] Secure chat preferred   Adrianne GORMAN Savers 11/29/2024, 4:20 PM

## 2024-11-29 NOTE — Evaluation (Signed)
 Clinical/Bedside Swallow Evaluation Patient Details  Name: [name] MRN: [id number] Date of Birth: October 29, 1946  Today's Date: 11/29/2024 Time: SLP Start Time (ACUTE ONLY): 1100 SLP Stop Time (ACUTE ONLY): 1125 SLP Time Calculation (min) (ACUTE ONLY): 25 min  Past Medical History:  Past Medical History:  Diagnosis Date   A-fib (HCC)    a. Post-op afib after CABG 08/2012.   Acute gastric ulcer with hemorrhage    Acute respiratory failure with hypoxia (HCC)    Aphasia following cerebral infarction    CAD (coronary artery disease)    a. NSTEMI s/p stent to distal RCA 03/2000. b. Inf MI s/p emergent thrombectomy/stenting mid RCA 10/2000. c. NSTEMI s/p CABGx4 (LIMA-LAD, SVG-OM2, seq SVG-acute marginal and PD) 08/16/12 - course complicated by confusion, post-op AF, L pleural effusion with thoracentesis. d. Normal LV function by echo 08/2012.   Diverticulosis    DM with CKD    Dysphagia following cerebral infarction    Encephalopathy    Extended spectrum beta lactamase (ESBL) resistance    Generalized anxiety disorder    GERD (gastroesophageal reflux disease)    Hemiplegia and hemiparesis following cerebral infarction affecting right dominant side (HCC)    Hiatal hernia    HLD (hyperlipidemia)    HTN (hypertension)    Non-ST elevation (NSTEMI) myocardial infarction [facility])    Nontraumatic intracerebral hemorrhage in hemisphere, subcortical (HCC)    Peripheral vascular disease    a. Carotid dopplers neg 08/13/12. b. Pre-cabg ABIs - R=0.87 suggesting mild dz, L=1.29 possibly falsely elevated due to calcified vessels.   Pleural effusion    a. L pleural eff after CABG s/p thoracentesis 08/22/12.   Prostate cancer [facility])    Status post radiation treatment.   Prostatic hypertrophy    a. Hx of urinary retention, awaiting TURP.   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 Mild  MR  by TEE 08/2012.   Past Surgical History:  Past Surgical History:  Procedure Laterality Date   APPENDECTOMY     BACK SURGERY     CORONARY ARTERY BYPASS GRAFT  08/16/2012   Procedure: CORONARY ARTERY BYPASS GRAFTING (CABG);  Surgeon: Elspeth JAYSON Millers, MD;  Location: [facility] OR;  Service: Open Heart Surgery;  Laterality: N/A;   IR REPLC GASTRO/COLONIC TUBE PERCUT W/FLUORO  02/13/2019   LEFT HEART CATHETERIZATION WITH CORONARY ANGIOGRAM N/A 08/14/2012   Procedure: LEFT HEART CATHETERIZATION WITH CORONARY ANGIOGRAM;  Surgeon: Peter M Jordan, MD;  Location: [facility] CATH LAB;  Service: Cardiovascular;  Laterality: N/A;   HPI:  [name] is a 78 y.o. male with medical history significant of hypertension, hyperlipidemia, diabetes, CKD 3B, PAD, GERD, CVA, CAD, A-fib, DVT, dementia, depression, RLS presented as a code stroke from [facility] nursing center on 11/28/24 due to decreased verbal responsiveness and left-sided weakness.    Was seen today after he noted to have some left-sided weakness and decreased verbal responsiveness.  Poor Po intake.   Has some baseline right-sided deficits from prior stroke. Pt is a retired chartered loss adjuster.  ST consulted for clinical swallow evaluation.   Assessment / Plan / Recommendation  Clinical Impression  Recommend Dysphagia 3(mechanical soft)/thin liquid diet with cup sips only (Provale cup may be beneficial during stay), but no straws and medications provided whole or crushed in puree for safety. Pt requires FULL supervision/A with self-feeding during all po intake.  Pt exhibits a cognitive-based dysphagia c/b impulsivity with volume of liquids/bites of solid foods if independently consuming po intake.  Pt with impaired mastication d/t limited dentition and requiring cues for bite size.  Pt with slow mastication, but oral clearance adequate with preferred foods. Delayed coughing noted post intake after all consistencies consumed.  Pt had a MBS on 4/25 with recommendation for  Dysphagia 3/thin via cup only.  He has another MBS ordered for 12/09/24 as an outpatient, but this may be able to be completed during hospital stay if SLP schedule permits.  ST will f/u in acute setting for dysphagia tx/management.  SLP Visit Diagnosis: Dysphagia, oropharyngeal phase (R13.12)    Aspiration Risk  Mild aspiration risk;Moderate aspiration risk    Diet Recommendation   Thin;Dysphagia 3 (mechanical soft)  Medication Administration: Whole meds with puree (or crushed prn)    Other Recommendations Oral Care Recommendations: Oral care BID;Staff/trained caregiver to provide oral care     Swallow Evaluation Recommendations  PO diet (mechanical soft/D3 with thin liquids provided by cup only, no straws)   Assistance Recommended at Discharge  Full  Functional Status Assessment Patient has had a recent decline in their functional status and demonstrates the ability to make significant improvements in function in a reasonable and predictable amount of time.  Frequency and Duration min 2x/week  1 week       Prognosis Prognosis for improved oropharyngeal function: Good Barriers to Reach Goals: Cognitive deficits      Swallow Study   General Date of Onset: 11/28/24 HPI: [name] is a 78 y.o. male with medical history significant of hypertension, hyperlipidemia, diabetes, CKD 3B, PAD, GERD, CVA, CAD, A-fib, DVT, dementia, depression, RLS presenting as a code stroke from [facility] nursing center due to decreased verbal responsiveness and left-sided weakness.     Patient resides at [facility] nursing center.  Was seen today after he noted to have some left-sided weakness and decreased verbal responsiveness.  Was not eating breakfast either.     Has some baseline right-sided deficits from prior stroke. Type of Study: Bedside Swallow Evaluation Previous Swallow Assessment: 4/25 with recs for Dysphagia 3/thin via cup, no straws; has MBS scheduled for 12/09/24 as an outpatient Diet Prior to this  Study: NPO Temperature Spikes Noted: No Respiratory Status: Room air History of Recent Intubation: No Behavior/Cognition: Alert;Distractible;Requires cueing Oral Cavity Assessment: Dry Oral Care Completed by SLP: Yes (limited d/t pt refusal) Oral Cavity - Dentition: Missing dentition Self-Feeding Abilities: Needs assist Patient Positioning: Upright in bed Baseline Vocal Quality: Low vocal intensity Volitional Cough: Strong Volitional Swallow: Unable to elicit    Oral/Motor/Sensory Function Overall Oral Motor/Sensory Function: Generalized oral weakness (limited assessment d/t pt compliance)   Ice Chips Ice chips: Impaired Presentation: Spoon Oral Phase Impairments: Reduced lingual movement/coordination Oral Phase Functional Implications: Oral holding Pharyngeal Phase Impairments: Other (comments) (pt expelled from oral cavity)   Thin Liquid Thin Liquid: Impaired Presentation: Cup;Self Fed Oral Phase Functional Implications: Oral holding Pharyngeal  Phase Impairments: Cough - Delayed Other Comments: wife stated he uses an adult sippy cup at [facility] center and has intermittent coughing with liquids    Nectar Thick Nectar Thick Liquid: Not tested   Honey Thick Honey Thick Liquid: Not tested   Puree Puree: Within functional limits Presentation: Spoon   Solid     Solid: Impaired Presentation: Self Fed Oral Phase Impairments: Other (comment);Impaired mastication (impulsive) Oral Phase Functional Implications: Impaired mastication      Pat Elisabella Hacker,M.S.,CCC-SLP 11/29/2024,12:16 PM

## 2024-11-30 DIAGNOSIS — I2690 Septic pulmonary embolism without acute cor pulmonale: Secondary | ICD-10-CM | POA: Diagnosis not present

## 2024-11-30 DIAGNOSIS — I2699 Other pulmonary embolism without acute cor pulmonale: Secondary | ICD-10-CM | POA: Diagnosis not present

## 2024-11-30 LAB — RENAL FUNCTION PANEL
Albumin: 3.2 g/dL — ABNORMAL LOW (ref 3.5–5.0)
Anion gap: 7 (ref 5–15)
BUN: 30 mg/dL — ABNORMAL HIGH (ref 8–23)
CO2: 29 mmol/L (ref 22–32)
Calcium: 9.2 mg/dL (ref 8.9–10.3)
Chloride: 98 mmol/L (ref 98–111)
Creatinine, Ser: 1.68 mg/dL — ABNORMAL HIGH (ref 0.61–1.24)
GFR, Estimated: 41 mL/min — ABNORMAL LOW
Glucose, Bld: 296 mg/dL — ABNORMAL HIGH (ref 70–99)
Phosphorus: 2.1 mg/dL — ABNORMAL LOW (ref 2.5–4.6)
Potassium: 4.5 mmol/L (ref 3.5–5.1)
Sodium: 134 mmol/L — ABNORMAL LOW (ref 135–145)

## 2024-11-30 LAB — CBC
HCT: 48.1 % (ref 39.0–52.0)
Hemoglobin: 15.7 g/dL (ref 13.0–17.0)
MCH: 30.7 pg (ref 26.0–34.0)
MCHC: 32.6 g/dL (ref 30.0–36.0)
MCV: 94.1 fL (ref 80.0–100.0)
Platelets: 171 K/uL (ref 150–400)
RBC: 5.11 MIL/uL (ref 4.22–5.81)
RDW: 12.3 % (ref 11.5–15.5)
WBC: 8.3 K/uL (ref 4.0–10.5)
nRBC: 0 % (ref 0.0–0.2)

## 2024-11-30 LAB — GLUCOSE, CAPILLARY
Glucose-Capillary: 102 mg/dL — ABNORMAL HIGH (ref 70–99)
Glucose-Capillary: 119 mg/dL — ABNORMAL HIGH (ref 70–99)
Glucose-Capillary: 156 mg/dL — ABNORMAL HIGH (ref 70–99)
Glucose-Capillary: 169 mg/dL — ABNORMAL HIGH (ref 70–99)
Glucose-Capillary: 182 mg/dL — ABNORMAL HIGH (ref 70–99)
Glucose-Capillary: 288 mg/dL — ABNORMAL HIGH (ref 70–99)

## 2024-11-30 LAB — URINALYSIS, COMPLETE (UACMP) WITH MICROSCOPIC
Bilirubin Urine: NEGATIVE
Glucose, UA: >=500 mg/dL — AB
Ketones, ur: NEGATIVE mg/dL
Nitrite: POSITIVE — AB
Protein, ur: 100 mg/dL — AB
RBC / HPF: >50 RBC/hpf (ref 0–5)
Specific Gravity, Urine: 1.02 (ref 1.005–1.030)
WBC, UA: >50 WBC/hpf (ref 0–5)
pH: 6.5 (ref 5.0–8.0)

## 2024-11-30 LAB — HEPARIN LEVEL (UNFRACTIONATED): Heparin Unfractionated: 0.32 [IU]/mL (ref 0.30–0.70)

## 2024-11-30 LAB — MRSA NEXT GEN BY PCR, NASAL: MRSA by PCR Next Gen: DETECTED — AB

## 2024-11-30 LAB — TYPE AND SCREEN
ABO/RH(D): A POS
Antibody Screen: NEGATIVE

## 2024-11-30 MED ORDER — MUPIROCIN 2 % EX OINT
1.0000 | TOPICAL_OINTMENT | Freq: Two times a day (BID) | CUTANEOUS | Status: DC
  Administered 2024-11-30 – 2024-12-02 (×4): 1 via NASAL
  Filled 2024-11-30: qty 22

## 2024-11-30 MED ORDER — APIXABAN 5 MG PO TABS
10.0000 mg | ORAL_TABLET | Freq: Two times a day (BID) | ORAL | Status: DC
  Filled 2024-11-30: qty 2

## 2024-11-30 MED ORDER — APIXABAN 5 MG PO TABS: 5.0000 mg | ORAL_TABLET | Freq: Two times a day (BID) | ORAL | Status: DC

## 2024-11-30 MED ORDER — K PHOS MONO-SOD PHOS DI & MONO 155-852-130 MG PO TABS
250.0000 mg | ORAL_TABLET | Freq: Four times a day (QID) | ORAL | Status: AC
  Administered 2024-11-30 – 2024-12-01 (×3): 250 mg via ORAL
  Filled 2024-11-30 (×4): qty 1

## 2024-11-30 MED ORDER — HEPARIN (PORCINE) 25000 UT/250ML-% IV SOLN
850.0000 [IU]/h | INTRAVENOUS | Status: DC
  Administered 2024-11-30: 800 [IU]/h via INTRAVENOUS
  Administered 2024-12-02: 850 [IU]/h via INTRAVENOUS
  Filled 2024-11-30 (×2): qty 250

## 2024-11-30 MED ORDER — CHLORHEXIDINE GLUCONATE CLOTH 2 % EX PADS
6.0000 | MEDICATED_PAD | Freq: Every day | CUTANEOUS | Status: DC
  Administered 2024-12-01: 6 via TOPICAL

## 2024-11-30 NOTE — Progress Notes (Signed)
 Speech Language Pathology Treatment: Dysphagia  Patient Details Name: [name] MRN: [id number] DOB: Jul 07, 1946 Today's Date: 11/30/2024 Time: 8387-8371 SLP Time Calculation (min) (ACUTE ONLY): 16 min  Assessment / Plan / Recommendation Clinical Impression  Pt seen with thin liquid trials using Provale cup, pt had no overt s/s of aspiration using provale cup and should consume thins using the cup to reduce likelihood of aspiration. ST to continue to f/u.  Pt was repositioned with assistance from nursing for optimal PO intake. Per his wife, he does not like to drink water  and he was said to have been very dehydrated upon arrival. Pt and pt's wife given education on the use and purpose of the provale cup. Pt consumed x10 trials of thins via provale with no overt s/s of aspiration and a consistently clear voice. He required verbal support to continue with trials because he said it tastes terrible. Pt's wife educated on Porvale cup and given instructions for carryover of use. ST to f/u for dysphagia management.    HPI HPI: [name] is a 78 y.o. male with medical history significant of hypertension, hyperlipidemia, diabetes, CKD 3B, PAD, GERD, CVA, CAD, A-fib, DVT, dementia, depression, RLS presenting as a code stroke from [facility] nursing center due to decreased verbal responsiveness and left-sided weakness.     Patient resides at [facility] nursing center.  Was seen today after he noted to have some left-sided weakness and decreased verbal responsiveness.  Was not eating breakfast either.     Has some baseline right-sided deficits from prior stroke.      SLP Plan  Continue with current plan of care        Swallow Evaluation Recommendations   Recommendations: PO diet PO Diet Recommendation: Dysphagia 3 (Mechanical soft);Thin liquids (Level 0) Liquid Administration via: Other (Comment) (thins administered via provale cup) Medication Administration: Whole meds with liquid Supervision: Full  assist for feeding;Full supervision/cueing for swallowing strategies Postural changes: Position pt fully upright for meals;Stay upright 30-60 min after meals Oral care recommendations: Oral care QID (4x/day)     Recommendations                     Oral care QID;Staff/trained caregiver to provide oral care     Dysphagia, oropharyngeal phase (R13.12)     Continue with current plan of care     [name] M.S. CCC-SLP

## 2024-11-30 NOTE — Progress Notes (Signed)
 PHARMACY - ANTICOAGULATION CONSULT NOTE  Pharmacy Consult for Heparin  Indication: pulmonary embolus  Allergies[1]  Patient Measurements: Height: 5' 10 (177.8 cm) Weight: 73.1 kg (161 lb 2.5 oz) IBW/kg (Calculated) : 73 HEPARIN  DW (KG): 73.1  Vital Signs: Temp: 97.7 F (36.5 C) (12/20 0437) Temp Source: Axillary (12/20 0437) BP: 141/76 (12/20 0437) Pulse Rate: 74 (12/20 0437)  Labs: Recent Labs    11/28/24 1106 11/28/24 1111 11/28/24 1150 11/28/24 2129 11/29/24 0936 11/29/24 1853 11/30/24 0438  HGB 16.7 16.4  --   --   --   --  15.7  HCT 49.0 50.7  --   --   --   --  48.1  PLT  --  182  --   --   --   --  171  APTT  --  30  --   --   --   --   --   LABPROT  --  14.8  --   --   --   --   --   INR  --  1.1  --   --   --   --   --   HEPARINUNFRC  --   --   --    < > 0.25* 0.20* 0.32  CREATININE 2.10*  --  1.90*  --   --   --   --    < > = values in this interval not displayed.    Estimated Creatinine Clearance: 32 mL/min (A) (by C-G formula based on SCr of 1.9 mg/dL (H)).  Medications:  See home med rec  Assessment: 78 y.o. M presents with stroke. Noted pt with h/o ICH. CTA shows nonobstructive thromboembolic disease along the posterior wall of the left.pulmonary artery. Neurology okay with starting heparin  - low goal, no bolus. No AC PTA. CBC ok on admission.  Heparin  level therapeutic (0.32 on lower end) on infusion at 800 units/hr. Per RN, patient now has some hematuria noticed in tubing of urinary catheter, CBC is stable at this point and patient already no bolus, low goal.   Goal of Therapy:  Heparin  level 0.3-0.5 units/ml Monitor platelets by anticoagulation protocol: Yes   Plan:  Holding heparin  gtt for now Monitor for signs of continued hematuria and follow up heparin  gtt restart Monitor daily heparin  level, CBC, and for s/sx of bleeding   Thank you for allowing pharmacy to participate in this patient's care,  Lynwood Poplar, PharmD, BCPS Clinical  Pharmacist 11/30/2024 5:45 AM        [1] No Known Allergies

## 2024-11-30 NOTE — Progress Notes (Signed)
 PHARMACY - ANTICOAGULATION CONSULT NOTE  Pharmacy Consult for Heparin  Indication: pulmonary embolus  Allergies[1]  Patient Measurements: Height: 5' 10 (177.8 cm) Weight: 73.1 kg (161 lb 2.5 oz) IBW/kg (Calculated) : 73 HEPARIN  DW (KG): 73.1  Vital Signs: Temp: 97.9 F (36.6 C) (12/20 1926) Temp Source: Axillary (12/20 1926) BP: 135/63 (12/20 1926) Pulse Rate: 71 (12/20 1926)  Labs: Recent Labs    11/28/24 1106 11/28/24 1111 11/28/24 1150 11/28/24 2129 11/29/24 0936 11/29/24 1853 11/30/24 0438 11/30/24 1151  HGB 16.7 16.4  --   --   --   --  15.7  --   HCT 49.0 50.7  --   --   --   --  48.1  --   PLT  --  182  --   --   --   --  171  --   APTT  --  30  --   --   --   --   --   --   LABPROT  --  14.8  --   --   --   --   --   --   INR  --  1.1  --   --   --   --   --   --   HEPARINUNFRC  --   --   --    < > 0.25* 0.20* 0.32  --   CREATININE 2.10*  --  1.90*  --   --   --   --  1.68*   < > = values in this interval not displayed.    Estimated Creatinine Clearance: 36.2 mL/min (A) (by C-G formula based on SCr of 1.68 mg/dL (H)).  Medications:  See home med rec  Assessment: 78 y.o. M presents with stroke. Noted pt with h/o ICH. CTA shows nonobstructive thromboembolic disease along the posterior wall of the left.pulmonary artery. Neurology okay with starting heparin  - low goal, no bolus. No AC PTA. CBC ok on admission.  -Patient switched from heparin  gtt to apixaban  today-has not received any apixaban  given refusing PO meds. RN reports still some ongoing hematuria, will continue to monitor and CBC stable. Will restart at 800 units/hr given was therapeutic at lower end of goal earlier this AM   Goal of Therapy:  Heparin  level 0.3-0.5 units/ml Monitor platelets by anticoagulation protocol: Yes   Plan:  Start heparin  at 800 units/hr Monitor for signs of continued hematuria and follow up heparin  gtt restart Monitor daily heparin  level, CBC, and for s/sx of bleeding    Thank you for allowing pharmacy to participate in this patient's care,  Lynwood Poplar, PharmD, BCPS Clinical Pharmacist 11/30/2024 10:25 PM         [1] No Known Allergies

## 2024-11-30 NOTE — Discharge Instructions (Signed)
 Information on my medicine - ELIQUIS  (apixaban )  This medication education was reviewed with me or my healthcare representative as part of my discharge preparation.  The pharmacist that spoke with me during my hospital stay was:  Elma Fail, [facility]  Why was Eliquis  prescribed for you? Eliquis  was prescribed for you to reduce the risk of forming blood clots that can cause a stroke if you have a medical condition called atrial fibrillation (a type of irregular heartbeat) OR to reduce the risk of a blood clots forming after orthopedic surgery.  What do You need to know about Eliquis  ? Take your Eliquis  TWICE DAILY - one tablet in the morning and one tablet in the evening with or without food.  It would be best to take the doses about the same time each day.  If you have difficulty swallowing the tablet whole please discuss with your pharmacist how to take the medication safely.  Take Eliquis  exactly as prescribed by your doctor and DO NOT stop taking Eliquis  without talking to the doctor who prescribed the medication.  Stopping may increase your risk of developing a new clot or stroke.  Refill your prescription before you run out.  After discharge, you should have regular check-up appointments with your healthcare provider that is prescribing your Eliquis .  In the future your dose may need to be changed if your kidney function or weight changes by a significant amount or as you get older.  What do you do if you miss a dose? If you miss a dose, take it as soon as you remember on the same day and resume taking twice daily.  Do not take more than one dose of ELIQUIS  at the same time.  Important Safety Information A possible side effect of Eliquis  is bleeding. You should call your healthcare provider right away if you experience any of the following: Bleeding from an injury or your nose that does not stop. Unusual colored urine (red or dark brown) or unusual colored stools (red or  black). Unusual bruising for unknown reasons. A serious fall or if you hit your head (even if there is no bleeding).  Some medicines may interact with Eliquis  and might increase your risk of bleeding or clotting while on Eliquis . To help avoid this, consult your healthcare provider or pharmacist prior to using any new prescription or non-prescription medications, including herbals, vitamins, non-steroidal anti-inflammatory drugs (NSAIDs) and supplements.  This website has more information on Eliquis  (apixaban ): http://[email or web address] .com/eliquis dena

## 2024-11-30 NOTE — Progress Notes (Signed)
 " PROGRESS NOTE    [name]  FMW:[id number] DOB: [birth year] DOA: 11/28/2024 PCP: Landy Barnie RAMAN, NP  Outpatient Specialists:     Brief Narrative:  Patient is an 78 year old male past medical history significant for hypertension, hyperlipidemia, diabetes, CKD 3B, PAD, GERD, CVA, CAD, A-fib, DVT, dementia, depression and RLS.  Patient was admitted with concerns for repeat CVA with left-sided weakness and decreased verbal responsiveness, however, workup has been negative for stroke.  Patient is awake and alert, and moves left upper and lower extremity.  Presentation is concerning for likely acute toxic metabolic encephalopathy that is resolving.  Pulmonary embolism was found incidentally during the workup.  Patient is currently on heparin  drip.  11/29/2024: Patient seen alongside patient's wife.  Patient is awake and alert.  Patient moves left upper and lower extremities.  Patient hits out from time to time, likely due to behavioral problems associated with dementing illness.  Likely discharge in the next 24 hours.  EEG is negative.  Acute kidney injury on chronic kidney disease stage 3a is noted (baseline serum creatinine of 1.48.  On presentation, serum creatinine was 2.1).  AKI may be secondary to volume depletion.  11/30/2024: Hematuria noted last night.  Heparin  has been on hold.  Will change heparin  to Eliquis .  Continue to monitor closely.  No further hematuria.  No new complaints.   Assessment & Plan:   Principal Problem:   Pulmonary embolism (HCC) Active Problems:   CAD (coronary artery disease)   Peripheral vascular disease (HCC)   Atrial fibrillation with normal ventricular rate (HCC)   History of hemorrhagic cerebrovascular accident (CVA) with residual deficit   Hypertension associated with diabetes (HCC)   Dyslipidemia associated with type 2 diabetes mellitus (HCC)   Type 2 diabetes mellitus with peripheral vascular disease (HCC)   Major depression with psychotic features  (HCC)   RLS (restless legs syndrome)   Vascular dementia without behavioral disturbance (HCC)   GERD without esophagitis   Stage 3b chronic kidney disease (HCC)   Focal neurological deficit   Cerebrovascular accident (CVA) (HCC)   Focal neurologic deficits History of CVA > History of prior hemorrhagic CVA and prior infarct. > Baseline right-sided deficits. > At the facility, patient was found to have decreased left-sided function and was less interactive > Brought as a code stroke, seen by neurology.  CT head showed old infarct and chronic encephalomalacia from prior stroke. > CTA head neck did not show LVO, significant stenosis, aneurysm but did show evidence of PE; addressed below. - Neurology consulted -Workup for stroke so far is negative.  MRI brain is negative for acute stroke. - Continue home statin   - Follow echocardiogram  - Tele monitoring  - SLP eval (has baseline dysphagia issues prior to this event) - PT/OT -EEG was negative.   Acute PE History of DVT > History of DVT, previously on Eliquis .  Completed course has been off Eliquis  since 2023. > Incidental PE noted on CTA head and neck performed for workup of possible stroke as above. - Continue with heparin  - Echocardiogram - Will benefit from CTA PE study, however this has been delayed due to CKD and patient was exposed to to contrast dye earlier. 11/30/2024: See above documentation.  Hypertension - Continue to optimize. - Keep blood pressure less than 130/80 mmHg.   -Last blood pressure 147/84 mmHg.   Hyperlipidemia - Continue rosuvastatin    Diabetes > Continue subcutaneous Lantus  10 units daily, and sliding scale insulin  coverage (0 to 15  units).     Acute kidney injury on chronic kidney disease 3A versus progression to CKD 3B: > Baseline serum creatinine of 1.48. - Admitted with serum creatinine of 2.10. - Last serum creatinine was 1.90. - Avoid nephrotoxins. - Adequately hydrate patient. - Keep MAP  greater than 65 mmHg. - Avoid nephrotoxins.   -Suspect secondary to volume depletion. 11/30/2024: Serum creatinine has improved to 1.68.  Continue to monitor renal function closely.  Phosphorus of 2.1.  Monitor and replace phosphorus.   PAD - On heparin  for acute PE.  -Stable   CAD - On heparin  for acute PE. - Stable.  - Continue home rosuvastatin    A-fib - On heparin  for now   Depression Dementia RLS - Noted  Hypophosphatemia: - Replete.   DVT prophylaxis: Subcutaneous heparin  Code Status: DO NOT RESUSCITATE Family Communication: Wife by bedside Disposition Plan: Likely discharge back to SNF   Consultants:  Neurology  Procedures:  EEG  Antimicrobials:  None   Subjective: No history from patient.  Objective: Vitals:   11/30/24 0437 11/30/24 0721 11/30/24 0748 11/30/24 1124  BP: (!) 141/76 (!) 146/76 (!) 146/76 136/70  Pulse: 74 70 71 75  Resp: 20 17 20 18   Temp: 97.7 F (36.5 C) 98.2 F (36.8 C) 98.3 F (36.8 C) 98.6 F (37 C)  TempSrc: Axillary Oral Axillary Oral  SpO2: 90% 96% 95% 94%  Weight:      Height:        Intake/Output Summary (Last 24 hours) at 11/30/2024 1734 Last data filed at 11/30/2024 1602 Gross per 24 hour  Intake 783.97 ml  Output 80 ml  Net 703.97 ml   Filed Weights   11/28/24 1100 11/28/24 1121 11/29/24 1533  Weight: 74.4 kg 74.4 kg 73.1 kg    Examination:  General exam: Appears calm and comfortable.  Mild discharge from left eye has improved.  Patient is more interactive today. Respiratory system: Clear to auscultation. Respiratory effort normal. Cardiovascular system: S1 & S2 heard Gastrointestinal system: Abdomen is soft and nontender. No organomegaly or masses felt. Normal bowel sounds heard. Central nervous system: Awake and alert.  Patient is demented.  Right-sided hemiplegia (old).  Patient moves left upper and lower extremity.    Data Reviewed: I have personally reviewed following labs and imaging  studies  CBC: Recent Labs  Lab 11/28/24 1106 11/28/24 1111 11/30/24 0438  WBC  --  9.9 8.3  NEUTROABS  --  5.4  --   HGB 16.7 16.4 15.7  HCT 49.0 50.7 48.1  MCV  --  95.1 94.1  PLT  --  182 171   Basic Metabolic Panel: Recent Labs  Lab 11/28/24 1106 11/28/24 1150 11/30/24 1151  NA 143 140 134*  K 5.3* 4.4 4.5  CL 106 106 98  CO2  --  28 29  GLUCOSE 153* 135* 296*  BUN 54* 41* 30*  CREATININE 2.10* 1.90* 1.68*  CALCIUM   --  9.4 9.2  PHOS  --   --  2.1*   GFR: Estimated Creatinine Clearance: 36.2 mL/min (A) (by C-G formula based on SCr of 1.68 mg/dL (H)). Liver Function Tests: Recent Labs  Lab 11/28/24 1150 11/30/24 1151  AST 23  --   ALT 15  --   ALKPHOS 96  --   BILITOT 0.6  --   PROT 5.9*  --   ALBUMIN  3.1* 3.2*   No results for input(s): LIPASE, AMYLASE in the last 168 hours. No results for input(s): AMMONIA in the last  168 hours. Coagulation Profile: Recent Labs  Lab 11/28/24 1111  INR 1.1   Cardiac Enzymes: No results for input(s): CKTOTAL, CKMB, CKMBINDEX, TROPONINI in the last 168 hours. BNP (last 3 results) No results for input(s): PROBNP in the last 8760 hours. HbA1C: No results for input(s): HGBA1C in the last 72 hours. CBG: Recent Labs  Lab 11/30/24 0031 11/30/24 0450 11/30/24 0749 11/30/24 1122 11/30/24 1656  GLUCAP 156* 102* 119* 288* 182*   Lipid Profile: No results for input(s): CHOL, HDL, LDLCALC, TRIG, CHOLHDL, LDLDIRECT in the last 72 hours. Thyroid  Function Tests: No results for input(s): TSH, T4TOTAL, FREET4, T3FREE, THYROIDAB in the last 72 hours. Anemia Panel: No results for input(s): VITAMINB12, FOLATE, FERRITIN, TIBC, IRON, RETICCTPCT in the last 72 hours. Urine analysis:    Component Value Date/Time   COLORURINE AMBER (A) 11/30/2024 1619   APPEARANCEUR CLOUDY (A) 11/30/2024 1619   LABSPEC 1.020 11/30/2024 1619   PHURINE 6.5 11/30/2024 1619   GLUCOSEU >=500 (A)  11/30/2024 1619   HGBUR LARGE (A) 11/30/2024 1619   BILIRUBINUR NEGATIVE 11/30/2024 1619   KETONESUR NEGATIVE 11/30/2024 1619   PROTEINUR 100 (A) 11/30/2024 1619   UROBILINOGEN 1.0 08/23/2012 1732   NITRITE POSITIVE (A) 11/30/2024 1619   LEUKOCYTESUR MODERATE (A) 11/30/2024 1619   Sepsis Labs: @LABRCNTIP (procalcitonin:4,lacticidven:4)  ) Recent Results (from the past 240 hours)  MRSA Next Gen by PCR, Nasal     Status: Abnormal   Collection Time: 11/30/24  4:03 PM   Specimen: Nasal Mucosa; Nasal Swab  Result Value Ref Range Status   MRSA by PCR Next Gen DETECTED (A) NOT DETECTED Final    Comment: (NOTE) The GeneXpert MRSA Assay (FDA approved for NASAL specimens only), is one component of a comprehensive MRSA colonization surveillance program. It is not intended to diagnose MRSA infection nor to guide or monitor treatment for MRSA infections. Test performance is not FDA approved in patients less than 72 years old. Performed at [facility], [address], [place], [place] [address]          Radiology Studies: ECHOCARDIOGRAM COMPLETE Result Date: 11/29/2024    ECHOCARDIOGRAM REPORT   Patient Name:   DURWIN DAVISSON Date of Exam: 11/29/2024 Medical Rec #:  [id number]      Height:       68.5 in Accession #:    [id number]     Weight:       164.0 lb Date of Birth:  02/18/1946       BSA:          1.889 m Patient Age:    78 years       BP:           129/74 mmHg Patient Gender: M              HR:           71 bpm. Exam Location:  Inpatient Procedure: 2D Echo, Cardiac Doppler, Color Doppler and Intracardiac            Opacification Agent (Both Spectral and Color Flow Doppler were            utilized during procedure). Indications:    Pulmonary Embolus  History:        Patient has no prior history of Echocardiogram examinations.                 CAD; Risk Factors:Hypertension and Diabetes. CKD.  Sonographer:    Philomena Daring Referring Phys: MARSA NOVAK MELVIN  Sonographer Comments: Image  acquisition challenging due to uncooperative patient. IMPRESSIONS  1. Left ventricular ejection fraction, by estimation, is 55 to 60%. The left ventricle has normal function. The left ventricle has no regional wall motion abnormalities. There is mild left ventricular hypertrophy. Left ventricular diastolic parameters are consistent with Grade I diastolic dysfunction (impaired relaxation).  2. Right ventricular systolic function was not well visualized. The right ventricular size is not well visualized.  3. The mitral valve is normal in structure. Trivial mitral valve regurgitation. No evidence of mitral stenosis.  4. The aortic valve was not well visualized. Aortic valve regurgitation is trivial. No aortic stenosis is present. FINDINGS  Left Ventricle: Left ventricular ejection fraction, by estimation, is 55 to 60%. The left ventricle has normal function. The left ventricle has no regional wall motion abnormalities. Definity  contrast agent was given IV to delineate the left ventricular  endocardial borders. The left ventricular internal cavity size was small. There is mild left ventricular hypertrophy. Left ventricular diastolic parameters are consistent with Grade I diastolic dysfunction (impaired relaxation). Right Ventricle: The right ventricular size is not well visualized. Right vetricular wall thickness was not well visualized. Right ventricular systolic function was not well visualized. Left Atrium: Left atrial size was normal in size. Right Atrium: Right atrial size was normal in size. Pericardium: There is no evidence of pericardial effusion. Mitral Valve: The mitral valve is normal in structure. Trivial mitral valve regurgitation. No evidence of mitral valve stenosis. Tricuspid Valve: The tricuspid valve is normal in structure. Tricuspid valve regurgitation is trivial. Aortic Valve: The aortic valve was not well visualized. Aortic valve regurgitation is trivial. No aortic stenosis is present. Pulmonic  Valve: The pulmonic valve was not well visualized. Pulmonic valve regurgitation is not visualized. Aorta: The aortic root is normal in size and structure. IAS/Shunts: The interatrial septum was not well visualized.  LEFT VENTRICLE PLAX 2D LVIDd:         3.80 cm   Diastology LVIDs:         2.70 cm   LV e' medial:    3.59 cm/s LV PW:         1.10 cm   LV E/e' medial:  12.6 LV IVS:        1.20 cm   LV e' lateral:   7.07 cm/s LVOT diam:     2.00 cm   LV E/e' lateral: 6.4 LV SV:         58 LV SV Index:   31 LVOT Area:     3.14 cm  RIGHT VENTRICLE RV S prime:     8.16 cm/s TAPSE (M-mode): 1.1 cm LEFT ATRIUM             Index        RIGHT ATRIUM           Index LA diam:        3.60 cm 1.91 cm/m   RA Area:     13.80 cm LA Vol (A2C):   36.2 ml 19.17 ml/m  RA Volume:   26.30 ml  13.92 ml/m LA Vol (A4C):   56.5 ml 29.91 ml/m LA Biplane Vol: 45.1 ml 23.88 ml/m  AORTIC VALVE LVOT Vmax:   93.80 cm/s LVOT Vmean:  59.200 cm/s LVOT VTI:    0.185 m  AORTA Ao Root diam: 3.70 cm MITRAL VALVE MV Area (PHT): 3.31 cm     SHUNTS MV Decel Time: 229 msec     Systemic VTI:  0.18  m MV E velocity: 45.10 cm/s   Systemic Diam: 2.00 cm MV A velocity: 104.00 cm/s MV E/A ratio:  0.43 Lonni Nanas MD Electronically signed by Lonni Nanas MD Signature Date/Time: 11/29/2024/3:02:19 PM    Final    EEG adult Result Date: 11/29/2024 Shelton Arlin KIDD, MD     11/29/2024 10:03 AM Patient Name: BRAXON SUDER MRN: [id number] Epilepsy Attending: Arlin KIDD Shelton Referring Physician/Provider: Michaela Aisha SQUIBB, MD Date: 11/29/2024 Duration: 25.52 mins Patient history: 78yo M with decreased left-sided function and was less interactive. EEG to evaluate for seizure Level of alertness: Awake AEDs during EEG study: None Technical aspects: This EEG study was done with scalp electrodes positioned according to the 10-20 International system of electrode placement. Electrical activity was reviewed with band pass filter of 1-70Hz ,  sensitivity of 7 uV/mm, display speed of 77mm/sec with a 60Hz  notched filter applied as appropriate. EEG data were recorded continuously and digitally stored.  Video monitoring was available and reviewed as appropriate. Description: The posterior dominant rhythm consists of 7 Hz activity of moderate voltage (25-35 uV) seen predominantly in posterior head regions, symmetric and reactive to eye opening and eye closing. EEG showed continuous generalized and lateralized left hemisphere 5 to 7 Hz theta slowing admixed with intermittent 2-3hz  delta slowing. Hyperventilation and photic stimulation were not performed.   ABNORMALITY - Continuous slow, generalized and lateralized left hemisphere IMPRESSION: This study is suggestive of cortical dysfunction arising from left hemisphere likely secondary to underlying structural abnormality. Additionally there is generalized cerebral dysfunction (encephalopathy). No seizures or epileptiform discharges were seen throughout the recording. Priyanka O Yadav        Scheduled Meds:  apixaban   10 mg Oral BID   Followed by   NOREEN ON 12/07/2024] apixaban   5 mg Oral BID   FLUoxetine   10 mg Oral Daily   insulin  aspart  0-15 Units Subcutaneous Q4H   insulin  glargine  10 Units Subcutaneous Daily   rosuvastatin   10 mg Oral QHS   trimethoprim -polymyxin b   1 drop Left Eye Q4H   Continuous Infusions:     LOS: 2 days    Time spent: 35 minutes    Leatrice Chapel, MD  [facility] Pager #: [phone] 7PM-7AM contact night coverage as above    "

## 2024-11-30 NOTE — Progress Notes (Signed)
 Provider was notified around 11:14 am that the pt's heparin  gtt was stopped & held around 06:01 am by night shift provider as well as RN d/t new onset hematuria. Per provider continue to hold & he will contact pharmacy. New orders to start pt on eliquis  around 1325. Will continue to monitor the pt. Tyrone Balash R, RN

## 2024-11-30 NOTE — Progress Notes (Incomplete)
 [facility] night cross cover note:   I was notified by the patient's RN that this patient, whose hospital course is associated with acute pulmonary embolism, is refusing his evening oral medications, including his Eliquis .  Per brief chart review, day hospitalist documented plan was for heparin  drip.  Consistent with the patient's wishes and consistent with documented plan, I have discontinued order for Eliquis , and placed order for pharmacy consult for assistance with initiation of heparin  drip.   Eva Pore, DO Hospitalist

## 2024-11-30 NOTE — Progress Notes (Signed)
 [facility] night cross cover note:   I was notified by the patient's RN  of new onset gross hematuria via patient's condom cath. Patient currently on heparin  drip for incidental PE, which I asked to be held (as of 6 AM) pending further evaluation of the gross hematuria. I've ordered UA, type and screen.   Vital signs appear stable, with most recent heart rates in the 70s, and systolic blood pressures in the 140s.  Hemoglobin level this morning is 15.7.   Eva Pore, DO Hospitalist

## 2024-11-30 NOTE — Progress Notes (Signed)
 Patient takes medication crushed in applesauce.  However, when medications was crushed and placed in applesauce he refused them.  He knocked the spoon in the floor and ask nurse to get out of the room. Eva KATHEE Pore, DO notified. Patient was educated about the importance of taking his medication. And about how he could develop a blood clot. No new orders at this time.

## 2024-11-30 NOTE — Plan of Care (Signed)
" °  Problem: Education: Goal: Knowledge of disease or condition will improve Outcome: Progressing Goal: Knowledge of secondary prevention will improve (MUST DOCUMENT ALL) Outcome: Progressing Goal: Knowledge of patient specific risk factors will improve (DELETE if not current risk factor) Outcome: Progressing   Problem: Ischemic Stroke/TIA Tissue Perfusion: Goal: Complications of ischemic stroke/TIA will be minimized Outcome: Progressing  Information & education given to the pt & his wife at bedside. Whom verbalized understanding "

## 2024-12-01 ENCOUNTER — Other Ambulatory Visit (HOSPITAL_COMMUNITY): Payer: Self-pay

## 2024-12-01 DIAGNOSIS — I2699 Other pulmonary embolism without acute cor pulmonale: Secondary | ICD-10-CM | POA: Diagnosis not present

## 2024-12-01 LAB — HEPARIN LEVEL (UNFRACTIONATED)
Heparin Unfractionated: 0.23 [IU]/mL — ABNORMAL LOW (ref 0.30–0.70)
Heparin Unfractionated: 0.36 [IU]/mL (ref 0.30–0.70)

## 2024-12-01 LAB — GLUCOSE, CAPILLARY
Glucose-Capillary: 134 mg/dL — ABNORMAL HIGH (ref 70–99)
Glucose-Capillary: 178 mg/dL — ABNORMAL HIGH (ref 70–99)
Glucose-Capillary: 184 mg/dL — ABNORMAL HIGH (ref 70–99)
Glucose-Capillary: 190 mg/dL — ABNORMAL HIGH (ref 70–99)
Glucose-Capillary: 204 mg/dL — ABNORMAL HIGH (ref 70–99)
Glucose-Capillary: 237 mg/dL — ABNORMAL HIGH (ref 70–99)

## 2024-12-01 LAB — CBC
HCT: 46.2 % (ref 39.0–52.0)
Hemoglobin: 15.7 g/dL (ref 13.0–17.0)
MCH: 31.3 pg (ref 26.0–34.0)
MCHC: 34 g/dL (ref 30.0–36.0)
MCV: 92.2 fL (ref 80.0–100.0)
Platelets: 145 K/uL — ABNORMAL LOW (ref 150–400)
RBC: 5.01 MIL/uL (ref 4.22–5.81)
RDW: 12.2 % (ref 11.5–15.5)
WBC: 7.7 K/uL (ref 4.0–10.5)
nRBC: 0 % (ref 0.0–0.2)

## 2024-12-01 NOTE — Progress Notes (Signed)
 CSW attempted to reach pt's wife about discharge planning needs. It appears that pt is a LTC resident at [facility]. CSW left voicemail and awaiting a call back.  TOC team will continue to assist with discharge planning needs.

## 2024-12-01 NOTE — TOC Initial Note (Signed)
 Transition of Care [facility]) - Initial/Assessment Note    Patient Details  Name: [name] MRN: [id number] Date of Birth: 06/10/1946  [facility] CM/SW Contact:    Olam FORBES Ally, LCSW Phone Number: 12/01/2024, 10:14 AM  Clinical Narrative:                  CSW received a return call from pt's spouse and she confirmed that pt is a LTC resident at the [facility]. Pt's spouse reports that he has been there six years and the plan is for him to return when he is medically ready. Pt's spouse request that if she doesn't answer the phone to leave a voice mail  and she will call back. CSW informed pt's spouse that she will be updated when pt will return to the [facility]. Patient will need PTAR for transport.  TOC team will continue to assist with discharge planning needs.        Patient Goals and CMS Choice            Expected Discharge Plan and Services       Living arrangements for the past 2 months: Skilled Nursing Facility                                      Prior Living Arrangements/Services Living arrangements for the past 2 months: Skilled Nursing Facility Lives with:: Facility Resident Patient language and need for interpreter reviewed:: Yes                 Activities of Daily Living   ADL Screening (condition at time of admission) Independently performs ADLs?: No Does the patient have a NEW difficulty with bathing/dressing/toileting/self-feeding that is expected to last >3 days?: Yes (Initiates electronic notice to provider for possible OT consult) Does the patient have a NEW difficulty with getting in/out of bed, walking, or climbing stairs that is expected to last >3 days?: No Does the patient have a NEW difficulty with communication that is expected to last >3 days?: Yes (Initiates electronic notice to provider for possible SLP consult) Is the patient deaf or have difficulty hearing?: No Does the patient have difficulty seeing, even  when wearing glasses/contacts?: No Does the patient have difficulty concentrating, remembering, or making decisions?: Yes  Permission Sought/Granted                  Emotional Assessment Appearance:: Other (Comment Required (spoke with wife) Attitude/Demeanor/Rapport: Unable to Assess Affect (typically observed): Unable to Assess Orientation: : Fluctuating Orientation (Suspected and/or reported Sundowners)      Admission diagnosis:  Pulmonary embolism (HCC) [I26.99] Cerebrovascular accident (CVA), unspecified mechanism (HCC) [I63.9] Pulmonary embolism without acute cor pulmonale, unspecified chronicity, unspecified pulmonary embolism type (HCC) [I26.99] Patient Active Problem List   Diagnosis Date Noted   Cerebrovascular accident (CVA) (HCC) 11/29/2024   Focal neurological deficit 11/28/2024   Pulmonary embolism (HCC) 11/28/2024   Adult failure to thrive syndrome 02/28/2024   Somnolence 02/26/2024   Chronic constipation 08/22/2023   Chronic generalized pain 05/30/2023   Stage 3b chronic kidney disease (HCC) 03/28/2023   GERD without esophagitis 02/22/2023   Vascular dementia without behavioral disturbance (HCC) 12/16/2022   Osteopenia 07/29/2022   History of DVT (deep vein thrombosis) 04/28/2022   RLS (restless legs syndrome) 07/13/2021   Bladder spasms 02/12/2021   Aortic atherosclerosis 12/17/2020   Expressive aphasia 11/18/2020  Right spastic hemiplegia (HCC) 05/27/2020   Microalbuminuria due to type 2 diabetes mellitus (HCC) 06/04/2019   Hypertension associated with diabetes (HCC) 01/28/2019   Dyslipidemia associated with type 2 diabetes mellitus (HCC) 01/28/2019   Type 2 diabetes mellitus with peripheral vascular disease (HCC) 01/28/2019   Major depression with psychotic features (HCC) 01/28/2019   Protein-calorie malnutrition, severe 01/08/2019   History of hemorrhagic cerebrovascular accident (CVA) with residual deficit 01/04/2019   Urinary retention 12/24/2018    Nontraumatic subcortical hemorrhage of left cerebral hemisphere [facility]) 11/18/2018   CAD (coronary artery disease)    Peripheral vascular disease (HCC)    Atrial fibrillation with normal ventricular rate (HCC)    Essential hypertension 08/15/2012   Mixed dyslipidemia 08/15/2012   PCP:  Landy Barnie RAMAN, NP Pharmacy:   [facility] - [place], [place] - [address] [place] [address] Phone: [phone] Fax: [phone]     Social Drivers of Health (SDOH) Social History: SDOH Screenings   Food Insecurity: No Food Insecurity (11/29/2024)  Housing: Low Risk (11/29/2024)  Transportation Needs: No Transportation Needs (11/29/2024)  Utilities: Not At Risk (11/29/2024)  Depression (PHQ2-9): Low Risk (09/29/2023)  Social Connections: Moderately Integrated (11/29/2024)  Tobacco Use: Medium Risk (11/29/2024)   SDOH Interventions:     Readmission Risk Interventions     No data to display

## 2024-12-01 NOTE — Progress Notes (Signed)
 PHARMACY - ANTICOAGULATION CONSULT NOTE  Pharmacy Consult for Heparin  Indication: pulmonary embolus  Allergies[1]  Patient Measurements: Height: 5' 10 (177.8 cm) Weight: 73.1 kg (161 lb 2.5 oz) IBW/kg (Calculated) : 73 HEPARIN  DW (KG): 73.1  Vital Signs: Temp: 98.6 F (37 C) (12/21 2031) Temp Source: Oral (12/21 2031) BP: 133/73 (12/21 2031) Pulse Rate: 88 (12/21 2031)  Labs: Recent Labs    11/30/24 0438 11/30/24 1151 12/01/24 0547 12/01/24 2237  HGB 15.7  --  15.7  --   HCT 48.1  --  46.2  --   PLT 171  --  145*  --   HEPARINUNFRC 0.32  --  0.23* 0.36  CREATININE  --  1.68*  --   --     Estimated Creatinine Clearance: 36.2 mL/min (A) (by C-G formula based on SCr of 1.68 mg/dL (H)).  Medications:  See home med rec  Assessment: 78 y.o. M presents with stroke. Noted pt with h/o ICH. CTA shows nonobstructive thromboembolic disease along the posterior wall of the left.pulmonary artery. Neurology okay with starting heparin  - low goal, no bolus. No AC PTA. CBC ok on admission.  -PM: heparin  level at goal on 850 units/hr. Per RN, still continues to have hematuria, but unchanged and last CBC stable. No issues running continuously or other signs/symptoms of bleeding.  Goal of Therapy:  Heparin  level 0.3-0.5 units/ml Monitor platelets by anticoagulation protocol: Yes   Plan:  Continue heparin  at 850 units/hr Confirmatory heparin  level with AM labs Monitor for signs of continued hematuria  Monitor daily heparin  level, CBC, and for s/sx of bleeding   Thank you for allowing pharmacy to participate in this patient's care,  Lynwood Poplar, PharmD, BCPS Clinical Pharmacist 12/01/2024 11:13 PM          [1] No Known Allergies

## 2024-12-01 NOTE — Plan of Care (Signed)
" °  Problem: Education: Goal: Knowledge of disease or condition will improve Outcome: Progressing Note: Education given to pt's wife [name] Goal: Knowledge of secondary prevention will improve (MUST DOCUMENT ALL) Outcome: Progressing Goal: Knowledge of patient specific risk factors will improve (DELETE if not current risk factor) Outcome: Progressing   "

## 2024-12-01 NOTE — Progress Notes (Signed)
 " PROGRESS NOTE    [name]  FMW:[id number] DOB: [birth year] DOA: 11/28/2024 PCP: Landy Barnie RAMAN, NP  Outpatient Specialists:     Brief Narrative:  Patient is an 78 year old male past medical history significant for hypertension, hyperlipidemia, diabetes, CKD 3B, PAD, GERD, CVA, CAD, A-fib, DVT, dementia, depression and RLS.  Patient was admitted with concerns for repeat CVA with left-sided weakness and decreased verbal responsiveness, however, workup has been negative for stroke.  Patient is awake and alert, and moves left upper and lower extremity.  Presentation is concerning for likely acute toxic metabolic encephalopathy that is resolving.  Pulmonary embolism was found incidentally during the workup.  Patient is currently on heparin  drip.  11/29/2024: Patient seen alongside patient's wife.  Patient is awake and alert.  Patient moves left upper and lower extremities.  Patient hits out from time to time, likely due to behavioral problems associated with dementing illness.  Likely discharge in the next 24 hours.  EEG is negative.  Acute kidney injury on chronic kidney disease stage 3a is noted (baseline serum creatinine of 1.48.  On presentation, serum creatinine was 2.1).  AKI may be secondary to volume depletion.  11/30/2024: Hematuria noted last night.  Heparin  has been on hold.  Will change heparin  to Eliquis .  Continue to monitor closely.  No further hematuria.  No new complaints.  12/01/2024: Patient seen.  Patient continues to improve slowly.  Likely discharge tomorrow.  Patient is still on heparin  drip.   Assessment & Plan:   Principal Problem:   Pulmonary embolism (HCC) Active Problems:   CAD (coronary artery disease)   Peripheral vascular disease (HCC)   Atrial fibrillation with normal ventricular rate (HCC)   History of hemorrhagic cerebrovascular accident (CVA) with residual deficit   Hypertension associated with diabetes (HCC)   Dyslipidemia associated with type 2  diabetes mellitus (HCC)   Type 2 diabetes mellitus with peripheral vascular disease (HCC)   Major depression with psychotic features (HCC)   RLS (restless legs syndrome)   Vascular dementia without behavioral disturbance (HCC)   GERD without esophagitis   Stage 3b chronic kidney disease (HCC)   Focal neurological deficit   Cerebrovascular accident (CVA) (HCC)   Focal neurologic deficits History of CVA > History of prior hemorrhagic CVA and prior infarct. > Baseline right-sided deficits. > At the facility, patient was found to have decreased left-sided function and was less interactive > Brought as a code stroke, seen by neurology.  CT head showed old infarct and chronic encephalomalacia from prior stroke. > CTA head neck did not show LVO, significant stenosis, aneurysm but did show evidence of PE; addressed below. - Neurology consulted -Workup for stroke so far is negative.  MRI brain is negative for acute stroke. - Continue home statin   - Follow echocardiogram  - Tele monitoring  - SLP eval (has baseline dysphagia issues prior to this event) - PT/OT -EEG was negative.   Acute PE History of DVT > History of DVT, previously on Eliquis .  Completed course has been off Eliquis  since 2023. > Incidental PE noted on CTA head and neck performed for workup of possible stroke as above. - Continue with heparin  - Echocardiogram - Will benefit from CTA PE study, however this has been delayed due to CKD and patient was exposed to to contrast dye earlier. 11/30/2024: See above documentation. 12/01/2024: Patient is still on heparin  drip.  Hypertension - Continue to optimize. - Keep blood pressure less than 130/80 mmHg.   -Last  blood pressure 130/70 mmHg.     Hyperlipidemia - Continue rosuvastatin    Diabetes > Continue subcutaneous Lantus  10 units daily, and sliding scale insulin  coverage (0 to 15 units).     Acute kidney injury on chronic kidney disease 3A versus progression to CKD  3B: > Baseline serum creatinine of 1.48. - Admitted with serum creatinine of 2.10. - Last serum creatinine was 1.90. - Avoid nephrotoxins. - Adequately hydrate patient. - Keep MAP greater than 65 mmHg. - Avoid nephrotoxins.   -Suspect secondary to volume depletion. 11/30/2024: Serum creatinine has improved to 1.68.  Continue to monitor renal function closely.  Phosphorus of 2.1.  Monitor and replace phosphorus. 12/01/2024: Repeat renal panel in the morning.   PAD - On heparin  for acute PE.  -Stable   CAD - On heparin  for acute PE. - Stable.  - Continue home rosuvastatin    A-fib - On heparin  for now   Depression Dementia RLS - Noted  Hypophosphatemia: - Replete. 12/01/2024: Repeat renal panel in the morning.   DVT prophylaxis: Subcutaneous heparin  Code Status: DO NOT RESUSCITATE Family Communication: Wife by bedside Disposition Plan: Likely discharge back to SNF   Consultants:  Neurology  Procedures:  EEG  Antimicrobials:  None   Subjective: No history from patient.  Objective: Vitals:   12/01/24 0326 12/01/24 0757 12/01/24 1110 12/01/24 1648  BP: 114/70 131/80 (!) 148/79 130/70  Pulse: 74 76 78   Resp: 19 20 20 20   Temp: 98.6 F (37 C) 98.4 F (36.9 C) 98.1 F (36.7 C) 98.4 F (36.9 C)  TempSrc: Axillary Oral Oral Oral  SpO2: 93% 95% 97% 92%  Weight:      Height:        Intake/Output Summary (Last 24 hours) at 12/01/2024 1955 Last data filed at 12/01/2024 1658 Gross per 24 hour  Intake 38.55 ml  Output 600 ml  Net -561.45 ml   Filed Weights   11/28/24 1100 11/28/24 1121 11/29/24 1533  Weight: 74.4 kg 74.4 kg 73.1 kg    Examination:  General exam: Appears calm and comfortable.  Mild discharge from left eye has improved.  Patient is more interactive today. Respiratory system: Clear to auscultation. Respiratory effort normal. Cardiovascular system: S1 & S2 heard Gastrointestinal system: Abdomen is soft and nontender. No organomegaly  or masses felt. Normal bowel sounds heard. Central nervous system: Awake and alert.  Patient is demented.  Right-sided hemiplegia (old).  Patient moves left upper and lower extremity.    Data Reviewed: I have personally reviewed following labs and imaging studies  CBC: Recent Labs  Lab 11/28/24 1106 11/28/24 1111 11/30/24 0438 12/01/24 0547  WBC  --  9.9 8.3 7.7  NEUTROABS  --  5.4  --   --   HGB 16.7 16.4 15.7 15.7  HCT 49.0 50.7 48.1 46.2  MCV  --  95.1 94.1 92.2  PLT  --  182 171 145*   Basic Metabolic Panel: Recent Labs  Lab 11/28/24 1106 11/28/24 1150 11/30/24 1151  NA 143 140 134*  K 5.3* 4.4 4.5  CL 106 106 98  CO2  --  28 29  GLUCOSE 153* 135* 296*  BUN 54* 41* 30*  CREATININE 2.10* 1.90* 1.68*  CALCIUM   --  9.4 9.2  PHOS  --   --  2.1*   GFR: Estimated Creatinine Clearance: 36.2 mL/min (A) (by C-G formula based on SCr of 1.68 mg/dL (H)). Liver Function Tests: Recent Labs  Lab 11/28/24 1150 11/30/24 1151  AST 23  --  ALT 15  --   ALKPHOS 96  --   BILITOT 0.6  --   PROT 5.9*  --   ALBUMIN  3.1* 3.2*   No results for input(s): LIPASE, AMYLASE in the last 168 hours. No results for input(s): AMMONIA in the last 168 hours. Coagulation Profile: Recent Labs  Lab 11/28/24 1111  INR 1.1   Cardiac Enzymes: No results for input(s): CKTOTAL, CKMB, CKMBINDEX, TROPONINI in the last 168 hours. BNP (last 3 results) No results for input(s): PROBNP in the last 8760 hours. HbA1C: No results for input(s): HGBA1C in the last 72 hours. CBG: Recent Labs  Lab 12/01/24 0015 12/01/24 0338 12/01/24 0757 12/01/24 1216 12/01/24 1650  GLUCAP 178* 184* 134* 237* 190*   Lipid Profile: No results for input(s): CHOL, HDL, LDLCALC, TRIG, CHOLHDL, LDLDIRECT in the last 72 hours. Thyroid  Function Tests: No results for input(s): TSH, T4TOTAL, FREET4, T3FREE, THYROIDAB in the last 72 hours. Anemia Panel: No results for input(s):  VITAMINB12, FOLATE, FERRITIN, TIBC, IRON, RETICCTPCT in the last 72 hours. Urine analysis:    Component Value Date/Time   COLORURINE AMBER (A) 11/30/2024 1619   APPEARANCEUR CLOUDY (A) 11/30/2024 1619   LABSPEC 1.020 11/30/2024 1619   PHURINE 6.5 11/30/2024 1619   GLUCOSEU >=500 (A) 11/30/2024 1619   HGBUR LARGE (A) 11/30/2024 1619   BILIRUBINUR NEGATIVE 11/30/2024 1619   KETONESUR NEGATIVE 11/30/2024 1619   PROTEINUR 100 (A) 11/30/2024 1619   UROBILINOGEN 1.0 08/23/2012 1732   NITRITE POSITIVE (A) 11/30/2024 1619   LEUKOCYTESUR MODERATE (A) 11/30/2024 1619   Sepsis Labs: @LABRCNTIP (procalcitonin:4,lacticidven:4)  ) Recent Results (from the past 240 hours)  MRSA Next Gen by PCR, Nasal     Status: Abnormal   Collection Time: 11/30/24  4:03 PM   Specimen: Nasal Mucosa; Nasal Swab  Result Value Ref Range Status   MRSA by PCR Next Gen DETECTED (A) NOT DETECTED Final    Comment: (NOTE) The GeneXpert MRSA Assay (FDA approved for NASAL specimens only), is one component of a comprehensive MRSA colonization surveillance program. It is not intended to diagnose MRSA infection nor to guide or monitor treatment for MRSA infections. Test performance is not FDA approved in patients less than 25 years old. Performed at [facility], [address] [place], [place] [address]          Radiology Studies: No results found.       Scheduled Meds:  Chlorhexidine  Gluconate Cloth  6 each Topical Daily   FLUoxetine   10 mg Oral Daily   insulin  aspart  0-15 Units Subcutaneous Q4H   insulin  glargine  10 Units Subcutaneous Daily   mupirocin  ointment  1 Application Nasal BID   rosuvastatin   10 mg Oral QHS   trimethoprim -polymyxin b   1 drop Left Eye Q4H   Continuous Infusions:  heparin  850 Units/hr (12/01/24 1446)      LOS: 3 days    Time spent: 35 minutes    Leatrice Chapel, MD  [facility] Pager #: [phone] 7PM-7AM contact night coverage as  above    "

## 2024-12-01 NOTE — Progress Notes (Addendum)
 Pt has been increasingly confused & agitated. Pt keeps taking his telemetry off & yelling for people to get out when they come in to help. This RN contacted the pt's provider whom gave a verbal order to d/c the telemetry. Pt's wife updated as well.

## 2024-12-01 NOTE — Progress Notes (Signed)
 PHARMACY - ANTICOAGULATION CONSULT NOTE  Pharmacy Consult for Heparin  Indication: pulmonary embolus  Allergies[1]  Patient Measurements: Height: 5' 10 (177.8 cm) Weight: 73.1 kg (161 lb 2.5 oz) IBW/kg (Calculated) : 73 HEPARIN  DW (KG): 73.1  Vital Signs: Temp: 98.6 F (37 C) (12/21 0326) Temp Source: Axillary (12/21 0326) BP: 114/70 (12/21 0326) Pulse Rate: 74 (12/21 0326)  Labs: Recent Labs    11/28/24 1106 11/28/24 1111 11/28/24 1150 11/28/24 2129 11/29/24 1853 11/30/24 0438 11/30/24 1151 12/01/24 0547  HGB 16.7 16.4  --   --   --  15.7  --  15.7  HCT 49.0 50.7  --   --   --  48.1  --  46.2  PLT  --  182  --   --   --  171  --  145*  APTT  --  30  --   --   --   --   --   --   LABPROT  --  14.8  --   --   --   --   --   --   INR  --  1.1  --   --   --   --   --   --   HEPARINUNFRC  --   --   --    < > 0.20* 0.32  --  0.23*  CREATININE 2.10*  --  1.90*  --   --   --  1.68*  --    < > = values in this interval not displayed.    Estimated Creatinine Clearance: 36.2 mL/min (A) (by C-G formula based on SCr of 1.68 mg/dL (H)).  Medications:  See home med rec  Assessment: 78 y.o. M presents with stroke. Noted pt with h/o ICH. CTA shows nonobstructive thromboembolic disease along the posterior wall of the left.pulmonary artery. Neurology okay with starting heparin  - low goal, no bolus. No AC PTA. CBC ok on admission.  Patient switched from heparin  gtt to apixaban  12/20-did not receive any apixaban  given refusing PO meds.   Heparin  level is subtherapeutic at 0.23. RN reports still ongoing hematuria. Will continue to monitor and CBC stable (Hgb 15.7, plt 145). Will increase slightly to get to goal.    Goal of Therapy:  Heparin  level 0.3-0.5 units/ml Monitor platelets by anticoagulation protocol: Yes   Plan:  Increase heparin  to 850 units/hr Monitor for signs of continued hematuria Monitor daily heparin  level, CBC, and for s/sx of bleeding   Thank you for  allowing pharmacy to participate in this patient's care,  Bryann Gentz, PharmD PGY1 Clinical Pharmacist [facility]  12/01/2024 7:22 AM          [1] No Known Allergies

## 2024-12-02 ENCOUNTER — Inpatient Hospital Stay (HOSPITAL_COMMUNITY)

## 2024-12-02 ENCOUNTER — Other Ambulatory Visit (HOSPITAL_COMMUNITY): Payer: Self-pay

## 2024-12-02 DIAGNOSIS — R911 Solitary pulmonary nodule: Secondary | ICD-10-CM

## 2024-12-02 DIAGNOSIS — I2699 Other pulmonary embolism without acute cor pulmonale: Secondary | ICD-10-CM | POA: Diagnosis not present

## 2024-12-02 DIAGNOSIS — I4891 Unspecified atrial fibrillation: Secondary | ICD-10-CM | POA: Diagnosis not present

## 2024-12-02 LAB — RENAL FUNCTION PANEL
Albumin: 3.3 g/dL — ABNORMAL LOW (ref 3.5–5.0)
Anion gap: 8 (ref 5–15)
BUN: 24 mg/dL — ABNORMAL HIGH (ref 8–23)
CO2: 28 mmol/L (ref 22–32)
Calcium: 9.5 mg/dL (ref 8.9–10.3)
Chloride: 103 mmol/L (ref 98–111)
Creatinine, Ser: 1.49 mg/dL — ABNORMAL HIGH (ref 0.61–1.24)
GFR, Estimated: 47 mL/min — ABNORMAL LOW
Glucose, Bld: 136 mg/dL — ABNORMAL HIGH (ref 70–99)
Phosphorus: 2.8 mg/dL (ref 2.5–4.6)
Potassium: 4.2 mmol/L (ref 3.5–5.1)
Sodium: 139 mmol/L (ref 135–145)

## 2024-12-02 LAB — GLUCOSE, CAPILLARY
Glucose-Capillary: 139 mg/dL — ABNORMAL HIGH (ref 70–99)
Glucose-Capillary: 142 mg/dL — ABNORMAL HIGH (ref 70–99)
Glucose-Capillary: 165 mg/dL — ABNORMAL HIGH (ref 70–99)
Glucose-Capillary: 96 mg/dL (ref 70–99)

## 2024-12-02 LAB — HEPARIN LEVEL (UNFRACTIONATED): Heparin Unfractionated: 0.41 [IU]/mL (ref 0.30–0.70)

## 2024-12-02 LAB — CBC
HCT: 47 % (ref 39.0–52.0)
Hemoglobin: 15.7 g/dL (ref 13.0–17.0)
MCH: 31 pg (ref 26.0–34.0)
MCHC: 33.4 g/dL (ref 30.0–36.0)
MCV: 92.7 fL (ref 80.0–100.0)
Platelets: 146 K/uL — ABNORMAL LOW (ref 150–400)
RBC: 5.07 MIL/uL (ref 4.22–5.81)
RDW: 12.4 % (ref 11.5–15.5)
WBC: 8.2 K/uL (ref 4.0–10.5)
nRBC: 0 % (ref 0.0–0.2)

## 2024-12-02 MED ORDER — APIXABAN 5 MG PO TABS
5.0000 mg | ORAL_TABLET | Freq: Two times a day (BID) | ORAL | 2 refills | Status: AC
  Filled 2024-12-02 (×2): qty 60, 30d supply, fill #0

## 2024-12-02 MED ORDER — APIXABAN (ELIQUIS) VTE STARTER PACK (10MG AND 5MG)
ORAL_TABLET | ORAL | 0 refills | Status: DC
  Filled 2024-12-02: qty 74, 30d supply, fill #0

## 2024-12-02 MED ORDER — APIXABAN 5 MG PO TABS
5.0000 mg | ORAL_TABLET | Freq: Two times a day (BID) | ORAL | Status: DC
  Administered 2024-12-02: 5 mg via ORAL
  Filled 2024-12-02: qty 1

## 2024-12-02 MED ORDER — IOHEXOL 350 MG/ML SOLN
75.0000 mL | Freq: Once | INTRAVENOUS | Status: AC | PRN
  Administered 2024-12-02: 75 mL via INTRAVENOUS

## 2024-12-02 MED ORDER — INSULIN ASPART 100 UNIT/ML IJ SOLN
0.0000 [IU] | Freq: Three times a day (TID) | INTRAMUSCULAR | Status: DC
  Administered 2024-12-02: 2 [IU] via SUBCUTANEOUS
  Administered 2024-12-02: 3 [IU] via SUBCUTANEOUS
  Filled 2024-12-02 (×2): qty 7

## 2024-12-02 NOTE — Progress Notes (Signed)
 Discharge Lounge:  TOC meds delivered to [facility] primary nurse.  At 1633

## 2024-12-02 NOTE — Progress Notes (Signed)
 Discharge to [facility] with [facility].  Patient with no active complaints at this time.  Family made aware patient is transferring and will meet him at facility.

## 2024-12-02 NOTE — Plan of Care (Signed)
 " Problem: Education: Goal: Knowledge of disease or condition will improve Outcome: Adequate for Discharge Goal: Knowledge of secondary prevention will improve (MUST DOCUMENT ALL) Outcome: Adequate for Discharge Goal: Knowledge of patient specific risk factors will improve (DELETE if not current risk factor) Outcome: Adequate for Discharge   Problem: Ischemic Stroke/TIA Tissue Perfusion: Goal: Complications of ischemic stroke/TIA will be minimized Outcome: Adequate for Discharge   Problem: Coping: Goal: Will verbalize positive feelings about self Outcome: Adequate for Discharge Goal: Will identify appropriate support needs Outcome: Adequate for Discharge   Problem: Health Behavior/Discharge Planning: Goal: Ability to manage health-related needs will improve Outcome: Adequate for Discharge Goal: Goals will be collaboratively established with patient/family Outcome: Adequate for Discharge   Problem: Self-Care: Goal: Ability to participate in self-care as condition permits will improve Outcome: Adequate for Discharge Goal: Verbalization of feelings and concerns over difficulty with self-care will improve Outcome: Adequate for Discharge Goal: Ability to communicate needs accurately will improve Outcome: Adequate for Discharge   Problem: Nutrition: Goal: Risk of aspiration will decrease Outcome: Adequate for Discharge Goal: Dietary intake will improve Outcome: Adequate for Discharge   Problem: Education: Goal: Ability to describe self-care measures that may prevent or decrease complications (Diabetes Survival Skills Education) will improve Outcome: Adequate for Discharge Goal: Individualized Educational Video(s) Outcome: Adequate for Discharge   Problem: Coping: Goal: Ability to adjust to condition or change in health will improve Outcome: Adequate for Discharge   Problem: Fluid Volume: Goal: Ability to maintain a balanced intake and output will improve Outcome: Adequate  for Discharge   Problem: Health Behavior/Discharge Planning: Goal: Ability to identify and utilize available resources and services will improve Outcome: Adequate for Discharge Goal: Ability to manage health-related needs will improve Outcome: Adequate for Discharge   Problem: Metabolic: Goal: Ability to maintain appropriate glucose levels will improve Outcome: Adequate for Discharge   Problem: Nutritional: Goal: Maintenance of adequate nutrition will improve Outcome: Adequate for Discharge Goal: Progress toward achieving an optimal weight will improve Outcome: Adequate for Discharge   Problem: Skin Integrity: Goal: Risk for impaired skin integrity will decrease Outcome: Adequate for Discharge   Problem: Tissue Perfusion: Goal: Adequacy of tissue perfusion will improve Outcome: Adequate for Discharge   Problem: SLP Dysphagia Goals Goal: Patient will utilize recommended strategies Description: Patient will utilize recommended strategies during swallow to increase swallowing safety with Outcome: Adequate for Discharge   Problem: Education: Goal: Knowledge of General Education information will improve Description: Including pain rating scale, medication(s)/side effects and non-pharmacologic comfort measures Outcome: Adequate for Discharge   Problem: Health Behavior/Discharge Planning: Goal: Ability to manage health-related needs will improve Outcome: Adequate for Discharge   Problem: Clinical Measurements: Goal: Ability to maintain clinical measurements within normal limits will improve Outcome: Adequate for Discharge Goal: Will remain free from infection Outcome: Adequate for Discharge Goal: Diagnostic test results will improve Outcome: Adequate for Discharge Goal: Respiratory complications will improve Outcome: Adequate for Discharge Goal: Cardiovascular complication will be avoided Outcome: Adequate for Discharge   Problem: Activity: Goal: Risk for activity  intolerance will decrease Outcome: Adequate for Discharge   Problem: Nutrition: Goal: Adequate nutrition will be maintained Outcome: Adequate for Discharge   Problem: Coping: Goal: Level of anxiety will decrease Outcome: Adequate for Discharge   Problem: Elimination: Goal: Will not experience complications related to bowel motility Outcome: Adequate for Discharge Goal: Will not experience complications related to urinary retention Outcome: Adequate for Discharge   Problem: Pain Managment: Goal: General experience of comfort will improve and/or be  controlled Outcome: Adequate for Discharge   Problem: Safety: Goal: Ability to remain free from injury will improve Outcome: Adequate for Discharge   Problem: Skin Integrity: Goal: Risk for impaired skin integrity will decrease Outcome: Adequate for Discharge   Problem: Acute Rehab PT Goals(only PT should resolve) Goal: Pt will Roll Supine to Side Outcome: Adequate for Discharge Goal: Pt Will Go Supine/Side To Sit Outcome: Adequate for Discharge Goal: Pt Will Go Sit To Supine/Side Outcome: Adequate for Discharge Goal: Patient Will Perform Sitting Balance Outcome: Adequate for Discharge   "

## 2024-12-02 NOTE — Progress Notes (Addendum)
 PHARMACY - ANTICOAGULATION CONSULT NOTE  Pharmacy Consult for Heparin  Indication: pulmonary embolus  Allergies[1]  Patient Measurements: Height: 5' 10 (177.8 cm) Weight: 73.1 kg (161 lb 2.5 oz) IBW/kg (Calculated) : 73 HEPARIN  DW (KG): 73.1  Vital Signs: Temp: 97.9 F (36.6 C) (12/22 0500) Temp Source: Axillary (12/22 0500) BP: 148/83 (12/21 2315) Pulse Rate: 80 (12/22 0500)  Labs: Recent Labs    11/30/24 0438 11/30/24 1151 12/01/24 0547 12/01/24 2237 12/02/24 0543  HGB 15.7  --  15.7  --  15.7  HCT 48.1  --  46.2  --  47.0  PLT 171  --  145*  --  146*  HEPARINUNFRC 0.32  --  0.23* 0.36 0.41  CREATININE  --  1.68*  --   --  1.49*    Estimated Creatinine Clearance: 40.8 mL/min (A) (by C-G formula based on SCr of 1.49 mg/dL (H)).  Medications:  See home med rec  Assessment: 78 y.o. M presents with stroke. Noted pt with h/o ICH. CTA shows nonobstructive thromboembolic disease along the posterior wall of the left.pulmonary artery. Neurology okay with starting heparin  - low goal, no bolus. No AC PTA. CBC ok on admission.  Heparin  level 0.41 is therapeutic with heparin  running at 850 units/hr. Hgb (15.7) and PLTs (146) are stable. Per RN, no report of pauses, issues with the line, or signs of bleeding. No hematuria noted this morning.   Goal of Therapy:  Heparin  level 0.3-0.5 units/ml Monitor platelets by anticoagulation protocol: Yes   Plan:  Continue heparin  at 850 units/hr Monitor for signs of continued hematuria  Monitor daily heparin  level, CBC, and for s/sx of bleeding  F/u plans for transition to oral anticoagulation  Thank you for allowing pharmacy to be a part of this patients care.   Nidia Schaffer, PharmD PGY2 Cardiology Pharmacy Resident  Please check AMION for all [facility] Pharmacy phone numbers After 10:00 PM, call Main Pharmacy [phone] 12/02/2024 7:52 AM     [1] No Known Allergies

## 2024-12-02 NOTE — Progress Notes (Signed)
 Attempted telephonic report to receiving facility, [facility] at [phone].  Used option 9 for reception and directed to voicemail with no confidentiality message.  HIPAA compliant voicemail message left with this RN's name and callback number.  Requested return call for report to facility.  Will attempt again at a later time, if able.

## 2024-12-02 NOTE — TOC Transition Note (Signed)
 Transition of Care [facility]) - Discharge Note   Patient Details  Name: [name] MRN: [id number] Date of Birth: 1946/04/18  Transition of [facility]) CM/SW Contact:  Luann SHAUNNA Cumming, LCSW Phone Number: 12/02/2024, 2:48 PM   Clinical Narrative:     Per MD patient ready for DC to [facility]. RN, patient, patient's family, and facility notified of DC. Discharge Summary and FL2 sent to facility. RN to call report prior to discharge [phone]). DC packet on chart. Ambulance transport requested for patient.   CSW will sign off for now as social work intervention is no longer needed. Please consult us  again if new needs arise.   Final next level of care: Skilled Nursing Facility Barriers to Discharge: No Barriers Identified   Patient Goals and CMS Choice            Discharge Placement              Patient chooses bed at: [facility] Patient to be transferred to facility by: [facility]   Patient and family notified of of transfer: 12/02/24  Discharge Plan and Services Additional resources added to the After Visit Summary for                                       Social Drivers of Health (SDOH) Interventions SDOH Screenings   Food Insecurity: No Food Insecurity (11/29/2024)  Housing: Low Risk (11/29/2024)  Transportation Needs: No Transportation Needs (11/29/2024)  Utilities: Not At Risk (11/29/2024)  Depression (PHQ2-9): Low Risk (09/29/2023)  Social Connections: Moderately Integrated (11/29/2024)  Tobacco Use: Medium Risk (11/29/2024)     Readmission Risk Interventions     No data to display

## 2024-12-02 NOTE — [place] FL2 (Signed)
 " [place]  MEDICAID FL2 LEVEL OF CARE FORM     IDENTIFICATION  Patient Name: [name] Birthdate: [birth year] Sex: male Admission Date (Current Location): 11/28/2024  [facility] and [place] Number:  Producer, Television/film/video and Address:  [facility], [address] [place], [place] [address]      Provider Number: [id number]  Attending Physician Name and Address:  Mcarthur Pick, MD  Relative Name and Phone Number:       Current Level of Care: Hospital Recommended Level of Care: Skilled Nursing Facility Prior Approval Number:    Date Approved/Denied:   PASRR Number: [id number]  Discharge Plan: ICF    Current Diagnoses: Patient Active Problem List   Diagnosis Date Noted   Cerebrovascular accident (CVA) (HCC) 11/29/2024   Focal neurological deficit 11/28/2024   Pulmonary embolism (HCC) 11/28/2024   Adult failure to thrive syndrome 02/28/2024   Somnolence 02/26/2024   Chronic constipation 08/22/2023   Chronic generalized pain 05/30/2023   Stage 3b chronic kidney disease (HCC) 03/28/2023   GERD without esophagitis 02/22/2023   Vascular dementia without behavioral disturbance (HCC) 12/16/2022   Osteopenia 07/29/2022   History of DVT (deep vein thrombosis) 04/28/2022   RLS (restless legs syndrome) 07/13/2021   Bladder spasms 02/12/2021   Aortic atherosclerosis 12/17/2020   Expressive aphasia 11/18/2020   Right spastic hemiplegia (HCC) 05/27/2020   Microalbuminuria due to type 2 diabetes mellitus (HCC) 06/04/2019   Hypertension associated with diabetes (HCC) 01/28/2019   Dyslipidemia associated with type 2 diabetes mellitus (HCC) 01/28/2019   Type 2 diabetes mellitus with peripheral vascular disease (HCC) 01/28/2019   Major depression with psychotic features (HCC) 01/28/2019   Protein-calorie malnutrition, severe 01/08/2019   History of hemorrhagic cerebrovascular accident (CVA) with residual deficit 01/04/2019   Urinary retention 12/24/2018    Nontraumatic subcortical hemorrhage of left cerebral hemisphere (HCC) 11/18/2018   CAD (coronary artery disease)    Peripheral vascular disease (HCC)    Atrial fibrillation with normal ventricular rate (HCC)    Essential hypertension 08/15/2012   Mixed dyslipidemia 08/15/2012    Orientation RESPIRATION BLADDER Height & Weight     Self  Normal Continent Weight: 161 lb 2.5 oz (73.1 kg) Height:  5' 10 (177.8 cm)  BEHAVIORAL SYMPTOMS/MOOD NEUROLOGICAL BOWEL NUTRITION STATUS      Incontinent Diet (see d/c summary)  AMBULATORY STATUS COMMUNICATION OF NEEDS Skin   Extensive Assist Verbally PU Stage and Appropriate Care (pressure injury coccyx, wound left foot anterior)                       Personal Care Assistance Level of Assistance  Bathing, Feeding, Dressing Bathing Assistance: Maximum assistance Feeding assistance: Independent Dressing Assistance: Maximum assistance     Functional Limitations Info  Sight, Hearing, Speech Sight Info: Impaired Hearing Info: Adequate Speech Info: Impaired    SPECIAL CARE FACTORS FREQUENCY                       Contractures Contractures Info: Not present    Additional Factors Info  Code Status, Allergies Code Status Info: DNR Allergies Info: no known allergies           Current Medications (12/02/2024):  This is the current hospital active medication list Current Facility-Administered Medications  Medication Dose Route Frequency Provider Last Rate Last Admin   acetaminophen  (TYLENOL ) tablet 650 mg  650 mg Oral Q4H PRN Melvin, Alexander B, MD   650 mg at  11/30/24 0944   Or   acetaminophen  (TYLENOL ) 160 MG/5ML solution 650 mg  650 mg Per Tube Q4H PRN Melvin, Alexander B, MD       Or   acetaminophen  (TYLENOL ) suppository 650 mg  650 mg Rectal Q4H PRN Seena Marsa NOVAK, MD       Chlorhexidine  Gluconate Cloth 2 % PADS 6 each  6 each Topical Daily Rosario Eland I, MD   6 each at 12/01/24 0818   FLUoxetine  (PROZAC )  capsule 10 mg  10 mg Oral Daily Melvin, Alexander B, MD   10 mg at 12/02/24 0831   heparin  ADULT infusion 100 units/mL (25000 units/250mL)  850 Units/hr Intravenous Continuous Alveria Modest, RPH 8.5 mL/hr at 12/02/24 0630 850 Units/hr at 12/02/24 0630   insulin  aspart (novoLOG ) injection 0-15 Units  0-15 Units Subcutaneous TID WC Sigdel, Santosh, MD   2 Units at 12/02/24 0831   insulin  glargine (LANTUS ) injection 10 Units  10 Units Subcutaneous Daily Melvin, Alexander B, MD   10 Units at 12/02/24 0831   mupirocin  ointment (BACTROBAN ) 2 % 1 Application  1 Application Nasal BID Rosario Eland I, MD   1 Application at 12/02/24 0831   rosuvastatin  (CRESTOR ) tablet 10 mg  10 mg Oral QHS Melvin, Alexander B, MD   10 mg at 12/01/24 2029   senna-docusate (Senokot-S) tablet 1 tablet  1 tablet Oral QHS PRN Melvin, Alexander B, MD       trimethoprim -polymyxin b  (POLYTRIM ) ophthalmic solution 1 drop  1 drop Left Eye Q4H Rosario Eland I, MD   1 drop at 12/02/24 9168     Discharge Medications: Please see discharge summary for a list of discharge medications.  Relevant Imaging Results:  Relevant Lab Results:   Additional Information SSN [id number] [place], LCSW     "

## 2024-12-02 NOTE — Progress Notes (Signed)
 Speech Language Pathology Treatment: Dysphagia  Patient Details Name: [name] MRN: [id number] DOB: Jun 09, 1946 Today's Date: 12/02/2024 Time: 1005-1016 SLP Time Calculation (min) (ACUTE ONLY): 11 min  Assessment / Plan / Recommendation Clinical Impression  Pt seen for brief dysphagia tx/education session with wife present and pt much more alert/responsive.  Pt noted When am I leaving to go home? When SLP entered room.  Pt consumed thin via Provale cup with min verbal cues provided during intake without overt s/s of aspiration present throughout session.  Swallow initiation improved and min oral retention/holding observed during all intake.  Wife noted min intake with current diet order and pt only agreeable to consuming puree consistency this session.  Recommend continue current diet with swallow precautions including use of provale cup with all liquids with FULL A and supervision provided during po intake.  Wife informed pt was schedule for OP MBS on 12/28 per chart review and she was in agreement with this plan vs completing within hospital during this stay.  ST will s/o in acute setting as pt is consuming current diet with provale cup without overt s/s of aspiration and improved mentation.    HPI HPI: [name] is a 78 y.o. male with medical history significant of hypertension, hyperlipidemia, diabetes, CKD 3B, PAD, GERD, CVA, CAD, A-fib, DVT, dementia, depression, RLS presenting as a code stroke from [facility] nursing center due to decreased verbal responsiveness and left-sided weakness. Patient resides at [facility] nursing center. Was seen today after he noted to have some left-sided weakness and decreased verbal responsiveness. Was not eating breakfast either. Has some baseline right-sided deficits from prior stroke; Recommended Dysphagia 3/thin via provale cup.  ST f/u for diet tolerance/dysphagia tx.      SLP Plan  Discharge SLP treatment due to (comment) (goals achieved; MBS planned as an  OP)        Swallow Evaluation Recommendations   Recommendations: PO diet PO Diet Recommendation: Dysphagia 3 (Mechanical soft) Liquid Administration via:  (thin liquids via Provale cup) Medication Administration: Whole meds with liquid (via Provale cup or whole in puree) Supervision: Full assist for feeding;Staff to assist with self-feeding;Full supervision/cueing for swallowing strategies Postural changes: Position pt fully upright for meals;Stay upright 30-60 min after meals Oral care recommendations: Oral care BID (2x/day);Staff/trained caregiver to provide oral care     Recommendations   PO diet (Dysphagia 3/thin)                   Oral care BID;Staff/trained caregiver to provide oral care   Frequent or constant Supervision/Assistance Dysphagia, oropharyngeal phase (R13.12)     Discharge SLP treatment due to (comment) (goals achieved; MBS planned as an OP)     Pat Karilyn Wind,M.S., CCC-SLP  12/02/2024, 10:34 AM

## 2024-12-02 NOTE — Discharge Summary (Signed)
 Physician Discharge Summary  [name] FMW:[id number] DOB: 1946/01/13 DOA: 11/28/2024  PCP: Landy Barnie RAMAN, NP  Admit date: 11/28/2024 Discharge date: 12/02/2024  Admitted From: LTC Disposition: LTC  Recommendations for Outpatient Follow-up:  Follow up with PCP in 1 week  Follow up in ED if symptoms worsen or new appear Continue anticoagulation for PE, A-fib Follow-up right middle lobe pulmonary nodule 13 mm.  radiology recommends CT in 3 months, PET/CT or tissue sampling   Home Health: No Equipment/Devices: None  Discharge Condition: Stable CODE STATUS: DNR Diet recommendation: Heart healthy  Brief/Interim Summary:  Patient is an 78 year old male past medical history significant for hypertension, hyperlipidemia, diabetes, CKD 3B, PAD, GERD, CVA, CAD, A-fib, DVT, dementia, depression and RLS.  Patient was admitted with concerns for repeat CVA with left-sided weakness and decreased verbal responsiveness, however, workup has been negative for stroke.  Patient is awake and alert, and moves left upper and lower extremity.  Presentation is concerning for likely acute toxic metabolic encephalopathy that is resolving.  EEG was negative.  MRI brain, CTA head and neck negative except for incidental PE.   Patient is initiated on heparin  drip.  Some gross hematuria which is resolved.  Dedicated CTA PE shows chronic PE Will initiate Eliquis  5 mg twice daily. CT also shows right middle lobe pulmonary nodule that needs to be followed up in 3 months with CT  Discharge Diagnoses:  Principal Problem:   Pulmonary embolism (HCC) Active Problems:   CAD (coronary artery disease)   Peripheral vascular disease (HCC)   Atrial fibrillation with normal ventricular rate (HCC)   History of hemorrhagic cerebrovascular accident (CVA) with residual deficit   Hypertension associated with diabetes (HCC)   Dyslipidemia associated with type 2 diabetes mellitus (HCC)   Type 2 diabetes mellitus with  peripheral vascular disease (HCC)   Major depression with psychotic features (HCC)   RLS (restless legs syndrome)   Vascular dementia without behavioral disturbance (HCC)   GERD without esophagitis   Stage 3b chronic kidney disease (HCC)   Focal neurological deficit   Cerebrovascular accident (CVA) (HCC)   Pulmonary nodule 1 cm or greater in diameter   Focal neurologic deficits History of CVA > History of prior hemorrhagic CVA and prior infarct. > Baseline right-sided deficits. > At the facility, patient was found to have decreased left-sided function and was less interactive > Brought as a code stroke, seen by neurology.  CT head showed old infarct and chronic encephalomalacia from prior stroke. > CTA head neck did not show LVO, significant stenosis, aneurysm but did show evidence of PE; addressed below. - Neurology consulted -Workup for stroke so far is negative.  MRI brain is negative for acute stroke. -- SLP eval (has baseline dysphagia issues prior to this event) - PT/OT -EEG was negative. - Continue statin.  Initiate Eliquis    Acute PE History of DVT > History of DVT, previously on Eliquis .  Completed course has been off Eliquis  since 2023. > Incidental PE noted on CTA head and neck performed for workup of possible stroke as above. - CTA PE protocol shows well adherent filling defect on the distal left main pulmonary artery consistent with chronic PE. - Eliquis  on discharge  Solitary pulmonary nodule: Follow-up right middle lobe pulmonary nodule 13 mm.  radiology recommends CT in 3 months, PET/CT or tissue sampling  Hypertension - Continue to optimize. - Keep blood pressure less than 130/80 mmHg.   -Last blood pressure 130/70 mmHg.     Hyperlipidemia -  Continue rosuvastatin    Diabetes > Continue subcutaneous Lantus  10 units daily, and sliding scale insulin  coverage (0 to 15 units).     Acute kidney injury on chronic kidney disease 3A versus progression to CKD  3B: > Baseline serum creatinine of 1.48. - Admitted with serum creatinine of 2.10. - Improving to baseline   PAD CAD A-fib - On heparin  for now, Eliquis  on discharge   depression Dementia RLS - Noted   Hypophosphatemia: - Replete.   Code Status: DO NOT RESUSCITATE Family Communication: Wife by bedside Disposition Plan: discharge back to SNF     Consultants:  Neurology   Procedures:  EEG   Discharge Instructions  Discharge Instructions     Diet - low sodium heart healthy   Complete by: As directed    Discharge instructions   Complete by: As directed    1.  Start anticoagulation with Eliquis  for both PE and atrial fibrillation. 2.  Follow-up with PCP in 1 week.   Increase activity slowly   Complete by: As directed    Leave dressing on - Keep it clean, dry, and intact until clinic visit   Complete by: As directed       Allergies as of 12/02/2024   No Known Allergies      Medication List     TAKE these medications    acetaminophen  325 MG tablet Commonly known as: TYLENOL  Take 650 mg by mouth 2 (two) times daily.   amLODipine  2.5 MG tablet Commonly known as: NORVASC  Take 2.5 mg by mouth daily.   apixaban  5 MG Tabs tablet Commonly known as: ELIQUIS  Take 1 tablet (5 mg total) by mouth 2 (two) times daily.   Baclofen 5 MG Tabs Take 5 mg by mouth 4 (four) times daily.   clotrimazole 1 % cream Commonly known as: LOTRIMIN Apply 1 Application topically See admin instructions. Apply every shift as needed to head of penis   ENSURE ENLIVE PO Take 1 Can by mouth 2 (two) times daily.   FLUoxetine  10 MG capsule Commonly known as: PROZAC  Take 10 mg by mouth daily.   FreeStyle Libre 3 Sensor Misc 1 Device by Does not apply route every 14 (fourteen) days. Place 1 sensor on the skin every 14 days. Use to check glucose continuously   HumaLOG  100 UNIT/ML cartridge Generic drug: insulin  lispro Inject 5 Units into the skin See admin instructions. Inject 5  units into the skin with meals.   insulin  glargine 100 UNIT/ML injection Commonly known as: LANTUS  Inject 24 Units into the skin daily.   loratadine 10 MG tablet Commonly known as: CLARITIN Take 10 mg by mouth daily as needed for allergies.   losartan 25 MG tablet Commonly known as: COZAAR Take 25 mg by mouth daily.   OcuSoft Lid Scrub Original Pads Apply 1 Application topically 2 (two) times daily as needed (to clean eye.).   rosuvastatin  10 MG tablet Commonly known as: CRESTOR  Take 10 mg by mouth at bedtime.   sennosides-docusate sodium  8.6-50 MG tablet Commonly known as: SENOKOT-S Take 1 tablet by mouth in the morning and at bedtime.   Venelex Oint Apply 1 Application topically See admin instructions. Apply topicallly every shift to bilateral buttocks and sacrum               Discharge Care Instructions  (From admission, onward)           Start     Ordered   12/02/24 0000  Leave dressing on - Keep  it clean, dry, and intact until clinic visit        12/02/24 1420            Follow-up Information     Landy Barnie RAMAN, NP Follow up in 1 week(s).   Specialty: Geriatric Medicine Contact information: [address] [place] [address] [phone]                Allergies[1]  Consultations:    Procedures/Studies: CT Angio Chest Pulmonary Embolism (PE) W or WO Contrast Result Date: 12/02/2024 EXAM: CTA CHEST 12/02/2024 01:35:54 PM TECHNIQUE: CTA of the chest was performed without and with the administration of 75 mL of iohexol  (OMNIPAQUE ) 350 MG/ML injection. Multiplanar reformatted images are provided for review. MIP images are provided for review. Automated exposure control, iterative reconstruction, and/or weight based adjustment of the mA/kV was utilized to reduce the radiation dose to as low as reasonably achievable. COMPARISON: CT of the neck. CLINICAL HISTORY: Incidental finding of pulmonary embolism on CT of the neck. FINDINGS:  PULMONARY ARTERIES: Pulmonary arteries are adequately opacified for evaluation. There is a wall adherent filling defect within the posterior aspect of the distal left pulmonary artery distally. This corresponds to finding on comparison CT of the neck. These findings are suggestive of chronic pulmonary embolism. There are no clear distal embolic defects within the left upper lobe or left lower lobe. No embolic embolization identified within the right pulmonary arteries. No acute pulmonary embolus. Main pulmonary artery is normal in caliber. MEDIASTINUM: The heart and pericardium demonstrate no acute abnormality. There is no acute abnormality of the thoracic aorta. LYMPH NODES: No mediastinal, hilar or axillary lymphadenopathy. LUNGS AND PLEURA: There is atelectasis in the left lower lobe associated with elevation of the left hemidiaphragm. There is small loculation in the left pleural effusion posteriorly. There is a large nodule within the right middle lobe measuring 13 mm on image 69 of series 6. No focal consolidation or pulmonary edema. No pneumothorax. UPPER ABDOMEN: Limited images of the upper abdomen are unremarkable. SOFT TISSUES AND BONES: No acute bone or soft tissue abnormality. IMPRESSION: 1. Wall-adherent filling defect within the distal left main pulmonary artery, suggestive of chronic pulmonary embolism, with no acute pulmonary embolism. 2. Atelectasis and small loculated pleural effusion at the left lung base associated with chronic elevation of the left hemidiaphragm. 3. 13 mm right middle lobe pulmonary nodule, with consideration of non-contrast chest CT at 3 months, PET/CT, or tissue sampling as per Fleischner Society Guidelines. Electronically signed by: Norleen Boxer MD 12/02/2024 02:22 PM EST RP Workstation: [id number]   ECHOCARDIOGRAM COMPLETE Result Date: 11/29/2024    ECHOCARDIOGRAM REPORT   Patient Name:   TERRICK ALLRED Date of Exam: 11/29/2024 Medical Rec #:  [id number]      Height:        68.5 in Accession #:    [id number]     Weight:       164.0 lb Date of Birth:  1946-08-05       BSA:          1.889 m Patient Age:    78 years       BP:           129/74 mmHg Patient Gender: M              HR:           71 bpm. Exam Location:  Inpatient Procedure: 2D Echo, Cardiac Doppler, Color Doppler and Intracardiac  Opacification Agent (Both Spectral and Color Flow Doppler were            utilized during procedure). Indications:    Pulmonary Embolus  History:        Patient has no prior history of Echocardiogram examinations.                 CAD; Risk Factors:Hypertension and Diabetes. CKD.  Sonographer:    Philomena Daring Referring Phys: MARSA KATHEE SCURRY  Sonographer Comments: Image acquisition challenging due to uncooperative patient. IMPRESSIONS  1. Left ventricular ejection fraction, by estimation, is 55 to 60%. The left ventricle has normal function. The left ventricle has no regional wall motion abnormalities. There is mild left ventricular hypertrophy. Left ventricular diastolic parameters are consistent with Grade I diastolic dysfunction (impaired relaxation).  2. Right ventricular systolic function was not well visualized. The right ventricular size is not well visualized.  3. The mitral valve is normal in structure. Trivial mitral valve regurgitation. No evidence of mitral stenosis.  4. The aortic valve was not well visualized. Aortic valve regurgitation is trivial. No aortic stenosis is present. FINDINGS  Left Ventricle: Left ventricular ejection fraction, by estimation, is 55 to 60%. The left ventricle has normal function. The left ventricle has no regional wall motion abnormalities. Definity  contrast agent was given IV to delineate the left ventricular  endocardial borders. The left ventricular internal cavity size was small. There is mild left ventricular hypertrophy. Left ventricular diastolic parameters are consistent with Grade I diastolic dysfunction (impaired relaxation). Right  Ventricle: The right ventricular size is not well visualized. Right vetricular wall thickness was not well visualized. Right ventricular systolic function was not well visualized. Left Atrium: Left atrial size was normal in size. Right Atrium: Right atrial size was normal in size. Pericardium: There is no evidence of pericardial effusion. Mitral Valve: The mitral valve is normal in structure. Trivial mitral valve regurgitation. No evidence of mitral valve stenosis. Tricuspid Valve: The tricuspid valve is normal in structure. Tricuspid valve regurgitation is trivial. Aortic Valve: The aortic valve was not well visualized. Aortic valve regurgitation is trivial. No aortic stenosis is present. Pulmonic Valve: The pulmonic valve was not well visualized. Pulmonic valve regurgitation is not visualized. Aorta: The aortic root is normal in size and structure. IAS/Shunts: The interatrial septum was not well visualized.  LEFT VENTRICLE PLAX 2D LVIDd:         3.80 cm   Diastology LVIDs:         2.70 cm   LV e' medial:    3.59 cm/s LV PW:         1.10 cm   LV E/e' medial:  12.6 LV IVS:        1.20 cm   LV e' lateral:   7.07 cm/s LVOT diam:     2.00 cm   LV E/e' lateral: 6.4 LV SV:         58 LV SV Index:   31 LVOT Area:     3.14 cm  RIGHT VENTRICLE RV S prime:     8.16 cm/s TAPSE (M-mode): 1.1 cm LEFT ATRIUM             Index        RIGHT ATRIUM           Index LA diam:        3.60 cm 1.91 cm/m   RA Area:     13.80 cm LA Vol (A2C):   36.2 ml 19.17  ml/m  RA Volume:   26.30 ml  13.92 ml/m LA Vol (A4C):   56.5 ml 29.91 ml/m LA Biplane Vol: 45.1 ml 23.88 ml/m  AORTIC VALVE LVOT Vmax:   93.80 cm/s LVOT Vmean:  59.200 cm/s LVOT VTI:    0.185 m  AORTA Ao Root diam: 3.70 cm MITRAL VALVE MV Area (PHT): 3.31 cm     SHUNTS MV Decel Time: 229 msec     Systemic VTI:  0.18 m MV E velocity: 45.10 cm/s   Systemic Diam: 2.00 cm MV A velocity: 104.00 cm/s MV E/A ratio:  0.43 Lonni Nanas MD Electronically signed by Lonni Nanas MD Signature Date/Time: 11/29/2024/3:02:19 PM    Final    EEG adult Result Date: 11/29/2024 Shelton Arlin KIDD, MD     11/29/2024 10:03 AM Patient Name: REI MEDLEN MRN: [id number] Epilepsy Attending: Arlin KIDD Shelton Referring Physician/Provider: Michaela Aisha SQUIBB, MD Date: 11/29/2024 Duration: 25.52 mins Patient history: 78yo M with decreased left-sided function and was less interactive. EEG to evaluate for seizure Level of alertness: Awake AEDs during EEG study: None Technical aspects: This EEG study was done with scalp electrodes positioned according to the 10-20 International system of electrode placement. Electrical activity was reviewed with band pass filter of 1-70Hz , sensitivity of 7 uV/mm, display speed of 80mm/sec with a 60Hz  notched filter applied as appropriate. EEG data were recorded continuously and digitally stored.  Video monitoring was available and reviewed as appropriate. Description: The posterior dominant rhythm consists of 7 Hz activity of moderate voltage (25-35 uV) seen predominantly in posterior head regions, symmetric and reactive to eye opening and eye closing. EEG showed continuous generalized and lateralized left hemisphere 5 to 7 Hz theta slowing admixed with intermittent 2-3hz  delta slowing. Hyperventilation and photic stimulation were not performed.   ABNORMALITY - Continuous slow, generalized and lateralized left hemisphere IMPRESSION: This study is suggestive of cortical dysfunction arising from left hemisphere likely secondary to underlying structural abnormality. Additionally there is generalized cerebral dysfunction (encephalopathy). No seizures or epileptiform discharges were seen throughout the recording. Arlin KIDD Shelton   MR BRAIN WO CONTRAST Result Date: 11/28/2024 EXAM: MRI BRAIN WITHOUT CONTRAST 11/28/2024 04:10:00 PM TECHNIQUE: Multiplanar multisequence MRI of the head/brain was performed without the administration of intravenous contrast.  COMPARISON: CT of the head dated 11/28/2024. CLINICAL HISTORY: Neuro deficit, acute, stroke suspected. FINDINGS: BRAIN AND VENTRICLES: No acute infarct. There is no restricted diffusion present to indicate acute or recent infarction. No intracranial hemorrhage. No mass. No midline shift. No hydrocephalus. Extensive encephalomalacia changes are again demonstrated within the left cerebral hemisphere with hemosiderin staining in the periventricular white matter of the left temporal and parietal lobes. There is extensive left cerebral gliosis. There is mild-to-moderate periventricular white matter disease. There is Wallerian degeneration of the left cerebral peduncle. The sella is unremarkable. Normal flow voids. ORBITS: The patient is status post left lens replacement. No acute abnormality. SINUSES AND MASTOIDS: Circumferential mucosal disease within the floors of the maxillary sinuses. There is mild right frontal sinus disease. BONES AND SOFT TISSUES: Normal marrow signal. No acute soft tissue abnormality. IMPRESSION: 1. No acute intracranial abnormality. No evidence of acute or recent infarct. 2. Extensive encephalomalacia within the left cerebral hemisphere with hemosiderin staining in the periventricular white matter of the left temporal and parietal lobes, extensive left cerebral gliosis, and Wallerian degeneration of the left cerebral peduncle. 3. Mild-to-moderate periventricular white matter disease. Electronically signed by: Evalene Coho MD 11/28/2024 06:09 PM EST RP Workstation: [id number]  CT ANGIO HEAD NECK W WO CM (CODE STROKE) Result Date: 11/28/2024 EXAM: CTA HEAD AND NECK WITH AND WITHOUT 11/28/2024 11:20:00 AM TECHNIQUE: CTA of the head and neck was performed with and without the administration of intravenous contrast. Multiplanar 2D and/or 3D reformatted images are provided for review. Automated exposure control, iterative reconstruction, and/or weight based adjustment of the mA/kV was  utilized to reduce the radiation dose to as low as reasonably achievable. Stenosis of the internal carotid arteries measured using NASCET criteria. COMPARISON: CT of the head dated 11/28/2024. CLINICAL HISTORY: Neuro deficit, acute, stroke suspected. FINDINGS: CTA NECK: AORTIC ARCH AND ARCH VESSELS: Mild calcific plaque within the thoracic aorta. No dissection or arterial injury. No significant stenosis of the brachiocephalic or subclavian arteries. CERVICAL CAROTID ARTERIES: Minimal calcific plaque within the right carotid bulb. No dissection, arterial injury, or hemodynamically significant stenosis by NASCET criteria. CERVICAL VERTEBRAL ARTERIES: No dissection, arterial injury, or significant stenosis. LUNGS AND MEDIASTINUM: Significant incidental finding of nonobstructive thromboembolic disease along the posterior wall of the left pulmonary artery. A follow up CT pulmonary arteriogram of the chest is recommended. SOFT TISSUES: No acute abnormality. BONES: No acute abnormality. CTA HEAD: ANTERIOR CIRCULATION: Calcific plaque within the carotid siphons, with less than 10% luminal stenosis present bilaterally. No significant stenosis of the anterior cerebral arteries. No significant stenosis of the middle cerebral arteries. No aneurysm. POSTERIOR CIRCULATION: No significant stenosis of the posterior cerebral arteries. No significant stenosis of the basilar artery. No significant stenosis of the vertebral arteries. No aneurysm. OTHER: No dural venous sinus thrombosis on this non-dedicated study. COMMUNICATION: The above findings and recommendations were communicated to Doctor Michaela at 11:37 AM 11/28/2024. IMPRESSION: 1. No large vessel occlusion, hemodynamically significant stenosis, or aneurysm in the head or neck. 2. Nonobstructive thromboembolic disease along the posterior wall of the left pulmonary artery. Follow-up CT pulmonary arteriogram of the chest is recommended. Findings and recommendations were  communicated to Dr. Michaela at 11:37 AM on 11/28/24. Electronically signed by: Evalene Coho MD 11/28/2024 11:41 AM EST RP Workstation: [id number]   CT HEAD CODE STROKE WO CONTRAST Result Date: 11/28/2024 EXAM: CT HEAD WITHOUT CONTRAST 11/28/2024 11:09:00 AM TECHNIQUE: CT of the head was performed without the administration of intravenous contrast. Automated exposure control, iterative reconstruction, and/or weight based adjustment of the mA/kV was utilized to reduce the radiation dose to as low as reasonably achievable. COMPARISON: CT of the head dated 12/08/2022. CLINICAL HISTORY: Neuro deficit, acute, stroke suspected. FINDINGS: BRAIN AND VENTRICLES: No acute hemorrhage. No hydrocephalus. No extra-axial collection. No mass effect or midline shift. There is age-related volume loss. Extensive encephalomalacia changes are present within the left cerebral hemisphere involving the left posterior frontal, left parietal, occipital, and medial temporal lobes. There is Wallerian degeneration of the left cerebral peduncle. There is a chronic lacunar infarct within the right thalamus. Moderate atheromatous calcification is noted within the carotid siphons. The above findings were communicated to Doctor Michaela at 11:18 AM 11/28/2024. [place] Stroke Program Early CT Score (ASPECTS) Ganglionic (caudate, IC, lentiform nucleus, insula, M1-M3): 7 Supraganglionic (M4-M6): 3 Total: 10 ORBITS: No acute abnormality. SINUSES: No acute abnormality. SOFT TISSUES AND SKULL: No acute soft tissue abnormality. No skull fracture. IMPRESSION: 1. No acute intracranial abnormality in the setting of suspected acute stroke. 2. Extensive chronic encephalomalacia throughout the left cerebral hemisphere (posterior frontal, parietal, occipital, and medial temporal lobes) with Wallerian degeneration of the left cerebral peduncle. 3. Chronic right thalamic lacunar infarct. 4. Moderate atheromatous calcification of the carotid siphons.  5. aspects: 10 Electronically signed by: Evalene Coho MD 11/28/2024 11:21 AM EST RP Workstation: [id number]      Subjective:  Patient seen and examined at bedside.  No acute distress.  Alert and oriented.  Known right-sided deficits.  Wife at the bedside.  She does not recall any bleeding issues in the past and is okay with anticoagulation long-term.  Discharge Exam: Vitals:   12/02/24 0700 12/02/24 1100  BP: (!) 148/72 139/77  Pulse: 75   Resp: 17 19  Temp: 98.3 F (36.8 C) 97.8 F (36.6 C)  SpO2: 95%     General: Pt is alert, awake, not in acute distress Cardiovascular: rate controlled, S1/S2 + Respiratory: bilateral decreased breath sounds at bases Abdominal: Soft, NT, ND, bowel sounds + Extremities: no edema, no cyanosis Neuro: Right upper and lower extremity weakness    The results of significant diagnostics from this hospitalization (including imaging, microbiology, ancillary and laboratory) are listed below for reference.     Microbiology: Recent Results (from the past 240 hours)  MRSA Next Gen by PCR, Nasal     Status: Abnormal   Collection Time: 11/30/24  4:03 PM   Specimen: Nasal Mucosa; Nasal Swab  Result Value Ref Range Status   MRSA by PCR Next Gen DETECTED (A) NOT DETECTED Final    Comment: (NOTE) The GeneXpert MRSA Assay (FDA approved for NASAL specimens only), is one component of a comprehensive MRSA colonization surveillance program. It is not intended to diagnose MRSA infection nor to guide or monitor treatment for MRSA infections. Test performance is not FDA approved in patients less than 52 years old. Performed at [facility], [address], [place], [place] [address]      Labs: BNP (last 3 results) No results for input(s): BNP in the last 8760 hours. Basic Metabolic Panel: Recent Labs  Lab 11/28/24 1106 11/28/24 1150 11/30/24 1151 12/02/24 0543  NA 143 140 134* 139  K 5.3* 4.4 4.5 4.2  CL 106 106 98 103  CO2  --  28 29  28   GLUCOSE 153* 135* 296* 136*  BUN 54* 41* 30* 24*  CREATININE 2.10* 1.90* 1.68* 1.49*  CALCIUM   --  9.4 9.2 9.5  PHOS  --   --  2.1* 2.8   Liver Function Tests: Recent Labs  Lab 11/28/24 1150 11/30/24 1151 12/02/24 0543  AST 23  --   --   ALT 15  --   --   ALKPHOS 96  --   --   BILITOT 0.6  --   --   PROT 5.9*  --   --   ALBUMIN  3.1* 3.2* 3.3*   No results for input(s): LIPASE, AMYLASE in the last 168 hours. No results for input(s): AMMONIA in the last 168 hours. CBC: Recent Labs  Lab 11/28/24 1106 11/28/24 1111 11/30/24 0438 12/01/24 0547 12/02/24 0543  WBC  --  9.9 8.3 7.7 8.2  NEUTROABS  --  5.4  --   --   --   HGB 16.7 16.4 15.7 15.7 15.7  HCT 49.0 50.7 48.1 46.2 47.0  MCV  --  95.1 94.1 92.2 92.7  PLT  --  182 171 145* 146*   Cardiac Enzymes: No results for input(s): CKTOTAL, CKMB, CKMBINDEX, TROPONINI in the last 168 hours. BNP: Invalid input(s): POCBNP CBG: Recent Labs  Lab 12/01/24 2043 12/02/24 0004 12/02/24 0500 12/02/24 0752 12/02/24 1222  GLUCAP 204* 96 142* 139* 165*   D-Dimer No results for input(s): DDIMER in  the last 72 hours. Hgb A1c No results for input(s): HGBA1C in the last 72 hours. Lipid Profile No results for input(s): CHOL, HDL, LDLCALC, TRIG, CHOLHDL, LDLDIRECT in the last 72 hours. Thyroid  function studies No results for input(s): TSH, T4TOTAL, T3FREE, THYROIDAB in the last 72 hours.  Invalid input(s): FREET3 Anemia work up No results for input(s): VITAMINB12, FOLATE, FERRITIN, TIBC, IRON, RETICCTPCT in the last 72 hours. Urinalysis    Component Value Date/Time   COLORURINE AMBER (A) 11/30/2024 1619   APPEARANCEUR CLOUDY (A) 11/30/2024 1619   LABSPEC 1.020 11/30/2024 1619   PHURINE 6.5 11/30/2024 1619   GLUCOSEU >=500 (A) 11/30/2024 1619   HGBUR LARGE (A) 11/30/2024 1619   BILIRUBINUR NEGATIVE 11/30/2024 1619   KETONESUR NEGATIVE 11/30/2024 1619   PROTEINUR 100  (A) 11/30/2024 1619   UROBILINOGEN 1.0 08/23/2012 1732   NITRITE POSITIVE (A) 11/30/2024 1619   LEUKOCYTESUR MODERATE (A) 11/30/2024 1619   Sepsis Labs Recent Labs  Lab 11/28/24 1111 11/30/24 0438 12/01/24 0547 12/02/24 0543  WBC 9.9 8.3 7.7 8.2   Microbiology Recent Results (from the past 240 hours)  MRSA Next Gen by PCR, Nasal     Status: Abnormal   Collection Time: 11/30/24  4:03 PM   Specimen: Nasal Mucosa; Nasal Swab  Result Value Ref Range Status   MRSA by PCR Next Gen DETECTED (A) NOT DETECTED Final    Comment: (NOTE) The GeneXpert MRSA Assay (FDA approved for NASAL specimens only), is one component of a comprehensive MRSA colonization surveillance program. It is not intended to diagnose MRSA infection nor to guide or monitor treatment for MRSA infections. Test performance is not FDA approved in patients less than 61 years old. Performed at [facility], [address] [place], [place] [address]      Time coordinating discharge: 35 minutes  SIGNED:   Derryl Duval, MD  [facility] 12/02/2024, 2:37 PM       [1] No Known Allergies

## 2024-12-02 NOTE — Care Management Important Message (Signed)
 Important Message  Patient Details  Name: [name] MRN: [id number] Date of Birth: 1946/04/06   Important Message Given:  Yes - Medicare IM     [name] 12/02/2024, 12:17 PM

## 2024-12-03 ENCOUNTER — Encounter: Payer: Self-pay | Admitting: Adult Health

## 2024-12-03 ENCOUNTER — Non-Acute Institutional Stay (SKILLED_NURSING_FACILITY): Payer: Self-pay | Admitting: Adult Health

## 2024-12-03 DIAGNOSIS — G8111 Spastic hemiplegia affecting right dominant side: Secondary | ICD-10-CM

## 2024-12-03 DIAGNOSIS — K5909 Other constipation: Secondary | ICD-10-CM | POA: Diagnosis not present

## 2024-12-03 DIAGNOSIS — I700 Atherosclerosis of aorta: Secondary | ICD-10-CM

## 2024-12-03 DIAGNOSIS — K219 Gastro-esophageal reflux disease without esophagitis: Secondary | ICD-10-CM | POA: Diagnosis not present

## 2024-12-03 DIAGNOSIS — E1169 Type 2 diabetes mellitus with other specified complication: Secondary | ICD-10-CM | POA: Diagnosis not present

## 2024-12-03 DIAGNOSIS — I2699 Other pulmonary embolism without acute cor pulmonale: Secondary | ICD-10-CM

## 2024-12-03 DIAGNOSIS — E1129 Type 2 diabetes mellitus with other diabetic kidney complication: Secondary | ICD-10-CM

## 2024-12-03 DIAGNOSIS — I4891 Unspecified atrial fibrillation: Secondary | ICD-10-CM

## 2024-12-03 DIAGNOSIS — F0150 Vascular dementia without behavioral disturbance: Secondary | ICD-10-CM

## 2024-12-03 DIAGNOSIS — N1831 Chronic kidney disease, stage 3a: Secondary | ICD-10-CM

## 2024-12-03 DIAGNOSIS — Z794 Long term (current) use of insulin: Secondary | ICD-10-CM | POA: Diagnosis not present

## 2024-12-03 DIAGNOSIS — I739 Peripheral vascular disease, unspecified: Secondary | ICD-10-CM | POA: Diagnosis not present

## 2024-12-03 DIAGNOSIS — I25118 Atherosclerotic heart disease of native coronary artery with other forms of angina pectoris: Secondary | ICD-10-CM | POA: Diagnosis not present

## 2024-12-03 DIAGNOSIS — I6930 Unspecified sequelae of cerebral infarction: Secondary | ICD-10-CM

## 2024-12-03 NOTE — Progress Notes (Signed)
 " Location:  [facility] Room Number: [id number] Place of Service:  SNF (31)   CODE STATUS: dnr  Allergies[1]  Chief Complaint  Patient presents with   Hospitalization Follow-up    HPI:  He is a 78 year old man who has been hospitalized from 11-28-24 through 12-02-24. His past medical history includes: CVA CAD a-fib; dvt; diabetes type 2, dementia. He presented to the ED with concerns for a repeat cva with left side weakness and lack of verbal response.  The presentation was concerned for acute toxic metabolic encephalopathy that resolved. EEG was negative; MRI Of brain and CTA of head neck were negative found incidental PE. He was started on a heparin  drip. He did have some gross hematuria which resolved. He was started on eliquis  5 mg twice daily; with heparin  stopped. The CT also indicated a right middle lobe nodule which will need a follow up ct in 3 months. He does have a history of dvt in 2023. On left main artery.  Today he is more lethargic; but does respond to stimuli. He will continue to be followed for his chronic illnesses including: Chronic generalized pain: Aortic atherosclerosis  CAD native heart without angina    Past Medical History:  Diagnosis Date   A-fib (HCC)    a. Post-op afib after CABG 08/2012.   Acute gastric ulcer with hemorrhage    Acute respiratory failure with hypoxia (HCC)    Aphasia following cerebral infarction    CAD (coronary artery disease)    a. NSTEMI s/p stent to distal RCA 03/2000. b. Inf MI s/p emergent thrombectomy/stenting mid RCA 10/2000. c. NSTEMI s/p CABGx4 (LIMA-LAD, SVG-OM2, seq SVG-acute marginal and PD) 08/16/12 - course complicated by confusion, post-op AF, L pleural effusion with thoracentesis. d. Normal LV function by echo 08/2012.   Diverticulosis    DM with CKD    Dysphagia following cerebral infarction    Encephalopathy    Extended spectrum beta lactamase (ESBL) resistance    Generalized anxiety disorder    GERD  (gastroesophageal reflux disease)    Hemiplegia and hemiparesis following cerebral infarction affecting right dominant side (HCC)    Hiatal hernia    HLD (hyperlipidemia)    HTN (hypertension)    Non-ST elevation (NSTEMI) myocardial infarction [facility])    Nontraumatic intracerebral hemorrhage in hemisphere, subcortical (HCC)    Peripheral vascular disease    a. Carotid dopplers neg 08/13/12. b. Pre-cabg ABIs - R=0.87 suggesting mild dz, L=1.29 possibly falsely elevated due to calcified vessels.   Pleural effusion    a. L pleural eff after CABG s/p thoracentesis 08/22/12.   Prostate cancer [facility])    Status post radiation treatment.   Prostatic hypertrophy    a. Hx of urinary retention, awaiting TURP.   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 Mild  MR by TEE 08/2012.    Past Surgical History:  Procedure Laterality Date   APPENDECTOMY     BACK SURGERY     CORONARY ARTERY BYPASS GRAFT  08/16/2012   Procedure: CORONARY ARTERY BYPASS GRAFTING (CABG);  Surgeon: Elspeth JAYSON Millers, MD;  Location: [facility] OR;  Service: Open Heart Surgery;  Laterality: N/A;   IR REPLC GASTRO/COLONIC TUBE PERCUT W/FLUORO  02/13/2019   LEFT HEART CATHETERIZATION WITH CORONARY ANGIOGRAM N/A 08/14/2012   Procedure: LEFT HEART CATHETERIZATION WITH CORONARY ANGIOGRAM;  Surgeon: Peter M Jordan, MD;  Location: [facility] CATH LAB;  Service: Cardiovascular;  Laterality: N/A;    Social History   Socioeconomic History   Marital status: Married    Spouse name: Not on file   Number of children: Not on file   Years of education: Not on file   Highest education level: Not on file  Occupational History   Occupation: retired   Tobacco Use   Smoking status: Former    Current packs/day: 0.00    Types: Cigarettes    Quit date: 11/12/1972    Years since quitting: 52.0   Smokeless tobacco: Never  Vaping Use   Vaping  status: Never Used  Substance and Sexual Activity   Alcohol use: No   Drug use: No   Sexual activity: Not Currently  Other Topics Concern   Not on file  Social History Narrative   He is a long term patient of [facility]    Social Drivers of Health   Tobacco Use: Medium Risk (12/03/2024)   Patient History    Smoking Tobacco Use: Former    Smokeless Tobacco Use: Never    Passive Exposure: Not on Actuary Strain: Not on file  Food Insecurity: No Food Insecurity (11/29/2024)   Epic    Worried About Programme Researcher, Broadcasting/film/video in the Last Year: Never true    Ran Out of Food in the Last Year: Never true  Transportation Needs: No Transportation Needs (11/29/2024)   Epic    Lack of Transportation (Medical): No    Lack of Transportation (Non-Medical): No  Physical Activity: Not on file  Stress: Not on file  Social Connections: Moderately Integrated (11/29/2024)   Social Connection and Isolation Panel    Frequency of Communication with Friends and Family: Once a week    Frequency of Social Gatherings with Friends and Family: Three times a week    Attends Religious Services: 1 to 4 times per year    Active Member of Clubs or Organizations: No    Attends Banker Meetings: Never    Marital Status: Married  Catering Manager Violence: Not At Risk (11/29/2024)   Epic    Fear of Current or Ex-Partner: No    Emotionally Abused: No    Physically Abused: No    Sexually Abused: No  Depression (PHQ2-9): Low Risk (09/29/2023)   Depression (PHQ2-9)    PHQ-2 Score: 0  Alcohol Screen: Not on file  Housing: Low Risk (11/29/2024)   Epic    Unable to Pay for Housing in the Last Year: No    Number of Times Moved in the Last Year: 0    Homeless in the Last Year: No  Utilities: Not At Risk (11/29/2024)   Epic    Threatened with loss of utilities: No  Health Literacy: Not on file   Family History  Problem Relation Age of Onset   Hypertension Mother       VITAL SIGNS BP  116/74   Pulse 68   Temp 98.9 F (37.2 C)   Resp 20   Ht 6' (1.829 m)   Wt 166 lb (75.3 kg)   SpO2 94%   BMI 22.51 kg/m   Outpatient Encounter Medications as of 12/03/2024  Medication Sig   acetaminophen  (TYLENOL ) 325 MG tablet Take 650 mg by mouth 2 (two) times daily.   amLODipine  (NORVASC ) 2.5 MG tablet Take 2.5 mg by mouth daily.   apixaban  (ELIQUIS ) 5 MG TABS tablet Take 1 tablet (  5 mg total) by mouth 2 (two) times daily.   Baclofen 5 MG TABS Take 5 mg by mouth 4 (four) times daily.   Balsam [place]-Castor Oil (VENELEX) OINT Apply 1 Application topically See admin instructions. Apply topicallly every shift to bilateral buttocks and sacrum   clotrimazole (LOTRIMIN) 1 % cream Apply 1 Application topically See admin instructions. Apply every shift as needed to head of penis   Continuous Glucose Sensor (FREESTYLE LIBRE 3 SENSOR) MISC 1 Device by Does not apply route every 14 (fourteen) days. Place 1 sensor on the skin every 14 days. Use to check glucose continuously   Eyelid Cleansers (OCUSOFT LID SCRUB ORIGINAL) PADS Apply 1 Application topically 2 (two) times daily as needed (to clean eye.).   FLUoxetine  (PROZAC ) 10 MG capsule Take 10 mg by mouth daily.   insulin  glargine (LANTUS ) 100 UNIT/ML injection Inject 24 Units into the skin daily.   insulin  lispro (HUMALOG ) 100 UNIT/ML cartridge Inject 5 Units into the skin See admin instructions. Inject 5 units into the skin with meals.   loratadine (CLARITIN) 10 MG tablet Take 10 mg by mouth daily as needed for allergies.   losartan (COZAAR) 25 MG tablet Take 25 mg by mouth daily.   Nutritional Supplements (ENSURE ENLIVE PO) Take 1 Can by mouth 2 (two) times daily.   rosuvastatin  (CRESTOR ) 10 MG tablet Take 10 mg by mouth at bedtime.   sennosides-docusate sodium  (SENOKOT-S) 8.6-50 MG tablet Take 1 tablet by mouth in the morning and at bedtime.   No facility-administered encounter medications on file as of 12/03/2024.     SIGNIFICANT  DIAGNOSTIC EXAMS  LABS REVIEWED PREVIOUS:      01-01-24: ACR 256 04-25-24: wbc 7.6; hgb 15.9; hct 49.9; mcv 93.8 plt 176; glucose 84; bun 32; creat 1.48 ;k+ 4.0; na++ 134; ca 9.2 gfr 48; protein 6.8 albumin  2.8; tsh 2.449; psa 0.96; hgb A1c 7.5; chol 166; ldl 114; trig 133; hdl 25   TODAY  09-26-24: hgb A1c 7.5 11-28-24; wbc 9.9; hgb 16.4; hct 50.7; mcv 95.1 plt 182; glucose 135; bun 41; creat 1.90; k+ 4.4; na++ 140; ca 9.4 gfr 35; protein 5.9 albumin  3.1 12-02-24: wbc 8.2; hgb 15.7; hct 47.0; mcv 92.7 plt 146; glucose 136; bun 24; creat 1.49; k+ 4.2; na++ 139; ca 9.5 gfr 47; albumin  3.3 phos 2.8    Review of Systems  Reason unable to perform ROS: aphasia; lethargy.   Physical Exam Constitutional:      General: He is not in acute distress.    Appearance: He is well-developed. He is not diaphoretic.  Neck:     Thyroid : No thyromegaly.  Cardiovascular:     Rate and Rhythm: Normal rate and regular rhythm.     Pulses: Normal pulses.     Heart sounds: Normal heart sounds.  Pulmonary:     Effort: Pulmonary effort is normal. No respiratory distress.     Breath sounds: Normal breath sounds.  Abdominal:     General: Bowel sounds are normal. There is no distension.     Palpations: Abdomen is soft.     Tenderness: There is no abdominal tenderness.  Musculoskeletal:     Cervical back: Neck supple.     Right lower leg: No edema.     Left lower leg: No edema.     Comments:  Right hemiplegia    Lymphadenopathy:     Cervical: No cervical adenopathy.  Skin:    General: Skin is warm and dry.  Neurological:  Mental Status: He is alert. Mental status is at baseline.  Psychiatric:        Mood and Affect: Mood normal.       ASSESSMENT/ PLAN:   TODAY:   Pulmonary embolism/history of DVT: will continue eliquis  5 mg twice daily   2. Chronic generalized pain: will continue tylenol  650 mg twice daily   3. Aortic atherosclerosis (ct 01-04-19) is on statin  4. CAD native heart  without angina: s/p cabg 2012; cozaar 25 mg daily     5. Hypertension associated with type 2 diabetes mellitus: b/p 116/74 :will continue cozaar 25 mg daily and norvasc  2.5 mg daily asa 81 mg daily   6. Osteopenia: t scoe -1.421 is on supplements  7. Dyslipidemia associated with type 2 diabetes mellitus: LDL 114 will continue crestor  10 mg daily   8. Micro-albuminuria due to type 2 diabetes mellitus: 382.3 is on ARB .  9. Major depression with psychotic features: is taking prozac  10 mg daily   10. Chronic constipation: is on senna s twice daily   11. Chronic urine retention/bladder spasms: foley is out; will monitor   12. GERD without esophagitis: is now off PPI   13. Chronic non-seasonal allergic rhinitis: will continue claritin 10 mg daily   14. CKD stage 3 due to type 2 diabetes mellitus: bun 24; creat 1.49; gfr 47  15. Type 2 diabetes mellitus with peripheral vascular disease: hgb A1c 7.5 will continue humalog  5 units with meals twice daily; lantus  24 units nightly is on statin and arb   16. Protein calorie malnutrition severe: albumin  3.3 will continue supplements as directed  17. History of nontraumatic subcortical hemorrhage of left cerebral hemisphere/right spastic hemiplegia (05-22-21); will continue baclofen 5 mg four times daily   18. Restless leg syndrome: is presently not on medications.      Barnie Seip NP [facility]  call [phone]     [1] No Known Allergies  "

## 2024-12-04 ENCOUNTER — Encounter: Payer: Self-pay | Admitting: Internal Medicine

## 2024-12-04 ENCOUNTER — Non-Acute Institutional Stay (SKILLED_NURSING_FACILITY): Payer: Self-pay | Admitting: Internal Medicine

## 2024-12-04 DIAGNOSIS — E1151 Type 2 diabetes mellitus with diabetic peripheral angiopathy without gangrene: Secondary | ICD-10-CM | POA: Diagnosis not present

## 2024-12-04 DIAGNOSIS — E43 Unspecified severe protein-calorie malnutrition: Secondary | ICD-10-CM | POA: Diagnosis not present

## 2024-12-04 DIAGNOSIS — R911 Solitary pulmonary nodule: Secondary | ICD-10-CM | POA: Diagnosis not present

## 2024-12-04 DIAGNOSIS — N1831 Chronic kidney disease, stage 3a: Secondary | ICD-10-CM | POA: Diagnosis not present

## 2024-12-04 DIAGNOSIS — I2699 Other pulmonary embolism without acute cor pulmonale: Secondary | ICD-10-CM

## 2024-12-04 NOTE — Patient Instructions (Signed)
 See assessment and plan under each diagnosis in the problem list and acutely for this visit

## 2024-12-04 NOTE — Assessment & Plan Note (Signed)
 Hospitalized 11/29/11/22/25 glucoses ranged from 135 up to 296; A1c was 7.5% on 09/26/2024 indicating adequate control.  Critical is to avoid hypoglycemia which would mimic TIAs.  No change indicated.

## 2024-12-04 NOTE — Assessment & Plan Note (Signed)
 He is essentially nonverbal and cannot provide information as to any cardiopulmonary symptoms.  O2 sats are excellent on room air and he exhibits no respiratory distress.  Continue the Eliquis .

## 2024-12-04 NOTE — Assessment & Plan Note (Signed)
 Speech therapy prescribed special cup to enhance fluid intake of liquids such as tea and water .  Wife will work aggressively to encourage him to take in fluids other than milk.

## 2024-12-04 NOTE — Progress Notes (Unsigned)
 "   NURSING HOME LOCATION:  [facility] ROOM NUMBER: [id number]  CODE STATUS: DNR  PCP: Landy Barnie RAMAN, NP   This is a nursing facility follow up visit for Nursing Facility readmission within 30 days.  Interim medical record and care since last SNF visit was updated with review of diagnostic studies and change in clinical status since last visit were documented.  HPI: He was rehospitalized 12/18 - 12/02/2024 admitted from the SNF with concerns for recurrent CVA manifested as left-sided weakness and decreased verbal responsiveness.  CT of the head and MRI revealed extensive encephalomalacia within the left cerebral hemisphere and extensive left temporal gliosis.  There was no acute infarct.  CTA did show an incidental chronic PTE as well as an incidental finding of a nodule.  Heparin  was initiated with subsequent transition to Eliquis  orally. Initial potassium was elevated at 5.0; final value was 4.2.  While hospitalized glucoses ranged from the low 135 up to high of 296.  The most recent A1c was 7.5% on 09/26/2024.  This would represent adequate control in the context of his advanced age and multiple comorbidities.  Initial creatinine was 2.0 initial BUN was 54 indicating dehydration.  According to his wife it is extremely difficult to get him to take fluids.  Final BUN was 24 indicating almost complete resolution of the prerenal azotemia.  With a GFR of 35 suggesting CKD stage IIIb.  Final creatinine was 1.49 with GFR 47 indicating low stage IIIa CKD.  Albumin  was 3.3 and total protein 5.9.  He did exhibit mild thrombocytopenia with a nadir platelet count of 146,000. Despite the incidentally noted nonobstructive thromboembolic disease along the posterior wall of the left pulmonary artery; he had no obvious cardiopulmonary signs or symptoms. He was felt to be back at baseline and was discharged back to the SNF where he is a permanent resident.  Review of systems could not be completed  as he was essentially nonverbal.  According to his wife while hospitalized he had 2 male nurses which caused him to be agitated.  He also exhibited some behavioral issues, stripping off his gown and the cardiac monitoring pads.  Speech therapy apparently was consulted and ordered a special cup to be used with thin liquids such as tea and water  to prevent aspiration by decreasing the flow of the fluid through the exit port.   Physical exam:  Pertinent or positive findings: She was initially asleep; his wife states that he has been markedly somnolent intermittently since his return.  He did open his eyes and did open his mouth for an intraoral exam but remained nonverbal throughout the exam.  There is papule above the right eye at the mid supraorbital area.  Has eschar above the left supraorbital area medially.  His wife states he intermittently will scratch this area.  There is marked decrease in the density of the eyebrows.  When he did open his eyes ptosis was present greater on the left than the right.  With his eyes open he exhibited a blank stare.  He is missing the anterior maxillary incisor teeth.  Breath sounds are decreased.  Bowel sounds are active.  Pedal pulses are decreased to palpation.  Interosseous wasting the hands is present.  He keeps the right hand fisted but the fingers can be extended passively.  He exhibits intermittent mild tonic jerking of the left upper extremity. General appearance: Adequately nourished; no acute distress, increased work of breathing is present.   Lymphatic: No  lymphadenopathy about the head, neck, axilla. Eyes: No conjunctival inflammation or lid edema is present. There is no scleral icterus. Ears:  External ear exam shows no significant lesions or deformities.   Nose:  External nasal examination shows no deformity or inflammation. Nasal mucosa are pink and moist without lesions, exudates Oral exam:  Lips and gums are healthy appearing. There is no oropharyngeal  erythema or exudate. Neck:  No thyromegaly, masses, tenderness noted.    Heart:  Normal rate and regular rhythm. S1 and S2 normal without gallop, murmur, click, rub .  Lungs: Chest clear to auscultation without wheezes, rhonchi, rales, rubs. Abdomen: Bowel sounds are normal. Abdomen is soft and nontender with no organomegaly, hernias, masses. GU: Deferred  Extremities:  No cyanosis, clubbing, edema  Neurologic exam : Cn 2-7 intact Strength equal  in upper & lower extremities Balance, Rhomberg, finger to nose testing could not be completed due to clinical state Deep tendon reflexes are equal Skin: Warm & dry w/o tenting. No significant lesions or rash.  See summary under each active problem in the Problem List with associated updated therapeutic plan  "

## 2024-12-04 NOTE — Assessment & Plan Note (Signed)
 Surveillance will be pursued as per radiology recommendations.

## 2024-12-04 NOTE — Assessment & Plan Note (Signed)
 Current albumin  3.3 and total protein 5.9.  Interosseous wasting and limb atrophy documented.  Nutritionist will monitor, but nutritional intake is compromised by his severe neurocognitive process & high risk for aspiration.

## 2024-12-09 ENCOUNTER — Other Ambulatory Visit: Payer: Self-pay

## 2024-12-09 ENCOUNTER — Ambulatory Visit (HOSPITAL_COMMUNITY)
Admission: RE | Admit: 2024-12-09 | Discharge: 2024-12-09 | Disposition: A | Discharge status: Home / Self Care | Source: Ambulatory Visit | Attending: Adult Health | Admitting: Adult Health

## 2024-12-09 ENCOUNTER — Ambulatory Visit (HOSPITAL_COMMUNITY): Attending: Adult Health | Admitting: Speech Pathology

## 2024-12-09 ENCOUNTER — Encounter (HOSPITAL_COMMUNITY): Payer: Self-pay | Admitting: Speech Pathology

## 2024-12-09 DIAGNOSIS — R1312 Dysphagia, oropharyngeal phase: Secondary | ICD-10-CM | POA: Insufficient documentation

## 2024-12-09 DIAGNOSIS — I639 Cerebral infarction, unspecified: Secondary | ICD-10-CM | POA: Diagnosis present

## 2024-12-09 NOTE — Therapy (Signed)
 Modified Barium Swallow Study  Patient Details  Name: [name] MRN: [id number] Date of Birth: September 23, 1946  Today's Date: 12/09/2024   End of Session - 12/09/24 1447     Visit Number 1    Number of Visits 1    Authorization Type [employer]    SLP Start Time 1340    SLP Stop Time  1420    SLP Time Calculation (min) 40 min    Activity Tolerance Patient tolerated treatment well           HPI/PMH: HPI: [name] is a 78 y.o. male who was referred for MBSS by Tanner Barnie RAMAN, NP from [facility]. Pt with past medical history significant of hypertension, hyperlipidemia, diabetes, CKD 3B, PAD, GERD, CVA, CAD, A-fib, DVT, dementia, depression. He was rehospitalized 12/18 - 12/02/2024 , admitted from the SNF with concerns for recurrent CVA , manifested as left-sided weakness and decreased verbal responsiveness.  CT of the head and MRI revealed extensive encephalomalacia within the left cerebral hemisphere and extensive left temporal gliosis. There was no acute infarct.  CTA did show an incidental chronic PTE as well as an incidental finding of a nodule.  Heparin  was initiated with subsequent transition to Eliquis  orally. Patient resides at [facility] nursing center.  Has some baseline right-sided deficits from prior stroke; Recommended Dysphagia 3/thin via provale cup.  ST f/u for diet tolerance/dysphagia tx.   I69.391 (ICD-10-CM) - Dysphagia following cerebral infarction R13.12 (ICD-10-CM) - Oropharyngeal dysphagia   MBSS from 03/20/2022 <<Pt presents with primary cognitive based mild oropharyngeal dysphagia. Pt refused majority of trials attempted and eventually Pt's wife was asked to help facilitate and encourage Pt. Pt did demonstrate moderate amounts of silent aspiration with thin liquids via straw (Pt was more receptive to thin trials via straw). Limited trials of thin via cup resulted in penetration but no aspiration. Wife reports Pt drinks thin out of a controlled cup that only releases  minimal amounts of liquid at a time and they have been adhering to the strategy of no straws for this patient at the facility. PT took a bite of regular but quickly expectorated it. With mech soft and puree textures note a timely oral stage followed by a timely swallowing trigger with trace pharyngeal residue cleared by reflexive repeat swallow. Based on limited trials consumed recommend D3/mechanical soft and thin liquids with NO STRAWS. Recommend meds be administered crushed in puree. Per family report Pt self-feeds soft sandwiches and is unable to have these on D2 diet. Anticipate Pt will be more receptive to eat when he can self feed and eat foods of preference. HOWEVER; note Pt should not be fed PO if he is lethargic or not aware or responsive to PO provided. Please note this recommendation is based on a brief time with limited trials and fluctuating mentation should be considered when providing PO. Defer further recommendations to treating SLP at facility. Thank you for this referral>>   Clinical Impression: Clinical Impression: Pt presents with mild/mod sensorimotor and cognitive based oropharyngeal dysphagia with labial weakness resulting in spillage of thins , impaired lingual movement, decreased tongue base retraction, delay in swallow trigger at the level of the pyriforms at times resulting in penetration/aspiration of thins on the residuals/second swallow when taking self presented cup sips. Pt was asked to take a single, small sip but was unable to follow commands. Aspiration was silent and was a min amount. Residuals noted in the valleculae with puree and solids due to decreased tongue  base retraction, but cleared with repeat/dry swallow. Pills were not administered as Pt's wife reports that he takes crushed in puree. No penetration or aspiration observed with self presented NTL. Recommend D3/mech soft and consider NTL during meals if Pt will be self presenting or thin liquids in the Provale cup or  between meals. Pt will need supervision with liquid intake due to high risk for aspiration. Treating SLP can decided on thins vs NTL.   Factors that may increase risk of adverse event in presence of aspiration [name] 2021): Factors that may increase risk of adverse event in presence of aspiration [name] 2021): Reduced cognitive function; Limited mobility; Dependence for feeding and/or oral hygiene   Recommendations/Plan: Swallowing Evaluation Recommendations Swallowing Evaluation Recommendations Recommendations: PO diet; Free water  protocol after oral care PO Diet Recommendation: Dysphagia 3 (Mechanical soft); Thin liquids (Level 0); Mildly thick liquids (Level 2, nectar thick) Liquid Administration via: Cup; No straw (Provale cup) Medication Administration: Crushed with puree Supervision: Staff to assist with self-feeding; Full assist for feeding; Full supervision/cueing for swallowing strategies Swallowing strategies  : Slow rate; Small bites/sips; Multiple dry swallows after each bite/sip Postural changes: Position pt fully upright for meals; Stay upright 30-60 min after meals; Out of bed for meals Oral care recommendations: Oral care BID (2x/day); Staff/trained caregiver to provide oral care    Treatment Plan Treatment Plan Treatment recommendations: Defer treatment plan to SLP at other venue (see follow-up recommendations) Follow-up recommendations: Skilled nursing-short term rehab (<3 hours/day) Recommendations Comment: consider NTL with meals and offer thin via provale cup between meals     Recommendations Recommendations for follow up therapy are one component of a multi-disciplinary discharge planning process, led by the attending physician.  Recommendations may be updated based on patient status, additional functional criteria and insurance authorization.  Assessment: Orofacial Exam: Orofacial Exam Oral Cavity: Oral Hygiene: WFL Oral Cavity -  Dentition: Missing dentition Orofacial Anatomy: WFL Oral Motor/Sensory Function: WFL    Anatomy:  Anatomy: WFL   Boluses Administered: Boluses Administered Boluses Administered: Thin liquids (Level 0); Mildly thick liquids (Level 2, nectar thick); Puree; Solid     Oral Impairment Domain: Oral Impairment Domain Lip Closure: Escape beyond mid-chin Tongue control during bolus hold: Posterior escape of less than half of bolus Bolus preparation/mastication: Slow prolonged chewing/mashing with complete recollection Bolus transport/lingual motion: Slow tongue motion Oral residue: Residue collection on oral structures Location of oral residue : Tongue Initiation of pharyngeal swallow : Pyriform sinuses     Pharyngeal Impairment Domain: Pharyngeal Impairment Domain Soft palate elevation: No bolus between soft palate (SP)/pharyngeal wall (PW) Laryngeal elevation: Complete superior movement of thyroid  cartilage with complete approximation of arytenoids to epiglottic petiole Anterior hyoid excursion: Complete anterior movement Epiglottic movement: Complete inversion Laryngeal vestibule closure: Incomplete, narrow column air/contrast in laryngeal vestibule Pharyngeal stripping wave : Present - complete Pharyngeal contraction (A/P view only): N/A Pharyngoesophageal segment opening: Complete distension and complete duration, no obstruction of flow Tongue base retraction: Wide column of contrast or air between tongue base and PPW Pharyngeal residue: Collection of residue within or on pharyngeal structures Location of pharyngeal residue: Valleculae; Tongue base     Esophageal Impairment Domain: Esophageal Impairment Domain Esophageal clearance upright position: Complete clearance, esophageal coating    Pill: Pill Consistency administered: -- (Pt takes pills crushed; N/A)    Penetration/Aspiration Scale Score: Penetration/Aspiration Scale Score 1.  Material does not enter  airway: Mildly thick liquids (Level 2, nectar thick); Puree; Solid 8.  Material  enters airway, passes BELOW cords without attempt by patient to eject out (silent aspiration) : Thin liquids (Level 0)    Compensatory Strategies: Compensatory Strategies Compensatory strategies: No      General Information: Caregiver present: Yes   Diet Prior to this Study: Dysphagia 3 (mechanical soft); Thin liquids (Level 0)    Temperature : Normal    Respiratory Status: WFL    Supplemental O2: None (Room air)    History of Recent Intubation: No   Behavior/Cognition: Alert; Requires cueing; Cooperative  Self-Feeding Abilities: Able to self-feed; Needs set-up for self-feeding; Needs assist with self-feeding  Baseline vocal quality/speech: Normal  Volitional Cough: Unable to elicit  Volitional Swallow: Able to elicit  Exam Limitations: Poor positioning  Pain: Pain Assessment Pain Assessment: No/denies pain   End of Session: Start Time:No data recorded Stop Time: No data recorded Time Calculation:No data recorded Charges: No data recorded SLP visit diagnosis: SLP Visit Diagnosis: Dysphagia, oropharyngeal phase (R13.12)   Past Medical History:  Past Medical History:  Diagnosis Date   A-fib (HCC)    a. Post-op afib after CABG 08/2012.   Acute gastric ulcer with hemorrhage    Acute respiratory failure with hypoxia (HCC)    Aphasia following cerebral infarction    CAD (coronary artery disease)    a. NSTEMI s/p stent to distal RCA 03/2000. b. Inf MI s/p emergent thrombectomy/stenting mid RCA 10/2000. c. NSTEMI s/p CABGx4 (LIMA-LAD, SVG-OM2, seq SVG-acute marginal and PD) 08/16/12 - course complicated by confusion, post-op AF, L pleural effusion with thoracentesis. d. Normal LV function by echo 08/2012.   Diverticulosis    DM with CKD    Dysphagia following cerebral infarction    Encephalopathy    Extended spectrum beta lactamase (ESBL) resistance    Generalized anxiety disorder     GERD (gastroesophageal reflux disease)    Hemiplegia and hemiparesis following cerebral infarction affecting right dominant side (HCC)    Hiatal hernia    HLD (hyperlipidemia)    HTN (hypertension)    Non-ST elevation (NSTEMI) myocardial infarction [facility])    Nontraumatic intracerebral hemorrhage in hemisphere, subcortical (HCC)    Peripheral vascular disease    a. Carotid dopplers neg 08/13/12. b. Pre-cabg ABIs - R=0.87 suggesting mild dz, L=1.29 possibly falsely elevated due to calcified vessels.   Pleural effusion    a. L pleural eff after CABG s/p thoracentesis 08/22/12.   Prostate cancer [facility])    Status post radiation treatment.   Prostatic hypertrophy    a. Hx of urinary retention, awaiting TURP.   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 Mild  MR by TEE 08/2012.   Past Surgical History:  Past Surgical History:  Procedure Laterality Date   APPENDECTOMY     BACK SURGERY     CORONARY ARTERY BYPASS GRAFT  08/16/2012   Procedure: CORONARY ARTERY BYPASS GRAFTING (CABG);  Surgeon: Elspeth JAYSON Millers, MD;  Location: [facility] OR;  Service: Open Heart Surgery;  Laterality: N/A;   IR REPLC GASTRO/COLONIC TUBE PERCUT W/FLUORO  02/13/2019   LEFT HEART CATHETERIZATION WITH CORONARY ANGIOGRAM N/A 08/14/2012   Procedure: LEFT HEART CATHETERIZATION WITH CORONARY ANGIOGRAM;  Surgeon: Peter M Jordan, MD;  Location: [facility] CATH LAB;  Service: Cardiovascular;  Laterality: N/A;   Thank you,  Lamar Candy, CCC-SLP [phone]  Heli Dino 12/09/2024, 2:57 PM

## 2024-12-10 ENCOUNTER — Non-Acute Institutional Stay (SKILLED_NURSING_FACILITY): Admitting: Adult Health

## 2024-12-10 ENCOUNTER — Encounter: Payer: Self-pay | Admitting: Adult Health

## 2024-12-10 DIAGNOSIS — G8111 Spastic hemiplegia affecting right dominant side: Secondary | ICD-10-CM | POA: Diagnosis not present

## 2024-12-10 DIAGNOSIS — I700 Atherosclerosis of aorta: Secondary | ICD-10-CM

## 2024-12-10 DIAGNOSIS — I739 Peripheral vascular disease, unspecified: Secondary | ICD-10-CM

## 2024-12-10 NOTE — Progress Notes (Signed)
 " Location:  [facility] Room Number: South/148/D Place of Service:  SNF (31)   CODE STATUS: DNR  Allergies[1]  Chief Complaint  Patient presents with   Care Plan Meeting     HPI:  We have come together for his care plan meeting. Family present. No BIMS; mood 0/30. He is out of bed to geri-chair without falls. He requires dependent assist with his adl care. He is incontinent of bladder and bowel. Dietary: D2 diet with thin liquids, setup for meals; weight is 166 pounds without significant change; appetite is variable. Therapy: ST: has had swallow study performed. Activities in room. He had been hospitalized in Dec due with pulmonary embolus. He is now on eliquis . He will continue to be followed for his chronic illnesses including: Aortic atherosclerosis  Peripheral vascular disease   Right spastic hemiplegia  Past Medical History:  Diagnosis Date   A-fib (HCC)    a. Post-op afib after CABG 08/2012.   Acute gastric ulcer with hemorrhage    Acute respiratory failure with hypoxia (HCC)    Aphasia following cerebral infarction    CAD (coronary artery disease)    a. NSTEMI s/p stent to distal RCA 03/2000. b. Inf MI s/p emergent thrombectomy/stenting mid RCA 10/2000. c. NSTEMI s/p CABGx4 (LIMA-LAD, SVG-OM2, seq SVG-acute marginal and PD) 08/16/12 - course complicated by confusion, post-op AF, L pleural effusion with thoracentesis. d. Normal LV function by echo 08/2012.   Diverticulosis    DM with CKD    Dysphagia following cerebral infarction    Encephalopathy    Extended spectrum beta lactamase (ESBL) resistance    Generalized anxiety disorder    GERD (gastroesophageal reflux disease)    Hemiplegia and hemiparesis following cerebral infarction affecting right dominant side (HCC)    Hiatal hernia    HLD (hyperlipidemia)    HTN (hypertension)    Non-ST elevation (NSTEMI) myocardial infarction [facility])    Nontraumatic intracerebral hemorrhage in hemisphere, subcortical  (HCC)    Peripheral vascular disease    a. Carotid dopplers neg 08/13/12. b. Pre-cabg ABIs - R=0.87 suggesting mild dz, L=1.29 possibly falsely elevated due to calcified vessels.   Pleural effusion    a. L pleural eff after CABG s/p thoracentesis 08/22/12.   Prostate cancer [facility])    Status post radiation treatment.   Prostatic hypertrophy    a. Hx of urinary retention, awaiting TURP.   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 Mild  MR by TEE 08/2012.    Past Surgical History:  Procedure Laterality Date   APPENDECTOMY     BACK SURGERY     CORONARY ARTERY BYPASS GRAFT  08/16/2012   Procedure: CORONARY ARTERY BYPASS GRAFTING (CABG);  Surgeon: Elspeth JAYSON Millers, MD;  Location: [facility] OR;  Service: Open Heart Surgery;  Laterality: N/A;   IR REPLC GASTRO/COLONIC TUBE PERCUT W/FLUORO  02/13/2019   LEFT HEART CATHETERIZATION WITH CORONARY ANGIOGRAM N/A 08/14/2012   Procedure: LEFT HEART CATHETERIZATION WITH CORONARY ANGIOGRAM;  Surgeon: Peter M Jordan, MD;  Location: [facility] CATH LAB;  Service: Cardiovascular;  Laterality: N/A;    Social History   Socioeconomic History   Marital status: Married    Spouse name: Not on file   Number of children: Not on file   Years of education: Not on file   Highest education level: Not on file  Occupational History   Occupation: retired   Tobacco Use   Smoking status: Former    Current packs/day: 0.00    Types: Cigarettes    Quit date: 11/12/1972    Years since quitting: 52.1   Smokeless tobacco: Never  Vaping Use   Vaping status: Never Used  Substance and Sexual Activity   Alcohol use: No   Drug use: No   Sexual activity: Not Currently  Other Topics Concern   Not on file  Social History Narrative   He is a long term patient of [facility]    Social Drivers of Health   Tobacco Use: Medium Risk (12/10/2024)   Patient History    Smoking  Tobacco Use: Former    Smokeless Tobacco Use: Never    Passive Exposure: Not on Actuary Strain: Not on file  Food Insecurity: No Food Insecurity (11/29/2024)   Epic    Worried About Programme Researcher, Broadcasting/film/video in the Last Year: Never true    Ran Out of Food in the Last Year: Never true  Transportation Needs: No Transportation Needs (11/29/2024)   Epic    Lack of Transportation (Medical): No    Lack of Transportation (Non-Medical): No  Physical Activity: Not on file  Stress: Not on file  Social Connections: Moderately Integrated (11/29/2024)   Social Connection and Isolation Panel    Frequency of Communication with Friends and Family: Once a week    Frequency of Social Gatherings with Friends and Family: Three times a week    Attends Religious Services: 1 to 4 times per year    Active Member of Clubs or Organizations: No    Attends Banker Meetings: Never    Marital Status: Married  Catering Manager Violence: Not At Risk (11/29/2024)   Epic    Fear of Current or Ex-Partner: No    Emotionally Abused: No    Physically Abused: No    Sexually Abused: No  Depression (PHQ2-9): Low Risk (09/29/2023)   Depression (PHQ2-9)    PHQ-2 Score: 0  Alcohol Screen: Not on file  Housing: Low Risk (11/29/2024)   Epic    Unable to Pay for Housing in the Last Year: No    Number of Times Moved in the Last Year: 0    Homeless in the Last Year: No  Utilities: Not At Risk (11/29/2024)   Epic    Threatened with loss of utilities: No  Health Literacy: Not on file   Family History  Problem Relation Age of Onset   Hypertension Mother       VITAL SIGNS BP 106/64   Pulse 66   Temp 97.9 F (36.6 C)   Resp 18   Ht 6' (1.829 m)   Wt 165 lb (74.8 kg)   SpO2 98%   BMI 22.38 kg/m   Outpatient Encounter Medications as of 12/10/2024  Medication Sig   acetaminophen  (TYLENOL ) 325 MG tablet Take 325 mg by mouth 2 (two) times daily.   amLODipine  (NORVASC ) 2.5 MG tablet Take  2.5 mg by mouth daily.   apixaban  (ELIQUIS ) 5 MG TABS tablet Take 1 tablet (5 mg total) by mouth 2 (two) times daily.   Baclofen 5 MG TABS Take 5 mg by mouth 4 (four) times daily.   Balsam [place]-Castor Oil (VENELEX) OINT Apply 1 Application topically See admin instructions. Apply topicallly every shift to bilateral buttocks and sacrum   clotrimazole (LOTRIMIN) 1 % cream Apply 1 Application topically See admin instructions. Apply every  shift as needed to head of penis   Eyelid Cleansers (OCUSOFT LID SCRUB ORIGINAL) PADS Apply 1 Application topically 2 (two) times daily as needed (to clean eye.).   FLUoxetine  (PROZAC ) 10 MG capsule Take 10 mg by mouth daily.   insulin  glargine (LANTUS ) 100 UNIT/ML injection Inject 24 Units into the skin daily.   insulin  lispro (HUMALOG ) 100 UNIT/ML cartridge Inject 5 Units into the skin See admin instructions. Inject 5 units into the skin with meals.   loratadine (CLARITIN) 10 MG tablet Take 10 mg by mouth daily as needed for allergies.   losartan (COZAAR) 25 MG tablet Take 25 mg by mouth daily.   Nutritional Supplements (ENSURE ENLIVE PO) Take 1 Can by mouth 2 (two) times daily.   rosuvastatin  (CRESTOR ) 10 MG tablet Take 10 mg by mouth at bedtime.   sennosides-docusate sodium  (SENOKOT-S) 8.6-50 MG tablet Take 1 tablet by mouth in the morning and at bedtime.   Continuous Glucose Sensor (FREESTYLE LIBRE 3 SENSOR) MISC 1 Device by Does not apply route every 14 (fourteen) days. Place 1 sensor on the skin every 14 days. Use to check glucose continuously (Patient not taking: Reported on 12/10/2024)   No facility-administered encounter medications on file as of 12/10/2024.     SIGNIFICANT DIAGNOSTIC EXAMS  LABS REVIEWED PREVIOUS:      01-01-24: ACR 256 04-25-24: wbc 7.6; hgb 15.9; hct 49.9; mcv 93.8 plt 176; glucose 84; bun 32; creat 1.48 ;k+ 4.0; na++ 134; ca 9.2 gfr 48; protein 6.8 albumin  2.8; tsh 2.449; psa 0.96; hgb A1c 7.5; chol 166; ldl 114; trig 133; hdl 25    TODAY  09-26-24: hgb A1c 7.5 11-28-24; wbc 9.9; hgb 16.4; hct 50.7; mcv 95.1 plt 182; glucose 135; bun 41; creat 1.90; k+ 4.4; na++ 140; ca 9.4 gfr 35; protein 5.9 albumin  3.1 12-02-24: wbc 8.2; hgb 15.7; hct 47.0; mcv 92.7 plt 146; glucose 136; bun 24; creat 1.49; k+ 4.2; na++ 139; ca 9.5 gfr 47; albumin  3.3 phos 2.8     Review of Systems  Reason unable to perform ROS: aphasia.     Physical Exam Constitutional:      General: He is not in acute distress.    Appearance: He is well-developed. He is not diaphoretic.  Neck:     Thyroid : No thyromegaly.  Cardiovascular:     Rate and Rhythm: Normal rate and regular rhythm.     Pulses: Normal pulses.     Heart sounds: Normal heart sounds.  Pulmonary:     Effort: Pulmonary effort is normal. No respiratory distress.     Breath sounds: Normal breath sounds.  Abdominal:     General: Bowel sounds are normal. There is no distension.     Palpations: Abdomen is soft.     Tenderness: There is no abdominal tenderness.  Musculoskeletal:     Cervical back: Neck supple.     Right lower leg: No edema.     Left lower leg: No edema.     Comments: Right hemiplegia     Lymphadenopathy:     Cervical: No cervical adenopathy.  Skin:    General: Skin is warm and dry.  Neurological:     Mental Status: He is alert. Mental status is at baseline.  Psychiatric:        Mood and Affect: Mood normal.       ASSESSMENT/ PLAN:  TODAY  Aortic atherosclerosis Peripheral vascular disease Right spastic hemiplegia  Will continue current medications Will continue current plan of  care Will continue to monitor his status.  Time spent with patient: 40 minutes:ST; dysphagia; dietary; medications.    Barnie Seip NP [facility]   call [phone] =     [1] No Known Allergies  "

## 2024-12-29 ENCOUNTER — Other Ambulatory Visit (HOSPITAL_COMMUNITY)
Admission: RE | Admit: 2024-12-29 | Discharge: 2024-12-29 | Disposition: A | Discharge status: Home / Self Care | Source: Skilled Nursing Facility | Attending: Adult Health | Admitting: Adult Health

## 2024-12-29 DIAGNOSIS — R310 Gross hematuria: Secondary | ICD-10-CM | POA: Diagnosis present

## 2024-12-29 LAB — URINALYSIS, ROUTINE W REFLEX MICROSCOPIC
Bilirubin Urine: NEGATIVE
Glucose, UA: 50 mg/dL — AB
Ketones, ur: NEGATIVE mg/dL
Nitrite: NEGATIVE
Protein, ur: 100 mg/dL — AB
RBC / HPF: >50 RBC/hpf (ref 0–5)
Specific Gravity, Urine: 1.017 (ref 1.005–1.030)
WBC, UA: >50 WBC/hpf (ref 0–5)
pH: 6 (ref 5.0–8.0)

## 2024-12-30 LAB — URINE CULTURE

## 2025-01-01 ENCOUNTER — Other Ambulatory Visit (HOSPITAL_COMMUNITY)
Admission: RE | Admit: 2025-01-01 | Discharge: 2025-01-01 | Disposition: A | Discharge status: Home / Self Care | Source: Skilled Nursing Facility | Attending: Adult Health | Admitting: Adult Health

## 2025-01-01 ENCOUNTER — Encounter: Payer: Self-pay | Admitting: Adult Health

## 2025-01-01 ENCOUNTER — Non-Acute Institutional Stay (SKILLED_NURSING_FACILITY): Payer: Self-pay | Admitting: Adult Health

## 2025-01-01 DIAGNOSIS — Z86718 Personal history of other venous thrombosis and embolism: Secondary | ICD-10-CM

## 2025-01-01 DIAGNOSIS — Z794 Long term (current) use of insulin: Secondary | ICD-10-CM

## 2025-01-01 DIAGNOSIS — E1151 Type 2 diabetes mellitus with diabetic peripheral angiopathy without gangrene: Secondary | ICD-10-CM | POA: Diagnosis not present

## 2025-01-01 DIAGNOSIS — F0150 Vascular dementia without behavioral disturbance: Secondary | ICD-10-CM | POA: Diagnosis not present

## 2025-01-01 DIAGNOSIS — E113293 Type 2 diabetes mellitus with mild nonproliferative diabetic retinopathy without macular edema, bilateral: Secondary | ICD-10-CM | POA: Insufficient documentation

## 2025-01-01 DIAGNOSIS — I2699 Other pulmonary embolism without acute cor pulmonale: Secondary | ICD-10-CM | POA: Diagnosis not present

## 2025-01-01 LAB — COMPREHENSIVE METABOLIC PANEL WITH GFR
ALT: 31 U/L (ref 0–44)
AST: 36 U/L (ref 15–41)
Albumin: 3.6 g/dL (ref 3.5–5.0)
Alkaline Phosphatase: 117 U/L (ref 38–126)
Anion gap: 13 (ref 5–15)
BUN: 54 mg/dL — ABNORMAL HIGH (ref 8–23)
CO2: 26 mmol/L (ref 22–32)
Calcium: 9.9 mg/dL (ref 8.9–10.3)
Chloride: 105 mmol/L (ref 98–111)
Creatinine, Ser: 1.76 mg/dL — ABNORMAL HIGH (ref 0.61–1.24)
GFR, Estimated: 39 mL/min — ABNORMAL LOW
Glucose, Bld: 236 mg/dL — ABNORMAL HIGH (ref 70–99)
Potassium: 4.7 mmol/L (ref 3.5–5.1)
Sodium: 143 mmol/L (ref 135–145)
Total Bilirubin: 0.6 mg/dL (ref 0.0–1.2)
Total Protein: 7.1 g/dL (ref 6.5–8.1)

## 2025-01-01 LAB — HEMOGLOBIN A1C
Hgb A1c MFr Bld: 6.9 % — ABNORMAL HIGH (ref 4.8–5.6)
Mean Plasma Glucose: 151.33 mg/dL

## 2025-01-01 LAB — CBC WITH DIFFERENTIAL/PLATELET
Abs Immature Granulocytes: 0.02 K/uL (ref 0.00–0.07)
Basophils Absolute: 0 K/uL (ref 0.0–0.1)
Basophils Relative: 1 %
Eosinophils Absolute: 0.5 K/uL (ref 0.0–0.5)
Eosinophils Relative: 6 %
HCT: 52.7 % — ABNORMAL HIGH (ref 39.0–52.0)
Hemoglobin: 16.6 g/dL (ref 13.0–17.0)
Immature Granulocytes: 0 %
Lymphocytes Relative: 22 %
Lymphs Abs: 1.9 K/uL (ref 0.7–4.0)
MCH: 30.6 pg (ref 26.0–34.0)
MCHC: 31.5 g/dL (ref 30.0–36.0)
MCV: 97.2 fL (ref 80.0–100.0)
Monocytes Absolute: 0.5 K/uL (ref 0.1–1.0)
Monocytes Relative: 6 %
Neutro Abs: 5.4 K/uL (ref 1.7–7.7)
Neutrophils Relative %: 65 %
Platelets: 194 K/uL (ref 150–400)
RBC: 5.42 MIL/uL (ref 4.22–5.81)
RDW: 12.4 % (ref 11.5–15.5)
WBC: 8.4 K/uL (ref 4.0–10.5)
nRBC: 0 % (ref 0.0–0.2)

## 2025-01-01 NOTE — Progress Notes (Signed)
 " Location:  [facility] Room Number: 18 D Place of Service:  SNF (31)   CODE STATUS: DNR  Allergies[1]  Chief Complaint  Patient presents with   Medical Management of Chronic Issues          Vascular dementia without behavioral disturbance: Type 2 diabetes mellitus with peripheral vascular disease:Pulmonary embolism/history of DVT:    HPI:  He is a 79 y.o. long term resident of this facility being seen for the management of his chronic illnesses:Vascular dementia without behavioral disturbance: Type 2 diabetes mellitus with peripheral vascular disease:Pulmonary embolism/history of DVT. His appetite is poor; his family states that he has excessive thirst; and did have abdominal pain one time.    Past Medical History:  Diagnosis Date   A-fib [facility])    a. Post-op afib after CABG 08/2012.   Acute gastric ulcer with hemorrhage    Acute respiratory failure with hypoxia (HCC)    Aphasia following cerebral infarction    CAD (coronary artery disease)    a. NSTEMI s/p stent to distal RCA 03/2000. b. Inf MI s/p emergent thrombectomy/stenting mid RCA 10/2000. c. NSTEMI s/p CABGx4 (LIMA-LAD, SVG-OM2, seq SVG-acute marginal and PD) 08/16/12 - course complicated by confusion, post-op AF, L pleural effusion with thoracentesis. d. Normal LV function by echo 08/2012.   Diverticulosis    DM with CKD    Dysphagia following cerebral infarction    Encephalopathy    Extended spectrum beta lactamase (ESBL) resistance    Generalized anxiety disorder    GERD (gastroesophageal reflux disease)    Hemiplegia and hemiparesis following cerebral infarction affecting right dominant side (HCC)    Hiatal hernia    HLD (hyperlipidemia)    HTN (hypertension)    Non-ST elevation (NSTEMI) myocardial infarction [facility])    Nontraumatic intracerebral hemorrhage in hemisphere, subcortical (HCC)    Peripheral vascular disease    a. Carotid dopplers neg 08/13/12. b. Pre-cabg ABIs - R=0.87 suggesting mild  dz, L=1.29 possibly falsely elevated due to calcified vessels.   Pleural effusion    a. L pleural eff after CABG s/p thoracentesis 08/22/12.   Prostate cancer [facility])    Status post radiation treatment.   Prostatic hypertrophy    a. Hx of urinary retention, awaiting TURP.   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 Mild  MR by TEE 08/2012.    Past Surgical History:  Procedure Laterality Date   APPENDECTOMY     BACK SURGERY     CORONARY ARTERY BYPASS GRAFT  08/16/2012   Procedure: CORONARY ARTERY BYPASS GRAFTING (CABG);  Surgeon: Elspeth JAYSON Millers, MD;  Location: [facility] OR;  Service: Open Heart Surgery;  Laterality: N/A;   IR REPLC GASTRO/COLONIC TUBE PERCUT W/FLUORO  02/13/2019   LEFT HEART CATHETERIZATION WITH CORONARY ANGIOGRAM N/A 08/14/2012   Procedure: LEFT HEART CATHETERIZATION WITH CORONARY ANGIOGRAM;  Surgeon: Peter M Jordan, MD;  Location: [facility] CATH LAB;  Service: Cardiovascular;  Laterality: N/A;    Social History   Socioeconomic History   Marital status: Married    Spouse name: Not on file   Number of children: Not on file   Years of education: Not on file   Highest education level: Not on file  Occupational History   Occupation: retired   Tobacco Use   Smoking status: Former    Current packs/day: 0.00  Types: Cigarettes    Quit date: 11/12/1972    Years since quitting: 52.1   Smokeless tobacco: Never  Vaping Use   Vaping status: Never Used  Substance and Sexual Activity   Alcohol use: No   Drug use: No   Sexual activity: Not Currently  Other Topics Concern   Not on file  Social History Narrative   He is a long term patient of [facility]    Social Drivers of Health   Tobacco Use: Medium Risk (01/01/2025)   Patient History    Smoking Tobacco Use: Former    Smokeless Tobacco Use: Never    Passive Exposure: Not on Actuary Strain: Not on  file  Food Insecurity: No Food Insecurity (11/29/2024)   Epic    Worried About Programme Researcher, Broadcasting/film/video in the Last Year: Never true    Ran Out of Food in the Last Year: Never true  Transportation Needs: No Transportation Needs (11/29/2024)   Epic    Lack of Transportation (Medical): No    Lack of Transportation (Non-Medical): No  Physical Activity: Not on file  Stress: Not on file  Social Connections: Moderately Integrated (11/29/2024)   Social Connection and Isolation Panel    Frequency of Communication with Friends and Family: Once a week    Frequency of Social Gatherings with Friends and Family: Three times a week    Attends Religious Services: 1 to 4 times per year    Active Member of Clubs or Organizations: No    Attends Banker Meetings: Never    Marital Status: Married  Catering Manager Violence: Not At Risk (11/29/2024)   Epic    Fear of Current or Ex-Partner: No    Emotionally Abused: No    Physically Abused: No    Sexually Abused: No  Depression (PHQ2-9): Low Risk (09/29/2023)   Depression (PHQ2-9)    PHQ-2 Score: 0  Alcohol Screen: Not on file  Housing: Low Risk (11/29/2024)   Epic    Unable to Pay for Housing in the Last Year: No    Number of Times Moved in the Last Year: 0    Homeless in the Last Year: No  Utilities: Not At Risk (11/29/2024)   Epic    Threatened with loss of utilities: No  Health Literacy: Not on file   Family History  Problem Relation Age of Onset   Hypertension Mother       VITAL SIGNS BP 132/68   Pulse 72   Temp 98.1 F (36.7 C)   Resp 18   Ht 6' (1.829 m)   Wt 164 lb 8 oz (74.6 kg)   SpO2 95%   BMI 22.31 kg/m   Outpatient Encounter Medications as of 01/01/2025  Medication Sig   acetaminophen  (TYLENOL ) 325 MG tablet Take 325 mg by mouth 2 (two) times daily.   amLODipine  (NORVASC ) 2.5 MG tablet Take 2.5 mg by mouth daily.   apixaban  (ELIQUIS ) 5 MG TABS tablet Take 1 tablet (5 mg total) by mouth 2 (two) times daily.    Baclofen 5 MG TABS Take 5 mg by mouth 4 (four) times daily.   Balsam [place]-Castor Oil (VENELEX) OINT Apply 1 Application topically See admin instructions. Apply topicallly every shift to bilateral buttocks and sacrum   clotrimazole (LOTRIMIN) 1 % cream Apply 1 Application topically See admin instructions. Apply every shift as needed to head of penis   EMBECTA AUTOSHIELD DUO 30G X 5 MM MISC    Eyelid Cleansers (  OCUSOFT LID SCRUB ORIGINAL) PADS Apply 1 Application topically 2 (two) times daily as needed (to clean eye.).   FLUoxetine  (PROZAC ) 10 MG capsule Take 10 mg by mouth daily.   insulin  glargine (LANTUS ) 100 UNIT/ML injection Inject 24 Units into the skin daily.   insulin  lispro (HUMALOG ) 100 UNIT/ML cartridge Inject 5 Units into the skin See admin instructions. Inject 5 units into the skin with meals.   loratadine (CLARITIN) 10 MG tablet Take 10 mg by mouth daily as needed for allergies.   losartan (COZAAR) 25 MG tablet Take 25 mg by mouth daily.   mirtazapine (REMERON) 7.5 MG tablet Take 7.5 mg by mouth at bedtime. Special Instructions: for appetite At Bedtime 08:00 PM   Nutritional Supplements (ENSURE ENLIVE PO) Take by mouth. 0.08 gram-1.5 kcal/mL; oral Special Instructions: May use any brand of liquid nutrition supplement Twice A Day Between Meals 10:00 AM, 02:00 PM   rosuvastatin  (CRESTOR ) 10 MG tablet Take 10 mg by mouth at bedtime.   sennosides-docusate sodium  (SENOKOT-S) 8.6-50 MG tablet Take 1 tablet by mouth in the morning and at bedtime.   Continuous Glucose Sensor (FREESTYLE LIBRE 3 SENSOR) MISC 1 Device by Does not apply route every 14 (fourteen) days. Place 1 sensor on the skin every 14 days. Use to check glucose continuously (Patient not taking: Reported on 01/01/2025)   No facility-administered encounter medications on file as of 01/01/2025.     SIGNIFICANT DIAGNOSTIC EXAMS  LABS REVIEWED PREVIOUS:      01-01-24: ACR 256 04-25-24: wbc 7.6; hgb 15.9; hct 49.9; mcv 93.8  plt 176; glucose 84; bun 32; creat 1.48 ;k+ 4.0; na++ 134; ca 9.2 gfr 48; protein 6.8 albumin  2.8; tsh 2.449; psa 0.96; hgb A1c 7.5; chol 166; ldl 114; trig 133; hdl 25   TODAY  09-26-24: hgb A1c 7.5 11-28-24; wbc 9.9; hgb 16.4; hct 50.7; mcv 95.1 plt 182; glucose 135; bun 41; creat 1.90; k+ 4.4; na++ 140; ca 9.4 gfr 35; protein 5.9 albumin  3.1 12-02-24: wbc 8.2; hgb 15.7; hct 47.0; mcv 92.7 plt 146; glucose 136; bun 24; creat 1.49; k+ 4.2; na++ 139; ca 9.5 gfr 47; albumin  3.3 phos 2.8   12-29-24: urine culture: multiple species    Review of Systems  Reason unable to perform ROS: nonverbal; is aware.     Physical Exam Constitutional:      General: He is not in acute distress.    Appearance: He is well-developed. He is not diaphoretic.  Neck:     Thyroid : No thyromegaly.  Cardiovascular:     Rate and Rhythm: Normal rate and regular rhythm.     Heart sounds: Normal heart sounds.  Pulmonary:     Effort: Pulmonary effort is normal. No respiratory distress.     Breath sounds: Normal breath sounds.  Abdominal:     General: Bowel sounds are normal. There is no distension.     Palpations: Abdomen is soft.     Tenderness: There is no abdominal tenderness.  Musculoskeletal:     Cervical back: Neck supple.     Right lower leg: No edema.     Left lower leg: No edema.     Comments: Right hemiplegia     Lymphadenopathy:     Cervical: No cervical adenopathy.  Skin:    General: Skin is warm and dry.  Neurological:     Mental Status: He is alert. Mental status is at baseline.  Psychiatric:        Mood and Affect: Mood normal.  ASSESSMENT/ PLAN:  TODAY:  Vascular dementia without behavioral disturbance: weight is 164 pounds. He has no appetite at this time. Will begin remeron 7.5 mg nightly for 30 days to help with appetite. At this time his weight remains stable.   2.   Type 2 diabetes mellitus with peripheral vascular disease: hgb A1c 7.5 will continue humalog  5 units with meals  twice daily; lantus  24 units nightly is on statin and arb . He is having excessive thirst. He has had some elevated cbg readings. Will repeat labs and will adjust medications as indicated.   3.Pulmonary embolism/history of DVT: will continue eliquis  5 mg twice daily   PREVIOUS   4. Chronic generalized pain: will continue tylenol  650 mg twice daily   5. Aortic atherosclerosis (ct 01-04-19) is on statin  6. CAD native heart without angina: s/p cabg 2012; cozaar 25 mg daily     7. Hypertension associated with type 2 diabetes mellitus: b/p 132/68 :will continue cozaar 25 mg daily and norvasc  2.5 mg daily asa 81 mg daily   8. Osteopenia: t scoe -1.421 is on supplements  9. Dyslipidemia associated with type 2 diabetes mellitus: LDL 114 will continue crestor  10 mg daily   10. Micro-albuminuria due to type 2 diabetes mellitus: 382.3 is on ARB .  11. Major depression with psychotic features: is taking prozac  10 mg daily   12. Chronic constipation: is on senna s twice daily   13. Chronic urine retention/bladder spasms: foley is out; will monitor   14. GERD without esophagitis: is now off PPI   15. Chronic non-seasonal allergic rhinitis: will continue claritin 10 mg daily   16. CKD stage 3 due to type 2 diabetes mellitus: bun 24; creat 1.49; gfr 47  17. Protein calorie malnutrition severe: albumin  3.3 will continue supplements as directed  18. History of nontraumatic subcortical hemorrhage of left cerebral hemisphere/right spastic hemiplegia (05-22-21); will continue baclofen 5 mg four times daily   19. Restless leg syndrome: is presently not on medications.     Barnie Seip NP [facility]  call [phone]      [1] No Known Allergies  "

## 2025-01-03 ENCOUNTER — Other Ambulatory Visit (HOSPITAL_COMMUNITY)
Admission: RE | Admit: 2025-01-03 | Discharge: 2025-01-03 | Disposition: A | Source: Skilled Nursing Facility | Attending: Adult Health | Admitting: Adult Health

## 2025-01-03 DIAGNOSIS — I152 Hypertension secondary to endocrine disorders: Secondary | ICD-10-CM | POA: Insufficient documentation

## 2025-01-03 LAB — BASIC METABOLIC PANEL WITH GFR
Anion gap: 11 (ref 5–15)
BUN: 42 mg/dL — ABNORMAL HIGH (ref 8–23)
CO2: 24 mmol/L (ref 22–32)
Calcium: 9.3 mg/dL (ref 8.9–10.3)
Chloride: 107 mmol/L (ref 98–111)
Creatinine, Ser: 1.49 mg/dL — ABNORMAL HIGH (ref 0.61–1.24)
GFR, Estimated: 47 mL/min — ABNORMAL LOW
Glucose, Bld: 79 mg/dL (ref 70–99)
Potassium: 4.1 mmol/L (ref 3.5–5.1)
Sodium: 142 mmol/L (ref 135–145)
